# Patient Record
Sex: Female | Born: 1955
Health system: Southern US, Community
[De-identification: ages and names within clinical notes are randomized; demographics above are authoritative.]

## PROBLEM LIST (undated history)

## (undated) DIAGNOSIS — E063 Autoimmune thyroiditis: Secondary | ICD-10-CM

## (undated) DIAGNOSIS — M069 Rheumatoid arthritis, unspecified: Secondary | ICD-10-CM

## (undated) DIAGNOSIS — E039 Hypothyroidism, unspecified: Secondary | ICD-10-CM

## (undated) DIAGNOSIS — I8390 Asymptomatic varicose veins of unspecified lower extremity: Secondary | ICD-10-CM

## (undated) DIAGNOSIS — E7800 Pure hypercholesterolemia, unspecified: Secondary | ICD-10-CM

## (undated) DIAGNOSIS — R011 Cardiac murmur, unspecified: Secondary | ICD-10-CM

## (undated) DIAGNOSIS — K219 Gastro-esophageal reflux disease without esophagitis: Secondary | ICD-10-CM

## (undated) DIAGNOSIS — D67 Hereditary factor IX deficiency: Secondary | ICD-10-CM

## (undated) DIAGNOSIS — I10 Essential (primary) hypertension: Secondary | ICD-10-CM

## (undated) DIAGNOSIS — M1990 Unspecified osteoarthritis, unspecified site: Secondary | ICD-10-CM

## (undated) DIAGNOSIS — J439 Emphysema, unspecified: Secondary | ICD-10-CM

## (undated) DIAGNOSIS — R0600 Dyspnea, unspecified: Secondary | ICD-10-CM

## (undated) HISTORY — PX: ENDOSCOPIC VEIN LASER TREATMENT: SHX1508

## (undated) HISTORY — PX: ELBOW SURGERY: SHX618

## (undated) HISTORY — PX: APPENDECTOMY: SHX54

## (undated) HISTORY — PX: HAND SURGERY: SHX662

## (undated) HISTORY — PX: TUBAL LIGATION: SHX77

## (undated) HISTORY — PX: CHOLECYSTECTOMY: SHX55

## (undated) HISTORY — PX: HEEL SPUR SURGERY: SHX665

## (undated) HISTORY — PX: EXPLORATORY LAPAROTOMY: SUR591

## (undated) HISTORY — PX: TONSILLECTOMY: SUR1361

---

## 1998-08-07 ENCOUNTER — Other Ambulatory Visit: Admission: RE | Admit: 1998-08-07 | Discharge: 1998-08-07 | Payer: Self-pay | Admitting: Obstetrics & Gynecology

## 2006-02-12 ENCOUNTER — Encounter (INDEPENDENT_AMBULATORY_CARE_PROVIDER_SITE_OTHER): Payer: Self-pay | Admitting: Interventional Cardiology

## 2006-02-12 ENCOUNTER — Inpatient Hospital Stay (HOSPITAL_COMMUNITY): Admission: EM | Admit: 2006-02-12 | Discharge: 2006-02-14 | Payer: Self-pay | Admitting: *Deleted

## 2006-02-18 ENCOUNTER — Ambulatory Visit: Payer: Self-pay | Admitting: Family Medicine

## 2006-04-02 ENCOUNTER — Ambulatory Visit: Payer: Self-pay | Admitting: Family Medicine

## 2006-06-13 ENCOUNTER — Ambulatory Visit: Payer: Self-pay | Admitting: Family Medicine

## 2006-10-10 ENCOUNTER — Ambulatory Visit: Payer: Self-pay | Admitting: Internal Medicine

## 2007-03-25 ENCOUNTER — Ambulatory Visit: Payer: Self-pay | Admitting: Family Medicine

## 2007-04-06 ENCOUNTER — Ambulatory Visit: Payer: Self-pay | Admitting: Family Medicine

## 2007-08-11 ENCOUNTER — Telehealth (INDEPENDENT_AMBULATORY_CARE_PROVIDER_SITE_OTHER): Payer: Self-pay | Admitting: *Deleted

## 2007-08-18 DIAGNOSIS — E039 Hypothyroidism, unspecified: Secondary | ICD-10-CM | POA: Insufficient documentation

## 2007-08-20 ENCOUNTER — Ambulatory Visit: Payer: Self-pay | Admitting: Family Medicine

## 2007-08-27 ENCOUNTER — Telehealth (INDEPENDENT_AMBULATORY_CARE_PROVIDER_SITE_OTHER): Payer: Self-pay | Admitting: Nurse Practitioner

## 2007-10-01 ENCOUNTER — Encounter (INDEPENDENT_AMBULATORY_CARE_PROVIDER_SITE_OTHER): Payer: Self-pay | Admitting: Nurse Practitioner

## 2007-10-01 ENCOUNTER — Ambulatory Visit: Payer: Self-pay | Admitting: Family Medicine

## 2007-10-01 LAB — CONVERTED CEMR LAB
Alkaline Phosphatase: 95 units/L (ref 39–117)
BUN: 13 mg/dL (ref 6–23)
CO2: 24 meq/L (ref 19–32)
Calcium: 8.9 mg/dL (ref 8.4–10.5)
Creatinine, Ser: 0.66 mg/dL (ref 0.40–1.20)
HCT: 42.4 % (ref 36.0–46.0)
LDL Cholesterol: 100 mg/dL — ABNORMAL HIGH (ref 0–99)
Lymphocytes Relative: 17 % (ref 12–46)
Lymphs Abs: 1.2 10*3/uL (ref 0.7–3.3)
Neutro Abs: 5.2 10*3/uL (ref 1.7–7.7)
Neutrophils Relative %: 72 % (ref 43–77)
Platelets: 227 10*3/uL (ref 150–400)
Potassium: 4.4 meq/L (ref 3.5–5.3)
Total Bilirubin: 0.4 mg/dL (ref 0.3–1.2)
Total CHOL/HDL Ratio: 4.2
Total Protein: 7.5 g/dL (ref 6.0–8.3)
VLDL: 45 mg/dL — ABNORMAL HIGH (ref 0–40)
WBC: 7.2 10*3/uL (ref 4.0–10.5)

## 2007-10-02 ENCOUNTER — Encounter (INDEPENDENT_AMBULATORY_CARE_PROVIDER_SITE_OTHER): Payer: Self-pay | Admitting: Nurse Practitioner

## 2007-10-29 ENCOUNTER — Ambulatory Visit: Payer: Self-pay | Admitting: Vascular Surgery

## 2007-10-29 ENCOUNTER — Emergency Department (HOSPITAL_COMMUNITY): Admission: EM | Admit: 2007-10-29 | Discharge: 2007-10-29 | Payer: Self-pay | Admitting: Emergency Medicine

## 2007-10-29 ENCOUNTER — Encounter (INDEPENDENT_AMBULATORY_CARE_PROVIDER_SITE_OTHER): Payer: Self-pay | Admitting: Emergency Medicine

## 2007-10-29 LAB — CONVERTED CEMR LAB
BUN: 14 mg/dL
CO2: 27 meq/L
Chloride: 102 meq/L
Glucose, Bld: 86 mg/dL
Platelets: 247 10*3/uL
Sodium: 133 meq/L

## 2007-11-02 ENCOUNTER — Ambulatory Visit: Payer: Self-pay | Admitting: Nurse Practitioner

## 2007-11-02 DIAGNOSIS — I8310 Varicose veins of unspecified lower extremity with inflammation: Secondary | ICD-10-CM | POA: Insufficient documentation

## 2007-11-03 LAB — CONVERTED CEMR LAB: TSH: 73.884 microintl units/mL — ABNORMAL HIGH (ref 0.350–5.50)

## 2007-11-04 ENCOUNTER — Telehealth (INDEPENDENT_AMBULATORY_CARE_PROVIDER_SITE_OTHER): Payer: Self-pay | Admitting: Nurse Practitioner

## 2007-12-07 ENCOUNTER — Encounter (INDEPENDENT_AMBULATORY_CARE_PROVIDER_SITE_OTHER): Payer: Self-pay | Admitting: *Deleted

## 2008-05-04 ENCOUNTER — Ambulatory Visit: Payer: Self-pay | Admitting: Internal Medicine

## 2008-05-04 ENCOUNTER — Ambulatory Visit (HOSPITAL_COMMUNITY): Admission: RE | Admit: 2008-05-04 | Discharge: 2008-05-04 | Payer: Self-pay | Admitting: Internal Medicine

## 2008-05-04 DIAGNOSIS — E669 Obesity, unspecified: Secondary | ICD-10-CM | POA: Insufficient documentation

## 2008-05-04 DIAGNOSIS — M25529 Pain in unspecified elbow: Secondary | ICD-10-CM | POA: Insufficient documentation

## 2008-05-04 DIAGNOSIS — J309 Allergic rhinitis, unspecified: Secondary | ICD-10-CM | POA: Insufficient documentation

## 2008-05-04 DIAGNOSIS — I10 Essential (primary) hypertension: Secondary | ICD-10-CM | POA: Insufficient documentation

## 2008-05-04 DIAGNOSIS — R32 Unspecified urinary incontinence: Secondary | ICD-10-CM | POA: Insufficient documentation

## 2008-05-04 DIAGNOSIS — K219 Gastro-esophageal reflux disease without esophagitis: Secondary | ICD-10-CM | POA: Insufficient documentation

## 2008-05-04 LAB — CONVERTED CEMR LAB: Monocyte/Macrophage: 8 % — ABNORMAL LOW (ref 50–90)

## 2008-05-05 ENCOUNTER — Ambulatory Visit: Payer: Self-pay | Admitting: Internal Medicine

## 2008-05-05 ENCOUNTER — Telehealth (INDEPENDENT_AMBULATORY_CARE_PROVIDER_SITE_OTHER): Payer: Self-pay | Admitting: *Deleted

## 2008-05-06 ENCOUNTER — Telehealth (INDEPENDENT_AMBULATORY_CARE_PROVIDER_SITE_OTHER): Payer: Self-pay | Admitting: *Deleted

## 2008-05-06 ENCOUNTER — Ambulatory Visit: Payer: Self-pay | Admitting: Internal Medicine

## 2008-05-11 ENCOUNTER — Encounter (INDEPENDENT_AMBULATORY_CARE_PROVIDER_SITE_OTHER): Payer: Self-pay | Admitting: Internal Medicine

## 2008-05-18 ENCOUNTER — Ambulatory Visit: Payer: Self-pay | Admitting: Internal Medicine

## 2008-05-19 ENCOUNTER — Ambulatory Visit: Payer: Self-pay | Admitting: Internal Medicine

## 2008-05-23 LAB — CONVERTED CEMR LAB
Basophils Relative: 0 % (ref 0–1)
CRP: 0.4 mg/dL (ref <0.6)
Eosinophils Absolute: 0.2 10*3/uL (ref 0.0–0.7)
Lymphocytes Relative: 16 % (ref 12–46)
Lymphs Abs: 1.2 10*3/uL (ref 0.7–4.0)
MCV: 105.1 fL — ABNORMAL HIGH (ref 78.0–100.0)
Neutrophils Relative %: 73 % (ref 43–77)
Platelets: 219 10*3/uL (ref 150–400)
RBC: 3.7 M/uL — ABNORMAL LOW (ref 3.87–5.11)
RDW: 15.1 % (ref 11.5–15.5)
Rhuematoid fact SerPl-aCnc: <20 intl units/mL (ref 0–20)
Uric Acid, Serum: 3.9 mg/dL (ref 2.4–7.0)

## 2008-06-20 ENCOUNTER — Encounter (INDEPENDENT_AMBULATORY_CARE_PROVIDER_SITE_OTHER): Payer: Self-pay | Admitting: Internal Medicine

## 2008-06-23 ENCOUNTER — Encounter: Admission: RE | Admit: 2008-06-23 | Discharge: 2008-06-23 | Payer: Self-pay | Admitting: Rheumatology

## 2008-07-05 ENCOUNTER — Encounter (INDEPENDENT_AMBULATORY_CARE_PROVIDER_SITE_OTHER): Payer: Self-pay | Admitting: Internal Medicine

## 2008-07-06 ENCOUNTER — Encounter (INDEPENDENT_AMBULATORY_CARE_PROVIDER_SITE_OTHER): Payer: Self-pay | Admitting: Internal Medicine

## 2008-07-08 ENCOUNTER — Ambulatory Visit: Payer: Self-pay | Admitting: Internal Medicine

## 2008-07-08 DIAGNOSIS — E538 Deficiency of other specified B group vitamins: Secondary | ICD-10-CM | POA: Insufficient documentation

## 2008-07-08 DIAGNOSIS — M069 Rheumatoid arthritis, unspecified: Secondary | ICD-10-CM | POA: Insufficient documentation

## 2008-07-21 ENCOUNTER — Ambulatory Visit: Payer: Self-pay | Admitting: Internal Medicine

## 2008-07-21 DIAGNOSIS — R609 Edema, unspecified: Secondary | ICD-10-CM | POA: Insufficient documentation

## 2008-07-21 LAB — CONVERTED CEMR LAB
Glucose, Urine, Semiquant: NEGATIVE
Nitrite: NEGATIVE
Specific Gravity, Urine: 1.02
pH: 7

## 2008-07-22 LAB — CONVERTED CEMR LAB
ALT: 12 units/L (ref 0–35)
AST: 13 units/L (ref 0–37)
Alkaline Phosphatase: 72 units/L (ref 39–117)
CO2: 23 meq/L (ref 19–32)
Calcium: 8.5 mg/dL (ref 8.4–10.5)
Creatinine, Ser: 0.74 mg/dL (ref 0.40–1.20)
Eosinophils Relative: 2 % (ref 0–5)
HCT: 40.5 % (ref 36.0–46.0)
Lymphocytes Relative: 20 % (ref 12–46)
Lymphs Abs: 1.7 10*3/uL (ref 0.7–4.0)
Monocytes Absolute: 0.7 10*3/uL (ref 0.1–1.0)
Monocytes Relative: 8 % (ref 3–12)
Neutro Abs: 5.7 10*3/uL (ref 1.7–7.7)
Neutrophils Relative %: 70 % (ref 43–77)
Platelets: 207 10*3/uL (ref 150–400)
RDW: 15.8 % — ABNORMAL HIGH (ref 11.5–15.5)
TSH: 37.647 microintl units/mL — ABNORMAL HIGH (ref 0.350–4.50)
Total Protein: 6.8 g/dL (ref 6.0–8.3)
WBC: 8.3 10*3/uL (ref 4.0–10.5)

## 2008-07-25 ENCOUNTER — Encounter (INDEPENDENT_AMBULATORY_CARE_PROVIDER_SITE_OTHER): Payer: Self-pay | Admitting: Internal Medicine

## 2008-08-03 ENCOUNTER — Encounter (INDEPENDENT_AMBULATORY_CARE_PROVIDER_SITE_OTHER): Payer: Self-pay | Admitting: Internal Medicine

## 2008-08-09 ENCOUNTER — Ambulatory Visit: Payer: Self-pay | Admitting: Internal Medicine

## 2008-08-23 ENCOUNTER — Encounter (INDEPENDENT_AMBULATORY_CARE_PROVIDER_SITE_OTHER): Payer: Self-pay | Admitting: Internal Medicine

## 2008-08-30 ENCOUNTER — Encounter (INDEPENDENT_AMBULATORY_CARE_PROVIDER_SITE_OTHER): Payer: Self-pay | Admitting: Internal Medicine

## 2008-09-06 ENCOUNTER — Ambulatory Visit: Payer: Self-pay | Admitting: Internal Medicine

## 2008-09-06 DIAGNOSIS — R5381 Other malaise: Secondary | ICD-10-CM | POA: Insufficient documentation

## 2008-09-06 DIAGNOSIS — R5383 Other fatigue: Secondary | ICD-10-CM

## 2008-09-28 ENCOUNTER — Encounter (INDEPENDENT_AMBULATORY_CARE_PROVIDER_SITE_OTHER): Payer: Self-pay | Admitting: Internal Medicine

## 2008-10-11 ENCOUNTER — Ambulatory Visit: Payer: Self-pay | Admitting: Internal Medicine

## 2008-10-12 ENCOUNTER — Encounter (INDEPENDENT_AMBULATORY_CARE_PROVIDER_SITE_OTHER): Payer: Self-pay | Admitting: Internal Medicine

## 2008-10-13 ENCOUNTER — Encounter (INDEPENDENT_AMBULATORY_CARE_PROVIDER_SITE_OTHER): Payer: Self-pay | Admitting: Internal Medicine

## 2008-10-17 ENCOUNTER — Telehealth (INDEPENDENT_AMBULATORY_CARE_PROVIDER_SITE_OTHER): Payer: Self-pay | Admitting: *Deleted

## 2008-10-17 LAB — CONVERTED CEMR LAB
BUN: 12 mg/dL (ref 6–23)
Calcium: 8.9 mg/dL (ref 8.4–10.5)
Creatinine, Ser: 0.6 mg/dL (ref 0.40–1.20)
Free T4: 1.39 ng/dL (ref 0.89–1.80)
Glucose, Bld: 98 mg/dL (ref 70–99)
Potassium: 3.9 meq/L (ref 3.5–5.3)

## 2008-11-08 ENCOUNTER — Encounter (INDEPENDENT_AMBULATORY_CARE_PROVIDER_SITE_OTHER): Payer: Self-pay | Admitting: Internal Medicine

## 2008-11-09 ENCOUNTER — Encounter: Admission: RE | Admit: 2008-11-09 | Discharge: 2008-11-09 | Payer: Self-pay | Admitting: Rheumatology

## 2008-11-09 ENCOUNTER — Encounter (INDEPENDENT_AMBULATORY_CARE_PROVIDER_SITE_OTHER): Payer: Self-pay | Admitting: Internal Medicine

## 2008-12-21 ENCOUNTER — Ambulatory Visit: Payer: Self-pay | Admitting: Internal Medicine

## 2008-12-21 DIAGNOSIS — R6882 Decreased libido: Secondary | ICD-10-CM | POA: Insufficient documentation

## 2009-01-05 ENCOUNTER — Encounter (INDEPENDENT_AMBULATORY_CARE_PROVIDER_SITE_OTHER): Payer: Self-pay | Admitting: Internal Medicine

## 2009-01-06 ENCOUNTER — Encounter: Admission: RE | Admit: 2009-01-06 | Discharge: 2009-01-06 | Payer: Self-pay | Admitting: Rheumatology

## 2009-01-17 ENCOUNTER — Ambulatory Visit: Payer: Self-pay | Admitting: Internal Medicine

## 2009-01-19 ENCOUNTER — Encounter (INDEPENDENT_AMBULATORY_CARE_PROVIDER_SITE_OTHER): Payer: Self-pay | Admitting: Internal Medicine

## 2009-01-19 LAB — CONVERTED CEMR LAB
TSH: 0.022 microintl units/mL — ABNORMAL LOW (ref 0.350–4.50)
Vitamin B-12: 216 pg/mL (ref 211–911)

## 2009-02-06 ENCOUNTER — Encounter (INDEPENDENT_AMBULATORY_CARE_PROVIDER_SITE_OTHER): Payer: Self-pay | Admitting: Internal Medicine

## 2009-02-16 ENCOUNTER — Ambulatory Visit: Payer: Self-pay | Admitting: Internal Medicine

## 2009-03-07 ENCOUNTER — Encounter (INDEPENDENT_AMBULATORY_CARE_PROVIDER_SITE_OTHER): Payer: Self-pay | Admitting: Internal Medicine

## 2009-03-20 ENCOUNTER — Telehealth (INDEPENDENT_AMBULATORY_CARE_PROVIDER_SITE_OTHER): Payer: Self-pay | Admitting: Internal Medicine

## 2009-03-20 ENCOUNTER — Ambulatory Visit: Payer: Self-pay | Admitting: Internal Medicine

## 2009-03-21 ENCOUNTER — Encounter (INDEPENDENT_AMBULATORY_CARE_PROVIDER_SITE_OTHER): Payer: Self-pay | Admitting: Internal Medicine

## 2009-04-17 ENCOUNTER — Encounter (INDEPENDENT_AMBULATORY_CARE_PROVIDER_SITE_OTHER): Payer: Self-pay | Admitting: Internal Medicine

## 2009-04-21 ENCOUNTER — Ambulatory Visit: Payer: Self-pay | Admitting: Internal Medicine

## 2009-06-20 ENCOUNTER — Ambulatory Visit: Payer: Self-pay | Admitting: Internal Medicine

## 2009-06-21 ENCOUNTER — Encounter (INDEPENDENT_AMBULATORY_CARE_PROVIDER_SITE_OTHER): Payer: Self-pay | Admitting: Internal Medicine

## 2009-06-21 LAB — CONVERTED CEMR LAB
ALT: 12 units/L (ref 0–35)
Albumin: 4.1 g/dL (ref 3.5–5.2)
Alkaline Phosphatase: 114 units/L (ref 39–117)
Basophils Relative: 0 % (ref 0–1)
CO2: 22 meq/L (ref 19–32)
Calcium: 9.6 mg/dL (ref 8.4–10.5)
Creatinine, Ser: 0.53 mg/dL (ref 0.40–1.20)
Eosinophils Absolute: 0.1 10*3/uL (ref 0.0–0.7)
Free T4: 2.27 ng/dL — ABNORMAL HIGH (ref 0.80–1.80)
HCT: 45.9 % (ref 36.0–46.0)
Lymphocytes Relative: 21 % (ref 12–46)
MCV: 97.9 fL (ref 78.0–100.0)
Neutrophils Relative %: 64 % (ref 43–77)
Potassium: 4.3 meq/L (ref 3.5–5.3)
RDW: 14.6 % (ref 11.5–15.5)
Sed Rate: 11 mm/hr (ref 0–22)
TSH: 0.022 microintl units/mL — ABNORMAL LOW (ref 0.350–4.500)
Total Bilirubin: 0.6 mg/dL (ref 0.3–1.2)
WBC: 6 10*3/uL (ref 4.0–10.5)

## 2009-06-22 ENCOUNTER — Encounter: Admission: RE | Admit: 2009-06-22 | Discharge: 2009-06-22 | Payer: Self-pay | Admitting: Rheumatology

## 2009-08-22 ENCOUNTER — Encounter (INDEPENDENT_AMBULATORY_CARE_PROVIDER_SITE_OTHER): Payer: Self-pay | Admitting: Internal Medicine

## 2009-09-19 ENCOUNTER — Emergency Department (HOSPITAL_COMMUNITY): Admission: EM | Admit: 2009-09-19 | Discharge: 2009-09-19 | Payer: Self-pay | Admitting: Emergency Medicine

## 2009-10-30 ENCOUNTER — Emergency Department (HOSPITAL_COMMUNITY): Admission: EM | Admit: 2009-10-30 | Discharge: 2009-10-31 | Payer: Self-pay | Admitting: Emergency Medicine

## 2011-01-15 NOTE — Progress Notes (Signed)
Summary: [name]   Imported By: Lutricia Horsfall LPN 16/09/9603 54:09:81  _____________________________________________________________________  External Attachment:    Type:   Image     Comment:   External Document

## 2011-03-21 LAB — DIFFERENTIAL
Eosinophils Absolute: 0 10*3/uL (ref 0.0–0.7)
Eosinophils Relative: 0 % (ref 0–5)
Lymphocytes Relative: 9 % — ABNORMAL LOW (ref 12–46)
Lymphs Abs: 0.8 10*3/uL (ref 0.7–4.0)
Monocytes Absolute: 1.1 10*3/uL — ABNORMAL HIGH (ref 0.1–1.0)

## 2011-03-21 LAB — LIPASE, BLOOD: Lipase: 27 U/L (ref 11–59)

## 2011-03-21 LAB — HEPATIC FUNCTION PANEL
ALT: 15 U/L (ref 0–35)
Alkaline Phosphatase: 71 U/L (ref 39–117)
Bilirubin, Direct: 0.2 mg/dL (ref 0.0–0.3)
Indirect Bilirubin: 0.7 mg/dL (ref 0.3–0.9)
Total Bilirubin: 0.9 mg/dL (ref 0.3–1.2)

## 2011-03-21 LAB — BASIC METABOLIC PANEL
BUN: 14 mg/dL (ref 6–23)
Chloride: 96 mEq/L (ref 96–112)
GFR calc non Af Amer: >60 mL/min (ref >60)
Glucose, Bld: 121 mg/dL — ABNORMAL HIGH (ref 70–99)
Potassium: 2.3 mEq/L — CL (ref 3.5–5.1)
Sodium: 129 mEq/L — ABNORMAL LOW (ref 135–145)

## 2011-03-21 LAB — URINALYSIS, ROUTINE W REFLEX MICROSCOPIC: Glucose, UA: NEGATIVE mg/dL

## 2011-03-21 LAB — CBC
HCT: 45.4 % (ref 36.0–46.0)
Hemoglobin: 15.8 g/dL — ABNORMAL HIGH (ref 12.0–15.0)
MCV: 94.3 fL (ref 78.0–100.0)
Platelets: 146 10*3/uL — ABNORMAL LOW (ref 150–400)
WBC: 8.8 10*3/uL (ref 4.0–10.5)

## 2011-03-21 LAB — URINE MICROSCOPIC-ADD ON

## 2011-03-21 LAB — URINE CULTURE: Colony Count: >=100000

## 2011-05-03 NOTE — Discharge Summary (Signed)
[name], [name]              ACCOUNT NO.:  [id number]   MEDICAL RECORD NO.:  [id number]          PATIENT TYPE:  INP   LOCATION:  2620                         FACILITY:  [facility]   PHYSICIAN:  [name] Ridgel, MD  DATE OF BIRTH:  [birth year]   DATE OF ADMISSION:  02/11/2006  DATE OF DISCHARGE:  02/14/2006                                 DISCHARGE SUMMARY   DISCHARGE DIAGNOSES:  1.  Presumed pericarditis.  Patient was admitted and ruled out for      myocardial infarction on cardiac enzymes.  She was, however, noted to      have a very slight PR depression on her electrocardiogram with an      elevated erythrocyte sedimentation rate (ESR), and no other source for      her chest pain other than a possible pericardial inflammation.  The pain      responded favorably to antiinflammatory medications and, therefore,      without other obvious causes, this is presumed mild pericarditis.  The      patient was sent home to be treated with a total of seven days of Motrin      therapy 400 mg every six hours.  She was prophylaxed with Omeprazole for      gastrointestinal upset and instructed to take her medications with food.  2.  Enlarged thyroid gland with thyroid function studies consistent with      hypothyroidism.  The patient was started on Synthroid.  An ultrasound of      her thyroid was completed that showed no obvious nodularity and,      therefore, it is presumed that this is a primary hypothyroidism.  I have      recommended that she establish a primary care physician and follow up as      an outpatient for further workup of her hypothyroidism and titration of      her medication.  3.  Central lobular emphysema.  The patient was treated with nebulizers and      a flutter valve.  She had good relief of her congestion and a sensation      of shortness of breath.  It was recommended that she stop smoking.  4.  Lack of primary care physician.  The patient was seen by Case Management    and given a referral for [facility].  She needs to establish with a      primary care practitioner to follow up on her symptomatology and to      assure that someone follows her hypothyroidism.   CHIEF COMPLAINT AT TIME OF ADMISSION:  Severe chest pain.   MEDICATIONS ON DISCHARGE:  1.  Motrin 400 mg every 6 hours with food for 5 more days then stop.  2.  Omeprazole 20 mg once daily.  3.  Synthroid 25 mcg once daily.   She is instructed to establish a primary care physician of her choice.   HISTORY OF PRESENT ILLNESS:  The patient is a very pleasant, 55 year old  white female, who is not following with a physician.  She does have  a  diagnosis of hypertension but has been off medications for at least one  year.  She is obese and does smoke cigarettes.  The patient presented to the  emergency room with severe chest pain that was relieved with leaning  forward.  It was located in the right lower rib area.  It had some vague  radiation to her neck but was prominent when she would lie down and, in  fact, she refused to lie down the first 24 hours.  With treatment with  Toradol, within 24 hours she was able to lean slightly backwards.  With  further treatment with NSAIDs, by the time of discharge she was able to lie  almost flat.  As stated, workup with cardiac markers showed no obvious  myocardial infarction.  Her D-dimer was elevated at 0.55 and therefore a  chest CT was completed, which was negative for pulmonary embolus.  A two-D  echo was completed, which showed a trivial posterior pericardial effusion  that was mentioned in the formal report but not initially noted.  She had an  ejection fraction of 75 to 80%.  There were no wall motion abnormalities.  Her ESR was noted to be elevated and with the constellation of symptoms of  elevated ESR, her chest pain relieved by sitting forward relieved with  NSAIDs, the diagnosis of mild pericarditis was made.  I did review this case  with  Cardiology, who concurred and did not feel that a consult was necessary  at this time.  Should her symptoms continue or become worse then a  Cardiology consult obviously would have been obtained.  The patient  responded quite favorably to further NSAID treatment and on the day of  discharge, as stated, was able to lie flat.   The patient's hospital course was further complicated by the notation of an  enlarged thyroid gland on chest CT.  Ultrasound of the thyroid showed  homogenous enlargement without obvious nodularity.  Her thyroid function  studies returned showing frank hypothyroidism with a high TSH and a low T4.  She therefore was started on Synthroid.  A GI source for her discomfort was  evaluated by obtaining an ultrasound of the abdomen, which showed only mild  hepatosplenomegaly without any obvious gallbladder disease and no pancreas  disease was noted.   On the day of discharge since the patient had significant resolution of her  symptoms with NSAID treatment, the diagnosis of pericarditis was made.   PHYSICAL EXAMINATION ON DISCHARGE:  VITAL SIGNS:  She was afebrile.  Her  temperature was 98.1, blood pressure 107/65, pulse was 64, respirations were  14, saturations were 94%.  GENERAL:  This is an obese white female in no acute distress.  HEENT:  She is normocephalic and atraumatic.  Pupils are equal, round, and  reactive to light.  Extraocular muscles were intact.  Mucous membranes were  moist.  NECK:  Supple.  There was no JVD, no lymph glands, no carotid bruits.  She  did have thyromegaly that was smooth.  CHEST:  Decreased but clear without rhonchi, rales, or wheezes.  CARDIOVASCULAR:  Regular rate and rhythm, positive S1 and S2, no S3 or S4,  no murmurs, rubs, or gallops.  ABDOMEN:  Obese, nontender, nondistended.  She had only trace tenderness to  palpation in the epigastric region. EXTREMITIES:  No clubbing, cyanosis, or edema.  NEUROLOGIC:  Cranial nerves II-XII  were intact.  Power was 5/5.  Deep tendon  reflexes were 2+.  PERTINENT LABORATORY VALUES:  Her ESR was slightly elevated at 62, her TSH  was high at 68.968, her free T4 was low at 0.55, her total T3 was 71.9.  Her  cardiac markers were within normal limits.  Her D-dimer was slightly  elevated at 0.55.  Her discharging CBC was 6.1 with a hemoglobin of 11.3,  hematocrit 32.8, platelets of 221.  Her BUN and creatinine at discharge were  13 and 0.8 respectively.   At this time, the patient is deemed stable for discharge to follow up as an  outpatient.  She was encouraged to please make an appointment in the  following week with [facility] to reevaluate her symptoms and to assure  that her Synthroid was being taken and up titrated appropriately.   At the time of discharge, the patient is stable.   DISPOSITION:  To home.      [name] Ridgel, MD  Electronically Signed    MLT/MEDQ  D:  02/15/2006  T:  02/16/2006  Job:  [id number]   cc:   [facility]

## 2011-05-03 NOTE — H&P (Signed)
NAMESELIN, [name] NO.:  [id number]   MEDICAL RECORD NO.:  [id number]          PATIENT TYPE:  INP   LOCATION:  1823                         FACILITY:  [facility]   PHYSICIAN:  Deirdre Peer. Polite, M.D. DATE OF BIRTH:  02-20-1956   DATE OF ADMISSION:  02/11/2006  DATE OF DISCHARGE:                                HISTORY & PHYSICAL   CHIEF COMPLAINT:  Chest pain,.   HISTORY OF PRESENT ILLNESS:  This 55 year old female with a history of  hypertension has been off medicines for one year, obesity, tobacco abuse.  She comes to the ED with complaints of chest pain.  According to the  patient, the pain has been going on since about lunch with her daughter-in-  law Monday afternoon.  The pain has been going on consistently and described  as an 8/10 on the left side of the chest and some vague radiation towards  her neck.  The patient states symptoms got worse by taking a deep breath.  The patient states her symptoms also worsened by trying to lay back.  The  pain is essentially at the point now where she essentially stays in a seated  position.  The patient does admit to tobacco abuse.  She denies any prior  history of cardiac disease.  She states that the symptoms she has now feels  similar to the way she felt when she had pneumonia back in 2005.  Because of  the above symptoms, the patient presented to the emergency department for  evaluation.  The patient is hemodynamically stable and afebrile.  CBC is  within normal limits.  Mildly elevated D-dimer at 0.55.  CT of the chest is  negative for PE.  It did show lower lobe atelectasis  and mild peribronchial  thickening.  [facility] Hospital[facility] called for further evaluation.  At the time  of my evaluation, the patient is still in discomfort in her chest and it is  easily reproducible.  The patient denies any trauma to her chest wall, any  lifting or no obvious rash.  Again denies any history of any coronary artery  disease.  Denies  any fever, chills or productive cough.  Because of the  persistent nature of the patient's chest pain admission is deemed necessary  for further evaluation and treatment.   PAST MEDICAL HISTORY:  As stated above.   MEDICATIONS:  None on admission.   SOCIAL HISTORY:  Positive for tobacco, one pack per day.  No alcohol and no  drugs.   PAST SURGICAL HISTORY:  Appendectomy, tubal ligation and tonsils removed.   ALLERGIES:  She reports an allergy to penicillin which causes a rash as well  as Tylox.   FAMILY HISTORY:  Mother deceased secondary to kidney failure.  Father had  lung cancer.  No brothers or sisters.   REVIEW OF SYSTEMS:  As stated in the HPI.   PHYSICAL EXAMINATION:  GENERAL:  Alert and oriented x3 in moderate distress  secondary to chest pain.  HEENT:  Within normal limits.  CHEST:  Moderate air movement without rales or rhonchi.  CARDIOVASCULAR:  Regular.  No S3.  The patient has reproducible chest pain  over the sternal area.  ABDOMEN:  Abdominal exam is limited as the patient stated that she was  unable to lay in a recumbent position secondary to increase in her chest  pain and a sensation of not being able to breathe.  EXTREMITIES:  No edema.  NEUROLOGICAL:  Exam is nonfocal.   LABORATORY DATA:  CBC:  White count 10.3, hemoglobin 12, MCV 37.3, platelets  260,000.  Neutrophil count 79%.  D-dimer is 0.55.  Myoglobin 53, CK-MB 1.1.  Troponin less than 0.05.  B-MET:  Sodium 128, potassium 3.8, chloride 97,  carbon dioxide 26, BUN 8, creatinine 0.8.  AST and ALT 15 and 13,  respectively.  Chest x-ray:  Bilateral lower lung atelectasis, right greater  than left, mild peribronchial thickening.  CT of the chest:  Ectatic  ascending aorta.  No PE, no dissection.  Enlarged thyroid.  Mild  hepatosplenomegaly and 8 mm right lower lobe lung nodule.   ASSESSMENT:  1.  Chest pain, reproducible with palpation.  Please note limited exam      secondary to discomfort in lying  position.  2.  History of hypertension.  Please note off medicines x1 year.  3.  History of tobacco abuse.  4.  Obesity.   PLAN:  1.  Recommend patient be admitted to telemetry floor bed.  2.  The patient will have serial cardiac enzymes.  3.  Will provide analgesia.  4.  Consider further evaluation, i.e., musculoskeletal versus cardiac.      Currently seems somewhat atypical for cardiac at this time.  5.  Will determine next step in evaluation pending on the results of the      above studies.      Deirdre Peer. Polite, M.D.  Electronically Signed     RDP/MEDQ  D:  02/12/2006  T:  02/12/2006  Job:  [id number]

## 2011-09-24 LAB — CBC
MCHC: 33.8
Platelets: 247
RDW: 13.9

## 2011-09-24 LAB — POCT CARDIAC MARKERS
CKMB, poc: 5.4
Myoglobin, poc: 89
Operator id: 198171

## 2011-09-24 LAB — DIFFERENTIAL
Basophils Absolute: 0
Basophils Relative: 1
Lymphocytes Relative: 19
Monocytes Absolute: 0.5
Neutro Abs: 5.4
Neutrophils Relative %: 71

## 2011-09-24 LAB — I-STAT 8, (EC8 V) (CONVERTED LAB)
BUN: 14
Chloride: 102
Glucose, Bld: 86
Hemoglobin: 14.6
Operator id: 198171
pCO2, Ven: 43.4 — ABNORMAL LOW

## 2011-09-24 LAB — URINE MICROSCOPIC-ADD ON

## 2011-09-24 LAB — URINALYSIS, ROUTINE W REFLEX MICROSCOPIC
Nitrite: NEGATIVE
Specific Gravity, Urine: 1.013
Urobilinogen, UA: 0.2
pH: 7

## 2012-03-26 ENCOUNTER — Encounter (HOSPITAL_COMMUNITY): Payer: Self-pay | Admitting: Emergency Medicine

## 2012-03-26 ENCOUNTER — Emergency Department (HOSPITAL_COMMUNITY)
Admission: EM | Admit: 2012-03-26 | Discharge: 2012-03-27 | Disposition: A | Discharge status: Home / Self Care | Payer: Self-pay | Attending: Emergency Medicine | Admitting: Emergency Medicine

## 2012-03-26 ENCOUNTER — Emergency Department (HOSPITAL_COMMUNITY): Payer: Self-pay

## 2012-03-26 DIAGNOSIS — F17200 Nicotine dependence, unspecified, uncomplicated: Secondary | ICD-10-CM | POA: Insufficient documentation

## 2012-03-26 DIAGNOSIS — M069 Rheumatoid arthritis, unspecified: Secondary | ICD-10-CM | POA: Insufficient documentation

## 2012-03-26 DIAGNOSIS — M79643 Pain in unspecified hand: Secondary | ICD-10-CM

## 2012-03-26 DIAGNOSIS — M79609 Pain in unspecified limb: Secondary | ICD-10-CM | POA: Insufficient documentation

## 2012-03-26 DIAGNOSIS — W1809XA Striking against other object with subsequent fall, initial encounter: Secondary | ICD-10-CM | POA: Insufficient documentation

## 2012-03-26 HISTORY — DX: Rheumatoid arthritis, unspecified: M06.9

## 2012-03-26 MED ORDER — HYDROCODONE-ACETAMINOPHEN 5-325 MG PO TABS
1.0000 | ORAL_TABLET | Freq: Once | ORAL | Status: AC
  Administered 2012-03-26: 1 via ORAL
  Filled 2012-03-26: qty 1

## 2012-03-26 NOTE — ED Notes (Signed)
Patient states she fell Tuesday morning and her right hand has hurt ever since.

## 2012-03-26 NOTE — ED Provider Notes (Signed)
History   This chart was scribed for [employer]. Colon Branch, MD by Brooks Sailors. The patient was seen in room APA06/APA06. Patient's care was started at 2224.   CSN: [id number]  Arrival date & time 03/26/12  2224   First MD Initiated Contact with Patient 03/26/12 2333      Chief Complaint  Patient presents with  . Hand Injury    (Consider location/radiation/quality/duration/timing/severity/associated sxs/prior treatment) HPI  [name] is a 56 y.o. female who presents to the Emergency Department complaining of constant moderate right hand pain onset two days ago. Patient was getting out of bed when she fell bracing herself against the wall and hitting right hand. Patient has been taking Tylenol and Ibuprofen at home. Patient with history of arthritis and permanent deformity of right index finger. Patient with history of rheumatoid arthritis and thyroid disease.   No PCP   Past Medical History  Diagnosis Date  . Thyroid disease   . Rheumatoid arthritis     History reviewed. No pertinent past surgical history.  No family history on file.  History  Substance Use Topics  . Smoking status: Current Everyday Smoker -- 0.5 packs/day    Types: Cigarettes  . Smokeless tobacco: Not on file  . Alcohol Use: No    OB History    Grav Para Term Preterm Abortions TAB SAB Ect Mult Living                  Review of Systems  All other systems reviewed and are negative.  ROS: Statement: All systems negative except as marked or noted in the HPI; Constitutional: Negative for fever and chills. ; ; Eyes: Negative for eye pain, redness and discharge. ; ; ENMT: Negative for ear pain, hoarseness, nasal congestion, sinus pressure and sore throat. ; ; Cardiovascular: Negative for chest pain, palpitations, diaphoresis, dyspnea and peripheral edema. ; ; Respiratory: Negative for cough, wheezing and stridor. ; ; Gastrointestinal: Negative for nausea, vomiting, diarrhea, abdominal pain, blood in  stool, hematemesis, jaundice and rectal bleeding. . ; ; Genitourinary: Negative for dysuria, flank pain and hematuria. ; ; Musculoskeletal: Negative for back pain and neck pain. Negative for swelling and trauma.; ; Skin: Negative for pruritus, rash, abrasions, blisters, bruising and skin lesion.; ; Neuro: Negative for headache, lightheadedness and neck stiffness. Negative for weakness, altered level of consciousness , altered mental status, extremity weakness, paresthesias, involuntary movement, seizure and syncope.      Allergies  Oxycodone-acetaminophen; Penicillins; and Tylox  Home Medications  No current outpatient prescriptions on file.  BP 108/81  Pulse 72  Temp(Src) 98 F (36.7 C) (Oral)  Resp 20  Ht 5\' 4"  (1.626 m)  Wt 250 lb (113.399 kg)  BMI 42.91 kg/m2  SpO2 99%  Physical Exam  Nursing note and vitals reviewed. Constitutional: She is oriented to person, place, and time. She appears well-developed and well-nourished.  HENT:  Head: Normocephalic and atraumatic.        slightly thick tongue.  Eyes: Conjunctivae and EOM are normal. Pupils are equal, round, and reactive to light.       Bilateral periorbital edema, mild proptosis.   Neck: Normal range of motion. Neck supple.  Cardiovascular: Normal rate, regular rhythm and normal heart sounds.  Exam reveals no gallop and no friction rub.   No murmur heard. Pulmonary/Chest: Effort normal and breath sounds normal. No respiratory distress. She has no wheezes. She has no rales.  Abdominal: Soft. Bowel sounds are normal.  Musculoskeletal:  Swans neck deformity of theright index  digit. Swelling over metacarpals and dorsum of the right hand. Bilateral pulses present, Cap refill is brisk.   Neurological: She is alert and oriented to person, place, and time.  Skin: Skin is warm and dry.  Psychiatric: She has a normal mood and affect.    ED Course  Procedures (including critical care time) DIAGNOSTIC STUDIES: Oxygen  Saturation is 99% on room air, normal by my interpretation.    COORDINATION OF CARE: 11:35PM Patient informed of current plan for treatment and evaluation and agrees with plan at this time. Patient had hand x-rays taken upon arrival.   Dg Hand Complete Right  03/26/2012  *RADIOLOGY REPORT*  Clinical Data: Status post fall, with injury to the right hand and wrist.  History of severe arthritis and bony fusion at the second digit.  RIGHT HAND - COMPLETE 3+ VIEW  Comparison: None.  Findings: There is no evidence of acute fracture or dislocation. Marked chronic erosive change is noted at the second and third metacarpophalangeal joints.  There is also marked chronic deformity involving the second digit.  Findings are compatible with relatively focal erosive arthritis.  Remaining visualized joint spaces are preserved.  The carpal rows demonstrate multiple erosions, but are otherwise unremarkable in appearance.  Soft tissue swelling is noted about the metacarpophalangeal joints.  IMPRESSION:  1.  No evidence of acute fracture or dislocation. 2.  Marked chronic erosive change at the second and third metacarpophalangeal joints, and marked chronic deformity involving the second digit, with scattered erosions at the carpal rows. Findings compatible with relatively focal erosive arthritis; per clinical correlation, this reflects rheumatoid arthritis. 3.  Soft tissue swelling about the metacarpophalangeal joints.  Original Report Authenticated By: Tonia Ghent, M.D.       MDM  Patient with persistent right hand pain after a fall at home on Tuesday. X-rays without evidence of acute fracture. Patient was given analgesics and placed in a cock-up splint.Pt stable in ED with no significant deterioration in condition.The patient appears reasonably screened and/or stabilized for discharge and I doubt any other medical condition or other [facility] requiring further screening, evaluation, or treatment in the ED at this time prior  to discharge.  I personally performed the services described in this documentation, which was scribed in my presence. The recorded information has been reviewed and considered.   MDM Reviewed: nursing note and vitals Interpretation: x-ray           Nicoletta Dress. Colon Branch, MD 03/27/12 0100

## 2012-03-27 MED ORDER — HYDROCODONE-ACETAMINOPHEN 5-325 MG PO TABS: 1.0000 | ORAL_TABLET | ORAL | Status: AC | PRN

## 2012-03-27 NOTE — Discharge Instructions (Signed)
Your x-rays do not show any broken bones. Use ibuprofen and a stronger pain medicine for her discomfort. Use the splint for comfort. You may use either heat or ice, whichever feels better.

## 2012-09-01 ENCOUNTER — Other Ambulatory Visit (HOSPITAL_COMMUNITY): Payer: Self-pay | Admitting: Physician Assistant

## 2012-09-01 DIAGNOSIS — Z139 Encounter for screening, unspecified: Secondary | ICD-10-CM

## 2012-09-08 ENCOUNTER — Ambulatory Visit (HOSPITAL_COMMUNITY): Payer: Self-pay

## 2012-10-05 ENCOUNTER — Inpatient Hospital Stay (HOSPITAL_COMMUNITY): Admission: RE | Admit: 2012-10-05 | Payer: Self-pay | Source: Ambulatory Visit

## 2013-04-15 ENCOUNTER — Encounter (HOSPITAL_COMMUNITY): Payer: Self-pay | Admitting: Emergency Medicine

## 2013-04-15 ENCOUNTER — Emergency Department (HOSPITAL_COMMUNITY): Payer: Self-pay

## 2013-04-15 ENCOUNTER — Emergency Department (HOSPITAL_COMMUNITY)
Admission: EM | Admit: 2013-04-15 | Discharge: 2013-04-15 | Disposition: A | Discharge status: Home / Self Care | Payer: Self-pay | Attending: Emergency Medicine | Admitting: Emergency Medicine

## 2013-04-15 DIAGNOSIS — G8929 Other chronic pain: Secondary | ICD-10-CM | POA: Insufficient documentation

## 2013-04-15 DIAGNOSIS — Z91040 Latex allergy status: Secondary | ICD-10-CM | POA: Insufficient documentation

## 2013-04-15 DIAGNOSIS — M1711 Unilateral primary osteoarthritis, right knee: Secondary | ICD-10-CM

## 2013-04-15 DIAGNOSIS — Z8639 Personal history of other endocrine, nutritional and metabolic disease: Secondary | ICD-10-CM | POA: Insufficient documentation

## 2013-04-15 DIAGNOSIS — Z862 Personal history of diseases of the blood and blood-forming organs and certain disorders involving the immune mechanism: Secondary | ICD-10-CM | POA: Insufficient documentation

## 2013-04-15 DIAGNOSIS — Z880 Allergy status to penicillin: Secondary | ICD-10-CM | POA: Insufficient documentation

## 2013-04-15 DIAGNOSIS — IMO0002 Reserved for concepts with insufficient information to code with codable children: Secondary | ICD-10-CM | POA: Insufficient documentation

## 2013-04-15 DIAGNOSIS — M1710 Unilateral primary osteoarthritis, unspecified knee: Secondary | ICD-10-CM | POA: Insufficient documentation

## 2013-04-15 DIAGNOSIS — M549 Dorsalgia, unspecified: Secondary | ICD-10-CM | POA: Insufficient documentation

## 2013-04-15 DIAGNOSIS — M069 Rheumatoid arthritis, unspecified: Secondary | ICD-10-CM | POA: Insufficient documentation

## 2013-04-15 DIAGNOSIS — F17200 Nicotine dependence, unspecified, uncomplicated: Secondary | ICD-10-CM | POA: Insufficient documentation

## 2013-04-15 HISTORY — DX: Pure hypercholesterolemia, unspecified: E78.00

## 2013-04-15 MED ORDER — FENTANYL CITRATE 0.05 MG/ML IJ SOLN
50.0000 ug | Freq: Once | INTRAMUSCULAR | Status: AC
  Administered 2013-04-15: 50 ug via INTRAMUSCULAR
  Filled 2013-04-15: qty 2

## 2013-04-15 MED ORDER — ONDANSETRON HCL 4 MG PO TABS
4.0000 mg | ORAL_TABLET | Freq: Once | ORAL | Status: AC
  Administered 2013-04-15: 4 mg via ORAL
  Filled 2013-04-15: qty 1

## 2013-04-15 MED ORDER — HYDROCODONE-ACETAMINOPHEN 5-325 MG PO TABS
2.0000 | ORAL_TABLET | Freq: Once | ORAL | Status: AC
  Administered 2013-04-15: 2 via ORAL
  Filled 2013-04-15: qty 2

## 2013-04-15 MED ORDER — DEXAMETHASONE SODIUM PHOSPHATE 4 MG/ML IJ SOLN
8.0000 mg | Freq: Once | INTRAMUSCULAR | Status: AC
  Administered 2013-04-15: 8 mg via INTRAMUSCULAR
  Filled 2013-04-15: qty 1

## 2013-04-15 MED ORDER — DEXAMETHASONE 4 MG PO TABS: ORAL_TABLET | ORAL | Status: DC

## 2013-04-15 MED ORDER — HYDROCODONE-ACETAMINOPHEN 7.5-325 MG PO TABS: 1.0000 | ORAL_TABLET | ORAL | Status: DC | PRN

## 2013-04-15 MED ORDER — IBUPROFEN 800 MG PO TABS
800.0000 mg | ORAL_TABLET | Freq: Once | ORAL | Status: AC
  Administered 2013-04-15: 800 mg via ORAL
  Filled 2013-04-15: qty 1

## 2013-04-15 NOTE — ED Notes (Signed)
edp aware pain meds did not work.

## 2013-04-15 NOTE — ED Notes (Signed)
Pt c/o chronic r knee pain. Started throbbing yesterday. Got up this am and could not walk due to pain. Nausea due to pain. Pain from knee down to ankle per pt.

## 2013-04-15 NOTE — ED Provider Notes (Signed)
History     CSN: [id number]  Arrival date & time 04/15/13  0909   First MD Initiated Contact with Patient 04/15/13 [id number]      Chief Complaint  Patient presents with  . Knee Pain    (Consider location/radiation/quality/duration/timing/severity/associated sxs/prior treatment) Patient is a 57 y.o. female presenting with knee pain. The history is provided by the patient.  Knee Pain Location:  Knee Time since incident:  1 day Injury: no   Knee location:  R knee Pain details:    Quality:  Aching and throbbing   Radiates to: right hip.   Severity:  Severe   Onset quality:  Sudden   Timing:  Constant   Progression:  Worsening Chronicity:  Chronic Dislocation: no   Foreign body present:  No foreign bodies Prior injury to area:  No Relieved by:  Nothing Worsened by:  Bearing weight Ineffective treatments:  Acetaminophen Associated symptoms: back pain, decreased ROM, stiffness and swelling   Associated symptoms: no neck pain   Risk factors: obesity     Past Medical History  Diagnosis Date  . Thyroid disease   . Rheumatoid arthritis   . Hypercholesteremia     Past Surgical History  Procedure Laterality Date  . Hand surgery    . Tubal ligation    . Tonsillectomy    . Heel spur surgery    . Cholecystectomy    . Elbow surgery    . Exploratory laparotomy      History reviewed. No pertinent family history.  History  Substance Use Topics  . Smoking status: Current Every Day Smoker -- 0.50 packs/day    Types: Cigarettes  . Smokeless tobacco: Not on file  . Alcohol Use: No    OB History   Grav Para Term Preterm Abortions TAB SAB Ect Mult Living                  Review of Systems  Constitutional: Negative for activity change.       All ROS Neg except as noted in HPI  HENT: Negative for nosebleeds and neck pain.   Eyes: Negative for photophobia and discharge.  Respiratory: Negative for cough, shortness of breath and wheezing.   Cardiovascular: Negative for  chest pain and palpitations.  Gastrointestinal: Negative for abdominal pain and blood in stool.  Genitourinary: Negative for dysuria, frequency and hematuria.  Musculoskeletal: Positive for back pain, arthralgias and stiffness.  Skin: Negative.   Neurological: Negative for dizziness, seizures and speech difficulty.  Psychiatric/Behavioral: Negative for hallucinations and confusion.    Allergies  Latex; Oxycodone-acetaminophen; and Penicillins  Home Medications   Current Outpatient Rx  Name  Route  Sig  Dispense  Refill  . acetaminophen (TYLENOL) 325 MG tablet   Oral   Take 650 mg by mouth daily as needed for pain.         . Aspirin-Acetaminophen-Caffeine (EXCEDRIN PO)   Oral   Take 4 tablets by mouth daily.           BP 165/88  Pulse 77  Temp(Src) 98.3 F (36.8 C) (Oral)  Resp 22  Ht 5\' 3"  (1.6 m)  Wt 233 lb (105.688 kg)  BMI 41.28 kg/m2  SpO2 96%  Physical Exam  Nursing note and vitals reviewed. Constitutional: She is oriented to person, place, and time. She appears well-developed and well-nourished.  Non-toxic appearance.  HENT:  Head: Normocephalic.  Right Ear: Tympanic membrane and external ear normal.  Left Ear: Tympanic membrane and external ear normal.  Eyes: EOM and lids are normal. Pupils are equal, round, and reactive to light.  Neck: Normal range of motion. Neck supple. Carotid bruit is not present.  Cardiovascular: Normal rate, regular rhythm, normal heart sounds, intact distal pulses and normal pulses.   Pulmonary/Chest: Breath sounds normal. No respiratory distress.  Abdominal: Soft. Bowel sounds are normal. There is no tenderness. There is no guarding.  Musculoskeletal: Normal range of motion.  There is good range of motion of both shoulders. Good range of motion of both elbows. There is soreness with range of motion of the wrist. There are degenerative joint disease changes of the wrist and fingers of both hands. In particular there is a swan-neck  deformity of the fingers.  There is decreased range of motion of both hips. The patient would not cooperate for examination of the knees do to pain. There is some swelling and questionable effusion present particularly of the right knee. The right knee is warm to touch. There is no pitting edema present but there is some puffiness of the lower extremities. The dorsalis pedis pulses 2+ bilaterally. The Achilles tendon is intact bilaterally.  Lymphadenopathy:       Head (right side): No submandibular adenopathy present.       Head (left side): No submandibular adenopathy present.    She has no cervical adenopathy.  Neurological: She is alert and oriented to person, place, and time. She has normal strength. No cranial nerve deficit or sensory deficit.  Skin: Skin is warm and dry.  Psychiatric: She has a normal mood and affect. Her speech is normal.    ED Course  Procedures (including critical care time)  Labs Reviewed - No data to display No results found.   No diagnosis found.    MDM  I have reviewed nursing notes, vital signs, and all appropriate lab and imaging results for this patient. Pt has right leg pain and swelling. Xray of the right femur reveals extensive DJD present. No occult fx. Plan - appply heat. Use decadron and norco for pain. Pt to see orthopedics MD for follow up and recheck.       Kathie Dike, PA-C 04/19/13 2211

## 2013-04-21 NOTE — ED Provider Notes (Signed)
Medical screening examination/treatment/procedure(s) were performed by non-physician practitioner and as supervising physician I was immediately available for consultation/collaboration.  Tyheim Vanalstyne R. Kindal Ponti, MD 04/21/13 0804 

## 2013-08-17 ENCOUNTER — Other Ambulatory Visit (HOSPITAL_COMMUNITY): Payer: Self-pay | Admitting: Physician Assistant

## 2013-08-17 DIAGNOSIS — Z139 Encounter for screening, unspecified: Secondary | ICD-10-CM

## 2013-09-13 ENCOUNTER — Ambulatory Visit (HOSPITAL_COMMUNITY): Payer: Self-pay

## 2013-10-07 ENCOUNTER — Emergency Department (HOSPITAL_COMMUNITY)
Admission: EM | Admit: 2013-10-07 | Discharge: 2013-10-07 | Disposition: A | Discharge status: Home / Self Care | Payer: Self-pay | Attending: Emergency Medicine | Admitting: Emergency Medicine

## 2013-10-07 ENCOUNTER — Encounter (HOSPITAL_COMMUNITY): Payer: Self-pay | Admitting: Emergency Medicine

## 2013-10-07 DIAGNOSIS — IMO0002 Reserved for concepts with insufficient information to code with codable children: Secondary | ICD-10-CM | POA: Insufficient documentation

## 2013-10-07 DIAGNOSIS — F17200 Nicotine dependence, unspecified, uncomplicated: Secondary | ICD-10-CM | POA: Insufficient documentation

## 2013-10-07 DIAGNOSIS — M069 Rheumatoid arthritis, unspecified: Secondary | ICD-10-CM

## 2013-10-07 DIAGNOSIS — Z880 Allergy status to penicillin: Secondary | ICD-10-CM | POA: Insufficient documentation

## 2013-10-07 DIAGNOSIS — Z79899 Other long term (current) drug therapy: Secondary | ICD-10-CM | POA: Insufficient documentation

## 2013-10-07 DIAGNOSIS — Z8639 Personal history of other endocrine, nutritional and metabolic disease: Secondary | ICD-10-CM | POA: Insufficient documentation

## 2013-10-07 DIAGNOSIS — Z91040 Latex allergy status: Secondary | ICD-10-CM | POA: Insufficient documentation

## 2013-10-07 DIAGNOSIS — Z862 Personal history of diseases of the blood and blood-forming organs and certain disorders involving the immune mechanism: Secondary | ICD-10-CM | POA: Insufficient documentation

## 2013-10-07 DIAGNOSIS — Z9889 Other specified postprocedural states: Secondary | ICD-10-CM | POA: Insufficient documentation

## 2013-10-07 HISTORY — DX: Hypothyroidism, unspecified: E03.9

## 2013-10-07 MED ORDER — PREDNISONE 50 MG PO TABS
60.0000 mg | ORAL_TABLET | Freq: Once | ORAL | Status: AC
  Administered 2013-10-07: 60 mg via ORAL
  Filled 2013-10-07 (×2): qty 1

## 2013-10-07 MED ORDER — HYDROMORPHONE HCL PF 2 MG/ML IJ SOLN
2.0000 mg | Freq: Once | INTRAMUSCULAR | Status: AC
  Administered 2013-10-07: 2 mg via INTRAMUSCULAR
  Filled 2013-10-07: qty 1

## 2013-10-07 MED ORDER — ONDANSETRON 4 MG PO TBDP
4.0000 mg | ORAL_TABLET | Freq: Once | ORAL | Status: AC
Start: 2013-10-07 — End: 2013-10-07
  Administered 2013-10-07: 4 mg via ORAL
  Filled 2013-10-07: qty 1

## 2013-10-07 MED ORDER — HYDROCODONE-ACETAMINOPHEN 5-325 MG PO TABS: 1.0000 | ORAL_TABLET | Freq: Four times a day (QID) | ORAL | Status: DC | PRN

## 2013-10-07 MED ORDER — PREDNISONE 10 MG PO TABS: 40.0000 mg | ORAL_TABLET | Freq: Every day | ORAL | Status: DC

## 2013-10-07 NOTE — ED Notes (Signed)
Pt has not had tylenol or ibuprofen within the last 24 hours. Per pt "they just don't work." Pt resting on stretcher. Continues to rate pain 9/10. Son at bedside. Awaiting provider evaluation.

## 2013-10-07 NOTE — ED Provider Notes (Signed)
CSN: [id number]     Arrival date & time 10/07/13  1717 History   First MD Initiated Contact with Patient 10/07/13 1925     Chief Complaint  Patient presents with  . Joint Pain   (Consider location/radiation/quality/duration/timing/severity/associated sxs/prior Treatment) The history is provided by the patient, the EMS personnel and a relative.   57 year old female brought in by EMS for acute joint pain. Patient's had no fall or injuries. Started 2 days ago right breast right knee worse with movement consistent with her rheumatoid arthritis. Last significant flare was in the spring. That was successfully treated with prednisone and narcotic pain medication. Patient [facility]. Patient has been seen by rheumatology in the past has had workup and was told that the she has rheumatoid arthritis no history of gout. Current pain is 10 out of 10 localized to the right wrist and the right knee. No other systemic symptoms.  Past Medical History  Diagnosis Date  . Rheumatoid arthritis(714.0)   . Hypercholesteremia   . Hypothyroid    Past Surgical History  Procedure Laterality Date  . Hand surgery    . Tubal ligation    . Tonsillectomy    . Heel spur surgery    . Cholecystectomy    . Elbow surgery    . Exploratory laparotomy     No family history on file. History  Substance Use Topics  . Smoking status: Current Every Day Smoker -- 1.00 packs/day for 40 years    Types: Cigarettes  . Smokeless tobacco: Never Used  . Alcohol Use: No   OB History   Grav Para Term Preterm Abortions TAB SAB Ect Mult Living                 Review of Systems  Constitutional: Negative for fever.  HENT: Negative for congestion.   Eyes: Negative for redness.  Respiratory: Negative for shortness of breath.   Cardiovascular: Negative for chest pain.  Gastrointestinal: Negative for abdominal pain.  Genitourinary: Negative for dysuria.  Musculoskeletal: Negative for arthralgias.   Skin: Negative for rash and wound.  Neurological: Negative for headaches.  Hematological: Does not bruise/bleed easily.  Psychiatric/Behavioral: Negative for confusion.    Allergies  Latex; Oxycodone-acetaminophen; and Penicillins  Home Medications   Current Outpatient Rx  Name  Route  Sig  Dispense  Refill  . acetaminophen (TYLENOL) 325 MG tablet   Oral   Take 650 mg by mouth daily as needed for pain.         . Aspirin-Acetaminophen-Caffeine (EXCEDRIN PO)   Oral   Take 4 tablets by mouth daily.         Marland Kitchen dexamethasone (DECADRON) 4 MG tablet      1 po daily with food   7 tablet   0   . HYDROcodone-acetaminophen (NORCO) 7.5-325 MG per tablet   Oral   Take 1 tablet by mouth every 4 (four) hours as needed for pain.   20 tablet   0     Please take this medication with food   . HYDROcodone-acetaminophen (NORCO/VICODIN) 5-325 MG per tablet   Oral   Take 1-2 tablets by mouth every 6 (six) hours as needed for pain.   20 tablet   0   . predniSONE (DELTASONE) 10 MG tablet   Oral   Take 4 tablets (40 mg total) by mouth daily.   20 tablet   0    BP 147/78  Pulse 74  Temp(Src) 98.4 F (  36.9 C) (Oral)  Resp 22  Ht 5\' 4"  (1.626 m)  Wt 215 lb (97.523 kg)  BMI 36.89 kg/m2  SpO2 97% Physical Exam  Nursing note and vitals reviewed. Constitutional: She appears well-developed and well-nourished. No distress.  HENT:  Head: Normocephalic and atraumatic.  Mouth/Throat: Oropharynx is clear and moist.  Eyes: Conjunctivae and EOM are normal. Pupils are equal, round, and reactive to light. No scleral icterus.  Neck: Normal range of motion.  Cardiovascular: Normal rate, regular rhythm, normal heart sounds and intact distal pulses.   Pulmonary/Chest: Effort normal and breath sounds normal. No respiratory distress.  Abdominal: Soft. Bowel sounds are normal. There is no tenderness.  Musculoskeletal: She exhibits edema and tenderness.  Pain with range of motion of the right  wrist and right knee. Right wrist with the some swelling tenderness to palpation and erythema. Radial pulses 2+. Right knee with increased warmth no redness no evidence of effusion patella in the proper location. Cap refill to the toes is 1 second. Some pain with range of motion of the right knee and right wrist.    ED Course  Procedures (including critical care time) Labs Review Labs Reviewed - No data to display Imaging Review No results found.  EKG Interpretation   None       MDM   1. RHEUMATOID ARTHRITIS    Patient had been followed by rheumatology in the past as been told that she has rheumatoid arthritis. Patient with swelling and pain to the right breast with some redness seems that could be consistent with gout however the right knee is warm but no redness no significant swelling. Will go based on the patient's history that this is been rheumatoid arthritis in the past. No injuries not toxic no concern for septic joint. Patient given a IM hydromorphone in the ED along with antinausea medicine. Patient has been able to handle hydrocodone in the past and also treated here with steroids and we discharged home with a course of steroids and hydrocodone. Resource guide provided to help patient find a new doctor.    Shelda Jakes, MD 10/07/13 2010

## 2013-10-07 NOTE — ED Notes (Signed)
Pt has hx of RA. Worsening pain in right arm and leg x 2 days. Worsens with movement.

## 2013-10-07 NOTE — ED Notes (Signed)
Pt resting on stretcher. Continues to rate pain 9/10. Awaiting provider evaluation. Pt readjusted in bed and provided warm blankets for comfort

## 2013-10-29 ENCOUNTER — Encounter (HOSPITAL_COMMUNITY): Payer: Self-pay | Admitting: Emergency Medicine

## 2013-10-29 ENCOUNTER — Emergency Department (HOSPITAL_COMMUNITY)
Admission: EM | Admit: 2013-10-29 | Discharge: 2013-10-29 | Disposition: A | Discharge status: Home / Self Care | Payer: Self-pay | Attending: Emergency Medicine | Admitting: Emergency Medicine

## 2013-10-29 DIAGNOSIS — E039 Hypothyroidism, unspecified: Secondary | ICD-10-CM | POA: Insufficient documentation

## 2013-10-29 DIAGNOSIS — Z91040 Latex allergy status: Secondary | ICD-10-CM | POA: Insufficient documentation

## 2013-10-29 DIAGNOSIS — M069 Rheumatoid arthritis, unspecified: Secondary | ICD-10-CM | POA: Insufficient documentation

## 2013-10-29 DIAGNOSIS — F17200 Nicotine dependence, unspecified, uncomplicated: Secondary | ICD-10-CM | POA: Insufficient documentation

## 2013-10-29 DIAGNOSIS — E7800 Pure hypercholesterolemia, unspecified: Secondary | ICD-10-CM | POA: Insufficient documentation

## 2013-10-29 DIAGNOSIS — Z79899 Other long term (current) drug therapy: Secondary | ICD-10-CM | POA: Insufficient documentation

## 2013-10-29 DIAGNOSIS — Z880 Allergy status to penicillin: Secondary | ICD-10-CM | POA: Insufficient documentation

## 2013-10-29 DIAGNOSIS — M170 Bilateral primary osteoarthritis of knee: Secondary | ICD-10-CM

## 2013-10-29 DIAGNOSIS — E669 Obesity, unspecified: Secondary | ICD-10-CM | POA: Insufficient documentation

## 2013-10-29 MED ORDER — INDOMETHACIN 25 MG PO CAPS: 25.0000 mg | ORAL_CAPSULE | Freq: Three times a day (TID) | ORAL | Status: DC | PRN

## 2013-10-29 MED ORDER — HYDROCODONE-ACETAMINOPHEN 5-325 MG PO TABS
2.0000 | ORAL_TABLET | Freq: Once | ORAL | Status: AC
  Administered 2013-10-29: 2 via ORAL
  Filled 2013-10-29: qty 2

## 2013-10-29 MED ORDER — HYDROCODONE-ACETAMINOPHEN 5-325 MG PO TABS: 1.0000 | ORAL_TABLET | ORAL | Status: DC | PRN

## 2013-10-29 MED ORDER — HYDROMORPHONE HCL PF 1 MG/ML IJ SOLN
INTRAMUSCULAR | Status: AC
  Administered 2013-10-29: 1 mg via INTRAMUSCULAR
  Filled 2013-10-29: qty 1

## 2013-10-29 MED ORDER — HYDROMORPHONE HCL PF 1 MG/ML IJ SOLN
1.0000 mg | Freq: Once | INTRAMUSCULAR | Status: AC
  Administered 2013-10-29: 1 mg via INTRAMUSCULAR

## 2013-10-29 MED ORDER — PROMETHAZINE HCL 12.5 MG PO TABS
25.0000 mg | ORAL_TABLET | Freq: Once | ORAL | Status: AC
  Administered 2013-10-29: 25 mg via ORAL

## 2013-10-29 MED ORDER — PROMETHAZINE HCL 12.5 MG PO TABS
ORAL_TABLET | ORAL | Status: AC
  Administered 2013-10-29: 25 mg via ORAL
  Filled 2013-10-29: qty 2

## 2013-10-29 NOTE — ED Notes (Signed)
Patient w/hx of rheumatoid arthritis experiencing flair of symptoms since yesterday.  Has only been taking Excedrin and using heat which has not helped.  Prior to losing her insurance, she was on Embrel injections and Methotrexate which controlled symptoms.  Seen here 10/07/13 for same problem, states Prednisone and Vicodin course helped. Has been receiving care through [facility] who is trying to get her in w/rheumatologist.

## 2013-10-29 NOTE — ED Notes (Signed)
Pt reports has arthritis in both knees and pain flared up yesterday.

## 2013-10-29 NOTE — ED Notes (Signed)
Patient with no complaints at this time. Respirations even and unlabored. Skin warm/dry. Discharge instructions reviewed with patient at this time. Patient given opportunity to voice concerns/ask questions. Patient discharged at this time and left Emergency Department with steady gait.   

## 2013-10-29 NOTE — ED Provider Notes (Signed)
CSN: [id number]     Arrival date & time 10/29/13  1019 History   First MD Initiated Contact with Patient 10/29/13 1026     Chief Complaint  Patient presents with  . Knee Pain   (Consider location/radiation/quality/duration/timing/severity/associated sxs/prior Treatment) Patient is a 57 y.o. female presenting with knee pain. The history is provided by the patient.  Knee Pain Location:  Knee Time since incident:  1 day Injury: no   Knee location:  L knee and R knee Pain details:    Quality:  Throbbing and burning   Radiates to:  Does not radiate   Severity:  Severe   Onset quality:  Gradual   Duration:  1 day   Timing:  Constant   Progression:  Worsening Chronicity:  Chronic Dislocation: no   Foreign body present:  No foreign bodies Prior injury to area:  No Relieved by:  Nothing  [name] is a 57 y.o. female who presents to the ED with bilateral knee pain. History of Rheumatoid arthritis. Has been worked up by a rheumatologist and given her diagnosis. She has not followed up in a long time because she has no insurance. States she comes here and gets dilaudid and hydrocodone.   Past Medical History  Diagnosis Date  . Rheumatoid arthritis(714.0)   . Hypercholesteremia   . Hypothyroid    Past Surgical History  Procedure Laterality Date  . Hand surgery    . Tubal ligation    . Tonsillectomy    . Heel spur surgery    . Cholecystectomy    . Elbow surgery    . Exploratory laparotomy     No family history on file. History  Substance Use Topics  . Smoking status: Current Every Day Smoker -- 1.00 packs/day for 40 years    Types: Cigarettes  . Smokeless tobacco: Never Used  . Alcohol Use: No   OB History   Grav Para Term Preterm Abortions TAB SAB Ect Mult Living                 Review of Systems Negative except as started in HPI  Allergies  Latex; Oxycodone-acetaminophen; and Penicillins  Home Medications   Current Outpatient Rx  Name  Route  Sig   Dispense  Refill  . Aspirin-Acetaminophen-Caffeine (EXCEDRIN PO)   Oral   Take 3-4 tablets by mouth daily.          . hydrochlorothiazide (MICROZIDE) 12.5 MG capsule   Oral   Take 12.5 mg by mouth daily.         Marland Kitchen levothyroxine (SYNTHROID, LEVOTHROID) 150 MCG tablet   Oral   Take 150 mcg by mouth daily before breakfast.         . Liniments (SALONPAS EX)   Apply externally   Apply 1 patch topically daily as needed (knee pain).         . pantoprazole (PROTONIX) 40 MG tablet   Oral   Take 40 mg by mouth daily.         . simvastatin (ZOCOR) 20 MG tablet   Oral   Take 20 mg by mouth daily.          BP 113/69  Pulse 85  Temp(Src) 97.5 F (36.4 C) (Oral)  Resp 18  Ht 5\' 3"  (1.6 m)  Wt 215 lb (97.523 kg)  BMI 38.09 kg/m2  SpO2 99% Physical Exam  Nursing note and vitals reviewed. Constitutional: She is oriented to person, place, and time. No distress.  Obese female. Appears uncomfortable   HENT:  Head: Normocephalic and atraumatic.  Eyes: Conjunctivae and EOM are normal.  Neck: Neck supple.  Cardiovascular: Normal rate and regular rhythm.   Pulmonary/Chest: Effort normal.  Abdominal: Soft. There is no tenderness.  Musculoskeletal:       Legs: Bilateral knees with swelling and tenderness on exam. Pain with any range of motion. Pedal pulses strong and equal bilateral, adequate circulation, good touch sensation.   Neurological: She is alert and oriented to person, place, and time. No cranial nerve deficit.  Skin: Skin is warm and dry.  Psychiatric: She has a normal mood and affect. Her behavior is normal.    ED Course Patient given hydrocodone and continued to have severe pain. Dilaudid 1 mg IM given.   Procedures   MDM: patient pain has improved with Dilaudid. Stable for discharge home without any immediate complications.   57 y.o. female with bilateral knee pain and swelling. History of Rheumatoid arthritis. Will treat for pain and refer for follow up for  pain management.  Discussed with the patient and all questioned fully answered. She will return if any problems arise.     Medication List    TAKE these medications       HYDROcodone-acetaminophen 5-325 MG per tablet  Commonly known as:  NORCO/VICODIN  Take 1 tablet by mouth every 4 (four) hours as needed.     indomethacin 25 MG capsule  Commonly known as:  INDOCIN  Take 1 capsule (25 mg total) by mouth 3 (three) times daily as needed.      ASK your doctor about these medications       EXCEDRIN PO  Take 3-4 tablets by mouth daily.     hydrochlorothiazide 12.5 MG capsule  Commonly known as:  MICROZIDE  Take 12.5 mg by mouth daily.     levothyroxine 150 MCG tablet  Commonly known as:  SYNTHROID, LEVOTHROID  Take 150 mcg by mouth daily before breakfast.     pantoprazole 40 MG tablet  Commonly known as:  PROTONIX  Take 40 mg by mouth daily.     SALONPAS EX  Apply 1 patch topically daily as needed (knee pain).     simvastatin 20 MG tablet  Commonly known as:  ZOCOR  Take 20 mg by mouth daily.           [facility] Orlene Och, [place] 10/30/13 [phone]

## 2013-11-02 NOTE — ED Provider Notes (Signed)
Medical screening examination/treatment/procedure(s) were performed by non-physician practitioner and as supervising physician I was immediately available for consultation/collaboration.  EKG Interpretation   None         Emsley Custer J Merrillyn Ackerley, MD 11/02/13 1953 

## 2013-11-18 ENCOUNTER — Encounter (INDEPENDENT_AMBULATORY_CARE_PROVIDER_SITE_OTHER): Payer: Self-pay

## 2013-11-18 ENCOUNTER — Ambulatory Visit (INDEPENDENT_AMBULATORY_CARE_PROVIDER_SITE_OTHER): Payer: Self-pay | Admitting: Otolaryngology

## 2013-11-18 DIAGNOSIS — R490 Dysphonia: Secondary | ICD-10-CM

## 2013-11-18 DIAGNOSIS — K219 Gastro-esophageal reflux disease without esophagitis: Secondary | ICD-10-CM

## 2013-12-16 HISTORY — PX: OTHER SURGICAL HISTORY: SHX169

## 2013-12-23 ENCOUNTER — Ambulatory Visit (INDEPENDENT_AMBULATORY_CARE_PROVIDER_SITE_OTHER): Payer: Self-pay | Admitting: Otolaryngology

## 2013-12-23 DIAGNOSIS — J383 Other diseases of vocal cords: Secondary | ICD-10-CM

## 2013-12-23 DIAGNOSIS — K219 Gastro-esophageal reflux disease without esophagitis: Secondary | ICD-10-CM

## 2013-12-23 DIAGNOSIS — R490 Dysphonia: Secondary | ICD-10-CM

## 2015-02-24 ENCOUNTER — Encounter (HOSPITAL_COMMUNITY): Payer: Self-pay | Admitting: *Deleted

## 2015-02-24 ENCOUNTER — Emergency Department (HOSPITAL_COMMUNITY)
Admission: EM | Admit: 2015-02-24 | Discharge: 2015-02-24 | Disposition: A | Discharge status: Home / Self Care | Payer: 59 | Attending: Emergency Medicine | Admitting: Emergency Medicine

## 2015-02-24 DIAGNOSIS — Z7952 Long term (current) use of systemic steroids: Secondary | ICD-10-CM | POA: Insufficient documentation

## 2015-02-24 DIAGNOSIS — E039 Hypothyroidism, unspecified: Secondary | ICD-10-CM | POA: Diagnosis not present

## 2015-02-24 DIAGNOSIS — Z87891 Personal history of nicotine dependence: Secondary | ICD-10-CM | POA: Diagnosis not present

## 2015-02-24 DIAGNOSIS — E780 Pure hypercholesterolemia: Secondary | ICD-10-CM | POA: Diagnosis not present

## 2015-02-24 DIAGNOSIS — M069 Rheumatoid arthritis, unspecified: Secondary | ICD-10-CM | POA: Diagnosis not present

## 2015-02-24 DIAGNOSIS — M25562 Pain in left knee: Secondary | ICD-10-CM | POA: Insufficient documentation

## 2015-02-24 DIAGNOSIS — Z91040 Latex allergy status: Secondary | ICD-10-CM | POA: Insufficient documentation

## 2015-02-24 DIAGNOSIS — Z79899 Other long term (current) drug therapy: Secondary | ICD-10-CM | POA: Insufficient documentation

## 2015-02-24 MED ORDER — HYDROCODONE-ACETAMINOPHEN 5-325 MG PO TABS
1.0000 | ORAL_TABLET | Freq: Once | ORAL | Status: AC
  Administered 2015-02-24: 1 via ORAL
  Filled 2015-02-24: qty 1

## 2015-02-24 MED ORDER — HYDROCODONE-ACETAMINOPHEN 5-325 MG PO TABS: 1.0000 | ORAL_TABLET | ORAL | Status: DC | PRN

## 2015-02-24 MED ORDER — PREDNISONE 10 MG PO TABS: 20.0000 mg | ORAL_TABLET | Freq: Every day | ORAL | Status: DC

## 2015-02-24 NOTE — ED Notes (Signed)
Lt knee pain, pt says ,no injury, but has arthritis, in both knees.

## 2015-02-24 NOTE — Discharge Instructions (Signed)
Discuss referral to rheumatology or advanced treatment for rheumatoid arthritis with your primary doctor.  For severe pain take norco or vicodin however realize they have the potential for addiction and it can make you sleepy and has tylenol in it.  No operating machinery while taking. If you were given medicines take as directed.  If you are on coumadin or contraceptives realize their levels and effectiveness is altered by many different medicines.  If you have any reaction (rash, tongues swelling, other) to the medicines stop taking and see a physician.   Please follow up as directed and return to the ER or see a physician for new or worsening symptoms.  Thank you. Filed Vitals:   02/24/15 1347  BP: 130/62  Temp: 98.2 F (36.8 C)  TempSrc: Oral  Resp: 18  Height: 5\' 2"  (1.575 m)  Weight: 244 lb (110.678 kg)  SpO2: 96%

## 2015-02-24 NOTE — ED Notes (Signed)
Pt states left knee pain began last night. States hx of RA. No known injury.

## 2015-02-24 NOTE — ED Provider Notes (Signed)
CSN: [id number]     Arrival date & time 02/24/15  1339 History   This chart was scribed for Marilyn Ohara, MD by Abel Presto, ED Scribe. This patient was seen in room APFT23/APFT23 and the patient's care was started at 2:12 PM.    Chief Complaint  Patient presents with  . Knee Pain     Patient is a 59 y.o. female presenting with knee pain. The history is provided by the patient. No language interpreter was used.  Knee Pain Associated symptoms: no fever    HPI Comments: [name] is a 59 y.o. female with PMHx of HLD, and hypothyroid, who presents to the Emergency Department complaining of burning left knee pain with onset yesterday. Pt with h/o rheumatoid arthritis, notes mostly in right leg, but states left leg pain is similar to arthritic pain of right.  Pt notes associated swelling. Pt takes Aleve with no relief. Pt denies h/o septic joint. Pt has not been taking her arthritis medication for approximately a year. Pt is allergic to oxycodone and penicillins.  Pt denies recent injury, fever, chills, and vomiting.  Past Medical History  Diagnosis Date  . Rheumatoid arthritis(714.0)   . Hypercholesteremia   . Hypothyroid    Past Surgical History  Procedure Laterality Date  . Hand surgery    . Tubal ligation    . Tonsillectomy    . Heel spur surgery    . Cholecystectomy    . Elbow surgery    . Exploratory laparotomy     No family history on file. History  Substance Use Topics  . Smoking status: Former Smoker -- 1.00 packs/day for 40 years    Types: Cigarettes    Quit date: 02/23/2014  . Smokeless tobacco: Never Used  . Alcohol Use: No   OB History    No data available     Review of Systems  Constitutional: Negative for fever and chills.  Gastrointestinal: Negative for vomiting.  Musculoskeletal: Positive for joint swelling and arthralgias.  All other systems reviewed and are negative.     Allergies  Latex; Oxycodone-acetaminophen; and Penicillins  Home  Medications   Prior to Admission medications   Medication Sig Start Date End Date Taking? Authorizing Provider  Aspirin-Acetaminophen-Caffeine (EXCEDRIN PO) Take 3-4 tablets by mouth daily.     Historical Provider, MD  hydrochlorothiazide (MICROZIDE) 12.5 MG capsule Take 12.5 mg by mouth daily.    Historical Provider, MD  HYDROcodone-acetaminophen (NORCO) 5-325 MG per tablet Take 1 tablet by mouth every 4 (four) hours as needed for severe pain. 02/24/15   Marilyn Ohara, MD  indomethacin (INDOCIN) 25 MG capsule Take 1 capsule (25 mg total) by mouth 3 (three) times daily as needed. 10/29/13   Hope Orlene Och, NP  levothyroxine (SYNTHROID, LEVOTHROID) 150 MCG tablet Take 150 mcg by mouth daily before breakfast.    Historical Provider, MD  Liniments (SALONPAS EX) Apply 1 patch topically daily as needed (knee pain).    Historical Provider, MD  pantoprazole (PROTONIX) 40 MG tablet Take 40 mg by mouth daily.    Historical Provider, MD  predniSONE (DELTASONE) 10 MG tablet Take 2 tablets (20 mg total) by mouth daily. 02/24/15   Marilyn Ohara, MD  simvastatin (ZOCOR) 20 MG tablet Take 20 mg by mouth daily.    Historical Provider, MD   BP 130/62 mmHg  Temp(Src) 98.2 F (36.8 C) (Oral)  Resp 18  Ht 5\' 2"  (1.575 m)  Wt 244 lb (110.678 kg)  BMI 44.62 kg/m2  SpO2 96% Physical Exam  Constitutional: She is oriented to person, place, and time. She appears well-developed and well-nourished.  HENT:  Head: Normocephalic.  Eyes: Conjunctivae are normal.  Neck: Normal range of motion. Neck supple.  Pulmonary/Chest: Effort normal.  Musculoskeletal: Normal range of motion.       Left knee: She exhibits effusion.  Left knee: no obvious ligament instability, however difficult exam due to pain. No significant warmth.   Neurological: She is alert and oriented to person, place, and time.  Skin: Skin is warm and dry.  Psychiatric: She has a normal mood and affect. Her behavior is normal.  Nursing note and vitals  reviewed.   ED Course  Procedures (including critical care time) DIAGNOSTIC STUDIES: Oxygen Saturation is 96% on room air, normal by my interpretation.    COORDINATION OF CARE: 2:16 PM Discussed treatment plan with patient at beside, the patient agrees with the plan and has no further questions at this time.   Labs Review Labs Reviewed - No data to display  Imaging Review No results found.   EKG Interpretation None      MDM   Final diagnoses:  Acute knee pain, left  Rheumatoid arthritis   I personally performed the services described in this documentation, which was scribed in my presence. The recorded information has been reviewed and is accurate.  Patient with history of rheumatoid arthritis recently new primary Dr. and insurance. Patient looking to getting back on more advanced medicines such as mother tracks a. Patient has no signs of septic knee, no injury. Discussed short course of steroids and pain meds with close follow-up with primary doctor.  Results and differential diagnosis were discussed with the patient/parent/guardian. Close follow up outpatient was discussed, comfortable with the plan.   Medications  HYDROcodone-acetaminophen (NORCO/VICODIN) 5-325 MG per tablet 1 tablet (1 tablet Oral Given 02/24/15 1424)    Filed Vitals:   02/24/15 1347  BP: 130/62  Temp: 98.2 F (36.8 C)  TempSrc: Oral  Resp: 18  Height: 5\' 2"  (1.575 m)  Weight: 244 lb (110.678 kg)  SpO2: 96%    Final diagnoses:  Acute knee pain, left  Rheumatoid arthritis      , MD 02/26/15 2235

## 2015-04-23 ENCOUNTER — Inpatient Hospital Stay (HOSPITAL_COMMUNITY)
Admission: EM | Admit: 2015-04-23 | Discharge: 2015-04-25 | DRG: 683 | Disposition: A | Discharge status: Home / Self Care | Payer: 59 | Attending: Internal Medicine | Admitting: Internal Medicine

## 2015-04-23 ENCOUNTER — Encounter (HOSPITAL_COMMUNITY): Payer: Self-pay | Admitting: *Deleted

## 2015-04-23 DIAGNOSIS — E876 Hypokalemia: Secondary | ICD-10-CM | POA: Diagnosis present

## 2015-04-23 DIAGNOSIS — R112 Nausea with vomiting, unspecified: Secondary | ICD-10-CM

## 2015-04-23 DIAGNOSIS — E785 Hyperlipidemia, unspecified: Secondary | ICD-10-CM | POA: Diagnosis present

## 2015-04-23 DIAGNOSIS — Z87891 Personal history of nicotine dependence: Secondary | ICD-10-CM

## 2015-04-23 DIAGNOSIS — N179 Acute kidney failure, unspecified: Principal | ICD-10-CM

## 2015-04-23 DIAGNOSIS — A084 Viral intestinal infection, unspecified: Secondary | ICD-10-CM | POA: Diagnosis present

## 2015-04-23 DIAGNOSIS — E039 Hypothyroidism, unspecified: Secondary | ICD-10-CM | POA: Diagnosis not present

## 2015-04-23 DIAGNOSIS — R197 Diarrhea, unspecified: Secondary | ICD-10-CM | POA: Diagnosis present

## 2015-04-23 DIAGNOSIS — I10 Essential (primary) hypertension: Secondary | ICD-10-CM | POA: Diagnosis present

## 2015-04-23 DIAGNOSIS — Z801 Family history of malignant neoplasm of trachea, bronchus and lung: Secondary | ICD-10-CM

## 2015-04-23 DIAGNOSIS — E780 Pure hypercholesterolemia: Secondary | ICD-10-CM | POA: Diagnosis present

## 2015-04-23 DIAGNOSIS — R5383 Other fatigue: Secondary | ICD-10-CM

## 2015-04-23 DIAGNOSIS — M069 Rheumatoid arthritis, unspecified: Secondary | ICD-10-CM | POA: Diagnosis present

## 2015-04-23 DIAGNOSIS — E872 Acidosis, unspecified: Secondary | ICD-10-CM

## 2015-04-23 DIAGNOSIS — E860 Dehydration: Secondary | ICD-10-CM | POA: Diagnosis present

## 2015-04-23 LAB — COMPREHENSIVE METABOLIC PANEL
ALK PHOS: 84 U/L (ref 38–126)
ALT: 13 U/L — ABNORMAL LOW (ref 14–54)
ANION GAP: 10 (ref 5–15)
AST: 10 U/L — ABNORMAL LOW (ref 15–41)
Albumin: 2.6 g/dL — ABNORMAL LOW (ref 3.5–5.0)
BUN: 65 mg/dL — AB (ref 6–20)
CO2: 25 mmol/L (ref 22–32)
CREATININE: 1.24 mg/dL — AB (ref 0.44–1.00)
Calcium: 8.1 mg/dL — ABNORMAL LOW (ref 8.9–10.3)
Chloride: 98 mmol/L — ABNORMAL LOW (ref 101–111)
GFR calc non Af Amer: 47 mL/min — ABNORMAL LOW (ref >60)
GFR, EST AFRICAN AMERICAN: 55 mL/min — AB (ref >60)
GLUCOSE: 103 mg/dL — AB (ref 70–99)
Potassium: 2.8 mmol/L — ABNORMAL LOW (ref 3.5–5.1)
Sodium: 133 mmol/L — ABNORMAL LOW (ref 135–145)
TOTAL PROTEIN: 6.2 g/dL — AB (ref 6.5–8.1)
Total Bilirubin: 0.5 mg/dL (ref 0.3–1.2)

## 2015-04-23 LAB — CBC
HCT: 46.6 % — ABNORMAL HIGH (ref 36.0–46.0)
HEMOGLOBIN: 15.8 g/dL — AB (ref 12.0–15.0)
MCH: 30.4 pg (ref 26.0–34.0)
MCHC: 33.9 g/dL (ref 30.0–36.0)
MCV: 89.8 fL (ref 78.0–100.0)
PLATELETS: 189 10*3/uL (ref 150–400)
RBC: 5.19 MIL/uL — AB (ref 3.87–5.11)
RDW: 13.8 % (ref 11.5–15.5)
WBC: 7.2 10*3/uL (ref 4.0–10.5)

## 2015-04-23 LAB — LIPASE, BLOOD: LIPASE: 16 U/L — AB (ref 22–51)

## 2015-04-23 LAB — URINE MICROSCOPIC-ADD ON

## 2015-04-23 LAB — URINALYSIS, ROUTINE W REFLEX MICROSCOPIC
Bilirubin Urine: NEGATIVE
Glucose, UA: NEGATIVE mg/dL
Ketones, ur: NEGATIVE mg/dL
Nitrite: NEGATIVE
PH: 7 (ref 5.0–8.0)
Protein, ur: 30 mg/dL — AB
Specific Gravity, Urine: 1.01 (ref 1.005–1.030)
Urobilinogen, UA: 0.2 mg/dL (ref 0.0–1.0)

## 2015-04-23 LAB — MAGNESIUM: Magnesium: 1.9 mg/dL (ref 1.7–2.4)

## 2015-04-23 MED ORDER — PANTOPRAZOLE SODIUM 40 MG PO TBEC
40.0000 mg | DELAYED_RELEASE_TABLET | Freq: Every day | ORAL | Status: DC
  Administered 2015-04-24 – 2015-04-25 (×2): 40 mg via ORAL
  Filled 2015-04-23 (×2): qty 1

## 2015-04-23 MED ORDER — POTASSIUM CHLORIDE 10 MEQ/100ML IV SOLN
10.0000 meq | Freq: Once | INTRAVENOUS | Status: AC
  Administered 2015-04-23: 10 meq via INTRAVENOUS
  Filled 2015-04-23: qty 100

## 2015-04-23 MED ORDER — POTASSIUM CHLORIDE 10 MEQ/100ML IV SOLN
10.0000 meq | Freq: Once | INTRAVENOUS | Status: AC
  Administered 2015-04-23: 10 meq via INTRAVENOUS

## 2015-04-23 MED ORDER — SODIUM CHLORIDE 0.9 % IJ SOLN: 3.0000 mL | Freq: Two times a day (BID) | INTRAMUSCULAR | Status: DC

## 2015-04-23 MED ORDER — ONDANSETRON HCL 4 MG/2ML IJ SOLN
4.0000 mg | Freq: Four times a day (QID) | INTRAMUSCULAR | Status: DC | PRN
Start: 2015-04-23 — End: 2015-04-25

## 2015-04-23 MED ORDER — ONDANSETRON HCL 4 MG/2ML IJ SOLN
4.0000 mg | Freq: Once | INTRAMUSCULAR | Status: AC
  Administered 2015-04-23: 4 mg via INTRAVENOUS
  Filled 2015-04-23: qty 2

## 2015-04-23 MED ORDER — SODIUM CHLORIDE 0.9 % IV SOLN
1000.0000 mL | Freq: Once | INTRAVENOUS | Status: AC
  Administered 2015-04-23: 1000 mL via INTRAVENOUS

## 2015-04-23 MED ORDER — ENOXAPARIN SODIUM 40 MG/0.4ML ~~LOC~~ SOLN
40.0000 mg | SUBCUTANEOUS | Status: DC
  Administered 2015-04-23 – 2015-04-24 (×2): 40 mg via SUBCUTANEOUS
  Filled 2015-04-23 (×2): qty 0.4

## 2015-04-23 MED ORDER — SODIUM CHLORIDE 0.9 % IV SOLN
INTRAVENOUS | Status: DC
  Administered 2015-04-24 (×2): via INTRAVENOUS

## 2015-04-23 MED ORDER — ONDANSETRON HCL 4 MG PO TABS: 4.0000 mg | ORAL_TABLET | Freq: Four times a day (QID) | ORAL | Status: DC | PRN

## 2015-04-23 MED ORDER — FENTANYL CITRATE (PF) 100 MCG/2ML IJ SOLN
50.0000 ug | INTRAMUSCULAR | Status: DC | PRN
  Administered 2015-04-23: 50 ug via INTRAVENOUS
  Filled 2015-04-23: qty 2

## 2015-04-23 MED ORDER — FENTANYL CITRATE (PF) 100 MCG/2ML IJ SOLN
25.0000 ug | INTRAMUSCULAR | Status: DC | PRN
  Administered 2015-04-23: 25 ug via INTRAVENOUS
  Filled 2015-04-23: qty 2

## 2015-04-23 MED ORDER — CALCIUM GLUCONATE 10 % IV SOLN
1.0000 g | Freq: Once | INTRAVENOUS | Status: AC
  Administered 2015-04-24: 1 g via INTRAVENOUS
  Filled 2015-04-23: qty 10

## 2015-04-23 MED ORDER — POTASSIUM CHLORIDE 10 MEQ/100ML IV SOLN
10.0000 meq | INTRAVENOUS | Status: AC
  Administered 2015-04-23 – 2015-04-24 (×3): 10 meq via INTRAVENOUS
  Filled 2015-04-23 (×3): qty 100

## 2015-04-23 MED ORDER — SODIUM CHLORIDE 0.9 % IV BOLUS (SEPSIS)
1000.0000 mL | Freq: Once | INTRAVENOUS | Status: AC
  Administered 2015-04-23: 1000 mL via INTRAVENOUS

## 2015-04-23 MED ORDER — SIMVASTATIN 20 MG PO TABS
20.0000 mg | ORAL_TABLET | Freq: Every day | ORAL | Status: DC
  Administered 2015-04-23 – 2015-04-24 (×2): 20 mg via ORAL
  Filled 2015-04-23 (×2): qty 1

## 2015-04-23 MED ORDER — SODIUM CHLORIDE 0.9 % IV SOLN
INTRAVENOUS | Status: DC
  Administered 2015-04-23: 21:00:00 via INTRAVENOUS

## 2015-04-23 MED ORDER — LEVOTHYROXINE SODIUM 75 MCG PO TABS
150.0000 ug | ORAL_TABLET | Freq: Every day | ORAL | Status: DC
  Administered 2015-04-24 – 2015-04-25 (×2): 150 ug via ORAL
  Filled 2015-04-23 (×2): qty 2

## 2015-04-23 MED ORDER — POTASSIUM CHLORIDE 10 MEQ/100ML IV SOLN
INTRAVENOUS | Status: AC
  Filled 2015-04-23: qty 100

## 2015-04-23 NOTE — H&P (Signed)
PCP:   No PCP Per Patient   Chief Complaint:  Vomiting and diarrhea  HPI: 59 year old female who   has a past medical history of Rheumatoid arthritis(714.0); Hypercholesteremia; and Hypothyroid. Today came to the hospital with nausea vomiting and diarrhea for past 5 days. Patient says that she had greater than 5 bowel movements everyday, with recurrent nausea and vomiting. She denies any blood in the stool or vomitus. Patient feels very fatigued. She denies chest pain but admits to having abdominal pain also complains of dysuria. She admits to feeling hot with chills but did not record temperature at home. In the ED patient found to have dehydration with acute kidney injury,  Allergies:   Allergies  Allergen Reactions  . Latex Hives and Itching    blisters  . Oxycodone-Acetaminophen Nausea Only  . Adhesive [Tape] Rash    Patient prefers paper tape.   Marland Kitchen Penicillins Swelling and Rash  . Strawberry Itching and Rash      Past Medical History  Diagnosis Date  . Rheumatoid arthritis(714.0)   . Hypercholesteremia   . Hypothyroid     Past Surgical History  Procedure Laterality Date  . Hand surgery    . Tubal ligation    . Tonsillectomy    . Heel spur surgery    . Cholecystectomy    . Elbow surgery    . Exploratory laparotomy    . Partial thyroidectomy  2015    Prior to Admission medications   Medication Sig Start Date End Date Taking? Authorizing Provider  hydrochlorothiazide (MICROZIDE) 12.5 MG capsule Take 12.5 mg by mouth daily.    Historical Provider, MD  HYDROcodone-acetaminophen (NORCO) 5-325 MG per tablet Take 1 tablet by mouth every 4 (four) hours as needed for severe pain. Patient not taking: Reported on 04/23/2015 02/24/15   [name], MD  indomethacin (INDOCIN) 25 MG capsule Take 1 capsule (25 mg total) by mouth 3 (three) times daily as needed. Patient not taking: Reported on 04/23/2015 10/29/13   Janne Napoleon, NP  levothyroxine (SYNTHROID, LEVOTHROID) 150 MCG  tablet Take 150 mcg by mouth daily before breakfast.    Historical Provider, MD  pantoprazole (PROTONIX) 40 MG tablet Take 40 mg by mouth daily.    Historical Provider, MD  predniSONE (DELTASONE) 10 MG tablet Take 2 tablets (20 mg total) by mouth daily. Patient not taking: Reported on 04/23/2015 02/24/15   [name], MD  simvastatin (ZOCOR) 20 MG tablet Take 20 mg by mouth at bedtime.     Historical Provider, MD    Social History:  reports that she quit smoking about 13 months ago. Her smoking use included Cigarettes. She has a 40 pack-year smoking history. She has never used smokeless tobacco. She reports that she does not drink alcohol or use illicit drugs.  Patient's father died of lung cancer  All the positives are listed in BOLD  Review of Systems:  HEENT: Headache, blurred vision, runny nose, sore throat Neck: Hypothyroidism, hyperthyroidism,,lymphadenopathy Chest : Shortness of breath, history of COPD, Asthma Heart : Chest pain, history of coronary arterey disease GI:  Nausea, vomiting, diarrhea, constipation, GERD GU: Dysuria, urgency, frequency of urination, hematuria Neuro: Stroke, seizures, syncope Psych: Depression, anxiety, hallucinations   Physical Exam: Blood pressure 110/65, pulse 87, temperature 98.1 F (36.7 C), temperature source Oral, resp. rate 20, height 5\' 2"  (1.575 m), weight 99.791 kg (220 lb), SpO2 95 %. Constitutional:   Patient is a well-developed and well-nourished female* in no acute distress and cooperative  with exam. Head: Normocephalic and atraumatic Mouth: Mucus membranes moist Eyes: PERRL, EOMI, conjunctivae normal Neck: Supple, No Thyromegaly Cardiovascular: RRR, S1 normal, S2 normal Pulmonary/Chest: CTAB, no wheezes, rales, or rhonchi Abdominal: Soft. Mild generalized tenderness to palpation, non-distended, bowel sounds are normal, no masses, organomegaly, or guarding present.  Neurological: A&O x3, Strength is normal and symmetric  bilaterally, cranial nerve II-XII are grossly intact, no focal motor deficit, sensory intact to light touch bilaterally.  Extremities : No Cyanosis, Clubbing or Edema  Labs on Admission:  Basic Metabolic Panel:  Recent Labs Lab 04/23/15 1835  NA 133*  K 2.8*  CL 98*  CO2 25  GLUCOSE 103*  BUN 65*  CREATININE 1.24*  CALCIUM 8.1*  MG 1.9   Liver Function Tests:  Recent Labs Lab 04/23/15 1835  AST 10*  ALT 13*  ALKPHOS 84  BILITOT 0.5  PROT 6.2*  ALBUMIN 2.6*    Recent Labs Lab 04/23/15 1835  LIPASE 16*   No results for input(s): AMMONIA in the last 168 hours. CBC:  Recent Labs Lab 04/23/15 1835  WBC 7.2  HGB 15.8*  HCT 46.6*  MCV 89.8  PLT 189     Assessment/Plan Active Problems:   Hypothyroidism   Hypokalemia   Diarrhea   Acute kidney injury   Gastroenteritis  Gastroenteritis Patient presented with nausea vomiting and diarrhea for past 5 days, likely viral. White count is normal. She is afebrile in the ED. Will admit the patient for IV fluids, will also check stool for C. difficile PCR, enteric precautions. Follow BMP in a.m. Will give fentanyl when necessary for abdominal pain, Zofran when necessary for nausea vomiting.  Acute kidney injury Due to poor by mouth intake as well as fluid losses from vomiting and diarrhea patient has developed AK I, BUN/creatinine is 65/1.24, her baseline BUN/creatinine is 14/0.92. Will hold HCTZ at this time, start IV fluids check BMP in a.m.  Hypokalemia Patient presented hypokalemia due to nausea vomiting diarrhea as well as HCTZ. Hold HCTZ replace potassium and check BMP in a.m.  Hypothyroidism Continue Synthroid  History of rheumatoid arthritis Will hold indomethacin at this time due to the kidney injury  DVT prophylaxis Lovenox  Code status: Full code  Family discussion: Admission, patients condition and plan of care including tests being ordered have been discussed with the patient and *her husband at  bedside who indicate understanding and agree with the plan and Code Status.   Time Spent on Admission: *55 min  LAMA,GAGAN S [facility] Pager: [phone] 04/23/2015, 9:26 PM  If 7PM-7AM, please contact night-coverage  [email or web address]  Password [email or web address]

## 2015-04-23 NOTE — ED Notes (Signed)
N/V/D x 3.5 days.  Symptoms have only worsened over time and she has become more weak.  VSS per EMS.  Zofran given in route to ER.  N/V has begun to resolve, but diarrhea continues at >5 x day.  Reports mild burning/stinging w/urination.

## 2015-04-23 NOTE — ED Provider Notes (Signed)
CSN: [id number]     Arrival date & time 04/23/15  1759 History   First MD Initiated Contact with Patient 04/23/15 1809     Chief Complaint  Patient presents with  . Emesis     (Consider location/radiation/quality/duration/timing/severity/associated sxs/prior Treatment) HPI Comments: 59 year old female with history of hypothyroidism, reflux, rheumatoid arthritis presents to emergency department for recurrent nausea vomiting and diarrhea greater than 5 times per day nonbloody for the past 4 days. Patient feels generally weaker and fatigued secondarily. No recent antibiotics, no recent travel. Patient feels this started after eating questionable chicken from the store. Patient given Zofran on route. Nothing is improved her symptoms. Diffuse abdominal cramping worse lower. No history of colitis. No fevers.  Patient is a 59 y.o. female presenting with vomiting. The history is provided by the patient.  Emesis Associated symptoms: abdominal pain and diarrhea   Associated symptoms: no chills and no headaches     Past Medical History  Diagnosis Date  . Rheumatoid arthritis(714.0)   . Hypercholesteremia   . Hypothyroid    Past Surgical History  Procedure Laterality Date  . Hand surgery    . Tubal ligation    . Tonsillectomy    . Heel spur surgery    . Cholecystectomy    . Elbow surgery    . Exploratory laparotomy    . Partial thyroidectomy  2015   History reviewed. No pertinent family history. History  Substance Use Topics  . Smoking status: Former Smoker -- 1.00 packs/day for 40 years    Types: Cigarettes    Quit date: 02/23/2014  . Smokeless tobacco: Never Used  . Alcohol Use: No   OB History    No data available     Review of Systems  Constitutional: Positive for fatigue. Negative for fever and chills.  HENT: Negative for congestion.   Eyes: Negative for visual disturbance.  Respiratory: Negative for shortness of breath.   Cardiovascular: Negative for chest pain.   Gastrointestinal: Positive for nausea, vomiting, abdominal pain and diarrhea.  Genitourinary: Positive for dysuria. Negative for flank pain.  Musculoskeletal: Negative for back pain, neck pain and neck stiffness.  Skin: Negative for rash.  Neurological: Positive for light-headedness. Negative for headaches.      Allergies  Latex; Oxycodone-acetaminophen; Adhesive; Penicillins; and Strawberry  Home Medications   Prior to Admission medications   Medication Sig Start Date End Date Taking? Authorizing Provider  hydrochlorothiazide (MICROZIDE) 12.5 MG capsule Take 12.5 mg by mouth daily.    Historical Provider, MD  HYDROcodone-acetaminophen (NORCO) 5-325 MG per tablet Take 1 tablet by mouth every 4 (four) hours as needed for severe pain. Patient not taking: Reported on 04/23/2015 02/24/15   [name], MD  indomethacin (INDOCIN) 25 MG capsule Take 1 capsule (25 mg total) by mouth 3 (three) times daily as needed. Patient not taking: Reported on 04/23/2015 10/29/13   Janne Napoleon, NP  levothyroxine (SYNTHROID, LEVOTHROID) 150 MCG tablet Take 150 mcg by mouth daily before breakfast.    Historical Provider, MD  pantoprazole (PROTONIX) 40 MG tablet Take 40 mg by mouth daily.    Historical Provider, MD  predniSONE (DELTASONE) 10 MG tablet Take 2 tablets (20 mg total) by mouth daily. Patient not taking: Reported on 04/23/2015 02/24/15   [name], MD  simvastatin (ZOCOR) 20 MG tablet Take 20 mg by mouth at bedtime.     Historical Provider, MD   BP 110/65 mmHg  Pulse 87  Temp(Src) 98.1 F (36.7 C) (Oral)  Resp  20  Ht 5\' 2"  (1.575 m)  Wt 220 lb (99.791 kg)  BMI 40.23 kg/m2  SpO2 95% Physical Exam  Constitutional: She is oriented to person, place, and time. She appears well-developed and well-nourished.  HENT:  Head: Normocephalic and atraumatic.  Dry mucous membranes  Eyes: Conjunctivae are normal. Right eye exhibits no discharge. Left eye exhibits no discharge.  Neck: Normal range of  motion. Neck supple. No tracheal deviation present.  Cardiovascular: Normal rate and regular rhythm.   Pulmonary/Chest: Effort normal and breath sounds normal.  Abdominal: Soft. She exhibits no distension. There is tenderness (mild tenderness no peritonitis worse lower abdomen). There is no guarding.  Musculoskeletal: She exhibits no edema.  Neurological: She is alert and oriented to person, place, and time.  Gen. fatigue appearance, moves all extremities equal bilateral  Skin: Skin is warm. No rash noted.  Psychiatric: She has a normal mood and affect.  Nursing note and vitals reviewed.   ED Course  Procedures (including critical care time) Labs Review Labs Reviewed  CBC - Abnormal; Notable for the following:    RBC 5.19 (*)    Hemoglobin 15.8 (*)    HCT 46.6 (*)    All other components within normal limits  COMPREHENSIVE METABOLIC PANEL - Abnormal; Notable for the following:    Sodium 133 (*)    Potassium 2.8 (*)    Chloride 98 (*)    Glucose, Bld 103 (*)    BUN 65 (*)    Creatinine, Ser 1.24 (*)    Calcium 8.1 (*)    Total Protein 6.2 (*)    Albumin 2.6 (*)    AST 10 (*)    ALT 13 (*)    GFR calc non Af Amer 47 (*)    GFR calc Af Amer 55 (*)    All other components within normal limits  LIPASE, BLOOD - Abnormal; Notable for the following:    Lipase 16 (*)    All other components within normal limits  URINALYSIS, ROUTINE W REFLEX MICROSCOPIC - Abnormal; Notable for the following:    APPearance HAZY (*)    Hgb urine dipstick MODERATE (*)    Protein, ur 30 (*)    Leukocytes, UA MODERATE (*)    All other components within normal limits  URINE MICROSCOPIC-ADD ON - Abnormal; Notable for the following:    Squamous Epithelial / LPF MANY (*)    Bacteria, UA FEW (*)    All other components within normal limits  URINE CULTURE  MAGNESIUM    Imaging Review No results found.   EKG Interpretation None      MDM   Final diagnoses:  Nausea vomiting and diarrhea   Metabolic acidosis  Hypocalcemia  Hypokalemia  Other fatigue   Patient presents clinically with dehydration and general weakness concern for gastroenteritis likely secondarily to toxin from the food versus viral. Plan for IV fluids, blood work nausea meds and reassessment.  Lab results reviewed consistent with dehydration. Multiple IV fluid boluses given in ER. Discussed with hospitalist for admission for further treatment.  The patients results and plan were reviewed and discussed.   Any x-rays performed were personally reviewed by myself.   Differential diagnosis were considered with the presenting HPI.  Medications  fentaNYL (SUBLIMAZE) injection 50 mcg (50 mcg Intravenous Given 04/23/15 2016)  potassium chloride 10 mEq in 100 mL IVPB (10 mEq Intravenous New Bag/Given 04/23/15 2122)  calcium gluconate 1 g in sodium chloride 0.9 % 100 mL IVPB (not administered)  0.9 %  sodium chloride infusion ( Intravenous New Bag/Given 04/23/15 2123)  0.9 %  sodium chloride infusion (0 mLs Intravenous Stopped 04/23/15 1920)  sodium chloride 0.9 % bolus 1,000 mL (0 mLs Intravenous Stopped 04/23/15 2123)  ondansetron (ZOFRAN) injection 4 mg (4 mg Intravenous Given 04/23/15 2016)  potassium chloride 10 mEq in 100 mL IVPB (0 mEq Intravenous Stopped 04/23/15 2122)    Filed Vitals:   04/23/15 1806 04/23/15 1900 04/23/15 1940 04/23/15 2123  BP: 104/77 110/77 114/74 110/65  Pulse: 95 89 89 87  Temp: 98.2 F (36.8 C)  98.2 F (36.8 C) 98.1 F (36.7 C)  TempSrc: Oral  Oral Oral  Resp: 20  20 20   Height: 5\' 2"  (1.575 m)     Weight: 220 lb (99.791 kg)     SpO2: 96% 94% 100% 95%    Final diagnoses:  Nausea vomiting and diarrhea  Metabolic acidosis  Hypocalcemia  Hypokalemia  Other fatigue    Admission/ observation were discussed with the admitting physician, patient and/or family and they are comfortable with the plan.      [name], MD 04/23/15 2125

## 2015-04-24 DIAGNOSIS — R112 Nausea with vomiting, unspecified: Secondary | ICD-10-CM

## 2015-04-24 DIAGNOSIS — E876 Hypokalemia: Secondary | ICD-10-CM | POA: Diagnosis not present

## 2015-04-24 DIAGNOSIS — E039 Hypothyroidism, unspecified: Secondary | ICD-10-CM | POA: Diagnosis not present

## 2015-04-24 DIAGNOSIS — N179 Acute kidney failure, unspecified: Secondary | ICD-10-CM | POA: Diagnosis not present

## 2015-04-24 DIAGNOSIS — R197 Diarrhea, unspecified: Secondary | ICD-10-CM | POA: Diagnosis not present

## 2015-04-24 LAB — CBC
HCT: 42.2 % (ref 36.0–46.0)
Hemoglobin: 14.2 g/dL (ref 12.0–15.0)
MCH: 30.7 pg (ref 26.0–34.0)
MCHC: 33.6 g/dL (ref 30.0–36.0)
MCV: 91.1 fL (ref 78.0–100.0)
Platelets: 168 10*3/uL (ref 150–400)
RBC: 4.63 MIL/uL (ref 3.87–5.11)
RDW: 14 % (ref 11.5–15.5)
WBC: 6.1 10*3/uL (ref 4.0–10.5)

## 2015-04-24 LAB — COMPREHENSIVE METABOLIC PANEL
ALBUMIN: 2.3 g/dL — AB (ref 3.5–5.0)
ALT: 11 U/L — ABNORMAL LOW (ref 14–54)
ANION GAP: 7 (ref 5–15)
AST: 9 U/L — ABNORMAL LOW (ref 15–41)
Alkaline Phosphatase: 71 U/L (ref 38–126)
BUN: 43 mg/dL — AB (ref 6–20)
CO2: 23 mmol/L (ref 22–32)
CREATININE: 0.87 mg/dL (ref 0.44–1.00)
Calcium: 8.6 mg/dL — ABNORMAL LOW (ref 8.9–10.3)
Chloride: 107 mmol/L (ref 101–111)
GFR calc Af Amer: >60 mL/min (ref >60)
GFR calc non Af Amer: >60 mL/min (ref >60)
Glucose, Bld: 87 mg/dL (ref 70–99)
Potassium: 3 mmol/L — ABNORMAL LOW (ref 3.5–5.1)
Sodium: 137 mmol/L (ref 135–145)
TOTAL PROTEIN: 5.5 g/dL — AB (ref 6.5–8.1)
Total Bilirubin: 0.6 mg/dL (ref 0.3–1.2)

## 2015-04-24 MED ORDER — POTASSIUM CHLORIDE 10 MEQ/100ML IV SOLN
10.0000 meq | INTRAVENOUS | Status: AC
  Administered 2015-04-24 (×3): 10 meq via INTRAVENOUS
  Filled 2015-04-24 (×2): qty 100

## 2015-04-24 NOTE — Progress Notes (Signed)
Patient ID: SELENIA MIHOK, female   DOB: 1956-01-27, 59 y.o.   MRN: [id number] [facility] PROGRESS NOTE  AGNES BRIGHTBILL YQM:[id number] DOB: [birth year] DOA: 04/23/2015 PCP: No PCP Per Patient; we'll ask case management to establish primary care physician for the patient in the community.  Brief narrative:    59 year old female with past medical history of hypertension, hypothyroidism, history of rheumatoid arthritis, dyslipidemia who presented to [facility] ED with concern of ongoing nausea, vomiting and diarrhea for past couple of days prior to this admission. Patient reports having more than 5 bowel movements 1 day prior to this admission but it seems like it is slowing down. No reports of fevers or blood in the stool. On admission, patient was hemodynamically stable. Her blood work revealed potassium of 2.8 which was supplemented. She was also found to have acute renal failure, creatinine of 1.24. She was started on IV fluids and admitted for further evaluation and management.  Barriers to discharge: We will advance diet to clear liquid today. Hopefully diarrhea slows down and if patient tolerates diet, I anticipated discharge 04/25/2015.   Assessment/Plan:    Principal problem: Nausea, vomiting, diarrhea - Likely viral gastroenteritis. - C. difficile is pending. - No reports of fevers. White blood cell count is within normal limits. - Continue supportive care with IV fluids, antiemetics as needed. - Diet advanced to clear liquid today.  Active Problems: Hypokalemia - Secondary to GI losses. Potassium supplemented IV route. - Follow-up BMP tomorrow morning.  Hypothyroidism - Continue Synthroid  Acute renal failure - Secondary to prerenal causes, dehydration from GI losses - Continue IV fluids. - Creatinine improved with IV fluids.  Dyslipidemia - Continue Zocor 20 mg at bedtime   DVT Prophylaxis  - Lovenox subcutaneous ordered while patient in hospital.   Code Status:  Full.  Family Communication:  plan of care discussed with the patient Disposition Plan: Home likely 04/25/2015 if diarrhea improves and patient feels better.  IV access:  Peripheral IV  Procedures and diagnostic studies:    No results found.  Medical Consultants:  None  Other Consultants:  None  IAnti-Infectives:   None   Jonnathan Birman, MD  [facility] Pager [phone]  Time spent in minutes: 15 minutes  If 7PM-7AM, please contact night-coverage [email or web address] Password [email or web address] 04/24/2015, 11:15 AM   LOS: 1 day    HPI/Subjective: No acute overnight events. Patient reports ongoing diarrhea although improved since the admission.  Objective: Filed Vitals:   04/23/15 1940 04/23/15 2123 04/23/15 2207 04/24/15 0611  BP: 114/74 110/65 110/68 107/50  Pulse: 89 87 94 77  Temp: 98.2 F (36.8 C) 98.1 F (36.7 C) 98 F (36.7 C) 98.4 F (36.9 C)  TempSrc: Oral Oral Oral Oral  Resp: 20 20 20 20   Height:      Weight:   99.5 kg (219 lb 5.7 oz)   SpO2: 100% 95% 99% 95%    Intake/Output Summary (Last 24 hours) at 04/24/15 1115 Last data filed at 04/24/15 0356  Gross per 24 hour  Intake      0 ml  Output    400 ml  Net   -400 ml    Exam:   General:  Pt is alert, follows commands appropriately, not in acute distress  Cardiovascular: Regular rate and rhythm, S1/S2, no murmurs  Respiratory: Clear to auscultation bilaterally, no wheezing, no crackles, no rhonchi  Abdomen: Soft, non tender, non distended, bowel sounds present  Extremities: No edema, pulses DP and PT palpable bilaterally  Neuro: Grossly nonfocal  Data Reviewed: Basic Metabolic Panel:  Recent Labs Lab 04/23/15 1835 04/24/15 0603  NA 133* 137  K 2.8* 3.0*  CL 98* 107  CO2 25 23  GLUCOSE 103* 87  BUN 65* 43*  CREATININE 1.24* 0.87  CALCIUM 8.1* 8.6*  MG 1.9  --    Liver Function Tests:  Recent Labs Lab 04/23/15 1835 04/24/15 0603  AST 10* 9*  ALT 13* 11*  ALKPHOS 84 71  BILITOT  0.5 0.6  PROT 6.2* 5.5*  ALBUMIN 2.6* 2.3*    Recent Labs Lab 04/23/15 1835  LIPASE 16*   No results for input(s): AMMONIA in the last 168 hours. CBC:  Recent Labs Lab 04/23/15 1835 04/24/15 0603  WBC 7.2 6.1  HGB 15.8* 14.2  HCT 46.6* 42.2  MCV 89.8 91.1  PLT 189 168   Cardiac Enzymes: No results for input(s): CKTOTAL, CKMB, CKMBINDEX, TROPONINI in the last 168 hours. BNP: Invalid input(s): POCBNP CBG: No results for input(s): GLUCAP in the last 168 hours.  No results found for this or any previous visit (from the past 240 hour(s)).   Scheduled Meds: . enoxaparin (LOVENOX) injection  40 mg Subcutaneous Q24H  . levothyroxine  150 mcg Oral QAC breakfast  . pantoprazole  40 mg Oral Daily  . potassium chloride  10 mEq Intravenous Q1 Hr x 3  . simvastatin  20 mg Oral QHS   Continuous Infusions: . sodium chloride 100 mL/hr at 04/24/15 0405

## 2015-04-24 NOTE — Care Management Note (Signed)
Case Management Note  Patient Details  Name: [name] MRN: [id number] Date of Birth: [birth year]   Expected Discharge Date:                  Expected Discharge Plan:  Home/Self Care  In-House Referral:  NA  Discharge planning Services  CM Consult  Post Acute Care Choice:  NA Choice offered to:  NA  DME Arranged:    DME Agency:     HH Arranged:    HH Agency:     Status of Service:  Completed, signed off  Medicare Important Message Given:    Date Medicare IM Given:    Medicare IM give by:    Date Additional Medicare IM Given:    Additional Medicare Important Message give by:     If discussed at Long Length of Stay Meetings, dates discussed:    Additional Comments: Pt is from home and independent at baseline. Pt has no HH services or DME's prior to admission. CM consult placed due to PCP needs. Pt states she does have a PCP and goes to [facility] and see's [name], [place]. No further CM needs.  Malcolm Metro, RN 04/24/2015, 1:03 PM

## 2015-04-25 DIAGNOSIS — E876 Hypokalemia: Secondary | ICD-10-CM | POA: Diagnosis not present

## 2015-04-25 DIAGNOSIS — R112 Nausea with vomiting, unspecified: Secondary | ICD-10-CM | POA: Diagnosis not present

## 2015-04-25 DIAGNOSIS — N179 Acute kidney failure, unspecified: Secondary | ICD-10-CM | POA: Diagnosis not present

## 2015-04-25 DIAGNOSIS — R197 Diarrhea, unspecified: Secondary | ICD-10-CM | POA: Diagnosis not present

## 2015-04-25 LAB — URINE CULTURE: Colony Count: >=100000

## 2015-04-25 MED ORDER — POTASSIUM CHLORIDE CRYS ER 20 MEQ PO TBCR
40.0000 meq | EXTENDED_RELEASE_TABLET | Freq: Once | ORAL | Status: AC
  Administered 2015-04-25: 40 meq via ORAL
  Filled 2015-04-25: qty 2

## 2015-04-25 NOTE — Care Management Note (Signed)
Case Management Note  Patient Details  Name: [name] MRN: [id number] Date of Birth: 02-15-1956  Expected Discharge Date:                  Expected Discharge Plan:  Home/Self Care  In-House Referral:  NA  Discharge planning Services  CM Consult  Post Acute Care Choice:  NA Choice offered to:  NA  DME Arranged:    DME Agency:     HH Arranged:    HH Agency:     Status of Service:  Completed, signed off  Medicare Important Message Given:    Date Medicare IM Given:    Medicare IM give by:    Date Additional Medicare IM Given:    Additional Medicare Important Message give by:     If discussed at Long Length of Stay Meetings, dates discussed:    Additional Comments: Pt discharging home with self care today, no CM needs.   Malcolm Metro, RN 04/25/2015, 9:40 AM

## 2015-04-25 NOTE — Progress Notes (Signed)
Patient with order to be discharge home. Discharge instructions given, patient verbalized understanding. Patient stable. Patient left in private vehicle with husband.

## 2015-04-25 NOTE — Discharge Instructions (Signed)

## 2015-04-25 NOTE — Discharge Summary (Signed)
Physician Discharge Summary  [name] LPF:[id number] DOB: 1956/10/14 DOA: 04/23/2015  PCP: No PCP Per Patient, Marilyn Herald, PA  Admit date: 04/23/2015 Discharge date: 04/25/2015  Recommendations for Outpatient Follow-up:  1. Patient will follow-up with primary care physician in one week per scheduled appointment. 2. No new changes in medications on discharge.  Discharge Diagnoses:  Active Problems:   Hypothyroidism   Hypokalemia   Diarrhea   Acute kidney injury    Discharge Condition: stable   Diet recommendation: as tolerated   History of present illness: 59 year old female with past medical history of hypertension, hypothyroidism, history of rheumatoid arthritis, dyslipidemia who presented to [facility] with concern of ongoing nausea, vomiting and diarrhea for past couple of days prior to this admission. Patient reports having more than 5 bowel movements 1 day prior to this admission but it seems like it is slowing down. No reports of fevers or blood in the stool.  On admission, patient was hemodynamically stable. Her blood work revealed potassium of 2.8 which was supplemented. She was also found to have acute renal failure, creatinine of 1.24. She was started on IV fluids and admitted for further evaluation and management.   Assessment/Plan:    Principal problem: Nausea, vomiting, diarrhea - Likely viral gastroenteritis. - C. Difficile PCR not collected (?not adequate specimen); diarrhea significantly improved - No reports of fevers. White blood cell count is within normal limits. - Diet - regular today   Active Problems: Hypokalemia - Secondary to GI losses. Potassium supplemented IV route and by mouth potassium given prior to discharge.  Hypothyroidism - Continue Synthroid on discharge.  Acute renal failure - Secondary to prerenal causes, dehydration from GI losses - Creatinine resolved to normal with IV fluids.  Dyslipidemia - Continue Zocor 20 mg at  bedtime.   DVT Prophylaxis  - Lovenox subcutaneous ordered while patient in hospital.   Code Status: Full.  Family Communication: plan of care discussed with the patient  IV access:  Peripheral IV  Procedures and diagnostic studies:   No results found.  Medical Consultants:  None  Other Consultants:  None  IAnti-Infectives:   None    Signed:  Manson Passey, MD  [facility] 04/25/2015, 9:28 AM  Pager #: [phone]  Time spent in minutes: less than 30 minutes  Discharge Exam: Filed Vitals:   04/25/15 0443  BP: 122/70  Pulse: 79  Temp: 98.2 F (36.8 C)  Resp: 20   Filed Vitals:   04/24/15 0611 04/24/15 1500 04/24/15 2236 04/25/15 0443  BP: 107/50 111/70 125/69 122/70  Pulse: 77 78 79 79  Temp: 98.4 F (36.9 C) 98.3 F (36.8 C) 99.2 F (37.3 C) 98.2 F (36.8 C)  TempSrc: Oral Oral Oral Oral  Resp: 20 20 20 20   Height:      Weight:      SpO2: 95% 96% 95% 99%    General: Pt is alert, follows commands appropriately, not in acute distress Cardiovascular: Regular rate and rhythm, S1/S2 +, no murmurs Respiratory: Clear to auscultation bilaterally, no wheezing, no crackles, no rhonchi Abdominal: Soft, non tender, non distended, bowel sounds +, no guarding Extremities: no edema, no cyanosis, pulses palpable bilaterally DP and PT Neuro: Grossly nonfocal  Discharge Instructions  Discharge Instructions    Call MD for:  difficulty breathing, headache or visual disturbances    Complete by:  As directed      Call MD for:  persistant nausea and vomiting    Complete by:  As directed  Call MD for:  severe uncontrolled pain    Complete by:  As directed      Diet - low sodium heart healthy    Complete by:  As directed      Increase activity slowly    Complete by:  As directed             Medication List    STOP taking these medications        HYDROcodone-acetaminophen 5-325 MG per tablet  Commonly known as:  NORCO      indomethacin 25 MG capsule  Commonly known as:  INDOCIN     pantoprazole 40 MG tablet  Commonly known as:  PROTONIX     predniSONE 10 MG tablet  Commonly known as:  DELTASONE      TAKE these medications        hydrochlorothiazide 12.5 MG capsule  Commonly known as:  MICROZIDE  Take 12.5 mg by mouth daily.     levothyroxine 150 MCG tablet  Commonly known as:  SYNTHROID, LEVOTHROID  Take 150 mcg by mouth daily before breakfast.     simvastatin 20 MG tablet  Commonly known as:  ZOCOR  Take 20 mg by mouth at bedtime.           Follow-up Information    Follow up with [facility], BENJAMIN, PA-C.   Specialties:  Physician Assistant, Internal Medicine   Contact information:   [address] [place] [address] [phone]        The results of significant diagnostics from this hospitalization (including imaging, microbiology, ancillary and laboratory) are listed below for reference.    Significant Diagnostic Studies: No results found.  Microbiology: No results found for this or any previous visit (from the past 240 hour(s)).   Labs: Basic Metabolic Panel:  Recent Labs Lab 04/23/15 1835 04/24/15 0603  NA 133* 137  K 2.8* 3.0*  CL 98* 107  CO2 25 23  GLUCOSE 103* 87  BUN 65* 43*  CREATININE 1.24* 0.87  CALCIUM 8.1* 8.6*  MG 1.9  --    Liver Function Tests:  Recent Labs Lab 04/23/15 1835 04/24/15 0603  AST 10* 9*  ALT 13* 11*  ALKPHOS 84 71  BILITOT 0.5 0.6  PROT 6.2* 5.5*  ALBUMIN 2.6* 2.3*    Recent Labs Lab 04/23/15 1835  LIPASE 16*   No results for input(s): AMMONIA in the last 168 hours. CBC:  Recent Labs Lab 04/23/15 1835 04/24/15 0603  WBC 7.2 6.1  HGB 15.8* 14.2  HCT 46.6* 42.2  MCV 89.8 91.1  PLT 189 168   Cardiac Enzymes: No results for input(s): CKTOTAL, CKMB, CKMBINDEX, TROPONINI in the last 168 hours. BNP: BNP (last 3 results) No results for input(s): BNP in the last 8760 hours.  ProBNP (last 3 results) No  results for input(s): PROBNP in the last 8760 hours.  CBG: No results for input(s): GLUCAP in the last 168 hours.

## 2015-07-17 ENCOUNTER — Encounter: Payer: 59 | Admitting: Cardiology

## 2015-07-17 NOTE — Progress Notes (Signed)
Patient ID: [name], female   DOB: 03/24/1956, 59 y.o.   MRN: [id number]     Clinical Summary [name] is a 59 y.o.female seen today as a new patient for the following medical problems.  1. Murmur - LE edema   Past Medical History  Diagnosis Date  . Rheumatoid arthritis(714.0)   . Hypercholesteremia   . Hypothyroid      Allergies  Allergen Reactions  . Latex Hives and Itching    blisters  . Oxycodone-Acetaminophen Nausea Only  . Adhesive [Tape] Rash    Patient prefers paper tape.   Marland Kitchen Penicillins Swelling and Rash  . Strawberry Itching and Rash     Current Outpatient Prescriptions  Medication Sig Dispense Refill  . hydrochlorothiazide (MICROZIDE) 12.5 MG capsule Take 12.5 mg by mouth daily.    Marland Kitchen levothyroxine (SYNTHROID, LEVOTHROID) 150 MCG tablet Take 150 mcg by mouth daily before breakfast.    . simvastatin (ZOCOR) 20 MG tablet Take 20 mg by mouth at bedtime.      No current facility-administered medications for this visit.     Past Surgical History  Procedure Laterality Date  . Hand surgery    . Tubal ligation    . Tonsillectomy    . Heel spur surgery    . Cholecystectomy    . Elbow surgery    . Exploratory laparotomy    . Partial thyroidectomy  2015     Allergies  Allergen Reactions  . Latex Hives and Itching    blisters  . Oxycodone-Acetaminophen Nausea Only  . Adhesive [Tape] Rash    Patient prefers paper tape.   Marland Kitchen Penicillins Swelling and Rash  . Strawberry Itching and Rash      No family history on file.   Social History [name] reports that she quit smoking about 16 months ago. Her smoking use included Cigarettes. She has a 40 pack-year smoking history. She has never used smokeless tobacco. [name] reports that she does not drink alcohol.   Review of Systems CONSTITUTIONAL: No weight loss, fever, chills, weakness or fatigue.  HEENT: Eyes: No visual loss, blurred vision, double vision or yellow sclerae.No hearing  loss, sneezing, congestion, runny nose or sore throat.  SKIN: No rash or itching.  CARDIOVASCULAR:  RESPIRATORY: No shortness of breath, cough or sputum.  GASTROINTESTINAL: No anorexia, nausea, vomiting or diarrhea. No abdominal pain or blood.  GENITOURINARY: No burning on urination, no polyuria NEUROLOGICAL: No headache, dizziness, syncope, paralysis, ataxia, numbness or tingling in the extremities. No change in bowel or bladder control.  MUSCULOSKELETAL: No muscle, back pain, joint pain or stiffness.  LYMPHATICS: No enlarged nodes. No history of splenectomy.  PSYCHIATRIC: No history of depression or anxiety.  ENDOCRINOLOGIC: No reports of sweating, cold or heat intolerance. No polyuria or polydipsia.  Marland Kitchen   Physical Examination There were no vitals filed for this visit. There were no vitals filed for this visit.  Gen: resting comfortably, no acute distress HEENT: no scleral icterus, pupils equal round and reactive, no palptable cervical adenopathy,  CV Resp: Clear to auscultation bilaterally GI: abdomen is soft, non-tender, non-distended, normal bowel sounds, no hepatosplenomegaly MSK: extremities are warm, no edema.  Skin: warm, no rash Neuro:  no focal deficits Psych: appropriate affect   Diagnostic Studies     Assessment and Plan        Antoine Poche, M.D., F.A.C.C.

## 2015-11-11 ENCOUNTER — Emergency Department (HOSPITAL_COMMUNITY): Payer: 59

## 2015-11-11 ENCOUNTER — Emergency Department (HOSPITAL_COMMUNITY)
Admission: EM | Admit: 2015-11-11 | Discharge: 2015-11-11 | Disposition: A | Discharge status: Home / Self Care | Payer: 59 | Attending: Emergency Medicine | Admitting: Emergency Medicine

## 2015-11-11 DIAGNOSIS — M069 Rheumatoid arthritis, unspecified: Secondary | ICD-10-CM | POA: Insufficient documentation

## 2015-11-11 DIAGNOSIS — Z87891 Personal history of nicotine dependence: Secondary | ICD-10-CM | POA: Insufficient documentation

## 2015-11-11 DIAGNOSIS — M25562 Pain in left knee: Secondary | ICD-10-CM | POA: Diagnosis present

## 2015-11-11 DIAGNOSIS — E039 Hypothyroidism, unspecified: Secondary | ICD-10-CM | POA: Diagnosis not present

## 2015-11-11 DIAGNOSIS — E7800 Pure hypercholesterolemia, unspecified: Secondary | ICD-10-CM | POA: Insufficient documentation

## 2015-11-11 DIAGNOSIS — Z79899 Other long term (current) drug therapy: Secondary | ICD-10-CM | POA: Diagnosis not present

## 2015-11-11 DIAGNOSIS — M1712 Unilateral primary osteoarthritis, left knee: Secondary | ICD-10-CM

## 2015-11-11 DIAGNOSIS — Z880 Allergy status to penicillin: Secondary | ICD-10-CM | POA: Insufficient documentation

## 2015-11-11 MED ORDER — HYDROCODONE-ACETAMINOPHEN 5-325 MG PO TABS
2.0000 | ORAL_TABLET | Freq: Once | ORAL | Status: AC
  Administered 2015-11-11: 2 via ORAL
  Filled 2015-11-11: qty 2

## 2015-11-11 MED ORDER — HYDROCODONE-ACETAMINOPHEN 5-325 MG PO TABS: 1.0000 | ORAL_TABLET | ORAL | Status: DC | PRN

## 2015-11-11 MED ORDER — PREDNISONE 10 MG PO TABS: ORAL_TABLET | ORAL | Status: DC

## 2015-11-11 MED ORDER — PREDNISONE 50 MG PO TABS
60.0000 mg | ORAL_TABLET | Freq: Once | ORAL | Status: AC
  Administered 2015-11-11: 60 mg via ORAL
  Filled 2015-11-11: qty 1

## 2015-11-11 NOTE — ED Notes (Signed)
Pt c/o of left knee pain and swelling which began last night. History of arthritis.

## 2015-11-11 NOTE — ED Notes (Signed)
Pt assisted too the bathroom - Stood with stand by assistance .

## 2015-11-11 NOTE — ED Notes (Signed)
Pt taken to Xray at this time, family member escorted to pt's room

## 2015-11-11 NOTE — Discharge Instructions (Signed)
Arthritis Arthritis is a term that is commonly used to refer to joint pain or joint disease. There are more than 100 types of arthritis. CAUSES The most common cause of this condition is wear and tear of a joint. Other causes include:  Gout.  Inflammation of a joint.  An infection of a joint.  Sprains and other injuries near the joint.  A drug reaction or allergic reaction. In some cases, the cause may not be known. SYMPTOMS The main symptom of this condition is pain in the joint with movement. Other symptoms include:  Redness, swelling, or stiffness at a joint.  Warmth coming from the joint.  Fever.  Overall feeling of illness. DIAGNOSIS This condition may be diagnosed with a physical exam and tests, including:  Blood tests.  Urine tests.  Imaging tests, such as MRI, X-rays, or a CT scan. Sometimes, fluid is removed from a joint for testing. TREATMENT Treatment for this condition may involve:  Treatment of the cause, if it is known.  Rest.  Raising (elevating) the joint.  Applying cold or hot packs to the joint.  Medicines to improve symptoms and reduce inflammation.  Injections of a steroid such as cortisone into the joint to help reduce pain and inflammation. Depending on the cause of your arthritis, you may need to make lifestyle changes to reduce stress on your joint. These changes may include exercising more and losing weight. HOME CARE INSTRUCTIONS Medicines  Take over-the-counter and prescription medicines only as told by your health care provider.  Do not take aspirin to relieve pain if gout is suspected. Activities  Rest your joint if told by your health care provider. Rest is important when your disease is active and your joint feels painful, swollen, or stiff.  Avoid activities that make the pain worse. It is important to balance activity with rest.  Exercise your joint regularly with range-of-motion exercises as told by your health care  provider. Try doing low-impact exercise, such as:  Swimming.  Water aerobics.  Biking.  Walking. Joint Care  If your joint is swollen, keep it elevated if told by your health care provider.  If your joint feels stiff in the morning, try taking a warm shower.  If directed, apply heat to the joint. If you have diabetes, do not apply heat without permission from your health care provider.  Put a towel between the joint and the hot pack or heating pad.  Leave the heat on the area for 20-30 minutes.  If directed, apply ice to the joint:  Put ice in a plastic bag.  Place a towel between your skin and the bag.  Leave the ice on for 20 minutes, 2-3 times per day.  Keep all follow-up visits as told by your health care provider. This is important. SEEK MEDICAL CARE IF:  The pain gets worse.  You have a fever. SEEK IMMEDIATE MEDICAL CARE IF:  You develop severe joint pain, swelling, or redness.  Many joints become painful and swollen.  You develop severe back pain.  You develop severe weakness in your leg.  You cannot control your bladder or bowels.   This information is not intended to replace advice given to you by your health care provider. Make sure you discuss any questions you have with your health care provider.   Document Released: 01/09/2005 Document Revised: 08/23/2015 Document Reviewed: 02/27/2015 Elsevier Interactive Patient Education 2016 [employer].   Take your next dose of prednisone tomorrow evening.  You may take the  hydrocodone prescribed for pain relief.  This will make you drowsy - do not drive within 4 hours of taking this medication.

## 2015-11-11 NOTE — ED Notes (Signed)
Discharge instructions reviewed with pt and her son - Discussed side effects of frequent pain med usage and pt stated that she will take some stool softeners. Work note provided. And wheeled off unit by son .

## 2015-11-13 NOTE — ED Provider Notes (Signed)
CSN: [id number]     Arrival date & time 11/11/15  1421 History   First MD Initiated Contact with Patient 11/11/15 1607     Chief Complaint  Patient presents with  . Knee Pain     (Consider location/radiation/quality/duration/timing/severity/associated sxs/prior Treatment) Patient is a 59 y.o. female presenting with knee pain. The history is provided by the patient.  Knee Pain Location:  Knee Time since incident:  1 day Injury: no   Knee location:  L knee Pain details:    Quality:  Aching   Radiates to:  Does not radiate   Severity:  Moderate   Onset quality:  Gradual   Duration:  1 day   Timing:  Constant   Progression:  Worsening Chronicity:  Recurrent Dislocation: no   Foreign body present:  No foreign bodies Prior injury to area:  No (Pt has history of rheumatoid arthritis with current flare similar to prior rheumatoid pain.Was under Dr. Fayrene Fearing care until he retired.  Has not establish with a new rheumatologist.) Relieved by:  Rest and heat Worsened by:  Bearing weight and activity Ineffective treatments: she has taken tylenol and motrin without relief. Associated symptoms: decreased ROM and swelling   Associated symptoms: no fever, no numbness and no stiffness     Past Medical History  Diagnosis Date  . Rheumatoid arthritis(714.0)   . Hypercholesteremia   . Hypothyroid    Past Surgical History  Procedure Laterality Date  . Hand surgery    . Tubal ligation    . Tonsillectomy    . Heel spur surgery    . Cholecystectomy    . Elbow surgery    . Exploratory laparotomy    . Partial thyroidectomy  2015   No family history on file. Social History  Substance Use Topics  . Smoking status: Former Smoker -- 1.00 packs/day for 40 years    Types: Cigarettes    Quit date: 02/23/2014  . Smokeless tobacco: Never Used  . Alcohol Use: No   OB History    No data available     Review of Systems  Constitutional: Negative for fever.  Musculoskeletal: Positive for  joint swelling and arthralgias. Negative for myalgias and stiffness.  Neurological: Negative for weakness and numbness.      Allergies  Latex; Oxycodone-acetaminophen; Adhesive; Penicillins; and Strawberry extract  Home Medications   Prior to Admission medications   Medication Sig Start Date End Date Taking? Authorizing Provider  hydrochlorothiazide (MICROZIDE) 12.5 MG capsule Take 12.5 mg by mouth daily.    Historical Provider, MD  HYDROcodone-acetaminophen (NORCO/VICODIN) 5-325 MG tablet Take 1 tablet by mouth every 4 (four) hours as needed. 11/11/15   Burgess Amor, PA-C  levothyroxine (SYNTHROID, LEVOTHROID) 150 MCG tablet Take 150 mcg by mouth daily before breakfast.    Historical Provider, MD  predniSONE (DELTASONE) 10 MG tablet 6, 5, 4, 3, 2 then 1 tablet by mouth daily for 6 days total. 11/11/15   Burgess Amor, PA-C  simvastatin (ZOCOR) 20 MG tablet Take 20 mg by mouth at bedtime.     Historical Provider, MD   BP 142/69 mmHg  Pulse 71  Temp(Src) 97.7 F (36.5 C) (Oral)  Resp 18  Ht 5\' 3"  (1.6 m)  Wt 102.059 kg  BMI 39.87 kg/m2  SpO2 99% Physical Exam  Constitutional: She appears well-developed and well-nourished.  HENT:  Head: Atraumatic.  Neck: Normal range of motion.  Cardiovascular:  Pulses equal bilaterally  Musculoskeletal: She exhibits edema and tenderness.  Left knee: She exhibits swelling. She exhibits no effusion, no erythema, normal alignment, no LCL laxity, normal meniscus and no MCL laxity. Tenderness found. Medial joint line and lateral joint line tenderness noted.  ttp with mild generalized edema of the left knee in comparison to the right. No erythema, no red streaking.  Pt displays ROM, flexion beyond 45 degrees inccreases pain.  Dorsalis pedal pulse intact.  Neurological: She is alert. She has normal strength. She displays normal reflexes. No sensory deficit.  Skin: Skin is warm and dry.  Psychiatric: She has a normal mood and affect.    ED Course   Procedures (including critical care time) Labs Review Labs Reviewed - No data to display  Imaging Review No results found. I have personally reviewed and evaluated these images and lab results as part of my medical decision-making.   EKG Interpretation None      MDM   Final diagnoses:  Osteoarthritis of left knee, unspecified osteoarthritis type    Pt with h/o rheumatoid arthritis, with osteoarthritis based on imaging today with decreased joint space height, medial compartment djd.  No finding to suggest septic joint or gout, pt denies trauma.  Discussed with patient, advised f/u with ortho for further eval/tx. She was placed on a prednisone taper, hydrocodone.  Reports prednisone has greatly helped flares in the past. Heat tx, elevation.   The patient appears reasonably screened and/or stabilized for discharge and I doubt any other medical condition or other [facility] requiring further screening, evaluation, or treatment in the ED at this time prior to discharge.     Burgess Amor, PA-C 11/14/15 0010  Samuel Jester, DO 11/14/15 2004

## 2015-12-27 ENCOUNTER — Encounter: Payer: Self-pay | Admitting: *Deleted

## 2016-07-20 ENCOUNTER — Emergency Department (HOSPITAL_COMMUNITY)
Admission: EM | Admit: 2016-07-20 | Discharge: 2016-07-21 | Disposition: A | Discharge status: Home / Self Care | Payer: Self-pay | Attending: Emergency Medicine | Admitting: Emergency Medicine

## 2016-07-20 ENCOUNTER — Emergency Department (HOSPITAL_COMMUNITY): Payer: Self-pay

## 2016-07-20 ENCOUNTER — Encounter (HOSPITAL_COMMUNITY): Payer: Self-pay

## 2016-07-20 DIAGNOSIS — Z79899 Other long term (current) drug therapy: Secondary | ICD-10-CM | POA: Insufficient documentation

## 2016-07-20 DIAGNOSIS — Z7982 Long term (current) use of aspirin: Secondary | ICD-10-CM | POA: Insufficient documentation

## 2016-07-20 DIAGNOSIS — M1711 Unilateral primary osteoarthritis, right knee: Secondary | ICD-10-CM | POA: Insufficient documentation

## 2016-07-20 DIAGNOSIS — E039 Hypothyroidism, unspecified: Secondary | ICD-10-CM | POA: Insufficient documentation

## 2016-07-20 DIAGNOSIS — I10 Essential (primary) hypertension: Secondary | ICD-10-CM | POA: Insufficient documentation

## 2016-07-20 DIAGNOSIS — Z87891 Personal history of nicotine dependence: Secondary | ICD-10-CM | POA: Insufficient documentation

## 2016-07-20 LAB — BASIC METABOLIC PANEL
ANION GAP: 6 (ref 5–15)
BUN: 31 mg/dL — ABNORMAL HIGH (ref 6–20)
CALCIUM: 8.9 mg/dL (ref 8.9–10.3)
CHLORIDE: 100 mmol/L — AB (ref 101–111)
CO2: 26 mmol/L (ref 22–32)
Creatinine, Ser: 1.1 mg/dL — ABNORMAL HIGH (ref 0.44–1.00)
GFR calc non Af Amer: 54 mL/min — ABNORMAL LOW (ref >60)
Glucose, Bld: 99 mg/dL (ref 65–99)
Potassium: 3.4 mmol/L — ABNORMAL LOW (ref 3.5–5.1)
Sodium: 132 mmol/L — ABNORMAL LOW (ref 135–145)

## 2016-07-20 LAB — CBC WITH DIFFERENTIAL/PLATELET
BASOS ABS: 0 10*3/uL (ref 0.0–0.1)
BASOS PCT: 0 %
EOS PCT: 2 %
Eosinophils Absolute: 0.2 10*3/uL (ref 0.0–0.7)
HEMATOCRIT: 39.4 % (ref 36.0–46.0)
Hemoglobin: 12.8 g/dL (ref 12.0–15.0)
LYMPHS PCT: 11 %
Lymphs Abs: 1.2 10*3/uL (ref 0.7–4.0)
MCH: 29.8 pg (ref 26.0–34.0)
MCHC: 32.5 g/dL (ref 30.0–36.0)
MCV: 91.8 fL (ref 78.0–100.0)
MONO ABS: 1 10*3/uL (ref 0.1–1.0)
Monocytes Relative: 9 %
Neutro Abs: 9.1 10*3/uL — ABNORMAL HIGH (ref 1.7–7.7)
Neutrophils Relative %: 78 %
Platelets: 191 10*3/uL (ref 150–400)
RBC: 4.29 MIL/uL (ref 3.87–5.11)
RDW: 14.1 % (ref 11.5–15.5)
WBC: 11.5 10*3/uL — AB (ref 4.0–10.5)

## 2016-07-20 MED ORDER — HYDROCODONE-ACETAMINOPHEN 5-325 MG PO TABS
2.0000 | ORAL_TABLET | Freq: Once | ORAL | Status: AC
  Administered 2016-07-20: 2 via ORAL
  Filled 2016-07-20: qty 2

## 2016-07-20 NOTE — ED Notes (Signed)
Pt returned from xray, states that the pain medication has not helped,

## 2016-07-20 NOTE — ED Triage Notes (Signed)
Complains of right knee pain that started today. Has RA. States she has taken OTC medications with no relief. Unable to walk per patient.

## 2016-07-20 NOTE — ED Notes (Signed)
Patient transported to X-ray 

## 2016-07-20 NOTE — ED Provider Notes (Signed)
[facility] Provider Note   CSN: [id number] Arrival date & time: 07/20/16  2115  First Provider Contact:   First MD Initiated Contact with Patient 07/20/16 2223     By signing my name below, I, Majel Homer, attest that this documentation has been prepared under the direction and in the presence of Lavera Guise, MD . Electronically Signed: Majel Homer, Scribe. 07/20/2016. 10:31 PM.  History   Chief Complaint Chief Complaint  Patient presents with  . Knee Pain   The history is provided by the patient. No language interpreter was used.   HPI Comments: [name] is a 60 y.o. female with PMHx of HLD and rheumatoid arthritis, who presents to the Emergency Department complaining of gradually worsening, 10/10, right knee pain and swelling  that began 2-3 years ago and worsened this evening. She states she was working and on her feet at [employer] at the onset of her worsening pain. Pt reports she cannot walk, bear weight or straighten her right leg. She notes associated intermittent swelling in her bilateral feet. Pt states she took several aleve earlier today with no relief. She notes she is usually given prednisone for her pain in the ED; she states she used to take multiple different medications to manage her pain but not anymore since she lost her insurance. She denies fever, chills, nausea, vomiting and redness to her right knee.   Past Medical History:  Diagnosis Date  . Hypercholesteremia   . Hypothyroid   . Rheumatoid arthritis(714.0)    Patient Active Problem List   Diagnosis Date Noted  . Hypokalemia 04/23/2015  . Diarrhea 04/23/2015  . Acute kidney injury (HCC) 04/23/2015  . LIBIDO, DECREASED 12/21/2008  . FATIGUE 09/06/2008  . LEG EDEMA 07/21/2008  . VITAMIN B12 DEFICIENCY 07/08/2008  . Rheumatoid arthritis (HCC) 07/08/2008  . OBESITY 05/04/2008  . HYPERTENSION 05/04/2008  . ALLERGIC RHINITIS 05/04/2008  . GERD 05/04/2008  . ELBOW PAIN, LEFT 05/04/2008  . URINARY  INCONTINENCE 05/04/2008  . VARICOSE VEINS LOWER EXTREMITIES W/INFLAMMATION 11/02/2007  . Hypothyroidism 08/18/2007    Past Surgical History:  Procedure Laterality Date  . CHOLECYSTECTOMY    . ELBOW SURGERY    . EXPLORATORY LAPAROTOMY    . HAND SURGERY    . HEEL SPUR SURGERY    . partial thyroidectomy  2015  . TONSILLECTOMY    . TUBAL LIGATION      OB History    No data available     Home Medications    Prior to Admission medications   Medication Sig Start Date End Date Taking? Authorizing Provider  aspirin EC 81 MG tablet Take 81 mg by mouth daily.   Yes Historical Provider, MD  hydrochlorothiazide (HYDRODIURIL) 25 MG tablet Take 25 mg by mouth daily.  05/21/16  Yes Historical Provider, MD  levothyroxine (SYNTHROID, LEVOTHROID) 150 MCG tablet Take 150 mcg by mouth daily before breakfast.   Yes Historical Provider, MD  naproxen sodium (ALEVE) 220 MG tablet Take 880 mg by mouth daily as needed (for pain).   Yes Historical Provider, MD  pantoprazole (PROTONIX) 40 MG tablet Take 40 mg by mouth 2 (two) times daily.  04/17/16  Yes Historical Provider, MD  simvastatin (ZOCOR) 20 MG tablet Take 20 mg by mouth at bedtime.    Yes Historical Provider, MD  HYDROcodone-acetaminophen (NORCO/VICODIN) 5-325 MG tablet Take 1-2 tablets by mouth every 6 (six) hours as needed. 07/21/16   Lavera Guise, MD  predniSONE (STERAPRED UNI-PAK 21 TAB)  10 MG (21) TBPK tablet Take 1 tablet (10 mg total) by mouth daily. Take 6 tabs by mouth daily  for 2 days, then 5 tabs for 2 days, then 4 tabs for 2 days, then 3 tabs for 2 days, 2 tabs for 2 days, then 1 tab by mouth daily for 2 days 07/21/16   Lavera Guise, MD   Family History No family history on file.  Social History Social History  Substance Use Topics  . Smoking status: Former Smoker    Packs/day: 1.00    Years: 40.00    Types: Cigarettes    Quit date: 02/23/2014  . Smokeless tobacco: Never Used  . Alcohol use No   Allergies   Latex;  Oxycodone-acetaminophen; Adhesive [tape]; Penicillins; and Strawberry extract   Review of Systems Review of Systems 10/14 systems reviewed and all are negative for acute change except as noted in the HPI.  Physical Exam Updated Vital Signs BP 136/77 (BP Location: Left Arm)   Pulse 82   Temp 98.1 F (36.7 C) (Oral)   Resp 20   Ht 5\' 2"  (1.575 m)   Wt 260 lb (117.9 kg)   SpO2 100%   BMI 47.55 kg/m   Physical Exam Physical Exam  Nursing note and vitals reviewed. Constitutional: Well developed, well nourished, non-toxic, and in no acute distress Head: Normocephalic and atraumatic.  Mouth/Throat: Oropharynx is clear and moist.  Neck: Normal range of motion. Neck supple.  Cardiovascular: Normal rate and regular rhythm.  +2 DP pulses bilaterally.  Pulmonary/Chest: Effort normal and breath sounds normal.  Abdominal: Soft. There is no tenderness. There is no rebound and no guarding.  Musculoskeletal: Limited ROM of the right knee due to pain, mild swelling to the right knee, no overlying warmth or erythema  Neurological: Alert, no facial droop, fluent speech, moves all extremities symmetrically. Sensation to light touch intact in BLE Skin: Skin is warm and dry.  Psychiatric: Cooperative  ED Treatments / Results  Labs (all labs ordered are listed, but only abnormal results are displayed) Labs Reviewed  CBC WITH DIFFERENTIAL/PLATELET - Abnormal; Notable for the following:       Result Value   WBC 11.5 (*)    Neutro Abs 9.1 (*)    All other components within normal limits  BASIC METABOLIC PANEL - Abnormal; Notable for the following:    Sodium 132 (*)    Potassium 3.4 (*)    Chloride 100 (*)    BUN 31 (*)    Creatinine, Ser 1.10 (*)    GFR calc non Af Amer 54 (*)    All other components within normal limits    EKG  EKG Interpretation None       Radiology Dg Knee Complete 4 Views Right  Result Date: 07/20/2016 CLINICAL DATA:  60 year old female with sudden onset of  right-sided knee pain. EXAM: RIGHT KNEE - COMPLETE 4+ VIEW COMPARISON:  No priors. FINDINGS: Four views of the right knee demonstrate no acute displaced fracture with dislocation. There is severe joint space narrowing, subchondral sclerosis, subchondral cyst formation and osteophyte formation, most pronounced in the medial and patellofemoral compartments, compatible with osteoarthritis. IMPRESSION: 1. No acute radiographic abnormality of the right knee. 2. Severe tricompartmental osteoarthritis, most severe in the medial and patellofemoral compartments. Electronically Signed   By: 42 M.D.   On: 07/20/2016 23:52    Procedures Procedures  DIAGNOSTIC STUDIES:  Oxygen Saturation is 100% on RA, normal by my interpretation.    COORDINATION  OF CARE:  10:28 PM Discussed treatment plan, which includes X-ray of right knee with pt at bedside and pt agreed to plan.   Medications Ordered in ED Medications  HYDROcodone-acetaminophen (NORCO/VICODIN) 5-325 MG per tablet 2 tablet (2 tablets Oral Given 07/20/16 2235)  HYDROmorphone (DILAUDID) injection 1 mg (1 mg Intramuscular Given 07/21/16 0039)    Initial Impression / Assessment and Plan / ED Course  I have reviewed the triage vital signs and the nursing notes.  Pertinent labs & imaging results that were available during my care of the patient were reviewed by me and considered in my medical decision making (see chart for details).  Clinical Course   60 year old female w/ RA and OA who presents with right knee pain, exacerbating by prolonged standing on her job. Well appearing, appears uncomfortable secondary to pain. Afebrile, no warmth erythema or significant effusion of the knee or other infectious symptoms to suggest septic arthritis. Xr knee w/ severe arthritis. Will give steroid taper and 2 day course of vicodin. I have reviewed her information on the [place] CSRS and she has PCP who is prescribing routine tramadol for her. NO frequent  narcotic prescriptions and no multiple providers. She is given follow-up with orthopedic surgery to discuss management regarding arthritis.   I personally performed the services described in this documentation, which was scribed in my presence. The recorded information has been reviewed and is accurate.   Final Clinical Impressions(s) / ED Diagnoses   Final diagnoses:  Primary osteoarthritis of right knee    New Prescriptions New Prescriptions   HYDROCODONE-ACETAMINOPHEN (NORCO/VICODIN) 5-325 MG TABLET    Take 1-2 tablets by mouth every 6 (six) hours as needed.   PREDNISONE (STERAPRED UNI-PAK 21 TAB) 10 MG (21) TBPK TABLET    Take 1 tablet (10 mg total) by mouth daily. Take 6 tabs by mouth daily  for 2 days, then 5 tabs for 2 days, then 4 tabs for 2 days, then 3 tabs for 2 days, 2 tabs for 2 days, then 1 tab by mouth daily for 2 days     Lavera Guise, MD 07/21/16 [phone]

## 2016-07-21 MED ORDER — HYDROCODONE-ACETAMINOPHEN 5-325 MG PO TABS: 1.0000 | ORAL_TABLET | Freq: Four times a day (QID) | ORAL | 0 refills | Status: DC | PRN

## 2016-07-21 MED ORDER — PREDNISONE 10 MG (21) PO TBPK: 10.0000 mg | ORAL_TABLET | Freq: Every day | ORAL | 0 refills | Status: DC

## 2016-07-21 MED ORDER — HYDROMORPHONE HCL 1 MG/ML IJ SOLN
1.0000 mg | Freq: Once | INTRAMUSCULAR | Status: AC
  Administered 2016-07-21: 1 mg via INTRAMUSCULAR
  Filled 2016-07-21: qty 1

## 2016-07-21 NOTE — ED Notes (Signed)
MD at bedside. 

## 2016-07-21 NOTE — Discharge Instructions (Signed)
Your x-ray show severe arthritis of the knee. You are given follow-up with orthopedic surgery above to discuss further management, including knee injections and potential knee replacement down the road if needed.  Please follow-up with your primary care doctor on Monday for further pain control.   Return for worsening symptoms, including fever, worsening redness/warmth/swelling of the knee, or any other symptoms concerning to you, escalating pain or any other symptoms concerning to you.

## 2016-07-21 NOTE — ED Notes (Signed)
Dr Joni Fears notified of pt's pain level, no additional orders given,

## 2016-09-12 NOTE — Progress Notes (Signed)
This encounter was created in error - please disregard.

## 2016-11-10 ENCOUNTER — Encounter (HOSPITAL_COMMUNITY): Payer: Self-pay

## 2016-11-10 ENCOUNTER — Ambulatory Visit (HOSPITAL_COMMUNITY)
Admission: EM | Admit: 2016-11-10 | Discharge: 2016-11-10 | Disposition: A | Discharge status: Home / Self Care | Payer: Self-pay | Attending: Internal Medicine | Admitting: Internal Medicine

## 2016-11-10 DIAGNOSIS — L03115 Cellulitis of right lower limb: Secondary | ICD-10-CM

## 2016-11-10 DIAGNOSIS — L97919 Non-pressure chronic ulcer of unspecified part of right lower leg with unspecified severity: Principal | ICD-10-CM

## 2016-11-10 DIAGNOSIS — I83019 Varicose veins of right lower extremity with ulcer of unspecified site: Secondary | ICD-10-CM

## 2016-11-10 MED ORDER — HYDROCODONE-ACETAMINOPHEN 5-325 MG PO TABS: 1.0000 | ORAL_TABLET | Freq: Four times a day (QID) | ORAL | 0 refills | Status: DC | PRN

## 2016-11-10 MED ORDER — DOXYCYCLINE HYCLATE 100 MG PO CAPS
100.0000 mg | ORAL_CAPSULE | Freq: Two times a day (BID) | ORAL | 0 refills | Status: DC
Start: 2016-11-10 — End: 2016-11-15

## 2016-11-10 MED ORDER — CLINDAMYCIN HCL 300 MG PO CAPS: 300.0000 mg | ORAL_CAPSULE | Freq: Two times a day (BID) | ORAL | 0 refills | Status: DC

## 2016-11-10 NOTE — ED Provider Notes (Signed)
[facility]    CSN: [id number] Arrival date & time: 11/10/16  1736     History   Chief Complaint Chief Complaint  Patient presents with  . Leg Swelling    HPI [name] is a 60 y.o. female. She presents today with a chronic ulcer on her right shin since August. This was small, appeared to her to be healing up, until she bumped it about a week ago, tripped over a pile of firewood at [employer]. Since then the wound has become markedly larger and painful, with a zone of redness around it. There is copious seepage from the wound, sticking to her pants. No malaise. She appears to have chronic leg edema and a history of rheumatoid arthritis. She is not currently taking any medications, does not have a PCP. Moved in with her son a month ago. Her son reports that this is the first time she has been willing to seek care for the leg ulcer.    HPI  Past Medical History:  Diagnosis Date  . Hypercholesteremia   . Hypothyroid   . Rheumatoid arthritis(714.0)     Patient Active Problem List   Diagnosis Date Noted  . Hypokalemia 04/23/2015  . Diarrhea 04/23/2015  . Acute kidney injury (HCC) 04/23/2015  . LIBIDO, DECREASED 12/21/2008  . FATIGUE 09/06/2008  . LEG EDEMA 07/21/2008  . VITAMIN B12 DEFICIENCY 07/08/2008  . Rheumatoid arthritis (HCC) 07/08/2008  . OBESITY 05/04/2008  . HYPERTENSION 05/04/2008  . ALLERGIC RHINITIS 05/04/2008  . GERD 05/04/2008  . ELBOW PAIN, LEFT 05/04/2008  . URINARY INCONTINENCE 05/04/2008  . VARICOSE VEINS LOWER EXTREMITIES W/INFLAMMATION 11/02/2007  . Hypothyroidism 08/18/2007    Past Surgical History:  Procedure Laterality Date  . CHOLECYSTECTOMY    . ELBOW SURGERY    . EXPLORATORY LAPAROTOMY    . HAND SURGERY    . HEEL SPUR SURGERY    . partial thyroidectomy  2015  . TONSILLECTOMY    . TUBAL LIGATION       Home Medications    Prior to Admission medications   Medication Sig Start Date End Date Taking? Authorizing  Provider  aspirin EC 81 MG tablet Take 81 mg by mouth daily.    Historical Provider, MD  clindamycin (CLEOCIN) 300 MG capsule Take 1 capsule (300 mg total) by mouth 2 (two) times daily. 11/10/16 11/20/16  Eustace Moore, MD  doxycycline (VIBRAMYCIN) 100 MG capsule Take 1 capsule (100 mg total) by mouth 2 (two) times daily. 11/10/16   Eustace Moore, MD  hydrochlorothiazide (HYDRODIURIL) 25 MG tablet Take 25 mg by mouth daily.  05/21/16   Historical Provider, MD  HYDROcodone-acetaminophen (NORCO/VICODIN) 5-325 MG tablet Take 1 tablet by mouth 4 (four) times daily as needed. 11/10/16   Eustace Moore, MD  levothyroxine (SYNTHROID, LEVOTHROID) 150 MCG tablet Take 150 mcg by mouth daily before breakfast.    Historical Provider, MD  naproxen sodium (ALEVE) 220 MG tablet Take 880 mg by mouth daily as needed (for pain).    Historical Provider, MD  pantoprazole (PROTONIX) 40 MG tablet Take 40 mg by mouth 2 (two) times daily.  04/17/16   Historical Provider, MD  predniSONE (STERAPRED UNI-PAK 21 TAB) 10 MG (21) TBPK tablet Take 1 tablet (10 mg total) by mouth daily. Take 6 tabs by mouth daily  for 2 days, then 5 tabs for 2 days, then 4 tabs for 2 days, then 3 tabs for 2 days, 2 tabs for 2 days, then 1  tab by mouth daily for 2 days 07/21/16   Lavera Guise, MD  simvastatin (ZOCOR) 20 MG tablet Take 20 mg by mouth at bedtime.     Historical Provider, MD    Family History No family history on file.  Social History Social History  Substance Use Topics  . Smoking status: Former Smoker    Packs/day: 1.00    Years: 40.00    Types: Cigarettes    Quit date: 02/23/2014  . Smokeless tobacco: Never Used  . Alcohol use No     Allergies   Latex; Oxycodone-acetaminophen; Adhesive [tape]; Penicillins; and Strawberry extract   Review of Systems Review of Systems  All other systems reviewed and are negative.    Physical Exam Triage Vital Signs ED Triage Vitals  Enc Vitals Group     BP 11/10/16 1824 178/92      Pulse Rate 11/10/16 1824 69     Resp --      Temp 11/10/16 1824 98.3 F (36.8 C)     Temp Source 11/10/16 1824 Oral     SpO2 11/10/16 1824 96 %     Weight --      Height --      Pain Score 11/10/16 1840 9   Updated Vital Signs BP 178/92 (BP Location: Right Arm)   Pulse 69   Temp 98.3 F (36.8 C) (Oral)   SpO2 96%  Physical Exam  Constitutional: She is oriented to person, place, and time. No distress.  Alert, nicely groomed  HENT:  Head: Atraumatic.  Eyes:  Conjugate gaze, no eye redness/drainage  Neck: Neck supple.  Cardiovascular: Normal rate.   Pulmonary/Chest: No respiratory distress.  Abdominal: She exhibits no distension.  Musculoskeletal: Normal range of motion.  No leg swelling  Neurological: She is alert and oriented to person, place, and time.  Skin: Skin is warm and dry.  No cyanosis Bilateral venous stasis changes, with hyperpigmentation and puffy edema to both lower extremities. The right distal lower extremity is markedly erythematous, very tender to palpation diffusely. Lower third of the shin there is a 4 inch ulcer, beefy base and necrotic edges. There is no odor.  Nursing note and vitals reviewed.    UC Treatments / Results   Procedures Procedures (including critical care time) None today   Final Clinical Impressions(s) / UC Diagnoses   Final diagnoses:  Venous ulcer of right leg (HCC)  Cellulitis of right lower leg   Leg ulcer will take some time to heal.  Referral was made to the wound center for compression dressings.  Prescriptions for clindamycin and doxycycline (antibiotics to cover MRSA and strep) were sent to the [facility] on [address].  Prescription for a small number of vicodin was printed.  Anticipate gradual improvement in pain over the next several days with healing of the leg ulcer over the next weeks to months.  Recheck for increasing redness/pain/swelling/drainage or new fever >100.5.  New Prescriptions Discharge Medication List as of  11/10/2016  7:12 PM    START taking these medications   Details  clindamycin (CLEOCIN) 300 MG capsule Take 1 capsule (300 mg total) by mouth 2 (two) times daily., Starting Sun 11/10/2016, Until Wed 11/20/2016, Normal    doxycycline (VIBRAMYCIN) 100 MG capsule Take 1 capsule (100 mg total) by mouth 2 (two) times daily., Starting Sun 11/10/2016, Normal         Eustace Moore, MD 11/17/16 2101

## 2016-11-10 NOTE — ED Triage Notes (Signed)
Pt said she had some leg swelling back in July 2017 and haven't taken care of it since. Has bumped it 1 week ago and it was bleeding and now it is an open wound the size of my fist. Green drainage and pt said it smells. Has a bandaid covering it which is making it worse. Used gold bond liquid on it and taking aleve.

## 2016-11-10 NOTE — Discharge Instructions (Addendum)
Leg ulcer will take some time to heal.  Referral was made to the wound center for compression dressings.  Prescriptions for clindamycin and doxycycline (antibiotics to cover MRSA and strep) were sent to the [facility] on [address].  Prescription for a small number of vicodin was printed.  Anticipate gradual improvement in pain over the next several days with healing of the leg ulcer over the next weeks to months.  Recheck for increasing redness/pain/swelling/drainage or new fever >100.5.

## 2016-11-14 ENCOUNTER — Observation Stay (HOSPITAL_COMMUNITY)
Admission: EM | Admit: 2016-11-14 | Discharge: 2016-11-15 | Disposition: A | Discharge status: Home / Self Care | Payer: Self-pay | Attending: Internal Medicine | Admitting: Internal Medicine

## 2016-11-14 ENCOUNTER — Ambulatory Visit (HOSPITAL_COMMUNITY)
Admission: EM | Admit: 2016-11-14 | Discharge: 2016-11-14 | Disposition: A | Discharge status: Home / Self Care | Payer: Self-pay | Attending: Family Medicine | Admitting: Family Medicine

## 2016-11-14 ENCOUNTER — Emergency Department (HOSPITAL_COMMUNITY): Payer: Self-pay

## 2016-11-14 ENCOUNTER — Encounter (HOSPITAL_COMMUNITY): Payer: Self-pay | Admitting: Emergency Medicine

## 2016-11-14 DIAGNOSIS — Z91048 Other nonmedicinal substance allergy status: Secondary | ICD-10-CM

## 2016-11-14 DIAGNOSIS — M79661 Pain in right lower leg: Secondary | ICD-10-CM

## 2016-11-14 DIAGNOSIS — L97919 Non-pressure chronic ulcer of unspecified part of right lower leg with unspecified severity: Principal | ICD-10-CM | POA: Insufficient documentation

## 2016-11-14 DIAGNOSIS — E039 Hypothyroidism, unspecified: Secondary | ICD-10-CM | POA: Diagnosis present

## 2016-11-14 DIAGNOSIS — M069 Rheumatoid arthritis, unspecified: Secondary | ICD-10-CM | POA: Diagnosis present

## 2016-11-14 DIAGNOSIS — L03115 Cellulitis of right lower limb: Secondary | ICD-10-CM

## 2016-11-14 DIAGNOSIS — L97811 Non-pressure chronic ulcer of other part of right lower leg limited to breakdown of skin: Secondary | ICD-10-CM

## 2016-11-14 DIAGNOSIS — F17210 Nicotine dependence, cigarettes, uncomplicated: Secondary | ICD-10-CM | POA: Insufficient documentation

## 2016-11-14 DIAGNOSIS — Z9119 Patient's noncompliance with other medical treatment and regimen: Secondary | ICD-10-CM | POA: Insufficient documentation

## 2016-11-14 DIAGNOSIS — Z885 Allergy status to narcotic agent status: Secondary | ICD-10-CM

## 2016-11-14 DIAGNOSIS — Z6841 Body Mass Index (BMI) 40.0 and over, adult: Secondary | ICD-10-CM | POA: Insufficient documentation

## 2016-11-14 DIAGNOSIS — L97909 Non-pressure chronic ulcer of unspecified part of unspecified lower leg with unspecified severity: Secondary | ICD-10-CM | POA: Diagnosis present

## 2016-11-14 DIAGNOSIS — IMO0002 Reserved for concepts with insufficient information to code with codable children: Secondary | ICD-10-CM

## 2016-11-14 DIAGNOSIS — E538 Deficiency of other specified B group vitamins: Secondary | ICD-10-CM | POA: Diagnosis present

## 2016-11-14 DIAGNOSIS — Z831 Family history of other infectious and parasitic diseases: Secondary | ICD-10-CM

## 2016-11-14 DIAGNOSIS — G8929 Other chronic pain: Secondary | ICD-10-CM | POA: Insufficient documentation

## 2016-11-14 DIAGNOSIS — Z9114 Patient's other noncompliance with medication regimen: Secondary | ICD-10-CM

## 2016-11-14 DIAGNOSIS — Z880 Allergy status to penicillin: Secondary | ICD-10-CM | POA: Insufficient documentation

## 2016-11-14 DIAGNOSIS — E669 Obesity, unspecified: Secondary | ICD-10-CM | POA: Insufficient documentation

## 2016-11-14 DIAGNOSIS — Z833 Family history of diabetes mellitus: Secondary | ICD-10-CM | POA: Insufficient documentation

## 2016-11-14 DIAGNOSIS — E7800 Pure hypercholesterolemia, unspecified: Secondary | ICD-10-CM | POA: Insufficient documentation

## 2016-11-14 DIAGNOSIS — L98499 Non-pressure chronic ulcer of skin of other sites with unspecified severity: Secondary | ICD-10-CM

## 2016-11-14 DIAGNOSIS — E038 Other specified hypothyroidism: Secondary | ICD-10-CM

## 2016-11-14 DIAGNOSIS — Z91040 Latex allergy status: Secondary | ICD-10-CM

## 2016-11-14 DIAGNOSIS — Z801 Family history of malignant neoplasm of trachea, bronchus and lung: Secondary | ICD-10-CM

## 2016-11-14 DIAGNOSIS — E063 Autoimmune thyroiditis: Secondary | ICD-10-CM | POA: Insufficient documentation

## 2016-11-14 DIAGNOSIS — B359 Dermatophytosis, unspecified: Secondary | ICD-10-CM | POA: Insufficient documentation

## 2016-11-14 DIAGNOSIS — E785 Hyperlipidemia, unspecified: Secondary | ICD-10-CM

## 2016-11-14 DIAGNOSIS — B351 Tinea unguium: Secondary | ICD-10-CM

## 2016-11-14 DIAGNOSIS — I10 Essential (primary) hypertension: Secondary | ICD-10-CM | POA: Diagnosis present

## 2016-11-14 DIAGNOSIS — Z91018 Allergy to other foods: Secondary | ICD-10-CM

## 2016-11-14 HISTORY — DX: Essential (primary) hypertension: I10

## 2016-11-14 HISTORY — DX: Autoimmune thyroiditis: E06.3

## 2016-11-14 LAB — COMPREHENSIVE METABOLIC PANEL
ALBUMIN: 3.7 g/dL (ref 3.5–5.0)
ALT: 9 U/L — AB (ref 14–54)
AST: 16 U/L (ref 15–41)
Alkaline Phosphatase: 83 U/L (ref 38–126)
Anion gap: 12 (ref 5–15)
BUN: 13 mg/dL (ref 6–20)
CHLORIDE: 96 mmol/L — AB (ref 101–111)
CO2: 26 mmol/L (ref 22–32)
Calcium: 9.9 mg/dL (ref 8.9–10.3)
Creatinine, Ser: 1.08 mg/dL — ABNORMAL HIGH (ref 0.44–1.00)
GFR calc Af Amer: >60 mL/min (ref >60)
GFR calc non Af Amer: 55 mL/min — ABNORMAL LOW (ref >60)
Glucose, Bld: 112 mg/dL — ABNORMAL HIGH (ref 65–99)
Potassium: 3.9 mmol/L (ref 3.5–5.1)
SODIUM: 134 mmol/L — AB (ref 135–145)
Total Bilirubin: 0.8 mg/dL (ref 0.3–1.2)
Total Protein: 7.3 g/dL (ref 6.5–8.1)

## 2016-11-14 LAB — CBC WITH DIFFERENTIAL/PLATELET
BASOS ABS: 0 10*3/uL (ref 0.0–0.1)
Basophils Relative: 0 %
EOS ABS: 0.1 10*3/uL (ref 0.0–0.7)
EOS PCT: 1 %
HCT: 48.1 % — ABNORMAL HIGH (ref 36.0–46.0)
Hemoglobin: 16.2 g/dL — ABNORMAL HIGH (ref 12.0–15.0)
LYMPHS PCT: 13 %
Lymphs Abs: 1.1 10*3/uL (ref 0.7–4.0)
MCH: 31.5 pg (ref 26.0–34.0)
MCHC: 33.7 g/dL (ref 30.0–36.0)
MCV: 93.6 fL (ref 78.0–100.0)
Monocytes Absolute: 0.7 10*3/uL (ref 0.1–1.0)
Monocytes Relative: 9 %
Neutro Abs: 6.4 10*3/uL (ref 1.7–7.7)
Neutrophils Relative %: 77 %
PLATELETS: 222 10*3/uL (ref 150–400)
RBC: 5.14 MIL/uL — AB (ref 3.87–5.11)
RDW: 15.2 % (ref 11.5–15.5)
WBC: 8.3 10*3/uL (ref 4.0–10.5)

## 2016-11-14 LAB — I-STAT CG4 LACTIC ACID, ED: Lactic Acid, Venous: 1.81 mmol/L (ref 0.5–1.9)

## 2016-11-14 MED ORDER — HYDROMORPHONE HCL 2 MG/ML IJ SOLN
0.5000 mg | Freq: Once | INTRAMUSCULAR | Status: AC
  Administered 2016-11-14: 0.5 mg via INTRAVENOUS
  Filled 2016-11-14: qty 1

## 2016-11-14 MED ORDER — ONDANSETRON HCL 4 MG/2ML IJ SOLN
4.0000 mg | Freq: Once | INTRAMUSCULAR | Status: AC
  Administered 2016-11-14: 4 mg via INTRAVENOUS
  Filled 2016-11-14: qty 2

## 2016-11-14 MED ORDER — TRAMADOL HCL 50 MG PO TABS
50.0000 mg | ORAL_TABLET | Freq: Four times a day (QID) | ORAL | Status: DC | PRN
  Administered 2016-11-15 (×2): 50 mg via ORAL
  Filled 2016-11-14 (×2): qty 1

## 2016-11-14 MED ORDER — SODIUM CHLORIDE 0.9 % IV SOLN
Freq: Once | INTRAVENOUS | Status: AC
  Administered 2016-11-14: 21:00:00 via INTRAVENOUS

## 2016-11-14 MED ORDER — TRAMADOL HCL 50 MG PO TABS
50.0000 mg | ORAL_TABLET | Freq: Once | ORAL | Status: AC
  Administered 2016-11-14: 50 mg via ORAL
  Filled 2016-11-14: qty 1

## 2016-11-14 MED ORDER — SODIUM CHLORIDE 0.9 % IV SOLN
INTRAVENOUS | Status: AC
  Administered 2016-11-15: via INTRAVENOUS

## 2016-11-14 MED ORDER — VANCOMYCIN HCL IN DEXTROSE 1-5 GM/200ML-% IV SOLN
1000.0000 mg | Freq: Once | INTRAVENOUS | Status: AC
  Administered 2016-11-14: 1000 mg via INTRAVENOUS
  Filled 2016-11-14: qty 200

## 2016-11-14 NOTE — ED Provider Notes (Signed)
CSN: [id number]     Arrival date & time 11/14/16  1851 History   None    Chief Complaint  Patient presents with  . Follow-up   (Consider location/radiation/quality/duration/timing/severity/associated sxs/prior Treatment) HPI   Ms. parent is a 60 year old female with past medical history of hypercholesterolemia, rheumatoid arthritis, and hypothyroidism presenting to the ED for follow-up on her leg ulceration. He is accompanied by her son who is been assisting with her wound dressings.  She was seen in urgent care on 11/10/16 due to this ulceration of the R LE. At that time she was prescribed clindamycin and doxycycline. She was also referred to [facility] and hyperbaric medicine. She has an appt in [place] on Monday with Wound Care but was concerned as her symptoms continued to worsen. She has been taking the doxycycline and clindamycin as prescribed.   Her son notes that the beefy redness has worsened. He notes that the area of yellow tissue and drainage has also increased.  Her pain is worsening despite taking Norco.  She started having nausea and emesis a few hours ago. She also notes chills but no fevers. She denies a h/o DM or vascular disease but she has not been evaluated for these things in a long time.   Past Medical History:  Diagnosis Date  . Hypercholesteremia   . Hypothyroid   . Rheumatoid arthritis(714.0)    Past Surgical History:  Procedure Laterality Date  . CHOLECYSTECTOMY    . ELBOW SURGERY    . EXPLORATORY LAPAROTOMY    . HAND SURGERY    . HEEL SPUR SURGERY    . partial thyroidectomy  2015  . TONSILLECTOMY    . TUBAL LIGATION     History reviewed. No pertinent family history. Social History  Substance Use Topics  . Smoking status: Former Smoker    Packs/day: 1.00    Years: 40.00    Types: Cigarettes    Quit date: 02/23/2014  . Smokeless tobacco: Never Used  . Alcohol use No   OB History    No data available     Review of Systems    Constitutional: Positive for chills and diaphoresis. Negative for activity change, fatigue and fever.  HENT: Negative.   Eyes: Negative.   Respiratory: Negative.   Cardiovascular: Negative.   Gastrointestinal: Positive for nausea. Negative for abdominal pain and vomiting.  Endocrine: Negative.   Genitourinary: Negative.   Musculoskeletal: Positive for myalgias.  Skin: Positive for color change and wound.  Allergic/Immunologic: Negative.   Neurological: Negative.   Hematological: Negative.   Psychiatric/Behavioral: Negative.     Allergies  Latex; Oxycodone-acetaminophen; Adhesive [tape]; Penicillins; and Strawberry extract  Home Medications   Prior to Admission medications   Medication Sig Start Date End Date Taking? Authorizing Provider  aspirin EC 81 MG tablet Take 81 mg by mouth daily.   Yes Historical Provider, MD  doxycycline (VIBRAMYCIN) 100 MG capsule Take 1 capsule (100 mg total) by mouth 2 (two) times daily. 11/10/16  Yes Eustace Moore, MD  naproxen sodium (ALEVE) 220 MG tablet Take 880 mg by mouth daily as needed (for pain).   Yes Historical Provider, MD  clindamycin (CLEOCIN) 300 MG capsule Take 1 capsule (300 mg total) by mouth 2 (two) times daily. 11/10/16 11/20/16  Eustace Moore, MD  hydrochlorothiazide (HYDRODIURIL) 25 MG tablet Take 25 mg by mouth daily.  05/21/16   Historical Provider, MD  HYDROcodone-acetaminophen (NORCO/VICODIN) 5-325 MG tablet Take 1 tablet by mouth 4 (four)  times daily as needed. 11/10/16   Eustace Moore, MD  levothyroxine (SYNTHROID, LEVOTHROID) 150 MCG tablet Take 150 mcg by mouth daily before breakfast.    Historical Provider, MD  pantoprazole (PROTONIX) 40 MG tablet Take 40 mg by mouth 2 (two) times daily.  04/17/16   Historical Provider, MD  predniSONE (STERAPRED UNI-PAK 21 TAB) 10 MG (21) TBPK tablet Take 1 tablet (10 mg total) by mouth daily. Take 6 tabs by mouth daily  for 2 days, then 5 tabs for 2 days, then 4 tabs for 2 days, then 3 tabs  for 2 days, 2 tabs for 2 days, then 1 tab by mouth daily for 2 days 07/21/16   Lavera Guise, MD  simvastatin (ZOCOR) 20 MG tablet Take 20 mg by mouth at bedtime.     Historical Provider, MD   Meds Ordered and Administered this Visit  Medications - No data to display  BP 144/94 (BP Location: Right Wrist)   Pulse 71   Temp 98.1 F (36.7 C) (Oral)   Resp 16   SpO2 96%  No data found.   Physical Exam  Constitutional: She is oriented to person, place, and time. She appears well-developed.  Pale. Acutely ill but non-toxic.   Eyes: Conjunctivae are normal.  Neck:  Surgical scar over the thyroid  Cardiovascular: Normal rate, regular rhythm and normal heart sounds.  Exam reveals no gallop and no friction rub.   No murmur heard. Pulmonary/Chest: Effort normal and breath sounds normal. No respiratory distress. She has no wheezes. She has no rales.  Musculoskeletal:  Trace pitting edema bilaterally  Lymphadenopathy:    She has no cervical adenopathy.  Neurological: She is alert and oriented to person, place, and time. She exhibits normal muscle tone.  Skin: Skin is warm. Capillary refill takes less than 2 seconds. Rash noted.  Right lower extremity: circumferential erythema/warmth over the lower distal half of the lower extremity. Full thickness ulceration measuring 4.5 cm x 6.5cm with a beefy red base, necrotic edges and drainage. exquisitely tender to palpation.     Urgent Care Course   Clinical Course     Procedures (including critical care time)  Labs Review Labs Reviewed - No data to display  Imaging Review No results found.    MDM   1. Ulceration (HCC)   2. Cellulitis of right lower extremity    This is a 60 y/o presenting with a RLE ulceration with surrounding cellulitis that has been worsening despite both doxycyline and clindamycin. She developed nausea and chills today. Her ulceration was wrapped with Telfa.  Patient stable and was transported down to the ED via  shuttle for IV antibiotics given failure of outpatient management.    Joanna Puff, MD 11/14/16 2010

## 2016-11-14 NOTE — ED Triage Notes (Signed)
Patient reports chronic non healing wound at right lower leg for several months worsened this past several weeks with drainage , currently taking oral Doxycycline and Clindamycin with no improvement , seen at urgent care last Sunday referred to wound clinic . Denies fever or chills .

## 2016-11-14 NOTE — H&P (Signed)
Date: 11/14/2016               Patient Name:  [name] MRN: [id number]  DOB: [birth year] Age / Sex: 60 y.o., female   PCP: No Pcp Per Patient         Medical Service: Internal Medicine Teaching Service         Attending Physician: Dr. Aldine Contes, MD    First Contact: Dr. Philipp Ovens Pager: [phone]  Second Contact: Dr. Charlynn Grimes Pager: [phone]       After Hours (After 5p/  First Contact Pager: [phone]  weekends / holidays): Second Contact Pager: [phone]   Chief Complaint: rapidly worsening right lower extremity ulcer.  History of Present Illness:  This is a 60 y/o F with untreated RhA, untreated Hashimoto's Disease and obesity who presents for evaluation of progressively worsening right lower extremity ulceration. Patient noticed a small bump in August which progressively developed into an expanding ulcer, with acute exacerbation over the past day. She was seen in [facility]/24 and was started on Doxycycline and Clindamycin for suspected cellulitis. Her wound did not appear to improve since that time and actually had a significant increase in the size of the ulcer, pain and also foul-smelling drainage. The patient reports she hasn't followed up with a PCP in "a while" and has been non-compliant with all of her medications including Synthroid or any RhA treatment. Reports taking upwards of 8-9 Aleive a day for her arthritis pain. She endorses feeling febrile and very cold however denies any other symptoms including night sweats, chest pain, SOB, abdominal pain, diarrhea or HA.  In [facility] VSS (Temp 97.8*, pulse 84, BP 125/88, respirations 18 and was saturating 100% on RA). RLE X-ray did not demonstrate any osseous erosions however did show mild degenerative changes of knee and diffuse soft tissue swelling. CMET and CBC wnl and was without leukocytosis. Blood cultures were obtained x2 and she was given IV Vancomycin and subsequently admitted for failure of outpatient  treatment.   Meds:  Current Meds  Medication Sig  . clindamycin (CLEOCIN) 300 MG capsule Take 1 capsule (300 mg total) by mouth 2 (two) times daily.  Marland Kitchen doxycycline (VIBRAMYCIN) 100 MG capsule Take 1 capsule (100 mg total) by mouth 2 (two) times daily.  Marland Kitchen HYDROcodone-acetaminophen (NORCO/VICODIN) 5-325 MG tablet Take 1 tablet by mouth 4 (four) times daily as needed.   Allergies: Allergies as of 11/14/2016 - Review Complete 11/14/2016  Allergen Reaction Noted  . Latex Hives and Itching 04/15/2013  . Oxycodone-acetaminophen Nausea And Vomiting 08/18/2007  . Adhesive [tape] Rash 04/23/2015  . Penicillins Swelling and Rash 08/18/2007  . Strawberry extract Itching and Rash 04/23/2015   Past Medical History:  Diagnosis Date  . Hypercholesteremia   . Hypothyroid   . Rheumatoid arthritis(714.0)    Family History: Mother had history of DM, ultimately deceased due to complications from Botulism per patient. Father deceased due to lung cancer in late 65's.  Social History: Current smoker with an 80 pack-year history. Denied any alcohol or recreational drug use. Recently moved in with her son Aaron Edelman who appears supportive. Works at [employer].  Review of Systems: A complete ROS was negative except as per HPI.  Physical Exam: Blood pressure 138/90, pulse 71, temperature 97.8 F (36.6 C), temperature source Oral, resp. rate 23, height 5' 3"  (1.6 m), weight 260 lb (117.9 kg), SpO2 98 %. General: Obese caucasian female, resting comfortably in bed. In no acute distress unless RLE wound  is manipulated, then in considerable distress. Pleasant and joking often. Son present at bedside.  HENT: EOMI. No conjunctival injection, icterus or ptosis. Oropharynx clear, mucous membranes moist. Mild angular cheilits present BL. Cardiovascular: Regular rate and rhythm. No murmur or rub appreciated. Pulmonary: CTA BL, no wheezing, crackles or rhonchi appreciated. Unlabored breathing.  Abdomen: Soft, non-tender and  non-distended. No guarding or rigidity. +bowel sounds. Diastasis recti present.  Extremities: Trace edema in BL LE. +Radial pulses however no palpable dorsalis pedis or posterior tibialis pulses. No gross deformities. Onychomycosis of toenails. Boutonierre deformities present BL hands. Varicose veins present BL LE.  Skin: Cold, dry. No cyanosis. Extremely painful 6x5cm ulceration present on right shin with surrounding violaceous non-distinct boarder. Not warm to touch. Sangenous and purulent drainage present however no foul odor. Also present is a scaling cracked lesion on her left thumb crease.  Neuro: Strength and sensation grossly intact.  Psych: Mood normal and affect was mood congruent. Responds to questions appropriately.     R LE X-ray: Negative for acute osseous abnormality however did demonstrate diffuse soft tissue swelling and mild degenerative changes of the knee.   Assessment & Plan by Problem: This is a 60 y/o F with untreated RhA and Hashimoto's disease who presents with a progressive right LE wound. Began as a small bump present since August that's progressed moderately until last week during which it progressed rapidly. Started on Doxy + Clinda 11/24 without improvement and has had significant worsening in size, pain and purulent drainage.  Principal Problem:   Lower extremity ulceration (HCC) Active Problems:   Hypothyroidism   Vitamin B12 deficiency   Essential hypertension   Rheumatoid arthritis ([place])   Hyperlipidemia   Venous stasis ulcer ([place])  R LE Ulceration Unclear etiology - likely multifactorial. ?Venous insufficiency ulcer vs ?Pyoderma Gangrenosum. Pt has multiple RF's for all (VI: tobacco, obesity, HLD, HTN) (PG: uncontrolled RhA, Hashimoto's). No leukocytosis and no foul odor on examination. Surrounding skin erythematous however not hot to touch. Appears to be some changes c/w venous insufficiency however does not appear to explain entire picture.  -Received IV  Vancomycin in ED.  -ABI's -HIV -HepC -HbA1c -Wound care consult -CBC w/ diff -BMET -ESR, CRP -Dilaudid and tramadol for pain -Topical steroid cream (betamethasone) out of concern for PG, consider systemic if infection felt to be less likely.   Hashimotos Dx S/p partial thyroid resection. Has been noncompliant with Synthroid 150 mcg x several months. Endorses feeling fatigued and cold all the time.  -TSH = 99 -Started 150 mcg Synthroid daily  Rheumatoid Arthritis Long hx of RhA with classic physical features on PE. Was on Enbrel however lost insurance and has been unable to afford them in recent years. Reports recent exacerbation of RhA and taking upwards of 8-9 Alieve daily.  -Consider systemic therapy  Tinea, onychomycosis 6 month history of itchy, scaly and cracking rash in the 1st/2nd interdigital space.  -Topical terbinafine to affected hand BID.  -Consider oral therapy in outpt setting.  HTN Chart review shows she was on HCTZ 25 mg in the past however has not taken in "awhile". Normotensive currently. Will continue to monitor however could consider restarting if indicated.   HLD Pt reports she was on Simvastatin 20 mg however has been non-compliant.  -Lipid panel  Vitamin B12 Deficiency  Angular cheilitis noted on PE. Could be fungal vs vitamin def. Has hx of b12 deficiency -Vitamin B12 level, supplement as needed. -Consider topical antifungals or Zinc Oxide if B12 normal  No Primary Care Physician Patient will need to establish with PCP for management of her chronic medical issues. Agreeable to being seen in IM clinic.  -Schedule f/u to establish at [facility]  Abx: 1 dose Vancomycin in ED Diet: [place] IVF: ns @ 58m/hr DVT Prophylaxis: Lovenox Code status: FULL CODE  Dispo: Admit patient to Observation with expected length of stay less than 2 midnights.  Signed:Einar Gip DO 11/14/2016, 10:47 PM  Pager: [phone]

## 2016-11-14 NOTE — Discharge Instructions (Signed)
Go to the emergency room. You will need IV antibiotics given that the oral antibiotics have not helped.

## 2016-11-14 NOTE — ED Notes (Signed)
[name]: Son's phone number- [phone]

## 2016-11-14 NOTE — ED Notes (Signed)
Pt declined shuttle service... Son would take her down by [facility].... Adv pt to go directly and NPO... Pt verb understanding.

## 2016-11-14 NOTE — ED Notes (Signed)
Patient transported to X-ray 

## 2016-11-14 NOTE — ED Triage Notes (Signed)
Pt is here for a f/u for cellulitis of RLE  Taking antibiotics but this morning has been feeling nauseas and vomiting   Reports sx are getting worse.... Edema, redness and purulent dishcarge has increased  Son in exam room w/pt... Slow gait... A&O x4... NAD

## 2016-11-14 NOTE — ED Provider Notes (Signed)
[facility] Provider Note   CSN: [id number] Arrival date & time: 11/14/16  1958     History   Chief Complaint Chief Complaint  Patient presents with  . Wound Infection    HPI [name] is a 60 y.o. female.  This is a 60 year old female with recent history of cellulitis and wound to the anterior portion of right lower leg she's been on a course of antibiotics with no relief or healing was seen again today at urgent care who felt that the wound is worse she has been referred to wound care clinic but has been unable to get an appointment until next week today she reports that she's had chills nausea and vomiting.      Past Medical History:  Diagnosis Date  . Hashimoto's thyroiditis   . HTN (hypertension)   . Hypercholesteremia   . Hypothyroid   . Rheumatoid arthritis(714.0)     Patient Active Problem List   Diagnosis Date Noted  . Venous stasis ulcer (HCC) 11/15/2016  . Lower extremity ulceration (HCC) 11/14/2016  . Hyperlipidemia 11/14/2016  . Hypokalemia 04/23/2015  . Diarrhea 04/23/2015  . Acute kidney injury (HCC) 04/23/2015  . LIBIDO, DECREASED 12/21/2008  . FATIGUE 09/06/2008  . LEG EDEMA 07/21/2008  . Vitamin B12 deficiency 07/08/2008  . Rheumatoid arthritis (HCC) 07/08/2008  . OBESITY 05/04/2008  . Essential hypertension 05/04/2008  . ALLERGIC RHINITIS 05/04/2008  . GERD 05/04/2008  . ELBOW PAIN, LEFT 05/04/2008  . URINARY INCONTINENCE 05/04/2008  . VARICOSE VEINS LOWER EXTREMITIES W/INFLAMMATION 11/02/2007  . Hypothyroidism 08/18/2007    Past Surgical History:  Procedure Laterality Date  . CHOLECYSTECTOMY    . ELBOW SURGERY    . EXPLORATORY LAPAROTOMY    . HAND SURGERY    . HEEL SPUR SURGERY    . partial thyroidectomy  2015  . TONSILLECTOMY    . TUBAL LIGATION      OB History    No data available       Home Medications    Prior to Admission medications   Medication Sig Start Date End Date Taking? Authorizing Provider    clindamycin (CLEOCIN) 300 MG capsule Take 1 capsule (300 mg total) by mouth 2 (two) times daily. 11/10/16 11/20/16 Yes Eustace Moore, MD  doxycycline (VIBRAMYCIN) 100 MG capsule Take 1 capsule (100 mg total) by mouth 2 (two) times daily. 11/10/16  Yes Eustace Moore, MD  HYDROcodone-acetaminophen (NORCO/VICODIN) 5-325 MG tablet Take 1 tablet by mouth 4 (four) times daily as needed. 11/10/16  Yes Eustace Moore, MD  predniSONE (STERAPRED UNI-PAK 21 TAB) 10 MG (21) TBPK tablet Take 1 tablet (10 mg total) by mouth daily. Take 6 tabs by mouth daily  for 2 days, then 5 tabs for 2 days, then 4 tabs for 2 days, then 3 tabs for 2 days, 2 tabs for 2 days, then 1 tab by mouth daily for 2 days Patient not taking: Reported on 11/14/2016 07/21/16   Lavera Guise, MD    Family History Family History  Problem Relation Age of Onset  . Diabetes Mother   . Lung cancer Father     Social History Social History  Substance Use Topics  . Smoking status: Current Every Day Smoker    Packs/day: 2.00    Years: 40.00    Types: Cigarettes    Last attempt to quit: 02/23/2014  . Smokeless tobacco: Never Used  . Alcohol use No     Allergies   Latex; Oxycodone-acetaminophen; Adhesive [  tape]; Penicillins; and Strawberry extract   Review of Systems Review of Systems  Constitutional: Positive for chills.  Respiratory: Negative for shortness of breath.   Cardiovascular: Positive for leg swelling.  Gastrointestinal: Positive for nausea and vomiting.  Skin: Positive for wound.  Neurological: Positive for weakness. Negative for headaches.  All other systems reviewed and are negative.    Physical Exam Updated Vital Signs BP 134/77 (BP Location: Right Arm)   Pulse (!) 56   Temp 98.8 F (37.1 C) (Oral)   Resp 18   Ht 5\' 3"  (1.6 m)   Wt 117.9 kg   SpO2 100%   BMI 46.06 kg/m   Physical Exam  Constitutional: She is oriented to person, place, and time.  Cardiovascular: Normal rate and regular rhythm.    Pulmonary/Chest: Effort normal and breath sounds normal.  Abdominal: Soft.  Neurological: She is oriented to person, place, and time.  Skin: Skin is warm. There is erythema.     Vitals reviewed.    ED Treatments / Results  Labs (all labs ordered are listed, but only abnormal results are displayed) Labs Reviewed  CBC WITH DIFFERENTIAL/PLATELET - Abnormal; Notable for the following:       Result Value   RBC 5.14 (*)    Hemoglobin 16.2 (*)    HCT 48.1 (*)    All other components within normal limits  COMPREHENSIVE METABOLIC PANEL - Abnormal; Notable for the following:    Sodium 134 (*)    Chloride 96 (*)    Glucose, Bld 112 (*)    Creatinine, Ser 1.08 (*)    ALT 9 (*)    GFR calc non Af Amer 55 (*)    All other components within normal limits  TSH - Abnormal; Notable for the following:    TSH 99.122 (*)    All other components within normal limits  SEDIMENTATION RATE - Abnormal; Notable for the following:    Sed Rate 29 (*)    All other components within normal limits  CULTURE, BLOOD (ROUTINE X 2)  CULTURE, BLOOD (ROUTINE X 2)  VITAMIN B12  C-REACTIVE PROTEIN  LIPID PANEL  HIV ANTIBODY (ROUTINE TESTING)  HEPATITIS C ANTIBODY  CBC WITH DIFFERENTIAL/PLATELET  BASIC METABOLIC PANEL  HEMOGLOBIN A1C  I-STAT CG4 LACTIC ACID, ED    EKG  EKG Interpretation None       Radiology Dg Tibia/fibula Right  Result Date: 11/14/2016 CLINICAL DATA:  Chronic pain, erythema and swelling at the right lower leg. Infected wound. Initial encounter. EXAM: RIGHT TIBIA AND FIBULA - 2 VIEW COMPARISON:  Right knee radiographs performed 07/20/2016 FINDINGS: There is no evidence of fracture or dislocation. The tibia and fibula appear grossly intact. There is narrowing of the medial compartment, with cortical irregularity and sclerotic change. Marginal osteophytes are seen arising at the medial and lateral compartments. No knee joint effusion is seen. Diffuse soft tissue swelling is noted  about the right lower leg. No definite osseous erosions are identified. The ankle mortise is incompletely assessed, but appears grossly unremarkable. IMPRESSION: 1. No evidence of fracture or dislocation. No osseous erosions seen. 2. Mild degenerative change at the right knee, with narrowing of the medial compartment and marginal osteophyte formation. 3. Diffuse soft tissue swelling about the right lower leg. Electronically Signed   By: 09/19/2016 M.D.   On: 11/14/2016 21:18    Procedures Procedures (including critical care time)  Medications Ordered in ED Medications  enoxaparin (LOVENOX) injection 40 mg (not administered)  acetaminophen (TYLENOL)  tablet 650 mg (650 mg Oral Given 11/15/16 0113)    Or  acetaminophen (TYLENOL) suppository 650 mg ( Rectal See Alternative 11/15/16 0113)  polyethylene glycol (MIRALAX / GLYCOLAX) packet 17 g (not administered)  ondansetron (ZOFRAN) tablet 4 mg (not administered)    Or  ondansetron (ZOFRAN) injection 4 mg (not administered)  traMADol (ULTRAM) tablet 50 mg (50 mg Oral Given 11/15/16 0234)  0.9 %  sodium chloride infusion ( Intravenous Transfusing/Transfer 11/15/16 0202)  terbinafine (LAMISIL) 1 % cream ( Topical Given 11/15/16 0114)  HYDROmorphone (DILAUDID) injection 1 mg (not administered)  triamcinolone cream (KENALOG) 0.1 % ( Topical Not Given 11/15/16 0246)  levothyroxine (SYNTHROID, LEVOTHROID) tablet 150 mcg (not administered)  0.9 %  sodium chloride infusion ( Intravenous Transfusing/Transfer 11/15/16 0202)  vancomycin (VANCOCIN) IVPB 1000 mg/200 mL premix (0 mg Intravenous Stopped 11/14/16 2250)  ondansetron (ZOFRAN) injection 4 mg (4 mg Intravenous Given 11/14/16 2128)  traMADol (ULTRAM) tablet 50 mg (50 mg Oral Given 11/14/16 2128)  HYDROmorphone (DILAUDID) injection 0.5 mg (0.5 mg Intravenous Given 11/14/16 2251)     Initial Impression / Assessment and Plan / ED Course  I have reviewed the triage vital signs and the nursing  notes.  Pertinent labs & imaging results that were available during my care of the patient were reviewed by me and considered in my medical decision making (see chart for details).  Clinical Course      Patient to be admitted for continued antibiotics  Final Clinical Impressions(s) / ED Diagnoses   Final diagnoses:  Cellulitis of right lower extremity    New Prescriptions Current Discharge Medication List       Earley Favor, NP 11/15/16 0303    Donnetta Hutching, MD 11/25/16 [phone]

## 2016-11-15 ENCOUNTER — Observation Stay (HOSPITAL_BASED_OUTPATIENT_CLINIC_OR_DEPARTMENT_OTHER): Payer: Self-pay

## 2016-11-15 ENCOUNTER — Encounter (HOSPITAL_COMMUNITY): Payer: Self-pay | Admitting: *Deleted

## 2016-11-15 DIAGNOSIS — M069 Rheumatoid arthritis, unspecified: Secondary | ICD-10-CM

## 2016-11-15 DIAGNOSIS — E039 Hypothyroidism, unspecified: Secondary | ICD-10-CM

## 2016-11-15 DIAGNOSIS — L97819 Non-pressure chronic ulcer of other part of right lower leg with unspecified severity: Secondary | ICD-10-CM

## 2016-11-15 DIAGNOSIS — L97909 Non-pressure chronic ulcer of unspecified part of unspecified lower leg with unspecified severity: Secondary | ICD-10-CM

## 2016-11-15 LAB — HEMOGLOBIN A1C
Hgb A1c MFr Bld: 5.2 % (ref 4.8–5.6)
Mean Plasma Glucose: 103 mg/dL

## 2016-11-15 LAB — CBC WITH DIFFERENTIAL/PLATELET
BASOS ABS: 0 10*3/uL (ref 0.0–0.1)
Basophils Relative: 0 %
EOS ABS: 0.1 10*3/uL (ref 0.0–0.7)
EOS PCT: 2 %
HCT: 43.6 % (ref 36.0–46.0)
HEMOGLOBIN: 14.5 g/dL (ref 12.0–15.0)
LYMPHS PCT: 19 %
Lymphs Abs: 1.6 10*3/uL (ref 0.7–4.0)
MCH: 31.3 pg (ref 26.0–34.0)
MCHC: 33.3 g/dL (ref 30.0–36.0)
MCV: 94.2 fL (ref 78.0–100.0)
Monocytes Absolute: 0.8 10*3/uL (ref 0.1–1.0)
Monocytes Relative: 10 %
NEUTROS PCT: 69 %
Neutro Abs: 5.6 10*3/uL (ref 1.7–7.7)
PLATELETS: 211 10*3/uL (ref 150–400)
RBC: 4.63 MIL/uL (ref 3.87–5.11)
RDW: 15.2 % (ref 11.5–15.5)
WBC: 8.1 10*3/uL (ref 4.0–10.5)

## 2016-11-15 LAB — BASIC METABOLIC PANEL
ANION GAP: 9 (ref 5–15)
BUN: 16 mg/dL (ref 6–20)
CHLORIDE: 100 mmol/L — AB (ref 101–111)
CO2: 25 mmol/L (ref 22–32)
Calcium: 9.4 mg/dL (ref 8.9–10.3)
Creatinine, Ser: 1.11 mg/dL — ABNORMAL HIGH (ref 0.44–1.00)
GFR, EST NON AFRICAN AMERICAN: 53 mL/min — AB (ref >60)
Glucose, Bld: 96 mg/dL (ref 65–99)
POTASSIUM: 3.8 mmol/L (ref 3.5–5.1)
SODIUM: 134 mmol/L — AB (ref 135–145)

## 2016-11-15 LAB — LIPID PANEL
CHOLESTEROL: 224 mg/dL — AB (ref 0–200)
HDL: 51 mg/dL (ref >40)
LDL CALC: 142 mg/dL — AB (ref 0–99)
TRIGLYCERIDES: 157 mg/dL — AB (ref <150)
Total CHOL/HDL Ratio: 4.4 RATIO
VLDL: 31 mg/dL (ref 0–40)

## 2016-11-15 LAB — TSH: TSH: 99.122 u[IU]/mL — ABNORMAL HIGH (ref 0.350–4.500)

## 2016-11-15 LAB — HIV ANTIBODY (ROUTINE TESTING W REFLEX): HIV Screen 4th Generation wRfx: NONREACTIVE

## 2016-11-15 LAB — SEDIMENTATION RATE: Sed Rate: 29 mm/hr — ABNORMAL HIGH (ref 0–22)

## 2016-11-15 LAB — C-REACTIVE PROTEIN

## 2016-11-15 LAB — VITAMIN B12: Vitamin B-12: 281 pg/mL (ref 180–914)

## 2016-11-15 MED ORDER — LEVOFLOXACIN 500 MG PO TABS: 500.0000 mg | ORAL_TABLET | Freq: Every day | ORAL | 0 refills | Status: DC

## 2016-11-15 MED ORDER — POLYETHYLENE GLYCOL 3350 17 G PO PACK: 17.0000 g | PACK | Freq: Every day | ORAL | Status: DC | PRN

## 2016-11-15 MED ORDER — OXYCODONE-ACETAMINOPHEN 5-325 MG PO TABS
1.0000 | ORAL_TABLET | ORAL | Status: DC | PRN
  Administered 2016-11-15: 1 via ORAL
  Filled 2016-11-15: qty 1

## 2016-11-15 MED ORDER — OXYCODONE-ACETAMINOPHEN 5-325 MG PO TABS: 1.0000 | ORAL_TABLET | Freq: Four times a day (QID) | ORAL | 0 refills | Status: DC | PRN

## 2016-11-15 MED ORDER — LEVOTHYROXINE SODIUM 150 MCG PO TABS: 150.0000 ug | ORAL_TABLET | Freq: Every day | ORAL | 2 refills | Status: DC

## 2016-11-15 MED ORDER — SIMVASTATIN 20 MG PO TABS: 20.0000 mg | ORAL_TABLET | Freq: Every day | ORAL | Status: DC

## 2016-11-15 MED ORDER — SIMVASTATIN 20 MG PO TABS: 20.0000 mg | ORAL_TABLET | Freq: Every day | ORAL | 2 refills | Status: DC

## 2016-11-15 MED ORDER — ONDANSETRON HCL 4 MG/2ML IJ SOLN: 4.0000 mg | Freq: Four times a day (QID) | INTRAMUSCULAR | Status: DC | PRN

## 2016-11-15 MED ORDER — LEVOFLOXACIN 500 MG PO TABS
500.0000 mg | ORAL_TABLET | Freq: Every day | ORAL | Status: DC
  Administered 2016-11-15: 500 mg via ORAL
  Filled 2016-11-15: qty 1

## 2016-11-15 MED ORDER — ACETAMINOPHEN 650 MG RE SUPP: 650.0000 mg | Freq: Four times a day (QID) | RECTAL | Status: DC | PRN

## 2016-11-15 MED ORDER — LEVOTHYROXINE SODIUM 75 MCG PO TABS
150.0000 ug | ORAL_TABLET | Freq: Every day | ORAL | Status: DC
Start: 2016-11-15 — End: 2016-11-15
  Administered 2016-11-15: 150 ug via ORAL
  Filled 2016-11-15: qty 2

## 2016-11-15 MED ORDER — BETAMETHASONE VALERATE 0.1 % EX LOTN: TOPICAL_LOTION | Freq: Two times a day (BID) | CUTANEOUS | Status: DC

## 2016-11-15 MED ORDER — ACETAMINOPHEN 325 MG PO TABS
650.0000 mg | ORAL_TABLET | Freq: Four times a day (QID) | ORAL | Status: DC | PRN
  Administered 2016-11-15: 650 mg via ORAL
  Filled 2016-11-15: qty 2

## 2016-11-15 MED ORDER — HYDROMORPHONE HCL 2 MG/ML IJ SOLN
1.0000 mg | INTRAMUSCULAR | Status: DC | PRN
  Administered 2016-11-15 (×2): 1 mg via INTRAVENOUS
  Filled 2016-11-15 (×2): qty 1

## 2016-11-15 MED ORDER — TERBINAFINE HCL 1 % EX CREA
TOPICAL_CREAM | Freq: Two times a day (BID) | CUTANEOUS | Status: DC
  Administered 2016-11-15 (×2): via TOPICAL
  Filled 2016-11-15: qty 12

## 2016-11-15 MED ORDER — ONDANSETRON HCL 4 MG PO TABS
4.0000 mg | ORAL_TABLET | Freq: Four times a day (QID) | ORAL | Status: DC | PRN
Start: 2016-11-15 — End: 2016-11-15

## 2016-11-15 MED ORDER — ENOXAPARIN SODIUM 40 MG/0.4ML ~~LOC~~ SOLN
40.0000 mg | SUBCUTANEOUS | Status: DC
  Administered 2016-11-15: 40 mg via SUBCUTANEOUS
  Filled 2016-11-15: qty 0.4

## 2016-11-15 MED ORDER — TRIAMCINOLONE ACETONIDE 0.1 % EX CREA
TOPICAL_CREAM | Freq: Two times a day (BID) | CUTANEOUS | Status: DC
  Administered 2016-11-15: 10:00:00 via TOPICAL
  Filled 2016-11-15: qty 30

## 2016-11-15 NOTE — ED Notes (Signed)
Pt placed in hospital bed

## 2016-11-15 NOTE — Consult Note (Addendum)
WOC Nurse wound consult note Reason for Consult: Right lower extremity wound Wound type:partial thickness ulceration to right lower extremity Pressure Ulcer POA:No Measurement:6cmx5cmx0.1cim Wound bed: 70%yellow, 30% pink tissue Drainage (amount, consistency, odor): scant amount of sanguineous drainage present on removed dressing Periwound:erythematous and scaly Dressing procedure/placement/frequency: Clean with NS, cut sliver hydrofiber to fit, moistenslightly with saline and apply to wound bed, Then cover with bordered foam dressing. Change every other day.  ABI ordered but has not been completed yet. Consider unna boot to be applied by ortho tech if ABI is greater than 0.8.   Durel Salts FNP-C, [facility]  Discussed POC with patient and bedside nurse.  Re consult if needed, will not follow at this time. Thanks  Donyelle Enyeart [employer], RN,CWOCN, CNS [phone])

## 2016-11-15 NOTE — Progress Notes (Signed)
   Subjective: Complaining of severe pain from her RLE ulcer. She has remained afebrile since admission.   Objective:  Vital signs in last 24 hours: Vitals:   11/15/16 0030 11/15/16 0150 11/15/16 0213 11/15/16 0703  BP: 127/76 142/86 134/77 (!) 103/53  Pulse: 67 (!) 56 (!) 56 (!) 59  Resp: 18  18 17   Temp:   98.8 F (37.1 C) 97.9 F (36.6 C)  TempSrc:   Oral Oral  SpO2: 96% 98% 100% 96%  Weight:      Height:       Physical Exam Constitutional: NAD, appears comfortable HEENT: Atraumatic, normocephalic. PERRL, anicteric sclera.  Neck: Supple, trachea midline.  Cardiovascular: RRR, no murmurs, rubs, or gallops.  Pulmonary/Chest: CTAB, no wheezes, rales, or rhonchi.  Abdominal: Soft, non tender, non distended. +BS.  Extremities: R lower extremity 6 x 5 cm open ulcer without drainage. Minimal surrounding erythema and very tender to palpation. Distal pulses intact, warm and well perfused extremities. Minimal edema.  Neurological: A&Ox3, CN II - XII grossly intact.      Assessment/Plan:  RLE Ulcer: Etiology unclear. Possibly secondary to venous insufficiency vs. infection. Distal pulses are 2+ bilaterally and extremities are warm and well perfused, arterial insufficiency is unlikely. Patient also has a history of untreated RA, pyoderma gangrenosum is a possibility. She has remained afebrile since admission and wbc is normal. Plain films of her right tibia/fibula showed no sings of osteomyelitis. Sed rate mildly elevated at 29 and CRP was normal. Patient received IV vanc x 1 in the ED.  -- Wound care consult, appreciate recommendations  -- Start Levaquin 500 mg daily x 7 days for possible infection -- Tylenol prn, tramadol 50 mg q6 prn, and percocet 5-325 q4 prn for pain   Hypothyroidism: S/p partial thyroid resection. Patient has been noncompliant with her synthroid for several months. TSH on admission was 99.  -- Restarted home synthroid dose 150 mcg daily   Rheumatoid Arthritis:  Previously on Enbrel but lost insurance and has been unable to afford the medication. Has not been treated for many years. -- Outpatient follow up for other treatment considerations   HTN: non complaine with home HCTZ 25 mg, normotensive this admission -- Continue to monitor   HLD: Previously on simvastatin 20 mg but non complaint. 10 year risk of 9.4%, meets criteria for moderate to high intensity statin.  -- Restart simvastatin 20 mg daily -- Recheck lipids 6-8 weeks outpatient  FEN: no fluids, replete lytes prn, regular diet VTE ppx: Lovenox  Code Status: FULL   Dispo: Anticipated discharge today.  , MD 11/15/2016, 1:04 PM Pager: [phone]

## 2016-11-15 NOTE — Progress Notes (Signed)
Orthopedic Tech Progress Note Patient Details:  [name] 1956-06-20 [id number]  Ortho Devices Type of Ortho Device: Roland Rack boot Ortho Device/Splint Location: rle Ortho Device/Splint Interventions: Application   Lyllian Gause 11/15/2016, 2:50 PM

## 2016-11-15 NOTE — Discharge Summary (Signed)
Name: [name] MRN: [id number] DOB: 1956/06/15 60 y.o. PCP: No Pcp Per Patient  Date of Admission: 11/14/2016  8:03 PM Date of Discharge: 11/15/2016 Attending Physician: No att. providers found  Discharge Diagnosis: 1. Right Lower Extremity Ulcer 2. Hypothyroidism 3. Rheumatoid Arthritis   Discharge Medications:   Medication List    STOP taking these medications   clindamycin 300 MG capsule Commonly known as:  CLEOCIN   doxycycline 100 MG capsule Commonly known as:  VIBRAMYCIN   HYDROcodone-acetaminophen 5-325 MG tablet Commonly known as:  NORCO/VICODIN   predniSONE 10 MG (21) Tbpk tablet Commonly known as:  STERAPRED UNI-PAK 21 TAB     TAKE these medications   levofloxacin 500 MG tablet Commonly known as:  LEVAQUIN Take 1 tablet (500 mg total) by mouth daily.   levothyroxine 150 MCG tablet Commonly known as:  SYNTHROID, LEVOTHROID Take 1 tablet (150 mcg total) by mouth daily before breakfast.   oxyCODONE-acetaminophen 5-325 MG tablet Commonly known as:  PERCOCET/ROXICET Take 1 tablet by mouth every 6 (six) hours as needed for moderate pain or severe pain.   simvastatin 20 MG tablet Commonly known as:  ZOCOR Take 1 tablet (20 mg total) by mouth daily at 6 PM.       Disposition and follow-up:   [name] was discharged from [facility] in Stable condition.  At the hospital follow up visit please address:  1.  RLE Ulcer: Etiology is likely venous statis vs. vascular insufficiency vs. Infection. Patient received 1 dose of IV vanc in ED and was discharged on a 7 day course of Levaquin. Previously "failed" doxycyclin and clindamycin prescribed by an urgent care center, however it is unclear whether the ulcer is truly infected. Patient was discharged in an unna boot and has an appointment in the wound care clinic on Monday 11/18/16. Please see picture below and continue to monitor for progression of the ulcer. Please advise on  continued use of antibiotics if appropriate.  2. Rheumatoid Arthritis: Previously well controlled on Enbrel but lost insurance and has been off treatment for many years. Please refer to rheumatology for systemic treatment.  3. Other Chronic Conditions: Patient has been non compliant with all of her medications due to cost and loss of insurance coverage. She has a history of hypothyroidism and TSH was 99 on admission. She was restarted on her home synthroid dose of 150 mcg daily. She also has a history HLD and was restarted on her home simvastatin 20 mg daily. Her home HCTZ was not restarted because patient was normotensive this admission. Please continue to monitor. Recheck TSH and lipid panel in 6-8 weeks.  2.  Labs / imaging needed at time of follow-up: TSH and Lipid panel (6-8 weeks)  3.  Pending labs/ test needing follow-up: None  Follow-up Appointments: Follow-up Information    [facility]. Go on 11/22/2016.   Why:  At 1015 AM. Please arrive 15 mins early to check in. Contact information: [address] [place] [address] [phone]          Hospital Course by problem list:  1. RLE Ulcer: 6 cm x 5 cm and non purulent. Etiology is likely venous stasis vs. arterial insufficiency vs. infection. Patient presented with worsening pain and right lower extremity ulceration over the past week. She first noticed a small bump on her right ankle in August. Since that time the bump gradually developed into an ulcer. Last week she noticed  some redness around the ulcer and began experiencing worsening pain. She went to an urgent care for evaluation and was given a prescription for doxycycline and clindamycin. She took a few days of the antibiotic without improvement. The ulcer continued to enlarge and she presented to the ED for further evaluation. She endorsed some fevers at home up to 101, however she was afebrile in the ED and throughout her stay. WBC was  normal and wound was non purulent with minimal surrounding erythema. Distal pulses were 2+ bilaterally and lower extremities were warm and well perfused. Plain films of her tibia/fibula showed no signs of osteomyelitis. Patient is not a diabetic. Glucose was normal on her CMP and A1C this admission was 5.2. Sed rate was mildly elevated at 29 and CRP was normal. She received one dose of IV vanc in the ED. Wound care was consulted. She underwent ABIs with 0.88 on the Right and 1.21 on the left (both within normal limits at rest) however the tech was unable to obtain right dorsalis pedis and posterior tibial pressures due to her open wound. She was placed in an unna boot and discharged with a 7 day course of Levaquin as well as percocet (5 days) prn for pain. She was discharged with plans for outpatient follow up in the wound care clinic Monday 11/18/16 and out patient [facility] Friday 11/22/16.   2. Hypothyroidism: S/p partial thyroid resection. Patient has been noncompliant with her synthroid for several months due to insurance issues. TSH on admission was 99. She was restarted on her home synthroid dose 150 mcg this admission.  3. Rheumatoid Arthritis: Previously on Enbrel but lost insurance and has been unable to afford the medication. Has not been treated for many years. Will need outpatient referral to rheumatology for other treatment considerations.  4. HTN: Non complaine with home HCTZ 25 mg, normotensive this admission. Antihypertensives were not restarted.  5. HLD: Previously on simvastatin 20 mg but non complaint. Lipid panel this admission with elevated cholesterol 224 and LDL 142. 10 year risk of 9.4%, meets criteria for moderate to high intensity statin. Restarted her simvastatin 20 mg daily. She will need lipids rechecked in 6-8 weeks.   Discharge Vitals:   BP (!) 103/53 (BP Location: Right Arm)   Pulse (!) 59   Temp 97.9 F (36.6 C) (Oral)   Resp 17   Ht 5\' 3"  (1.6 m)   Wt 260 lb (117.9 kg)    SpO2 96%   BMI 46.06 kg/m    Pertinent Labs, Studies, and Procedures:   11/14/2016 DG Tibia/Fibula Right: IMPRESSION: 1. No evidence of fracture or dislocation. No osseous erosions seen. 2. Mild degenerative change at the right knee, with narrowing of the medial compartment and marginal osteophyte formation. 3. Diffuse soft tissue swelling about the right lower leg.      Ref. Range 11/14/2016 20:08  Sed Rate Latest Ref Range: 0 - 22 mm/hr 29 (H)    Ref. Range 11/14/2016 20:08  CRP Latest Ref Range: <1.0 mg/dL <6.5    Ref. Range 11/14/2016 23:00  TSH Latest Ref Range: 0.350 - 4.500 uIU/mL 99.122 (H)    Ref. Range 11/14/2016 23:01  Hemoglobin A1C Latest Ref Range: 4.8 - 5.6 % 5.2   LIPID PANEL  Ref. Range 11/15/2016 05:43  Cholesterol Latest Ref Range: 0 - 200 mg/dL 035 (H)  Triglycerides Latest Ref Range: <150 mg/dL 465 (H)  HDL Cholesterol Latest Ref Range: >40 mg/dL 51  LDL (calc) Latest Ref Range: 0 -  99 mg/dL 176 (H)  VLDL Latest Ref Range: 0 - 40 mg/dL 31  Total CHOL/HDL Ratio Latest Units: RATIO 4.4    Discharge Instructions: Discharge Instructions    Call MD for:  redness, tenderness, or signs of infection (pain, swelling, redness, odor or green/yellow discharge around incision site)    Complete by:  As directed    Call MD for:  severe uncontrolled pain    Complete by:  As directed    Call MD for:  temperature >100.4    Complete by:  As directed    Diet - low sodium heart healthy    Complete by:  As directed    Discharge instructions    Complete by:  As directed    Please continue to take your antibiotic (Levaquin) once a day for the next 6 days. Stop taking the antibiotics that were previously prescribed. We have restarted you on your thyroid medication and your lipid medication. Please take both of these medicines (synthroid and simvastatin) daily. You are scheduled for follow up in our clinic next Friday the 8th. Please keep this appointment so that we can  follow up on your leg wound. We will also refer you to rheumatology at this appointment for your rheumatoid arthritis. For your leg, please keep your wrap on tonight and all day tomorrow. You may remove the bandages on Sunday and clean the area with water. Pat the area completely dry after washing. You may apply some Vaseline to the area before re wrapping. Keep the area covered with a bandage. Please keep your appointment this Monday in the wound care clinic for further instructions on how to care for your leg.  If you have any questions or concerns, call our clinic at [phone] or after hours call [phone] and ask for the internal medicine resident on call. Thank you!   Increase activity slowly    Complete by:  As directed       Signed: Reymundo Poll, MD 11/17/2016, 9:09 PM   Pager: [phone]

## 2016-11-15 NOTE — Care Management Note (Signed)
Case Management Note  Patient Details  Name: [name] MRN: [id number] Date of Birth: 10-30-1956  Subjective/Objective:    60 yr old female admitted with RLE ulcer.                Action/Plan:  Case manager provided patient with MATCH Letter for medication assistance. No further CM needs identified.    Expected Discharge Date:   11/15/16               Expected Discharge Plan:  Home/Self Care  In-House Referral:  NA  Discharge planning Services  CM Consult, MATCH Program, Medication Assistance  Post Acute Care Choice:  NA Choice offered to:     DME Arranged:  N/A DME Agency:  NA  HH Arranged:  NA HH Agency:  NA  Status of Service:  Completed, signed off  If discussed at Long Length of Stay Meetings, dates discussed:    Additional Comments:  Durenda Guthrie, RN 11/15/2016, 2:54 PM

## 2016-11-15 NOTE — Progress Notes (Signed)
VASCULAR LAB PRELIMINARY  ARTERIAL  ABI completed: Unable to obtain right dorsalis pedis, and posterior tibial pressures due to an open wound. Posterior tibial and dorsalis pedis waveforms appear strong and triphasic. Right TBI of 0.88 is suggestive of arterial flow within normal limits at rest.  Left ABI of 1.21 is suggestive of arterial flow within normal limits at rest.   RIGHT    LEFT    PRESSURE WAVEFORM  PRESSURE WAVEFORM  BRACHIAL 146 Triphasic BRACHIAL 129 Triphasic  DP  Triphasic DP 166 Triphasic  PT  Triphasic PT 176 Triphasic  GREAT TOE 129 NA GREAT TOE 132 NA    RIGHT LEFT  ABI TBI  0.88 1.21     Elsie Stain, RVT 11/15/2016, 3:08 PM

## 2016-11-16 LAB — HEPATITIS C ANTIBODY

## 2016-11-18 ENCOUNTER — Encounter: Payer: Self-pay | Attending: Surgery | Admitting: Surgery

## 2016-11-18 DIAGNOSIS — Z880 Allergy status to penicillin: Secondary | ICD-10-CM | POA: Insufficient documentation

## 2016-11-18 DIAGNOSIS — M0560 Rheumatoid arthritis of unspecified site with involvement of other organs and systems: Secondary | ICD-10-CM | POA: Insufficient documentation

## 2016-11-18 DIAGNOSIS — Z79899 Other long term (current) drug therapy: Secondary | ICD-10-CM | POA: Insufficient documentation

## 2016-11-18 DIAGNOSIS — I83012 Varicose veins of right lower extremity with ulcer of calf: Secondary | ICD-10-CM | POA: Insufficient documentation

## 2016-11-18 DIAGNOSIS — J449 Chronic obstructive pulmonary disease, unspecified: Secondary | ICD-10-CM | POA: Insufficient documentation

## 2016-11-18 DIAGNOSIS — L97212 Non-pressure chronic ulcer of right calf with fat layer exposed: Secondary | ICD-10-CM | POA: Insufficient documentation

## 2016-11-18 DIAGNOSIS — F17218 Nicotine dependence, cigarettes, with other nicotine-induced disorders: Secondary | ICD-10-CM | POA: Insufficient documentation

## 2016-11-18 DIAGNOSIS — I10 Essential (primary) hypertension: Secondary | ICD-10-CM | POA: Insufficient documentation

## 2016-11-19 ENCOUNTER — Ambulatory Visit: Payer: Self-pay

## 2016-11-19 LAB — CULTURE, BLOOD (ROUTINE X 2)
CULTURE: NO GROWTH
CULTURE: NO GROWTH

## 2016-11-19 NOTE — Progress Notes (Signed)
Marilyn, [name] ([id number]) Visit Report for 11/18/2016 Chief Complaint Document Details Patient Name: [name], [name] 11/18/2016 9:45 Date of Service: AM Medical Record [id number] Number: Patient Account Number: [id number] 28-Jun-1956 (60 y.o. Treating RN: Phillis Haggis Date of Birth/Sex: Female) Other Clinician: Primary Care Physician: PATIENT, NO Treating Michelina Mexicano Referring Physician: Earl Lagos Physician/Extender: Weeks in Treatment: 0 Information Obtained from: Patient Chief Complaint Patient presents for treatment of an open ulcer due to venous insufficiency to her right lower extremity which she's had for over 6 months Electronic Signature(s) Signed: 11/18/2016 11:14:55 AM By: Evlyn Kanner MD, FACS Entered By: Evlyn Kanner on 11/18/2016 11:14:55 Dillingham, [name] ([id number]) -------------------------------------------------------------------------------- HPI Details Patient Name: [name], [name] 11/18/2016 9:45 Date of Service: AM Medical Record [id number] Number: Patient Account Number: [id number] [birth year] (60 y.o. Treating RN: Phillis Haggis Date of Birth/Sex: Female) Other Clinician: Primary Care Physician: PATIENT, NO Treating Peytan Andringa Referring Physician: Earl Lagos Physician/Extender: Weeks in Treatment: 0 History of Present Illness Location: ulcerated area on the right lower extremity Quality: Patient reports experiencing a sharp pain to affected area(s). Severity: Patient states wound are getting worse. Duration: Patient has had the wound for > 6 months prior to seeking treatment at the wound center Timing: Pain in wound is constant (hurts all the time) Context: The wound appeared gradually over time Modifying Factors: Other treatment(s) tried include:recent admission to hospital for a cellulitis and was treated with antibiotics and an Unna boot Associated Signs and Symptoms: Patient reports having increase swelling. HPI  Description: 60 year old patient who was recently admitted to the hospital between 11/14/2016 and 11/15/2016 for right lower extremity ulcer with cellulitis. she was asked to stop clindamycin and doxycycline and her prednisone and take Levaquin 500 mg daily by mouth. she was treated for a right lower extremity possibly due to venous stasis versus vascular insufficiency versus infection. She received IV vancomycin during her hospital stay. He is known to have rheumatoid arthritis and stopped taking her treatment because of lack of insurance. during her hospital stay vascular studies were done -- ABIs were 0.88 on the right and 1.1 on the left, she had triphasic flow on both lower extremities and a plain film of the right tibia and fibula showed no signs of osteomyelitis. She is not a diabetic. She is a smoker however and continued to smoke. lab work noted that her sedimentation rate was 29, CRP was less than 0.8, hemoglobin A1c was 5.2 Electronic Signature(s) Signed: 11/18/2016 11:15:52 AM By: Evlyn Kanner MD, FACS Previous Signature: 11/18/2016 10:55:05 AM Version By: Evlyn Kanner MD, FACS Previous Signature: 11/18/2016 10:45:43 AM Version By: Evlyn Kanner MD, FACS Previous Signature: 11/18/2016 10:41:07 AM Version By: Evlyn Kanner MD, FACS Entered By: Evlyn Kanner on 11/18/2016 11:15:51 Mattie, [name] ([id number]) -------------------------------------------------------------------------------- Physical Exam Details Patient Name: Marilyn, [name] 11/18/2016 9:45 Date of Service: AM Medical Record [id number] Number: Patient Account Number: [id number] June 14, 1956 (60 y.o. Treating RN: Phillis Haggis Date of Birth/Sex: Female) Other Clinician: Primary Care Physician: PATIENT, NO Treating Taneeka Curtner Referring Physician: Earl Lagos Physician/Extender: Weeks in Treatment: 0 Constitutional . Pulse regular. Respirations normal and unlabored. Afebrile. . Eyes Nonicteric.  Reactive to light. Ears, Nose, Mouth, and Throat Lips, teeth, and gums WNL.Marland Kitchen Moist mucosa without lesions. Neck supple and nontender. No palpable supraclavicular or cervical adenopathy. Normal sized without goiter. Respiratory WNL. No retractions.. Cardiovascular Pedal Pulses WNL. recent ABIs were done in the hospital and this has been noted by me. No clubbing, cyanosis or edema. Chest Breasts  symmetical and no nipple discharge.. Breast tissue WNL, no masses, lumps, or tenderness.. Gastrointestinal (GI) Abdomen without masses or tenderness.. No liver or spleen enlargement or tenderness.. Lymphatic No adneopathy. No adenopathy. No adenopathy. Musculoskeletal Adexa without tenderness or enlargement.. Digits and nails w/o clubbing, cyanosis, infection, petechiae, ischemia, or inflammatory conditions.. Integumentary (Hair, Skin) No suspicious lesions. No crepitus or fluctuance. No peri-wound warmth or erythema. No masses.Marland Kitchen Psychiatric Judgement and insight Intact.. No evidence of depression, anxiety, or agitation.. Notes she shouldn't has stigmata of varicose veins and has clinically not varicose veins visible on the right lower extremity with skin changes significant for the disease. The ulcerated area has a lot of slough but is extremely tender and I'm unable to sharply debride this in spite of a lot of local lidocaine cream Electronic Signature(s) Signed: 11/18/2016 11:16:49 AM By: Evlyn Kanner MD, FACS NITI, KOHTZ ([id number]) Entered By: Evlyn Kanner on 11/18/2016 11:16:49 Berkel, [name] ([id number]) -------------------------------------------------------------------------------- Physician Orders Details Patient Name: Marilyn, [name] 11/18/2016 9:45 Date of Service: AM Medical Record [id number] Number: Patient Account Number: [id number] 22-Nov-1956 (60 y.o. Treating RN: Phillis Haggis Date of Birth/Sex: Female) Other Clinician: Primary Care Physician: PATIENT, NO  Treating Adrienna Karis Referring Physician: Earl Lagos Physician/Extender: Tania Ade in Treatment: 0 Verbal / Phone Orders: Yes Clinician: Pinkerton, Debi Read Back and Verified: Yes Diagnosis Coding Wound Cleansing Wound #1 Right,Anterior Lower Leg o Clean wound with Normal Saline. o Cleanse wound with mild soap and water Anesthetic Wound #1 Right,Anterior Lower Leg o Topical Lidocaine 4% cream applied to wound bed prior to debridement Primary Wound Dressing Wound #1 Right,Anterior Lower Leg o Exufiber Ag+ Secondary Dressing Wound #1 Right,Anterior Lower Leg o ABD pad o Dry Gauze o XtraSorb o Other - Charcoal Dressing Change Frequency Wound #1 Right,Anterior Lower Leg o Change dressing every week Follow-up Appointments Wound #1 Right,Anterior Lower Leg o Return Appointment in 1 week. Edema Control Wound #1 Right,Anterior Lower Leg o 3 Layer Compression System - Right Lower Extremity - anchor with unna Additional Orders / Instructions ZARYN, WHEATCRAFT. ([id number]) Wound #1 Right,Anterior Lower Leg o Stop Smoking o Increase protein intake. Medications-please add to medication list. Wound #1 Right,Anterior Lower Leg o P.O. Antibiotics - continue your Levaquin o Other: - Vitamin C, Zinc, Vitamin A, MVI Services and Therapies o Venous Duplex Doppler - [facility] [facility] Electronic Signature(s) Signed: 11/18/2016 4:26:33 PM By: Evlyn Kanner MD, FACS Signed: 11/18/2016 5:33:36 PM By: Alejandro Mulling Entered By: Alejandro Mulling on 11/18/2016 10:59:07 Calaway, [name] ([id number]) -------------------------------------------------------------------------------- Problem List Details Patient Name: CHAMAINE, ANDEL 11/18/2016 9:45 Date of Service: AM Medical Record [id number] Number: Patient Account Number: [id number] 04-05-1956 (60 y.o. 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 11/18/2016 Yes exposed I83.012 Varicose veins of right lower extremity with ulcer of calf 11/18/2016 Yes F17.218 Nicotine dependence, cigarettes, with other nicotine- 11/18/2016 Yes induced disorders M05.60 Rheumatoid arthritis of unspecified site with involvement 11/18/2016 Yes of other organs and systems Inactive Problems Resolved Problems Electronic Signature(s) Signed: 11/18/2016 11:14:35 AM By: Evlyn Kanner MD, FACS Entered By: Evlyn Kanner on 11/18/2016 11:14:34 Awtrey, [name] ([id number]) -------------------------------------------------------------------------------- Progress Note Details Patient Name: KYLER, MOLSKI 11/18/2016 9:45 Date of Service: AM Medical Record [id number] Number: Patient Account Number: [id number] 1956-01-11 (60 y.o. Treating RN:  Ashok Cordia, Debi Date of Birth/Sex: Female) Other Clinician: Primary Care Physician: PATIENT, NO Treating Otha Monical Referring Physician: Earl Lagos Physician/Extender: Weeks in Treatment: 0 Subjective Chief Complaint Information obtained from Patient Patient presents for treatment of an open ulcer due to venous insufficiency to her right lower extremity which she's had for over 6 months History of Present Illness (HPI) The following HPI elements were documented for the patient's wound: Location: ulcerated area on the right lower extremity Quality: Patient reports experiencing a sharp pain to affected area(s). Severity: Patient states wound are getting worse. Duration: Patient has had the wound for > 6 months prior to seeking treatment at the wound center Timing: Pain in wound is constant (hurts all the time) Context: The wound appeared gradually over  time Modifying Factors: Other treatment(s) tried include:recent admission to hospital for a cellulitis and was treated with antibiotics and an Unna boot Associated Signs and Symptoms: Patient reports having increase swelling. 60 year old patient who was recently admitted to the hospital between 11/14/2016 and 11/15/2016 for right lower extremity ulcer with cellulitis. she was asked to stop clindamycin and doxycycline and her prednisone and take Levaquin 500 mg daily by mouth. she was treated for a right lower extremity possibly due to venous stasis versus vascular insufficiency versus infection. She received IV vancomycin during her hospital stay. He is known to have rheumatoid arthritis and stopped taking her treatment because of lack of insurance. during her hospital stay vascular studies were done -- ABIs were 0.88 on the right and 1.1 on the left, she had triphasic flow on both lower extremities and a plain film of the right tibia and fibula showed no signs of osteomyelitis. She is not a diabetic. She is a smoker however and continued to smoke. lab work noted that her sedimentation rate was 29, CRP was less than 0.8, hemoglobin A1c was 5.2 Wound History Patient presents with 1 open wound that has been present for approximately 5-6 months. Patient has been treating wound in the following manner: vaseline and gauze. Laboratory tests have not been performed in the last month. Patient reportedly has not tested positive for an antibiotic resistant organism. Patient reportedly has not tested positive for osteomyelitis. Patient reportedly has not had testing performed to evaluate circulation in the legs. Patient experiences the following problems associated with their wounds: infection, swelling. BRITTANYA, WINBURN ([id number]) Patient History Information obtained from Patient. Allergies penicillin, latex, strawberry, adhesive tape Family History Cancer - Mother, Father, Diabetes - Maternal  Grandparents, Paternal Grandparents, Mother, Heart Disease - Maternal Grandparents, Paternal Grandparents, Hypertension - Maternal Grandparents, Paternal Grandparents, Kidney Disease - Mother, Lung Disease - Father, Thyroid Problems - Mother, No family history of Hereditary Spherocytosis, Seizures, Stroke, Tuberculosis. Social History Current every day smoker - not even a 1/2 pack, Marital Status - Separated, Alcohol Use - Never, Drug Use - No History, Caffeine Use - Daily. Medical History Respiratory Patient has history of Chronic Obstructive Pulmonary Disease (COPD) Cardiovascular Patient has history of Hypertension Musculoskeletal Patient has history of Rheumatoid Arthritis Review of Systems (ROS) Constitutional Symptoms (General Health) Complains or has symptoms of Chills. Eyes The patient has no complaints or symptoms. Ear/Nose/Mouth/Throat The patient has no complaints or symptoms. Hematologic/Lymphatic The patient has no complaints or symptoms. Gastrointestinal Complains or has symptoms of Nausea, GERD, constipation Endocrine Complains or has symptoms of Thyroid disease. Genitourinary The patient has no complaints or symptoms. Immunological The patient has no complaints or symptoms. Integumentary (Skin) Complains or has symptoms of Wounds. Neurologic The patient  has no complaints or symptoms. Oncologic The patient has no complaints or symptoms. Psychiatric Complains or has symptoms of Anxiety, depression Dieudonne, Rickey K. ([id number]) Medications simvastatin 20 mg tablet oral tablet oral oxycodone-acetaminophen 5 mg-325 mg tablet oral tablet oral levofloxacin 500 mg tablet oral tablet oral levothyroxine 150 mcg tablet oral tablet oral Objective Constitutional Pulse regular. Respirations normal and unlabored. Afebrile. Vitals Time Taken: 10:07 AM, Height: 63 in, Source: Stated, Weight: 214.7 lbs, Source: Measured, BMI: 38, Temperature: 98.1 F, Pulse: 80 bpm,  Respiratory Rate: 18 breaths/min, Blood Pressure: 145/93 mmHg. Eyes Nonicteric. Reactive to light. Ears, Nose, Mouth, and Throat Lips, teeth, and gums WNL.Marland Kitchen Moist mucosa without lesions. Neck supple and nontender. No palpable supraclavicular or cervical adenopathy. Normal sized without goiter. Respiratory WNL. No retractions.. Cardiovascular Pedal Pulses WNL. recent ABIs were done in the hospital and this has been noted by me. No clubbing, cyanosis or edema. Chest Breasts symmetical and no nipple discharge.. Breast tissue WNL, no masses, lumps, or tenderness.. Gastrointestinal (GI) Abdomen without masses or tenderness.. No liver or spleen enlargement or tenderness.. Lymphatic No adneopathy. No adenopathy. No adenopathy. Musculoskeletal AKI, BURDIN ([id number]) Adexa without tenderness or enlargement.. Digits and nails w/o clubbing, cyanosis, infection, petechiae, ischemia, or inflammatory conditions.Marland Kitchen Psychiatric Judgement and insight Intact.. No evidence of depression, anxiety, or agitation.. General Notes: she shouldn't has stigmata of varicose veins and has clinically not varicose veins visible on the right lower extremity with skin changes significant for the disease. The ulcerated area has a lot of slough but is extremely tender and I'm unable to sharply debride this in spite of a lot of local lidocaine cream Integumentary (Hair, Skin) No suspicious lesions. No crepitus or fluctuance. No peri-wound warmth or erythema. No masses.. Wound #1 status is Open. Original cause of wound was Gradually Appeared. The wound is located on the Right,Anterior Lower Leg. The wound measures 5.3cm length x 4.3cm width x 0.1cm depth; 17.899cm^2 area and 1.79cm^3 volume. The wound is limited to skin breakdown. There is no tunneling or undermining noted. There is a large amount of purulent drainage noted. The wound margin is distinct with the outline attached to the wound base. There is no  granulation within the wound bed. There is a large (67-100%) amount of necrotic tissue within the wound bed including Adherent Slough. The periwound skin appearance exhibited: Localized Edema, Moist, Erythema. The surrounding wound skin color is noted with erythema which is circumferential. Periwound temperature was noted as No Abnormality. The periwound has tenderness on palpation. Assessment Active Problems ICD-10 L97.212 - Non-pressure chronic ulcer of right calf with fat layer exposed I83.012 - Varicose veins of right lower extremity with ulcer of calf F17.218 - Nicotine dependence, cigarettes, with other nicotine-induced disorders M05.60 - Rheumatoid arthritis of unspecified site with involvement of other organs and systems the patient shows signs and symptoms of varicose veins with evidence of venous hypertension and ulceration of the right lower extremity and has never had a workup for this. After review I have recommended: 1. Silver alginate and a 3 layer Profore compression to be changed every week 2. Elevation and exercise 3. venous duplex studies of both lower extremity to be done soon as her insurance coverage is achieved DAMYIAH, MOXLEY K. ([id number]) 4. I have spent 3 minutes discussing with her the need to completely give up smoking and I discussed the risk benefits and alternatives and the possible methods of doing so 5. regular visits to the wound center Plan Wound Cleansing: Wound #1 Right,Anterior  Lower Leg: Clean wound with Normal Saline. Cleanse wound with mild soap and water Anesthetic: Wound #1 Right,Anterior Lower Leg: Topical Lidocaine 4% cream applied to wound bed prior to debridement Primary Wound Dressing: Wound #1 Right,Anterior Lower Leg: Exufiber Ag+ Secondary Dressing: Wound #1 Right,Anterior Lower Leg: ABD pad Dry Gauze XtraSorb Other - Charcoal Dressing Change Frequency: Wound #1 Right,Anterior Lower Leg: Change dressing every week Follow-up  Appointments: Wound #1 Right,Anterior Lower Leg: Return Appointment in 1 week. Edema Control: Wound #1 Right,Anterior Lower Leg: 3 Layer Compression System - Right Lower Extremity - anchor with unna Additional Orders / Instructions: Wound #1 Right,Anterior Lower Leg: Stop Smoking Increase protein intake. Medications-please add to medication list.: Wound #1 Right,Anterior Lower Leg: P.O. Antibiotics - continue your Levaquin Other: - Vitamin C, Zinc, Vitamin A, MVI Services and Therapies ordered were: Venous Duplex Doppler - [place] [facility] RHANDA, DALL ([id number]) the patient shows signs and symptoms of varicose veins with evidence of venous hypertension and ulceration of the right lower extremity and has never had a workup for this. After review I have recommended: 1. Silver alginate and a 3 layer Profore compression to be changed every week 2. Elevation and exercise 3. venous duplex studies of both lower extremity to be done soon as her insurance coverage is achieved 4. I have spent 3 minutes discussing with her the need to completely give up smoking and I discussed the risk benefits and alternatives and the possible methods of doing so 5. regular visits to the wound center Electronic Signature(s) Signed: 11/18/2016 11:19:09 AM By: Evlyn Kanner MD, FACS Entered By: Evlyn Kanner on 11/18/2016 11:19:09 Rotundo, [name] ([id number]) -------------------------------------------------------------------------------- ROS/PFSH Details Patient Name: ILISHA, VIELMA 11/18/2016 9:45 Date of Service: AM Medical Record [id number] Number: Patient Account Number: [id number] [birth year] (60 y.o. Treating RN: Phillis Haggis Date of Birth/Sex: Female) Other Clinician: Primary Care Physician: PATIENT, NO Treating Chevella Pearce Referring Physician: Earl Lagos Physician/Extender: Weeks in Treatment: 0 Information Obtained From Patient Wound History Do you currently have one or  more open woundso Yes How many open wounds do you currently haveo 1 Approximately how long have you had your woundso 5-6 months How have you been treating your wound(s) until nowo vaseline and gauze Has your wound(s) ever healed and then re-openedo No Have you had any lab work done in the past montho No Have you tested positive for an antibiotic resistant organism (MRSA, VRE)o No Have you tested positive for osteomyelitis (bone infection)o No Have you had any tests for circulation on your legso No Have you had other problems associated with your woundso Infection, Swelling Constitutional Symptoms (General Health) Complaints and Symptoms: Positive for: Chills Gastrointestinal Complaints and Symptoms: Positive for: Nausea Review of System Notes: GERD, constipation Endocrine Complaints and Symptoms: Positive for: Thyroid disease Integumentary (Skin) Complaints and Symptoms: Positive for: Wounds Psychiatric Complaints and Symptoms: Positive for: Anxiety CHARLESA, CZARNECKI. ([id number]) Review of System Notes: depression Eyes Complaints and Symptoms: No Complaints or Symptoms Ear/Nose/Mouth/Throat Complaints and Symptoms: No Complaints or Symptoms Hematologic/Lymphatic Complaints and Symptoms: No Complaints or Symptoms Respiratory Medical History: Positive for: Chronic Obstructive Pulmonary Disease (COPD) Cardiovascular Medical History: Positive for: Hypertension Genitourinary Complaints and Symptoms: No Complaints or Symptoms Immunological Complaints and Symptoms: No Complaints or Symptoms Musculoskeletal Medical History: Positive for: Rheumatoid Arthritis Neurologic Complaints and Symptoms: No Complaints or Symptoms Oncologic Complaints and Symptoms: No Complaints or Symptoms Hagemann, Lyndia K. ([id number]) Immunizations Pneumococcal Vaccine: Received Pneumococcal Vaccination: No Family and Social History Cancer: Yes - Mother,  Father; Diabetes: Yes - Maternal  Grandparents, Paternal Grandparents, Mother; Heart Disease: Yes - Maternal Grandparents, Paternal Grandparents; Hereditary Spherocytosis: No; Hypertension: Yes - Maternal Grandparents, Paternal Grandparents; Kidney Disease: Yes - Mother; Lung Disease: Yes - Father; Seizures: No; Stroke: No; Thyroid Problems: Yes - Mother; Tuberculosis: No; Current every day smoker - not even a 1/2 pack; Marital Status - Separated; Alcohol Use: Never; Drug Use: No History; Caffeine Use: Daily; Financial Concerns: No; Food, Clothing or Shelter Needs: No; Support System Lacking: No; Transportation Concerns: No; Advanced Directives: No; Patient does not want information on Advanced Directives; Do not resuscitate: No; Living Will: No; Medical Power of Attorney: No Physician Affirmation I have reviewed and agree with the above information. Electronic Signature(s) Signed: 11/18/2016 4:26:33 PM By: Evlyn KannerBritto, Dolores Ewing MD, FACS Signed: 11/18/2016 5:33:36 PM By: Alejandro MullingPinkerton, Debra Entered By: Evlyn KannerBritto, Nikiyah Fackler on 11/18/2016 10:32:20 Shed, Evalee JeffersonJACKIE K. ([id number]) -------------------------------------------------------------------------------- SuperBill Details Patient Name: Sheilah MinsPRUITT, Valinda K. Date of Service: 11/18/2016 Medical Record Number: [id number] Patient Account Number: [id number] Date of Birth/Sex: [birth year](60 y.o. Female) Treating RN: Ashok CordiaPinkerton, Debi Primary Care Physician: PATIENT, NO Other Clinician: Referring Physician: Earl LagosNARENDRA, NISCHAL Treating Physician/Extender: Rudene ReBritto, Kailynne Ferrington Weeks in Treatment: 0 Diagnosis Coding ICD-10 Codes Code Description 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 layer exposed I83.012 Varicose veins of right lower extremity with ulcer of calf F17.218 Nicotine dependence, cigarettes, with other nicotine-induced disorders M05.60 Rheumatoid arthritis of unspecified site with involvement of other organs and systems Facility Procedures CPT4 Code Description: [id number]  - WOUND CARE VISIT-LEV 4 EST PT Modifier: Quantity: 1 CPT4 Code Description: [id number] 99406-SMOKING CESSATION 3-10MINS ICD-10 Description Diagnosis 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 l F17.218 Nicotine dependence, cigarettes, with other nicotin Modifier: ayer exposed e-induced dis Quantity: 1 orders Physician Procedures CPT4: Description Modifier Quantity Code [id number] 99204 - WC PHYS LEVEL 4 - NEW PT 25 1 ICD-10 Description Diagnosis 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 layer exposed I83.012 Varicose veins of right lower extremity with ulcer of calf  M05.60 Rheumatoid arthritis of unspecified site with involvement of other organs and systems CPT4: 99406 99406- SMOKING CESSATION 3-10 MINS 1 ICD-10 Description Diagnosis 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 layer exposed F17.218 Nicotine dependence, cigarettes, with other nicotine-induced disorders Sheilah MinsRUITT, Julliette K.  ([id number]) Electronic Signature(s) Signed: 11/18/2016 4:26:33 PM By: Evlyn KannerBritto, Nyala Kirchner MD, FACS Signed: 11/18/2016 5:33:36 PM By: Alejandro MullingPinkerton, Debra Previous Signature: 11/18/2016 11:19:32 AM Version By: Evlyn KannerBritto, Ivan Lacher MD, FACS Entered By: Alejandro MullingPinkerton, Debra on 11/18/2016 11:48:21

## 2016-11-19 NOTE — Progress Notes (Signed)
TAHLIA, DEAMER ([id number]) Visit Report for 11/18/2016 Allergy List Details Patient Name: [name], [name]. Date of Service: 11/18/2016 9:45 AM Medical Record Number: [id number] Patient Account Number: [id number] Date of Birth/Sex: 06-23-1956 (60 y.o. Female) Treating RN: Carolyne Fiscal, Debi Primary Care Physician: PATIENT, NO Other Clinician: Referring Physician: Aldine Contes Treating Physician/Extender: Frann Rider in Treatment: 0 Allergies Active Allergies penicillin latex strawberry adhesive tape Allergy Notes Electronic Signature(s) Signed: 11/18/2016 5:33:36 PM By: Alric Quan Entered By: Alric Quan on 11/18/2016 10:10:58 Jeanlouis, [name] ([id number]) -------------------------------------------------------------------------------- Arrival Information Details Patient Name: [name]. Date of Service: 11/18/2016 9:45 AM Medical Record Number: [id number] Patient Account Number: [id number] Date of Birth/Sex: [birth year] (59 y.o. Female) Treating RN: Carolyne Fiscal, Debi Primary Care Physician: PATIENT, NO Other Clinician: Referring Physician: Aldine Contes Treating Physician/Extender: Frann Rider in Treatment: 0 Visit Information Patient Arrived: Ambulatory Arrival Time: 10:06 Accompanied By: self Transfer Assistance: None Patient Identification Verified: Yes Secondary Verification Process Yes Completed: Patient Requires Transmission- No Based Precautions: Patient Has Alerts: Yes Patient Alerts: L Leg ABI 1.2 R Leg ABI 0.88 ABIs done in [place] Signature(s) Signed: 11/18/2016 5:33:36 PM By: Alric Quan Entered By: Alric Quan on 11/18/2016 10:52:12 [name], [name] ([id number]) -------------------------------------------------------------------------------- Clinic Level of Care Assessment Details Patient Name: [name]. Date of Service: 11/18/2016 9:45 AM Medical Record Number: [id number] Patient  Account Number: [id number] Date of Birth/Sex: October 09, 1956 (60 y.o. Female) Treating RN: Carolyne Fiscal, Debi Primary Care Physician: PATIENT, NO Other Clinician: Referring Physician: Aldine Contes Treating Physician/Extender: Frann Rider in Treatment: 0 Clinic Level of Care Assessment Items TOOL 2 Quantity Score X - Use when only an EandM is performed on the INITIAL visit 1 0 ASSESSMENTS - Nursing Assessment / Reassessment X - General Physical Exam (combine w/ comprehensive assessment (listed just 1 20 below) when performed on new pt. evals) X - Comprehensive Assessment (HX, ROS, Risk Assessments, Wounds Hx, etc.) 1 25 ASSESSMENTS - Wound and Skin Assessment / Reassessment X - Simple Wound Assessment / Reassessment - one wound 1 5 []  - Complex Wound Assessment / Reassessment - multiple wounds 0 []  - Dermatologic / Skin Assessment (not related to wound area) 0 ASSESSMENTS - Ostomy and/or Continence Assessment and Care []  - Incontinence Assessment and Management 0 []  - Ostomy Care Assessment and Management (repouching, etc.) 0 PROCESS - Coordination of Care []  - Simple Patient / Family Education for ongoing care 0 X - Complex (extensive) Patient / Family Education for ongoing care 1 20 X - Staff obtains Programmer, systems, Records, Test Results / Process Orders 1 10 []  - Staff telephones HHA, Nursing Homes / Clarify orders / etc 0 []  - Routine Transfer to another Facility (non-emergent condition) 0 []  - Routine Hospital Admission (non-emergent condition) 0 X - New Admissions / Biomedical engineer / Ordering NPWT, Apligraf, etc. 1 15 []  - Emergency Hospital Admission (emergent condition) 0 X - Simple Discharge Coordination 1 10 [name], [name] ([id number]) []  - Complex (extensive) Discharge Coordination 0 PROCESS - Special Needs []  - Pediatric / Minor Patient Management 0 []  - Isolation Patient Management 0 []  - Hearing / Language / Visual special needs 0 []  - Assessment of  Community assistance (transportation, D/C planning, etc.) 0 []  - Additional assistance / Altered mentation 0 []  - Support Surface(s) Assessment (bed, cushion, seat, etc.) 0 INTERVENTIONS - Wound Cleansing / Measurement X - Wound Imaging (photographs - any number of wounds) 1 5 []  - Wound Tracing (instead of photographs) 0 X - Simple Wound  Measurement - one wound 1 5 []  - Complex Wound Measurement - multiple wounds 0 X - Simple Wound Cleansing - one wound 1 5 []  - Complex Wound Cleansing - multiple wounds 0 INTERVENTIONS - Wound Dressings []  - Small Wound Dressing one or multiple wounds 0 []  - Medium Wound Dressing one or multiple wounds 0 X - Large Wound Dressing one or multiple wounds 1 20 []  - Application of Medications - injection 0 INTERVENTIONS - Miscellaneous []  - External ear exam 0 []  - Specimen Collection (cultures, biopsies, blood, body fluids, etc.) 0 []  - Specimen(s) / Culture(s) sent or taken to Lab for analysis 0 []  - Patient Transfer (multiple staff / Civil Service fast streamer / Similar devices) 0 []  - Simple Staple / Suture removal (25 or less) 0 []  - Complex Staple / Suture removal (26 or more) 0 [name], [name] ([id number]) []  - Hypo / Hyperglycemic Management (close monitor of Blood Glucose) 0 X - Ankle / Brachial Index (ABI) - do not check if billed separately 1 15 Has the patient been seen at the hospital within the last three years: Yes Total Score: 155 Level Of Care: New/Established - Level 4 Electronic Signature(s) Signed: 11/18/2016 5:33:36 PM By: Alric Quan Entered By: Alric Quan on 11/18/2016 11:47:53 [name], [name] ([id number]) -------------------------------------------------------------------------------- Encounter Discharge Information Details Patient Name: [name]. Date of Service: 11/18/2016 9:45 AM Medical Record Number: [id number] Patient Account Number: [id number] Date of Birth/Sex: [birth year] (60 y.o. Female) Treating RN: Carolyne Fiscal,  Debi Primary Care Physician: PATIENT, NO Other Clinician: Referring Physician: Aldine Contes Treating Physician/Extender: Frann Rider in Treatment: 0 Encounter Discharge Information Items Discharge Pain Level: 7 Discharge Condition: Stable Ambulatory Status: Ambulatory Discharge Destination: Home Transportation: Private Auto Accompanied By: self Schedule Follow-up Appointment: Yes Medication Reconciliation completed and provided to Patient/Care No Naleyah Ohlinger: Provided on Clinical Summary of Care: 11/18/2016 Form Type Recipient Paper Patient JP Electronic Signature(s) Signed: 11/18/2016 11:13:40 AM By: Ruthine Dose Entered By: Ruthine Dose on 11/18/2016 11:13:39 Hoeffner, [name] ([id number]) -------------------------------------------------------------------------------- Lower Extremity Assessment Details Patient Name: [name]. Date of Service: 11/18/2016 9:45 AM Medical Record Number: [id number] Patient Account Number: [id number] Date of Birth/Sex: May 09, 1956 (60 y.o. Female) Treating RN: Carolyne Fiscal, Debi Primary Care Physician: PATIENT, NO Other Clinician: Referring Physician: Aldine Contes Treating Physician/Extender: Frann Rider in Treatment: 0 Edema Assessment Assessed: [Left: No] [Right: No] Edema: [Left: No] [Right: Yes] Calf Left: Right: Point of Measurement: 32 cm From Medial Instep 44.6 cm 46.2 cm Ankle Left: Right: Point of Measurement: 9 cm From Medial Instep 25.6 cm 27.3 cm Vascular Assessment Pulses: Posterior Tibial Palpable: [Left:Yes] [Right:Yes] Doppler: [Left:Monophasic] [Right:Multiphasic] Dorsalis Pedis Palpable: [Left:Yes] [Right:Yes] Doppler: [Left:Monophasic] [Right:Multiphasic] Extremity colors, hair growth, and conditions: Extremity Color: [Left:Mottled] [Right:Mottled] Temperature of Extremity: [Left:Cool] [Right:Warm] Capillary Refill: [Left:< 3 seconds] [Right:< 3 seconds] Toe Nail Assessment Left:  Right: Thick: Yes Yes Discolored: Yes Yes Deformed: Yes Yes Improper Length and Hygiene: Yes Yes Notes L leg non-compressible Right leg unable to do due to pain ABIs done in hospital and they were good R- 0.88, L- 1.2 [name], [name] ([id number]) Electronic Signature(s) Signed: 11/18/2016 5:33:36 PM By: Alric Quan Entered By: Alric Quan on 11/18/2016 10:51:27 [name], [name] ([id number]) -------------------------------------------------------------------------------- Multi Wound Chart Details Patient Name: [name]. Date of Service: 11/18/2016 9:45 AM Medical Record Number: [id number] Patient Account Number: [id number] Date of Birth/Sex: Apr 24, 1956 (60 y.o. Female) Treating RN: Carolyne Fiscal, Debi Primary Care Physician: PATIENT, NO Other Clinician: Referring Physician: Aldine Contes Treating Physician/Extender:  Britto, Errol Weeks in Treatment: 0 Vital Signs Height(in): 63 Pulse(bpm): 80 Weight(lbs): 214.7 Blood Pressure 145/93 (mmHg): Body Mass Index(BMI): 38 Temperature(F): 98.1 Respiratory Rate 18 (breaths/min): Photos: [1:No Photos] [N/A:N/A] Wound Location: [1:Right Lower Leg - Anterior N/A] Wounding Event: [1:Gradually Appeared] [N/A:N/A] Primary Etiology: [1:Venous Leg Ulcer] [N/A:N/A] Comorbid History: [1:Chronic Obstructive Pulmonary Disease (COPD), Hypertension, Rheumatoid Arthritis] [N/A:N/A] Date Acquired: [1:05/19/2016] [N/A:N/A] Weeks of Treatment: [1:0] [N/A:N/A] Wound Status: [1:Open] [N/A:N/A] Measurements L x W x D 5.3x4.3x0.1 [N/A:N/A] (cm) Area (cm) : [1:17.899] [N/A:N/A] Volume (cm) : [1:1.79] [N/A:N/A] Classification: [1:Partial Thickness] [N/A:N/A] Exudate Amount: [1:Large] [N/A:N/A] Exudate Type: [1:Purulent] [N/A:N/A] Exudate Color: [1:yellow, brown, green] [N/A:N/A] Wound Margin: [1:Distinct, outline attached N/A] Granulation Amount: [1:None Present (0%)] [N/A:N/A] Necrotic Amount: [1:Large (67-100%)]  [N/A:N/A] Exposed Structures: [1:Fascia: No Fat: No Tendon: No Muscle: No Joint: No Bone: No Limited to Skin Breakdown] [N/A:N/A] Epithelialization: None N/A N/A Periwound Skin Texture: Edema: Yes N/A N/A Periwound Skin Moist: Yes N/A N/A Moisture: Periwound Skin Color: Erythema: Yes N/A N/A Erythema Location: Circumferential N/A N/A Temperature: No Abnormality N/A N/A Tenderness on Yes N/A N/A Palpation: Wound Preparation: Ulcer Cleansing: N/A N/A Rinsed/Irrigated with Saline, Other: soap and water Topical Anesthetic Applied: Other: lidocaine 4% Treatment Notes Electronic Signature(s) Signed: 11/18/2016 5:33:36 PM By: Alric Quan Entered By: Alric Quan on 11/18/2016 10:53:57 [name], [name] ([id number]) -------------------------------------------------------------------------------- [place] Details Patient Name: [name], [name]. Date of Service: 11/18/2016 9:45 AM Medical Record Number: [id number] Patient Account Number: [id number] Date of Birth/Sex: 04-02-1956 (60 y.o. Female) Treating RN: Carolyne Fiscal, Debi Primary Care Physician: PATIENT, NO Other Clinician: Referring Physician: Aldine Contes Treating Physician/Extender: Frann Rider in Treatment: 0 Active Inactive Abuse / Safety / Falls / Self Care Management Nursing Diagnoses: Potential for falls Goals: Patient will remain injury free Date Initiated: 11/18/2016 Goal Status: Active Interventions: Assess fall risk on admission and as needed Assess self care needs on admission and as needed Notes: Nutrition Nursing Diagnoses: Imbalanced nutrition Goals: Patient/caregiver agrees to and verbalizes understanding of need to use nutritional supplements and/or vitamins as prescribed Date Initiated: 11/18/2016 Goal Status: Active Interventions: Assess patient nutrition upon admission and as needed per policy Notes: Orientation to the Wound Care Program Nursing  Diagnoses: Knowledge deficit related to the wound healing center program Goals: [name], TROLINGER ([id number]) Patient/caregiver will verbalize understanding of the [facility] Program Date Initiated: 11/18/2016 Goal Status: Active Interventions: Provide education on orientation to the wound center Notes: Pain, Acute or Chronic Nursing Diagnoses: Pain, acute or chronic: actual or potential Potential alteration in comfort, pain Goals: Patient will verbalize adequate pain control and receive pain control interventions during procedures as needed Date Initiated: 11/18/2016 Goal Status: Active Patient/caregiver will verbalize adequate pain control between visits Date Initiated: 11/18/2016 Goal Status: Active Patient/caregiver will verbalize comfort level met Date Initiated: 11/18/2016 Goal Status: Active Interventions: Assess comfort goal upon admission Complete pain assessment as per visit requirements Notes: Soft Tissue Infection Nursing Diagnoses: Impaired tissue integrity Knowledge deficit related to disease process and management Knowledge deficit related to home infection control: handwashing, handling of soiled dressings, supply storage Goals: Patient/caregiver will verbalize understanding of or measures to prevent infection and contamination in the home setting Date Initiated: 11/18/2016 Goal Status: Active Patient's soft tissue infection will resolve [name], [name] ([id number]) Date Initiated: 11/18/2016 Goal Status: Active Interventions: Assess signs and symptoms of infection every visit Provide education on infection Notes: Wound/Skin Impairment Nursing Diagnoses: Impaired tissue integrity Goals: Ulcer/skin breakdown will have a volume reduction of  30% by week 4 Date Initiated: 11/18/2016 Goal Status: Active Ulcer/skin breakdown will have a volume reduction of 50% by week 8 Date Initiated: 11/18/2016 Goal Status: Active Ulcer/skin breakdown will have  a volume reduction of 80% by week 12 Date Initiated: 11/18/2016 Goal Status: Active Interventions: Assess patient/caregiver ability to perform ulcer/skin care regimen upon admission and as needed Assess ulceration(s) every visit Provide education on smoking Notes: Electronic Signature(s) Signed: 11/18/2016 5:33:36 PM By: Alric Quan Entered By: Alric Quan on 11/18/2016 10:53:41 [place], [name] ([id number]) -------------------------------------------------------------------------------- Pain Assessment Details Patient Name: [name]. Date of Service: 11/18/2016 9:45 AM Medical Record Number: [id number] Patient Account Number: [id number] Date of Birth/Sex: June 10, 1956 (60 y.o. Female) Treating RN: Carolyne Fiscal, Debi Primary Care Physician: PATIENT, NO Other Clinician: Referring Physician: Aldine Contes Treating Physician/Extender: Frann Rider in Treatment: 0 Active Problems Location of Pain Severity and Description of Pain Patient Has Paino Yes Site Locations Pain Location: Pain in Ulcers With Dressing Change: Yes Duration of the Pain. Constant / Intermittento Constant Rate the pain. Current Pain Level: 8 Worst Pain Level: 10 Least Pain Level: 6 Character of Pain Describe the Pain: Burning Pain Management and Medication Current Pain Management: Electronic Signature(s) Signed: 11/18/2016 5:33:36 PM By: Alric Quan Entered By: Alric Quan on 11/18/2016 10:07:45 [place]al, [name] ([id number]) -------------------------------------------------------------------------------- Patient/Caregiver Education Details Patient Name: [name]. Date of Service: 11/18/2016 9:45 AM Medical Record Number: [id number] Patient Account Number: [id number] Date of Birth/Gender: 12-Jul-1956 (60 y.o. Female) Treating RN: Carolyne Fiscal, Debi Primary Care Physician: PATIENT, NO Other Clinician: Referring Physician: Aldine Contes Treating Physician/Extender:  Frann Rider in Treatment: 0 Education Assessment Education Provided To: Patient Education Topics Provided Infection: Handouts: Infection Prevention and Management Methods: Explain/Verbal Responses: State content correctly Smoking and Wound Healing: Handouts: Smoking and Wound Healing Methods: Explain/Verbal Responses: State content correctly Welcome To [facility]: Handouts: Welcome To [facility] Methods: Explain/Verbal Responses: State content correctly Wound/Skin Impairment: Handouts: Other: keep wrap clean and dry Methods: Demonstration, Explain/Verbal Responses: State content correctly Electronic Signature(s) Signed: 11/18/2016 5:33:36 PM By: Alric Quan Entered By: Alric Quan on 11/18/2016 11:12:36 [place], [name] ([id number]) -------------------------------------------------------------------------------- Wound Assessment Details Patient Name: [name]. Date of Service: 11/18/2016 9:45 AM Medical Record Number: [id number] Patient Account Number: [id number] Date of Birth/Sex: 01-Jul-1956 (59 y.o. Female) Treating RN: Carolyne Fiscal, Debi Primary Care Physician: PATIENT, NO Other Clinician: Referring Physician: Aldine Contes Treating Physician/Extender: Frann Rider in Treatment: 0 Wound Status Wound Number: 1 Primary Venous Leg Ulcer Etiology: Wound Location: Right Lower Leg - Anterior Wound Open Wounding Event: Gradually Appeared Status: Date Acquired: 05/19/2016 Comorbid Chronic Obstructive Pulmonary Weeks Of Treatment: 0 History: Disease (COPD), Hypertension, Clustered Wound: No Rheumatoid Arthritis Photos Photo Uploaded By: Alric Quan on 11/18/2016 11:42:22 Wound Measurements Length: (cm) 5.3 Width: (cm) 4.3 Depth: (cm) 0.1 Area: (cm) 17.899 Volume: (cm) 1.79 % Reduction in Area: % Reduction in Volume: Epithelialization: None Tunneling: No Undermining: No Wound  Description Classification: Partial Thickness Wound Margin: Distinct, outline attached Exudate Amount: Large Exudate Type: Purulent Exudate Color: yellow, brown, green Foul Odor After Cleansing: No Wound Bed Granulation Amount: None Present (0%) Exposed Structure Necrotic Amount: Large (67-100%) Fascia Exposed: No Necrotic Quality: Adherent Slough Fat Layer Exposed: No Tendon Exposed: No Amorin, Mykelti K. ([id number]) Muscle Exposed: No Joint Exposed: No Bone Exposed: No Limited to Skin Breakdown Periwound Skin Texture Texture Color No Abnormalities Noted: No No Abnormalities Noted: No Localized Edema: Yes Erythema: Yes Erythema Location: Circumferential Moisture  No Abnormalities Noted: No Temperature / Pain Moist: Yes Temperature: No Abnormality Tenderness on Palpation: Yes Wound Preparation Ulcer Cleansing: Rinsed/Irrigated with Saline, Other: soap and water, Topical Anesthetic Applied: Other: lidocaine 4%, Treatment Notes Wound #1 (Right, Anterior Lower Leg) 1. Cleansed with: Clean wound with Normal Saline Cleanse wound with antibacterial soap and water 2. Anesthetic Topical Lidocaine 4% cream to wound bed prior to debridement 5. Secondary Dressing Applied ABD Pad Dry Gauze 7. Secured with Tape 3 Layer Compression System - Right Lower Extremity Notes Exufiber Ag, unna to anchor, xtrasorb, Teacher, early years/pre) Signed: 11/18/2016 5:33:36 PM By: Alric Quan Entered By: Alric Quan on 11/18/2016 10:36:38 Sauber, [name] ([id number]) -------------------------------------------------------------------------------- Vitals Details Patient Name: [name]. Date of Service: 11/18/2016 9:45 AM Medical Record Number: [id number] Patient Account Number: [id number] Date of Birth/Sex: June 04, 1956 (60 y.o. Female) Treating RN: Carolyne Fiscal, Debi Primary Care Physician: PATIENT, NO Other Clinician: Referring Physician: Aldine Contes Treating  Physician/Extender: Frann Rider in Treatment: 0 Vital Signs Time Taken: 10:07 Temperature (F): 98.1 Height (in): 63 Pulse (bpm): 80 Source: Stated Respiratory Rate (breaths/min): 18 Weight (lbs): 214.7 Blood Pressure (mmHg): 145/93 Source: Measured Reference Range: 80 - 120 mg / dl Body Mass Index (BMI): 38 Electronic Signature(s) Signed: 11/18/2016 5:33:36 PM By: Alric Quan Entered By: Alric Quan on 11/18/2016 10:09:31

## 2016-11-19 NOTE — Progress Notes (Signed)
[name], [name] ([id number]) Visit Report for 11/18/2016 Abuse/Suicide Risk Screen Details Patient Name: [name], [name] 11/18/2016 9:45 Date of Service: AM Medical Record [id number] Number: Patient Account Number: [id number] 1956/04/05 (60 y.o. Treating RN: Phillis Haggis Date of Birth/Sex: Female) Other Clinician: Primary Care Physician: PATIENT, NO Treating Britto, Errol Referring Physician: Earl Lagos Physician/Extender: Weeks in Treatment: 0 Abuse/Suicide Risk Screen Items Answer ABUSE/SUICIDE RISK SCREEN: Has anyone close to you tried to hurt or harm you recentlyo No Do you feel uncomfortable with anyone in your familyo No Has anyone forced you do things that you didnot want to doo No Do you have any thoughts of harming yourselfo No Patient displays signs or symptoms of abuse and/or neglect. No Electronic Signature(s) Signed: 11/18/2016 5:33:36 PM By: Alejandro Mulling Entered By: Alejandro Mulling on 11/18/2016 10:18:02 Rohwer, [name] ([id number]) -------------------------------------------------------------------------------- Activities of Daily Living Details Patient Name: [name], [name] 11/18/2016 9:45 Date of Service: AM Medical Record [id number] Number: Patient Account Number: [id number] [birth year] (60 y.o. Treating RN: Phillis Haggis Date of Birth/Sex: Female) Other Clinician: Primary Care Physician: PATIENT, NO Treating Britto, Errol Referring Physician: Earl Lagos Physician/Extender: Weeks in Treatment: 0 Activities of Daily Living Items Answer Activities of Daily Living (Please select one for each item) Drive Automobile Completely Able Take Medications Completely Able Use Telephone Completely Able Care for Appearance Completely Able Use Toilet Completely Able Bath / Shower Completely Able Dress Self Completely Able Feed Self Completely Able Walk Completely Able Get In / Out Bed Completely Able Housework Completely Able Prepare  Meals Completely Able Handle Money Completely Able Shop for Self Completely Able Electronic Signature(s) Signed: 11/18/2016 5:33:36 PM By: Alejandro Mulling Entered By: Alejandro Mulling on 11/18/2016 10:18:22 Szafran, [name] ([id number]) -------------------------------------------------------------------------------- Education Assessment Details Patient Name: [name], [name] 11/18/2016 9:45 Date of Service: AM Medical Record [id number] Number: Patient Account Number: [id number] 01-01-1956 (60 y.o. Treating RN: Phillis Haggis Date of Birth/Sex: Female) Other Clinician: Primary Care Physician: PATIENT, NO Treating Britto, Errol Referring Physician: Earl Lagos Physician/Extender: Tania Ade in Treatment: 0 Primary Learner Assessed: Patient Learning Preferences/Education Level/Primary Language Learning Preference: Explanation, Printed Material Highest Education Level: College or Above Preferred Language: Economist Assessment/Beliefs Language Barrier: No Translator Needed: No Memory Deficit: No Emotional Barrier: No Cultural/Religious Beliefs Affecting Medical No Care: Physical Barrier Assessment Impaired Vision: No Impaired Hearing: No Decreased Hand dexterity: No Knowledge/Comprehension Assessment Knowledge Level: High Comprehension Level: High Ability to understand written High instructions: Ability to understand verbal High instructions: Motivation Assessment Anxiety Level: Calm Cooperation: Cooperative Education Importance: Acknowledges Need Interest in Health Problems: Asks Questions Perception: Coherent Willingness to Engage in Self- High Management Activities: Readiness to Engage in Self- High Management Activities: JAIDY, COTTAM ([id number]) Electronic Signature(s) Signed: 11/18/2016 5:33:36 PM By: Alejandro Mulling Entered By: Alejandro Mulling on 11/18/2016 10:18:41 Teasdale, [name]  ([id number]) -------------------------------------------------------------------------------- Fall Risk Assessment Details Patient Name: [name], [name] 11/18/2016 9:45 Date of Service: AM Medical Record [id number] Number: Patient Account Number: [id number] 01-06-1956 (60 y.o. Treating RN: Phillis Haggis Date of Birth/Sex: Female) Other Clinician: Primary Care Physician: PATIENT, NO Treating Britto, Errol Referring Physician: Earl Lagos Physician/Extender: Weeks in Treatment: 0 Fall Risk Assessment Items Have you had 2 or more falls in the last 12 monthso 0 Yes Have you had any fall that resulted in injury in the last 12 monthso 0 Yes FALL RISK ASSESSMENT: History of falling - immediate or within 3 months 25 Yes Secondary diagnosis 15 Yes Ambulatory aid None/bed rest/wheelchair/nurse 0 No Crutches/cane/walker 15 Yes  Furniture 0 No IV Access/Saline Lock 0 No Gait/Training Normal/bed rest/immobile 0 No Weak 0 No Impaired 20 Yes Mental Status Oriented to own ability 0 Yes Electronic Signature(s) Signed: 11/18/2016 5:33:36 PM By: Alejandro Mulling Entered By: Alejandro Mulling on 11/18/2016 10:19:12 Yetman, [name] ([id number]) -------------------------------------------------------------------------------- Foot Assessment Details Patient Name: [name], [name] 11/18/2016 9:45 Date of Service: AM Medical Record [id number] Number: Patient Account Number: [id number] 12/05/1956 (60 y.o. Treating RN: Phillis Haggis Date of Birth/Sex: Female) Other Clinician: Primary Care Physician: PATIENT, NO Treating Britto, Errol Referring Physician: Earl Lagos Physician/Extender: Weeks in Treatment: 0 Foot Assessment Items Site Locations + = Sensation present, - = Sensation absent, C = Callus, U = Ulcer R = Redness, W = Warmth, M = Maceration, PU = Pre-ulcerative lesion F = Fissure, S = Swelling, D = Dryness Assessment Right: Left: Other Deformity: No No Prior  Foot Ulcer: No No Prior Amputation: No No Charcot Joint: No No Ambulatory Status: Ambulatory Without Help Gait: Steady Electronic Signature(s) Signed: 11/18/2016 5:33:36 PM By: Alejandro Mulling Entered By: Alejandro Mulling on 11/18/2016 10:23:11 Spink, [name] ([id number]) -------------------------------------------------------------------------------- Nutrition Risk Assessment Details Patient Name: [name], [name] 11/18/2016 9:45 Date of Service: AM Medical Record [id number] Number: Patient Account Number: [id number] 23-Jul-1956 (60 y.o. Treating RN: Phillis Haggis Date of Birth/Sex: Female) Other Clinician: Primary Care Physician: PATIENT, NO Treating Britto, Errol Referring Physician: Earl Lagos Physician/Extender: Weeks in Treatment: 0 Height (in): 63 Weight (lbs): 214.7 Body Mass Index (BMI): 38 Nutrition Risk Assessment Items NUTRITION RISK SCREEN: I have an illness or condition that made me change the kind and/or 2 Yes amount of food I eat I eat fewer than two meals per day 3 Yes I eat few fruits and vegetables, or milk products 0 No I have three or more drinks of beer, liquor or wine almost every day 0 No I have tooth or mouth problems that make it hard for me to eat 0 No I don't always have enough money to buy the food I need 0 No I eat alone most of the time 1 Yes I take three or more different prescribed or over-the-counter drugs a 0 No day Without wanting to, I have lost or gained 10 pounds in the last six 2 Yes months I am not always physically able to shop, cook and/or feed myself 0 No Nutrition Protocols Good Risk Protocol Moderate Risk Protocol Provide education on High Risk Proctocol 0 Electronic Signature(s) Signed: 11/18/2016 5:33:36 PM By: Alejandro Mulling Entered By: Alejandro Mulling on 11/18/2016 10:19:47

## 2016-11-21 ENCOUNTER — Telehealth: Payer: Self-pay | Admitting: General Practice

## 2016-11-21 NOTE — Telephone Encounter (Signed)
APT. REMINDER CALL, LMTCB °

## 2016-11-22 ENCOUNTER — Ambulatory Visit (INDEPENDENT_AMBULATORY_CARE_PROVIDER_SITE_OTHER): Payer: Self-pay | Admitting: Internal Medicine

## 2016-11-22 ENCOUNTER — Encounter: Payer: Self-pay | Admitting: Internal Medicine

## 2016-11-22 ENCOUNTER — Ambulatory Visit: Payer: Self-pay | Admitting: Internal Medicine

## 2016-11-22 ENCOUNTER — Telehealth: Payer: Self-pay | Admitting: Internal Medicine

## 2016-11-22 VITALS — BP 117/73 | HR 78 | Temp 97.9°F | Wt 224.1 lb

## 2016-11-22 DIAGNOSIS — I83008 Varicose veins of unspecified lower extremity with ulcer other part of lower leg: Secondary | ICD-10-CM

## 2016-11-22 DIAGNOSIS — L97809 Non-pressure chronic ulcer of other part of unspecified lower leg with unspecified severity: Principal | ICD-10-CM

## 2016-11-22 DIAGNOSIS — M25532 Pain in left wrist: Secondary | ICD-10-CM

## 2016-11-22 DIAGNOSIS — M25531 Pain in right wrist: Secondary | ICD-10-CM

## 2016-11-22 DIAGNOSIS — E039 Hypothyroidism, unspecified: Secondary | ICD-10-CM

## 2016-11-22 DIAGNOSIS — M069 Rheumatoid arthritis, unspecified: Secondary | ICD-10-CM

## 2016-11-22 DIAGNOSIS — L97818 Non-pressure chronic ulcer of other part of right lower leg with other specified severity: Secondary | ICD-10-CM

## 2016-11-22 DIAGNOSIS — L97919 Non-pressure chronic ulcer of unspecified part of right lower leg with unspecified severity: Secondary | ICD-10-CM

## 2016-11-22 DIAGNOSIS — Z9114 Patient's other noncompliance with medication regimen: Secondary | ICD-10-CM

## 2016-11-22 DIAGNOSIS — I10 Essential (primary) hypertension: Secondary | ICD-10-CM

## 2016-11-22 DIAGNOSIS — I8312 Varicose veins of left lower extremity with inflammation: Secondary | ICD-10-CM

## 2016-11-22 DIAGNOSIS — Z79899 Other long term (current) drug therapy: Secondary | ICD-10-CM

## 2016-11-22 DIAGNOSIS — E038 Other specified hypothyroidism: Secondary | ICD-10-CM

## 2016-11-22 DIAGNOSIS — E063 Autoimmune thyroiditis: Secondary | ICD-10-CM

## 2016-11-22 DIAGNOSIS — L97829 Non-pressure chronic ulcer of other part of left lower leg with unspecified severity: Secondary | ICD-10-CM

## 2016-11-22 DIAGNOSIS — I8311 Varicose veins of right lower extremity with inflammation: Secondary | ICD-10-CM

## 2016-11-22 DIAGNOSIS — I83218 Varicose veins of right lower extremity with both ulcer of other part of lower extremity and inflammation: Secondary | ICD-10-CM

## 2016-11-22 DIAGNOSIS — F17210 Nicotine dependence, cigarettes, uncomplicated: Secondary | ICD-10-CM

## 2016-11-22 MED ORDER — METHOTREXATE (ANTI-RHEUMATIC) 2.5 MG PO TABS: 7.5000 mg | ORAL_TABLET | ORAL | 1 refills | Status: DC

## 2016-11-22 MED ORDER — HYDROCODONE-ACETAMINOPHEN 5-300 MG PO TABS: 1.0000 | ORAL_TABLET | Freq: Three times a day (TID) | ORAL | 0 refills | Status: DC | PRN

## 2016-11-22 MED ORDER — PREDNISONE 20 MG PO TABS: 40.0000 mg | ORAL_TABLET | Freq: Every day | ORAL | 0 refills | Status: DC

## 2016-11-22 MED ORDER — PREDNISONE 20 MG PO TABS: 60.0000 mg | ORAL_TABLET | Freq: Every day | ORAL | 0 refills | Status: AC

## 2016-11-22 MED ORDER — ONDANSETRON 4 MG PO TBDP: 4.0000 mg | ORAL_TABLET | Freq: Once | ORAL | Status: DC

## 2016-11-22 NOTE — Assessment & Plan Note (Signed)
During visit at 117/73 with a pulse of 78. Patient appears to be well-controlled on diet alone. -WIll monitor- -encouraged decreased sodium intake

## 2016-11-22 NOTE — Patient Instructions (Signed)
It was a pleasure seeing you again today!  1. Today we talked about your lower extremity wound. I have a suspicion this is Pyoderma Gangrenosum which is related to your autoimmune diseases. For this I am prescribing you a steroid (Prednisone - you will take 60 mg daily for 1 week, then 40 mg the following week, then we will taper you off). I am also prescribing you a medication to help take the edge of your pain so you can sleep at night.  2. For your Rheumatoid arthritis, we are starting you on Methotrexate. You will take 7.5mg  once a week for this.  3. We also talked about your nausea, I am writing you a prescription for Zofran. This medication can be quite expensive although works well for nausea.  4. Please remember to take a picture of your leg while you are at wound care so that we may follow up its progress! 5. Come back and see [place] in 2 weeks so we can see how are you are doing and repeat your blood work.

## 2016-11-22 NOTE — Telephone Encounter (Signed)
[name] needed clarification of Prednisone directions from today's visit. Informed "Prednisone -  take 60 mg daily for 1 week, then 40 mg the following week, then we will taper you off)." per Dr Lana Fish note.

## 2016-11-22 NOTE — Telephone Encounter (Signed)
That is correct. 1 week of Prednisone 60 mg followed by 1 week of Prednisone 40 mg. Will taper off at her next visit in 2 weeks.

## 2016-11-22 NOTE — Assessment & Plan Note (Signed)
Patient reports she has not seen a rheumatologist in over 5 years and was previously on methotrexate and Enbrel. She reports her rheumatologist for retired and has not established care since that time. Since then her rheumatoid symptoms have progressed and she endorses significant pain in bilateral wrists and development of changes consistent with rheumatoid arthritis. -We'll start patient on methotrexate 7.5 mg once weekly -Have ordered appropriate screening labs including a CBC with differential and CMET -Patient aware that close follow-up is required when initiating methotrexate and agrees to return in 2 weeks for repeat lab work and follow-up

## 2016-11-22 NOTE — Progress Notes (Signed)
   CC: Follow-up evaluation of right leg ulceration.  HPI:  [name] is a very pleasant 60 y.o. female with medical history significant for untreated Hashimoto's hypothyroidism, untreated rheumatoid arthritis, and hypertension who presents for hospital follow-up evaluation of a progressive large right shin ulceration. Patient is well-known to me but her story in brief is approximately 3 months ago she noticed a small "pimple" on her right shin which has quickly progressed and ulcerated. She reports that occasionally drains clear yellow fluid with some blood however overall denies excessive purulent drainage. She was seen in the ED 11/26 and was diagnosed with a venous ulcer and started on doxycycline and clindamycin. She again presented to the ED 11/30 with worsening of her ulceration and surrounding erythema. She was subsequently admitted for evaluation and started on IV vancomycin. She had negative ABIs during admission, no leukocytosis, was afebrile and the wound did not have foul smelling drainage. She was subsequently discharged home with 7 days of Levaquin and close follow-up with wound care. She was seen at wound care this Monday at which time dressing was applied. They have ordered venous studies of her bilateral lower extremities is a feel this is likely a venous ulcer. Her wound has not responded to antibiotic therapy.   Past Medical History:  Diagnosis Date  . Hashimoto's thyroiditis   . HTN (hypertension)   . Hypercholesteremia   . Hypothyroid   . Rheumatoid arthritis(714.0)     Review of Systems:  Review of Systems  Constitutional: Negative for chills and fever.  Respiratory: Negative for shortness of breath and wheezing.   Cardiovascular: Positive for leg swelling. Negative for chest pain, claudication and PND.  Gastrointestinal: Positive for nausea and vomiting. Negative for abdominal pain.  Neurological: Negative for dizziness and headaches.    Physical Exam:  Physical Exam  Constitutional: She is well-developed, well-nourished, and in no distress.  HENT:  Head: Normocephalic and atraumatic.  Mouth/Throat: No oropharyngeal exudate.  Cardiovascular: Normal rate, regular rhythm and normal heart sounds.   Pulmonary/Chest: Breath sounds normal. No respiratory distress. She has no wheezes.  Abdominal: Soft. Bowel sounds are normal. She exhibits no distension.  Skin: Skin is warm and dry. She is not diaphoretic.  RLE with significant dressing in place. Clean and dry. Changes consistent with mild venous insufficiency of LLE. Well perfused.       Vitals:   11/22/16 1101  BP: 117/73  Pulse: 78  Temp: 97.9 F (36.6 C)  TempSrc: Oral  SpO2: 100%  Weight: 224 lb 1.6 oz (101.7 kg)    Assessment & Plan:   See Encounters Tab for problem based charting.  Patient seen with Dr. Rogelia Boga

## 2016-11-22 NOTE — Telephone Encounter (Signed)
Please contact Marylu Lund @ the [employer] [employer].

## 2016-11-22 NOTE — Assessment & Plan Note (Signed)
Patient has diagnosis of Hashimoto's disease and has been noncompliant with her Synthroid for at least 5 years. Her TSH while hospitalized was 99. She was restarted on Synthroid 150 g daily. -WIll follow up TSH in approximately 6-8 weeks

## 2016-11-22 NOTE — Assessment & Plan Note (Addendum)
Patient here with several month history of progressively worsening right lower extremity ulceration. Initially began as a small "bump" however has now progressed to a ulceration measuring approximate 6 x 5 cm. There is associated yellow discoloration of the wound with surrounding necrosis and her wound has not responded to IV vancomycin, or oral Levaquin therapy. The patient was afebrile and without leukocytosis and lower extremity imaging was without evidence for osteomyelitis. This coupled with her untreated autoimmune disorders make me suspicious for pyoderma gangrenosum. -We'll follow up results of venous studies although patient has several large lower extremity varicose veins and some changes consistent with chronic venous insufficiency, a positive result and this test may not exclude other causes. -Systemic steroid therapy with oral prednisone 60 mg 7 days followed by oral prednisone 40 mg 7 days followed by reevaluation with probable subsequent taper. -Patient has completed all courses of prescribed antibiotics at this time. Do not believe patient needs further antibiotic therapy. -Encourage follow-up with wound care

## 2016-11-23 LAB — CBC WITH DIFFERENTIAL/PLATELET
BASOS: 0 %
Basophils Absolute: 0 10*3/uL (ref 0.0–0.2)
EOS (ABSOLUTE): 0.3 10*3/uL (ref 0.0–0.4)
EOS: 3 %
HEMATOCRIT: 39.6 % (ref 34.0–46.6)
HEMOGLOBIN: 13.1 g/dL (ref 11.1–15.9)
IMMATURE GRANS (ABS): 0.1 10*3/uL (ref 0.0–0.1)
IMMATURE GRANULOCYTES: 1 %
LYMPHS: 16 %
Lymphocytes Absolute: 1.4 10*3/uL (ref 0.7–3.1)
MCH: 30.5 pg (ref 26.6–33.0)
MCHC: 33.1 g/dL (ref 31.5–35.7)
MCV: 92 fL (ref 79–97)
Monocytes Absolute: 0.8 10*3/uL (ref 0.1–0.9)
Monocytes: 9 %
NEUTROS PCT: 71 %
Neutrophils Absolute: 6.4 10*3/uL (ref 1.4–7.0)
Platelets: 167 10*3/uL (ref 150–379)
RBC: 4.29 x10E6/uL (ref 3.77–5.28)
RDW: 15.2 % (ref 12.3–15.4)
WBC: 9 10*3/uL (ref 3.4–10.8)

## 2016-11-23 LAB — COMPREHENSIVE METABOLIC PANEL
A/G RATIO: 1.6 (ref 1.2–2.2)
ALBUMIN: 4.2 g/dL (ref 3.5–5.5)
ALT: 7 IU/L (ref 0–32)
AST: 11 IU/L (ref 0–40)
Alkaline Phosphatase: 100 IU/L (ref 39–117)
BUN / CREAT RATIO: 19 (ref 9–23)
BUN: 16 mg/dL (ref 6–24)
Bilirubin Total: 0.4 mg/dL (ref 0.0–1.2)
CALCIUM: 9.2 mg/dL (ref 8.7–10.2)
CO2: 25 mmol/L (ref 18–29)
Chloride: 96 mmol/L (ref 96–106)
Creatinine, Ser: 0.83 mg/dL (ref 0.57–1.00)
GFR, EST AFRICAN AMERICAN: 89 mL/min/{1.73_m2} (ref >59)
GFR, EST NON AFRICAN AMERICAN: 77 mL/min/{1.73_m2} (ref >59)
GLOBULIN, TOTAL: 2.7 g/dL (ref 1.5–4.5)
Glucose: 82 mg/dL (ref 65–99)
POTASSIUM: 4.3 mmol/L (ref 3.5–5.2)
Sodium: 138 mmol/L (ref 134–144)
TOTAL PROTEIN: 6.9 g/dL (ref 6.0–8.5)

## 2016-11-25 ENCOUNTER — Encounter: Payer: Self-pay | Admitting: Surgery

## 2016-11-27 NOTE — Progress Notes (Signed)
Marilyn, [name] ([id number]) Visit Report for 11/25/2016 Arrival Information Details Patient Name: [name], [name] 11/25/2016 12:45 Date of Service: PM Medical Record [id number] Number: Patient Account Number: [id number] 10-10-1956 (60 y.o. Treating RN: Ahmed Prima Date of Birth/Sex: Female) Other Clinician: Primary Care Physician: PATIENT, NO Treating Britto, Errol Referring Physician: Aldine Contes Physician/Extender: Weeks in Treatment: 1 Visit Information History Since Last Visit All ordered tests and consults were completed: No Patient Arrived: Ambulatory Added or deleted any medications: No Arrival Time: 12:49 Any new allergies or adverse reactions: No Accompanied By: self Had a fall or experienced change in No Transfer Assistance: None activities of daily living that may affect Patient Identification Verified: Yes risk of falls: Secondary Verification Process Yes Signs or symptoms of abuse/neglect since last No Completed: visito Patient Requires Transmission- No Hospitalized since last visit: No Based Precautions: Has Dressing in Place as Prescribed: Yes Patient Has Alerts: Yes Has Compression in Place as Prescribed: Yes Patient Alerts: L Leg ABI 1.2 Pain Present Now: Yes R Leg ABI 0.88 ABIs done in [place] Signature(s) Signed: 11/26/2016 5:23:32 PM By: Alric Quan Entered By: Alric Quan on 11/25/2016 12:50:55 Picotte, [name] ([id number]) -------------------------------------------------------------------------------- Encounter Discharge Information Details Patient Name: Marilyn, [name] 11/25/2016 12:45 Date of Service: PM Medical Record [id number] Number: Patient Account Number: [id number] 13-Jul-1956 (59 y.o. Treating RN: Ahmed Prima Date of Birth/Sex: Female) Other Clinician: Primary Care Physician: PATIENT, NO Treating Britto, Errol Referring Physician: Aldine Contes Physician/Extender: Weeks in  Treatment: 1 Encounter Discharge Information Items Discharge Pain Level: 6 Discharge Condition: Stable Ambulatory Status: Ambulatory Discharge Destination: Home Transportation: Private Auto Accompanied By: self Schedule Follow-up Appointment: Yes Medication Reconciliation completed and provided to Patient/Care Yes Naamah Boggess: Provided on Clinical Summary of Care: 11/25/2016 Form Type Recipient Paper Patient JP Electronic Signature(s) Signed: 11/25/2016 1:30:43 PM By: Ruthine Dose Entered By: Ruthine Dose on 11/25/2016 13:30:43 Aitken, [name] ([id number]) -------------------------------------------------------------------------------- Lower Extremity Assessment Details Patient Name: [name], [name] 11/25/2016 12:45 Date of Service: PM Medical Record [id number] Number: Patient Account Number: [id number] Mar 23, 1956 (60 y.o. Treating RN: Ahmed Prima Date of Birth/Sex: Female) Other Clinician: Primary Care Physician: PATIENT, NO Treating Britto, Errol Referring Physician: Aldine Contes Physician/Extender: Weeks in Treatment: 1 Edema Assessment Assessed: [Left: No] [Right: No] E[Left: dema] [Right: :] Calf Left: Right: Point of Measurement: 32 cm From Medial Instep cm 46.8 cm Ankle Left: Right: Point of Measurement: 9 cm From Medial Instep cm 27.2 cm Vascular Assessment Pulses: Posterior Tibial Dorsalis Pedis Palpable: [Right:Yes] Extremity colors, hair growth, and conditions: Extremity Color: [Right:Mottled] Temperature of Extremity: [Right:Warm] Capillary Refill: [Right:< 3 seconds] Toe Nail Assessment Left: Right: Thick: Yes Discolored: Yes Deformed: Yes Improper Length and Hygiene: Yes Electronic Signature(s) Signed: 11/26/2016 5:23:32 PM By: Alric Quan Entered By: Alric Quan on 11/25/2016 12:58:59 Caporale, [name] ([id number]) Zenker, [name]  ([id number]) -------------------------------------------------------------------------------- Multi Wound Chart Details Patient Name: Marilyn, [name] 11/25/2016 12:45 Date of Service: PM Medical Record [id number] Number: Patient Account Number: [id number] [birth year] (60 y.o. Treating RN: Ahmed Prima Date of Birth/Sex: Female) Other Clinician: Primary Care Physician: PATIENT, NO Treating Britto, Errol Referring Physician: Aldine Contes Physician/Extender: Weeks in Treatment: 1 Vital Signs Height(in): 63 Pulse(bpm): 82 Weight(lbs): 214.7 Blood Pressure 153/78 (mmHg): Body Mass Index(BMI): 38 Temperature(F): 98.1 Respiratory Rate 20 (breaths/min): Photos: [1:No Photos] [N/A:N/A] Wound Location: [1:Right Lower Leg - Anterior N/A] Wounding Event: [1:Gradually Appeared] [N/A:N/A] Primary Etiology: [1:Venous Leg Ulcer] [N/A:N/A] Comorbid History: [1:Chronic Obstructive Pulmonary Disease (COPD), Hypertension, Rheumatoid Arthritis] [N/A:N/A] Date Acquired: [1:05/19/2016] [  N/A:N/A] Weeks of Treatment: [1:1] [N/A:N/A] Wound Status: [1:Open] [N/A:N/A] Measurements L x W x D 5x3.9x0.1 [N/A:N/A] (cm) Area (cm) : [1:15.315] [N/A:N/A] Volume (cm) : [1:1.532] [N/A:N/A] % Reduction in Area: [1:14.40%] [N/A:N/A] % Reduction in Volume: 14.40% [N/A:N/A] Classification: [1:Partial Thickness] [N/A:N/A] Exudate Amount: [1:Large] [N/A:N/A] Exudate Type: [1:Purulent] [N/A:N/A] Exudate Color: [1:yellow, brown, green] [N/A:N/A] Wound Margin: [1:Distinct, outline attached N/A] Granulation Amount: [1:None Present (0%)] [N/A:N/A] Necrotic Amount: [1:Large (67-100%)] [N/A:N/A] Exposed Structures: [1:Fascia: No Fat: No Tendon: No Muscle: No] [N/A:N/A] Joint: No Bone: No Limited to Skin Breakdown Epithelialization: None N/A N/A Periwound Skin Texture: Edema: Yes N/A N/A Periwound Skin Moist: Yes N/A N/A Moisture: Periwound Skin Color: Erythema: Yes N/A N/A Erythema Location:  Circumferential N/A N/A Temperature: No Abnormality N/A N/A Tenderness on Yes N/A N/A Palpation: Wound Preparation: Ulcer Cleansing: N/A N/A Rinsed/Irrigated with Saline, Other: soap and water Topical Anesthetic Applied: Other: lidocaine 4% Treatment Notes Electronic Signature(s) Signed: 11/26/2016 5:23:32 PM By: Alric Quan Entered By: Alric Quan on 11/25/2016 13:06:48 Durfee, [name] ([id number]) -------------------------------------------------------------------------------- [place] Details Patient Name: SIRA, ADSIT 11/25/2016 12:45 Date of Service: PM Medical Record [id number] Number: Patient Account Number: [id number] 1956/12/01 (60 y.o. Treating RN: Ahmed Prima Date of Birth/Sex: Female) Other Clinician: Primary Care Physician: PATIENT, NO Treating Britto, Errol Referring Physician: Aldine Contes Physician/Extender: Weeks in Treatment: 1 Active Inactive Abuse / Safety / Falls / Self Care Management Nursing Diagnoses: Potential for falls Goals: Patient will remain injury free Date Initiated: 11/18/2016 Goal Status: Active Interventions: Assess fall risk on admission and as needed Assess self care needs on admission and as needed Notes: Nutrition Nursing Diagnoses: Imbalanced nutrition Goals: Patient/caregiver agrees to and verbalizes understanding of need to use nutritional supplements and/or vitamins as prescribed Date Initiated: 11/18/2016 Goal Status: Active Interventions: Assess patient nutrition upon admission and as needed per policy Notes: Orientation to the Wound Care Program Nursing Diagnoses: Knowledge deficit related to the wound healing center program MARJI, KUEHNEL ([id number]) Goals: Patient/caregiver will verbalize understanding of the [place] Date Initiated: 11/18/2016 Goal Status: Active Interventions: Provide education on orientation to the wound  center Notes: Pain, Acute or Chronic Nursing Diagnoses: Pain, acute or chronic: actual or potential Potential alteration in comfort, pain Goals: Patient will verbalize adequate pain control and receive pain control interventions during procedures as needed Date Initiated: 11/18/2016 Goal Status: Active Patient/caregiver will verbalize adequate pain control between visits Date Initiated: 11/18/2016 Goal Status: Active Patient/caregiver will verbalize comfort level met Date Initiated: 11/18/2016 Goal Status: Active Interventions: Assess comfort goal upon admission Complete pain assessment as per visit requirements Notes: Soft Tissue Infection Nursing Diagnoses: Impaired tissue integrity Knowledge deficit related to disease process and management Knowledge deficit related to home infection control: handwashing, handling of soiled dressings, supply storage Goals: Patient/caregiver will verbalize understanding of or measures to prevent infection and contamination in the home setting Date Initiated: 11/18/2016 IBTISAM, BENGE ([id number]) Goal Status: Active Patient's soft tissue infection will resolve Date Initiated: 11/18/2016 Goal Status: Active Interventions: Assess signs and symptoms of infection every visit Provide education on infection Treatment Activities: Education provided on Infection : 11/18/2016 Notes: Wound/Skin Impairment Nursing Diagnoses: Impaired tissue integrity Goals: Ulcer/skin breakdown will have a volume reduction of 30% by week 4 Date Initiated: 11/18/2016 Goal Status: Active Ulcer/skin breakdown will have a volume reduction of 50% by week 8 Date Initiated: 11/18/2016 Goal Status: Active Ulcer/skin breakdown will have a volume reduction of 80% by week 12 Date Initiated: 11/18/2016 Goal Status:  Active Interventions: Assess patient/caregiver ability to perform ulcer/skin care regimen upon admission and as needed Assess ulceration(s) every  visit Provide education on smoking Notes: Electronic Signature(s) Signed: 11/26/2016 5:23:32 PM By: Alric Quan Entered By: Alric Quan on 11/25/2016 13:06:43 Hizer, [name] ([id number]) -------------------------------------------------------------------------------- Pain Assessment Details Patient Name: NAZIAH, PORTEE 11/25/2016 12:45 Date of Service: PM Medical Record [id number] Number: Patient Account Number: [id number] [birth year] (60 y.o. Treating RN: Ahmed Prima Date of Birth/Sex: Female) Other Clinician: Primary Care Physician: PATIENT, NO Treating Britto, Errol Referring Physician: Aldine Contes Physician/Extender: Weeks in Treatment: 1 Active Problems Location of Pain Severity and Description of Pain Patient Has Paino Yes Site Locations Pain Location: Pain in Ulcers With Dressing Change: Yes Duration of the Pain. Constant / Intermittento Constant Rate the pain. Current Pain Level: 7 Worst Pain Level: 10 Least Pain Level: 5 Character of Pain Describe the Pain: Aching Pain Management and Medication Current Pain Management: Electronic Signature(s) Signed: 11/26/2016 5:23:32 PM By: Alric Quan Entered By: Alric Quan on 11/25/2016 12:51:12 [place], [name] ([id number]) -------------------------------------------------------------------------------- Patient/Caregiver Education Details Patient Name: ALVERIA, MCGLAUGHLIN 11/25/2016 12:45 Date of Service: PM Medical Record [id number] Number: Patient Account Number: [id number] November 03, 1956 (60 y.o. Treating RN: Ahmed Prima Date of Birth/Gender: Female) Other Clinician: Primary Care Physician: PATIENT, NO Treating Britto, Errol Referring Physician: Aldine Contes Physician/Extender: [place] in Treatment: 1 Education Assessment Education Provided To: Patient Education Topics Provided Wound/Skin Impairment: Handouts: Other: change dressing as ordered and do not get dressing  wet Methods: Demonstration, Explain/Verbal Responses: State content correctly Electronic Signature(s) Signed: 11/26/2016 5:23:32 PM By: Alric Quan Entered By: Alric Quan on 11/25/2016 13:11:00 Aaronson, [name] ([id number]) -------------------------------------------------------------------------------- Wound Assessment Details Patient Name: AMMARA, RAJ 11/25/2016 12:45 Date of Service: PM Medical Record [id number] Number: Patient Account Number: [id number] [birth year] (60 y.o. Treating RN: Ahmed Prima Date of Birth/Sex: Female) Other Clinician: Primary Care Physician: PATIENT, NO Treating Britto, Errol Referring Physician: Aldine Contes Physician/Extender: Weeks in Treatment: 1 Wound Status Wound Number: 1 Primary Venous Leg Ulcer Etiology: Wound Location: Right Lower Leg - Anterior Wound Open Wounding Event: Gradually Appeared Status: Date Acquired: 05/19/2016 Comorbid Chronic Obstructive Pulmonary Weeks Of Treatment: 1 History: Disease (COPD), Hypertension, Clustered Wound: No Rheumatoid Arthritis Photos Photo Uploaded By: Alric Quan on 11/25/2016 13:09:09 Wound Measurements Length: (cm) 5 Width: (cm) 3.9 Depth: (cm) 0.1 Area: (cm) 15.315 Volume: (cm) 1.532 % Reduction in Area: 14.4% % Reduction in Volume: 14.4% Epithelialization: None Tunneling: No Undermining: No Wound Description Classification: Partial Thickness Foul Odor Afte Wound Margin: Distinct, outline attached Exudate Amount: Large Exudate Type: Purulent Exudate Color: yellow, brown, green r Cleansing: No Wound Bed Granulation Amount: None Present (0%) Exposed Structure Necrotic Amount: Large (67-100%) Fascia Exposed: No Canan, Amore K. ([id number]) Necrotic Quality: Adherent Slough Fat Layer Exposed: No Tendon Exposed: No Muscle Exposed: No Joint Exposed: No Bone Exposed: No Limited to Skin Breakdown Periwound Skin Texture Texture Color No  Abnormalities Noted: No No Abnormalities Noted: No Localized Edema: Yes Erythema: Yes Erythema Location: Circumferential Moisture No Abnormalities Noted: No Temperature / Pain Moist: Yes Temperature: No Abnormality Tenderness on Palpation: Yes Wound Preparation Ulcer Cleansing: Rinsed/Irrigated with Saline, Other: soap and water, Topical Anesthetic Applied: Other: lidocaine 4%, Treatment Notes Wound #1 (Right, Anterior Lower Leg) 1. Cleansed with: Clean wound with Normal Saline Cleanse wound with antibacterial soap and water 2. Anesthetic Topical Lidocaine 4% cream to wound bed prior to debridement 4. Dressing Applied: Aquacel Ag 5. Secondary Dressing Applied ABD Pad Dry Gauze 7. Secured  with 3 Layer Compression System - Right Lower Extremity Notes unna to anchor, xtrasorb, charcoal Electronic Signature(s) Signed: 11/26/2016 5:23:32 PM By: Alric Quan Entered By: Alric Quan on 11/25/2016 13:05:48 Ruff, [name] ([id number]) -------------------------------------------------------------------------------- Vitals Details Patient Name: OMAIRA, MELLEN 11/25/2016 12:45 Date of Service: PM Medical Record [id number] Number: Patient Account Number: [id number] January 19, 1956 (60 y.o. Treating RN: Ahmed Prima Date of Birth/Sex: Female) Other Clinician: Primary Care Physician: PATIENT, NO Treating Britto, Errol Referring Physician: Aldine Contes Physician/Extender: Weeks in Treatment: 1 Vital Signs Time Taken: 12:51 Temperature (F): 98.1 Height (in): 63 Pulse (bpm): 82 Weight (lbs): 214.7 Respiratory Rate (breaths/min): 20 Body Mass Index (BMI): 38 Blood Pressure (mmHg): 153/78 Reference Range: 80 - 120 mg / dl Electronic Signature(s) Signed: 11/26/2016 5:23:32 PM By: Alric Quan Entered By: Alric Quan on 11/25/2016 12:52:03

## 2016-11-27 NOTE — Progress Notes (Signed)
[name], [name] ([id number]) Visit Report for 11/25/2016 Chief Complaint Document Details Patient Name: [name], [name] 11/25/2016 12:45 Date of Service: PM Medical Record [id number] Number: Patient Account Number: [id number] [birth year] (60 y.o. Treating RN: Phillis [name] Date of Birth/Sex: Female) Other Clinician: Primary Care Treating PATIENT, NO [name] Physician: Physician/Extender: Referring Physician: Annie [name] in Treatment: 1 Information Obtained from: Patient Chief Complaint Patient presents for treatment of an open ulcer due to venous insufficiency to her right lower extremity which [name]'s had for over 6 months Electronic Signature(s) Signed: 11/25/2016 1:34:14 PM By: [name] Entered By: [name] on 11/25/2016 13:34:14 [name] ([id number]) -------------------------------------------------------------------------------- Debridement Details Patient Name: [name], [name] 11/25/2016 12:45 Date of Service: PM Medical Record [id number] Number: Patient Account Number: [id number] November 24, 1956 (60 y.o. Treating RN: Phillis [name] Date of Birth/Sex: Female) Other Clinician: Primary Care Treating PATIENT, NO [name] Physician: Physician/Extender: Referring Physician: Annie [name] in Treatment: 1 Debridement Performed for Wound #1 Right,Anterior Lower Leg Assessment: Performed By: Physician Marilyn Kanner, MD Debridement: Debridement Pre-procedure Yes - 13:13 Verification/Time Out Taken: Start Time: 13:14 Pain Control: Lidocaine 4% Topical Solution Level: Skin/Subcutaneous Tissue Total Area Debrided (L x 3 (cm) x 3 (cm) = 9 (cm) W): Tissue and other Viable, Non-Viable, Exudate, Fibrin/Slough, Subcutaneous material debrided: Instrument: Curette Bleeding: Minimum Hemostasis Achieved: Pressure End Time: 13:15 Procedural Pain: 8 Post Procedural Pain: 10 Response to Treatment: Procedure was  tolerated well Post Debridement Measurements of Total Wound Length: (cm) 5 Width: (cm) 3.9 Depth: (cm) 0.1 Volume: (cm) 1.532 Character of Wound/Ulcer Post Requires Further Debridement Debridement: Severity of Tissue Post Debridement: Fat layer exposed Post Procedure Diagnosis Same as Pre-procedure Electronic Signature(s) Signed: 11/25/2016 1:34:08 PM By: [name] [name] ([id number]) Signed: 11/26/2016 5:23:32 PM By: [name] Entered By: [name] on 11/25/2016 13:34:07 [name] ([id number]) -------------------------------------------------------------------------------- HPI Details Patient Name: [name], [name] 11/25/2016 12:45 Date of Service: PM Medical Record [id number] Number: Patient Account Number: [id number] [birth year] (60 y.o. Treating RN: Phillis [name] Date of Birth/Sex: Female) Other Clinician: Primary Care Treating PATIENT, NO [name] Physician: Physician/Extender: Referring Physician: Annie [name] in Treatment: 1 History of Present Illness Location: ulcerated area on the right lower extremity Quality: Patient reports experiencing a sharp pain to affected area(s). Severity: Patient states wound are getting worse. Duration: Patient has had the wound for > 6 months prior to seeking treatment at the wound center Timing: Pain in wound is constant (hurts all the time) Context: The wound appeared gradually over time Modifying Factors: Other treatment(s) tried include:recent admission to hospital for a cellulitis and was treated with antibiotics and an Unna boot Associated Signs and Symptoms: Patient reports having increase swelling. HPI Description: 60 year old patient who was recently admitted to the hospital between 11/14/2016 and 11/15/2016 for right lower extremity ulcer with cellulitis. [name] was asked to stop clindamycin and doxycycline and her prednisone and take Levaquin 500 mg daily by mouth.  [name] was treated for a right lower extremity possibly due to venous stasis versus vascular insufficiency versus infection. [name] received IV vancomycin during her hospital stay. He is known to have rheumatoid arthritis and stopped taking her treatment because of lack of insurance. during her hospital stay vascular studies were done -- ABIs were 0.88 on the right and 1.1 on the left, [name] had triphasic flow on both lower extremities and a plain film of the right tibia and fibula showed no signs of osteomyelitis. [name] is not  a diabetic. [name] is a smoker however and continued to smoke. lab work noted that her sedimentation rate was 29, CRP was less than 0.8, hemoglobin A1c was 5.2 Electronic Signature(s) Signed: 11/25/2016 1:34:22 PM By: [name] Entered By: [name] on 11/25/2016 13:34:22 [name] ([id number]) -------------------------------------------------------------------------------- Physical Exam Details Patient Name: [name], [name] 11/25/2016 12:45 Date of Service: PM Medical Record [id number] Number: Patient Account Number: [id number] 23-Dec-1955 (60 y.o. Treating RN: Phillis [name] Date of Birth/Sex: Female) Other Clinician: Primary Care Treating PATIENT, NO [name] Physician: Physician/Extender: Referring Physician: Earl Lagos [name] in Treatment: 1 Constitutional . Pulse regular. Respirations normal and unlabored. Afebrile. . Eyes Nonicteric. Reactive to light. Ears, Nose, Mouth, and Throat Lips, teeth, and gums WNL.Marland Kitchen Moist mucosa without lesions. Neck supple and nontender. No palpable supraclavicular or cervical adenopathy. Normal sized without goiter. Respiratory WNL. No retractions.. Cardiovascular Pedal Pulses WNL. No clubbing, cyanosis or edema. Lymphatic No adneopathy. No adenopathy. No adenopathy. Musculoskeletal Adexa without tenderness or enlargement.. Digits and nails w/o clubbing, cyanosis, infection,  petechiae, ischemia, or inflammatory conditions.. Integumentary (Hair, Skin) No suspicious lesions. No crepitus or fluctuance. No peri-wound warmth or erythema. No masses.Marland Kitchen Psychiatric Judgement and insight Intact.. No evidence of depression, anxiety, or agitation.. Notes a lymphoid otherwise a bit better what the ulceration persists with a lot of slough which is extremely tender and I tried to sharply debride some of it but could not complete it due to her low threshold for pain Electronic Signature(s) Signed: 11/25/2016 1:35:05 PM By: [name] Entered By: [name] on 11/25/2016 13:35:04 Delauder, Evalee [name] ([id number]) -------------------------------------------------------------------------------- Physician Orders Details Patient Name: [name], [name] 11/25/2016 12:45 Date of Service: PM Medical Record [id number] Number: Patient Account Number: [id number] September 01, 1956 (60 y.o. Treating RN: Phillis [name] Date of Birth/Sex: Female) Other Clinician: Primary Care Treating PATIENT, NO Alexes Lamarque Physician: Physician/Extender: Referring Physician: Annie [name] in Treatment: 1 Verbal / Phone Orders: Yes Clinician: Pinkerton, Debi Read Back and Verified: Yes Diagnosis Coding Wound Cleansing Wound #1 Right,Anterior Lower Leg o Clean wound with Normal Saline. o Cleanse wound with mild soap and water Anesthetic Wound #1 Right,Anterior Lower Leg o Topical Lidocaine 4% cream applied to wound bed prior to debridement Primary Wound Dressing Wound #1 Right,Anterior Lower Leg o Aquacel Ag Secondary Dressing Wound #1 Right,Anterior Lower Leg o ABD pad o Dry Gauze o XtraSorb o Other - Charcoal Dressing Change Frequency Wound #1 Right,Anterior Lower Leg o Change dressing every week Follow-up Appointments Wound #1 Right,Anterior Lower Leg o Return Appointment in 1 week. Edema Control Wound #1 Right,Anterior Lower Leg o 3  Layer Compression System - Right Lower Extremity - anchor with DAYLIN, EADS. ([id number]) Additional Orders / Instructions Wound #1 Right,Anterior Lower Leg o Stop Smoking o Increase protein intake. Medications-please add to medication list. Wound #1 Right,Anterior Lower Leg o P.O. Antibiotics - continue your Levaquin o Other: - Vitamin C, Zinc, Vitamin A, MVI Electronic Signature(s) Signed: 11/25/2016 3:08:15 PM By: [name] Signed: 11/26/2016 5:23:32 PM By: [name] Entered By: [name] on 11/25/2016 13:17:10 Cogan, Evalee [name] ([id number]) -------------------------------------------------------------------------------- Problem List Details Patient Name: [name], [name] 11/25/2016 12:45 Date of Service: PM Medical Record [id number] Number: Patient Account Number: [id number] Aug 13, 1956 (60 y.o. Treating RN: Phillis [name] Date of Birth/Sex: Female) Other Clinician: Primary Care Treating PATIENT, NO Arsalan Brisbin Physician: Physician/Extender: Referring Physician: Annie [name] in Treatment: 1 Active Problems ICD-10 Encounter Code Description Active Date Diagnosis L97.212  Non-pressure chronic ulcer of right calf with fat layer 11/18/2016 Yes exposed I83.012 Varicose veins of right lower extremity with ulcer of calf 11/18/2016 Yes F17.218 Nicotine dependence, cigarettes, with other nicotine- 11/18/2016 Yes induced disorders M05.60 Rheumatoid arthritis of unspecified site with involvement 11/18/2016 Yes of other organs and systems Inactive Problems Resolved Problems Electronic Signature(s) Signed: 11/25/2016 1:33:51 PM By: [name] Entered By: [name] on 11/25/2016 13:33:51 Setzler, Evalee [name] ([id number]) -------------------------------------------------------------------------------- Progress Note Details Patient Name: [name], [name] 11/25/2016 12:45 Date of Service: PM Medical  Record [id number] Number: Patient Account Number: [id number] [birth year] (60 y.o. Treating RN: Phillis [name] Date of Birth/Sex: Female) Other Clinician: Primary Care Treating PATIENT, NO Zabella Wease Physician: Physician/Extender: Referring Physician: Annie [name] in Treatment: 1 Subjective Chief Complaint Information obtained from Patient Patient presents for treatment of an open ulcer due to venous insufficiency to her right lower extremity which [name]'s had for over 6 months History of Present Illness (HPI) The following HPI elements were documented for the patient's wound: Location: ulcerated area on the right lower extremity Quality: Patient reports experiencing a sharp pain to affected area(s). Severity: Patient states wound are getting worse. Duration: Patient has had the wound for > 6 months prior to seeking treatment at the wound center Timing: Pain in wound is constant (hurts all the time) Context: The wound appeared gradually over time Modifying Factors: Other treatment(s) tried include:recent admission to hospital for a cellulitis and was treated with antibiotics and an Unna boot Associated Signs and Symptoms: Patient reports having increase swelling. 60 year old patient who was recently admitted to the hospital between 11/14/2016 and 11/15/2016 for right lower extremity ulcer with cellulitis. [name] was asked to stop clindamycin and doxycycline and her prednisone and take Levaquin 500 mg daily by mouth. [name] was treated for a right lower extremity possibly due to venous stasis versus vascular insufficiency versus infection. [name] received IV vancomycin during her hospital stay. He is known to have rheumatoid arthritis and stopped taking her treatment because of lack of insurance. during her hospital stay vascular studies were done -- ABIs were 0.88 on the right and 1.1 on the left, [name] had triphasic flow on both lower extremities and a plain film of the right tibia  and fibula showed no signs of osteomyelitis. [name] is not a diabetic. [name] is a smoker however and continued to smoke. lab work noted that her sedimentation rate was 29, CRP was less than 0.8, hemoglobin A1c was 5.2 Objective [name], [name] ([id number]) Constitutional Pulse regular. Respirations normal and unlabored. Afebrile. Vitals Time Taken: 12:51 PM, Height: 63 in, Weight: 214.7 lbs, BMI: 38, Temperature: 98.1 F, Pulse: 82 bpm, Respiratory Rate: 20 breaths/min, Blood Pressure: 153/78 mmHg. Eyes Nonicteric. Reactive to light. Ears, Nose, Mouth, and Throat Lips, teeth, and gums WNL.Marland Kitchen Moist mucosa without lesions. Neck supple and nontender. No palpable supraclavicular or cervical adenopathy. Normal sized without goiter. Respiratory WNL. No retractions.. Cardiovascular Pedal Pulses WNL. No clubbing, cyanosis or edema. Lymphatic No adneopathy. No adenopathy. No adenopathy. Musculoskeletal Adexa without tenderness or enlargement.. Digits and nails w/o clubbing, cyanosis, infection, petechiae, ischemia, or inflammatory conditions.Marland Kitchen Psychiatric Judgement and insight Intact.. No evidence of depression, anxiety, or agitation.. General Notes: a lymphoid otherwise a bit better what the ulceration persists with a lot of slough which is extremely tender and I tried to sharply debride some of it but could not complete it due to her low threshold for pain Integumentary (Hair, Skin) No suspicious lesions. No crepitus or fluctuance.  No peri-wound warmth or erythema. No masses.. Wound #1 status is Open. Original cause of wound was Gradually Appeared. The wound is located on the Right,Anterior Lower Leg. The wound measures 5cm length x 3.9cm width x 0.1cm depth; 15.315cm^2 area and 1.532cm^3 volume. The wound is limited to skin breakdown. There is no tunneling or undermining noted. There is a large amount of purulent drainage noted. The wound margin is distinct with the outline attached to the  wound base. There is no granulation within the wound bed. There is a large (67-100%) amount of necrotic tissue within the wound bed including Adherent Slough. The periwound skin appearance exhibited: Localized Edema, Moist, Erythema. The surrounding wound skin color is noted with erythema which is circumferential. Periwound temperature was noted as No Abnormality. The periwound has tenderness on palpation. [name], [name] ([id number]) Assessment Active Problems ICD-10 8630992877 - Non-pressure chronic ulcer of right calf with fat layer exposed I83.012 - Varicose veins of right lower extremity with ulcer of calf F17.218 - Nicotine dependence, cigarettes, with other nicotine-induced disorders M05.60 - Rheumatoid arthritis of unspecified site with involvement of other organs and systems Procedures Wound #1 Wound #1 is a Venous Leg Ulcer located on the Right,Anterior Lower Leg . There was a Skin/Subcutaneous Tissue Debridement ([id number]) debridement with total area of 9 sq cm performed by Marilyn Kanner, MD. with the following instrument(s): Curette to remove Viable and Non-Viable tissue/material including Exudate, Fibrin/Slough, and Subcutaneous after achieving pain control using Lidocaine 4% Topical Solution. A time out was conducted at 13:13, prior to the start of the procedure. A Minimum amount of bleeding was controlled with Pressure. The procedure was tolerated well with a pain level of 8 throughout and a pain level of 10 following the procedure. Post Debridement Measurements: 5cm length x 3.9cm width x 0.1cm depth; 1.532cm^3 volume. Character of Wound/Ulcer Post Debridement requires further debridement. Severity of Tissue Post Debridement is: Fat layer exposed. Post procedure Diagnosis Wound #1: Same as Pre-Procedure Plan Wound Cleansing: Wound #1 Right,Anterior Lower Leg: Clean wound with Normal Saline. Cleanse wound with mild soap and water Anesthetic: Wound #1 Right,Anterior  Lower Leg: Topical Lidocaine 4% cream applied to wound bed prior to debridement Primary Wound Dressing: Wound #1 Right,Anterior Lower Leg: Aquacel Ag [name], [name] ([id number]) Secondary Dressing: Wound #1 Right,Anterior Lower Leg: ABD pad Dry Gauze XtraSorb Other - Charcoal Dressing Change Frequency: Wound #1 Right,Anterior Lower Leg: Change dressing every week Follow-up Appointments: Wound #1 Right,Anterior Lower Leg: Return Appointment in 1 week. Edema Control: Wound #1 Right,Anterior Lower Leg: 3 Layer Compression System - Right Lower Extremity - anchor with unna Additional Orders / Instructions: Wound #1 Right,Anterior Lower Leg: Stop Smoking Increase protein intake. Medications-please add to medication list.: Wound #1 Right,Anterior Lower Leg: P.O. Antibiotics - continue your Levaquin Other: - Vitamin C, Zinc, Vitamin A, MVI I have recommended: 1. Silver alginate and a 3 layer Profore compression to be changed every week 2. Elevation and exercise 3. venous duplex studies of both lower extremity to be done soon as her insurance coverage is achieved -- ointment pending 4. I have urged her to completely give up smoking. 5. regular visits to the wound center Electronic Signature(s) Signed: 11/25/2016 1:36:16 PM By: [name] Entered By: [name] on 11/25/2016 13:36:16 Mcgaughy, Evalee [name] ([id number]) -------------------------------------------------------------------------------- SuperBill Details Patient Name: [name]. Date of Service: 11/25/2016 Medical Record Number: [id number] Patient Account Number: [id number] Date of Birth/Sex: 12/23/1955 (59 y.o. Female) Treating RN: Phillis [name] Primary Care  Physician: PATIENT, NO Other Clinician: Referring Physician: Earl LagosNARENDRA, NISCHAL Treating Physician/Extender: Rudene ReBritto, Jesiah Grismer [name] in Treatment: 1 Diagnosis Coding ICD-10 Codes Code Description [phone] Non-pressure chronic ulcer of right  calf with fat layer exposed I83.012 Varicose veins of right lower extremity with ulcer of calf F17.218 Nicotine dependence, cigarettes, with other nicotine-induced disorders M05.60 Rheumatoid arthritis of unspecified site with involvement of other organs and systems Facility Procedures CPT4: Description Modifier Quantity Code [id number] 11042 - DEB SUBQ TISSUE 20 SQ CM/< 1 ICD-10 Description Diagnosis 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 layer exposed I83.012 Varicose veins of right lower extremity with ulcer of calf  F17.218 Nicotine dependence, cigarettes, with other nicotine-induced disorders M05.60 Rheumatoid arthritis of unspecified site with involvement of other organs and systems Physician Procedures CPT4: Description Modifier Quantity Code [id number] 11042 - WC PHYS SUBQ TISS 20 SQ CM 1 ICD-10 Description Diagnosis 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 ([id number]) Entered By: Marilyn KannerBritto, Joanthony Hamza on 11/25/2016 13:36:27

## 2016-11-28 ENCOUNTER — Telehealth: Payer: Self-pay | Admitting: *Deleted

## 2016-11-28 MED ORDER — ONDANSETRON 8 MG PO TBDP: 8.0000 mg | ORAL_TABLET | Freq: Three times a day (TID) | ORAL | 0 refills | Status: DC | PRN

## 2016-11-28 NOTE — Telephone Encounter (Signed)
Please call patient and let her know I sent Zofran to her [facility] pharmacy! Thanks! Would still like to see her for f/u.

## 2016-11-28 NOTE — Telephone Encounter (Signed)
Pt need appt to see Dr. Vincente Liberty (for labs also) around 12/22 per Dr. Vincente Liberty. Thanks!

## 2016-11-28 NOTE — Telephone Encounter (Signed)
Called patient & informed that Zofran has been filled & ready. Appt made for 12/22 to see Dr. Vincente Liberty.

## 2016-11-28 NOTE — Telephone Encounter (Signed)
Patient called asking for Zofran that she was supposed to get on her last appt on 12/8 but was not prescribed. She stated she has intermittent nausea more @ nights.

## 2016-11-29 ENCOUNTER — Encounter (HOSPITAL_BASED_OUTPATIENT_CLINIC_OR_DEPARTMENT_OTHER): Payer: MEDICAID

## 2016-11-29 MED ORDER — ONDANSETRON 8 MG PO TBDP: 8.0000 mg | ORAL_TABLET | Freq: Three times a day (TID) | ORAL | 0 refills | Status: DC | PRN

## 2016-11-29 NOTE — Addendum Note (Signed)
Addended by: Mable Fill on: 11/29/2016 08:37 AM   Modules accepted: Orders

## 2016-12-02 ENCOUNTER — Encounter: Payer: Self-pay | Admitting: Surgery

## 2016-12-02 ENCOUNTER — Encounter (HOSPITAL_COMMUNITY): Payer: Self-pay

## 2016-12-02 NOTE — Progress Notes (Signed)
[name] ([id number]) Visit Report for 12/02/2016 Chief Complaint Document Details Patient Name: [name], [name] 12/02/2016 2:45 Date of Service: PM Medical Record [id number] Number: Patient Account Number: [id number] Aug 30, 1956 (60 y.o. Treating RN: Phillis Haggis Date of Birth/Sex: Female) Other Clinician: Primary Care Physician: PATIENT, NO Treating Ivonna Kinnick Referring Physician: Earl Lagos Physician/Extender: Weeks in Treatment: 2 Information Obtained from: Patient Chief Complaint Patient presents for treatment of an open ulcer due to venous insufficiency to her right lower extremity which she's had for over 6 months Electronic Signature(s) Signed: 12/02/2016 3:44:59 PM By: Evlyn Kanner MD, FACS Entered By: Evlyn Kanner on 12/02/2016 15:44:59 Butrum, Evalee [name] ([id number]) -------------------------------------------------------------------------------- HPI Details Patient Name: [name] 12/02/2016 2:45 Date of Service: PM Medical Record [id number] Number: Patient Account Number: [id number] [birth year] (60 y.o. Treating RN: Phillis Haggis Date of Birth/Sex: Female) Other Clinician: Primary Care Physician: PATIENT, NO Treating Shakemia Madera Referring Physician: Earl Lagos Physician/Extender: Weeks in Treatment: 2 History of Present Illness Location: ulcerated area on the right lower extremity Quality: Patient reports experiencing a sharp pain to affected area(s). Severity: Patient states wound are getting worse. Duration: Patient has had the wound for > 6 months prior to seeking treatment at the wound center Timing: Pain in wound is constant (hurts all the time) Context: The wound appeared gradually over time Modifying Factors: Other treatment(s) tried include:recent admission to hospital for a cellulitis and was treated with antibiotics and an Unna boot Associated Signs and Symptoms: Patient reports having increase  swelling. HPI Description: 60 year old patient who was recently admitted to the hospital between 11/14/2016 and 11/15/2016 for right lower extremity ulcer with cellulitis. she was asked to stop clindamycin and doxycycline and her prednisone and take Levaquin 500 mg daily by mouth. she was treated for a right lower extremity possibly due to venous stasis versus vascular insufficiency versus infection. She received IV vancomycin during her hospital stay. He is known to have rheumatoid arthritis and stopped taking her treatment because of lack of insurance. during her hospital stay vascular studies were done -- ABIs were 0.88 on the right and 1.1 on the left, she had triphasic flow on both lower extremities and a plain film of the right tibia and fibula showed no signs of osteomyelitis. She is not a diabetic. She is a smoker however and continued to smoke. lab work noted that her sedimentation rate was 29, CRP was less than 0.8, hemoglobin A1c was 5.2 12/02/2016 -- due to a family emergency and social economic reasons she has been unable to keep her appointment with the vascular department for her workup and is expecting insurance coverage after January 1 Electronic Signature(s) Signed: 12/02/2016 3:45:28 PM By: Evlyn Kanner MD, FACS Entered By: Evlyn Kanner on 12/02/2016 15:45:28 [name] ([id number]) -------------------------------------------------------------------------------- Physical Exam Details Patient Name: [name] 12/02/2016 2:45 Date of Service: PM Medical Record [id number] Number: Patient Account Number: [id number] 12/19/1955 (60 y.o. Treating RN: Phillis Haggis Date of Birth/Sex: Female) Other Clinician: Primary Care Physician: PATIENT, NO Treating Norene Oliveri Referring Physician: Earl Lagos Physician/Extender: Weeks in Treatment: 2 Constitutional . Pulse regular. Respirations normal and unlabored. Afebrile. . Eyes Nonicteric. Reactive to  light. Ears, Nose, Mouth, and Throat Lips, teeth, and gums WNL.Marland Kitchen Moist mucosa without lesions. Neck supple and nontender. No palpable supraclavicular or cervical adenopathy. Normal sized without goiter. Respiratory WNL. No retractions.. Breath sounds WNL, No rubs, rales, rhonchi, or wheeze.. Cardiovascular Heart rhythm and rate regular, no murmur or gallop.. Pedal Pulses WNL. No clubbing,  cyanosis or edema. Lymphatic No adneopathy. No adenopathy. No adenopathy. Musculoskeletal Adexa without tenderness or enlargement.. Digits and nails w/o clubbing, cyanosis, infection, petechiae, ischemia, or inflammatory conditions.. Integumentary (Hair, Skin) No suspicious lesions. No crepitus or fluctuance. No peri-wound warmth or erythema. No masses.Marland Kitchen Psychiatric Judgement and insight Intact.. No evidence of depression, anxiety, or agitation.. Notes the lymphedema persists and the ulceration has a lot of slough but she is too anxious and tended to allow me to do any debridement and has not given consent for debridement today Electronic Signature(s) Signed: 12/02/2016 3:46:25 PM By: Evlyn Kanner MD, FACS Entered By: Evlyn Kanner on 12/02/2016 15:46:25 [name] ([id number]) -------------------------------------------------------------------------------- Physician Orders Details Patient Name: [name] 12/02/2016 2:45 Date of Service: PM Medical Record [id number] Number: Patient Account Number: [id number] November 03, 1956 (60 y.o. Treating RN: Phillis Haggis Date of Birth/Sex: Female) Other Clinician: Primary Care Physician: PATIENT, NO Treating Jisele Price Referring Physician: Earl Lagos Physician/Extender: Tania Ade in Treatment: 2 Verbal / Phone Orders: Yes ClinicianAshok Cordia, Debi Read Back and Verified: Yes Diagnosis Coding Wound Cleansing Wound #1 Right,Anterior Lower Leg o Clean wound with Normal Saline. o Cleanse wound with mild soap and  water Anesthetic Wound #1 Right,Anterior Lower Leg o Topical Lidocaine 4% cream applied to wound bed prior to debridement Primary Wound Dressing Wound #1 Right,Anterior Lower Leg o Santyl Ointment Secondary Dressing Wound #1 Right,Anterior Lower Leg o ABD pad o Dry Gauze o XtraSorb Dressing Change Frequency Wound #1 Right,Anterior Lower Leg o Change dressing every week Follow-up Appointments Wound #1 Right,Anterior Lower Leg o Return Appointment in 1 week. Edema Control Wound #1 Right,Anterior Lower Leg o 3 Layer Compression System - Right Lower Extremity - anchor with unna Additional Orders / Instructions Wound #1 Right,Anterior Lower Leg Coderre, Manasi K. ([id number]) o Stop Smoking o Increase protein intake. Medications-please add to medication list. Wound #1 Right,Anterior Lower Leg o P.O. Antibiotics - continue your Levaquin o Other: - Vitamin C, Zinc, Vitamin A, MVI Electronic Signature(s) Signed: 12/02/2016 4:07:29 PM By: Alejandro Mulling Signed: 12/02/2016 4:08:26 PM By: Evlyn Kanner MD, FACS Entered By: Alejandro Mulling on 12/02/2016 15:33:58 Haupert, Evalee [name] ([id number]) -------------------------------------------------------------------------------- Problem List Details Patient Name: DERRIONNA, FANFAN 12/02/2016 2:45 Date of Service: PM Medical Record [id number] Number: Patient Account Number: [id number] [birth year] (60 y.o. 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/18/2016 Yes exposed I83.012 Varicose veins of right lower extremity with ulcer of calf 11/18/2016 Yes F17.218 Nicotine dependence, cigarettes, with other  nicotine- 11/18/2016 Yes induced disorders M05.60 Rheumatoid arthritis of unspecified site with involvement 11/18/2016 Yes of other organs and systems Inactive Problems Resolved Problems Electronic Signature(s) Signed: 12/02/2016 3:44:32 PM By: Evlyn Kanner MD, FACS Entered By: Evlyn Kanner on 12/02/2016 15:44:32 Croft, Evalee [name] ([id number]) -------------------------------------------------------------------------------- Progress Note Details Patient Name: [name] 12/02/2016 2:45 Date of Service: PM Medical Record [id number] Number: Patient Account Number: [id number] [birth year] (60 y.o. Treating RN: Phillis Haggis Date of Birth/Sex: Female) Other Clinician: Primary Care Physician: PATIENT, NO Treating Nahdia Doucet Referring Physician: Earl Lagos Physician/Extender: Weeks in Treatment: 2 Subjective Chief Complaint Information obtained from Patient Patient presents for treatment of an open ulcer due to venous insufficiency to her right lower extremity which she's had for over 6 months History of Present Illness (HPI) The following  HPI elements were documented for the patient's wound: Location: ulcerated area on the right lower extremity Quality: Patient reports experiencing a sharp pain to affected area(s). Severity: Patient states wound are getting worse. Duration: Patient has had the wound for > 6 months prior to seeking treatment at the wound center Timing: Pain in wound is constant (hurts all the time) Context: The wound appeared gradually over time Modifying Factors: Other treatment(s) tried include:recent admission to hospital for a cellulitis and was treated with antibiotics and an Unna boot Associated Signs and Symptoms: Patient reports having increase swelling. 60 year old patient who was recently admitted to the hospital between 11/14/2016 and 11/15/2016 for right lower extremity ulcer with cellulitis. she was asked to stop clindamycin and  doxycycline and her prednisone and take Levaquin 500 mg daily by mouth. she was treated for a right lower extremity possibly due to venous stasis versus vascular insufficiency versus infection. She received IV vancomycin during her hospital stay. He is known to have rheumatoid arthritis and stopped taking her treatment because of lack of insurance. during her hospital stay vascular studies were done -- ABIs were 0.88 on the right and 1.1 on the left, she had triphasic flow on both lower extremities and a plain film of the right tibia and fibula showed no signs of osteomyelitis. She is not a diabetic. She is a smoker however and continued to smoke. lab work noted that her sedimentation rate was 29, CRP was less than 0.8, hemoglobin A1c was 5.2 12/02/2016 -- due to a family emergency and social economic reasons she has been unable to keep her appointment with the vascular department for her workup and is expecting insurance coverage after January 1 [name], [name]. ([id number]) Objective Constitutional Pulse regular. Respirations normal and unlabored. Afebrile. Vitals Time Taken: 3:12 PM, Height: 63 in, Weight: 214.7 lbs, BMI: 38, Temperature: 98.3 F, Pulse: 100 bpm, Respiratory Rate: 20 breaths/min, Blood Pressure: 151/74 mmHg. Eyes Nonicteric. Reactive to light. Ears, Nose, Mouth, and Throat Lips, teeth, and gums WNL.Marland Kitchen Moist mucosa without lesions. Neck supple and nontender. No palpable supraclavicular or cervical adenopathy. Normal sized without goiter. Respiratory WNL. No retractions.. Breath sounds WNL, No rubs, rales, rhonchi, or wheeze.. Cardiovascular Heart rhythm and rate regular, no murmur or gallop.. Pedal Pulses WNL. No clubbing, cyanosis or edema. Lymphatic No adneopathy. No adenopathy. No adenopathy. Musculoskeletal Adexa without tenderness or enlargement.. Digits and nails w/o clubbing, cyanosis, infection, petechiae, ischemia, or inflammatory  conditions.Marland Kitchen Psychiatric Judgement and insight Intact.. No evidence of depression, anxiety, or agitation.. General Notes: the lymphedema persists and the ulceration has a lot of slough but she is too anxious and tended to allow me to do any debridement and has not given consent for debridement today Integumentary (Hair, Skin) No suspicious lesions. No crepitus or fluctuance. No peri-wound warmth or erythema. No masses.. Wound #1 status is Open. Original cause of wound was Gradually Appeared. The wound is located on the Right,Anterior Lower Leg. The wound measures 4.4cm length x 3.7cm width x 0.1cm depth; 12.786cm^2 area and 1.279cm^3 volume. The wound is limited to skin breakdown. There is no tunneling or undermining noted. There is a large amount of purulent drainage noted. The wound margin is distinct with the outline attached to the wound base. There is small (1-33%) pink granulation within the wound bed. There is a large (67-100%) amount of necrotic tissue within the wound bed including Adherent Slough. The periwound skin appearance exhibited: Localized Edema, Moist, Erythema. The surrounding wound skin color is noted with  erythema which is circumferential. Periwound temperature was noted as No Abnormality. The periwound Gassert, Gwendolyne K. ([id number]) has tenderness on palpation. Assessment Active Problems ICD-10 L97.212 - Non-pressure chronic ulcer of right calf with fat layer exposed I83.012 - Varicose veins of right lower extremity with ulcer of calf F17.218 - Nicotine dependence, cigarettes, with other nicotine-induced disorders M05.60 - Rheumatoid arthritis of unspecified site with involvement of other organs and systems Plan Wound Cleansing: Wound #1 Right,Anterior Lower Leg: Clean wound with Normal Saline. Cleanse wound with mild soap and water Anesthetic: Wound #1 Right,Anterior Lower Leg: Topical Lidocaine 4% cream applied to wound bed prior to debridement Primary Wound  Dressing: Wound #1 Right,Anterior Lower Leg: Santyl Ointment Secondary Dressing: Wound #1 Right,Anterior Lower Leg: ABD pad Dry Gauze XtraSorb Dressing Change Frequency: Wound #1 Right,Anterior Lower Leg: Change dressing every week Follow-up Appointments: Wound #1 Right,Anterior Lower Leg: Return Appointment in 1 week. Edema Control: Wound #1 Right,Anterior Lower Leg: 3 Layer Compression System - Right Lower Extremity - anchor with unna Additional Orders / Instructions: Wound #1 Right,Anterior Lower Leg: Stop Smoking Increase protein intake. [name], [name] ([id number]) Medications-please add to medication list.: Wound #1 Right,Anterior Lower Leg: P.O. Antibiotics - continue your Levaquin Other: - Vitamin C, Zinc, Vitamin A, MVI I have recommended: 1. Santyl ointment and a 3 layer Profore compression to be changed every week 2. Elevation and exercise 3. venous duplex studies of both lower extremity to be done soon as her insurance coverage is achieved -- appointment still pending 4. I have urged her to completely give up smoking. 5. regular visits to the wound center Electronic Signature(s) Signed: 12/02/2016 3:47:21 PM By: Evlyn Kanner MD, FACS Entered By: Evlyn Kanner on 12/02/2016 15:47:21 Critcher, Evalee [name] ([id number]) -------------------------------------------------------------------------------- SuperBill Details Patient Name: [name]. Date of Service: 12/02/2016 Medical Record Number: [id number] Patient Account Number: [id number] Date of Birth/Sex: 1956/08/04 (60 y.o. Female) Treating RN: Ashok Cordia, Debi Primary Care Physician: PATIENT, NO Other Clinician: Referring Physician: Earl Lagos Treating Physician/Extender: Rudene Re in Treatment: 2 Diagnosis Coding ICD-10 Codes Code Description [phone] Non-pressure chronic ulcer of right calf with fat layer exposed I83.012 Varicose veins of right lower extremity with ulcer of  calf F17.218 Nicotine dependence, cigarettes, with other nicotine-induced disorders M05.60 Rheumatoid arthritis of unspecified site with involvement of other organs and systems Facility Procedures CPT4: Description Modifier Quantity Code [id number] (Facility Use Only) [phone] - APPLY MULTLAY COMPRS LWR RT 1 LEG Physician Procedures CPT4: Description Modifier Quantity Code [id number] - WC PHYS LEVEL 3 - EST PT 1 ICD-10 Description Diagnosis 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 layer exposed I83.012 Varicose veins of right lower extremity with ulcer of calf  F17.218 Nicotine dependence, cigarettes, with other nicotine-induced disorders M05.60 Rheumatoid arthritis of unspecified site with involvement of other organs and systems Electronic Signature(s) Signed: 12/02/2016 4:07:29 PM By: Alejandro Mulling Signed: 12/02/2016 4:08:26 PM By: Evlyn Kanner MD, FACS Previous Signature: 12/02/2016 3:47:34 PM Version By: Evlyn Kanner MD, FACS Entered By: Alejandro Mulling on 12/02/2016 16:03:11

## 2016-12-02 NOTE — Progress Notes (Signed)
Internal Medicine Clinic Attending  I saw and evaluated the patient.  I personally confirmed the key portions of the history and exam documented by Dr. Molt and I reviewed pertinent patient test results.  The assessment, diagnosis, and plan were formulated together and I agree with the documentation in the resident's note. 

## 2016-12-02 NOTE — Progress Notes (Signed)
MADY, OUBRE ([id number]) Visit Report for 12/02/2016 Arrival Information Details Patient Name: [name], [name]. Date of Service: 12/02/2016 2:45 PM Medical Record Number: [id number] Patient Account Number: [id number] Date of Birth/Sex: Jan 18, 1956 (60 y.o. Female) Treating RN: Carolyne Fiscal, Debi Primary Care Physician: PATIENT, NO Other Clinician: Referring Physician: Aldine Contes Treating Physician/Extender: Frann Rider in Treatment: 2 Visit Information History Since Last Visit All ordered tests and consults were completed: No Patient Arrived: Ambulatory Added or deleted any medications: No Arrival Time: 15:09 Any new allergies or adverse reactions: No Accompanied By: self Had a fall or experienced change in No Transfer Assistance: None activities of daily living that may affect Patient Identification Verified: Yes risk of falls: Secondary Verification Process Yes Signs or symptoms of abuse/neglect since last No Completed: visito Patient Requires Transmission- No Hospitalized since last visit: No Based Precautions: Has Dressing in Place as Prescribed: Yes Patient Has Alerts: Yes Has Compression in Place as Prescribed: Yes Patient Alerts: L Leg ABI 1.2 Pain Present Now: Yes R Leg ABI 0.88 ABIs done in [place] Signature(s) Signed: 12/02/2016 4:07:29 PM By: Alric Quan Entered By: Alric Quan on 12/02/2016 15:11:35 Flannigan, [name] ([id number]) -------------------------------------------------------------------------------- Encounter Discharge Information Details Patient Name: [name]. Date of Service: 12/02/2016 2:45 PM Medical Record Number: [id number] Patient Account Number: [id number] Date of Birth/Sex: 1956-11-28 (60 y.o. Female) Treating RN: Carolyne Fiscal, Debi Primary Care Physician: PATIENT, NO Other Clinician: Referring Physician: Aldine Contes Treating Physician/Extender: Frann Rider in Treatment:  2 Encounter Discharge Information Items Discharge Pain Level: 0 Discharge Condition: Stable Ambulatory Status: Ambulatory Discharge Destination: Home Transportation: Private Auto Accompanied By: self Schedule Follow-up Appointment: Yes Medication Reconciliation completed and provided to Patient/Care Yes Keean Wilmeth: Provided on Clinical Summary of Care: 12/02/2016 Form Type Recipient Paper Patient JP Electronic Signature(s) Signed: 12/02/2016 3:53:26 PM By: Ruthine Dose Entered By: Ruthine Dose on 12/02/2016 15:53:26 [name], [name] ([id number]) -------------------------------------------------------------------------------- Lower Extremity Assessment Details Patient Name: [name]. Date of Service: 12/02/2016 2:45 PM Medical Record Number: [id number] Patient Account Number: [id number] Date of Birth/Sex: [birth year] (60 y.o. Female) Treating RN: Carolyne Fiscal, Debi Primary Care Physician: PATIENT, NO Other Clinician: Referring Physician: Aldine Contes Treating Physician/Extender: Frann Rider in Treatment: 2 Edema Assessment Assessed: [Left: No] [Right: No] E[Left: dema] [Right: :] Calf Left: Right: Point of Measurement: 32 cm From Medial Instep cm 48.6 cm Ankle Left: Right: Point of Measurement: 9 cm From Medial Instep cm 29.4 cm Vascular Assessment Pulses: Posterior Tibial Popliteal Palpable: [Right:Yes] Extremity colors, hair growth, and conditions: Extremity Color: [Right:Hyperpigmented] Temperature of Extremity: [Right:Warm] Capillary Refill: [Right:< 3 seconds] Toe Nail Assessment Left: Right: Thick: Yes Discolored: Yes Deformed: Yes Improper Length and Hygiene: Yes Electronic Signature(s) Signed: 12/02/2016 4:07:29 PM By: Alric Quan Entered By: Alric Quan on 12/02/2016 15:26:17 [name], [name] ([id number]) -------------------------------------------------------------------------------- Multi Wound Chart Details Patient  Name: [name]. Date of Service: 12/02/2016 2:45 PM Medical Record Number: [id number] Patient Account Number: [id number] Date of Birth/Sex: 1956/07/02 (60 y.o. Female) Treating RN: Carolyne Fiscal, Debi Primary Care Physician: PATIENT, NO Other Clinician: Referring Physician: Aldine Contes Treating Physician/Extender: Frann Rider in Treatment: 2 Vital Signs Height(in): 63 Pulse(bpm): 100 Weight(lbs): 214.7 Blood Pressure 151/74 (mmHg): Body Mass Index(BMI): 38 Temperature(F): 98.3 Respiratory Rate 20 (breaths/min): Photos: [1:No Photos] [N/A:N/A] Wound Location: [1:Right Lower Leg - Anterior N/A] Wounding Event: [1:Gradually Appeared] [N/A:N/A] Primary Etiology: [1:Venous Leg Ulcer] [N/A:N/A] Comorbid History: [1:Chronic Obstructive Pulmonary Disease (COPD), Hypertension, Rheumatoid Arthritis] [N/A:N/A] Date Acquired: [1:05/19/2016] [N/A:N/A] Weeks of Treatment: [1:2] [  N/A:N/A] Wound Status: [1:Open] [N/A:N/A] Measurements L x W x D 4.4x3.7x0.1 [N/A:N/A] (cm) Area (cm) : [1:12.786] [N/A:N/A] Volume (cm) : [1:1.279] [N/A:N/A] % Reduction in Area: [1:28.60%] [N/A:N/A] % Reduction in Volume: 28.50% [N/A:N/A] Classification: [1:Partial Thickness] [N/A:N/A] Exudate Amount: [1:Large] [N/A:N/A] Exudate Type: [1:Purulent] [N/A:N/A] Exudate Color: [1:yellow, brown, green] [N/A:N/A] Wound Margin: [1:Distinct, outline attached N/A] Granulation Amount: [1:Small (1-33%)] [N/A:N/A] Granulation Quality: [1:Pink] [N/A:N/A] Necrotic Amount: [1:Large (67-100%)] [N/A:N/A] Exposed Structures: [1:Fascia: No Fat: No Tendon: No Muscle: No Joint: No] [N/A:N/A] Bone: No Limited to Skin Breakdown Epithelialization: None N/A N/A Periwound Skin Texture: Edema: Yes N/A N/A Periwound Skin Moist: Yes N/A N/A Moisture: Periwound Skin Color: Erythema: Yes N/A N/A Erythema Location: Circumferential N/A N/A Temperature: No Abnormality N/A N/A Tenderness on Yes N/A  N/A Palpation: Wound Preparation: Ulcer Cleansing: N/A N/A Rinsed/Irrigated with Saline, Other: soap and water Topical Anesthetic Applied: Other: lidocaine 4% Treatment Notes Electronic Signature(s) Signed: 12/02/2016 4:07:29 PM By: Alric Quan Entered By: Alric Quan on 12/02/2016 15:29:43 [name], [name] ([id number]) -------------------------------------------------------------------------------- [place] Details Patient Name: [name], [name]. Date of Service: 12/02/2016 2:45 PM Medical Record Number: [id number] Patient Account Number: [id number] Date of Birth/Sex: June 17, 1956 (60 y.o. Female) Treating RN: Carolyne Fiscal, Debi Primary Care Physician: PATIENT, NO Other Clinician: Referring Physician: Aldine Contes Treating Physician/Extender: Frann Rider in Treatment: 2 Active Inactive Abuse / Safety / Falls / Self Care Management Nursing Diagnoses: Potential for falls Goals: Patient will remain injury free Date Initiated: 11/18/2016 Goal Status: Active Interventions: Assess fall risk on admission and as needed Assess self care needs on admission and as needed Notes: Nutrition Nursing Diagnoses: Imbalanced nutrition Goals: Patient/caregiver agrees to and verbalizes understanding of need to use nutritional supplements and/or vitamins as prescribed Date Initiated: 11/18/2016 Goal Status: Active Interventions: Assess patient nutrition upon admission and as needed per policy Notes: Orientation to the Wound Care Program Nursing Diagnoses: Knowledge deficit related to the wound healing center program Goals: [name], [name] ([id number]) Patient/caregiver will verbalize understanding of the [place] Date Initiated: 11/18/2016 Goal Status: Active Interventions: Provide education on orientation to the wound center Notes: Pain, Acute or Chronic Nursing Diagnoses: Pain, acute or chronic: actual or  potential Potential alteration in comfort, pain Goals: Patient will verbalize adequate pain control and receive pain control interventions during procedures as needed Date Initiated: 11/18/2016 Goal Status: Active Patient/caregiver will verbalize adequate pain control between visits Date Initiated: 11/18/2016 Goal Status: Active Patient/caregiver will verbalize comfort level met Date Initiated: 11/18/2016 Goal Status: Active Interventions: Assess comfort goal upon admission Complete pain assessment as per visit requirements Notes: Soft Tissue Infection Nursing Diagnoses: Impaired tissue integrity Knowledge deficit related to disease process and management Knowledge deficit related to home infection control: handwashing, handling of soiled dressings, supply storage Goals: Patient/caregiver will verbalize understanding of or measures to prevent infection and contamination in the home setting Date Initiated: 11/18/2016 Goal Status: Active Patient's soft tissue infection will resolve [name], [name] ([id number]) Date Initiated: 11/18/2016 Goal Status: Active Interventions: Assess signs and symptoms of infection every visit Provide education on infection Treatment Activities: Education provided on Infection : 11/18/2016 Notes: Wound/Skin Impairment Nursing Diagnoses: Impaired tissue integrity Goals: Ulcer/skin breakdown will have a volume reduction of 30% by week 4 Date Initiated: 11/18/2016 Goal Status: Active Ulcer/skin breakdown will have a volume reduction of 50% by week 8 Date Initiated: 11/18/2016 Goal Status: Active Ulcer/skin breakdown will have a volume reduction of 80% by week 12 Date Initiated: 11/18/2016 Goal Status: Active Interventions:  Assess patient/caregiver ability to perform ulcer/skin care regimen upon admission and as needed Assess ulceration(s) every visit Provide education on smoking Notes: Electronic Signature(s) Signed: 12/02/2016 4:07:29 PM By:  Alric Quan Entered By: Alric Quan on 12/02/2016 15:29:37 [name], [name] ([id number]) -------------------------------------------------------------------------------- Pain Assessment Details Patient Name: [name]. Date of Service: 12/02/2016 2:45 PM Medical Record Number: [id number] Patient Account Number: [id number] Date of Birth/Sex: 02/05/1956 (60 y.o. Female) Treating RN: Carolyne Fiscal, Debi Primary Care Physician: PATIENT, NO Other Clinician: Referring Physician: Aldine Contes Treating Physician/Extender: Frann Rider in Treatment: 2 Active Problems Location of Pain Severity and Description of Pain Patient Has Paino Yes Site Locations Pain Location: Pain in Ulcers With Dressing Change: Yes Duration of the Pain. Constant / Intermittento Constant Rate the pain. Current Pain Level: 8 Worst Pain Level: 10 Least Pain Level: 2 Character of Pain Describe the Pain: Aching, Burning Pain Management and Medication Current Pain Management: Electronic Signature(s) Signed: 12/02/2016 4:07:29 PM By: Alric Quan Entered By: Alric Quan on 12/02/2016 15:11:52 [name], [name] ([id number]) -------------------------------------------------------------------------------- Patient/Caregiver Education Details Patient Name: [name]. Date of Service: 12/02/2016 2:45 PM Medical Record Number: [id number] Patient Account Number: [id number] Date of Birth/Gender: [birth year] (60 y.o. Female) Treating RN: Carolyne Fiscal, Debi Primary Care Physician: PATIENT, NO Other Clinician: Referring Physician: Aldine Contes Treating Physician/Extender: Frann Rider in Treatment: 2 Education Assessment Education Provided To: Patient Education Topics Provided Wound/Skin Impairment: Handouts: Other: change dressing as ordered Methods: Demonstration, Explain/Verbal Responses: State content correctly Electronic Signature(s) Signed: 12/02/2016 4:07:29  PM By: Alric Quan Entered By: Alric Quan on 12/02/2016 15:35:10 [name], [name] ([id number]) -------------------------------------------------------------------------------- Wound Assessment Details Patient Name: [name]. Date of Service: 12/02/2016 2:45 PM Medical Record Number: [id number] Patient Account Number: [id number] Date of Birth/Sex: [birth year] (60 y.o. Female) Treating RN: Carolyne Fiscal, Debi Primary Care Physician: PATIENT, NO Other Clinician: Referring Physician: Aldine Contes Treating Physician/Extender: Frann Rider in Treatment: 2 Wound Status Wound Number: 1 Primary Venous Leg Ulcer Etiology: Wound Location: Right Lower Leg - Anterior Wound Open Wounding Event: Gradually Appeared Status: Date Acquired: 05/19/2016 Comorbid Chronic Obstructive Pulmonary Weeks Of Treatment: 2 History: Disease (COPD), Hypertension, Clustered Wound: No Rheumatoid Arthritis Photos Photo Uploaded By: Alric Quan on 12/02/2016 15:30:49 Wound Measurements Length: (cm) 4.4 Width: (cm) 3.7 Depth: (cm) 0.1 Area: (cm) 12.786 Volume: (cm) 1.279 % Reduction in Area: 28.6% % Reduction in Volume: 28.5% Epithelialization: None Tunneling: No Undermining: No Wound Description Classification: Partial Thickness Wound Margin: Distinct, outline attached Exudate Amount: Large Exudate Type: Purulent Exudate Color: yellow, brown, green Foul Odor After Cleansing: No Wound Bed Granulation Amount: Small (1-33%) Exposed Structure Granulation Quality: Pink Fascia Exposed: No Necrotic Amount: Large (67-100%) Fat Layer Exposed: No Necrotic Quality: Adherent Slough Tendon Exposed: No Wiler, [name] ([id number]) Muscle Exposed: No Joint Exposed: No Bone Exposed: No Limited to Skin Breakdown Periwound Skin Texture Texture Color No Abnormalities Noted: No No Abnormalities Noted: No Localized Edema: Yes Erythema: Yes Erythema Location:  Circumferential Moisture No Abnormalities Noted: No Temperature / Pain Moist: Yes Temperature: No Abnormality Tenderness on Palpation: Yes Wound Preparation Ulcer Cleansing: Rinsed/Irrigated with Saline, Other: soap and water, Topical Anesthetic Applied: Other: lidocaine 4%, Treatment Notes Wound #1 (Right, Anterior Lower Leg) 1. Cleansed with: Clean wound with Normal Saline Cleanse wound with antibacterial soap and water 2. Anesthetic Topical Lidocaine 4% cream to wound bed prior to debridement 4. Dressing Applied: Santyl Ointment 5. Secondary Dressing Applied ABD Pad Dry Gauze 7. Secured with Tape 3 Layer Compression System -  Right Lower Extremity Notes unna to anchor, xtrasorb Electronic Signature(s) Signed: 12/02/2016 4:07:29 PM By: Alric Quan Entered By: Alric Quan on 12/02/2016 15:29:30 [name], [name] ([id number]) -------------------------------------------------------------------------------- Vitals Details Patient Name: [name] Date of Service: 12/02/2016 2:45 PM Medical Record Number: [id number] Patient Account Number: [id number] Date of Birth/Sex: 04-May-1956 (60 y.o. Female) Treating RN: Carolyne Fiscal, Debi Primary Care Physician: PATIENT, NO Other Clinician: Referring Physician: Aldine Contes Treating Physician/Extender: Frann Rider in Treatment: 2 Vital Signs Time Taken: 15:12 Temperature (F): 98.3 Height (in): 63 Pulse (bpm): 100 Weight (lbs): 214.7 Respiratory Rate (breaths/min): 20 Body Mass Index (BMI): 38 Blood Pressure (mmHg): 151/74 Reference Range: 80 - 120 mg / dl Electronic Signature(s) Signed: 12/02/2016 4:07:29 PM By: Alric Quan Entered By: Alric Quan on 12/02/2016 15:12:33

## 2016-12-04 ENCOUNTER — Telehealth: Payer: Self-pay | Admitting: Internal Medicine

## 2016-12-04 NOTE — Telephone Encounter (Signed)
Patient states she can not afford  The ondansetron (ZOFRAN ODT) 8 MG disintegrating tablet medication and would like to know what else can she take.

## 2016-12-05 ENCOUNTER — Telehealth: Payer: Self-pay | Admitting: General Practice

## 2016-12-05 NOTE — Telephone Encounter (Signed)
I am unsure why patient is taking this. It does not appear on her problem list or on her discharge summary from [facility] hospital. It was prescribed by Dr. Vincente Liberty on 12/15. I will route this message to her as well.

## 2016-12-05 NOTE — Telephone Encounter (Signed)
APT. REMINDER CALL, LMTCB °

## 2016-12-06 ENCOUNTER — Ambulatory Visit: Payer: Self-pay

## 2016-12-06 ENCOUNTER — Encounter: Payer: Self-pay | Admitting: Pulmonary Disease

## 2016-12-06 ENCOUNTER — Ambulatory Visit (INDEPENDENT_AMBULATORY_CARE_PROVIDER_SITE_OTHER): Payer: Self-pay | Admitting: Pulmonary Disease

## 2016-12-06 ENCOUNTER — Other Ambulatory Visit: Payer: Self-pay | Admitting: Internal Medicine

## 2016-12-06 VITALS — BP 121/61 | HR 89 | Temp 98.8°F | Ht 62.0 in | Wt 239.6 lb

## 2016-12-06 DIAGNOSIS — F17210 Nicotine dependence, cigarettes, uncomplicated: Secondary | ICD-10-CM

## 2016-12-06 DIAGNOSIS — K432 Incisional hernia without obstruction or gangrene: Secondary | ICD-10-CM

## 2016-12-06 DIAGNOSIS — K430 Incisional hernia with obstruction, without gangrene: Secondary | ICD-10-CM | POA: Insufficient documentation

## 2016-12-06 DIAGNOSIS — Z9049 Acquired absence of other specified parts of digestive tract: Secondary | ICD-10-CM

## 2016-12-06 DIAGNOSIS — R110 Nausea: Secondary | ICD-10-CM

## 2016-12-06 DIAGNOSIS — I878 Other specified disorders of veins: Secondary | ICD-10-CM | POA: Insufficient documentation

## 2016-12-06 DIAGNOSIS — K219 Gastro-esophageal reflux disease without esophagitis: Secondary | ICD-10-CM

## 2016-12-06 DIAGNOSIS — L97919 Non-pressure chronic ulcer of unspecified part of right lower leg with unspecified severity: Secondary | ICD-10-CM

## 2016-12-06 DIAGNOSIS — I872 Venous insufficiency (chronic) (peripheral): Secondary | ICD-10-CM

## 2016-12-06 DIAGNOSIS — R1907 Generalized intra-abdominal and pelvic swelling, mass and lump: Secondary | ICD-10-CM

## 2016-12-06 DIAGNOSIS — M069 Rheumatoid arthritis, unspecified: Secondary | ICD-10-CM

## 2016-12-06 DIAGNOSIS — L97909 Non-pressure chronic ulcer of unspecified part of unspecified lower leg with unspecified severity: Secondary | ICD-10-CM

## 2016-12-06 MED ORDER — PANTOPRAZOLE SODIUM 40 MG PO TBEC: 40.0000 mg | DELAYED_RELEASE_TABLET | Freq: Every day | ORAL | 1 refills | Status: DC

## 2016-12-06 MED ORDER — HYDROCODONE-ACETAMINOPHEN 5-325 MG PO TABS: 1.0000 | ORAL_TABLET | Freq: Two times a day (BID) | ORAL | 0 refills | Status: DC | PRN

## 2016-12-06 NOTE — Assessment & Plan Note (Signed)
Assessment: She has nausea that is moreso at night. She takes over the counter Prilosec with some relief. She denies dysphagia or odynophagia but did have some weight loss prior to starting steroids. Denies blood in her stools. She is a smoker.  Plan: Protonix 40mg  daily Will need eventual referral to GI. Will hold off for now because we are already putting in referrals to two other specialties

## 2016-12-06 NOTE — Assessment & Plan Note (Signed)
She is seeing the wound care center. She has pictures of the wound from her visits. The wound appears to be improving. Given her improvement, will stop her prednisone. Do not need to taper given she has only been taking it for 2 weeks.

## 2016-12-06 NOTE — Progress Notes (Signed)
   CC: nausea  HPI:  [name] is a 60 y.o. woman with history of hypothyroidism, HLD, HTN, RA presenting for follow up of RA and nausea.  She was seen at wound care 12/18. Wound has improved. She finished her dose of prednisone today. No fevers or chills.  She has intermittent nausea more at nights. Her PCP attempted to prescribe her Zofran. She takes Prilosec at night.   She had 10-15 lbs of weight loss over a month and a half but has gained some weight since she started the steroid. No hematochezia or melena. She was constipated. She is taking Miralax. No family history of colon cancer. No dysphagia or odynophagia.  She had an exploratory lap 38 years ago. She has also had cholecystectomy and appendectomy. She is a smoker.    Past Medical History:  Diagnosis Date  . Hashimoto's thyroiditis   . HTN (hypertension)   . Hypercholesteremia   . Hypothyroid   . Rheumatoid arthritis(714.0)     Review of Systems:   No dypsnea No chest pain  Physical Exam:  Vitals:   12/06/16 1337  BP: 121/61  Pulse: 89  Temp: 98.8 F (37.1 C)  TempSrc: Oral  SpO2: 99%  Weight: 239 lb 9.6 oz (108.7 kg)  Height: 5\' 2"  (1.575 m)   General Apperance: NAD HEENT: Normocephalic, atraumatic, anicteric sclera Neck: Supple, trachea midline Lungs: Clear to auscultation bilaterally. No wheezes, rhonchi or rales. Breathing comfortably Heart: Regular rate and rhythm, no murmur/rub/gallop Abdomen: Soft, nontender, nondistended, no rebound/guarding Extremities: Warm and well perfused, 3+ BLE edema Skin: No rashes or lesions Neurologic: Alert and interactive. No gross deficits.   Assessment & Plan:   See Encounters Tab for problem based charting.  Patient discussed with Dr. 

## 2016-12-06 NOTE — Assessment & Plan Note (Signed)
She had an exploratory laparotomy over 30 years ago. She describes a bulge in her right mid abdomen that comes and goes. Occasionally becomes tender to the point that she does not want to try to push on it. On exam she does have a firm mass there. She is unable to get on the exam table at this time due to her leg pain so I was unable to exam her she is supine. It is nontender and she does not have any symptoms of bowel obstruction presently.  Will refer her to general surgery for possible incisional hernia. Do not think that it is presently strangulated.

## 2016-12-06 NOTE — Assessment & Plan Note (Signed)
She was restarted on methotrexate. Recheck CBC and CMP today. Referral placed for rheumatology.

## 2016-12-06 NOTE — Patient Instructions (Addendum)
Take Protonix daily 30 minutes before first meal of day Follow up in 3 weeks

## 2016-12-06 NOTE — Assessment & Plan Note (Signed)
She has venous insufficiency in BLE. She has not been elevating her legs like she had previously. Discussed with her leg elevation and compression stockings. Would hold off on Lasix for now.

## 2016-12-07 LAB — CBC WITH DIFFERENTIAL/PLATELET
BASOS: 0 %
Basophils Absolute: 0 10*3/uL (ref 0.0–0.2)
EOS (ABSOLUTE): 0 10*3/uL (ref 0.0–0.4)
EOS: 0 %
HEMATOCRIT: 37.8 % (ref 34.0–46.6)
HEMOGLOBIN: 12.4 g/dL (ref 11.1–15.9)
Immature Grans (Abs): 0.2 10*3/uL — ABNORMAL HIGH (ref 0.0–0.1)
Immature Granulocytes: 2 %
LYMPHS ABS: 0.9 10*3/uL (ref 0.7–3.1)
Lymphs: 9 %
MCH: 31.2 pg (ref 26.6–33.0)
MCHC: 32.8 g/dL (ref 31.5–35.7)
MCV: 95 fL (ref 79–97)
MONOCYTES: 3 %
MONOS ABS: 0.4 10*3/uL (ref 0.1–0.9)
NEUTROS ABS: 8.8 10*3/uL — AB (ref 1.4–7.0)
Neutrophils: 86 %
Platelets: 200 10*3/uL (ref 150–379)
RBC: 3.97 x10E6/uL (ref 3.77–5.28)
RDW: 16.4 % — ABNORMAL HIGH (ref 12.3–15.4)
WBC: 10.3 10*3/uL (ref 3.4–10.8)

## 2016-12-07 LAB — CMP14 + ANION GAP
ALBUMIN: 3.9 g/dL (ref 3.5–5.5)
ALT: 20 IU/L (ref 0–32)
ANION GAP: 18 mmol/L (ref 10.0–18.0)
AST: 14 IU/L (ref 0–40)
Albumin/Globulin Ratio: 1.7 (ref 1.2–2.2)
Alkaline Phosphatase: 89 IU/L (ref 39–117)
BILIRUBIN TOTAL: 0.5 mg/dL (ref 0.0–1.2)
BUN / CREAT RATIO: 27 — AB (ref 9–23)
BUN: 23 mg/dL (ref 6–24)
CO2: 25 mmol/L (ref 18–29)
CREATININE: 0.86 mg/dL (ref 0.57–1.00)
Calcium: 9 mg/dL (ref 8.7–10.2)
Chloride: 100 mmol/L (ref 96–106)
GFR calc non Af Amer: 74 mL/min/{1.73_m2} (ref >59)
GFR, EST AFRICAN AMERICAN: 86 mL/min/{1.73_m2} (ref >59)
GLOBULIN, TOTAL: 2.3 g/dL (ref 1.5–4.5)
Glucose: 86 mg/dL (ref 65–99)
Potassium: 4.8 mmol/L (ref 3.5–5.2)
SODIUM: 143 mmol/L (ref 134–144)
TOTAL PROTEIN: 6.2 g/dL (ref 6.0–8.5)

## 2016-12-11 NOTE — Progress Notes (Signed)
Internal Medicine Clinic Attending  Case discussed with Dr. Krall soon after the resident saw the patient.  We reviewed the resident's history and exam and pertinent patient test results.  I agree with the assessment, diagnosis, and plan of care documented in the resident's note. 

## 2016-12-12 ENCOUNTER — Encounter: Payer: Self-pay | Admitting: Surgery

## 2016-12-13 NOTE — Progress Notes (Signed)
ROCKELL, FAULKS ([id number]) Visit Report for 12/12/2016 Arrival Information Details Patient Name: [name], [name]. Date of Service: 12/12/2016 2:15 PM Medical Record Number: [id number] Patient Account Number: [id number] Date of Birth/Sex: 1956-09-20 (60 y.o. Female) Treating RN: Cornell Barman Primary Care Physician: PATIENT, NO Other Clinician: Referring Physician: Aldine Contes Treating Physician/Extender: Frann Rider in Treatment: 3 Visit Information History Since Last Visit Added or deleted any medications: No Patient Arrived: Ambulatory Any new allergies or adverse reactions: No Arrival Time: 14:32 Had a fall or experienced change in No Accompanied By: self activities of daily living that may affect Transfer Assistance: None risk of falls: Patient Identification Verified: Yes Signs or symptoms of abuse/neglect since last No Secondary Verification Process Yes visito Completed: Has Dressing in Place as Prescribed: Yes Patient Requires Transmission- No Has Compression in Place as Prescribed: Yes Based Precautions: Pain Present Now: Yes Patient Has Alerts: Yes Patient Alerts: L Leg ABI 1.2 R Leg ABI 0.88 ABIs done in Hospital Notes Patient has cut wrap. Electronic Signature(s) Signed: 12/12/2016 8:14:07 PM By: Gretta Cool, RN, BSN, Kim RN, BSN Entered By: Gretta Cool, RN, BSN, Kim on 12/12/2016 14:32:44 [place], [name] ([id number]) -------------------------------------------------------------------------------- Encounter Discharge Information Details Patient Name: [name]. Date of Service: 12/12/2016 2:15 PM Medical Record Number: [id number] Patient Account Number: [id number] Date of Birth/Sex: 1956-06-24 (60 y.o. Female) Treating RN: Cornell Barman Primary Care Physician: PATIENT, NO Other Clinician: Referring Physician: Aldine Contes Treating Physician/Extender: Frann Rider in Treatment: 3 Encounter Discharge Information Items Discharge Pain  Level: 0 Discharge Condition: Stable Ambulatory Status: Ambulatory Discharge Destination: Home Transportation: Private Auto Accompanied By: self Schedule Follow-up Appointment: Yes Medication Reconciliation completed and provided to Patient/Care Yes Ayahna Solazzo: Provided on Clinical Summary of Care: 12/12/2016 Form Type Recipient Paper Patient JP Electronic Signature(s) Signed: 12/12/2016 3:05:12 PM By: Ruthine Dose Entered By: Ruthine Dose on 12/12/2016 15:05:11 Ducey, [name] ([id number]) -------------------------------------------------------------------------------- Lower Extremity Assessment Details Patient Name: [name]. Date of Service: 12/12/2016 2:15 PM Medical Record Number: [id number] Patient Account Number: [id number] Date of Birth/Sex: 25-Feb-1956 (60 y.o. Female) Treating RN: Cornell Barman Primary Care Physician: PATIENT, NO Other Clinician: Referring Physician: Aldine Contes Treating Physician/Extender: Frann Rider in Treatment: 3 Edema Assessment Assessed: [Left: No] [Right: No] E[Left: dema] [Right: :] Calf Left: Right: Point of Measurement: 32 cm From Medial Instep cm 49 cm Ankle Left: Right: Point of Measurement: 9 cm From Medial Instep cm 31.2 cm Vascular Assessment Claudication: Claudication Assessment [Right:Rest Pain] Pulses: Dorsalis Pedis Palpable: [Right:Yes] Posterior Tibial Extremity colors, hair growth, and conditions: Extremity Color: [Right:Hyperpigmented] Hair Growth on Extremity: [Right:Yes] Temperature of Extremity: [Right:Warm] Capillary Refill: [Right:< 3 seconds] Dependent Rubor: [Right:No] Blanched when Elevated: [Right:No] Lipodermatosclerosis: [Right:No] Toe Nail Assessment Left: Right: Thick: Yes Discolored: Yes Deformed: Yes Improper Length and Hygiene: Yes PHOUA, HOADLEY ([id number]) Electronic Signature(s) Signed: 12/12/2016 8:14:07 PM By: Gretta Cool, RN, BSN, Kim RN, BSN Entered By: Gretta Cool, RN,  BSN, Kim on 12/12/2016 14:41:12 Evola, [name] ([id number]) -------------------------------------------------------------------------------- Multi Wound Chart Details Patient Name: [name]. Date of Service: 12/12/2016 2:15 PM Medical Record Number: [id number] Patient Account Number: [id number] Date of Birth/Sex: August 10, 1956 (60 y.o. Female) Treating RN: Cornell Barman Primary Care Physician: PATIENT, NO Other Clinician: Referring Physician: Aldine Contes Treating Physician/Extender: Frann Rider in Treatment: 3 Vital Signs Height(in): 63 Pulse(bpm): 86 Weight(lbs): 214.7 Blood Pressure 116/69 (mmHg): Body Mass Index(BMI): 38 Temperature(F): 97.9 Respiratory Rate 18 (breaths/min): Photos: [N/A:N/A] Wound Location: Right Lower Leg - Anterior N/A N/A Wounding Event: Gradually  Appeared N/A N/A Primary Etiology: Venous Leg Ulcer N/A N/A Comorbid History: Chronic Obstructive N/A N/A Pulmonary Disease (COPD), Hypertension, Rheumatoid Arthritis Date Acquired: 05/19/2016 N/A N/A Weeks of Treatment: 3 N/A N/A Wound Status: Open N/A N/A Measurements L x W x D 4.5x4x0.1 N/A N/A (cm) Area (cm) : 14.137 N/A N/A Volume (cm) : 1.414 N/A N/A % Reduction in Area: 21.00% N/A N/A % Reduction in Volume: 21.00% N/A N/A Classification: Partial Thickness N/A N/A Exudate Amount: Large N/A N/A Exudate Type: Serous N/A N/A Exudate Color: amber N/A N/A Wound Margin: Distinct, outline attached N/A N/A Granulation Amount: Small (1-33%) N/A N/A Granulation Quality: Pink, Hyper-granulation N/A N/A Necrotic Amount: Large (67-100%) N/A N/A [name]. ([id number]) Exposed Structures: Fascia: No N/A N/A Fat: No Tendon: No Muscle: No Joint: No Bone: No Limited to Skin Breakdown Epithelialization: None N/A N/A Periwound Skin Texture: Edema: Yes N/A N/A Periwound Skin Moist: Yes N/A N/A Moisture: Periwound Skin Color: Erythema: Yes N/A N/A Erythema Location:  Circumferential N/A N/A Temperature: No Abnormality N/A N/A Tenderness on Yes N/A N/A Palpation: Wound Preparation: Ulcer Cleansing: N/A N/A Rinsed/Irrigated with Saline, Other: soap and water Topical Anesthetic Applied: Other: lidocaine 4% Treatment Notes Electronic Signature(s) Signed: 12/12/2016 8:14:07 PM By: Gretta Cool, RN, BSN, Kim RN, BSN Entered By: Gretta Cool, RN, BSN, Kim on 12/12/2016 14:43:53 [place], [name] ([id number]) -------------------------------------------------------------------------------- [place] Details Patient Name: CHAMILLE, WERNTZ. Date of Service: 12/12/2016 2:15 PM Medical Record Number: [id number] Patient Account Number: [id number] Date of Birth/Sex: 1956/01/03 (60 y.o. Female) Treating RN: Cornell Barman Primary Care Physician: PATIENT, NO Other Clinician: Referring Physician: Aldine Contes Treating Physician/Extender: Frann Rider in Treatment: 3 Active Inactive Abuse / Safety / Falls / Self Care Management Nursing Diagnoses: Potential for falls Goals: Patient will remain injury free Date Initiated: 11/18/2016 Goal Status: Active Interventions: Assess fall risk on admission and as needed Assess self care needs on admission and as needed Notes: Nutrition Nursing Diagnoses: Imbalanced nutrition Goals: Patient/caregiver agrees to and verbalizes understanding of need to use nutritional supplements and/or vitamins as prescribed Date Initiated: 11/18/2016 Goal Status: Active Interventions: Assess patient nutrition upon admission and as needed per policy Notes: Orientation to the Wound Care Program Nursing Diagnoses: Knowledge deficit related to the wound healing center program Goals: WILLELLA, HARDING ([id number]) Patient/caregiver will verbalize understanding of the [place] [facility] Date Initiated: 11/18/2016 Goal Status: Active Interventions: Provide education on orientation to the wound  center Notes: Pain, Acute or Chronic Nursing Diagnoses: Pain, acute or chronic: actual or potential Potential alteration in comfort, pain Goals: Patient will verbalize adequate pain control and receive pain control interventions during procedures as needed Date Initiated: 11/18/2016 Goal Status: Active Patient/caregiver will verbalize adequate pain control between visits Date Initiated: 11/18/2016 Goal Status: Active Patient/caregiver will verbalize comfort level met Date Initiated: 11/18/2016 Goal Status: Active Interventions: Assess comfort goal upon admission Complete pain assessment as per visit requirements Notes: Soft Tissue Infection Nursing Diagnoses: Impaired tissue integrity Knowledge deficit related to disease process and management Knowledge deficit related to home infection control: handwashing, handling of soiled dressings, supply storage Goals: Patient/caregiver will verbalize understanding of or measures to prevent infection and contamination in the home setting Date Initiated: 11/18/2016 Goal Status: Active Patient's soft tissue infection will resolve DANIYAH, FOHL ([id number]) Date Initiated: 11/18/2016 Goal Status: Active Interventions: Assess signs and symptoms of infection every visit Provide education on infection Treatment Activities: Education provided on Infection : 11/18/2016 Notes: Wound/Skin Impairment Nursing Diagnoses: Impaired tissue integrity  Goals: Ulcer/skin breakdown will have a volume reduction of 30% by week 4 Date Initiated: 11/18/2016 Goal Status: Active Ulcer/skin breakdown will have a volume reduction of 50% by week 8 Date Initiated: 11/18/2016 Goal Status: Active Ulcer/skin breakdown will have a volume reduction of 80% by week 12 Date Initiated: 11/18/2016 Goal Status: Active Interventions: Assess patient/caregiver ability to perform ulcer/skin care regimen upon admission and as needed Assess ulceration(s) every  visit Provide education on smoking Notes: Electronic Signature(s) Signed: 12/12/2016 8:14:07 PM By: Gretta Cool, RN, BSN, Kim RN, BSN Entered By: Gretta Cool, RN, BSN, Kim on 12/12/2016 14:43:45 Guerin, [name] ([id number]) -------------------------------------------------------------------------------- Pain Assessment Details Patient Name: [name]. Date of Service: 12/12/2016 2:15 PM Medical Record Number: [id number] Patient Account Number: [id number] Date of Birth/Sex: 1956-05-03 (60 y.o. Female) Treating RN: Cornell Barman Primary Care Physician: PATIENT, NO Other Clinician: Referring Physician: Aldine Contes Treating Physician/Extender: Frann Rider in Treatment: 3 Active Problems Location of Pain Severity and Description of Pain Patient Has Paino Yes Site Locations Pain Location: Generalized Pain Rate the pain. Current Pain Level: 7 Character of Pain Describe the Pain: Sharp, Throbbing Pain Management and Medication Current Pain Management: Electronic Signature(s) Signed: 12/12/2016 8:14:07 PM By: Gretta Cool, RN, BSN, Kim RN, BSN Entered By: Gretta Cool, RN, BSN, Kim on 12/12/2016 14:33:07 Charlson, [name] ([id number]) -------------------------------------------------------------------------------- Patient/Caregiver Education Details Patient Name: [name]. Date of Service: 12/12/2016 2:15 PM Medical Record Number: [id number] Patient Account Number: [id number] Date of Birth/Gender: [birth year] (60 y.o. Female) Treating RN: Cornell Barman Primary Care Physician: PATIENT, NO Other Clinician: Referring Physician: Aldine Contes Treating Physician/Extender: Frann Rider in Treatment: 3 Education Assessment Education Provided To: Patient Education Topics Provided Venous: Handouts: Controlling Swelling with Multilayered Compression Wraps, Other: elevate legs above heart Methods: Explain/Verbal Responses: State content correctly Wound/Skin  Impairment: Handouts: Caring for Your Ulcer, Other: wound care as prescribed Electronic Signature(s) Signed: 12/12/2016 8:14:07 PM By: Gretta Cool, RN, BSN, Kim RN, BSN Entered By: Gretta Cool, RN, BSN, Kim on 12/12/2016 15:05:25 [place], [name] ([id number]) -------------------------------------------------------------------------------- Wound Assessment Details Patient Name: [name]. Date of Service: 12/12/2016 2:15 PM Medical Record Number: [id number] Patient Account Number: [id number] Date of Birth/Sex: [birth year] (60 y.o. Female) Treating RN: Cornell Barman Primary Care Physician: PATIENT, NO Other Clinician: Referring Physician: Aldine Contes Treating Physician/Extender: Frann Rider in Treatment: 3 Wound Status Wound Number: 1 Primary Venous Leg Ulcer Etiology: Wound Location: Right Lower Leg - Anterior Wound Open Wounding Event: Gradually Appeared Status: Date Acquired: 05/19/2016 Comorbid Chronic Obstructive Pulmonary Weeks Of Treatment: 3 History: Disease (COPD), Hypertension, Clustered Wound: No Rheumatoid Arthritis Photos Wound Measurements Length: (cm) 4.5 Width: (cm) 4 Depth: (cm) 0.1 Area: (cm) 14.137 Volume: (cm) 1.414 % Reduction in Area: 21% % Reduction in Volume: 21% Epithelialization: None Tunneling: No Undermining: No Wound Description Classification: Partial Thickness Wound Margin: Distinct, outline attached Exudate Amount: Large Exudate Type: Serous Exudate Color: amber Foul Odor After Cleansing: No Wound Bed Granulation Amount: Small (1-33%) Exposed Structure Granulation Quality: Pink, Hyper-granulation Fascia Exposed: No Necrotic Amount: Large (67-100%) Fat Layer Exposed: No Necrotic Quality: Adherent Slough Tendon Exposed: No Muscle Exposed: No Joint Exposed: No Bone Exposed: No Nilson, Joylene K. ([id number]) Limited to Skin Breakdown Periwound Skin Texture Texture Color No Abnormalities Noted: No No Abnormalities Noted:  No Localized Edema: Yes Erythema: Yes Erythema Location: Circumferential Moisture No Abnormalities Noted: No Temperature / Pain Moist: Yes Temperature: No Abnormality Tenderness on Palpation: Yes Wound Preparation Ulcer Cleansing: Rinsed/Irrigated with Saline, Other: soap and water, Topical Anesthetic  Applied: Other: lidocaine 4%, Treatment Notes Wound #1 (Right, Anterior Lower Leg) 1. Cleansed with: Clean wound with Normal Saline 2. Anesthetic Topical Lidocaine 4% cream to wound bed prior to debridement 3. Peri-wound Care: Barrier cream 4. Dressing Applied: Santyl Ointment 7. Secured with 3 Layer Compression System - Right Lower Extremity Notes xtrasorb, ABD Electronic Signature(s) Signed: 12/12/2016 8:14:07 PM By: Gretta Cool, RN, BSN, Kim RN, BSN Entered By: Gretta Cool, RN, BSN, Kim on 12/12/2016 14:43:25 Uyeno, [name] ([id number]) -------------------------------------------------------------------------------- [place] Details Patient Name: [name]. Date of Service: 12/12/2016 2:15 PM Medical Record Number: [id number] Patient Account Number: [id number] Date of Birth/Sex: [birth year] (60 y.o. Female) Treating RN: Cornell Barman Primary Care Physician: PATIENT, NO Other Clinician: Referring Physician: Aldine Contes Treating Physician/Extender: Frann Rider in Treatment: 3 Vital Signs Time Taken: 14:33 Temperature (F): 97.9 Height (in): 63 Pulse (bpm): 86 Weight (lbs): 214.7 Respiratory Rate (breaths/min): 18 Body Mass Index (BMI): 38 Blood Pressure (mmHg): 116/69 Reference Range: 80 - 120 mg / dl Electronic Signature(s) Signed: 12/12/2016 8:14:07 PM By: Gretta Cool, RN, BSN, Kim RN, BSN Entered By: Gretta Cool, RN, BSN, Kim on 12/12/2016 14:33:31

## 2016-12-14 NOTE — Progress Notes (Addendum)
[name] ([id number]) Visit Report for 12/12/2016 Chief Complaint Document Details Patient Name: [name], [name] 12/12/2016 2:15 Date of Service: PM Medical Record [id number] Number: Patient Account Number: [id number] Apr 10, 1956 (60 y.o. Treating RN: Phillis Haggis Date of Birth/Sex: Female) Other Clinician: Primary Care Physician: PATIENT, NO Treating Gurnoor Sloop Referring Physician: Earl Lagos Physician/Extender: Weeks in Treatment: 3 Information Obtained from: Patient Chief Complaint Patient presents for treatment of an open ulcer due to venous insufficiency to her right lower extremity which she's had for over 6 months Electronic Signature(s) Signed: 12/12/2016 2:59:13 PM By: Evlyn Kanner MD, FACS Entered By: Evlyn Kanner on 12/12/2016 14:59:12 [name] ([id number]) -------------------------------------------------------------------------------- Debridement Details Patient Name: [name], [name] 12/12/2016 2:15 Date of Service: PM Medical Record [id number] Number: Patient Account Number: [id number] 04/05/1956 (60 y.o. Treating RN: Huel Coventry Date of Birth/Sex: Female) Other Clinician: Primary Care Physician: PATIENT, NO Treating Denelle Capurro Referring Physician: Earl Lagos Physician/Extender: Tania Ade in Treatment: 3 Debridement Performed for Wound #1 Right,Anterior Lower Leg Assessment: Performed By: Physician Evlyn Kanner, MD Debridement: Debridement Pre-procedure Yes - 14:49 Verification/Time Out Taken: Start Time: 14:50 Pain Control: Other : lidocaine 4% Level: Skin/Subcutaneous Tissue Total Area Debrided (L x 4.5 (cm) x 4 (cm) = 18 (cm) W): Tissue and other Viable, Non-Viable, Fibrin/Slough, Subcutaneous material debrided: Instrument: Curette Bleeding: Moderate Hemostasis Achieved: Pressure End Time: 14:51 Procedural Pain: 3 Post Procedural Pain: 0 Response to Treatment: Procedure was tolerated well Post  Debridement Measurements of Total Wound Length: (cm) 4.5 Width: (cm) 4 Depth: (cm) 0.2 Volume: (cm) 2.827 Character of Wound/Ulcer Post Requires Further Debridement Debridement: Severity of Tissue Post Debridement: Fat layer exposed Post Procedure Diagnosis Same as Pre-procedure Electronic Signature(s) Signed: 12/12/2016 8:21:57 PM By: Elliot Gurney, RN, BSN, Kim RN, BSN Signed: 12/13/2016 4:27:48 PM By: Evlyn Kanner MD, FACS [name], [name] ([id number]) Previous Signature: 12/12/2016 2:59:08 PM Version By: Evlyn Kanner MD, FACS Entered By: Elliot Gurney RN, BSN, Kim on 12/12/2016 20:21:57 [name], [name] ([id number]) -------------------------------------------------------------------------------- HPI Details Patient Name: [name] 12/12/2016 2:15 Date of Service: PM Medical Record [id number] Number: Patient Account Number: [id number] 25-Jul-1956 (60 y.o. Treating RN: Phillis Haggis Date of Birth/Sex: Female) Other Clinician: Primary Care Physician: PATIENT, NO Treating Kenaz Olafson Referring Physician: Earl Lagos Physician/Extender: Weeks in Treatment: 3 History of Present Illness Location: ulcerated area on the right lower extremity Quality: Patient reports experiencing a sharp pain to affected area(s). Severity: Patient states wound are getting worse. Duration: Patient has had the wound for > 6 months prior to seeking treatment at the wound center Timing: Pain in wound is constant (hurts all the time) Context: The wound appeared gradually over time Modifying Factors: Other treatment(s) tried include:recent admission to hospital for a cellulitis and was treated with antibiotics and an Unna boot Associated Signs and Symptoms: Patient reports having increase swelling. HPI Description: 60 year old patient who was recently admitted to the hospital between 11/14/2016 and 11/15/2016 for right lower extremity ulcer with cellulitis. she was asked to stop clindamycin and  doxycycline and her prednisone and take Levaquin 500 mg daily by mouth. she was treated for a right lower extremity possibly due to venous stasis versus vascular insufficiency versus infection. She received IV vancomycin during her hospital stay. He is known to have rheumatoid arthritis and stopped taking her treatment because of lack of insurance. during her hospital stay vascular studies were done -- ABIs were 0.88 on the right and 1.1 on the left, she had triphasic flow on both lower extremities and a  plain film of the right tibia and fibula showed no signs of osteomyelitis. She is not a diabetic. She is a smoker however and continued to smoke. lab work noted that her sedimentation rate was 29, CRP was less than 0.8, hemoglobin A1c was 5.2 12/02/2016 -- due to a family emergency and social economic reasons she has been unable to keep her appointment with the vascular department for her workup and is expecting insurance coverage after January 1 Electronic Signature(s) Signed: 12/12/2016 2:59:23 PM By: Evlyn Kanner MD, FACS Entered By: Evlyn Kanner on 12/12/2016 14:59:23 [name] ([id number]) -------------------------------------------------------------------------------- Physical Exam Details Patient Name: [name], [name] 12/12/2016 2:15 Date of Service: PM Medical Record [id number] Number: Patient Account Number: [id number] Dec 23, 1955 (60 y.o. Treating RN: Phillis Haggis Date of Birth/Sex: Female) Other Clinician: Primary Care Physician: PATIENT, NO Treating Ellionna Buckbee Referring Physician: Earl Lagos Physician/Extender: Weeks in Treatment: 3 Constitutional . Pulse regular. Respirations normal and unlabored. Afebrile. . Eyes Nonicteric. Reactive to light. Ears, Nose, Mouth, and Throat Lips, teeth, and gums WNL.Marland Kitchen Moist mucosa without lesions. Neck supple and nontender. No palpable supraclavicular or cervical adenopathy. Normal sized without  goiter. Respiratory WNL. No retractions.. Cardiovascular Pedal Pulses WNL. No clubbing, cyanosis or edema. Lymphatic No adneopathy. No adenopathy. No adenopathy. Musculoskeletal Adexa without tenderness or enlargement.. Digits and nails w/o clubbing, cyanosis, infection, petechiae, ischemia, or inflammatory conditions.. Integumentary (Hair, Skin) No suspicious lesions. No crepitus or fluctuance. No peri-wound warmth or erythema. No masses.Marland Kitchen Psychiatric Judgement and insight Intact.. No evidence of depression, anxiety, or agitation.. Notes the patient has significant lymphedema and has not been wearing her compression properly. Ulceration has subcutaneous debris which have sharply removed with a #3 curet and bleeding controlled with pressure Electronic Signature(s) Signed: 12/12/2016 3:00:09 PM By: Evlyn Kanner MD, FACS Entered By: Evlyn Kanner on 12/12/2016 15:00:08 [place], [name] ([id number]) -------------------------------------------------------------------------------- Physician Orders Details Patient Name: [name], [name] 12/12/2016 2:15 Date of Service: PM Medical Record [id number] Number: Patient Account Number: [id number] 10/06/1956 (60 y.o. Treating RN: Huel Coventry Date of Birth/Sex: Female) Other Clinician: Primary Care Physician: PATIENT, NO Treating Naiyah Klostermann Referring Physician: Earl Lagos Physician/Extender: Tania Ade in Treatment: 3 Verbal / Phone Orders: Yes Clinician: Huel Coventry Read Back and Verified: Yes Diagnosis Coding ICD-10 Coding Code Description 8108477570 Non-pressure chronic ulcer of right calf with fat layer exposed I83.012 Varicose veins of right lower extremity with ulcer of calf F17.218 Nicotine dependence, cigarettes, with other nicotine-induced disorders M05.60 Rheumatoid arthritis of unspecified site with involvement of other organs and systems Wound Cleansing Wound #1 Right,Anterior Lower Leg o Clean wound with Normal  Saline. o Cleanse wound with mild soap and water Anesthetic Wound #1 Right,Anterior Lower Leg o Topical Lidocaine 4% cream applied to wound bed prior to debridement Primary Wound Dressing Wound #1 Right,Anterior Lower Leg o Santyl Ointment Secondary Dressing Wound #1 Right,Anterior Lower Leg o ABD pad o XtraSorb Dressing Change Frequency Wound #1 Right,Anterior Lower Leg o Change dressing every week Follow-up Appointments Wound #1 Right,Anterior Lower Leg o Return Appointment in 1 week. [name], [name] ([id number]) Edema Control Wound #1 Right,Anterior Lower Leg o 3 Layer Compression System - Right Lower Extremity Additional Orders / Instructions Wound #1 Right,Anterior Lower Leg o Stop Smoking o Increase protein intake. Medications-please add to medication list. Wound #1 Right,Anterior Lower Leg o Other: - Vitamin C, Zinc, Vitamin A, MVI Electronic Signature(s) Signed: 12/12/2016 4:37:15 PM By: Evlyn Kanner MD, FACS Signed: 12/12/2016 8:14:07 PM By: Elliot Gurney RN, BSN, Kim RN, BSN Entered  By: Elliot Gurney, RN, BSN, Kim on 12/12/2016 15:03:34 [place], [name] ([id number]) -------------------------------------------------------------------------------- Problem List Details Patient Name: [name], [name] 12/12/2016 2:15 Date of Service: PM Medical Record [id number] Number: Patient Account Number: [id number] 1956-02-17 (60 y.o. Treating RN: Phillis Haggis Date of Birth/Sex: Female) Other Clinician: Primary Care Physician: PATIENT, NO Treating Generoso Cropper Referring Physician: Earl Lagos Physician/Extender: Weeks in Treatment: 3 Active Problems ICD-10 Encounter Code Description Active Date Diagnosis 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 layer 11/18/2016 Yes exposed I83.012 Varicose veins of right lower extremity with ulcer of calf 11/18/2016 Yes F17.218 Nicotine dependence, cigarettes, with other nicotine- 11/18/2016  Yes induced disorders M05.60 Rheumatoid arthritis of unspecified site with involvement 11/18/2016 Yes of other organs and systems Inactive Problems Resolved Problems Electronic Signature(s) Signed: 12/12/2016 2:58:52 PM By: Evlyn Kanner MD, FACS Entered By: Evlyn Kanner on 12/12/2016 14:58:51 [name], [name] ([id number]) -------------------------------------------------------------------------------- Progress Note Details Patient Name: [name] 12/12/2016 2:15 Date of Service: PM Medical Record [id number] Number: Patient Account Number: [id number] 07-May-1956 (60 y.o. Treating RN: Phillis Haggis Date of Birth/Sex: Female) Other Clinician: Primary Care Physician: PATIENT, NO Treating Dorothe Elmore Referring Physician: Earl Lagos Physician/Extender: Weeks in Treatment: 3 Subjective Chief Complaint Information obtained from Patient Patient presents for treatment of an open ulcer due to venous insufficiency to her right lower extremity which she's had for over 6 months History of Present Illness (HPI) The following HPI elements were documented for the patient's wound: Location: ulcerated area on the right lower extremity Quality: Patient reports experiencing a sharp pain to affected area(s). Severity: Patient states wound are getting worse. Duration: Patient has had the wound for > 6 months prior to seeking treatment at the wound center Timing: Pain in wound is constant (hurts all the time) Context: The wound appeared gradually over time Modifying Factors: Other treatment(s) tried include:recent admission to hospital for a cellulitis and was treated with antibiotics and an Unna boot Associated Signs and Symptoms: Patient reports having increase swelling. 60 year old patient who was recently admitted to the hospital between 11/14/2016 and 11/15/2016 for right lower extremity ulcer with cellulitis. she was asked to stop clindamycin and doxycycline and her  prednisone and take Levaquin 500 mg daily by mouth. she was treated for a right lower extremity possibly due to venous stasis versus vascular insufficiency versus infection. She received IV vancomycin during her hospital stay. He is known to have rheumatoid arthritis and stopped taking her treatment because of lack of insurance. during her hospital stay vascular studies were done -- ABIs were 0.88 on the right and 1.1 on the left, she had triphasic flow on both lower extremities and a plain film of the right tibia and fibula showed no signs of osteomyelitis. She is not a diabetic. She is a smoker however and continued to smoke. lab work noted that her sedimentation rate was 29, CRP was less than 0.8, hemoglobin A1c was 5.2 12/02/2016 -- due to a family emergency and social economic reasons she has been unable to keep her appointment with the vascular department for her workup and is expecting insurance coverage after January 1 [name], [name]. ([id number]) Objective Constitutional Pulse regular. Respirations normal and unlabored. Afebrile. Vitals Time Taken: 2:33 PM, Height: 63 in, Weight: 214.7 lbs, BMI: 38, Temperature: 97.9 F, Pulse: 86 bpm, Respiratory Rate: 18 breaths/min, Blood Pressure: 116/69 mmHg. Eyes Nonicteric. Reactive to light. Ears, Nose, Mouth, and Throat Lips, teeth, and gums WNL.Marland Kitchen Moist mucosa without lesions. Neck supple and nontender. No palpable supraclavicular  or cervical adenopathy. Normal sized without goiter. Respiratory WNL. No retractions.. Cardiovascular Pedal Pulses WNL. No clubbing, cyanosis or edema. Lymphatic No adneopathy. No adenopathy. No adenopathy. Musculoskeletal Adexa without tenderness or enlargement.. Digits and nails w/o clubbing, cyanosis, infection, petechiae, ischemia, or inflammatory conditions.Marland Kitchen. Psychiatric Judgement and insight Intact.. No evidence of depression, anxiety, or agitation.. General Notes: the patient has significant  lymphedema and has not been wearing her compression properly. Ulceration has subcutaneous debris which have sharply removed with a #3 curet and bleeding controlled with pressure Integumentary (Hair, Skin) No suspicious lesions. No crepitus or fluctuance. No peri-wound warmth or erythema. No masses.. Wound #1 status is Open. Original cause of wound was Gradually Appeared. The wound is located on the Right,Anterior Lower Leg. The wound measures 4.5cm length x 4cm width x 0.1cm depth; 14.137cm^2 area and 1.414cm^3 volume. The wound is limited to skin breakdown. There is no tunneling or undermining noted. There is a large amount of serous drainage noted. The wound margin is distinct with the outline attached to the wound base. There is small (1-33%) pink granulation within the wound bed. There is a large (67-100%) amount of necrotic tissue within the wound bed including Adherent Slough. The periwound skin appearance exhibited: Localized Edema, Moist, Erythema. The surrounding wound skin color is noted with Govan, Neil K. ([id number]) erythema which is circumferential. Periwound temperature was noted as No Abnormality. The periwound has tenderness on palpation. Assessment Active Problems ICD-10 L97.212 - Non-pressure chronic ulcer of right calf with fat layer exposed I83.012 - Varicose veins of right lower extremity with ulcer of calf F17.218 - Nicotine dependence, cigarettes, with other nicotine-induced disorders M05.60 - Rheumatoid arthritis of unspecified site with involvement of other organs and systems Procedures Wound #1 Wound #1 is a Venous Leg Ulcer located on the Right,Anterior Lower Leg . There was a Skin/Subcutaneous Tissue Debridement [id number]) debridement with total area of 18 sq cm performed by Evlyn KannerBritto, Lindsea Olivar, MD. with the following instrument(s): Curette to remove Viable and Non-Viable tissue/material including Fibrin/Slough and Subcutaneous after achieving pain control  using Other (lidocaine 4%). A time out was conducted at 14:49, prior to the start of the procedure. A Moderate amount of bleeding was controlled with Pressure. The procedure was tolerated well with a pain level of 3 throughout and a pain level of 0 following the procedure. Post Debridement Measurements: 4.5cm length x 4cm width x 0.2cm depth; 2.827cm^3 volume. Character of Wound/Ulcer Post Debridement requires further debridement. Severity of Tissue Post Debridement is: Fat layer exposed. Post procedure Diagnosis Wound #1: Same as Pre-Procedure Plan Wound Cleansing: Wound #1 Right,Anterior Lower Leg: Clean wound with Normal Saline. Cleanse wound with mild soap and water Anesthetic: Wound #1 Right,Anterior Lower Leg: Marilyn MinsRUITT, [name] ([id number]) Topical Lidocaine 4% cream applied to wound bed prior to debridement Primary Wound Dressing: Wound #1 Right,Anterior Lower Leg: Santyl Ointment Secondary Dressing: Wound #1 Right,Anterior Lower Leg: ABD pad XtraSorb Dressing Change Frequency: Wound #1 Right,Anterior Lower Leg: Change dressing every week Follow-up Appointments: Wound #1 Right,Anterior Lower Leg: Return Appointment in 1 week. Edema Control: Wound #1 Right,Anterior Lower Leg: 3 Layer Compression System - Right Lower Extremity Additional Orders / Instructions: Wound #1 Right,Anterior Lower Leg: Stop Smoking Increase protein intake. Medications-please add to medication list.: Wound #1 Right,Anterior Lower Leg: Other: - Vitamin C, Zinc, Vitamin A, MVI I have recommended: 1. Santyl ointment and a 3 layer Profore compression to be changed every week 2. Elevation and exercise 3. venous duplex studies of both lower extremity to  be done soon as her insurance coverage is achieved -- appointment still pending 4. I have urged her to completely give up smoking. 5. regular visits to the wound center Electronic Signature(s) Signed: 12/13/2016 4:38:27 PM By: Evlyn Kanner MD,  FACS Previous Signature: 12/12/2016 3:00:30 PM Version By: Evlyn Kanner MD, FACS Entered By: Evlyn Kanner on 12/13/2016 16:38:27 [place], [name] ([id number]) -------------------------------------------------------------------------------- SuperBill Details Patient Name: [name]. Date of Service: 12/12/2016 Medical Record Number: [id number] Patient Account Number: [id number] Date of Birth/Sex: [birth year] (60 y.o. Female) Treating RN: Ashok Cordia, Debi Primary Care Physician: PATIENT, NO Other Clinician: Referring Physician: Earl Lagos Treating Physician/Extender: Rudene Re in Treatment: 3 Diagnosis Coding ICD-10 Codes Code Description [id number] Non-pressure chronic ulcer of right calf with fat layer exposed I83.012 Varicose veins of right lower extremity with ulcer of calf F17.218 Nicotine dependence, cigarettes, with other nicotine-induced disorders M05.60 Rheumatoid arthritis of unspecified site with involvement of other organs and systems Facility Procedures CPT4: Description Modifier Quantity Code [id number] 11042 - DEB SUBQ TISSUE 20 SQ CM/< 1 ICD-10 Description Diagnosis 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 layer exposed I83.012 Varicose veins of right lower extremity with ulcer of calf  F17.218 Nicotine dependence, cigarettes, with other nicotine-induced disorders M05.60 Rheumatoid arthritis of unspecified site with involvement of other organs and systems Physician Procedures CPT4: Description Modifier Quantity Code 1324401 11042 - WC PHYS SUBQ TISS 20 SQ CM 1 ICD-10 Description Diagnosis 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 layer exposed I83.012 Varicose veins of right lower extremity with ulcer of calf  F17.218 Nicotine dependence, cigarettes, with other nicotine-induced disorders M05.60 Rheumatoid arthritis of unspecified site with involvement of other organs and systems Electronic Signature(s) Signed: 12/13/2016  4:38:35 PM By: Evlyn Kanner MD, FACS Previous Signature: 12/12/2016 3:01:01 PM Version By: Evlyn Kanner MD, FACS CHINARA, CUMBA ([id number]) Entered By: Evlyn Kanner on 12/13/2016 16:38:35

## 2016-12-19 ENCOUNTER — Encounter: Payer: BLUE CROSS/BLUE SHIELD | Attending: Surgery | Admitting: Surgery

## 2016-12-19 DIAGNOSIS — F17218 Nicotine dependence, cigarettes, with other nicotine-induced disorders: Secondary | ICD-10-CM | POA: Diagnosis not present

## 2016-12-19 DIAGNOSIS — J449 Chronic obstructive pulmonary disease, unspecified: Secondary | ICD-10-CM | POA: Insufficient documentation

## 2016-12-19 DIAGNOSIS — I10 Essential (primary) hypertension: Secondary | ICD-10-CM | POA: Insufficient documentation

## 2016-12-19 DIAGNOSIS — I83012 Varicose veins of right lower extremity with ulcer of calf: Secondary | ICD-10-CM | POA: Diagnosis not present

## 2016-12-19 DIAGNOSIS — M0560 Rheumatoid arthritis of unspecified site with involvement of other organs and systems: Secondary | ICD-10-CM | POA: Insufficient documentation

## 2016-12-19 DIAGNOSIS — L97212 Non-pressure chronic ulcer of right calf with fat layer exposed: Secondary | ICD-10-CM | POA: Insufficient documentation

## 2016-12-19 DIAGNOSIS — Z79899 Other long term (current) drug therapy: Secondary | ICD-10-CM | POA: Diagnosis not present

## 2016-12-19 DIAGNOSIS — Z880 Allergy status to penicillin: Secondary | ICD-10-CM | POA: Diagnosis not present

## 2016-12-19 NOTE — Progress Notes (Addendum)
FLOYCE, BUJAK ([id number]) Visit Report for 12/19/2016 Chief Complaint Document Details Patient Name: KELYN, PONCIANO. Date of Service: 12/19/2016 8:15 AM Medical Record Number: [id number] Patient Account Number: [id number] Date of Birth/Sex: 11-03-1956 (61 y.o. Female) Treating RN: Huel Coventry Primary Care Physician: PATIENT, NO Other Clinician: Referring Physician: Earl Lagos Treating Physician/Extender: Rudene Re in Treatment: 4 Information Obtained from: Patient Chief Complaint Patient presents for treatment of an open ulcer due to venous insufficiency to her right lower extremity which she's had for over 6 months Electronic Signature(s) Signed: 12/19/2016 8:55:07 AM By: Evlyn Kanner MD, FACS Entered By: Evlyn Kanner on 12/19/2016 08:55:07 Drewes, [name] ([id number]) -------------------------------------------------------------------------------- HPI Details Patient Name: [name]. Date of Service: 12/19/2016 8:15 AM Medical Record Number: [id number] Patient Account Number: [id number] Date of Birth/Sex: 07-04-1956 (61 y.o. Female) Treating RN: Huel Coventry Primary Care Physician: PATIENT, NO Other Clinician: Referring Physician: Earl Lagos Treating Physician/Extender: Rudene Re in Treatment: 4 History of Present Illness Location: ulcerated area on the right lower extremity Quality: Patient reports experiencing a sharp pain to affected area(s). Severity: Patient states wound are getting worse. Duration: Patient has had the wound for > 6 months prior to seeking treatment at the wound center Timing: Pain in wound is constant (hurts all the time) Context: The wound appeared gradually over time Modifying Factors: Other treatment(s) tried include:recent admission to hospital for a cellulitis and was treated with antibiotics and an Unna boot Associated Signs and Symptoms: Patient reports having increase swelling. HPI Description:  61 year old patient who was recently admitted to the hospital between 11/14/2016 and 11/15/2016 for right lower extremity ulcer with cellulitis. she was asked to stop clindamycin and doxycycline and her prednisone and take Levaquin 500 mg daily by mouth. she was treated for a right lower extremity possibly due to venous stasis versus vascular insufficiency versus infection. She received IV vancomycin during her hospital stay. He is known to have rheumatoid arthritis and stopped taking her treatment because of lack of insurance. during her hospital stay vascular studies were done -- ABIs were 0.88 on the right and 1.1 on the left, she had triphasic flow on both lower extremities and a plain film of the right tibia and fibula showed no signs of osteomyelitis. She is not a diabetic. She is a smoker however and continued to smoke. lab work noted that her sedimentation rate was 29, CRP was less than 0.8, hemoglobin A1c was 5.2 12/02/2016 -- due to a family emergency and social economic reasons she has been unable to keep her appointment with the vascular department for her workup and is expecting insurance coverage after January 1 12/19/2016 -- she continues to smoke. As have new insurance and hopefully will get her vascular test done as soon as possible. Electronic Signature(s) Signed: 12/19/2016 8:55:29 AM By: Evlyn Kanner MD, FACS Entered By: Evlyn Kanner on 12/19/2016 08:55:29 [place], [name] ([id number]) -------------------------------------------------------------------------------- Physical Exam Details Patient Name: JERSI, MCMASTER. Date of Service: 12/19/2016 8:15 AM Medical Record Number: [id number] Patient Account Number: [id number] Date of Birth/Sex: Mar 06, 1956 (61 y.o. Female) Treating RN: Huel Coventry Primary Care Physician: PATIENT, NO Other Clinician: Referring Physician: Earl Lagos Treating Physician/Extender: Rudene Re in Treatment: 4 Constitutional . Pulse  regular. Respirations normal and unlabored. Afebrile. . Eyes Nonicteric. Reactive to light. Ears, Nose, Mouth, and Throat Lips, teeth, and gums WNL.Marland Kitchen Moist mucosa without lesions. Neck supple and nontender. No palpable supraclavicular or cervical adenopathy. Normal sized without goiter. Respiratory WNL. No retractions.. Breath sounds  WNL, No rubs, rales, rhonchi, or wheeze.. Cardiovascular Heart rhythm and rate regular, no murmur or gallop.. Pedal Pulses WNL. No clubbing, cyanosis or edema. Chest Breasts symmetical and no nipple discharge.. Breast tissue WNL, no masses, lumps, or tenderness.. Lymphatic No adneopathy. No adenopathy. No adenopathy. Musculoskeletal Adexa without tenderness or enlargement.. Digits and nails w/o clubbing, cyanosis, infection, petechiae, ischemia, or inflammatory conditions.. Integumentary (Hair, Skin) No suspicious lesions. No crepitus or fluctuance. No peri-wound warmth or erythema. No masses.Marland Kitchen Psychiatric Judgement and insight Intact.. No evidence of depression, anxiety, or agitation.. Notes the wrap had slipped and she has got extensive edema below her knee up to where the wrap was in the mid lower extremity. The wound itself looks better no sharp debridement was done today has she has significant tenderness. Electronic Signature(s) Signed: 12/19/2016 8:55:56 AM By: Evlyn Kanner MD, FACS Entered By: Evlyn Kanner on 12/19/2016 08:55:56 Swearengin, [name] ([id number]) -------------------------------------------------------------------------------- Physician Orders Details Patient Name: EMMANI, LESUEUR. Date of Service: 12/19/2016 8:15 AM Medical Record Number: [id number] Patient Account Number: [id number] Date of Birth/Sex: [birth year] (61 y.o. Female) Treating RN: Huel Coventry Primary Care Physician: PATIENT, NO Other Clinician: Referring Physician: Earl Lagos Treating Physician/Extender: Rudene Re in Treatment: 4 Verbal / Phone  Orders: No Diagnosis Coding ICD-10 Coding Code Description [phone] Non-pressure chronic ulcer of right calf with fat layer exposed I83.012 Varicose veins of right lower extremity with ulcer of calf F17.218 Nicotine dependence, cigarettes, with other nicotine-induced disorders M05.60 Rheumatoid arthritis of unspecified site with involvement of other organs and systems Wound Cleansing Wound #1 Right,Anterior Lower Leg o Clean wound with Normal Saline. o Cleanse wound with mild soap and water Anesthetic Wound #1 Right,Anterior Lower Leg o Topical Lidocaine 4% cream applied to wound bed prior to debridement Primary Wound Dressing Wound #1 Right,Anterior Lower Leg o Santyl Ointment Secondary Dressing Wound #1 Right,Anterior Lower Leg o ABD pad o XtraSorb Dressing Change Frequency Wound #1 Right,Anterior Lower Leg o Change dressing every week - Nurse visit as needed. Follow-up Appointments Wound #1 Right,Anterior Lower Leg o Return Appointment in 1 week. o Nurse Visit as needed CHRISTIANNA, BELMONTE ([id number]) Edema Control Wound #1 Right,Anterior Lower Leg o 3 Layer Compression System - Right Lower Extremity Additional Orders / Instructions Wound #1 Right,Anterior Lower Leg o Stop Smoking o Increase protein intake. Medications-please add to medication list. Wound #1 Right,Anterior Lower Leg o Other: - Vitamin C, Zinc, Vitamin A, MVI Services and Therapies o Venous Studies -Unilateral - Right Electronic Signature(s) Signed: 12/19/2016 3:27:10 PM By: Evlyn Kanner MD, FACS Signed: 12/19/2016 4:15:25 PM By: Elliot Gurney RN, BSN, Kim RN, BSN Entered By: Elliot Gurney, RN, BSN, Kim on 12/19/2016 09:08:38 Kellett, [name] ([id number]) -------------------------------------------------------------------------------- Problem List Details Patient Name: BERENIS, CORTER. Date of Service: 12/19/2016 8:15 AM Medical Record Number: [id number] Patient Account Number:  [id number] Date of Birth/Sex: 1956-03-05 (61 y.o. Female) Treating RN: Huel Coventry Primary Care Physician: PATIENT, NO Other Clinician: Referring Physician: Earl Lagos Treating Physician/Extender: Rudene Re in Treatment: 4 Active Problems ICD-10 Encounter Code Description Active Date Diagnosis 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 layer 11/18/2016 Yes exposed I83.012 Varicose veins of right lower extremity with ulcer of calf 11/18/2016 Yes F17.218 Nicotine dependence, cigarettes, with other nicotine- 11/18/2016 Yes induced disorders M05.60 Rheumatoid arthritis of unspecified site with involvement 11/18/2016 Yes of other organs and systems Inactive Problems Resolved Problems Electronic Signature(s) Signed: 12/19/2016 8:54:51 AM By: Evlyn Kanner MD, FACS Entered By: Evlyn Kanner on 12/19/2016 08:54:51 Prosser,  [name] ([id number]) -------------------------------------------------------------------------------- Progress Note Details Patient Name: GENELDA, GIAMBRA. Date of Service: 12/19/2016 8:15 AM Medical Record Number: [id number] Patient Account Number: [id number] Date of Birth/Sex: 11/29/1956 (61 y.o. Female) Treating RN: Huel Coventry Primary Care Physician: PATIENT, NO Other Clinician: Referring Physician: Earl Lagos Treating Physician/Extender: Rudene Re in Treatment: 4 Subjective Chief Complaint Information obtained from Patient Patient presents for treatment of an open ulcer due to venous insufficiency to her right lower extremity which she's had for over 6 months History of Present Illness (HPI) The following HPI elements were documented for the patient's wound: Location: ulcerated area on the right lower extremity Quality: Patient reports experiencing a sharp pain to affected area(s). Severity: Patient states wound are getting worse. Duration: Patient has had the wound for > 6 months prior to seeking treatment at the  wound center Timing: Pain in wound is constant (hurts all the time) Context: The wound appeared gradually over time Modifying Factors: Other treatment(s) tried include:recent admission to hospital for a cellulitis and was treated with antibiotics and an Unna boot Associated Signs and Symptoms: Patient reports having increase swelling. 61 year old patient who was recently admitted to the hospital between 11/14/2016 and 11/15/2016 for right lower extremity ulcer with cellulitis. she was asked to stop clindamycin and doxycycline and her prednisone and take Levaquin 500 mg daily by mouth. she was treated for a right lower extremity possibly due to venous stasis versus vascular insufficiency versus infection. She received IV vancomycin during her hospital stay. He is known to have rheumatoid arthritis and stopped taking her treatment because of lack of insurance. during her hospital stay vascular studies were done -- ABIs were 0.88 on the right and 1.1 on the left, she had triphasic flow on both lower extremities and a plain film of the right tibia and fibula showed no signs of osteomyelitis. She is not a diabetic. She is a smoker however and continued to smoke. lab work noted that her sedimentation rate was 29, CRP was less than 0.8, hemoglobin A1c was 5.2 12/02/2016 -- due to a family emergency and social economic reasons she has been unable to keep her appointment with the vascular department for her workup and is expecting insurance coverage after January 1 12/19/2016 -- she continues to smoke. As have new insurance and hopefully will get her vascular test done as soon as possible. ARLINGTON, RILE ([id number]) Objective Constitutional Pulse regular. Respirations normal and unlabored. Afebrile. Vitals Time Taken: 8:35 AM, Height: 63 in, Weight: 214.7 lbs, BMI: 38, Temperature: 97.8 F, Pulse: 75 bpm, Respiratory Rate: 16 breaths/min, Blood Pressure: 157/84 mmHg. Eyes Nonicteric. Reactive  to light. Ears, Nose, Mouth, and Throat Lips, teeth, and gums WNL.Marland Kitchen Moist mucosa without lesions. Neck supple and nontender. No palpable supraclavicular or cervical adenopathy. Normal sized without goiter. Respiratory WNL. No retractions.. Breath sounds WNL, No rubs, rales, rhonchi, or wheeze.. Cardiovascular Heart rhythm and rate regular, no murmur or gallop.. Pedal Pulses WNL. No clubbing, cyanosis or edema. Chest Breasts symmetical and no nipple discharge.. Breast tissue WNL, no masses, lumps, or tenderness.. Lymphatic No adneopathy. No adenopathy. No adenopathy. Musculoskeletal Adexa without tenderness or enlargement.. Digits and nails w/o clubbing, cyanosis, infection, petechiae, ischemia, or inflammatory conditions.Marland Kitchen Psychiatric Judgement and insight Intact.. No evidence of depression, anxiety, or agitation.. General Notes: the wrap had slipped and she has got extensive edema below her knee up to where the wrap was in the mid lower extremity. The wound itself looks better no sharp debridement was done today  has she has significant tenderness. Integumentary (Hair, Skin) No suspicious lesions. No crepitus or fluctuance. No peri-wound warmth or erythema. No masses.. Wound #1 status is Open. Original cause of wound was Gradually Appeared. The wound is located on the Right,Anterior Lower Leg. The wound measures 3cm length x 3.2cm width x 0.1cm depth; 7.54cm^2 area Christmas, Yoshiye K. ([id number]) and 0.754cm^3 volume. There is fat exposed. There is no tunneling or undermining noted. There is a large amount of serous drainage noted. The wound margin is distinct with the outline attached to the wound base. There is medium (34-66%) pink granulation within the wound bed. There is a medium (34-66%) amount of necrotic tissue within the wound bed including Adherent Slough. The periwound skin appearance exhibited: Localized Edema, Moist, Ecchymosis, Erythema. The surrounding wound skin color is  noted with erythema which is circumferential. Periwound temperature was noted as No Abnormality. The periwound has tenderness on palpation. Assessment Active Problems ICD-10 L97.212 - Non-pressure chronic ulcer of right calf with fat layer exposed I83.012 - Varicose veins of right lower extremity with ulcer of calf F17.218 - Nicotine dependence, cigarettes, with other nicotine-induced disorders M05.60 - Rheumatoid arthritis of unspecified site with involvement of other organs and systems Plan Wound Cleansing: Wound #1 Right,Anterior Lower Leg: Clean wound with Normal Saline. Cleanse wound with mild soap and water Anesthetic: Wound #1 Right,Anterior Lower Leg: Topical Lidocaine 4% cream applied to wound bed prior to debridement Primary Wound Dressing: Wound #1 Right,Anterior Lower Leg: Santyl Ointment Secondary Dressing: Wound #1 Right,Anterior Lower Leg: ABD pad XtraSorb Dressing Change Frequency: Wound #1 Right,Anterior Lower Leg: Change dressing every week - Nurse visit as needed. Follow-up Appointments: Wound #1 Right,Anterior Lower Leg: Return Appointment in 1 week. Nurse Visit as needed Edema Control: ARITZEL, KRUSEMARK ([id number]) Wound #1 Right,Anterior Lower Leg: 3 Layer Compression System - Right Lower Extremity Additional Orders / Instructions: Wound #1 Right,Anterior Lower Leg: Stop Smoking Increase protein intake. Medications-please add to medication list.: Wound #1 Right,Anterior Lower Leg: Other: - Vitamin C, Zinc, Vitamin A, MVI Services and Therapies ordered were: Venous Studies -Unilateral - Right I have recommended: 1. Santyl ointment and a 3 layer Profore compression to be changed every week 2. Elevation and exercise 3. venous duplex studies of both lower extremity to be done soon as her insurance coverage is achieved -- appointment still pending 4. I have urged her to completely give up smoking. 5. regular visits to the wound center Electronic  Signature(s) Signed: 12/19/2016 3:29:40 PM By: Evlyn Kanner MD, FACS Previous Signature: 12/19/2016 8:56:11 AM Version By: Evlyn Kanner MD, FACS Entered By: Evlyn Kanner on 12/19/2016 15:29:39 [place], [name] ([id number]) -------------------------------------------------------------------------------- SuperBill Details Patient Name: [name]. Date of Service: 12/19/2016 Medical Record Number: [id number] Patient Account Number: [id number] Date of Birth/Sex: Sep 13, 1956 (61 y.o. Female) Treating RN: Huel Coventry Primary Care Physician: PATIENT, NO Other Clinician: Referring Physician: Earl Lagos Treating Physician/Extender: Rudene Re in Treatment: 4 Diagnosis Coding ICD-10 Codes Code Description [phone] Non-pressure chronic ulcer of right calf with fat layer exposed I83.012 Varicose veins of right lower extremity with ulcer of calf F17.218 Nicotine dependence, cigarettes, with other nicotine-induced disorders M05.60 Rheumatoid arthritis of unspecified site with involvement of other organs and systems Facility Procedures CPT4: Description Modifier Quantity Code [id number] (Facility Use Only) [id number] - APPLY MULTLAY COMPRS LWR RT 1 LEG Physician Procedures CPT4: Description Modifier Quantity Code [id number] - WC PHYS LEVEL 3 - EST PT 1 ICD-10 Description Diagnosis L97.212 Non-pressure chronic ulcer of right  calf with fat layer exposed I83.012 Varicose veins of right lower extremity with ulcer of calf  F17.218 Nicotine dependence, cigarettes, with other nicotine-induced disorders M05.60 Rheumatoid arthritis of unspecified site with involvement of other organs and systems Electronic Signature(s) Signed: 12/19/2016 4:15:25 PM By: Elliot Gurney, RN, BSN, Kim RN, BSN Previous Signature: 12/19/2016 8:56:24 AM Version By: Evlyn Kanner MD, FACS Entered By: Elliot Gurney, RN, BSN, Kim on 12/19/2016 16:15:06

## 2016-12-20 NOTE — Progress Notes (Signed)
CAMBREE, HENDRIX ([id number]) Visit Report for 12/19/2016 Arrival Information Details Patient Name: [name], [name]. Date of Service: 12/19/2016 8:15 AM Medical Record Number: [id number] Patient Account Number: [id number] Date of Birth/Sex: [birth year] (61 y.o. Female) Treating RN: Cornell Barman Primary Care Physician: PATIENT, NO Other Clinician: Referring Physician: Aldine Contes Treating Physician/Extender: Frann Rider in Treatment: 4 Visit Information History Since Last Visit Added or deleted any medications: No Patient Arrived: Ambulatory Any new allergies or adverse reactions: No Arrival Time: 08:32 Had a fall or experienced change in No Accompanied By: self activities of daily living that may affect Transfer Assistance: Manual risk of falls: Patient Identification Verified: Yes Signs or symptoms of abuse/neglect since last No Secondary Verification Process Yes visito Completed: Has Dressing in Place as Prescribed: Yes Patient Requires Transmission- No Has Compression in Place as Prescribed: No Based Precautions: Pain Present Now: Yes Patient Has Alerts: Yes Patient Alerts: L Leg ABI 1.2 R Leg ABI 0.88 ABIs done in [place] Signature(s) Signed: 12/19/2016 4:15:25 PM By: Gretta Cool, RN, BSN, Kim RN, BSN Entered By: Gretta Cool, RN, BSN, Kim on 12/19/2016 08:33:41 [name], [name] ([id number]) -------------------------------------------------------------------------------- Encounter Discharge Information Details Patient Name: [name]. Date of Service: 12/19/2016 8:15 AM Medical Record Number: [id number] Patient Account Number: [id number] Date of Birth/Sex: 01/25/1956 (61 y.o. Female) Treating RN: Cornell Barman Primary Care Physician: PATIENT, NO Other Clinician: Referring Physician: Aldine Contes Treating Physician/Extender: Frann Rider in Treatment: 4 Encounter Discharge Information Items Discharge Pain Level: 2 Discharge Condition:  Stable Ambulatory Status: Ambulatory Discharge Destination: Home Transportation: Private Auto Accompanied By: self Schedule Follow-up Appointment: Yes Medication Reconciliation completed and provided to Patient/Care Yes Rusell Meneely: Provided on Clinical Summary of Care: 12/19/2016 Form Type Recipient Paper Patient JP Electronic Signature(s) Signed: 12/19/2016 9:13:48 AM By: Ruthine Dose Entered By: Ruthine Dose on 12/19/2016 09:13:47 [name], [name] ([id number]) -------------------------------------------------------------------------------- Lower Extremity Assessment Details Patient Name: [name]. Date of Service: 12/19/2016 8:15 AM Medical Record Number: [id number] Patient Account Number: [id number] Date of Birth/Sex: 01-28-1956 (61 y.o. Female) Treating RN: Cornell Barman Primary Care Physician: PATIENT, NO Other Clinician: Referring Physician: Aldine Contes Treating Physician/Extender: Frann Rider in Treatment: 4 Edema Assessment Assessed: [Left: No] [Right: No] E[Left: dema] [Right: :] Calf Left: Right: Point of Measurement: 32 cm From Medial Instep cm 50 cm Ankle Left: Right: Point of Measurement: 9 cm From Medial Instep cm 27 cm Vascular Assessment Pulses: Dorsalis Pedis Palpable: [Right:Yes] Posterior Tibial Extremity colors, hair growth, and conditions: Extremity Color: [Right:Hyperpigmented] Hair Growth on Extremity: [Right:Yes] Temperature of Extremity: [Right:Cool] Capillary Refill: [Right:< 3 seconds] Dependent Rubor: [Right:No] Blanched when Elevated: [Right:No] Lipodermatosclerosis: [Right:No] Toe Nail Assessment Left: Right: Thick: Yes Discolored: Yes Deformed: Yes Improper Length and Hygiene: Yes Electronic Signature(s) Signed: 12/19/2016 4:15:25 PM By: Gretta Cool, RN, BSN, Kim RN, BSN [place], [place] ([id number]) Entered By: Gretta Cool, RN, BSN, Kim on 12/19/2016 08:46:41 [name], [name]  ([id number]) -------------------------------------------------------------------------------- Multi Wound Chart Details Patient Name: [name]. Date of Service: 12/19/2016 8:15 AM Medical Record Number: [id number] Patient Account Number: [id number] Date of Birth/Sex: [birth year] (61 y.o. Female) Treating RN: Cornell Barman Primary Care Physician: PATIENT, NO Other Clinician: Referring Physician: Aldine Contes Treating Physician/Extender: Frann Rider in Treatment: 4 Vital Signs Height(in): 63 Pulse(bpm): 75 Weight(lbs): 214.7 Blood Pressure 157/84 (mmHg): Body Mass Index(BMI): 38 Temperature(F): 97.8 Respiratory Rate 16 (breaths/min): Photos: [N/A:N/A] Wound Location: Right Lower Leg - Anterior N/A N/A Wounding Event: Gradually Appeared N/A N/A Primary Etiology: Venous Leg Ulcer N/A N/A  Comorbid History: Chronic Obstructive N/A N/A Pulmonary Disease (COPD), Hypertension, Rheumatoid Arthritis Date Acquired: 05/19/2016 N/A N/A Weeks of Treatment: 4 N/A N/A Wound Status: Open N/A N/A Measurements L x W x D 3x3.2x0.1 N/A N/A (cm) Area (cm) : 7.54 N/A N/A Volume (cm) : 0.754 N/A N/A % Reduction in Area: 57.90% N/A N/A % Reduction in Volume: 57.90% N/A N/A Classification: Full Thickness Without N/A N/A Exposed Support Structures Exudate Amount: Large N/A N/A Exudate Type: Serous N/A N/A Exudate Color: amber N/A N/A Wound Margin: Distinct, outline attached N/A N/A Granulation Amount: Medium (34-66%) N/A N/A Lavell, Mikyla K. ([id number]) Granulation Quality: Pink, Hyper-granulation N/A N/A Necrotic Amount: Medium (34-66%) N/A N/A Exposed Structures: Fat: Yes N/A N/A Fascia: No Tendon: No Muscle: No Joint: No Bone: No Epithelialization: None N/A N/A Periwound Skin Texture: Edema: Yes N/A N/A Periwound Skin Moist: Yes N/A N/A Moisture: Periwound Skin Color: Ecchymosis: Yes N/A N/A Erythema: Yes Erythema Location: Circumferential N/A  N/A Temperature: No Abnormality N/A N/A Tenderness on Yes N/A N/A Palpation: Wound Preparation: Ulcer Cleansing: N/A N/A Rinsed/Irrigated with Saline, Other: soap and water Topical Anesthetic Applied: Other: lidocaine 4% Treatment Notes Electronic Signature(s) Signed: 12/19/2016 8:54:59 AM By: Christin Fudge MD, FACS Entered By: Christin Fudge on 12/19/2016 08:54:59 [name], [name] ([id number]) -------------------------------------------------------------------------------- [place] Details Patient Name: AKASIA, AHMAD. Date of Service: 12/19/2016 8:15 AM Medical Record Number: [id number] Patient Account Number: [id number] Date of Birth/Sex: September 06, 1956 (61 y.o. Female) Treating RN: Cornell Barman Primary Care Physician: PATIENT, NO Other Clinician: Referring Physician: Aldine Contes Treating Physician/Extender: Frann Rider in Treatment: 4 Active Inactive Abuse / Safety / Falls / Self Care Management Nursing Diagnoses: Potential for falls Goals: Patient will remain injury free Date Initiated: 11/18/2016 Goal Status: Active Interventions: Assess fall risk on admission and as needed Assess self care needs on admission and as needed Notes: Nutrition Nursing Diagnoses: Imbalanced nutrition Goals: Patient/caregiver agrees to and verbalizes understanding of need to use nutritional supplements and/or vitamins as prescribed Date Initiated: 11/18/2016 Goal Status: Active Interventions: Assess patient nutrition upon admission and as needed per policy Notes: Orientation to the Wound Care Program Nursing Diagnoses: Knowledge deficit related to the wound healing center program Goals: [name], [name] ([id number]) Patient/caregiver will verbalize understanding of the [facility] Program Date Initiated: 11/18/2016 Goal Status: Active Interventions: Provide education on orientation to the wound center Notes: Pain, Acute or Chronic Nursing  Diagnoses: Pain, acute or chronic: actual or potential Potential alteration in comfort, pain Goals: Patient will verbalize adequate pain control and receive pain control interventions during procedures as needed Date Initiated: 11/18/2016 Goal Status: Active Patient/caregiver will verbalize adequate pain control between visits Date Initiated: 11/18/2016 Goal Status: Active Patient/caregiver will verbalize comfort level met Date Initiated: 11/18/2016 Goal Status: Active Interventions: Assess comfort goal upon admission Complete pain assessment as per visit requirements Notes: Soft Tissue Infection Nursing Diagnoses: Impaired tissue integrity Knowledge deficit related to disease process and management Knowledge deficit related to home infection control: handwashing, handling of soiled dressings, supply storage Goals: Patient/caregiver will verbalize understanding of or measures to prevent infection and contamination in the home setting Date Initiated: 11/18/2016 Goal Status: Active Patient's soft tissue infection will resolve [name], [name] ([id number]) Date Initiated: 11/18/2016 Goal Status: Active Interventions: Assess signs and symptoms of infection every visit Provide education on infection Treatment Activities: Education provided on Infection : 11/18/2016 Notes: Wound/Skin Impairment Nursing Diagnoses: Impaired tissue integrity Goals: Ulcer/skin breakdown will have a volume reduction of 30% by week  4 Date Initiated: 11/18/2016 Goal Status: Active Ulcer/skin breakdown will have a volume reduction of 50% by week 8 Date Initiated: 11/18/2016 Goal Status: Active Ulcer/skin breakdown will have a volume reduction of 80% by week 12 Date Initiated: 11/18/2016 Goal Status: Active Interventions: Assess patient/caregiver ability to perform ulcer/skin care regimen upon admission and as needed Assess ulceration(s) every visit Provide education on smoking Notes: Electronic  Signature(s) Signed: 12/19/2016 4:15:25 PM By: Gretta Cool, RN, BSN, Kim RN, BSN Entered By: Gretta Cool, RN, BSN, Kim on 12/19/2016 08:48:35 [name], [name] ([id number]) -------------------------------------------------------------------------------- Pain Assessment Details Patient Name: [name]. Date of Service: 12/19/2016 8:15 AM Medical Record Number: [id number] Patient Account Number: [id number] Date of Birth/Sex: Jun 16, 1956 (61 y.o. Female) Treating RN: Cornell Barman Primary Care Physician: PATIENT, NO Other Clinician: Referring Physician: Aldine Contes Treating Physician/Extender: Frann Rider in Treatment: 4 Active Problems Location of Pain Severity and Description of Pain Patient Has Paino Yes Site Locations Pain Location: Pain in Ulcers With Dressing Change: Yes Rate the pain. Current Pain Level: 7 Worst Pain Level: 10 Character of Pain Describe the Pain: Burning Pain Management and Medication Current Pain Management: Goals for Pain Management Topical or injectable lidocaine is offered to patient for acute pain when surgical debridement is performed. If needed, Patient is instructed to use over the counter pain medication for the following 24-48 hours after debridement. Wound care MDs do not prescribed pain medications. Patient has chronic pain or uncontrolled pain. Patient has been instructed to make an appointment with their Primary Care Physician for pain management. Electronic Signature(s) Signed: 12/19/2016 4:15:25 PM By: Gretta Cool, RN, BSN, Kim RN, BSN Entered By: Gretta Cool, RN, BSN, Kim on 12/19/2016 08:34:04 [name], [name] ([id number]) -------------------------------------------------------------------------------- Patient/Caregiver Education Details Patient Name: [name], [name]. Date of Service: 12/19/2016 8:15 AM Medical Record Number: [id number] Patient Account Number: [id number] Date of Birth/Gender: [birth year] (60 y.o. Female) Treating RN: Cornell Barman Primary Care Physician: PATIENT, NO Other Clinician: Referring Physician: Aldine Contes Treating Physician/Extender: Frann Rider in Treatment: 4 Education Assessment Education Provided To: Patient Education Topics Provided Smoking and Wound Healing: Handouts: Smoking and Wound Healing Methods: Explain/Verbal Responses: State content correctly Venous: Controlling Swelling with Multilayered Compression Wraps, Other: called for re-wrap if wrap Handouts: slides below 1 inch. Methods: Explain/Verbal Responses: State content correctly Wound/Skin Impairment: Handouts: Caring for Your Ulcer Methods: Demonstration Responses: State content correctly Electronic Signature(s) Signed: 12/19/2016 4:15:25 PM By: Gretta Cool, RN, BSN, Kim RN, BSN Entered By: Gretta Cool, RN, BSN, Kim on 12/19/2016 09:10:50 [name], [name] ([id number]) -------------------------------------------------------------------------------- Wound Assessment Details Patient Name: MACKENSIE, PILSON. Date of Service: 12/19/2016 8:15 AM Medical Record Number: [id number] Patient Account Number: [id number] Date of Birth/Sex: [birth year] (61 y.o. Female) Treating RN: Cornell Barman Primary Care Physician: PATIENT, NO Other Clinician: Referring Physician: Aldine Contes Treating Physician/Extender: Frann Rider in Treatment: 4 Wound Status Wound Number: 1 Primary Venous Leg Ulcer Etiology: Wound Location: Right Lower Leg - Anterior Wound Open Wounding Event: Gradually Appeared Status: Date Acquired: 05/19/2016 Comorbid Chronic Obstructive Pulmonary Weeks Of Treatment: 4 History: Disease (COPD), Hypertension, Clustered Wound: No Rheumatoid Arthritis Photos Wound Measurements Length: (cm) 3 Width: (cm) 3.2 Depth: (cm) 0.1 Area: (cm) 7.54 Volume: (cm) 0.754 % Reduction in Area: 57.9% % Reduction in Volume: 57.9% Epithelialization: None Tunneling: No Undermining: No Wound Description Full Thickness  Without Exposed Classification: Support Structures Wound Margin: Distinct, outline attached Exudate Large Amount: Exudate Type: Serous Exudate Color: amber Foul Odor After Cleansing: No Wound Bed Granulation Amount: Medium (34-66%) Exposed  Structure Granulation Quality: Pink, Hyper-granulation Fascia Exposed: No Necrotic Amount: Medium (34-66%) Fat Layer Exposed: Yes Necrotic Quality: Adherent Slough Tendon Exposed: No Muscle Exposed: No Riera, Alexsis K. ([id number]) Joint Exposed: No Bone Exposed: No Periwound Skin Texture Texture Color No Abnormalities Noted: No No Abnormalities Noted: No Localized Edema: Yes Ecchymosis: Yes Erythema: Yes Moisture Erythema Location: Circumferential No Abnormalities Noted: No Moist: Yes Temperature / Pain Temperature: No Abnormality Tenderness on Palpation: Yes Wound Preparation Ulcer Cleansing: Rinsed/Irrigated with Saline, Other: soap and water, Topical Anesthetic Applied: Other: lidocaine 4%, Treatment Notes Wound #1 (Right, Anterior Lower Leg) 1. Cleansed with: Cleanse wound with antibacterial soap and water 2. Anesthetic Topical Lidocaine 4% cream to wound bed prior to debridement 4. Dressing Applied: Santyl Ointment 7. Secured with 3 Layer Compression System - Right Lower Extremity Notes pink dome paste; xtrasorb, ABD Electronic Signature(s) Signed: 12/19/2016 4:15:25 PM By: Gretta Cool, RN, BSN, Kim RN, BSN Entered By: Gretta Cool, RN, BSN, Kim on 12/19/2016 08:44:44 [place], [name] ([id number]) -------------------------------------------------------------------------------- [facility] Details Patient Name: [name]. Date of Service: 12/19/2016 8:15 AM Medical Record Number: [id number] Patient Account Number: [id number] Date of Birth/Sex: 1955-12-26 (61 y.o. Female) Treating RN: Cornell Barman Primary Care Physician: PATIENT, NO Other Clinician: Referring Physician: Aldine Contes Treating Physician/Extender: Frann Rider in Treatment: 4 Vital Signs Time Taken: 08:35 Temperature (F): 97.8 Height (in): 63 Pulse (bpm): 75 Weight (lbs): 214.7 Respiratory Rate (breaths/min): 16 Body Mass Index (BMI): 38 Blood Pressure (mmHg): 157/84 Reference Range: 80 - 120 mg / dl Electronic Signature(s) Signed: 12/19/2016 4:15:25 PM By: Gretta Cool, RN, BSN, Kim RN, BSN Entered By: Gretta Cool, RN, BSN, Kim on 12/19/2016 08:37:35

## 2016-12-23 ENCOUNTER — Ambulatory Visit: Payer: BLUE CROSS/BLUE SHIELD

## 2016-12-26 ENCOUNTER — Encounter: Payer: BLUE CROSS/BLUE SHIELD | Admitting: Surgery

## 2016-12-26 ENCOUNTER — Ambulatory Visit (INDEPENDENT_AMBULATORY_CARE_PROVIDER_SITE_OTHER): Payer: BLUE CROSS/BLUE SHIELD | Admitting: Internal Medicine

## 2016-12-26 ENCOUNTER — Encounter: Payer: Self-pay | Admitting: Internal Medicine

## 2016-12-26 DIAGNOSIS — E063 Autoimmune thyroiditis: Secondary | ICD-10-CM

## 2016-12-26 DIAGNOSIS — E039 Hypothyroidism, unspecified: Secondary | ICD-10-CM

## 2016-12-26 DIAGNOSIS — M069 Rheumatoid arthritis, unspecified: Secondary | ICD-10-CM

## 2016-12-26 DIAGNOSIS — I83012 Varicose veins of right lower extremity with ulcer of calf: Secondary | ICD-10-CM | POA: Diagnosis not present

## 2016-12-26 DIAGNOSIS — K219 Gastro-esophageal reflux disease without esophagitis: Secondary | ICD-10-CM

## 2016-12-26 DIAGNOSIS — E038 Other specified hypothyroidism: Secondary | ICD-10-CM

## 2016-12-26 MED ORDER — DICLOFENAC SODIUM 1 % TD GEL
2.0000 g | Freq: Four times a day (QID) | TRANSDERMAL | 2 refills | Status: DC
Start: 2016-12-26 — End: 2017-06-17

## 2016-12-26 NOTE — Progress Notes (Signed)
   CC: GERD  HPI:  Ms.Katherinne K Alemany is a 61 y.o. female with a past medical history listed below here today for follow up of her GERD.  Ms. Devonshire was seen in clinic 3 weeks ago and noted to have symptoms of GERD. She was started on Protonix and told to follow up in clinic. She says that she did not have insurance at the time and has not filled the prescription. Has since obtained insurance with the new year. She was unaware the prescription was ready and reports she will fill it today. She does still endorse nausea and reflux worst when she lays down at night. Denies any dysphagia or odynophagia.   Has complaints of right knee pain. Reports she has been taking hydrocodone-acetaminophen 5 mg as needed at home. It appears that she was discharged on a short course of Percocet at discharge from the hospital in December due to pain in the ulcer of her RLE. The ulcer and pain associated with it have improved since undergoing wound care. She has followed up in clinic twice since that time and has been prescribed Norco 5 mg #20 pills at both visits. I am unable to find any documentation as to the reason for the prescription at either visits. Today, she requests a refill of her pain medication. After discussion, it appears she has been taking it for right knee pain from her RA. Review of the [facility] database shows that she has filled her prescriptions appropriately and appears to have previously been on Tramadol from her previous PCP. Discussed that I saw no indication to continue the opiate medications today as she has not tried any conservative therapies.   She was noted to have a TSH of 99 during her hospitalization in December. Started on Synthroid 150 mcg at that time. Denies any side effects today.    Past Medical History:  Diagnosis Date  . Hashimoto's thyroiditis   . HTN (hypertension)   . Hypercholesteremia   . Hypothyroid   . Rheumatoid arthritis(714.0)     Review of Systems:   Negative except  as noted in HPI  Physical Exam:  Vitals:   12/26/16 1326  BP: 128/71  Pulse: 84  Temp: 97.8 F (36.6 C)  TempSrc: Oral  SpO2: 96%  Weight: 235 lb (106.6 kg)  Height: 5\' 2"  (1.575 m)   General Apperance: NAD Lungs: Clear to auscultation bilaterally. No wheezes, rhonchi or rales. Breathing comfortably Heart: Regular rate and rhythm, no murmur/rub/gallop Abdomen: Soft, nontender, nondistended, no rebound/guarding Skin: No rashes or lesions  Assessment & Plan:   See Encounters Tab for problem based charting.  Patient discussed with Dr. 

## 2016-12-26 NOTE — Patient Instructions (Addendum)
[name],  I would like you to try something different for your knee pain. The pain medication you have been on was a short course mainly for your leg ulceration pain. This knee pain is different and we need to try other measures to try and help the pain. I am going to send in a prescription for a gel called Voltaren gel you can use on the knee 4 times a day.   Please pick up the Protonix and take it daily.   I would like to see you back in clinic in 2 weeks for follow up.

## 2016-12-27 ENCOUNTER — Telehealth: Payer: Self-pay | Admitting: *Deleted

## 2016-12-27 NOTE — Progress Notes (Signed)
IVANNA, KOCAK ([id number]) Visit Report for 12/26/2016 Chief Complaint Document Details Patient Name: [name], [name] 12/26/2016 10:45 Date of Service: AM Medical Record [id number] Number: Patient Account Number: [id number] 1956-06-01 (61 y.o. Afful, RN, BSN, Date of Birth/Sex: Treating RN: Female) Psychologist, clinical Primary Care Physician: PATIENT, NO Other Clinician: Referring Physician: Earl Lagos Treating Verlin Uher Physician/Extender: Weeks in Treatment: 5 Information Obtained from: Patient Chief Complaint Patient presents for treatment of an open ulcer due to venous insufficiency to her right lower extremity which she's had for over 6 months Electronic Signature(s) Signed: 12/26/2016 11:34:53 AM By: Evlyn Kanner MD, FACS Entered By: Evlyn Kanner on 12/26/2016 11:34:53 [name], [name] ([id number]) -------------------------------------------------------------------------------- HPI Details Patient Name: [name], [name] 12/26/2016 10:45 Date of Service: AM Medical Record [id number] Number: Patient Account Number: [id number] 1956-03-03 (61 y.o. Afful, RN, BSN, Date of Birth/Sex: Treating RN: Female) Psychologist, clinical Primary Care Physician: PATIENT, NO Other Clinician: Referring Physician: Earl Lagos Treating Vence Lalor Physician/Extender: Weeks in Treatment: 5 History of Present Illness Location: ulcerated area on the right lower extremity Quality: Patient reports experiencing a sharp pain to affected area(s). Severity: Patient states wound are getting worse. Duration: Patient has had the wound for > 6 months prior to seeking treatment at the wound center Timing: Pain in wound is constant (hurts all the time) Context: The wound appeared gradually over time Modifying Factors: Other treatment(s) tried include:recent admission to hospital for a cellulitis and was treated with antibiotics and an Unna boot Associated Signs and Symptoms: Patient reports having increase  swelling. HPI Description: 61 year old patient who was recently admitted to the hospital between 11/14/2016 and 11/15/2016 for right lower extremity ulcer with cellulitis. she was asked to stop clindamycin and doxycycline and her prednisone and take Levaquin 500 mg daily by mouth. she was treated for a right lower extremity possibly due to venous stasis versus vascular insufficiency versus infection. She received IV vancomycin during her hospital stay. He is known to have rheumatoid arthritis and stopped taking her treatment because of lack of insurance. during her hospital stay vascular studies were done -- ABIs were 0.88 on the right and 1.1 on the left, she had triphasic flow on both lower extremities and a plain film of the right tibia and fibula showed no signs of osteomyelitis. She is not a diabetic. She is a smoker however and continued to smoke. lab work noted that her sedimentation rate was 29, CRP was less than 0.8, hemoglobin A1c was 5.2 12/02/2016 -- due to a family emergency and social economic reasons she has been unable to keep her appointment with the vascular department for her workup and is expecting insurance coverage after January 1 12/19/2016 -- she continues to smoke. As have new insurance and hopefully will get her vascular test done as soon as possible. 12/26/2016 -- she has quit smoking for the last 3 days and I have commended her. She did receive her appointment from [facility] and it is later this month Electronic Signature(s) Signed: 12/26/2016 11:35:22 AM By: Evlyn Kanner MD, FACS Entered By: Evlyn Kanner on 12/26/2016 11:35:22 [name], [name] ([id number]) -------------------------------------------------------------------------------- Physical Exam Details Patient Name: [name], [name] 12/26/2016 10:45 Date of Service: AM Medical Record [id number] Number: Patient Account Number: [id number] April 03, 1956 (61 y.o. Afful, RN, BSN, Date of  Birth/Sex: Treating RN: Female) Psychologist, clinical Primary Care Physician: PATIENT, NO Other Clinician: Referring Physician: Earl Lagos Treating Avyay Coger Physician/Extender: Weeks in Treatment: 5 Constitutional . Pulse regular. Respirations normal and unlabored. Afebrile. . Eyes  Nonicteric. Reactive to light. Ears, Nose, Mouth, and Throat Lips, teeth, and gums WNL.Marland Kitchen Moist mucosa without lesions. Neck supple and nontender. No palpable supraclavicular or cervical adenopathy. Normal sized without goiter. Respiratory WNL. No retractions.. Breath sounds WNL, No rubs, rales, rhonchi, or wheeze.. Cardiovascular Heart rhythm and rate regular, no murmur or gallop.. Pedal Pulses WNL. No clubbing, cyanosis or edema. Chest Breasts symmetical and no nipple discharge.. Breast tissue WNL, no masses, lumps, or tenderness.. Lymphatic No adneopathy. No adenopathy. No adenopathy. Musculoskeletal Adexa without tenderness or enlargement.. Digits and nails w/o clubbing, cyanosis, infection, petechiae, ischemia, or inflammatory conditions.. Integumentary (Hair, Skin) No suspicious lesions. No crepitus or fluctuance. No peri-wound warmth or erythema. No masses.Marland Kitchen Psychiatric Judgement and insight Intact.. No evidence of depression, anxiety, or agitation.. Notes the lymphedema persists on the right lower extremity but her wound is looking much cleaner and there is no evidence of necrotic debris which need sharp debridement. Electronic Signature(s) Signed: 12/26/2016 11:35:46 AM By: Evlyn Kanner MD, FACS Entered By: Evlyn Kanner on 12/26/2016 11:35:46 [name], [name] ([id number]) -------------------------------------------------------------------------------- Physician Orders Details Patient Name: [name], [name] 12/26/2016 10:45 Date of Service: AM Medical Record [id number] Number: Patient Account Number: [id number] September 23, 1956 (61 y.o. Afful, RN, BSN, Date of Birth/Sex: Treating RN: Female)  Psychologist, clinical Primary Care Physician: PATIENT, NO Other Clinician: Referring Physician: Earl Lagos Treating Furkan Keenum Physician/Extender: Weeks in Treatment: 5 Verbal / Phone Orders: Yes Clinician: Afful, RN, BSN, Rita Read Back and Verified: Yes Diagnosis Coding Wound Cleansing Wound #1 Right,Anterior Lower Leg o Clean wound with Normal Saline. o Cleanse wound with mild soap and water Anesthetic Wound #1 Right,Anterior Lower Leg o Topical Lidocaine 4% cream applied to wound bed prior to debridement Primary Wound Dressing Wound #1 Right,Anterior Lower Leg o Hydrafera Blue Secondary Dressing Wound #1 Right,Anterior Lower Leg o ABD pad o XtraSorb Dressing Change Frequency Wound #1 Right,Anterior Lower Leg o Change dressing every week - Nurse visit as needed. Follow-up Appointments Wound #1 Right,Anterior Lower Leg o Return Appointment in 1 week. o Nurse Visit as needed Edema Control Wound #1 Right,Anterior Lower Leg o 3 Layer Compression System - Right Lower Extremity Additional Orders / Instructions Wound #1 Right,Anterior Lower Leg Taff, Shelie K. ([id number]) o Stop Smoking o Increase protein intake. Medications-please add to medication list. Wound #1 Right,Anterior Lower Leg o Other: - Vitamin C, Zinc, Vitamin A, MVI Electronic Signature(s) Signed: 12/26/2016 3:54:10 PM By: Evlyn Kanner MD, FACS Entered By: Evlyn Kanner on 12/26/2016 11:36:06 Capitano, [name] ([id number]) -------------------------------------------------------------------------------- Problem List Details Patient Name: [name], [name] 12/26/2016 10:45 Date of Service: AM Medical Record [id number] Number: Patient Account Number: [id number] 07/04/1956 (60 y.o. Afful, RN, BSN, Date of Birth/Sex: Treating RN: Female) Psychologist, clinical Primary Care Physician: PATIENT, NO Other Clinician: Referring Physician: Earl Lagos Treating Connar Keating,  [place] Physician/Extender: Weeks in Treatment: 5 Active Problems ICD-10 Encounter Code Description Active Date Diagnosis 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 layer 11/18/2016 Yes exposed I83.012 Varicose veins of right lower extremity with ulcer of calf 11/18/2016 Yes F17.218 Nicotine dependence, cigarettes, with other nicotine- 11/18/2016 Yes induced disorders M05.60 Rheumatoid arthritis of unspecified site with involvement 11/18/2016 Yes of other organs and systems Inactive Problems Resolved Problems Electronic Signature(s) Signed: 12/26/2016 11:34:42 AM By: Evlyn Kanner MD, FACS Entered By: Evlyn Kanner on 12/26/2016 11:34:42 Lindner, [name] ([id number]) -------------------------------------------------------------------------------- Progress Note Details Patient Name: [name], [name] 12/26/2016 10:45 Date of Service: AM Medical Record [id number] Number: Patient Account Number: [id number] 1955/12/19 (60 y.o. Afful,  RN, BSN, Date of Birth/Sex: Treating RN: Female) Psychologist, clinical Primary Care Physician: PATIENT, NO Other Clinician: Referring Physician: Earl Lagos Treating Ty Buntrock Physician/Extender: Weeks in Treatment: 5 Subjective Chief Complaint Information obtained from Patient Patient presents for treatment of an open ulcer due to venous insufficiency to her right lower extremity which she's had for over 6 months History of Present Illness (HPI) The following HPI elements were documented for the patient's wound: Location: ulcerated area on the right lower extremity Quality: Patient reports experiencing a sharp pain to affected area(s). Severity: Patient states wound are getting worse. Duration: Patient has had the wound for > 6 months prior to seeking treatment at the wound center Timing: Pain in wound is constant (hurts all the time) Context: The wound appeared gradually over time Modifying Factors: Other treatment(s) tried include:recent  admission to hospital for a cellulitis and was treated with antibiotics and an Unna boot Associated Signs and Symptoms: Patient reports having increase swelling. 61 year old patient who was recently admitted to the hospital between 11/14/2016 and 11/15/2016 for right lower extremity ulcer with cellulitis. she was asked to stop clindamycin and doxycycline and her prednisone and take Levaquin 500 mg daily by mouth. she was treated for a right lower extremity possibly due to venous stasis versus vascular insufficiency versus infection. She received IV vancomycin during her hospital stay. He is known to have rheumatoid arthritis and stopped taking her treatment because of lack of insurance. during her hospital stay vascular studies were done -- ABIs were 0.88 on the right and 1.1 on the left, she had triphasic flow on both lower extremities and a plain film of the right tibia and fibula showed no signs of osteomyelitis. She is not a diabetic. She is a smoker however and continued to smoke. lab work noted that her sedimentation rate was 29, CRP was less than 0.8, hemoglobin A1c was 5.2 12/02/2016 -- due to a family emergency and social economic reasons she has been unable to keep her appointment with the vascular department for her workup and is expecting insurance coverage after January 1 12/19/2016 -- she continues to smoke. As have new insurance and hopefully will get her vascular test done as soon as possible. 12/26/2016 -- she has quit smoking for the last 3 days and I have commended her. She did receive her appointment from [facility] and it is later this month [name], [name]. ([id number]) Objective Constitutional Pulse regular. Respirations normal and unlabored. Afebrile. Vitals Time Taken: 10:55 AM, Height: 63 in, Weight: 214.7 lbs, BMI: 38, Temperature: 98 F, Pulse: 68 bpm, Respiratory Rate: 16 breaths/min, Blood Pressure: 138/69 mmHg. Eyes Nonicteric. Reactive to  light. Ears, Nose, Mouth, and Throat Lips, teeth, and gums WNL.Marland Kitchen Moist mucosa without lesions. Neck supple and nontender. No palpable supraclavicular or cervical adenopathy. Normal sized without goiter. Respiratory WNL. No retractions.. Breath sounds WNL, No rubs, rales, rhonchi, or wheeze.. Cardiovascular Heart rhythm and rate regular, no murmur or gallop.. Pedal Pulses WNL. No clubbing, cyanosis or edema. Chest Breasts symmetical and no nipple discharge.. Breast tissue WNL, no masses, lumps, or tenderness.. Lymphatic No adneopathy. No adenopathy. No adenopathy. Musculoskeletal Adexa without tenderness or enlargement.. Digits and nails w/o clubbing, cyanosis, infection, petechiae, ischemia, or inflammatory conditions.Marland Kitchen Psychiatric Judgement and insight Intact.. No evidence of depression, anxiety, or agitation.. General Notes: the lymphedema persists on the right lower extremity but her wound is looking much cleaner and there is no evidence of necrotic debris which need sharp debridement. Integumentary (Hair, Skin) No  suspicious lesions. No crepitus or fluctuance. No peri-wound warmth or erythema. No masses.Marland Kitchen [name], [name] ([id number]) Wound #1 status is Open. Original cause of wound was Gradually Appeared. The wound is located on the Right,Anterior Lower Leg. The wound measures 3.5cm length x 4cm width x 0.1cm depth; 10.996cm^2 area and 1.1cm^3 volume. There is fat exposed. There is no tunneling or undermining noted. There is a large amount of serous drainage noted. The wound margin is distinct with the outline attached to the wound base. There is medium (34-66%) pink granulation within the wound bed. There is a medium (34-66%) amount of necrotic tissue within the wound bed including Adherent Slough. The periwound skin appearance exhibited: Localized Edema, Moist, Ecchymosis, Erythema. The surrounding wound skin color is noted with erythema which is circumferential. Periwound  temperature was noted as No Abnormality. The periwound has tenderness on palpation. Assessment Active Problems ICD-10 L97.212 - Non-pressure chronic ulcer of right calf with fat layer exposed I83.012 - Varicose veins of right lower extremity with ulcer of calf F17.218 - Nicotine dependence, cigarettes, with other nicotine-induced disorders M05.60 - Rheumatoid arthritis of unspecified site with involvement of other organs and systems Plan Wound Cleansing: Wound #1 Right,Anterior Lower Leg: Clean wound with Normal Saline. Cleanse wound with mild soap and water Anesthetic: Wound #1 Right,Anterior Lower Leg: Topical Lidocaine 4% cream applied to wound bed prior to debridement Primary Wound Dressing: Wound #1 Right,Anterior Lower Leg: Hydrafera Blue Secondary Dressing: Wound #1 Right,Anterior Lower Leg: ABD pad XtraSorb Dressing Change Frequency: Wound #1 Right,Anterior Lower Leg: Change dressing every week - Nurse visit as needed. Follow-up Appointments: Wound #1 Right,Anterior Lower Leg: Return Appointment in 1 week. [name], [name] ([id number]) Nurse Visit as needed Edema Control: Wound #1 Right,Anterior Lower Leg: 3 Layer Compression System - Right Lower Extremity Additional Orders / Instructions: Wound #1 Right,Anterior Lower Leg: Stop Smoking Increase protein intake. Medications-please add to medication list.: Wound #1 Right,Anterior Lower Leg: Other: - Vitamin C, Zinc, Vitamin A, MVI I have recommended: 1. Hydroferra blue and a 3 layer Profore compression to be changed every week 2. Elevation and exercise 3. venous duplex studies of both lower extremity to be done soon as her insurance coverage is achieved -- appointment still pending at the end of the month 4. I have urged her to completely give up smoking.She is smoke free 3 days and I have commended her. 5. regular visits to the wound center Electronic Signature(s) Signed: 12/26/2016 11:37:22 AM By: Evlyn Kanner MD, FACS Entered By: Evlyn Kanner on 12/26/2016 11:37:22 Skolnik, [name] ([id number]) -------------------------------------------------------------------------------- SuperBill Details Patient Name: Marilyn Mins. Date of Service: 12/26/2016 Medical Record Patient Account Number: [id number] Number: Afful, RN, BSN, Treating RN: March 09, 1956 [phone] y.o. [place] Date of Birth/Sex: Female) Other Clinician: Primary Care Physician: PATIENT, NO Treating Makisha Marrin Referring Physician: Earl Lagos Physician/Extender: Weeks in Treatment: 5 Diagnosis Coding ICD-10 Codes Code Description [phone] Non-pressure chronic ulcer of right calf with fat layer exposed I83.012 Varicose veins of right lower extremity with ulcer of calf F17.218 Nicotine dependence, cigarettes, with other nicotine-induced disorders M05.60 Rheumatoid arthritis of unspecified site with involvement of other organs and systems Facility Procedures CPT4: Description Modifier Quantity Code [id number] (Facility Use Only) 56433IR - APPLY MULTLAY COMPRS LWR RT 1 LEG Physician Procedures CPT4: Description Modifier Quantity Code [id number] 99213 - WC PHYS LEVEL 3 - EST PT 1 ICD-10 Description Diagnosis 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 layer exposed I83.012 Varicose veins of right lower extremity  with ulcer of calf  F17.218 Nicotine dependence, cigarettes, with other nicotine-induced disorders M05.60 Rheumatoid arthritis of unspecified site with involvement of other organs and systems Electronic Signature(s) Signed: 12/26/2016 12:39:38 PM By: Elpidio Eric BSN, RN Signed: 12/26/2016 3:54:10 PM By: Evlyn Kanner MD, FACS Previous Signature: 12/26/2016 11:37:33 AM Version By: Evlyn Kanner MD, FACS Entered By: Elpidio Eric on 12/26/2016 12:39:38

## 2016-12-27 NOTE — Progress Notes (Signed)
Internal Medicine Clinic Attending  Case discussed with Dr. Boswell at the time of the visit.  We reviewed the resident's history and exam and pertinent patient test results.  I agree with the assessment, diagnosis, and plan of care documented in the resident's note.  

## 2016-12-27 NOTE — Assessment & Plan Note (Signed)
  She was noted to have a TSH of 99 during her hospitalization in December. Started on Synthroid 150 mcg at that time. Denies any side effects today.   A/P Re-check TSH at next visit in 2 weeks

## 2016-12-27 NOTE — Assessment & Plan Note (Signed)
[name] was seen in clinic 3 weeks ago and noted to have symptoms of GERD. She was started on Protonix and told to follow up in clinic. She says that she did not have insurance at the time and has not filled the prescription. Has since obtained insurance with the new year. She was unaware the prescription was ready and reports she will fill it today. She does still endorse nausea and reflux worst when she lays down at night. Denies any dysphagia or odynophagia.   A/P Asked her to fill the Rx for Protonix today.  Reassess symptoms in 2 weeks.

## 2016-12-27 NOTE — Assessment & Plan Note (Addendum)
Has complaints of right knee pain. Reports she has been taking hydrocodone-acetaminophen 5 mg as needed at home. It appears that she was discharged on a short course of Percocet at discharge from the hospital in December due to pain in the ulcer of her RLE. The ulcer and pain associated with it have improved since undergoing wound care. She has followed up in clinic twice since that time and has been prescribed Norco 5 mg #20 pills at both visits. I am unable to find any documentation as to the reason for the prescription at either visits. Today, she requests a refill of her pain medication. After discussion, it appears she has been taking it for right knee pain from her RA. Review of the [place] database shows that she has filled her prescriptions appropriately and appears to have previously been on Tramadol from her previous PCP. Discussed that I saw no indication to continue the opiate medications today as she has not tried any conservative therapies.   A/P Will try Voltaren gel prn for her knee pain Referral to Rheumatology pending CBC and CMP at last visit wnl, continue methotrexate

## 2016-12-27 NOTE — Progress Notes (Signed)
[name], Marilyn ([id number]) Visit Report for 12/26/2016 Arrival Information Details Patient Name: [name], [name]. Date of Service: 12/26/2016 10:45 AM Medical Record Number: [id number] Patient Account Number: [id number] Date of Birth/Sex: Dec 29, 1955 (61 y.o. Female) Treating RN: Afful, RN, BSN, [employer] Primary Care Physician: PATIENT, NO Other Clinician: Referring Physician: Aldine Contes Treating Physician/Extender: Frann Rider in Treatment: 5 Visit Information History Since Last Visit All ordered tests and consults were completed: No Patient Arrived: Ambulatory Added or deleted any medications: No Arrival Time: 10:53 Any new allergies or adverse reactions: No Accompanied By: self Had a fall or experienced change in No Transfer Assistance: None activities of daily living that may affect Patient Identification Verified: Yes risk of falls: Secondary Verification Process Yes Signs or symptoms of abuse/neglect since last No Completed: visito Patient Requires Transmission- No Hospitalized since last visit: No Based Precautions: Has Dressing in Place as Prescribed: Yes Patient Has Alerts: Yes Has Compression in Place as Prescribed: Yes Patient Alerts: L Leg ABI 1.2 Pain Present Now: Yes R Leg ABI 0.88 ABIs done in [place] Signature(s) Signed: 12/26/2016 5:45:48 PM By: Regan Lemming BSN, RN Entered By: Regan Lemming on 12/26/2016 10:54:18 Podolak, [name] ([id number]) -------------------------------------------------------------------------------- Encounter Discharge Information Details Patient Name: [name]. Date of Service: 12/26/2016 10:45 AM Medical Record Number: [id number] Patient Account Number: [id number] Date of Birth/Sex: 08-21-1956 (61 y.o. Female) Treating RN: Afful, RN, BSN, [employer] Harman Primary Care Physician: PATIENT, NO Other Clinician: Referring Physician: Aldine Contes Treating Physician/Extender: Frann Rider in Treatment:  5 Encounter Discharge Information Items Discharge Pain Level: 0 Discharge Condition: Stable Ambulatory Status: Ambulatory Discharge Destination: Home Transportation: Private Auto Accompanied By: self Schedule Follow-up Appointment: No Medication Reconciliation completed and provided to Patient/Care No Yoanna Jurczyk: Provided on Clinical Summary of Care: 12/26/2016 Form Type Recipient Paper Patient JP Electronic Signature(s) Signed: 12/26/2016 5:45:48 PM By: Regan Lemming BSN, RN Previous Signature: 12/26/2016 11:23:05 AM Version By: Ruthine Dose Entered By: Regan Lemming on 12/26/2016 11:23:28 Caserta, [name] ([id number]) -------------------------------------------------------------------------------- General Visit Notes Details Patient Name: [name]. Date of Service: 12/26/2016 10:45 AM Medical Record Number: [id number] Patient Account Number: [id number] Date of Birth/Sex: 1956-01-31 (61 y.o. Female) Treating RN: Baruch Gouty, RN, BSN, [employer] Harman Primary Care Physician: PATIENT, NO Other Clinician: Referring Physician: Aldine Contes Treating Physician/Extender: Frann Rider in Treatment: 5 Notes Patient said she has an app=t with [facility] end of this month. She didn't remember the exact date Electronic Signature(s) Signed: 12/26/2016 5:45:48 PM By: Regan Lemming BSN, RN Entered By: Regan Lemming on 12/26/2016 11:13:40 Berkey, [name] ([id number]) -------------------------------------------------------------------------------- Lower Extremity Assessment Details Patient Name: [name]. Date of Service: 12/26/2016 10:45 AM Medical Record Number: [id number] Patient Account Number: [id number] Date of Birth/Sex: 1956-02-14 (61 y.o. Female) Treating RN: Afful, RN, BSN, [employer] Primary Care Physician: PATIENT, NO Other Clinician: Referring Physician: Aldine Contes Treating Physician/Extender: Frann Rider in Treatment: 5 Edema Assessment Assessed: [Left: No] [Right:  No] E[Left: dema] [Right: :] Calf Left: Right: Point of Measurement: 32 cm From Medial Instep cm 49.5 cm Ankle Left: Right: Point of Measurement: 9 cm From Medial Instep cm 26.4 cm Vascular Assessment Claudication: Claudication Assessment [Right:None] Pulses: Dorsalis Pedis Palpable: [Right:Yes] Posterior Tibial Extremity colors, hair growth, and conditions: Extremity Color: [Right:Hyperpigmented] Hair Growth on Extremity: [Right:No] Temperature of Extremity: [Right:Warm] Capillary Refill: [Right:< 3 seconds] Electronic Signature(s) Signed: 12/26/2016 5:45:48 PM By: Regan Lemming BSN, RN Entered By: Regan Lemming on 12/26/2016 10:55:16 Pousson, [name] ([id number]) -------------------------------------------------------------------------------- Multi Wound Chart Details  Patient Name: Marilyn, [name]. Date of Service: 12/26/2016 10:45 AM Medical Record Number: [id number] Patient Account Number: [id number] Date of Birth/Sex: [birth year] (61 y.o. Female) Treating RN: Baruch Gouty, RN, BSN, Velva Harman Primary Care Physician: PATIENT, NO Other Clinician: Referring Physician: Aldine Contes Treating Physician/Extender: Frann Rider in Treatment: 5 Vital Signs Height(in): 63 Pulse(bpm): 68 Weight(lbs): 214.7 Blood Pressure 138/69 (mmHg): Body Mass Index(BMI): 38 Temperature(F): 98 Respiratory Rate 16 (breaths/min): Photos: [1:No Photos] [N/A:N/A] Wound Location: [1:Right Lower Leg - Anterior N/A] Wounding Event: [1:Gradually Appeared] [N/A:N/A] Primary Etiology: [1:Venous Leg Ulcer] [N/A:N/A] Comorbid History: [1:Chronic Obstructive Pulmonary Disease (COPD), Hypertension, Rheumatoid Arthritis] [N/A:N/A] Date Acquired: [1:05/19/2016] [N/A:N/A] Weeks of Treatment: [1:5] [N/A:N/A] Wound Status: [1:Open] [N/A:N/A] Measurements L x W x D 3.5x4x0.1 [N/A:N/A] (cm) Area (cm) : [1:10.996] [N/A:N/A] Volume (cm) : [1:1.1] [N/A:N/A] % Reduction in Area: [1:38.60%] [N/A:N/A] %  Reduction in Volume: 38.50% [N/A:N/A] Classification: [1:Full Thickness Without Exposed Support Structures] [N/A:N/A] Exudate Amount: [1:Large] [N/A:N/A] Exudate Type: [1:Serous] [N/A:N/A] Exudate Color: [1:amber] [N/A:N/A] Wound Margin: [1:Distinct, outline attached N/A] Granulation Amount: [1:Medium (34-66%)] [N/A:N/A] Granulation Quality: [1:Pink, Hyper-granulation N/A] Necrotic Amount: [1:Medium (34-66%)] [N/A:N/A] Exposed Structures: [1:Fat: Yes Fascia: No Tendon: No] [N/A:N/A] Muscle: No Joint: No Bone: No Epithelialization: None N/A N/A Periwound Skin Texture: Edema: Yes N/A N/A Periwound Skin Moist: Yes N/A N/A Moisture: Periwound Skin Color: Ecchymosis: Yes N/A N/A Erythema: Yes Erythema Location: Circumferential N/A N/A Temperature: No Abnormality N/A N/A Tenderness on Yes N/A N/A Palpation: Wound Preparation: Ulcer Cleansing: N/A N/A Rinsed/Irrigated with Saline, Other: soap and water Topical Anesthetic Applied: Other: lidocaine 4% Treatment Notes Wound #1 (Right, Anterior Lower Leg) 1. Cleansed with: Cleanse wound with antibacterial soap and water 3. Peri-wound Care: Barrier cream Moisturizing lotion 4. Dressing Applied: Hydrafera Blue 5. Secondary Dressing Applied Dry Gauze 7. Secured with 3 Layer Compression System - Right Lower Extremity Notes pink dome paste; xtrasorb, ABD Electronic Signature(s) Signed: 12/26/2016 11:34:47 AM By: Christin Fudge MD, FACS Entered By: Christin Fudge on 12/26/2016 11:34:47 Wirsing, [name] ([id number]) -------------------------------------------------------------------------------- [place] Details Patient Name: YANIA, BOGIE. Date of Service: 12/26/2016 10:45 AM Medical Record Number: [id number] Patient Account Number: [id number] Date of Birth/Sex: 11/01/1956 (61 y.o. Female) Treating RN: Afful, RN, BSN, [employer] Primary Care Physician: PATIENT, NO Other Clinician: Referring Physician: Aldine Contes Treating Physician/Extender: Frann Rider in Treatment: 5 Active Inactive Abuse / Safety / Falls / Self Care Management Nursing Diagnoses: Potential for falls Goals: Patient will remain injury free Date Initiated: 11/18/2016 Goal Status: Active Interventions: Assess fall risk on admission and as needed Assess self care needs on admission and as needed Notes: Nutrition Nursing Diagnoses: Imbalanced nutrition Goals: Patient/caregiver agrees to and verbalizes understanding of need to use nutritional supplements and/or vitamins as prescribed Date Initiated: 11/18/2016 Goal Status: Active Interventions: Assess patient nutrition upon admission and as needed per policy Notes: Orientation to the Wound Care Program Nursing Diagnoses: Knowledge deficit related to the wound healing center program Goals: ANTANISHA, MOHS ([id number]) Patient/caregiver will verbalize understanding of the [place] Date Initiated: 11/18/2016 Goal Status: Active Interventions: Provide education on orientation to the wound center Notes: Pain, Acute or Chronic Nursing Diagnoses: Pain, acute or chronic: actual or potential Potential alteration in comfort, pain Goals: Patient will verbalize adequate pain control and receive pain control interventions during procedures as needed Date Initiated: 11/18/2016 Goal Status: Active Patient/caregiver will verbalize adequate pain control between visits Date Initiated: 11/18/2016 Goal Status: Active Patient/caregiver will verbalize comfort level met Date Initiated: 11/18/2016  Goal Status: Active Interventions: Assess comfort goal upon admission Complete pain assessment as per visit requirements Notes: Soft Tissue Infection Nursing Diagnoses: Impaired tissue integrity Knowledge deficit related to disease process and management Knowledge deficit related to home infection control: handwashing, handling of soiled dressings,  supply storage Goals: Patient/caregiver will verbalize understanding of or measures to prevent infection and contamination in the home setting Date Initiated: 11/18/2016 Goal Status: Active Patient's soft tissue infection will resolve REGNIA, MATHWIG ([id number]) Date Initiated: 11/18/2016 Goal Status: Active Interventions: Assess signs and symptoms of infection every visit Provide education on infection Treatment Activities: Education provided on Infection : 11/18/2016 Notes: Wound/Skin Impairment Nursing Diagnoses: Impaired tissue integrity Goals: Ulcer/skin breakdown will have a volume reduction of 30% by week 4 Date Initiated: 11/18/2016 Goal Status: Active Ulcer/skin breakdown will have a volume reduction of 50% by week 8 Date Initiated: 11/18/2016 Goal Status: Active Ulcer/skin breakdown will have a volume reduction of 80% by week 12 Date Initiated: 11/18/2016 Goal Status: Active Interventions: Assess patient/caregiver ability to perform ulcer/skin care regimen upon admission and as needed Assess ulceration(s) every visit Provide education on smoking Notes: Electronic Signature(s) Signed: 12/26/2016 5:45:48 PM By: Regan Lemming BSN, RN Entered By: Regan Lemming on 12/26/2016 11:10:43 Mancera, [name] ([id number]) -------------------------------------------------------------------------------- Pain Assessment Details Patient Name: [name]. Date of Service: 12/26/2016 10:45 AM Medical Record Number: [id number] Patient Account Number: [id number] Date of Birth/Sex: 1956/04/23 (61 y.o. Female) Treating RN: Afful, RN, BSN, [employer] Primary Care Physician: PATIENT, NO Other Clinician: Referring Physician: Aldine Contes Treating Physician/Extender: Frann Rider in Treatment: 5 Active Problems Location of Pain Severity and Description of Pain Patient Has Paino Yes Site Locations Pain Location: Pain in Ulcers Character of Pain Describe the Pain: Burning Pain  Management and Medication Current Pain Management: Medication: Yes Rest: Yes How does your pain impact your activities of daily livingo Sleep: Yes Bathing: Yes Appetite: Yes Relationship With Others: Yes Bladder Continence: Yes Emotions: Yes Bowel Continence: Yes Work: Yes Toileting: Yes Drive: Yes Dressing: Yes Hobbies: Yes Electronic Signature(s) Signed: 12/26/2016 5:45:48 PM By: Regan Lemming BSN, RN Entered By: Regan Lemming on 12/26/2016 10:54:31 Ripoll, [name] ([id number]) -------------------------------------------------------------------------------- Patient/Caregiver Education Details Patient Name: [name]. Date of Service: 12/26/2016 10:45 AM Medical Record Number: [id number] Patient Account Number: [id number] Date of Birth/Gender: 25-Mar-1956 (61 y.o. Female) Treating RN: Afful, RN, BSN, [employer] Primary Care Physician: PATIENT, NO Other Clinician: Referring Physician: Aldine Contes Treating Physician/Extender: Frann Rider in Treatment: 5 Education Assessment Education Provided To: Patient Education Topics Provided Infection: Methods: Explain/Verbal Responses: State content correctly Smoking and Wound Healing: Methods: Explain/Verbal Responses: State content correctly Welcome To [facility]: Methods: Explain/Verbal Responses: State content correctly Wound/Skin Impairment: Methods: Explain/Verbal Responses: State content correctly Electronic Signature(s) Signed: 12/26/2016 5:45:48 PM By: Regan Lemming BSN, RN Entered By: Regan Lemming on 12/26/2016 11:23:48 Pask, [name] ([id number]) -------------------------------------------------------------------------------- Wound Assessment Details Patient Name: [name]. Date of Service: 12/26/2016 10:45 AM Medical Record Number: [id number] Patient Account Number: [id number] Date of Birth/Sex: 1956-04-13 (61 y.o. Female) Treating RN: Afful, RN, BSN, [employer] Primary Care Physician:  PATIENT, NO Other Clinician: Referring Physician: Aldine Contes Treating Physician/Extender: Frann Rider in Treatment: 5 Wound Status Wound Number: 1 Primary Venous Leg Ulcer Etiology: Wound Location: Right Lower Leg - Anterior Wound Open Wounding Event: Gradually Appeared Status: Date Acquired: 05/19/2016 Comorbid Chronic Obstructive Pulmonary Weeks Of Treatment: 5 History: Disease (COPD), Hypertension, Clustered Wound: No Rheumatoid Arthritis Photos Photo Uploaded By: Regan Lemming  on 12/26/2016 12:37:28 Wound Measurements Length: (cm) 3.5 Width: (cm) 4 Depth: (cm) 0.1 Area: (cm) 10.996 Volume: (cm) 1.1 % Reduction in Area: 38.6% % Reduction in Volume: 38.5% Epithelialization: None Tunneling: No Undermining: No Wound Description Full Thickness Without Exposed Classification: Support Structures Wound Margin: Distinct, outline attached Exudate Large Amount: Exudate Type: Serous Exudate Color: amber Foul Odor After Cleansing: No Wound Bed Granulation Amount: Medium (34-66%) Exposed Structure Granulation Quality: Pink, Hyper-granulation Fascia Exposed: No Greaves, Caydance K. ([id number]) Necrotic Amount: Medium (34-66%) Fat Layer Exposed: Yes Necrotic Quality: Adherent Slough Tendon Exposed: No Muscle Exposed: No Joint Exposed: No Bone Exposed: No Periwound Skin Texture Texture Color No Abnormalities Noted: No No Abnormalities Noted: No Localized Edema: Yes Ecchymosis: Yes Erythema: Yes Moisture Erythema Location: Circumferential No Abnormalities Noted: No Moist: Yes Temperature / Pain Temperature: No Abnormality Tenderness on Palpation: Yes Wound Preparation Ulcer Cleansing: Rinsed/Irrigated with Saline, Other: soap and water, Topical Anesthetic Applied: Other: lidocaine 4%, Treatment Notes Wound #1 (Right, Anterior Lower Leg) 1. Cleansed with: Cleanse wound with antibacterial soap and water 3. Peri-wound Care: Barrier  cream Moisturizing lotion 4. Dressing Applied: Hydrafera Blue 5. Secondary Dressing Applied Dry Gauze 7. Secured with 3 Layer Compression System - Right Lower Extremity Notes pink dome paste; xtrasorb, ABD Electronic Signature(s) Signed: 12/26/2016 5:45:48 PM By: Regan Lemming BSN, RN Entered By: Regan Lemming on 12/26/2016 11:04:13 Schank, [name] ([id number]) -------------------------------------------------------------------------------- Vitals Details Patient Name: [name]. Date of Service: 12/26/2016 10:45 AM Medical Record Number: [id number] Patient Account Number: [id number] Date of Birth/Sex: Mar 21, 1956 (61 y.o. Female) Treating RN: Afful, RN, BSN, [employer] Primary Care Physician: PATIENT, NO Other Clinician: Referring Physician: Aldine Contes Treating Physician/Extender: Frann Rider in Treatment: 5 Vital Signs Time Taken: 10:55 Temperature (F): 98 Height (in): 63 Pulse (bpm): 68 Weight (lbs): 214.7 Respiratory Rate (breaths/min): 16 Body Mass Index (BMI): 38 Blood Pressure (mmHg): 138/69 Reference Range: 80 - 120 mg / dl Electronic Signature(s) Signed: 12/26/2016 5:45:48 PM By: Regan Lemming BSN, RN Entered By: Regan Lemming on 12/26/2016 10:56:02

## 2016-12-27 NOTE — Telephone Encounter (Signed)
Submitted PA request online via Cover My Meds-  Request was approved  12/26/2016 through 12/15/2038

## 2017-01-02 ENCOUNTER — Ambulatory Visit: Payer: BLUE CROSS/BLUE SHIELD | Admitting: Surgery

## 2017-01-06 ENCOUNTER — Encounter: Payer: Self-pay | Admitting: *Deleted

## 2017-01-09 ENCOUNTER — Ambulatory Visit: Payer: BLUE CROSS/BLUE SHIELD

## 2017-01-09 ENCOUNTER — Ambulatory Visit: Payer: BLUE CROSS/BLUE SHIELD | Admitting: Surgery

## 2017-01-13 ENCOUNTER — Encounter: Payer: BLUE CROSS/BLUE SHIELD | Admitting: Surgery

## 2017-01-13 DIAGNOSIS — I83012 Varicose veins of right lower extremity with ulcer of calf: Secondary | ICD-10-CM | POA: Diagnosis not present

## 2017-01-14 NOTE — Progress Notes (Signed)
CRICKET, GOODLIN ([id number]) Visit Report for 01/13/2017 Arrival Information Details Patient Name: LEANNY, MOECKEL. Date of Service: 01/13/2017 3:30 PM Medical Record Number: [id number] Patient Account Number: [id number] Date of Birth/Sex: 06/26/1956 (61 y.o. Female) Treating RN: Cornell Barman Primary Care Lorrene Graef: Einar Gip Other Clinician: Referring Avyon Herendeen: Einar Gip Treating Sabir Charters/Extender: Frann Rider in Treatment: 8 Visit Information History Since Last Visit Added or deleted any medications: No Patient Arrived: Ambulatory Any new allergies or adverse reactions: No Arrival Time: 15:27 Had a fall or experienced change in No Accompanied By: self activities of daily living that may affect Transfer Assistance: None risk of falls: Patient Identification Verified: Yes Signs or symptoms of abuse/neglect since last No Secondary Verification Process Yes visito Completed: Hospitalized since last visit: No Patient Requires Transmission- No Has Dressing in Place as Prescribed: Yes Based Precautions: Has Compression in Place as Prescribed: Yes Patient Has Alerts: Yes Pain Present Now: Yes Patient Alerts: L Leg ABI 1.2 R Leg ABI 0.88 ABIs done in [place] Signature(s) Signed: 01/13/2017 5:10:05 PM By: Gretta Cool, RN, BSN, Kim RN, BSN Entered By: Gretta Cool, RN, BSN, Kim on 01/13/2017 15:27:56 Krukowski, [name] ([id number]) -------------------------------------------------------------------------------- Compression Therapy Details Patient Name: [name]. Date of Service: 01/13/2017 3:30 PM Medical Record Number: [id number] Patient Account Number: [id number] Date of Birth/Sex: 05-10-1956 (61 y.o. Female) Treating RN: Cornell Barman Primary Care Jermarion Poffenberger: Einar Gip Other Clinician: Referring Aziah Kaiser: Einar Gip Treating Maral Lampe/Extender: Frann Rider in Treatment: 8 Compression Therapy Performed for Wound Wound #1 Right,Anterior Lower  Leg Assessment: Performed By: Clinician Cornell Barman, RN Compression Type: Three Layer Pre Treatment ABI: 0.9 Post Procedure Diagnosis Same as Pre-procedure Electronic Signature(s) Signed: 01/13/2017 5:10:05 PM By: Gretta Cool, RN, BSN, Kim RN, BSN Entered By: Gretta Cool, RN, BSN, Kim on 01/13/2017 16:08:37 Rago, [name] ([id number]) -------------------------------------------------------------------------------- Encounter Discharge Information Details Patient Name: INANNA, TELFORD. Date of Service: 01/13/2017 3:30 PM Medical Record Number: [id number] Patient Account Number: [id number] Date of Birth/Sex: [birth year] (61 y.o. Female) Treating RN: Cornell Barman Primary Care Fransisca Shawn: Einar Gip Other Clinician: Referring Jeneal Vogl: Einar Gip Treating Georgann Bramble/Extender: Frann Rider in Treatment: 8 Encounter Discharge Information Items Discharge Pain Level: 6 Discharge Condition: Stable Ambulatory Status: Ambulatory Discharge Destination: Home Transportation: Private Auto Accompanied By: self Schedule Follow-up Appointment: Yes Medication Reconciliation completed and provided to Patient/Care Yes Arianna Delsanto: Provided on Clinical Summary of Care: 01/13/2017 Form Type Recipient Paper Patient JP Electronic Signature(s) Signed: 01/13/2017 5:10:05 PM By: Gretta Cool RN, BSN, Kim RN, BSN Previous Signature: 01/13/2017 3:58:22 PM Version By: Ruthine Dose Entered By: Gretta Cool RN, BSN, Kim on 01/13/2017 16:06:06 Paulo, [name] ([id number]) -------------------------------------------------------------------------------- Lower Extremity Assessment Details Patient Name: DAMYRA, LUSCHER. Date of Service: 01/13/2017 3:30 PM Medical Record Number: [id number] Patient Account Number: [id number] Date of Birth/Sex: June 28, 1956 (61 y.o. Female) Treating RN: Cornell Barman Primary Care Ryker Sudbury: Einar Gip Other Clinician: Referring Bodin Gorka: Einar Gip Treating Caeleigh Prohaska/Extender: Frann Rider in Treatment: 8 Edema Assessment Assessed: [Left: No] [Right: No] E[Left: dema] [Right: :] Calf Left: Right: Point of Measurement: 32 cm From Medial Instep cm 43.5 cm Ankle Left: Right: Point of Measurement: 9 cm From Medial Instep cm 26.5 cm Vascular Assessment Claudication: Claudication Assessment [Right:None] Pulses: Dorsalis Pedis Palpable: [Right:Yes] Posterior Tibial Palpable: [Right:Yes] Extremity colors, hair growth, and conditions: Extremity Color: [Right:Hyperpigmented] Hair Growth on Extremity: [Right:Yes] Temperature of Extremity: [Right:Warm] Capillary Refill: [Right:< 3 seconds] Dependent Rubor: [Right:No] Blanched when Elevated: [Right:No] Lipodermatosclerosis: [Right:No] Toe Nail Assessment Left: Right: Thick: Yes Discolored: Yes Deformed:  Yes Improper Length and Hygiene: Yes LEVIE, OWENSBY ([id number]) Electronic Signature(s) Signed: 01/13/2017 5:10:05 PM By: Gretta Cool, RN, BSN, Kim RN, BSN Entered By: Gretta Cool, RN, BSN, Kim on 01/13/2017 15:37:23 Schorr, [name] ([id number]) -------------------------------------------------------------------------------- Multi Wound Chart Details Patient Name: [name]. Date of Service: 01/13/2017 3:30 PM Medical Record Number: [id number] Patient Account Number: [id number] Date of Birth/Sex: [birth year] (61 y.o. Female) Treating RN: Cornell Barman Primary Care Joanthony Hamza: Einar Gip Other Clinician: Referring Raylinn Kosar: Einar Gip Treating Valta Dillon/Extender: Frann Rider in Treatment: 8 Vital Signs Height(in): 63 Pulse(bpm): 83 Weight(lbs): 214.7 Blood Pressure 152/78 (mmHg): Body Mass Index(BMI): 38 Temperature(F): 98 Respiratory Rate 18 (breaths/min): Photos: [N/A:N/A] Wound Location: Right Lower Leg - Anterior N/A N/A Wounding Event: Gradually Appeared N/A N/A Primary Etiology: Venous Leg Ulcer N/A N/A Comorbid History: Chronic Obstructive N/A N/A Pulmonary Disease (COPD),  Hypertension, Rheumatoid Arthritis Date Acquired: 05/19/2016 N/A N/A Weeks of Treatment: 8 N/A N/A Wound Status: Open N/A N/A Measurements L x W x D 3x3.5x0.1 N/A N/A (cm) Area (cm) : 8.247 N/A N/A Volume (cm) : 0.825 N/A N/A % Reduction in Area: 53.90% N/A N/A % Reduction in Volume: 53.90% N/A N/A Classification: Full Thickness Without N/A N/A Exposed Support Structures Exudate Amount: Large N/A N/A Exudate Type: Serous N/A N/A Exudate Color: amber N/A N/A Wound Margin: Distinct, outline attached N/A N/A Granulation Amount: Medium (34-66%) N/A N/A Bottcher, Adonis K. ([id number]) Granulation Quality: Red, Hyper-granulation N/A N/A Necrotic Amount: None Present (0%) N/A N/A Exposed Structures: Fat Layer (Subcutaneous N/A N/A Tissue) Exposed: Yes Fascia: No Tendon: No Muscle: No Joint: No Bone: No Epithelialization: None N/A N/A Periwound Skin Texture: Scarring: Yes N/A N/A Periwound Skin No Abnormalities Noted N/A N/A Moisture: Periwound Skin Color: Ecchymosis: Yes N/A N/A Erythema: Yes Erythema Location: Circumferential N/A N/A Temperature: No Abnormality N/A N/A Tenderness on Yes N/A N/A Palpation: Wound Preparation: Ulcer Cleansing: N/A N/A Rinsed/Irrigated with Saline, Other: soap and water Topical Anesthetic Applied: Other: lidocaine 4% Procedures Performed: Compression Therapy N/A N/A Treatment Notes Wound #1 (Right, Anterior Lower Leg) 1. Cleansed with: Clean wound with Normal Saline Cleanse wound with antibacterial soap and water 2. Anesthetic Topical Lidocaine 4% cream to wound bed prior to debridement 3. Peri-wound Care: Barrier cream 4. Dressing Applied: Hydrafera Blue 5. Secondary Dressing Applied ABD Pad 7. Secured with 3 Layer Compression System - Right Lower Extremity Notes white dome paste JOVANNA, HODGES ([id number]) Electronic Signature(s) Signed: 01/13/2017 4:09:39 PM By: Christin Fudge MD, FACS Entered By: Christin Fudge on  01/13/2017 16:09:38 Mooradian, [name] ([id number]) -------------------------------------------------------------------------------- Dorris Details Patient Name: NGAN, QUALLS. Date of Service: 01/13/2017 3:30 PM Medical Record Number: [id number] Patient Account Number: [id number] Date of Birth/Sex: 28-Dec-1955 (60 y.o. Female) Treating RN: Cornell Barman Primary Care Eily Louvier: Einar Gip Other Clinician: Referring Kenslie Abbruzzese: Einar Gip Treating Kemi Gell/Extender: Frann Rider in Treatment: 8 Active Inactive ` Abuse / Safety / Falls / Self Care Management Nursing Diagnoses: Potential for falls Goals: Patient will remain injury free Date Initiated: 11/18/2016 Target Resolution Date: 01/20/2017 Goal Status: Active Interventions: Assess fall risk on admission and as needed Assess self care needs on admission and as needed Notes: ` Nutrition Nursing Diagnoses: Imbalanced nutrition Goals: Patient/caregiver agrees to and verbalizes understanding of need to use nutritional supplements and/or vitamins as prescribed Date Initiated: 11/18/2016 Target Resolution Date: 01/20/2017 Goal Status: Active Interventions: Assess patient nutrition upon admission and as needed per policy Notes: ` Orientation to the Wound Care Program Nursing Diagnoses: SAMAIRA, HOLZWORTH K. (  [id number]) Knowledge deficit related to the wound healing center program Goals: Patient/caregiver will verbalize understanding of the [place] Date Initiated: 11/18/2016 Target Resolution Date: 01/20/2017 Goal Status: Active Interventions: Provide education on orientation to the wound center Notes: ` Pain, Acute or Chronic Nursing Diagnoses: Pain, acute or chronic: actual or potential Potential alteration in comfort, pain Goals: Patient will verbalize adequate pain control and receive pain control interventions during procedures as needed Date Initiated: 11/18/2016 Target  Resolution Date: 01/20/2017 Goal Status: Active Patient/caregiver will verbalize adequate pain control between visits Date Initiated: 11/18/2016 Target Resolution Date: 01/20/2017 Goal Status: Active Patient/caregiver will verbalize comfort level met Date Initiated: 11/18/2016 Target Resolution Date: 01/20/2017 Goal Status: Active Interventions: Assess comfort goal upon admission Complete pain assessment as per visit requirements Notes: ` Soft Tissue Infection Nursing Diagnoses: Impaired tissue integrity Knowledge deficit related to disease process and management Knowledge deficit related to home infection control: handwashing, handling of soiled dressings, supply storage Goals: LASHAYLA, ARMES ([id number]) Patient/caregiver will verbalize understanding of or measures to prevent infection and contamination in the home setting Date Initiated: 11/18/2016 Target Resolution Date: 01/20/2017 Goal Status: Active Patient's soft tissue infection will resolve Date Initiated: 11/18/2016 Target Resolution Date: 01/20/2017 Goal Status: Active Interventions: Assess signs and symptoms of infection every visit Provide education on infection Treatment Activities: Education provided on Infection : 12/26/2016 Notes: ` Wound/Skin Impairment Nursing Diagnoses: Impaired tissue integrity Goals: Ulcer/skin breakdown will have a volume reduction of 30% by week 4 Date Initiated: 11/18/2016 Target Resolution Date: 01/20/2017 Goal Status: Active Ulcer/skin breakdown will have a volume reduction of 50% by week 8 Date Initiated: 11/18/2016 Target Resolution Date: 01/20/2017 Goal Status: Active Ulcer/skin breakdown will have a volume reduction of 80% by week 12 Date Initiated: 11/18/2016 Target Resolution Date: 01/20/2017 Goal Status: Active Interventions: Assess patient/caregiver ability to perform ulcer/skin care regimen upon admission and as needed Assess ulceration(s) every visit Provide education on  smoking Notes: Electronic Signature(s) Signed: 01/13/2017 5:10:05 PM By: Gretta Cool, RN, BSN, Kim RN, BSN Entered By: Gretta Cool, RN, BSN, Kim on 01/13/2017 15:40:22 Breece, [name] ([id number]) -------------------------------------------------------------------------------- Pain Assessment Details Patient Name: [name]. Date of Service: 01/13/2017 3:30 PM Medical Record Number: [id number] Patient Account Number: [id number] Date of Birth/Sex: 1956/01/22 (61 y.o. Female) Treating RN: Cornell Barman Primary Care Caius Silbernagel: Einar Gip Other Clinician: Referring Natha Guin: Einar Gip Treating Josalynn Johndrow/Extender: Frann Rider in Treatment: 8 Active Problems Location of Pain Severity and Description of Pain Patient Has Paino Yes Site Locations Pain Location: Pain in Ulcers With Dressing Change: Yes Duration of the Pain. Constant / Intermittento Constant Rate the pain. Current Pain Level: 7 Character of Pain Describe the Pain: Burning Pain Management and Medication Current Pain Management: Goals for Pain Management Topical or injectable lidocaine is offered to patient for acute pain when surgical debridement is performed. If needed, Patient is instructed to use over the counter pain medication for the following 24-48 hours after debridement. Wound care MDs do not prescribed pain medications. Patient has chronic pain or uncontrolled pain. Patient has been instructed to make an appointment with their Primary Care Physician for pain management. Electronic Signature(s) Signed: 01/13/2017 5:10:05 PM By: Gretta Cool, RN, BSN, Kim RN, BSN Entered By: Gretta Cool, RN, BSN, Kim on 01/13/2017 15:28:59 Pringle, [name] ([id number]) -------------------------------------------------------------------------------- Patient/Caregiver Education Details Patient Name: ALONAH, LINEBACK. Date of Service: 01/13/2017 3:30 PM Medical Record Number: [id number] Patient Account Number: [id number] Date of  Birth/Gender: 03/05/1956 (61 y.o. Female) Treating RN: Cornell Barman Primary Care  Physician: Einar Gip Other Clinician: Referring Physician: Einar Gip Treating Physician/Extender: Frann Rider in Treatment: 8 Education Assessment Education Provided To: Patient Education Topics Provided Venous: Handouts: Controlling Swelling with Multilayered Compression Wraps Methods: Demonstration Responses: State content correctly Electronic Signature(s) Signed: 01/13/2017 5:10:05 PM By: Gretta Cool, RN, BSN, Kim RN, BSN Entered By: Gretta Cool, RN, BSN, Kim on 01/13/2017 16:06:27 Chicoine, [name] ([id number]) -------------------------------------------------------------------------------- Wound Assessment Details Patient Name: RAYGAN, SKARDA. Date of Service: 01/13/2017 3:30 PM Medical Record Number: [id number] Patient Account Number: [id number] Date of Birth/Sex: 07/25/1956 (61 y.o. Female) Treating RN: Cornell Barman Primary Care Jaycen Vercher: Einar Gip Other Clinician: Referring Avish Torry: Einar Gip Treating Harlee Pursifull/Extender: Frann Rider in Treatment: 8 Wound Status Wound Number: 1 Primary Venous Leg Ulcer Etiology: Wound Location: Right Lower Leg - Anterior Wound Open Wounding Event: Gradually Appeared Status: Date Acquired: 05/19/2016 Comorbid Chronic Obstructive Pulmonary Weeks Of Treatment: 8 History: Disease (COPD), Hypertension, Clustered Wound: No Rheumatoid Arthritis Photos Wound Measurements Length: (cm) 3 Width: (cm) 3.5 Depth: (cm) 0.1 Area: (cm) 8.247 Volume: (cm) 0.825 % Reduction in Area: 53.9% % Reduction in Volume: 53.9% Epithelialization: None Tunneling: No Undermining: No Wound Description Full Thickness Without Exposed Classification: Support Structures Wound Margin: Distinct, outline attached Exudate Large Amount: Exudate Type: Serous Exudate Color: amber Foul Odor After Cleansing: No Wound Bed Granulation Amount: Medium (34-66%)  Exposed Structure Granulation Quality: Red, Hyper-granulation Fascia Exposed: No Necrotic Amount: None Present (0%) Fat Layer (Subcutaneous Tissue) Exposed: Yes Tendon Exposed: No Muscle Exposed: No Aoun, Perrie K. ([id number]) Joint Exposed: No Bone Exposed: No Periwound Skin Texture Texture Color No Abnormalities Noted: No No Abnormalities Noted: No Scarring: Yes Ecchymosis: Yes Erythema: Yes Moisture Erythema Location: Circumferential No Abnormalities Noted: No Temperature / Pain Temperature: No Abnormality Tenderness on Palpation: Yes Wound Preparation Ulcer Cleansing: Rinsed/Irrigated with Saline, Other: soap and water, Topical Anesthetic Applied: Other: lidocaine 4%, Treatment Notes Wound #1 (Right, Anterior Lower Leg) 1. Cleansed with: Clean wound with Normal Saline Cleanse wound with antibacterial soap and water 2. Anesthetic Topical Lidocaine 4% cream to wound bed prior to debridement 3. Peri-wound Care: Barrier cream 4. Dressing Applied: Hydrafera Blue 5. Secondary Dressing Applied ABD Pad 7. Secured with 3 Layer Compression System - Right Lower Extremity Notes white dome paste Electronic Signature(s) Signed: 01/13/2017 5:10:05 PM By: Gretta Cool, RN, BSN, Kim RN, BSN Entered By: Gretta Cool, RN, BSN, Kim on 01/13/2017 15:35:28 Berghuis, [name] ([id number]) -------------------------------------------------------------------------------- Amaya Details Patient Name: [name]. Date of Service: 01/13/2017 3:30 PM Medical Record Number: [id number] Patient Account Number: [id number] Date of Birth/Sex: 1955/12/20 (61 y.o. Female) Treating RN: Cornell Barman Primary Care Abubakar Crispo: Einar Gip Other Clinician: Referring Halo Shevlin: Einar Gip Treating Terrence Pizana/Extender: Frann Rider in Treatment: 8 Vital Signs Time Taken: 15:29 Temperature (F): 98 Height (in): 63 Pulse (bpm): 83 Weight (lbs): 214.7 Respiratory Rate (breaths/min): 18 Body Mass  Index (BMI): 38 Blood Pressure (mmHg): 152/78 Reference Range: 80 - 120 mg / dl Electronic Signature(s) Signed: 01/13/2017 5:10:05 PM By: Gretta Cool, RN, BSN, Kim RN, BSN Entered By: Gretta Cool, RN, BSN, Kim on 01/13/2017 15:29:21

## 2017-01-14 NOTE — Progress Notes (Signed)
[name], [name] ([id number]) Visit Report for 01/13/2017 Chief Complaint Document Details Patient Name: [name], [name]. Date of Service: 01/13/2017 3:30 PM Medical Record Number: [id number] Patient Account Number: [id number] Date of Birth/Sex: 1956-03-13 (61 y.o. Female) Treating RN: Huel Coventry Primary Care Provider: Noemi Chapel Other Clinician: Referring Provider: Noemi Chapel Treating Provider/Extender: Rudene Re in Treatment: 8 Information Obtained from: Patient Chief Complaint Patient presents for treatment of an open ulcer due to venous insufficiency to her right lower extremity which she's had for over 6 months Electronic Signature(s) Signed: 01/13/2017 4:09:45 PM By: Evlyn Kanner MD, FACS Entered By: Evlyn Kanner on 01/13/2017 16:09:45 Foss, [name] ([id number]) -------------------------------------------------------------------------------- HPI Details Patient Name: [name]. Date of Service: 01/13/2017 3:30 PM Medical Record Number: [id number] Patient Account Number: [id number] Date of Birth/Sex: [birth year] (61 y.o. Female) Treating RN: Huel Coventry Primary Care Provider: Noemi Chapel Other Clinician: Referring Provider: Noemi Chapel Treating Provider/Extender: Rudene Re in Treatment: 8 History of Present Illness Location: ulcerated area on the right lower extremity Quality: Patient reports experiencing a sharp pain to affected area(s). Severity: Patient states wound are getting worse. Duration: Patient has had the wound for > 6 months prior to seeking treatment at the wound center Timing: Pain in wound is constant (hurts all the time) Context: The wound appeared gradually over time Modifying Factors: Other treatment(s) tried include:recent admission to hospital for a cellulitis and was treated with antibiotics and an Unna boot Associated Signs and Symptoms: Patient reports having increase swelling. HPI Description: 61 year old  patient who was recently admitted to the hospital between 11/14/2016 and 11/15/2016 for right lower extremity ulcer with cellulitis. she was asked to stop clindamycin and doxycycline and her prednisone and take Levaquin 500 mg daily by mouth. she was treated for a right lower extremity possibly due to venous stasis versus vascular insufficiency versus infection. She received IV vancomycin during her hospital [name]. He is known to have rheumatoid arthritis and stopped taking her treatment because of lack of insurance. during her hospital [name] vascular studies were done -- ABIs were 0.88 on the right and 1.1 on the left, she had triphasic flow on both lower extremities and a plain film of the right tibia and fibula showed no signs of osteomyelitis. She is not a diabetic. She is a smoker however and continued to smoke. lab work noted that her sedimentation rate was 29, CRP was less than 0.8, hemoglobin A1c was 5.2 12/02/2016 -- due to a family emergency and social economic reasons she has been unable to keep her appointment with the vascular department for her workup and is expecting insurance coverage after January 1 12/19/2016 -- she continues to smoke. As have new insurance and hopefully will get her vascular test done as soon as possible. 12/26/2016 -- she has quit smoking for the last 3 days and I have commended her. She did receive her appointment from [facility] [place] and it is later this month 01/13/2017 -- she has not been back to see [place] since January 11 and had her compression wrap on all these days almost 3 weeks. I have sternly cautioned her regarding this. Her appointment for a venous duplex study is next Thursday Electronic Signature(s) Signed: 01/13/2017 4:10:25 PM By: Evlyn Kanner MD, FACS Entered By: Evlyn Kanner on 01/13/2017 16:10:24 [name], [name] ([id number]) -------------------------------------------------------------------------------- Physical Exam  Details Patient Name: [name]. Date of Service: 01/13/2017 3:30 PM Medical Record Number: [id number] Patient Account Number: [id number] Date of Birth/Sex: 1956-07-30 (60  y.o. Female) Treating RN: Huel Coventry Primary Care Provider: Noemi Chapel Other Clinician: Referring Provider: Noemi Chapel Treating Provider/Extender: Rudene Re in Treatment: 8 Constitutional . Pulse regular. Respirations normal and unlabored. Afebrile. . Eyes Nonicteric. Reactive to light. Ears, Nose, Mouth, and Throat Lips, teeth, and gums WNL.Marland Kitchen Moist mucosa without lesions. Neck supple and nontender. No palpable supraclavicular or cervical adenopathy. Normal sized without goiter. Respiratory WNL. No retractions.. Breath sounds WNL, No rubs, rales, rhonchi, or wheeze.. Cardiovascular Heart rhythm and rate regular, no murmur or gallop.. Pedal Pulses WNL. No clubbing, cyanosis or edema. Chest Breasts symmetical and no nipple discharge.. Breast tissue WNL, no masses, lumps, or tenderness.. Lymphatic No adneopathy. No adenopathy. No adenopathy. Musculoskeletal Adexa without tenderness or enlargement.. Digits and nails w/o clubbing, cyanosis, infection, petechiae, ischemia, or inflammatory conditions.. Integumentary (Hair, Skin) No suspicious lesions. No crepitus or fluctuance. No peri-wound warmth or erythema. No masses.Marland Kitchen Psychiatric Judgement and insight Intact.. No evidence of depression, anxiety, or agitation.. Notes lymphedema is a bit better and the wound actually has hyper granulation tissue and no evidence of active cellulitis or necrotic debris. No Sharp debridement was required today Electronic Signature(s) Signed: 01/13/2017 4:11:14 PM By: Evlyn Kanner MD, FACS Entered By: Evlyn Kanner on 01/13/2017 16:11:13 [name], [name] ([id number]) -------------------------------------------------------------------------------- Physician Orders Details Patient Name: [name]. Date  of Service: 01/13/2017 3:30 PM Medical Record Number: [id number] Patient Account Number: [id number] Date of Birth/Sex: Jan 09, 1956 (61 y.o. Female) Treating RN: Huel Coventry Primary Care Provider: Noemi Chapel Other Clinician: Referring Provider: Noemi Chapel Treating Provider/Extender: Rudene Re in Treatment: 8 Verbal / Phone Orders: No Diagnosis Coding Wound Cleansing Wound #1 Right,Anterior Lower Leg o Clean wound with Normal Saline. o Cleanse wound with mild soap and water Anesthetic Wound #1 Right,Anterior Lower Leg o Topical Lidocaine 4% cream applied to wound bed prior to debridement Primary Wound Dressing Wound #1 Right,Anterior Lower Leg o Hydrafera Blue Secondary Dressing Wound #1 Right,Anterior Lower Leg o ABD pad o XtraSorb Dressing Change Frequency Wound #1 Right,Anterior Lower Leg o Change dressing every week - Nurse visit as needed. Follow-up Appointments Wound #1 Right,Anterior Lower Leg o Return Appointment in 1 week. o Nurse Visit as needed Edema Control Wound #1 Right,Anterior Lower Leg o 3 Layer Compression System - Right Lower Extremity Additional Orders / Instructions Wound #1 Right,Anterior Lower Leg o Stop Smoking o Increase protein intake. BLONNIE, CONSTANTINIDES ([id number]) Medications-please add to medication list. Wound #1 Right,Anterior Lower Leg o Other: - Vitamin C, Zinc, Vitamin A, MVI Electronic Signature(s) Signed: 01/13/2017 4:18:36 PM By: Evlyn Kanner MD, FACS Signed: 01/13/2017 5:10:05 PM By: Elliot Gurney RN, BSN, Kim RN, BSN Entered By: Elliot Gurney, RN, BSN, Kim on 01/13/2017 16:08:09 Fleisher, [name] ([id number]) -------------------------------------------------------------------------------- Problem List Details Patient Name: [name], [name]. Date of Service: 01/13/2017 3:30 PM Medical Record Number: [id number] Patient Account Number: [id number] Date of Birth/Sex: Sep 21, 1956 (61 y.o. 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/18/2016 Yes exposed I83.012 Varicose veins of right lower extremity with ulcer of calf 11/18/2016 Yes F17.218 Nicotine dependence, cigarettes, with other nicotine- 11/18/2016 Yes induced disorders M05.60 Rheumatoid arthritis of unspecified site with involvement 11/18/2016 Yes of other organs and systems Inactive Problems Resolved Problems Electronic Signature(s) Signed: 01/13/2017 4:09:34 PM By: Evlyn Kanner MD, FACS Entered By:  Marion Seese on 01/13/2017 16:09:34 [name], [name] ([id number]) -------------------------------------------------------------------------------- Progress Note Details Patient Name: [name], [name]. Date of Service: 01/13/2017 3:30 PM Medical Record Number: [id number] Patient Account Number: [id number] Date of Birth/Sex: Jun 03, 1956 (61 y.o. Female) Treating RN: Huel Coventry Primary Care Provider: Noemi Chapel Other Clinician: Referring Provider: Noemi Chapel Treating Provider/Extender: Rudene Re in Treatment: 8 Subjective Chief Complaint Information obtained from Patient Patient presents for treatment of an open ulcer due to venous insufficiency to her right lower extremity which she's had for over 6 months History of Present Illness (HPI) The following HPI elements were documented for the patient's wound: Location: ulcerated area on the right lower extremity Quality: Patient reports experiencing a sharp pain to affected area(s). Severity: Patient states wound are getting worse. Duration: Patient has had the wound for > 6 months prior to seeking treatment at the wound center Timing: Pain in wound is constant (hurts all the time) Context: The wound  appeared gradually over time Modifying Factors: Other treatment(s) tried include:recent admission to hospital for a cellulitis and was treated with antibiotics and an Unna boot Associated Signs and Symptoms: Patient reports having increase swelling. 61 year old patient who was recently admitted to the hospital between 11/14/2016 and 11/15/2016 for right lower extremity ulcer with cellulitis. she was asked to stop clindamycin and doxycycline and her prednisone and take Levaquin 500 mg daily by mouth. she was treated for a right lower extremity possibly due to venous stasis versus vascular insufficiency versus infection. She received IV vancomycin during her hospital [name]. He is known to have rheumatoid arthritis and stopped taking her treatment because of lack of insurance. during her hospital [name] vascular studies were done -- ABIs were 0.88 on the right and 1.1 on the left, she had triphasic flow on both lower extremities and a plain film of the right tibia and fibula showed no signs of osteomyelitis. She is not a diabetic. She is a smoker however and continued to smoke. lab work noted that her sedimentation rate was 29, CRP was less than 0.8, hemoglobin A1c was 5.2 12/02/2016 -- due to a family emergency and social economic reasons she has been unable to keep her appointment with the vascular department for her workup and is expecting insurance coverage after January 1 12/19/2016 -- she continues to smoke. As have new insurance and hopefully will get her vascular test done as soon as possible. 12/26/2016 -- she has quit smoking for the last 3 days and I have commended her. She did receive her appointment from [facility] and vascular [place] and it is later this month 01/13/2017 -- she has not been back to see [place] since January 11 and had her compression wrap on all these days almost 3 weeks. I have sternly cautioned her regarding this. Her appointment for a venous [name], [name].  ([id number]) duplex study is next Thursday Objective Constitutional Pulse regular. Respirations normal and unlabored. Afebrile. Vitals Time Taken: 3:29 PM, Height: 63 in, Weight: 214.7 lbs, BMI: 38, Temperature: 98 F, Pulse: 83 bpm, Respiratory Rate: 18 breaths/min, Blood Pressure: 152/78 mmHg. Eyes Nonicteric. Reactive to light. Ears, Nose, Mouth, and Throat Lips, teeth, and gums WNL.Marland Kitchen Moist mucosa without lesions. Neck supple and nontender. No palpable supraclavicular or cervical adenopathy. Normal sized without goiter. Respiratory WNL. No retractions.. Breath sounds WNL, No rubs, rales, rhonchi, or wheeze.. Cardiovascular Heart rhythm and rate regular, no murmur or gallop.. Pedal Pulses WNL. No clubbing, cyanosis or edema. Chest Breasts symmetical and no nipple discharge.. Breast tissue  WNL, no masses, lumps, or tenderness.. Lymphatic No adneopathy. No adenopathy. No adenopathy. Musculoskeletal Adexa without tenderness or enlargement.. Digits and nails w/o clubbing, cyanosis, infection, petechiae, ischemia, or inflammatory conditions.Marland Kitchen Psychiatric Judgement and insight Intact.. No evidence of depression, anxiety, or agitation.. General Notes: lymphedema is a bit better and the wound actually has hyper granulation tissue and no evidence of active cellulitis or necrotic debris. No Sharp debridement was required today Integumentary (Hair, Skin) No suspicious lesions. No crepitus or fluctuance. No peri-wound warmth or erythema. No masses.Marland Kitchen [name], [name] ([id number]) Wound #1 status is Open. Original cause of wound was Gradually Appeared. The wound is located on the Right,Anterior Lower Leg. The wound measures 3cm length x 3.5cm width x 0.1cm depth; 8.247cm^2 area and 0.825cm^3 volume. There is Fat Layer (Subcutaneous Tissue) Exposed exposed. There is no tunneling or undermining noted. There is a large amount of serous drainage noted. The wound margin is distinct with the outline  attached to the wound base. There is medium (34-66%) red granulation within the wound bed. There is no necrotic tissue within the wound bed. The periwound skin appearance exhibited: Scarring, Ecchymosis, Erythema. The surrounding wound skin color is noted with erythema which is circumferential. Periwound temperature was noted as No Abnormality. The periwound has tenderness on palpation. Assessment Active Problems ICD-10 L97.212 - Non-pressure chronic ulcer of right calf with fat layer exposed I83.012 - Varicose veins of right lower extremity with ulcer of calf F17.218 - Nicotine dependence, cigarettes, with other nicotine-induced disorders M05.60 - Rheumatoid arthritis of unspecified site with involvement of other organs and systems Procedures Wound #1 Wound #1 is a Venous Leg Ulcer located on the Right,Anterior Lower Leg . There was a Three Layer Compression Therapy Procedure with a pre-treatment ABI of 0.9 by Huel Coventry, RN. Post procedure Diagnosis Wound #1: Same as Pre-Procedure Plan Wound Cleansing: Wound #1 Right,Anterior Lower Leg: Clean wound with Normal Saline. Cleanse wound with mild soap and water Anesthetic: Wound #1 Right,Anterior Lower Leg: Topical Lidocaine 4% cream applied to wound bed prior to debridement [name], [name]. ([id number]) Primary Wound Dressing: Wound #1 Right,Anterior Lower Leg: Hydrafera Blue Secondary Dressing: Wound #1 Right,Anterior Lower Leg: ABD pad XtraSorb Dressing Change Frequency: Wound #1 Right,Anterior Lower Leg: Change dressing every week - Nurse visit as needed. Follow-up Appointments: Wound #1 Right,Anterior Lower Leg: Return Appointment in 1 week. Nurse Visit as needed Edema Control: Wound #1 Right,Anterior Lower Leg: 3 Layer Compression System - Right Lower Extremity Additional Orders / Instructions: Wound #1 Right,Anterior Lower Leg: Stop Smoking Increase protein intake. Medications-please add to medication list.: Wound  #1 Right,Anterior Lower Leg: Other: - Vitamin C, Zinc, Vitamin A, MVI I have recommended: 1. Hydroferra blue and a 3 layer Profore compression to be changed every week. Cautioned her not to leave the dressing on lumbar then a week 2. Elevation and exercise 3. venous duplex studies of both lower extremity to be done soon as her insurance coverage is achieved -- appointment still pending for next week 4. I have urged her to completely give up smoking. 5. regular visits to the wound center Electronic Signature(s) Signed: 01/13/2017 4:12:18 PM By: Evlyn Kanner MD, FACS Entered By: Evlyn Kanner on 01/13/2017 16:12:18 [place], [name] ([id number]) -------------------------------------------------------------------------------- SuperBill Details Patient Name: [name]. Date of Service: 01/13/2017 Medical Record Number: [id number] Patient Account Number: [id number] Date of Birth/Sex: [birth year] (61 y.o. 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 I83.012 Varicose veins of right lower extremity with ulcer of calf F17.218 Nicotine dependence, cigarettes, with other nicotine-induced disorders M05.60 Rheumatoid arthritis of unspecified site with involvement of other organs and systems Facility Procedures CPT4: Description Modifier Quantity Code [id number] (Facility Use Only) [id number] - APPLY MULTLAY COMPRS LWR RT 1 LEG Physician Procedures CPT4: Description Modifier Quantity Code [id number] 99213 - WC PHYS LEVEL 3 - EST PT 1 ICD-10 Description Diagnosis 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 layer exposed I83.012 Varicose veins of right lower extremity with ulcer of calf  M05.60 Rheumatoid arthritis of unspecified  site with involvement of other organs and systems Electronic Signature(s) Signed: 01/13/2017 4:12:28 PM By: Evlyn Kanner MD, FACS Entered By: Evlyn Kanner on 01/13/2017 16:12:28

## 2017-01-16 ENCOUNTER — Ambulatory Visit: Payer: BLUE CROSS/BLUE SHIELD

## 2017-01-16 ENCOUNTER — Ambulatory Visit (HOSPITAL_COMMUNITY)
Admission: RE | Admit: 2017-01-16 | Discharge: 2017-01-16 | Disposition: A | Discharge status: Home / Self Care | Payer: BLUE CROSS/BLUE SHIELD | Source: Ambulatory Visit | Attending: Vascular Surgery | Admitting: Vascular Surgery

## 2017-01-16 ENCOUNTER — Other Ambulatory Visit: Payer: Self-pay | Admitting: Surgery

## 2017-01-16 DIAGNOSIS — I83012 Varicose veins of right lower extremity with ulcer of calf: Secondary | ICD-10-CM | POA: Insufficient documentation

## 2017-01-16 DIAGNOSIS — L97219 Non-pressure chronic ulcer of right calf with unspecified severity: Secondary | ICD-10-CM

## 2017-01-16 DIAGNOSIS — L97212 Non-pressure chronic ulcer of right calf with fat layer exposed: Secondary | ICD-10-CM | POA: Diagnosis present

## 2017-01-21 ENCOUNTER — Other Ambulatory Visit: Payer: Self-pay | Admitting: Internal Medicine

## 2017-01-22 ENCOUNTER — Other Ambulatory Visit: Payer: Self-pay | Admitting: Internal Medicine

## 2017-01-23 ENCOUNTER — Encounter: Payer: BLUE CROSS/BLUE SHIELD | Attending: Surgery | Admitting: Surgery

## 2017-01-23 DIAGNOSIS — I10 Essential (primary) hypertension: Secondary | ICD-10-CM | POA: Diagnosis not present

## 2017-01-23 DIAGNOSIS — Z880 Allergy status to penicillin: Secondary | ICD-10-CM | POA: Insufficient documentation

## 2017-01-23 DIAGNOSIS — I83012 Varicose veins of right lower extremity with ulcer of calf: Secondary | ICD-10-CM | POA: Diagnosis not present

## 2017-01-23 DIAGNOSIS — M0560 Rheumatoid arthritis of unspecified site with involvement of other organs and systems: Secondary | ICD-10-CM | POA: Insufficient documentation

## 2017-01-23 DIAGNOSIS — J449 Chronic obstructive pulmonary disease, unspecified: Secondary | ICD-10-CM | POA: Insufficient documentation

## 2017-01-23 DIAGNOSIS — F17218 Nicotine dependence, cigarettes, with other nicotine-induced disorders: Secondary | ICD-10-CM | POA: Diagnosis not present

## 2017-01-23 DIAGNOSIS — L97212 Non-pressure chronic ulcer of right calf with fat layer exposed: Secondary | ICD-10-CM | POA: Insufficient documentation

## 2017-01-23 DIAGNOSIS — Z79899 Other long term (current) drug therapy: Secondary | ICD-10-CM | POA: Insufficient documentation

## 2017-01-25 NOTE — Progress Notes (Signed)
RANE, DUMM ([id number]) Visit Report for 01/23/2017 Chief Complaint Document Details Patient Name: [name], [name]. Date of Service: 01/23/2017 1:30 PM Medical Record Number: [id number] Patient Account Number: [id number] Date of Birth/Sex: May 13, 1956 (61 y.o. Female) Treating RN: Huel Coventry Primary Care Provider: Noemi Chapel Other Clinician: Referring Provider: Noemi Chapel Treating Provider/Extender: Rudene Re in Treatment: 9 Information Obtained from: Patient Chief Complaint Patient presents for treatment of an open ulcer due to venous insufficiency to her right lower extremity which she's had for over 6 months Electronic Signature(s) Signed: 01/23/2017 2:13:04 PM By: Evlyn Kanner MD, FACS Entered By: Evlyn Kanner on 01/23/2017 14:13:04 Stenglein, [name] ([id number]) -------------------------------------------------------------------------------- HPI Details Patient Name: [name]. Date of Service: 01/23/2017 1:30 PM Medical Record Number: [id number] Patient Account Number: [id number] Date of Birth/Sex: 1956-03-01 (61 y.o. Female) Treating RN: Huel Coventry Primary Care Provider: Noemi Chapel Other Clinician: Referring Provider: Noemi Chapel Treating Provider/Extender: Rudene Re in Treatment: 9 History of Present Illness Location: ulcerated area on the right lower extremity Quality: Patient reports experiencing a sharp pain to affected area(s). Severity: Patient states wound are getting worse. Duration: Patient has had the wound for > 6 months prior to seeking treatment at the wound center Timing: Pain in wound is constant (hurts all the time) Context: The wound appeared gradually over time Modifying Factors: Other treatment(s) tried include:recent admission to hospital for a cellulitis and was treated with antibiotics and an Unna boot Associated Signs and Symptoms: Patient reports having increase swelling. HPI Description: 61 year old patient  who was recently admitted to the hospital between 11/14/2016 and 11/15/2016 for right lower extremity ulcer with cellulitis. she was asked to stop clindamycin and doxycycline and her prednisone and take Levaquin 500 mg daily by mouth. she was treated for a right lower extremity possibly due to venous stasis versus vascular insufficiency versus infection. She received IV vancomycin during her hospital stay. He is known to have rheumatoid arthritis and stopped taking her treatment because of lack of insurance. during her hospital stay vascular studies were done -- ABIs were 0.88 on the right and 1.1 on the left, she had triphasic flow on both lower extremities and a plain film of the right tibia and fibula showed no signs of osteomyelitis. She is not a diabetic. She is a smoker however and continued to smoke. lab work noted that her sedimentation rate was 29, CRP was less than 0.8, hemoglobin A1c was 5.2 12/02/2016 -- due to a family emergency and social economic reasons she has been unable to keep her appointment with the vascular department for her workup and is expecting insurance coverage after January 1 12/19/2016 -- she continues to smoke. As have new insurance and hopefully will get her vascular test done as soon as possible. 12/26/2016 -- she has quit smoking for the last 3 days and I have commended her. She did receive her appointment from [facility] and vascular [place] and it is later this month 01/13/2017 -- she has not been back to see [place] since January 11 and had her compression wrap on all these days almost 3 weeks. I have sternly cautioned her regarding this. Her appointment for a venous duplex study is next Thursday. 01/23/2017 ---- lower extremity venous duplex reflux evaluation shows small saphenous vein is competent but there is evidence of great saphenous vein reflux in the right lower extremity with no evidence of DVT or SVT. There is also deep vein reflux in the right lower  extremity. A vascular consult was  recommended. Note the left side was not studied. Lower extremity arterial evaluation was done -- unable to obtain right dorsalis pedis or posterior tibial pressures due to an open wound. Posterior tibial and dorsalis pedis waveform appears strong and triphasic. Right TBI was 0.88. Left ABI was 1.21 [name], [name] ([id number]) Electronic Signature(s) Signed: 01/23/2017 2:13:47 PM By: Evlyn Kanner MD, FACS Entered By: Evlyn Kanner on 01/23/2017 14:13:46 Gilkey, [name] ([id number]) -------------------------------------------------------------------------------- Physical Exam Details Patient Name: [name], [name]. Date of Service: 01/23/2017 1:30 PM Medical Record Number: [id number] Patient Account Number: [id number] Date of Birth/Sex: 28-Aug-1956 (61 y.o. Female) Treating RN: Huel Coventry Primary Care Provider: Noemi Chapel Other Clinician: Referring Provider: Noemi Chapel Treating Provider/Extender: Rudene Re in Treatment: 9 Constitutional . Pulse regular. Respirations normal and unlabored. Afebrile. . Eyes Nonicteric. Reactive to light. Ears, Nose, Mouth, and Throat Lips, teeth, and gums WNL.Marland Kitchen Moist mucosa without lesions. Neck supple and nontender. No palpable supraclavicular or cervical adenopathy. Normal sized without goiter. Respiratory WNL. No retractions.. Breath sounds WNL, No rubs, rales, rhonchi, or wheeze.. Cardiovascular Heart rhythm and rate regular, no murmur or gallop.. Pedal Pulses WNL. No clubbing, cyanosis or edema. Chest Breasts symmetical and no nipple discharge.. Breast tissue WNL, no masses, lumps, or tenderness.. Lymphatic No adneopathy. No adenopathy. No adenopathy. Musculoskeletal Adexa without tenderness or enlargement.. Digits and nails w/o clubbing, cyanosis, infection, petechiae, ischemia, or inflammatory conditions.. Integumentary (Hair, Skin) No suspicious lesions. No crepitus or fluctuance. No  peri-wound warmth or erythema. No masses.Marland Kitchen Psychiatric Judgement and insight Intact.. No evidence of depression, anxiety, or agitation.. Notes the lymphedema has gone down a bit and the wound is looking clean with healthy granulation tissue and no evidence of surrounding cellulitis. No sharp debridement was required. Electronic Signature(s) Signed: 01/23/2017 2:14:11 PM By: Evlyn Kanner MD, FACS Entered By: Evlyn Kanner on 01/23/2017 14:14:10 Edgar, [name] ([id number]) -------------------------------------------------------------------------------- Physician Orders Details Patient Name: [name], [name]. Date of Service: 01/23/2017 1:30 PM Medical Record Number: [id number] Patient Account Number: [id number] Date of Birth/Sex: 1956/12/01 (61 y.o. Female) Treating RN: Huel Coventry Primary Care Provider: Noemi Chapel Other Clinician: Referring Provider: Noemi Chapel Treating Provider/Extender: Rudene Re in Treatment: 9 Verbal / Phone Orders: No Diagnosis Coding Wound Cleansing Wound #1 Right,Anterior Lower Leg o Clean wound with Normal Saline. o Cleanse wound with mild soap and water o May Shower, gently pat wound dry prior to applying new dressing. - Remove dressing 1 hour before next visit and shower. Anesthetic Wound #1 Right,Anterior Lower Leg o Topical Lidocaine 4% cream applied to wound bed prior to debridement Primary Wound Dressing Wound #1 Right,Anterior Lower Leg o Hydrafera Blue Secondary Dressing Wound #1 Right,Anterior Lower Leg o ABD pad Dressing Change Frequency Wound #1 Right,Anterior Lower Leg o Change dressing every week - Nurse visit as needed. Follow-up Appointments Wound #1 Right,Anterior Lower Leg o Return Appointment in 1 week. o Nurse Visit as needed Edema Control Wound #1 Right,Anterior Lower Leg o 4-Layer Compression System - Left Lower Extremity Additional Orders / Instructions Wound #1 Right,Anterior Lower  Leg o Stop Smoking [name], [name] ([id number]) o Increase protein intake. Medications-please add to medication list. Wound #1 Right,Anterior Lower Leg o Other: - Vitamin C, Zinc, Vitamin A, MVI Consults o Vascular - Studies completed MD consult for procedure Notes Aligraf Authorization Electronic Signature(s) Signed: 01/23/2017 4:21:54 PM By: Evlyn Kanner MD, FACS Signed: 01/23/2017 6:04:53 PM By: Elliot Gurney RN, BSN, Kim RN, BSN Entered By: Elliot Gurney, RN, BSN, Kim on 01/23/2017 14:14:36 Coury,  [name] ([id number]) -------------------------------------------------------------------------------- Problem List Details Patient Name: [name], [name]. Date of Service: 01/23/2017 1:30 PM Medical Record Number: [id number] Patient Account Number: [id number] Date of Birth/Sex: March 23, 1956 (61 y.o. 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 11/18/2016 Yes exposed I83.012 Varicose veins of right lower extremity with ulcer of calf 11/18/2016 Yes F17.218 Nicotine dependence, cigarettes, with other nicotine- 11/18/2016 Yes induced disorders M05.60 Rheumatoid arthritis of unspecified site with involvement 11/18/2016 Yes of other organs and systems Inactive Problems Resolved Problems Electronic Signature(s) Signed: 01/23/2017 2:12:53 PM By: Evlyn Kanner MD, FACS Entered By: Evlyn Kanner on 01/23/2017 14:12:52 Pearse, [name] ([id number]) -------------------------------------------------------------------------------- Progress Note Details Patient Name: [name]. Date of Service: 01/23/2017 1:30 PM Medical Record Number: [id number] Patient Account Number: [id number] Date of Birth/Sex: Feb 05, 1956 (61 y.o.  Female) Treating RN: Huel Coventry Primary Care Provider: Noemi Chapel Other Clinician: Referring Provider: Noemi Chapel Treating Provider/Extender: Rudene Re in Treatment: 9 Subjective Chief Complaint Information obtained from Patient Patient presents for treatment of an open ulcer due to venous insufficiency to her right lower extremity which she's had for over 6 months History of Present Illness (HPI) The following HPI elements were documented for the patient's wound: Location: ulcerated area on the right lower extremity Quality: Patient reports experiencing a sharp pain to affected area(s). Severity: Patient states wound are getting worse. Duration: Patient has had the wound for > 6 months prior to seeking treatment at the wound center Timing: Pain in wound is constant (hurts all the time) Context: The wound appeared gradually over time Modifying Factors: Other treatment(s) tried include:recent admission to hospital for a cellulitis and was treated with antibiotics and an Unna boot Associated Signs and Symptoms: Patient reports having increase swelling. 61 year old patient who was recently admitted to the hospital between 11/14/2016 and 11/15/2016 for right lower extremity ulcer with cellulitis. she was asked to stop clindamycin and doxycycline and her prednisone and take Levaquin 500 mg daily by mouth. she was treated for a right lower extremity possibly due to venous stasis versus vascular insufficiency versus infection. She received IV vancomycin during her hospital stay. He is known to have rheumatoid arthritis and stopped taking her treatment because of lack of insurance. during her hospital stay vascular studies were done -- ABIs were 0.88 on the right and 1.1 on the left, she had triphasic flow on both lower extremities and a plain film of the right tibia and fibula showed no signs of osteomyelitis. She is not a diabetic. She is a smoker however and continued to  smoke. lab work noted that her sedimentation rate was 29, CRP was less than 0.8, hemoglobin A1c was 5.2 12/02/2016 -- due to a family emergency and social economic reasons she has been unable to keep her appointment with the vascular department for her workup and is expecting insurance coverage after January 1 12/19/2016 -- she continues to smoke. As have new insurance and hopefully will get her vascular test done as soon as possible. 12/26/2016 -- she has quit smoking for the last 3 days and I have commended her. She did receive her appointment from vein and vascular [place] and it is later this month 01/13/2017 -- she has not been back to see [place] since January 11 and had her compression wrap on all these  days almost 3 weeks. I have sternly cautioned her regarding this. Her appointment for a venous [name], [name]. ([id number]) duplex study is next Thursday. 01/23/2017 ---- lower extremity venous duplex reflux evaluation shows small saphenous vein is competent but there is evidence of great saphenous vein reflux in the right lower extremity with no evidence of DVT or SVT. There is also deep vein reflux in the right lower extremity. A vascular consult was recommended. Note the left side was not studied. Lower extremity arterial evaluation was done -- unable to obtain right dorsalis pedis or posterior tibial pressures due to an open wound. Posterior tibial and dorsalis pedis waveform appears strong and triphasic. Right TBI was 0.88. Left ABI was 1.21 Objective Constitutional Pulse regular. Respirations normal and unlabored. Afebrile. Vitals Time Taken: 1:28 PM, Height: 63 in, Weight: 214.7 lbs, BMI: 38, Temperature: 97.8 F, Pulse: 68 bpm, Respiratory Rate: 16 breaths/min, Blood Pressure: 147/80 mmHg. Eyes Nonicteric. Reactive to light. Ears, Nose, Mouth, and Throat Lips, teeth, and gums WNL.Marland Kitchen Moist mucosa without lesions. Neck supple and nontender. No palpable supraclavicular or  cervical adenopathy. Normal sized without goiter. Respiratory WNL. No retractions.. Breath sounds WNL, No rubs, rales, rhonchi, or wheeze.. Cardiovascular Heart rhythm and rate regular, no murmur or gallop.. Pedal Pulses WNL. No clubbing, cyanosis or edema. Chest Breasts symmetical and no nipple discharge.. Breast tissue WNL, no masses, lumps, or tenderness.. Lymphatic No adneopathy. No adenopathy. No adenopathy. Musculoskeletal Adexa without tenderness or enlargement.. Digits and nails w/o clubbing, cyanosis, infection, petechiae, ischemia, or inflammatory conditions.Marland Kitchen Psychiatric [name], [name] ([id number]) Judgement and insight Intact.. No evidence of depression, anxiety, or agitation.. General Notes: the lymphedema has gone down a bit and the wound is looking clean with healthy granulation tissue and no evidence of surrounding cellulitis. No sharp debridement was required. Integumentary (Hair, Skin) No suspicious lesions. No crepitus or fluctuance. No peri-wound warmth or erythema. No masses.. Wound #1 status is Open. Original cause of wound was Gradually Appeared. The wound is located on the Right,Anterior Lower Leg. The wound measures 3cm length x 3.2cm width x 0.1cm depth; 7.54cm^2 area and 0.754cm^3 volume. There is Fat Layer (Subcutaneous Tissue) Exposed exposed. There is no tunneling or undermining noted. There is a large amount of serous drainage noted. The wound margin is distinct with the outline attached to the wound base. There is medium (34-66%) red granulation within the wound bed. There is a small (1-33%) amount of necrotic tissue within the wound bed including Adherent Slough. The periwound skin appearance exhibited: Excoriation, Scarring, Ecchymosis, Erythema. The surrounding wound skin color is noted with erythema which is circumferential. Periwound temperature was noted as No Abnormality. The periwound has tenderness on palpation. Assessment Active  Problems ICD-10 L97.212 - Non-pressure chronic ulcer of right calf with fat layer exposed I83.012 - Varicose veins of right lower extremity with ulcer of calf F17.218 - Nicotine dependence, cigarettes, with other nicotine-induced disorders M05.60 - Rheumatoid arthritis of unspecified site with involvement of other organs and systems at this stage the patient has finally had her workup completed both arterial duplex and venous reflux studies and she has varicose veins of the right lower extremity which need a vascular consult. She will benefit from endovenous ablation. I have had a detailed discussion with her and recommended: 1. Hydrofera Blue and a 4-layer compression with Profore. 2. I have discussed the details of her arterial and venous duplex study with her 3. Referred her to the vascular group in [facility] for endovenous ablation 4. Commended  her on continuing to do well with giving up smoking 5. Asked her to be regular with her was it's to the wound care center Plan Wound Cleansing: [name], [name]. ([id number]) Wound #1 Right,Anterior Lower Leg: Clean wound with Normal Saline. Cleanse wound with mild soap and water Anesthetic: Wound #1 Right,Anterior Lower Leg: Topical Lidocaine 4% cream applied to wound bed prior to debridement Primary Wound Dressing: Wound #1 Right,Anterior Lower Leg: Hydrafera Blue Secondary Dressing: Wound #1 Right,Anterior Lower Leg: ABD pad Dressing Change Frequency: Wound #1 Right,Anterior Lower Leg: Change dressing every week - Nurse visit as needed. Follow-up Appointments: Wound #1 Right,Anterior Lower Leg: Return Appointment in 1 week. Nurse Visit as needed Edema Control: Wound #1 Right,Anterior Lower Leg: 4-Layer Compression System - Left Lower Extremity Additional Orders / Instructions: Wound #1 Right,Anterior Lower Leg: Stop Smoking Increase protein intake. Medications-please add to medication list.: Wound #1 Right,Anterior Lower  Leg: Other: - Vitamin C, Zinc, Vitamin A, MVI General Notes: Aligraf Authorization at this stage the patient has finally had her workup completed both arterial duplex and venous reflux studies and she has varicose veins of the right lower extremity which need a vascular consult. She will benefit from endovenous ablation. I have had a detailed discussion with her and recommended: 1. Hydrofera Blue and a 4-layer compression with Profore. 2. I have discussed the details of her arterial and venous duplex study with her 3. Referred her to the vascular group in [facility] for endovenous ablation 4. Commended her on continuing to do well with giving up smoking 5. Asked her to be regular with her was it's to the wound care center Electronic Signature(s) Signed: 01/23/2017 2:15:56 PM By: Evlyn Kanner MD, FACS Entered By: Evlyn Kanner on 01/23/2017 14:15:56 Haring, [name] ([id number]) Cheever, [name] ([id number]) -------------------------------------------------------------------------------- SuperBill Details Patient Name: [name] Date of Service: 01/23/2017 Medical Record Number: [id number] Patient Account Number: [id number] Date of Birth/Sex: 1956-12-02 (61 y.o. Female) Treating RN: Huel Coventry Primary Care Provider: Noemi Chapel Other Clinician: Referring Provider: Noemi Chapel Treating Provider/Extender: Evlyn Kanner Service Line: Weeks in Treatment: 9 Diagnosis Coding ICD-10 Codes Code Description [phone] Non-pressure chronic ulcer of right calf with fat layer exposed I83.012 Varicose veins of right lower extremity with ulcer of calf F17.218 Nicotine dependence, cigarettes, with other nicotine-induced disorders M05.60 Rheumatoid arthritis of unspecified site with involvement of other organs and systems Facility Procedures CPT4: Description Modifier Quantity Code [phone] (Facility Use Only) [phone] - APPLY MULTLAY COMPRS LWR LT 1 LEG Physician Procedures CPT4:  Description Modifier Quantity Code [id number] - WC PHYS LEVEL 3 - EST PT 1 ICD-10 Description Diagnosis 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 layer exposed I83.012 Varicose veins of right lower extremity with ulcer of calf  M05.60 Rheumatoid arthritis of unspecified site with involvement of other organs and systems Electronic Signature(s) Signed: 01/23/2017 2:16:16 PM By: Evlyn Kanner MD, FACS Entered By: Evlyn Kanner on 01/23/2017 14:16:15

## 2017-01-25 NOTE — Progress Notes (Signed)
[name], [name] ([id number]) Visit Report for 01/23/2017 Arrival Information Details Patient Name: [name], [name]. Date of Service: 01/23/2017 1:30 PM Medical Record Number: [id number] Patient Account Number: [id number] Date of Birth/Sex: 1956/06/10 (61 y.o. Female) Treating RN: [name] Primary Care [name]: [name] Other Clinician: Referring [name]: [name] Treating [name]/Extender: [name] in Treatment: 9 Visit Information History Since Last Visit Added or deleted any medications: No Patient Arrived: Ambulatory Any new allergies or adverse reactions: No Arrival Time: 13:27 Had a fall or experienced change in No Accompanied By: self activities of daily living that may affect Transfer Assistance: None risk of falls: Patient Identification Verified: Yes Signs or symptoms of abuse/neglect since last No Secondary Verification Process Yes visito Completed: Hospitalized since last visit: No Patient Requires Transmission- No Has Dressing in Place as Prescribed: Yes Based Precautions: Has Compression in Place as Prescribed: Yes Patient Has Alerts: Yes Pain Present Now: No Patient Alerts: L Leg ABI 1.2 R Leg ABI 0.88 ABIs done in [place] Signature(s) Signed: 01/23/2017 6:04:53 PM By: Marilyn Cool, RN, [name], [name] Entered By: Marilyn Cool, RN, [name], [name] 13:28:36 [name], [name] ([id number]) -------------------------------------------------------------------------------- Encounter Discharge Information Details Patient Name: [name]. Date of Service: 01/23/2017 1:30 PM Medical Record Number: [id number] Patient Account Number: [id number] Date of Birth/Sex: August 20, 1956 (61 y.o. Female) Treating RN: [name] Primary Care Maryruth Apple: [name] Other Clinician: Referring Harjit Douds: [name] Treating [name]/Extender: [name] in Treatment: 9 Encounter Discharge Information Items Discharge Pain Level:  0 Discharge Condition: Stable Ambulatory Status: Ambulatory Discharge Destination: Home Transportation: Private Auto Accompanied By: self Schedule Follow-up Appointment: Yes Medication Reconciliation completed and provided to Patient/Care Yes [name]: Provided on Clinical Summary of Care: 01/23/2017 Form Type Recipient Paper Patient [name] Electronic Signature(s) Signed: 01/23/2017 6:04:53 PM By: Marilyn Cool RN, [name], [name] Previous Signature: 01/23/2017 2:10:03 PM Version By: [name] Entered By: Marilyn Cool RN, [name], [name] 14:15:28 [name] ([id number]) -------------------------------------------------------------------------------- Lower Extremity Assessment Details Patient Name: [name], [name]. Date of Service: 01/23/2017 1:30 PM Medical Record Number: [id number] Patient Account Number: [id number] Date of Birth/Sex: 1956/07/11 (61 y.o. Female) Treating RN: [name] Primary Care Mollee Neer: [name] Other Clinician: Referring [name]: [name] Treating [name]/Extender: [name] in Treatment: 9 Edema Assessment Assessed: [Left: No] [Right: No] E[Left: dema] [Right: :] Calf Left: Right: Point of Measurement: 32 cm From Medial Instep cm 42.5 cm Ankle Left: Right: Point of Measurement: 9 cm From Medial Instep cm 25 cm Vascular Assessment Claudication: Claudication Assessment [Right:None] Pulses: Dorsalis Pedis Palpable: [Right:Yes] Posterior Tibial Extremity colors, hair growth, and conditions: Extremity Color: [Right:Hyperpigmented] Hair Growth on Extremity: [Right:Yes] Temperature of Extremity: [Right:Warm] Capillary Refill: [Right:> 3 seconds] Dependent Rubor: [Right:No] Blanched when Elevated: [Right:No] Lipodermatosclerosis: [Right:No] Toe Nail Assessment Left: Right: Thick: Yes Discolored: Yes Deformed: Yes Improper Length and Hygiene: Yes [name] ([id number]) Electronic Signature(s) Signed: 01/23/2017  6:04:53 PM By: Marilyn Cool, RN, [name], [name] Entered By: Marilyn Cool, RN, [name], [name] 13:38:14 [name], [name] ([id number]) -------------------------------------------------------------------------------- Multi Wound Chart Details Patient Name: [name]. Date of Service: 01/23/2017 1:30 PM Medical Record Number: [id number] Patient Account Number: [id number] Date of Birth/Sex: 05/26/1956 (61 y.o. Female) Treating RN: [name] Primary Care [name]: [name] Other Clinician: Referring [name]: [name] Treating [name]/Extender: [name] in Treatment: 9 Vital Signs Height(in): 63 Pulse(bpm): 68 Weight(lbs): 214.7 Blood Pressure 147/80 (mmHg): Body Mass Index(BMI): 38 Temperature(F): 97.8 Respiratory Rate 16 (breaths/min):  Photos: [N/A:N/A] Wound Location: Right Lower Leg - Anterior N/A N/A Wounding Event: Gradually Appeared N/A N/A Primary Etiology: Venous Leg Ulcer N/A N/A Comorbid History: Chronic Obstructive N/A N/A Pulmonary Disease (COPD), Hypertension, Rheumatoid Arthritis Date Acquired: 05/19/2016 N/A N/A Weeks of Treatment: 9 N/A N/A Wound Status: Open N/A N/A Measurements L x W x D 3x3.2x0.1 N/A N/A (cm) Area (cm) : 7.54 N/A N/A Volume (cm) : 0.754 N/A N/A % Reduction in Area: 57.90% N/A N/A % Reduction in Volume: 57.90% N/A N/A Classification: Full Thickness Without N/A N/A Exposed Support Structures Exudate Amount: Large N/A N/A Exudate Type: Serous N/A N/A Exudate Color: amber N/A N/A Wound Margin: Distinct, outline attached N/A N/A Granulation Amount: Medium (34-66%) N/A N/A Say, [name] ([id number]) Granulation Quality: Red, Hyper-granulation N/A N/A Necrotic Amount: Small (1-33%) N/A N/A Exposed Structures: Fat Layer (Subcutaneous N/A N/A Tissue) Exposed: Yes Fascia: No Tendon: No Muscle: No Joint: No Bone: No Epithelialization: Small (1-33%) N/A N/A Periwound Skin Texture: Excoriation: Yes N/A  N/A Scarring: Yes Periwound Skin No Abnormalities Noted N/A N/A Moisture: Periwound Skin Color: Ecchymosis: Yes N/A N/A Erythema: Yes Erythema Location: Circumferential N/A N/A Temperature: No Abnormality N/A N/A Tenderness on Yes N/A N/A Palpation: Wound Preparation: Ulcer Cleansing: N/A N/A Rinsed/Irrigated with Saline, Other: soap and water Topical Anesthetic Applied: Other: lidocaine 4% Treatment Notes Electronic Signature(s) Signed: 01/23/2017 2:12:57 PM By: Christin Fudge MD, FACS Entered By: Christin Fudge on [name] 14:12:57 Zazueta, [name] ([id number]) -------------------------------------------------------------------------------- [place] Details Patient Name: [name], [name]. Date of Service: 01/23/2017 1:30 PM Medical Record Number: [id number] Patient Account Number: [id number] Date of Birth/Sex: 01/10/1956 (61 y.o. Female) Treating RN: [name] Primary Care Ebbie Cherry: [name] Other Clinician: Referring Sedonia Kitner: [name] Treating Tatelyn Vanhecke/Extender: [name] in Treatment: 9 Active Inactive ` Abuse / Safety / Falls / Self Care Management Nursing Diagnoses: Potential for falls Goals: Patient will remain injury free Date Initiated: 11/18/2016 Target Resolution Date: 01/20/2017 Goal Status: Active Interventions: Assess fall risk on admission and as needed Assess self care needs on admission and as needed Notes: ` Nutrition Nursing Diagnoses: Imbalanced nutrition Goals: Patient/caregiver agrees to and verbalizes understanding of need to use nutritional supplements and/or vitamins as prescribed Date Initiated: 11/18/2016 Target Resolution Date: 01/20/2017 Goal Status: Active Interventions: Assess patient nutrition upon admission and as needed per policy Notes: ` Orientation to the Wound Care Program Nursing Diagnoses: SALEAH, RISHEL ([id number]) Knowledge deficit related to the wound healing center  program Goals: Patient/caregiver will verbalize understanding of the [place] Date Initiated: 11/18/2016 Target Resolution Date: 01/20/2017 Goal Status: Active Interventions: Provide education on orientation to the wound center Notes: ` Pain, Acute or Chronic Nursing Diagnoses: Pain, acute or chronic: actual or potential Potential alteration in comfort, pain Goals: Patient will verbalize adequate pain control and receive pain control interventions during procedures as needed Date Initiated: 11/18/2016 Target Resolution Date: 01/20/2017 Goal Status: Active Patient/caregiver will verbalize adequate pain control between visits Date Initiated: 11/18/2016 Target Resolution Date: 01/20/2017 Goal Status: Active Patient/caregiver will verbalize comfort level met Date Initiated: 11/18/2016 Target Resolution Date: 01/20/2017 Goal Status: Active Interventions: Assess comfort goal upon admission Complete pain assessment as per visit requirements Notes: ` Soft Tissue Infection Nursing Diagnoses: Impaired tissue integrity Knowledge deficit related to disease process and management Knowledge deficit related to home infection control: handwashing, handling of soiled dressings, supply storage Goals: MILDRETH, REEK ([id number]) Patient/caregiver will verbalize understanding of or measures to prevent infection and contamination in the  home setting Date Initiated: 11/18/2016 Target Resolution Date: 01/20/2017 Goal Status: Active Patient's soft tissue infection will resolve Date Initiated: 11/18/2016 Target Resolution Date: 01/20/2017 Goal Status: Active Interventions: Assess signs and symptoms of infection every visit Provide education on infection Treatment Activities: Education provided on Infection : 11/18/2016 Notes: ` Wound/Skin Impairment Nursing Diagnoses: Impaired tissue integrity Goals: Ulcer/skin breakdown will have a volume reduction of 30% by week 4 Date  Initiated: 11/18/2016 Target Resolution Date: 01/20/2017 Goal Status: Active Ulcer/skin breakdown will have a volume reduction of 50% by week 8 Date Initiated: 11/18/2016 Target Resolution Date: 01/20/2017 Goal Status: Active Ulcer/skin breakdown will have a volume reduction of 80% by week 12 Date Initiated: 11/18/2016 Target Resolution Date: 01/20/2017 Goal Status: Active Interventions: Assess patient/caregiver ability to perform ulcer/skin care regimen upon admission and as needed Assess ulceration(s) every visit Provide education on smoking Notes: Electronic Signature(s) Signed: 01/23/2017 6:04:53 PM By: Marilyn Cool, RN, [name], [name] Entered By: Marilyn Cool, RN, [name], [name] 13:38:19 [place], [name] ([id number]) -------------------------------------------------------------------------------- Pain Assessment Details Patient Name: [name]. Date of Service: 01/23/2017 1:30 PM Medical Record Number: [id number] Patient Account Number: [id number] Date of Birth/Sex: [birth year] (61 y.o. Female) Treating RN: [name] Primary Care Judea Fennimore: [name] Other Clinician: Referring Brock Mokry: [name] Treating Quantrell Splitt/Extender: [name] in Treatment: 9 Active Problems Location of Pain Severity and Description of Pain Patient Has Paino No Site Locations With Dressing Change: No Duration of the Pain. Constant / Intermittento Intermittent Rate the pain. Current Pain Level: 7 Worst Pain Level: 8 Character of Pain Describe the Pain: Sharp, Tender, Throbbing Pain Management and Medication Current Pain Management: Electronic Signature(s) Signed: 01/23/2017 6:04:53 PM By: Marilyn Cool, RN, [name], [name] Entered By: Marilyn Cool, RN, [name], [name] 13:28:54 Graddy, [name] ([id number]) -------------------------------------------------------------------------------- Patient/Caregiver Education Details Patient Name: [name]. Date of Service: 01/23/2017 1:30  PM Medical Record Number: [id number] Patient Account Number: [id number] Date of Birth/Gender: 1956-09-25 (61 y.o. Female) Treating RN: [name] Primary Care Physician: [name] Other Clinician: Referring Physician: Einar [name] Treating Physician/Extender: [name] in Treatment: 9 Education Assessment Education Provided To: Patient Education Topics Provided Venous: Handouts: Controlling Swelling with Multilayered Compression Wraps Methods: Demonstration Responses: State content correctly Welcome [facility]: Electronic Signature(s) Signed: 01/23/2017 6:04:53 PM By: Marilyn Cool, RN, [name], [name] Entered By: Marilyn Cool, RN, [name], [name] 14:15:49 Festa, [name] ([id number]) -------------------------------------------------------------------------------- Wound Assessment Details Patient Name: [name], [name]. Date of Service: 01/23/2017 1:30 PM Medical Record Number: [id number] Patient Account Number: [id number] Date of Birth/Sex: Apr 30, 1956 (61 y.o. Female) Treating RN: [name] Primary Care Kadarius Cuffe: [name] Other Clinician: Referring Johnwilliam Shepperson: [name] Treating Belynda Pagaduan/Extender: [name] in Treatment: 9 Wound Status Wound Number: 1 Primary Venous Leg Ulcer Etiology: Wound Location: Right Lower Leg - Anterior Wound Open Wounding Event: Gradually Appeared Status: Date Acquired: 05/19/2016 Comorbid Chronic Obstructive Pulmonary Weeks Of Treatment: 9 History: Disease (COPD), Hypertension, Clustered Wound: No Rheumatoid Arthritis Photos Wound Measurements Length: (cm) 3 % Reduction in A Width: (cm) 3.2 % Reduction in V Depth: (cm) 0.1 Epithelializatio Area: (cm) 7.54 Tunneling: Volume: (cm) 0.754 Undermining: rea: 57.9% olume: 57.9% n: Small (1-33%) No No Wound Description Full Thickness Without Exposed Classification: Support Structures Wound Margin: Distinct, outline  attached Exudate Large Amount: Exudate Type: Serous Exudate Color: amber Foul Odor After Cleansing: No Wound Bed Granulation Amount: Medium (34-66%) Exposed Structure Granulation Quality: Red, Hyper-granulation Fascia Exposed: No Necrotic Amount: Small (  1-33%) Fat Layer (Subcutaneous Tissue) Exposed: Yes Necrotic Quality: Adherent Slough Tendon Exposed: No Muscle Exposed: No Stansbery, Carlea K. ([id number]) Joint Exposed: No Bone Exposed: No Periwound Skin Texture Texture Color No Abnormalities Noted: No No Abnormalities Noted: No Excoriation: Yes Ecchymosis: Yes Scarring: Yes Erythema: Yes Erythema Location: Circumferential Moisture No Abnormalities Noted: No Temperature / Pain Temperature: No Abnormality Tenderness on Palpation: Yes Wound Preparation Ulcer Cleansing: Rinsed/Irrigated with Saline, Other: soap and water, Topical Anesthetic Applied: Other: lidocaine 4%, Treatment Notes Wound #1 (Right, Anterior Lower Leg) 1. Cleansed with: Cleanse wound with antibacterial soap and water 2. Anesthetic Topical Lidocaine 4% cream to wound bed prior to debridement 3. Peri-wound Care: Barrier cream 4. Dressing Applied: Hydrafera Blue 5. Secondary Dressing Applied ABD Pad 7. Secured with 4-Layer Compression System - Left Lower Extremity Notes white dome paste anchor Electronic Signature(s) Signed: 01/23/2017 6:04:53 PM By: Marilyn Cool, RN, [name], [name] Entered By: Marilyn Cool, RN, [name], [name] 13:36:52 [place], [name] ([id number]) -------------------------------------------------------------------------------- [place] Details Patient Name: [name]. Date of Service: 01/23/2017 1:30 PM Medical Record Number: [id number] Patient Account Number: [id number] Date of Birth/Sex: 05/02/1956 (61 y.o. Female) Treating RN: [name] Primary Care Lita Flynn: [name] Other Clinician: Referring Lynlee Stratton: [name] Treating Kolsen Choe/Extender: [name] in Treatment: 9 Vital Signs Time Taken: 13:28 Temperature (F): 97.8 Height (in): 63 Pulse (bpm): 68 Weight (lbs): 214.7 Respiratory Rate (breaths/min): 16 Body Mass Index (BMI): 38 Blood Pressure (mmHg): 147/80 Reference Range: 80 - 120 mg / dl Electronic Signature(s) Signed: 01/23/2017 6:04:53 PM By: Marilyn Cool, RN, [name], [name] Entered By: Marilyn Cool, RN, [name], [name] 13:29:15

## 2017-01-30 ENCOUNTER — Encounter: Payer: BLUE CROSS/BLUE SHIELD | Admitting: Surgery

## 2017-01-30 DIAGNOSIS — L97212 Non-pressure chronic ulcer of right calf with fat layer exposed: Secondary | ICD-10-CM | POA: Diagnosis not present

## 2017-02-01 NOTE — Progress Notes (Signed)
KETZALY, GRIESEL ([id number]) Visit Report for 01/30/2017 Chief Complaint Document Details Patient Name: [name], [name]. Date of Service: 01/30/2017 8:00 AM Medical Record Number: [id number] Patient Account Number: [id number] Date of Birth/Sex: Dec 19, 1955 (61 y.o. Female) Treating RN: Ashok Cordia, Debi Primary Care Provider: Noemi Chapel Other Clinician: Referring Provider: Noemi Chapel Treating Provider/Extender: Rudene Re in Treatment: 10 Information Obtained from: Patient Chief Complaint Patient presents for treatment of an open ulcer due to venous insufficiency to her right lower extremity which she's had for over 6 months Electronic Signature(s) Signed: 01/30/2017 8:22:13 AM By: Evlyn Kanner MD, FACS Entered By: Evlyn Kanner on 01/30/2017 08:22:13 Ging, [name] ([id number]) -------------------------------------------------------------------------------- HPI Details Patient Name: [name]. Date of Service: 01/30/2017 8:00 AM Medical Record Number: [id number] Patient Account Number: [id number] Date of Birth/Sex: 1956/02/06 (61 y.o. Female) Treating RN: Ashok Cordia, Debi Primary Care Provider: Noemi Chapel Other Clinician: Referring Provider: Noemi Chapel Treating Provider/Extender: Rudene Re in Treatment: 10 History of Present Illness Location: ulcerated area on the right lower extremity Quality: Patient reports experiencing a sharp pain to affected area(s). Severity: Patient states wound are getting worse. Duration: Patient has had the wound for > 6 months prior to seeking treatment at the wound center Timing: Pain in wound is constant (hurts all the time) Context: The wound appeared gradually over time Modifying Factors: Other treatment(s) tried include:recent admission to hospital for a cellulitis and was treated with antibiotics and an Unna boot Associated Signs and Symptoms: Patient reports having increase swelling. HPI Description:  61 year old patient who was recently admitted to the hospital between 11/14/2016 and 11/15/2016 for right lower extremity ulcer with cellulitis. she was asked to stop clindamycin and doxycycline and her prednisone and take Levaquin 500 mg daily by mouth. she was treated for a right lower extremity possibly due to venous stasis versus vascular insufficiency versus infection. She received IV vancomycin during her hospital stay. He is known to have rheumatoid arthritis and stopped taking her treatment because of lack of insurance. during her hospital stay vascular studies were done -- ABIs were 0.88 on the right and 1.1 on the left, she had triphasic flow on both lower extremities and a plain film of the right tibia and fibula showed no signs of osteomyelitis. She is not a diabetic. She is a smoker however and continued to smoke. lab work noted that her sedimentation rate was 29, CRP was less than 0.8, hemoglobin A1c was 5.2 12/02/2016 -- due to a family emergency and social economic reasons she has been unable to keep her appointment with the vascular department for her workup and is expecting insurance coverage after January 1 12/19/2016 -- she continues to smoke. As have new insurance and hopefully will get her vascular test done as soon as possible. 12/26/2016 -- she has quit smoking for the last 3 days and I have commended her. She did receive her appointment from [facility] [place] and it is later this month 01/13/2017 -- she has not been back to see [place] since January 11 and had her compression wrap on all these days almost 3 weeks. I have sternly cautioned her regarding this. Her appointment for a venous duplex study is next Thursday. 01/23/2017 ---- lower extremity venous duplex reflux evaluation shows small saphenous vein is competent but there is evidence of great saphenous vein reflux in the right lower extremity with no evidence of DVT or SVT. There is also deep vein reflux in  the right lower extremity. A vascular consult was  recommended. Note the left side was not studied. Lower extremity arterial evaluation was done -- unable to obtain right dorsalis pedis or posterior tibial pressures due to an open wound. Posterior tibial and dorsalis pedis waveform appears strong and triphasic. Right TBI was 0.88. Left ABI was 1.21. KONA, YUSUF ([id number]) 01/30/2017 -- he has an appointment to see the vascular surgeons at [facility] this coming Thursday. her Apligraf has been authorized by her [employer] and we will get this for her for next week. Electronic Signature(s) Signed: 01/30/2017 8:23:26 AM By: Evlyn Kanner MD, FACS Previous Signature: 01/30/2017 8:22:38 AM Version By: Evlyn Kanner MD, FACS Entered By: Evlyn Kanner on 01/30/2017 08:23:26 [name] ([id number]) -------------------------------------------------------------------------------- Physical Exam Details Patient Name: Marilyn, [name]. Date of Service: 01/30/2017 8:00 AM Medical Record Number: [id number] Patient Account Number: [id number] Date of Birth/Sex: 11-30-1956 (61 y.o. Female) Treating RN: Ashok Cordia, Debi Primary Care Provider: Noemi Chapel Other Clinician: Referring Provider: Noemi Chapel Treating Provider/Extender: Rudene Re in Treatment: 10 Constitutional . Pulse regular. Respirations normal and unlabored. Afebrile. . Eyes Nonicteric. Reactive to light. Ears, Nose, Mouth, and Throat Lips, teeth, and gums WNL.Marland Kitchen Moist mucosa without lesions. Neck supple and nontender. No palpable supraclavicular or cervical adenopathy. Normal sized without goiter. Respiratory WNL. No retractions.. Breath sounds WNL, No rubs, rales, rhonchi, or wheeze.. Cardiovascular Heart rhythm and rate regular, no murmur or gallop.. Pedal Pulses WNL. No clubbing, cyanosis or edema. Chest Breasts symmetical and no nipple discharge.. Breast tissue WNL, no masses, lumps, or  tenderness.. Lymphatic No adneopathy. No adenopathy. No adenopathy. Musculoskeletal Adexa without tenderness or enlargement.. Digits and nails w/o clubbing, cyanosis, infection, petechiae, ischemia, or inflammatory conditions.. Integumentary (Hair, Skin) No suspicious lesions. No crepitus or fluctuance. No peri-wound warmth or erythema. No masses.Marland Kitchen Psychiatric Judgement and insight Intact.. No evidence of depression, anxiety, or agitation.. Notes she has good resolution of her lymphedema and her hyper granulation tissue looks better and no sharp debridement was required today Electronic Signature(s) Signed: 01/30/2017 8:22:57 AM By: Evlyn Kanner MD, FACS Entered By: Evlyn Kanner on 01/30/2017 08:22:57 Quint, [name] ([id number]) -------------------------------------------------------------------------------- Physician Orders Details Patient Name: [name]. Date of Service: 01/30/2017 8:00 AM Medical Record Number: [id number] Patient Account Number: [id number] Date of Birth/Sex: 07/11/1956 (60 y.o. Female) Treating RN: Ashok Cordia, Debi Primary Care Provider: Noemi Chapel Other Clinician: Referring Provider: Noemi Chapel Treating Provider/Extender: Rudene Re in Treatment: 10 Verbal / Phone Orders: Yes Clinician: Pinkerton, Debi Read Back and Verified: Yes Diagnosis Coding Wound Cleansing Wound #1 Right,Anterior Lower Leg o Clean wound with Normal Saline. o Cleanse wound with mild soap and water o May Shower, gently pat wound dry prior to applying new dressing. - Remove dressing 1 hour before next visit and shower. Anesthetic Wound #1 Right,Anterior Lower Leg o Topical Lidocaine 4% cream applied to wound bed prior to debridement Primary Wound Dressing Wound #1 Right,Anterior Lower Leg o Hydrafera Blue Secondary Dressing Wound #1 Right,Anterior Lower Leg o ABD pad Dressing Change Frequency Wound #1 Right,Anterior Lower Leg o Change  dressing every week - Nurse visit as needed. Follow-up Appointments Wound #1 Right,Anterior Lower Leg o Return Appointment in 1 week. o Nurse Visit as needed Edema Control Wound #1 Right,Anterior Lower Leg o 4-Layer Compression System - Left Lower Extremity Additional Orders / Instructions Wound #1 Right,Anterior Lower Leg o Stop Smoking Manson, Jennavie K. ([id number]) o Increase protein intake. Medications-please add to medication list. Wound #1 Right,Anterior Lower Leg o Other: - Vitamin C, Zinc, Vitamin  A, MVI Notes order apligraph for pt Electronic Signature(s) Signed: 01/30/2017 4:07:08 PM By: Evlyn Kanner MD, FACS Signed: 01/31/2017 4:03:50 PM By: Alejandro Mulling Entered By: Alejandro Mulling on 01/30/2017 10:02:55 Base, [name] ([id number]) -------------------------------------------------------------------------------- Problem List Details Patient Name: BROOKIE, WAYMENT. Date of Service: 01/30/2017 8:00 AM Medical Record Number: [id number] Patient Account Number: [id number] Date of Birth/Sex: [birth year] (60 y.o. 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 11/18/2016 Yes exposed I83.012 Varicose veins of right lower extremity with ulcer of calf 11/18/2016 Yes F17.218 Nicotine dependence, cigarettes, with other nicotine- 11/18/2016 Yes induced disorders M05.60 Rheumatoid arthritis of unspecified site with involvement 11/18/2016 Yes of other organs and systems Inactive Problems Resolved Problems Electronic Signature(s) Signed: 01/30/2017 8:22:05 AM By: Evlyn Kanner MD, FACS Entered By: Evlyn Kanner on 01/30/2017 08:22:04 Garant, [name]  ([id number]) -------------------------------------------------------------------------------- Progress Note Details Patient Name: [name]. Date of Service: 01/30/2017 8:00 AM Medical Record Number: [id number] Patient Account Number: [id number] Date of Birth/Sex: [birth year] (60 y.o. Female) Treating RN: Ashok Cordia, Debi Primary Care Provider: Noemi Chapel Other Clinician: Referring Provider: Noemi Chapel Treating Provider/Extender: Rudene Re in Treatment: 10 Subjective Chief Complaint Information obtained from Patient Patient presents for treatment of an open ulcer due to venous insufficiency to her right lower extremity which she's had for over 6 months History of Present Illness (HPI) The following HPI elements were documented for the patient's wound: Location: ulcerated area on the right lower extremity Quality: Patient reports experiencing a sharp pain to affected area(s). Severity: Patient states wound are getting worse. Duration: Patient has had the wound for > 6 months prior to seeking treatment at the wound center Timing: Pain in wound is constant (hurts all the time) Context: The wound appeared gradually over time Modifying Factors: Other treatment(s) tried include:recent admission to hospital for a cellulitis and was treated with antibiotics and an Unna boot Associated Signs and Symptoms: Patient reports having increase swelling. 61 year old patient who was recently admitted to the hospital between 11/14/2016 and 11/15/2016 for right lower extremity ulcer with cellulitis. she was asked to stop clindamycin and doxycycline and her prednisone and take Levaquin 500 mg daily by mouth. she was treated for a right lower extremity possibly due to venous stasis versus vascular insufficiency versus infection. She received IV vancomycin during her hospital stay. He is known to have rheumatoid arthritis and stopped taking her treatment because of lack  of insurance. during her hospital stay vascular studies were done -- ABIs were 0.88 on the right and 1.1 on the left, she had triphasic flow on both lower extremities and a plain film of the right tibia and fibula showed no signs of osteomyelitis. She is not a diabetic. She is a smoker however and continued to smoke. lab work noted that her sedimentation rate was 29, CRP was less than 0.8, hemoglobin A1c was 5.2 12/02/2016 -- due to a family emergency and social economic reasons she has been unable to keep her appointment with the vascular department for her workup and is expecting insurance coverage after January 1 12/19/2016 -- she continues to smoke. As have new insurance and hopefully will get her vascular test done as soon as possible. 12/26/2016 -- she has quit smoking for the last 3 days and I have commended her. She did receive her  appointment from [facility] and it is later this month 01/13/2017 -- she has not been back to see [place] since January 11 and had her compression wrap on all these days almost 3 weeks. I have sternly cautioned her regarding this. Her appointment for a venous NAIDELYN, PARRELLA. ([id number]) duplex study is next Thursday. 01/23/2017 ---- lower extremity venous duplex reflux evaluation shows small saphenous vein is competent but there is evidence of great saphenous vein reflux in the right lower extremity with no evidence of DVT or SVT. There is also deep vein reflux in the right lower extremity. A vascular consult was recommended. Note the left side was not studied. Lower extremity arterial evaluation was done -- unable to obtain right dorsalis pedis or posterior tibial pressures due to an open wound. Posterior tibial and dorsalis pedis waveform appears strong and triphasic. Right TBI was 0.88. Left ABI was 1.21. 01/30/2017 -- he has an appointment to see the vascular surgeons at [facility] this coming Thursday. her Apligraf has been authorized  by her [employer] and we will get this for her for next week. Objective Constitutional Pulse regular. Respirations normal and unlabored. Afebrile. Vitals Time Taken: 8:09 AM, Height: 63 in, Weight: 214.7 lbs, BMI: 38, Temperature: 98.0 F, Pulse: 75 bpm, Respiratory Rate: 18 breaths/min, Blood Pressure: 144/76 mmHg. Eyes Nonicteric. Reactive to light. Ears, Nose, Mouth, and Throat Lips, teeth, and gums WNL.Marland Kitchen Moist mucosa without lesions. Neck supple and nontender. No palpable supraclavicular or cervical adenopathy. Normal sized without goiter. Respiratory WNL. No retractions.. Breath sounds WNL, No rubs, rales, rhonchi, or wheeze.. Cardiovascular Heart rhythm and rate regular, no murmur or gallop.. Pedal Pulses WNL. No clubbing, cyanosis or edema. Chest Breasts symmetical and no nipple discharge.. Breast tissue WNL, no masses, lumps, or tenderness.. Lymphatic No adneopathy. No adenopathy. No adenopathy. Musculoskeletal Adexa without tenderness or enlargement.. Digits and nails w/o clubbing, cyanosis, infection, petechiae, ischemia, or inflammatory conditions.Marland Kitchen CINDIE, RAJAGOPALAN ([id number]) Psychiatric Judgement and insight Intact.. No evidence of depression, anxiety, or agitation.. General Notes: she has good resolution of her lymphedema and her hyper granulation tissue looks better and no sharp debridement was required today Integumentary (Hair, Skin) No suspicious lesions. No crepitus or fluctuance. No peri-wound warmth or erythema. No masses.. Wound #1 status is Open. Original cause of wound was Gradually Appeared. The wound is located on the Right,Anterior Lower Leg. The wound measures 3cm length x 3cm width x 0.1cm depth; 7.069cm^2 area and 0.707cm^3 volume. There is Fat Layer (Subcutaneous Tissue) Exposed exposed. There is no tunneling or undermining noted. There is a large amount of serous drainage noted. The wound margin is distinct with the outline attached to the  wound base. There is medium (34-66%) red granulation within the wound bed. There is a small (1-33%) amount of necrotic tissue within the wound bed including Adherent Slough. The periwound skin appearance exhibited: Excoriation, Scarring, Ecchymosis, Erythema. The surrounding wound skin color is noted with erythema which is circumferential. Periwound temperature was noted as No Abnormality. The periwound has tenderness on palpation. Assessment Active Problems ICD-10 L97.212 - Non-pressure chronic ulcer of right calf with fat layer exposed I83.012 - Varicose veins of right lower extremity with ulcer of calf F17.218 - Nicotine dependence, cigarettes, with other nicotine-induced disorders M05.60 - Rheumatoid arthritis of unspecified site with involvement of other organs and systems Plan Wound Cleansing: Wound #1 Right,Anterior Lower Leg: Clean wound with Normal Saline. Cleanse wound with mild soap and water May Shower, gently pat wound dry  prior to applying new dressing. - Remove dressing 1 hour before next visit and shower. Anesthetic: Wound #1 Right,Anterior Lower Leg: Topical Lidocaine 4% cream applied to wound bed prior to debridement CRISSI, KONJA. ([id number]) Primary Wound Dressing: Wound #1 Right,Anterior Lower Leg: Hydrafera Blue Secondary Dressing: Wound #1 Right,Anterior Lower Leg: ABD pad Dressing Change Frequency: Wound #1 Right,Anterior Lower Leg: Change dressing every week - Nurse visit as needed. Follow-up Appointments: Wound #1 Right,Anterior Lower Leg: Return Appointment in 1 week. Nurse Visit as needed Edema Control: Wound #1 Right,Anterior Lower Leg: 4-Layer Compression System - Left Lower Extremity Additional Orders / Instructions: Wound #1 Right,Anterior Lower Leg: Stop Smoking Increase protein intake. Medications-please add to medication list.: Wound #1 Right,Anterior Lower Leg: Other: - Vitamin C, Zinc, Vitamin A, MVI General Notes: order apligraph  for pt At this stage the patient has finally had her workup completed both arterial duplex and venous reflux studies and she has varicose veins of the right lower extremity which need a vascular consult. she has an appointment for next week. I have also recommended 1. Hydrofera Blue and a 4-layer compression with Profore. 2. we will get her Apligraf, which has been authorized, next week 3. Commended her on continuing to do well with giving up smoking 4. Asked her to be regular with her was it's to the wound care center Electronic Signature(s) Signed: 01/30/2017 4:08:31 PM By: Evlyn Kanner MD, FACS Previous Signature: 01/30/2017 8:25:01 AM Version By: Evlyn Kanner MD, FACS Entered By: Evlyn Kanner on 01/30/2017 16:08:31 Liles, [name] ([id number]) -------------------------------------------------------------------------------- SuperBill Details Patient Name: [name]. Date of Service: 01/30/2017 Medical Record Number: [id number] Patient Account Number: [id number] Date of Birth/Sex: May 31, 1956 (61 y.o. 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 exposed I83.012 Varicose veins of right lower extremity with ulcer of calf F17.218 Nicotine dependence, cigarettes, with other nicotine-induced disorders M05.60 Rheumatoid arthritis of unspecified site with involvement of other organs and systems Facility Procedures CPT4: Description Modifier Quantity Code [id number] (Facility Use Only) [phone] - APPLY MULTLAY COMPRS LWR RT 1 LEG Physician Procedures CPT4: Description Modifier Quantity Code [id number] - WC PHYS LEVEL 3 - EST PT 1 ICD-10 Description Diagnosis L97.212 Non-pressure chronic ulcer of right  calf with fat layer exposed I83.012 Varicose veins of right lower extremity with ulcer of calf  F17.218 Nicotine dependence, cigarettes, with other nicotine-induced disorders M05.60 Rheumatoid arthritis of unspecified site with involvement of other organs and systems Electronic Signature(s) Signed: 01/30/2017 4:07:08 PM By: Evlyn Kanner MD, FACS Signed: 01/31/2017 4:03:50 PM By: Alejandro Mulling Previous Signature: 01/30/2017 9:17:59 AM Version By: Evlyn Kanner MD, FACS Entered By: Alejandro Mulling on 01/30/2017 10:03:48

## 2017-02-01 NOTE — Progress Notes (Signed)
REMONIA, OTTE ([id number]) Visit Report for 01/30/2017 Arrival Information Details Patient Name: [name], JR. Date of Service: 01/30/2017 8:00 AM Medical Record Number: [id number] Patient Account Number: [id number] Date of Birth/Sex: [birth year] (60 y.o. Female) Treating RN: Carolyne Fiscal, Debi Primary Care Jet Armbrust: Einar Gip Other Clinician: Referring Jazlen Ogarro: Einar Gip Treating Kamora Vossler/Extender: Frann Rider in Treatment: 10 Visit Information History Since Last Visit All ordered tests and consults were completed: No Patient Arrived: Ambulatory Added or deleted any medications: No Arrival Time: 08:05 Any new allergies or adverse reactions: No Accompanied By: self Had a fall or experienced change in No Transfer Assistance: None activities of daily living that may affect Patient Identification Verified: Yes risk of falls: Secondary Verification Process Yes Signs or symptoms of abuse/neglect since last No Completed: visito Patient Requires Transmission- No Hospitalized since last visit: No Based Precautions: Has Dressing in Place as Prescribed: Yes Patient Has Alerts: Yes Has Compression in Place as Prescribed: Yes Patient Alerts: L Leg ABI 1.2 Pain Present Now: No R Leg ABI 0.88 ABIs done in [place] Signature(s) Signed: 01/31/2017 4:03:50 PM By: Alric Quan Entered By: Alric Quan on 01/30/2017 08:08:16 [name], [name] ([id number]) -------------------------------------------------------------------------------- Encounter Discharge Information Details Patient Name: [name]. Date of Service: 01/30/2017 8:00 AM Medical Record Number: [id number] Patient Account Number: [id number] Date of Birth/Sex: 1956-06-24 (60 y.o. Female) Treating RN: Carolyne Fiscal, Debi Primary Care Lionel Woodberry: Einar Gip Other Clinician: Referring Alma Mohiuddin: Einar Gip Treating Angla Delahunt/Extender: Frann Rider in Treatment: 10 Encounter  Discharge Information Items Discharge Pain Level: 0 Discharge Condition: Stable Ambulatory Status: Ambulatory Discharge Destination: Home Transportation: Private Auto Accompanied By: self Schedule Follow-up Appointment: Yes Medication Reconciliation completed and provided to Patient/Care No Baila Rouse: Provided on Clinical Summary of Care: 01/30/2017 Form Type Recipient Paper Patient JP Electronic Signature(s) Signed: 01/30/2017 8:35:20 AM By: Ruthine Dose Entered By: Ruthine Dose on 01/30/2017 08:35:20 [name], [name] ([id number]) -------------------------------------------------------------------------------- Lower Extremity Assessment Details Patient Name: [name]. Date of Service: 01/30/2017 8:00 AM Medical Record Number: [id number] Patient Account Number: [id number] Date of Birth/Sex: 1956-06-27 (60 y.o. Female) Treating RN: Carolyne Fiscal, Debi Primary Care Avelyn Touch: Einar Gip Other Clinician: Referring Jessy Calixte: Einar Gip Treating Rolene Andrades/Extender: Frann Rider in Treatment: 10 Edema Assessment Assessed: [Left: No] [Right: No] E[Left: dema] [Right: :] Calf Left: Right: Point of Measurement: 32 cm From Medial Instep cm 42.5 cm Ankle Left: Right: Point of Measurement: 9 cm From Medial Instep cm 25 cm Vascular Assessment Pulses: Dorsalis Pedis Palpable: [Right:Yes] Posterior Tibial Extremity colors, hair growth, and conditions: Extremity Color: [Right:Hyperpigmented] Temperature of Extremity: [Right:Warm] Capillary Refill: [Right:< 3 seconds] Electronic Signature(s) Signed: 01/31/2017 4:03:50 PM By: Alric Quan Entered By: Alric Quan on 01/30/2017 08:15:34 [name], [name] ([id number]) -------------------------------------------------------------------------------- Multi Wound Chart Details Patient Name: [name]. Date of Service: 01/30/2017 8:00 AM Medical Record Number: [id number] Patient Account Number: [id number] Date  of Birth/Sex: 1955/12/22 (60 y.o. Female) Treating RN: Carolyne Fiscal, Debi Primary Care Schwanda Zima: Einar Gip Other Clinician: Referring Shanie Mauzy: Einar Gip Treating Norely Schlick/Extender: Frann Rider in Treatment: 10 Vital Signs Height(in): 63 Pulse(bpm): 75 Weight(lbs): 214.7 Blood Pressure 144/76 (mmHg): Body Mass Index(BMI): 38 Temperature(F): 98.0 Respiratory Rate 18 (breaths/min): Photos: [1:No Photos] [N/A:N/A] Wound Location: [1:Right Lower Leg - Anterior N/A] Wounding Event: [1:Gradually Appeared] [N/A:N/A] Primary Etiology: [1:Venous Leg Ulcer] [N/A:N/A] Comorbid History: [1:Chronic Obstructive Pulmonary Disease (COPD), Hypertension, Rheumatoid Arthritis] [N/A:N/A] Date Acquired: [1:05/19/2016] [N/A:N/A] Weeks of Treatment: [1:10] [N/A:N/A] Wound Status: [1:Open] [N/A:N/A] Measurements L x W x D 3x3x0.1 [N/A:N/A] (cm) Area (  cm) : [1:7.069] [N/A:N/A] Volume (cm) : [1:0.707] [N/A:N/A] % Reduction in Area: [1:60.50%] [N/A:N/A] % Reduction in Volume: 60.50% [N/A:N/A] Classification: [1:Full Thickness Without Exposed Support Structures] [N/A:N/A] Exudate Amount: [1:Large] [N/A:N/A] Exudate Type: [1:Serous] [N/A:N/A] Exudate Color: [1:amber] [N/A:N/A] Wound Margin: [1:Distinct, outline attached N/A] Granulation Amount: [1:Medium (34-66%)] [N/A:N/A] Granulation Quality: [1:Red, Hyper-granulation] [N/A:N/A] Necrotic Amount: [1:Small (1-33%)] [N/A:N/A] Exposed Structures: [1:Fat Layer (Subcutaneous N/A Tissue) Exposed: Yes Fascia: No] Tendon: No Muscle: No Joint: No Bone: No Epithelialization: Small (1-33%) N/A N/A Periwound Skin Texture: Excoriation: Yes N/A N/A Scarring: Yes Periwound Skin No Abnormalities Noted N/A N/A Moisture: Periwound Skin Color: Ecchymosis: Yes N/A N/A Erythema: Yes Erythema Location: Circumferential N/A N/A Temperature: No Abnormality N/A N/A Tenderness on Yes N/A N/A Palpation: Wound Preparation: Ulcer Cleansing: N/A  N/A Rinsed/Irrigated with Saline, Other: soap and water Topical Anesthetic Applied: Other: lidocaine 4% Treatment Notes Electronic Signature(s) Signed: 01/30/2017 8:22:08 AM By: Christin Fudge MD, FACS Entered By: Christin Fudge on 01/30/2017 08:22:08 [name] ([id number]) -------------------------------------------------------------------------------- [place] Details Patient Name: [name], [name]. Date of Service: 01/30/2017 8:00 AM Medical Record Number: [id number] Patient Account Number: [id number] Date of Birth/Sex: [birth year] (60 y.o. Female) Treating RN: Carolyne Fiscal, Debi Primary Care Beatrix Breece: Einar Gip Other Clinician: Referring Rasheedah Reis: Einar Gip Treating Arisbel Maione/Extender: Frann Rider in Treatment: 10 Active Inactive ` Abuse / Safety / Falls / Self Care Management Nursing Diagnoses: Potential for falls Goals: Patient will remain injury free Date Initiated: 11/18/2016 Target Resolution Date: 01/20/2017 Goal Status: Active Interventions: Assess fall risk on admission and as needed Assess self care needs on admission and as needed Notes: ` Nutrition Nursing Diagnoses: Imbalanced nutrition Goals: Patient/caregiver agrees to and verbalizes understanding of need to use nutritional supplements and/or vitamins as prescribed Date Initiated: 11/18/2016 Target Resolution Date: 01/20/2017 Goal Status: Active Interventions: Assess patient nutrition upon admission and as needed per policy Notes: ` Orientation to the Wound Care Program Nursing Diagnoses: DERISHA, FUNDERBURKE ([id number]) Knowledge deficit related to the wound healing center program Goals: Patient/caregiver will verbalize understanding of the [place] Date Initiated: 11/18/2016 Target Resolution Date: 01/20/2017 Goal Status: Active Interventions: Provide education on orientation to the wound center Notes: ` Pain, Acute or Chronic Nursing  Diagnoses: Pain, acute or chronic: actual or potential Potential alteration in comfort, pain Goals: Patient will verbalize adequate pain control and receive pain control interventions during procedures as needed Date Initiated: 11/18/2016 Target Resolution Date: 01/20/2017 Goal Status: Active Patient/caregiver will verbalize adequate pain control between visits Date Initiated: 11/18/2016 Target Resolution Date: 01/20/2017 Goal Status: Active Patient/caregiver will verbalize comfort level met Date Initiated: 11/18/2016 Target Resolution Date: 01/20/2017 Goal Status: Active Interventions: Assess comfort goal upon admission Complete pain assessment as per visit requirements Notes: ` Soft Tissue Infection Nursing Diagnoses: Impaired tissue integrity Knowledge deficit related to disease process and management Knowledge deficit related to home infection control: handwashing, handling of soiled dressings, supply storage Goals: SARENITY, RAMAKER ([id number]) Patient/caregiver will verbalize understanding of or measures to prevent infection and contamination in the home setting Date Initiated: 11/18/2016 Target Resolution Date: 01/20/2017 Goal Status: Active Patient's soft tissue infection will resolve Date Initiated: 11/18/2016 Target Resolution Date: 01/20/2017 Goal Status: Active Interventions: Assess signs and symptoms of infection every visit Provide education on infection Treatment Activities: Education provided on Infection : 12/26/2016 Notes: ` Wound/Skin Impairment Nursing Diagnoses: Impaired tissue integrity Goals: Ulcer/skin breakdown will have a volume reduction of 30% by week 4 Date Initiated: 11/18/2016 Target Resolution Date: 01/20/2017 Goal Status:  Active Ulcer/skin breakdown will have a volume reduction of 50% by week 8 Date Initiated: 11/18/2016 Target Resolution Date: 01/20/2017 Goal Status: Active Ulcer/skin breakdown will have a volume reduction of 80% by week 12 Date  Initiated: 11/18/2016 Target Resolution Date: 01/20/2017 Goal Status: Active Interventions: Assess patient/caregiver ability to perform ulcer/skin care regimen upon admission and as needed Assess ulceration(s) every visit Provide education on smoking Notes: Electronic Signature(s) Signed: 01/31/2017 4:03:50 PM By: Alric Quan Entered By: Alric Quan on 01/30/2017 08:15:37 [name], [name] ([id number]) -------------------------------------------------------------------------------- Pain Assessment Details Patient Name: [name]. Date of Service: 01/30/2017 8:00 AM Medical Record Number: [id number] Patient Account Number: [id number] Date of Birth/Sex: 1956/10/27 (60 y.o. Female) Treating RN: Carolyne Fiscal, Debi Primary Care Jhoselin Crume: Einar Gip Other Clinician: Referring Kallon Caylor: Einar Gip Treating Nakul Avino/Extender: Frann Rider in Treatment: 10 Active Problems Location of Pain Severity and Description of Pain Patient Has Paino No Site Locations With Dressing Change: No Pain Management and Medication Current Pain Management: Electronic Signature(s) Signed: 01/31/2017 4:03:50 PM By: Alric Quan Entered By: Alric Quan on 01/30/2017 08:08:22 [name], [name] ([id number]) -------------------------------------------------------------------------------- Patient/Caregiver Education Details Patient Name: [name]. Date of Service: 01/30/2017 8:00 AM Medical Record Number: [id number] Patient Account Number: [id number] Date of Birth/Gender: 28-Oct-1956 (60 y.o. Female) Treating RN: Carolyne Fiscal, Debi Primary Care Physician: Einar Gip Other Clinician: Referring Physician: Einar Gip Treating Physician/Extender: Frann Rider in Treatment: 10 Education Assessment Education Provided To: Patient Education Topics Provided Wound/Skin Impairment: Handouts: Other: do not get wrap wet Methods: Demonstration, Explain/Verbal Responses:  State content correctly Electronic Signature(s) Signed: 01/31/2017 4:03:50 PM By: Alric Quan Entered By: Alric Quan on 01/30/2017 08:33:58 [name], [name] ([id number]) -------------------------------------------------------------------------------- Wound Assessment Details Patient Name: [name]. Date of Service: 01/30/2017 8:00 AM Medical Record Number: [id number] Patient Account Number: [id number] Date of Birth/Sex: 09-18-1956 (60 y.o. Female) Treating RN: Carolyne Fiscal, Debi Primary Care Kaysin Brock: Einar Gip Other Clinician: Referring Isael Stille: Einar Gip Treating Shepherd Finnan/Extender: Frann Rider in Treatment: 10 Wound Status Wound Number: 1 Primary Venous Leg Ulcer Etiology: Wound Location: Right Lower Leg - Anterior Wound Open Wounding Event: Gradually Appeared Status: Date Acquired: 05/19/2016 Comorbid Chronic Obstructive Pulmonary Weeks Of Treatment: 10 History: Disease (COPD), Hypertension, Clustered Wound: No Rheumatoid Arthritis Photos Photo Uploaded By: Alric Quan on 01/30/2017 10:42:58 Wound Measurements Length: (cm) 3 Width: (cm) 3 Depth: (cm) 0.1 Area: (cm) 7.069 Volume: (cm) 0.707 % Reduction in Area: 60.5% % Reduction in Volume: 60.5% Epithelialization: Small (1-33%) Tunneling: No Undermining: No Wound Description Full Thickness Without Exposed Classification: Support Structures Wound Margin: Distinct, outline attached Exudate Large Amount: Exudate Type: Serous Exudate Color: amber Foul Odor After Cleansing: No Wound Bed Granulation Amount: Medium (34-66%) Exposed Structure Granulation Quality: Red, Hyper-granulation Fascia Exposed: No Guereca, [name] ([id number]) Necrotic Amount: Small (1-33%) Fat Layer (Subcutaneous Tissue) Exposed: Yes Necrotic Quality: Adherent Slough Tendon Exposed: No Muscle Exposed: No Joint Exposed: No Bone Exposed: No Periwound Skin Texture Texture Color No Abnormalities  Noted: No No Abnormalities Noted: No Excoriation: Yes Ecchymosis: Yes Scarring: Yes Erythema: Yes Erythema Location: Circumferential Moisture No Abnormalities Noted: No Temperature / Pain Temperature: No Abnormality Tenderness on Palpation: Yes Wound Preparation Ulcer Cleansing: Rinsed/Irrigated with Saline, Other: soap and water, Topical Anesthetic Applied: Other: lidocaine 4%, Treatment Notes Wound #1 (Right, Anterior Lower Leg) 1. Cleansed with: Clean wound with Normal Saline Cleanse wound with antibacterial soap and water 2. Anesthetic Topical Lidocaine 4% cream to wound bed prior to debridement 4. Dressing Applied: Hydrafera Blue 5. Secondary  Dressing Applied ABD Pad 7. Secured with Tape 4-Layer Compression System - Right Lower Extremity Notes white dome paste anchor Electronic Signature(s) Signed: 01/31/2017 4:03:50 PM By: Alric Quan Entered By: Alric Quan on 01/30/2017 08:15:09 [name], [name] ([id number]) -------------------------------------------------------------------------------- Vitals Details Patient Name: [name]. Date of Service: 01/30/2017 8:00 AM Medical Record Number: [id number] Patient Account Number: [id number] Date of Birth/Sex: April 02, 1956 (60 y.o. Female) Treating RN: Carolyne Fiscal, Debi Primary Care Shonya Sumida: Einar Gip Other Clinician: Referring Lynell Kussman: Einar Gip Treating Makhya Arave/Extender: Frann Rider in Treatment: 10 Vital Signs Time Taken: 08:09 Temperature (F): 98.0 Height (in): 63 Pulse (bpm): 75 Weight (lbs): 214.7 Respiratory Rate (breaths/min): 18 Body Mass Index (BMI): 38 Blood Pressure (mmHg): 144/76 Reference Range: 80 - 120 mg / dl Electronic Signature(s) Signed: 01/31/2017 4:03:50 PM By: Alric Quan Entered By: Alric Quan on 01/30/2017 08:09:56

## 2017-02-03 ENCOUNTER — Encounter: Payer: BLUE CROSS/BLUE SHIELD | Admitting: Internal Medicine

## 2017-02-06 ENCOUNTER — Ambulatory Visit (INDEPENDENT_AMBULATORY_CARE_PROVIDER_SITE_OTHER): Payer: BLUE CROSS/BLUE SHIELD | Admitting: Vascular Surgery

## 2017-02-06 ENCOUNTER — Encounter: Payer: BLUE CROSS/BLUE SHIELD | Admitting: Surgery

## 2017-02-06 VITALS — BP 129/87 | HR 80 | Temp 98.3°F | Resp 18 | Ht 62.0 in | Wt 226.0 lb

## 2017-02-06 DIAGNOSIS — L97212 Non-pressure chronic ulcer of right calf with fat layer exposed: Secondary | ICD-10-CM | POA: Diagnosis not present

## 2017-02-06 DIAGNOSIS — I83893 Varicose veins of bilateral lower extremities with other complications: Secondary | ICD-10-CM | POA: Diagnosis not present

## 2017-02-06 NOTE — Progress Notes (Signed)
[facility] [place]  Patient name: [name] MRN: [id number] DOB: 15-Nov-1956 Sex: female  REASON FOR CONSULT: Evaluation of lower extremity venous hypertension and venous ulcer right leg.  HPI: [name] is a 61 y.o. female, who is here today for evaluation of severe venous hypertension. She does have changes of venous hypertension bilaterally more so on the right than on the left. She's had a severe pretibial open venous ulcer is being treated at the wound center. This is been slow to heal is been present for greater than 1-2 months. She does have severe rheumatoid arthritis as well. She has had a long history of significant extensive venous varicosities throughout her right thigh and calf. Less so on her left leg. She does have a history of severe venous disease in her mother and maternal grandmother. She had not had venous ulcers up to this point. She is being appropriately treated with topical ointment and elevation and compression  Past Medical History:  Diagnosis Date  . Hashimoto's thyroiditis   . HTN (hypertension)   . Hypercholesteremia   . Hypothyroid   . Rheumatoid arthritis(714.0)     Family History  Problem Relation Age of Onset  . Diabetes Mother   . Lung cancer Father     SOCIAL HISTORY: Social History   Social History  . Marital status: Legally Separated    Spouse name: N/A  . Number of children: N/A  . Years of education: N/A   Occupational History  . Not on file.   Social History Main Topics  . Smoking status: Former Smoker    Packs/day: 0.50    Years: 40.00    Types: Cigarettes    Quit date: 02/23/2014  . Smokeless tobacco: Never Used     Comment: quit x 3 days.  . Alcohol use No  . Drug use: No  . Sexual activity: No   Other Topics Concern  . Not on file   Social History Narrative  . No narrative on file    Allergies  Allergen Reactions  . Latex Hives and Itching   blisters  . Oxycodone-Acetaminophen Nausea And Vomiting  . Adhesive [Tape] Rash    Patient prefers paper tape  . Penicillins Swelling and Rash    Has patient had a PCN reaction causing immediate rash, facial/tongue/throat swelling, SOB or lightheadedness with hypotension: Yes Has patient had a PCN reaction causing severe rash involving mucus membranes or skin necrosis: No Has patient had a PCN reaction that required hospitalization No Has patient had a PCN reaction occurring within the last 10 years: No If all of the above answers are "NO", then may proceed with Cephalosporin use.   . Strawberry Extract Itching and Rash    Current Outpatient Prescriptions  Medication Sig Dispense Refill  . diclofenac sodium (VOLTAREN) 1 % GEL Apply 2 g topically 4 (four) times daily. 1 Tube 2  . levothyroxine (SYNTHROID, LEVOTHROID) 150 MCG tablet Take 1 tablet (150 mcg total) by mouth daily before breakfast. 30 tablet 2  . methotrexate (RHEUMATREX) 2.5 MG tablet Take 3 tablets (7.5 mg total) by mouth every 7 (seven) days. NO CHILD-PROOF BOTTLE CAPS PLEASE 12 tablet 1  . methotrexate (RHEUMATREX) 2.5 MG tablet take 3 tablets by mouth EVERY 7 DATS 12 tablet 0  . pantoprazole (PROTONIX) 40 MG tablet take 2 tablet by mouth once daily 180 tablet 2  . simvastatin (ZOCOR) 20 MG tablet Take 1 tablet (20 mg total) by mouth daily  at 6 PM. 30 tablet 2  . HYDROcodone-acetaminophen (NORCO/VICODIN) 5-325 MG tablet take 1 tablet by mouth every 12 hours if needed for severe pain  0  . Hydrocodone-Acetaminophen 5-300 MG TABS take 1 tablet by mouth three times a day with food if needed  0   No current facility-administered medications for this visit.     REVIEW OF SYSTEMS:  [X]  denotes positive finding, [ ]  denotes negative finding Cardiac  Comments:  Chest pain or chest pressure:    Shortness of breath upon exertion:    Short of breath when lying flat:    Irregular heart rhythm:        Vascular    Pain in calf,  thigh, or hip brought on by ambulation: x   Pain in feet at night that wakes you up from your sleep:     Blood clot in your veins:    Leg swelling:  x       Pulmonary    Oxygen at home:    Productive cough:     Wheezing:         Neurologic    Sudden weakness in arms or legs:     Sudden numbness in arms or legs:     Sudden onset of difficulty speaking or slurred speech:    Temporary loss of vision in one eye:     Problems with dizziness:         Gastrointestinal    Blood in stool:     Vomited blood:         Genitourinary    Burning when urinating:     Blood in urine:        Psychiatric    Major depression:         Hematologic    Bleeding problems:    Problems with blood clotting too easily:        Skin    Rashes or ulcers: x       Constitutional    Fever or chills:      PHYSICAL EXAM: Vitals:   02/06/17 0913  BP: 129/87  Pulse: 80  Resp: 18  Temp: 98.3 F (36.8 C)  SpO2: 99%  Weight: 226 lb (102.5 kg)  Height: 5\' 2"  (1.575 m)    GENERAL: The patient is a well-nourished female, in no acute distress. The vital signs are documented above. CARDIOVASCULAR: 2+ radial and 2+ dorsalis pedis pulses bilaterally PULMONARY: There is good air exchange  ABDOMEN: Soft and non-tender  MUSCULOSKELETAL: There are no major deformities or cyanosis. NEUROLOGIC: No focal weakness or paresthesias are detected. SKIN: Changes of venous hypertension with hemosiderin deposits bilaterally. Open ulcer over the distal pretibial area on the right PSYCHIATRIC: The patient has a normal affect.  DATA:  Duplex from 01/16/2017 was reviewed with the patient. This shows extensive reflux throughout an enlarged great saphenous vein in her thigh with the flow into these very large tributary varicosities throughout her thigh and calf  MEDICAL ISSUES: Discussed options with patient. Severe CEAP class VI venous stasis disease. Have recommended laser ablation of her right great saphenous vein and  stab phlebectomy of multiple tributary varicosities to reduce her venous hypertension and improve her wound healing and reduce her risk for recurrent ulceration. She has warn compression in the past is quite difficult to her due to her severe rheumatoid arthritis inability to place the compression on. She would be treated with Ace wrap following the ablation of her great saphenous vein to her  thigh. She wished to proceed as soon as possible   Larina Earthly, MD FACS [facility] Tel [phone] Pager [phone]

## 2017-02-07 NOTE — Progress Notes (Addendum)
[name], [name] ([id number]) Visit Report for 02/06/2017 Chief Complaint Document Details Patient Name: SNOW, PEOPLES. Date of Service: 02/06/2017 1:30 PM Medical Record Number: [id number] Patient Account Number: [id number] Date of Birth/Sex: 1956-08-30 (61 y.o. Female) Treating RN: Ashok Cordia, Debi Primary Care Provider: Noemi Chapel Other Clinician: Referring Provider: Noemi Chapel Treating Provider/Extender: Rudene Re in Treatment: 11 Information Obtained from: Patient Chief Complaint Patient presents for treatment of an open ulcer due to venous insufficiency to her right lower extremity which she's had for over 6 months Electronic Signature(s) Signed: 02/06/2017 2:31:49 PM By: Evlyn Kanner MD, FACS Entered By: Evlyn Kanner on 02/06/2017 14:31:48 Haslam, [name] ([id number]) -------------------------------------------------------------------------------- Cellular or Tissue Based Product Details Patient Name: [name]. Date of Service: 02/06/2017 1:30 PM Medical Record Number: [id number] Patient Account Number: [id number] Date of Birth/Sex: [birth year] (61 y.o. Female) Treating RN: Ashok Cordia, Debi Primary Care Provider: Noemi Chapel Other Clinician: Referring Provider: Noemi Chapel Treating Provider/Extender: Rudene Re in Treatment: 11 Cellular or Tissue Based Wound #1 Right,Anterior Lower Leg Product Type Applied to: Performed By: Physician Evlyn Kanner, MD Cellular or Tissue Based Apligraf Product Type: Pre-procedure Yes - 14:02 Verification/Time Out Taken: Location: trunk / arms / legs Wound Size (sq cm): 6.5 Product Size (sq cm): 44 Waste Size (sq cm): 37.5 Waste Reason: wouns size Amount of Product Applied (sq cm): 6.5 Lot #: [id number] Expiration Date: 02/13/2017 Fenestrated: Yes Instrument: Blade Reconstituted: Yes Solution Type: NORMAL SALINE Solution Amount: Lot #: [id number] Solution Expiration  07/16/2018 Date: Secured: Yes Secured With: Steri-Strips Dressing Applied: Yes Primary Dressing: MEPITEL Procedural Pain: 0 Post Procedural Pain: 0 Response to Treatment: Procedure was tolerated well Post Procedure Diagnosis Same as Pre-procedure Electronic Signature(s) Signed: 02/06/2017 2:31:42 PM By: Evlyn Kanner MD, FACS [place], [name] ([id number]) Entered By: Evlyn Kanner on 02/06/2017 14:31:42 [name], [name] ([id number]) -------------------------------------------------------------------------------- HPI Details Patient Name: [name]. Date of Service: 02/06/2017 1:30 PM Medical Record Number: [id number] Patient Account Number: [id number] Date of Birth/Sex: 1956/11/04 (61 y.o. Female) Treating RN: Ashok Cordia, Debi Primary Care Provider: Noemi Chapel Other Clinician: Referring Provider: Noemi Chapel Treating Provider/Extender: Rudene Re in Treatment: 11 History of Present Illness Location: ulcerated area on the right lower extremity Quality: Patient reports experiencing a sharp pain to affected area(s). Severity: Patient states wound are getting worse. Duration: Patient has had the wound for > 6 months prior to seeking treatment at the wound center Timing: Pain in wound is constant (hurts all the time) Context: The wound appeared gradually over time Modifying Factors: Other treatment(s) tried include:recent admission to hospital for a cellulitis and was treated with antibiotics and an Unna boot Associated Signs and Symptoms: Patient reports having increase swelling. HPI Description: 61 year old patient who was recently admitted to the hospital between 11/14/2016 and 11/15/2016 for right lower extremity ulcer with cellulitis. she was asked to stop clindamycin and doxycycline and her prednisone and take Levaquin 500 mg daily by mouth. she was treated for a right lower extremity possibly due to venous stasis versus vascular insufficiency versus  infection. She received IV vancomycin during her hospital stay. He is known to have rheumatoid arthritis and stopped taking her treatment because of lack of insurance. during her hospital stay vascular studies were done -- ABIs were 0.88 on the right and 1.1 on the left, she had triphasic flow on both lower extremities and a plain film of the right tibia and fibula showed no signs of osteomyelitis. She is not a diabetic. She is a  smoker however and continued to smoke. lab work noted that her sedimentation rate was 29, CRP was less than 0.8, hemoglobin A1c was 5.2 12/02/2016 -- due to a family emergency and social economic reasons she has been unable to keep her appointment with the vascular department for her workup and is expecting insurance coverage after January 1 12/19/2016 -- she continues to smoke. As have new insurance and hopefully will get her vascular test done as soon as possible. 12/26/2016 -- she has quit smoking for the last 3 days and I have commended her. She did receive her appointment from vein and vascular [place] and it is later this month 01/13/2017 -- she has not been back to see [place] since January 11 and had her compression wrap on all these days almost 3 weeks. I have sternly cautioned her regarding this. Her appointment for a venous duplex study is next Thursday. 01/23/2017 ---- lower extremity venous duplex reflux evaluation shows small saphenous vein is competent but there is evidence of great saphenous vein reflux in the right lower extremity with no evidence of DVT or SVT. There is also deep vein reflux in the right lower extremity. A vascular consult was recommended. Note the left side was not studied. Lower extremity arterial evaluation was done -- unable to obtain right dorsalis pedis or posterior tibial pressures due to an open wound. Posterior tibial and dorsalis pedis waveform appears strong and triphasic. Right TBI was 0.88. Left ABI was 1.21. [name], [name] ([id number]) 01/30/2017 -- he has an appointment to see the vascular surgeons at [facility] this coming Thursday. her Apligraf has been authorized by her [employer] and we will get this for her for next week. 2/22 2018 -- she was seen by Dr. Tawanna Cooler Early this morning -- she was found to have severe CEAP class VI venous stasis disease and had recommended laser ablation of her right great saphenous vein and stab phlebectomy's of multiple tributary varicosities to recent venous hypertension and improve her wound healing and reduce the risk of recurrent ulceration. She has had her first application of Apligraf today. Electronic Signature(s) Signed: 02/06/2017 2:33:10 PM By: Evlyn Kanner MD, FACS Previous Signature: 02/06/2017 2:32:01 PM Version By: Evlyn Kanner MD, FACS Previous Signature: 02/06/2017 1:54:58 PM Version By: Evlyn Kanner MD, FACS Previous Signature: 02/06/2017 1:54:43 PM Version By: Evlyn Kanner MD, FACS Entered By: Evlyn Kanner on 02/06/2017 14:33:10 [name], [name] ([id number]) -------------------------------------------------------------------------------- Physical Exam Details Patient Name: [name], [name]. Date of Service: 02/06/2017 1:30 PM Medical Record Number: [id number] Patient Account Number: [id number] Date of Birth/Sex: [birth year] (61 y.o. Female) Treating RN: Ashok Cordia, Debi Primary Care Provider: Noemi Chapel Other Clinician: Referring Provider: Noemi Chapel Treating Provider/Extender: Rudene Re in Treatment: 11 Constitutional . Pulse regular. Respirations normal and unlabored. Afebrile. . Eyes Nonicteric. Reactive to light. Ears, Nose, Mouth, and Throat Lips, teeth, and gums WNL.Marland Kitchen Moist mucosa without lesions. Neck supple and nontender. No palpable supraclavicular or cervical adenopathy. Normal sized without goiter. Respiratory WNL. No retractions.. Breath sounds WNL, No rubs, rales, rhonchi, or wheeze.. Cardiovascular Heart  rhythm and rate regular, no murmur or gallop.. Pedal Pulses WNL. No clubbing, cyanosis or edema. Chest Breasts symmetical and no nipple discharge.. Breast tissue WNL, no masses, lumps, or tenderness.. Lymphatic No adneopathy. No adenopathy. No adenopathy. Musculoskeletal Adexa without tenderness or enlargement.. Digits and nails w/o clubbing, cyanosis, infection, petechiae, ischemia, or inflammatory conditions.. Integumentary (Hair, Skin) No suspicious lesions. No crepitus or fluctuance. No peri-wound warmth or erythema. No masses.Marland Kitchen  Psychiatric Judgement and insight Intact.. No evidence of depression, anxiety, or agitation.. Notes the wound base is clean and she has had her first application of Apligraf today which has been bolstered in place and a 4-layer compression will be used. Electronic Signature(s) Signed: 02/06/2017 2:32:32 PM By: Evlyn Kanner MD, FACS Entered By: Evlyn Kanner on 02/06/2017 14:32:32 [name], [name] ([id number]) -------------------------------------------------------------------------------- Physician Orders Details Patient Name: [name], [name]. Date of Service: 02/06/2017 1:30 PM Medical Record Number: [id number] Patient Account Number: [id number] Date of Birth/Sex: 02-13-1956 (61 y.o. Female) Treating RN: Ashok Cordia, Debi Primary Care Provider: Noemi Chapel Other Clinician: Referring Provider: Noemi Chapel Treating Provider/Extender: Rudene Re in Treatment: 11 Verbal / Phone Orders: Yes Clinician: Ashok Cordia, Debi Read Back and Verified: Yes Diagnosis Coding Wound Cleansing Wound #1 Right,Anterior Lower Leg o Clean wound with Normal Saline. o Cleanse wound with mild soap and water Anesthetic Wound #1 Right,Anterior Lower Leg o Topical Lidocaine 4% cream applied to wound bed prior to debridement Skin Barriers/Peri-Wound Care Wound #1 Right,Anterior Lower Leg o Skin Prep Primary Wound Dressing Wound #1 Right,Anterior Lower  Leg o Other: - APLIGRAPH Secondary Dressing Wound #1 Right,Anterior Lower Leg o ABD pad o Dry Gauze o Other - CHARCOAL Dressing Change Frequency Wound #1 Right,Anterior Lower Leg o Change dressing every week - Nurse visit as needed. Follow-up Appointments Wound #1 Right,Anterior Lower Leg o Return Appointment in 1 week. o Nurse Visit as needed Edema Control Wound #1 Right,Anterior Lower Leg o 4-Layer Compression System - Left Lower Extremity - UNNA TO ANCHOR [name], [name] ([id number]) Additional Orders / Instructions Wound #1 Right,Anterior Lower Leg o Stop Smoking o Increase protein intake. Medications-please add to medication list. Wound #1 Right,Anterior Lower Leg o Other: - Vitamin C, Zinc, Vitamin A, MVI Electronic Signature(s) Signed: 02/06/2017 4:29:50 PM By: Evlyn Kanner MD, FACS Signed: 02/07/2017 4:53:33 PM By: Alejandro Mulling Entered By: Alejandro Mulling on 02/06/2017 14:10:29 [place], [name] ([id number]) -------------------------------------------------------------------------------- Problem List Details Patient Name: GELENA, BRYAND. Date of Service: 02/06/2017 1:30 PM Medical Record Number: [id number] Patient Account Number: [id number] Date of Birth/Sex: 1956/08/03 (61 y.o. 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/18/2016 Yes exposed I83.012 Varicose veins of right lower extremity with ulcer of calf 11/18/2016 Yes F17.218 Nicotine dependence, cigarettes, with other nicotine- 11/18/2016 Yes induced disorders M05.60 Rheumatoid arthritis of unspecified site with involvement 11/18/2016 Yes of other organs and systems Inactive Problems Resolved  Problems Electronic Signature(s) Signed: 02/06/2017 2:31:06 PM By: Evlyn Kanner MD, FACS Entered By: Evlyn Kanner on 02/06/2017 14:31:05 [name], [name] ([id number]) -------------------------------------------------------------------------------- Progress Note Details Patient Name: [name]. Date of Service: 02/06/2017 1:30 PM Medical Record Number: [id number] Patient Account Number: [id number] Date of Birth/Sex: 1956-03-20 (61 y.o. Female) Treating RN: Ashok Cordia, Debi Primary Care Provider: Noemi Chapel Other Clinician: Referring Provider: Noemi Chapel Treating Provider/Extender: Rudene Re in Treatment: 11 Subjective Chief Complaint Information obtained from Patient Patient presents for treatment of an open ulcer due to venous insufficiency to her right lower extremity which she's had for over 6 months History of Present Illness (HPI) The following HPI elements were documented for the patient's wound: Location: ulcerated area on the right lower extremity Quality: Patient reports experiencing a sharp pain to affected area(s). Severity: Patient states wound are getting worse. Duration:  Patient has had the wound for > 6 months prior to seeking treatment at the wound center Timing: Pain in wound is constant (hurts all the time) Context: The wound appeared gradually over time Modifying Factors: Other treatment(s) tried include:recent admission to hospital for a cellulitis and was treated with antibiotics and an Unna boot Associated Signs and Symptoms: Patient reports having increase swelling. 61 year old patient who was recently admitted to the hospital between 11/14/2016 and 11/15/2016 for right lower extremity ulcer with cellulitis. she was asked to stop clindamycin and doxycycline and her prednisone and take Levaquin 500 mg daily by mouth. she was treated for a right lower extremity possibly due to venous stasis versus vascular insufficiency versus infection.  She received IV vancomycin during her hospital stay. He is known to have rheumatoid arthritis and stopped taking her treatment because of lack of insurance. during her hospital stay vascular studies were done -- ABIs were 0.88 on the right and 1.1 on the left, she had triphasic flow on both lower extremities and a plain film of the right tibia and fibula showed no signs of osteomyelitis. She is not a diabetic. She is a smoker however and continued to smoke. lab work noted that her sedimentation rate was 29, CRP was less than 0.8, hemoglobin A1c was 5.2 12/02/2016 -- due to a family emergency and social economic reasons she has been unable to keep her appointment with the vascular department for her workup and is expecting insurance coverage after January 1 12/19/2016 -- she continues to smoke. As have new insurance and hopefully will get her vascular test done as soon as possible. 12/26/2016 -- she has quit smoking for the last 3 days and I have commended her. She did receive her appointment from vein and vascular [place] and it is later this month 01/13/2017 -- she has not been back to see [place] since January 11 and had her compression wrap on all these days almost 3 weeks. I have sternly cautioned her regarding this. Her appointment for a venous [name], [name]. ([id number]) duplex study is next Thursday. 01/23/2017 ---- lower extremity venous duplex reflux evaluation shows small saphenous vein is competent but there is evidence of great saphenous vein reflux in the right lower extremity with no evidence of DVT or SVT. There is also deep vein reflux in the right lower extremity. A vascular consult was recommended. Note the left side was not studied. Lower extremity arterial evaluation was done -- unable to obtain right dorsalis pedis or posterior tibial pressures due to an open wound. Posterior tibial and dorsalis pedis waveform appears strong and triphasic. Right TBI was 0.88. Left ABI was  1.21. 01/30/2017 -- he has an appointment to see the vascular surgeons at [facility] this coming Thursday. her Apligraf has been authorized by her [employer] and we will get this for her for next week. 2/22 2018 -- she was seen by Dr. Tawanna Cooler Early this morning -- she was found to have severe CEAP class VI venous stasis disease and had recommended laser ablation of her right great saphenous vein and stab phlebectomy's of multiple tributary varicosities to recent venous hypertension and improve her wound healing and reduce the risk of recurrent ulceration. She has had her first application of Apligraf today. Objective Constitutional Pulse regular. Respirations normal and unlabored. Afebrile. Vitals Time Taken: 1:44 PM, Height: 63 in, Weight: 214.7 lbs, BMI: 38, Temperature: 97.7 F, Pulse: 80 bpm, Respiratory Rate: 18 breaths/min, Blood Pressure: 127/79 mmHg. Eyes Nonicteric. Reactive to light. Ears,  Nose, Mouth, and Throat Lips, teeth, and gums WNL.Marland Kitchen Moist mucosa without lesions. Neck supple and nontender. No palpable supraclavicular or cervical adenopathy. Normal sized without goiter. Respiratory WNL. No retractions.. Breath sounds WNL, No rubs, rales, rhonchi, or wheeze.. Cardiovascular Heart rhythm and rate regular, no murmur or gallop.. Pedal Pulses WNL. No clubbing, cyanosis or edema. Chest Breasts symmetical and no nipple discharge.. Breast tissue WNL, no masses, lumps, or tenderness.Marland Kitchen [name], [name] ([id number]) Lymphatic No adneopathy. No adenopathy. No adenopathy. Musculoskeletal Adexa without tenderness or enlargement.. Digits and nails w/o clubbing, cyanosis, infection, petechiae, ischemia, or inflammatory conditions.Marland Kitchen Psychiatric Judgement and insight Intact.. No evidence of depression, anxiety, or agitation.. General Notes: the wound base is clean and she has had her first application of Apligraf today which has been bolstered in place and a 4-layer compression  will be used. Integumentary (Hair, Skin) No suspicious lesions. No crepitus or fluctuance. No peri-wound warmth or erythema. No masses.. Wound #1 status is Open. Original cause of wound was Gradually Appeared. The wound is located on the Right,Anterior Lower Leg. The wound measures 2.5cm length x 2.6cm width x 0.1cm depth; 5.105cm^2 area and 0.511cm^3 volume. There is Fat Layer (Subcutaneous Tissue) Exposed exposed. There is no tunneling or undermining noted. There is a large amount of serosanguineous drainage noted. The wound margin is distinct with the outline attached to the wound base. There is medium (34-66%) red granulation within the wound bed. There is a small (1-33%) amount of necrotic tissue within the wound bed including Adherent Slough. The periwound skin appearance exhibited: Excoriation, Scarring, Ecchymosis, Erythema. The surrounding wound skin color is noted with erythema which is circumferential. Periwound temperature was noted as No Abnormality. The periwound has tenderness on palpation. Assessment Active Problems ICD-10 L97.212 - Non-pressure chronic ulcer of right calf with fat layer exposed I83.012 - Varicose veins of right lower extremity with ulcer of calf F17.218 - Nicotine dependence, cigarettes, with other nicotine-induced disorders M05.60 - Rheumatoid arthritis of unspecified site with involvement of other organs and systems Procedures Wound #1 Wound #1 is a Venous Leg Ulcer located on the Right,Anterior Lower Leg. A skin graft procedure using a bioengineered skin substitute/cellular or tissue based product was performed by Evlyn Kanner, MD. [name], [name] ([id number]) was applied and secured with Steri-Strips. 6.5 sq cm of product was utilized and 37.5 sq cm was wasted due to wouns size. Post Application, MEPITEL was applied. A Time Out was conducted at 14:02, prior to the start of the procedure. The procedure was tolerated well with a pain level of 0  throughout and a pain level of 0 following the procedure. Post procedure Diagnosis Wound #1: Same as Pre-Procedure . Plan Wound Cleansing: Wound #1 Right,Anterior Lower Leg: Clean wound with Normal Saline. Cleanse wound with mild soap and water Anesthetic: Wound #1 Right,Anterior Lower Leg: Topical Lidocaine 4% cream applied to wound bed prior to debridement Skin Barriers/Peri-Wound Care: Wound #1 Right,Anterior Lower Leg: Skin Prep Primary Wound Dressing: Wound #1 Right,Anterior Lower Leg: Other: - APLIGRAPH Secondary Dressing: Wound #1 Right,Anterior Lower Leg: ABD pad Dry Gauze Other - CHARCOAL Dressing Change Frequency: Wound #1 Right,Anterior Lower Leg: Change dressing every week - Nurse visit as needed. Follow-up Appointments: Wound #1 Right,Anterior Lower Leg: Return Appointment in 1 week. Nurse Visit as needed Edema Control: Wound #1 Right,Anterior Lower Leg: 4-Layer Compression System - Left Lower Extremity - UNNA TO ANCHOR Additional Orders / Instructions: Wound #1 Right,Anterior Lower Leg: Stop Smoking Increase protein intake. Medications-please add to  medication list.: Wound #1 Right,Anterior Lower Leg: Other: - Vitamin C, Zinc, Vitamin A, MVI Coon, [place] K. ([id number]) The wound base is clean and she has had her first application of Apligraf today which has been bolstered in place and a 4-layer compression will be used. she is yet to be scheduled for surgery and I have asked her to let is know as soon as the date has been fixed so that we can suspend her application of Apligraf. She is going to continue to be off smoking Electronic Signature(s) Signed: 02/06/2017 2:34:15 PM By: Evlyn Kanner MD, FACS Entered By: Evlyn Kanner on 02/06/2017 14:34:14 [place], [name] ([id number]) -------------------------------------------------------------------------------- SuperBill Details Patient Name: [name]. Date of Service: 02/06/2017 Medical Record  Number: [id number] Patient Account Number: [id number] Date of Birth/Sex: [birth year] (61 y.o. Female) Treating RN: Ashok Cordia, Debi Primary Care Provider: Noemi Chapel Other Clinician: Referring Provider: Noemi Chapel Treating Provider/Extender: Evlyn Kanner Service Line: Outpatient Weeks in Treatment: 11 Diagnosis Coding ICD-10 Codes Code Description [phone] Non-pressure chronic ulcer of right calf with fat layer exposed I83.012 Varicose veins of right lower extremity with ulcer of calf F17.218 Nicotine dependence, cigarettes, with other nicotine-induced disorders M05.60 Rheumatoid arthritis of unspecified site with involvement of other organs and systems Facility Procedures CPT4: Description Modifier Quantity Code [phone] (Facility Use Only) Apligraf 44 SQ CM 44 CPT4: [id number] - SKIN SUB GRAFT TRNK/ARM/LEG 1 ICD-10 Description Diagnosis 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 layer exposed I83.012 Varicose veins of right lower extremity with ulcer of calf F17.218 Nicotine dependence,  cigarettes, with other nicotine-induced disorders M05.60 Rheumatoid arthritis of unspecified site with involvement of other organs and systems Physician Procedures CPT4: Description Modifier Quantity Code [id number] - WC PHYS SKIN SUB GRAFT TRNK/ARM/LEG 1 ICD-10 Description Diagnosis 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 layer exposed I83.012 Varicose veins of right lower extremity with ulcer  of calf F17.218 Nicotine dependence, cigarettes, with other nicotine-induced disorders M05.60 Rheumatoid arthritis of unspecified site with involvement of other organs and systems BANEEN, WIESELER ([id number]) Electronic Signature(s) Signed: 02/06/2017 2:34:28 PM By: Evlyn Kanner MD, FACS Entered By: Evlyn Kanner on 02/06/2017 14:34:28

## 2017-02-08 NOTE — Progress Notes (Signed)
[name], [name] ([id number]) Visit Report for 02/06/2017 Arrival Information Details Patient Name: [name], [name]. Date of Service: 02/06/2017 1:30 PM Medical Record Number: [id number] Patient Account Number: [id number] Date of Birth/Sex: Feb 07, 1956 (61 y.o. Female) Treating RN: Carolyne Fiscal, Debi Primary Care Johnny Latu: Einar Gip Other Clinician: Referring Rakeisha Nyce: Einar Gip Treating Coy Rochford/Extender: Frann Rider in Treatment: 11 Visit Information History Since Last Visit All ordered tests and consults were completed: No Patient Arrived: Ambulatory Added or deleted any medications: No Arrival Time: 13:42 Any new allergies or adverse reactions: No Accompanied By: self Had a fall or experienced change in No Transfer Assistance: None activities of daily living that may affect Patient Identification Verified: Yes risk of falls: Secondary Verification Process Yes Signs or symptoms of abuse/neglect since last No Completed: visito Patient Requires Transmission- No Hospitalized since last visit: No Based Precautions: Has Dressing in Place as Prescribed: Yes Patient Has Alerts: Yes Has Compression in Place as Prescribed: Yes Patient Alerts: L Leg ABI 1.2 Pain Present Now: No R Leg ABI 0.88 ABIs done in [place] Signature(s) Signed: 02/07/2017 4:53:33 PM By: Alric Quan Entered By: Alric Quan on 02/06/2017 13:43:38 [name], [name] ([id number]) -------------------------------------------------------------------------------- Encounter Discharge Information Details Patient Name: [name]. Date of Service: 02/06/2017 1:30 PM Medical Record Number: [id number] Patient Account Number: [id number] Date of Birth/Sex: 11-27-1956 (61 y.o. Female) Treating RN: Carolyne Fiscal, Debi Primary Care Quindell Shere: Einar Gip Other Clinician: Referring Callen Vancuren: Einar Gip Treating Lilyahna Sirmon/Extender: Frann Rider in Treatment: 11 Encounter  Discharge Information Items Discharge Pain Level: 0 Discharge Condition: Stable Ambulatory Status: Ambulatory Discharge Destination: Home Transportation: Private Auto Accompanied By: self Schedule Follow-up Appointment: Yes Medication Reconciliation completed and provided to Patient/Care No Norva Bowe: Provided on Clinical Summary of Care: 02/06/2017 Form Type Recipient Paper Patient JP Electronic Signature(s) Signed: 02/06/2017 2:22:42 PM By: Ruthine Dose Entered By: Ruthine Dose on 02/06/2017 14:22:42 [name], [name] ([id number]) -------------------------------------------------------------------------------- Lower Extremity Assessment Details Patient Name: [name]. Date of Service: 02/06/2017 1:30 PM Medical Record Number: [id number] Patient Account Number: [id number] Date of Birth/Sex: [birth year] (61 y.o. Female) Treating RN: Carolyne Fiscal, Debi Primary Care Zae Kirtz: Einar Gip Other Clinician: Referring Kriss Ishler: Einar Gip Treating Chaslyn Eisen/Extender: Frann Rider in Treatment: 11 Edema Assessment Assessed: [Left: No] [Right: No] E[Left: dema] [Right: :] Calf Left: Right: Point of Measurement: 32 cm From Medial Instep cm 42.5 cm Ankle Left: Right: Point of Measurement: 9 cm From Medial Instep cm 25 cm Vascular Assessment Pulses: Dorsalis Pedis Palpable: [Right:Yes] Posterior Tibial Extremity colors, hair growth, and conditions: Extremity Color: [Right:Hyperpigmented] Temperature of Extremity: [Right:Warm] Capillary Refill: [Right:< 3 seconds] Electronic Signature(s) Signed: 02/07/2017 4:53:33 PM By: Alric Quan Entered By: Alric Quan on 02/06/2017 13:53:22 [name], [name] ([id number]) -------------------------------------------------------------------------------- Multi Wound Chart Details Patient Name: [name]. Date of Service: 02/06/2017 1:30 PM Medical Record Number: [id number] Patient Account Number: [id number] Date  of Birth/Sex: 1956/06/21 (61 y.o. Female) Treating RN: Carolyne Fiscal, Debi Primary Care Lilo Wallington: Einar Gip Other Clinician: Referring Morningstar Toft: Einar Gip Treating Chaunta Bejarano/Extender: Frann Rider in Treatment: 11 Vital Signs Height(in): 63 Pulse(bpm): 80 Weight(lbs): 214.7 Blood Pressure 127/79 (mmHg): Body Mass Index(BMI): 38 Temperature(F): 97.7 Respiratory Rate 18 (breaths/min): Photos: [1:No Photos] [N/A:N/A] Wound Location: [1:Right Lower Leg - Anterior N/A] Wounding Event: [1:Gradually Appeared] [N/A:N/A] Primary Etiology: [1:Venous Leg Ulcer] [N/A:N/A] Comorbid History: [1:Chronic Obstructive Pulmonary Disease (COPD), Hypertension, Rheumatoid Arthritis] [N/A:N/A] Date Acquired: [1:05/19/2016] [N/A:N/A] Weeks of Treatment: [1:11] [N/A:N/A] Wound Status: [1:Open] [N/A:N/A] Measurements L x W x D 2.5x2.6x0.1 [N/A:N/A] (cm) Area (  cm) : [1:5.105] [N/A:N/A] Volume (cm) : [1:0.511] [N/A:N/A] % Reduction in Area: [1:71.50%] [N/A:N/A] % Reduction in Volume: 71.50% [N/A:N/A] Classification: [1:Full Thickness Without Exposed Support Structures] [N/A:N/A] Exudate Amount: [1:Large] [N/A:N/A] Exudate Type: [1:Serosanguineous] [N/A:N/A] Exudate Color: [1:red, brown] [N/A:N/A] Wound Margin: [1:Distinct, outline attached N/A] Granulation Amount: [1:Medium (34-66%)] [N/A:N/A] Granulation Quality: [1:Red, Hyper-granulation] [N/A:N/A] Necrotic Amount: [1:Small (1-33%)] [N/A:N/A] Exposed Structures: [1:Fat Layer (Subcutaneous N/A Tissue) Exposed: Yes Fascia: No] Tendon: No Muscle: No Joint: No Bone: No Epithelialization: Small (1-33%) N/A N/A Periwound Skin Texture: Excoriation: Yes N/A N/A Scarring: Yes Periwound Skin No Abnormalities Noted N/A N/A Moisture: Periwound Skin Color: Ecchymosis: Yes N/A N/A Erythema: Yes Erythema Location: Circumferential N/A N/A Temperature: No Abnormality N/A N/A Tenderness on Yes N/A N/A Palpation: Wound Preparation: Ulcer  Cleansing: N/A N/A Rinsed/Irrigated with Saline, Other: soap and water Topical Anesthetic Applied: Other: lidocaine 4% Procedures Performed: Cellular or Tissue Based N/A N/A Product Treatment Notes Wound #1 (Right, Anterior Lower Leg) 1. Cleansed with: Clean wound with Normal Saline Cleanse wound with antibacterial soap and water 2. Anesthetic Topical Lidocaine 4% cream to wound bed prior to debridement 3. Peri-wound Care: Skin Prep 4. Dressing Applied: Other dressing (specify in notes) 5. Secondary Dressing Applied ABD Pad Dry Gauze 7. Secured with 4-Layer Compression System - Right Lower Extremity Notes white dome paste anchor CHARCOAL, APLIGRAPH Electronic Signature(s) AMBERLEY, HAMLER ([id number]) Signed: 02/06/2017 2:31:10 PM By: Christin Fudge MD, FACS Entered By: Christin Fudge on 02/06/2017 14:31:10 [name], [name] ([id number]) -------------------------------------------------------------------------------- [place] Details Patient Name: KIANNAH, GRUNOW. Date of Service: 02/06/2017 1:30 PM Medical Record Number: [id number] Patient Account Number: [id number] Date of Birth/Sex: [birth year] (61 y.o. Female) Treating RN: Carolyne Fiscal, Debi Primary Care Dayana Dalporto: Einar Gip Other Clinician: Referring Alicia Ackert: Einar Gip Treating Christyana Corwin/Extender: Frann Rider in Treatment: 11 Active Inactive ` Abuse / Safety / Falls / Self Care Management Nursing Diagnoses: Potential for falls Goals: Patient will remain injury free Date Initiated: 11/18/2016 Target Resolution Date: 01/20/2017 Goal Status: Active Interventions: Assess fall risk on admission and as needed Assess self care needs on admission and as needed Notes: ` Nutrition Nursing Diagnoses: Imbalanced nutrition Goals: Patient/caregiver agrees to and verbalizes understanding of need to use nutritional supplements and/or vitamins as prescribed Date Initiated: 11/18/2016 Target  Resolution Date: 01/20/2017 Goal Status: Active Interventions: Assess patient nutrition upon admission and as needed per policy Notes: ` Orientation to the Wound Care Program Nursing Diagnoses: [name], [name] ([id number]) Knowledge deficit related to the wound healing center program Goals: Patient/caregiver will verbalize understanding of the [facility] Date Initiated: 11/18/2016 Target Resolution Date: 01/20/2017 Goal Status: Active Interventions: Provide education on orientation to the wound center Notes: ` Pain, Acute or Chronic Nursing Diagnoses: Pain, acute or chronic: actual or potential Potential alteration in comfort, pain Goals: Patient will verbalize adequate pain control and receive pain control interventions during procedures as needed Date Initiated: 11/18/2016 Target Resolution Date: 01/20/2017 Goal Status: Active Patient/caregiver will verbalize adequate pain control between visits Date Initiated: 11/18/2016 Target Resolution Date: 01/20/2017 Goal Status: Active Patient/caregiver will verbalize comfort level met Date Initiated: 11/18/2016 Target Resolution Date: 01/20/2017 Goal Status: Active Interventions: Assess comfort goal upon admission Complete pain assessment as per visit requirements Notes: ` Soft Tissue Infection Nursing Diagnoses: Impaired tissue integrity Knowledge deficit related to disease process and management Knowledge deficit related to home infection control: handwashing, handling of soiled dressings, supply storage Goals: [name], [name] ([id number]) Patient/caregiver will verbalize understanding of or measures to prevent  infection and contamination in the home setting Date Initiated: 11/18/2016 Target Resolution Date: 01/20/2017 Goal Status: Active Patient's soft tissue infection will resolve Date Initiated: 11/18/2016 Target Resolution Date: 01/20/2017 Goal Status: Active Interventions: Assess signs and symptoms of  infection every visit Provide education on infection Treatment Activities: Education provided on Infection : 12/26/2016 Notes: ` Wound/Skin Impairment Nursing Diagnoses: Impaired tissue integrity Goals: Ulcer/skin breakdown will have a volume reduction of 30% by week 4 Date Initiated: 11/18/2016 Target Resolution Date: 01/20/2017 Goal Status: Active Ulcer/skin breakdown will have a volume reduction of 50% by week 8 Date Initiated: 11/18/2016 Target Resolution Date: 01/20/2017 Goal Status: Active Ulcer/skin breakdown will have a volume reduction of 80% by week 12 Date Initiated: 11/18/2016 Target Resolution Date: 01/20/2017 Goal Status: Active Interventions: Assess patient/caregiver ability to perform ulcer/skin care regimen upon admission and as needed Assess ulceration(s) every visit Provide education on smoking Notes: Electronic Signature(s) Signed: 02/07/2017 4:53:33 PM By: Alric Quan Entered By: Alric Quan on 02/06/2017 14:08:57 [name], [name] ([id number]) -------------------------------------------------------------------------------- Pain Assessment Details Patient Name: [name]. Date of Service: 02/06/2017 1:30 PM Medical Record Number: [id number] Patient Account Number: [id number] Date of Birth/Sex: 1956-03-11 (61 y.o. Female) Treating RN: Carolyne Fiscal, Debi Primary Care Wilmer Santillo: Einar Gip Other Clinician: Referring Salomon Ganser: Einar Gip Treating Gaylin Osoria/Extender: Frann Rider in Treatment: 11 Active Problems Location of Pain Severity and Description of Pain Patient Has Paino No Site Locations With Dressing Change: No Pain Management and Medication Current Pain Management: Electronic Signature(s) Signed: 02/07/2017 4:53:33 PM By: Alric Quan Entered By: Alric Quan on 02/06/2017 13:44:40 [name], [name] ([id number]) -------------------------------------------------------------------------------- Patient/Caregiver Education  Details Patient Name: [name]. Date of Service: 02/06/2017 1:30 PM Medical Record Number: [id number] Patient Account Number: [id number] Date of Birth/Gender: Oct 14, 1956 (61 y.o. Female) Treating RN: Carolyne Fiscal, Debi Primary Care Physician: Einar Gip Other Clinician: Referring Physician: Einar Gip Treating Physician/Extender: Frann Rider in Treatment: 11 Education Assessment Education Provided To: Patient Education Topics Provided Wound/Skin Impairment: Handouts: Other: change dressing as ordered Methods: Demonstration, Explain/Verbal Responses: State content correctly Electronic Signature(s) Signed: 02/07/2017 4:53:33 PM By: Alric Quan Entered By: Alric Quan on 02/06/2017 13:57:29 [name], [name] ([id number]) -------------------------------------------------------------------------------- Wound Assessment Details Patient Name: [name]. Date of Service: 02/06/2017 1:30 PM Medical Record Number: [id number] Patient Account Number: [id number] Date of Birth/Sex: May 09, 1956 (61 y.o. Female) Treating RN: Carolyne Fiscal, Debi Primary Care Saifan Rayford: Einar Gip Other Clinician: Referring Barry Faircloth: Einar Gip Treating Nykira Reddix/Extender: Frann Rider in Treatment: 11 Wound Status Wound Number: 1 Primary Venous Leg Ulcer Etiology: Wound Location: Right Lower Leg - Anterior Wound Open Wounding Event: Gradually Appeared Status: Date Acquired: 05/19/2016 Comorbid Chronic Obstructive Pulmonary Weeks Of Treatment: 11 History: Disease (COPD), Hypertension, Clustered Wound: No Rheumatoid Arthritis Photos Photo Uploaded By: Alric Quan on 02/06/2017 16:11:34 Wound Measurements Length: (cm) 2.5 Width: (cm) 2.6 Depth: (cm) 0.1 Area: (cm) 5.105 Volume: (cm) 0.511 % Reduction in Area: 71.5% % Reduction in Volume: 71.5% Epithelialization: Small (1-33%) Tunneling: No Undermining: No Wound Description Full Thickness Without  Exposed Classification: Support Structures Wound Margin: Distinct, outline attached Exudate Large Amount: Exudate Type: Serosanguineous Exudate Color: red, brown Foul Odor After Cleansing: No Slough/Fibrino Yes Wound Bed Granulation Amount: Medium (34-66%) Exposed Structure Granulation Quality: Red, Hyper-granulation Fascia Exposed: No Collyer, Sani K. ([id number]) Necrotic Amount: Small (1-33%) Fat Layer (Subcutaneous Tissue) Exposed: Yes Necrotic Quality: Adherent Slough Tendon Exposed: No Muscle Exposed: No Joint Exposed: No Bone Exposed: No Periwound Skin Texture Texture Color No Abnormalities Noted: No No Abnormalities  Noted: No Excoriation: Yes Ecchymosis: Yes Scarring: Yes Erythema: Yes Erythema Location: Circumferential Moisture No Abnormalities Noted: No Temperature / Pain Temperature: No Abnormality Tenderness on Palpation: Yes Wound Preparation Ulcer Cleansing: Rinsed/Irrigated with Saline, Other: soap and water, Topical Anesthetic Applied: Other: lidocaine 4%, Treatment Notes Wound #1 (Right, Anterior Lower Leg) 1. Cleansed with: Clean wound with Normal Saline Cleanse wound with antibacterial soap and water 2. Anesthetic Topical Lidocaine 4% cream to wound bed prior to debridement 3. Peri-wound Care: Skin Prep 4. Dressing Applied: Other dressing (specify in notes) 5. Secondary Dressing Applied ABD Pad Dry Gauze 7. Secured with 4-Layer Compression System - Right Lower Extremity Notes white dome paste anchor CHARCOAL, APLIGRAPH Electronic Signature(s) Signed: 02/07/2017 4:53:33 PM By: Alric Quan Entered By: Alric Quan on 02/06/2017 13:52:58 [place], [name] ([id number]) -------------------------------------------------------------------------------- Vitals Details Patient Name: [name]. Date of Service: 02/06/2017 1:30 PM Medical Record Number: [id number] Patient Account Number: [id number] Date of Birth/Sex: [birth year]  (61 y.o. Female) Treating RN: Carolyne Fiscal, Debi Primary Care Shloime Keilman: Einar Gip Other Clinician: Referring Kahleb Mcclane: Einar Gip Treating Omega Durante/Extender: Frann Rider in Treatment: 11 Vital Signs Time Taken: 13:44 Temperature (F): 97.7 Height (in): 63 Pulse (bpm): 80 Weight (lbs): 214.7 Respiratory Rate (breaths/min): 18 Body Mass Index (BMI): 38 Blood Pressure (mmHg): 127/79 Reference Range: 80 - 120 mg / dl Electronic Signature(s) Signed: 02/07/2017 4:53:33 PM By: Alric Quan Entered By: Alric Quan on 02/06/2017 13:45:17

## 2017-02-11 ENCOUNTER — Other Ambulatory Visit: Payer: Self-pay | Admitting: *Deleted

## 2017-02-11 DIAGNOSIS — I83012 Varicose veins of right lower extremity with ulcer of calf: Secondary | ICD-10-CM

## 2017-02-11 DIAGNOSIS — L97219 Non-pressure chronic ulcer of right calf with unspecified severity: Principal | ICD-10-CM

## 2017-02-13 ENCOUNTER — Encounter: Payer: BLUE CROSS/BLUE SHIELD | Attending: Surgery | Admitting: Surgery

## 2017-02-13 ENCOUNTER — Encounter: Payer: Self-pay | Admitting: Vascular Surgery

## 2017-02-13 DIAGNOSIS — I10 Essential (primary) hypertension: Secondary | ICD-10-CM | POA: Diagnosis not present

## 2017-02-13 DIAGNOSIS — I83012 Varicose veins of right lower extremity with ulcer of calf: Secondary | ICD-10-CM | POA: Diagnosis present

## 2017-02-13 DIAGNOSIS — Z79899 Other long term (current) drug therapy: Secondary | ICD-10-CM | POA: Diagnosis not present

## 2017-02-13 DIAGNOSIS — Z880 Allergy status to penicillin: Secondary | ICD-10-CM | POA: Insufficient documentation

## 2017-02-13 DIAGNOSIS — L97212 Non-pressure chronic ulcer of right calf with fat layer exposed: Secondary | ICD-10-CM | POA: Insufficient documentation

## 2017-02-13 DIAGNOSIS — F17218 Nicotine dependence, cigarettes, with other nicotine-induced disorders: Secondary | ICD-10-CM | POA: Diagnosis not present

## 2017-02-13 DIAGNOSIS — J449 Chronic obstructive pulmonary disease, unspecified: Secondary | ICD-10-CM | POA: Diagnosis not present

## 2017-02-13 DIAGNOSIS — M0560 Rheumatoid arthritis of unspecified site with involvement of other organs and systems: Secondary | ICD-10-CM | POA: Insufficient documentation

## 2017-02-14 ENCOUNTER — Encounter: Payer: Self-pay | Admitting: Vascular Surgery

## 2017-02-14 NOTE — Progress Notes (Signed)
See other note, same date

## 2017-02-14 NOTE — Progress Notes (Addendum)
BARB, [name] ([id number]) Visit Report for 02/13/2017 Chief Complaint Document Details Patient Name: [name], [name]. Date of Service: 02/13/2017 12:30 PM Medical Record Number: [id number] Patient Account Number: [id number] Date of Birth/Sex: Jun 22, 1956 (61 y.o. Female) Treating RN: Ashok Cordia, Debi Primary Care Provider: Noemi Chapel Other Clinician: Referring Provider: Noemi Chapel Treating Provider/Extender: Rudene Re in Treatment: 12 Information Obtained from: Patient Chief Complaint Patient presents for treatment of an open ulcer due to venous insufficiency to her right lower extremity which she's had for over 6 months Electronic Signature(s) Signed: 02/13/2017 1:06:47 PM By: Evlyn Kanner MD, FACS Entered By: Evlyn Kanner on 02/13/2017 13:06:47 [name], [name] ([id number]) -------------------------------------------------------------------------------- HPI Details Patient Name: [name]. Date of Service: 02/13/2017 12:30 PM Medical Record Number: [id number] Patient Account Number: [id number] Date of Birth/Sex: [birth year] (61 y.o. Female) Treating RN: Ashok Cordia, Debi Primary Care Provider: Noemi Chapel Other Clinician: Referring Provider: Noemi Chapel Treating Provider/Extender: Rudene Re in Treatment: 12 History of Present Illness Location: ulcerated area on the right lower extremity Quality: Patient reports experiencing a sharp pain to affected area(s). Severity: Patient states wound are getting worse. Duration: Patient has had the wound for > 6 months prior to seeking treatment at the wound center Timing: Pain in wound is constant (hurts all the time) Context: The wound appeared gradually over time Modifying Factors: Other treatment(s) tried include:recent admission to hospital for a cellulitis and was treated with antibiotics and an Unna boot Associated Signs and Symptoms: Patient reports having increase swelling. HPI Description:  61 year old patient who was recently admitted to the hospital between 11/14/2016 and 11/15/2016 for right lower extremity ulcer with cellulitis. she was asked to stop clindamycin and doxycycline and her prednisone and take Levaquin 500 mg daily by mouth. she was treated for a right lower extremity possibly due to venous stasis versus vascular insufficiency versus infection. She received IV vancomycin during her hospital stay. He is known to have rheumatoid arthritis and stopped taking her treatment because of lack of insurance. during her hospital stay vascular studies were done -- ABIs were 0.88 on the right and 1.1 on the left, she had triphasic flow on both lower extremities and a plain film of the right tibia and fibula showed no signs of osteomyelitis. She is not a diabetic. She is a smoker however and continued to smoke. lab work noted that her sedimentation rate was 29, CRP was less than 0.8, hemoglobin A1c was 5.2 12/02/2016 -- due to a family emergency and social economic reasons she has been unable to keep her appointment with the vascular department for her workup and is expecting insurance coverage after January 1 12/19/2016 -- she continues to smoke. As have new insurance and hopefully will get her vascular test done as soon as possible. 12/26/2016 -- she has quit smoking for the last 3 days and I have commended her. She did receive her appointment from [facility] [place] and it is later this month 01/13/2017 -- she has not been back to see [place] since January 11 and had her compression wrap on all these days almost 3 weeks. I have sternly cautioned her regarding this. Her appointment for a venous duplex study is next Thursday. 01/23/2017 ---- lower extremity venous duplex reflux evaluation shows small saphenous vein is competent but there is evidence of great saphenous vein reflux in the right lower extremity with no evidence of DVT or SVT. There is also deep vein reflux in  the right lower extremity. A vascular consult was  recommended. Note the left side was not studied. Lower extremity arterial evaluation was done -- unable to obtain right dorsalis pedis or posterior tibial pressures due to an open wound. Posterior tibial and dorsalis pedis waveform appears strong and triphasic. Right TBI was 0.88. Left ABI was 1.21. [name], [name] ([id number]) 01/30/2017 -- he has an appointment to see the vascular surgeons at [facility] this coming Thursday. her Apligraf has been authorized by her [employer] and we will get this for her for next week. 2/22 2018 -- she was seen by Dr. Tawanna Cooler Early this morning -- she was found to have severe CEAP class VI venous stasis disease and had recommended laser ablation of her right great saphenous vein and stab phlebectomy's of multiple tributary varicosities to recent venous hypertension and improve her wound healing and reduce the risk of recurrent ulceration. She has had her first application of Apligraf today. 02/13/2017 -- her surgery has been scheduled for next Thursday and hence we will not see her to the Monday after. He will be prepared to apply an Apligraf on that day Electronic Signature(s) Signed: 02/13/2017 1:07:24 PM By: Evlyn Kanner MD, FACS Entered By: Evlyn Kanner on 02/13/2017 13:07:24 [name] ([id number]) -------------------------------------------------------------------------------- Physical Exam Details Patient Name: [name]. Date of Service: 02/13/2017 12:30 PM Medical Record Number: [id number] Patient Account Number: [id number] Date of Birth/Sex: 1956-07-14 (61 y.o. Female) Treating RN: Ashok Cordia, Debi Primary Care Provider: Noemi Chapel Other Clinician: Referring Provider: Noemi Chapel Treating Provider/Extender: Rudene Re in Treatment: 12 Constitutional . Pulse regular. Respirations normal and unlabored. Afebrile. . Eyes Nonicteric. Reactive to light. Ears, Nose,  Mouth, and Throat Lips, teeth, and gums WNL.Marland Kitchen Moist mucosa without lesions. Neck supple and nontender. No palpable supraclavicular or cervical adenopathy. Normal sized without goiter. Respiratory WNL. No retractions.. Breath sounds WNL, No rubs, rales, rhonchi, or wheeze.. Cardiovascular Heart rhythm and rate regular, no murmur or gallop.. Pedal Pulses WNL. No clubbing, cyanosis or edema. Chest Breasts symmetical and no nipple discharge.. Breast tissue WNL, no masses, lumps, or tenderness.. Lymphatic No adneopathy. No adenopathy. No adenopathy. Musculoskeletal Adexa without tenderness or enlargement.. Digits and nails w/o clubbing, cyanosis, infection, petechiae, ischemia, or inflammatory conditions.. Integumentary (Hair, Skin) No suspicious lesions. No crepitus or fluctuance. No peri-wound warmth or erythema. No masses.Marland Kitchen Psychiatric Judgement and insight Intact.. No evidence of depression, anxiety, or agitation.. Notes the Apligraf is in place and the surrounding skin and lower extremity is within normal limits as expected. No evidence of cellulitis but lymphedema persists. Electronic Signature(s) Signed: 02/13/2017 1:07:49 PM By: Evlyn Kanner MD, FACS Entered By: Evlyn Kanner on 02/13/2017 13:07:49 Sliger, [name] ([id number]) -------------------------------------------------------------------------------- Physician Orders Details Patient Name: [name], [name]. Date of Service: 02/13/2017 12:30 PM Medical Record Number: [id number] Patient Account Number: [id number] Date of Birth/Sex: [birth year] (61 y.o. Female) Treating RN: Ashok Cordia, Debi Primary Care Provider: Noemi Chapel Other Clinician: Referring Provider: Noemi Chapel Treating Provider/Extender: Rudene Re in Treatment: 12 Verbal / Phone Orders: Yes Clinician: Ashok Cordia, Debi Read Back and Verified: Yes Diagnosis Coding ICD-10 Coding Code Description [phone] Non-pressure chronic ulcer of right calf with  fat layer exposed I83.012 Varicose veins of right lower extremity with ulcer of calf F17.218 Nicotine dependence, cigarettes, with other nicotine-induced disorders M05.60 Rheumatoid arthritis of unspecified site with involvement of other organs and systems Wound Cleansing Wound #1 Right,Anterior Lower Leg o Clean wound with Normal Saline. o Cleanse wound with mild soap and water Anesthetic Wound #1 Right,Anterior Lower Leg o Topical Lidocaine  4% cream applied to wound bed prior to debridement Skin Barriers/Peri-Wound Care Wound #1 Right,Anterior Lower Leg o Skin Prep Primary Wound Dressing Wound #1 Right,Anterior Lower Leg o Other: - apligraph Secondary Dressing Wound #1 Right,Anterior Lower Leg o ABD pad o Dry Gauze o Drawtex o Other - charcoal Dressing Change Frequency Wound #1 Right,Anterior Lower Leg o Change dressing every week ZEPHANIAH, HOWREN ([id number]) Follow-up Appointments Wound #1 Right,Anterior Lower Leg o Return Appointment in 1 week. Edema Control Wound #1 Right,Anterior Lower Leg o 4-Layer Compression System - Right Lower Extremity - unna to anchor o Elevate legs to the level of the heart and pump ankles as often as possible Electronic Signature(s) Signed: 02/13/2017 4:14:08 PM By: Evlyn Kanner MD, FACS Signed: 02/13/2017 4:36:14 PM By: Alejandro Mulling Previous Signature: 02/13/2017 1:07:56 PM Version By: Evlyn Kanner MD, FACS Entered By: Alejandro Mulling on 02/13/2017 13:13:47 [name], [name] ([id number]) -------------------------------------------------------------------------------- Problem List Details Patient Name: [name], [name]. Date of Service: 02/13/2017 12:30 PM Medical Record Number: [id number] Patient Account Number: [id number] Date of Birth/Sex: [birth year] (61 y.o. Female) Treating RN: Ashok Cordia, Debi Primary Care Provider: Noemi Chapel Other Clinician: Referring Provider: Noemi Chapel Treating Provider/Extender:  Rudene Re in Treatment: 12 Active Problems ICD-10 Encounter Code Description Active Date Diagnosis 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 layer 11/18/2016 Yes exposed I83.012 Varicose veins of right lower extremity with ulcer of calf 11/18/2016 Yes F17.218 Nicotine dependence, cigarettes, with other nicotine- 11/18/2016 Yes induced disorders M05.60 Rheumatoid arthritis of unspecified site with involvement 11/18/2016 Yes of other organs and systems Inactive Problems Resolved Problems Electronic Signature(s) Signed: 02/13/2017 1:06:27 PM By: Evlyn Kanner MD, FACS Entered By: Evlyn Kanner on 02/13/2017 13:06:27 Heesch, [name] ([id number]) -------------------------------------------------------------------------------- Progress Note Details Patient Name: [name]. Date of Service: 02/13/2017 12:30 PM Medical Record Number: [id number] Patient Account Number: [id number] Date of Birth/Sex: 10/03/1956 (61 y.o. Female) Treating RN: Ashok Cordia, Debi Primary Care Provider: Noemi Chapel Other Clinician: Referring Provider: Noemi Chapel Treating Provider/Extender: Rudene Re in Treatment: 12 Subjective Chief Complaint Information obtained from Patient Patient presents for treatment of an open ulcer due to venous insufficiency to her right lower extremity which she's had for over 6 months History of Present Illness (HPI) The following HPI elements were documented for the patient's wound: Location: ulcerated area on the right lower extremity Quality: Patient reports experiencing a sharp pain to affected area(s). Severity: Patient states wound are getting worse. Duration: Patient has had the wound for > 6 months prior to seeking treatment at the wound center Timing: Pain in wound is constant (hurts all the time) Context: The wound appeared gradually over time Modifying Factors: Other treatment(s) tried include:recent admission to hospital for a  cellulitis and was treated with antibiotics and an Unna boot Associated Signs and Symptoms: Patient reports having increase swelling. 61 year old patient who was recently admitted to the hospital between 11/14/2016 and 11/15/2016 for right lower extremity ulcer with cellulitis. she was asked to stop clindamycin and doxycycline and her prednisone and take Levaquin 500 mg daily by mouth. she was treated for a right lower extremity possibly due to venous stasis versus vascular insufficiency versus infection. She received IV vancomycin during her hospital stay. He is known to have rheumatoid arthritis and stopped taking her treatment because of lack of insurance. during her hospital stay vascular studies were done -- ABIs were 0.88 on the right and 1.1 on the left, she had triphasic flow on both lower extremities and a plain  film of the right tibia and fibula showed no signs of osteomyelitis. She is not a diabetic. She is a smoker however and continued to smoke. lab work noted that her sedimentation rate was 29, CRP was less than 0.8, hemoglobin A1c was 5.2 12/02/2016 -- due to a family emergency and social economic reasons she has been unable to keep her appointment with the vascular department for her workup and is expecting insurance coverage after January 1 12/19/2016 -- she continues to smoke. As have new insurance and hopefully will get her vascular test done as soon as possible. 12/26/2016 -- she has quit smoking for the last 3 days and I have commended her. She did receive her appointment from [facility] vascular [place] and it is later this month 01/13/2017 -- she has not been back to see [place] since January 11 and had her compression wrap on all these days almost 3 weeks. I have sternly cautioned her regarding this. Her appointment for a venous [name], [name]. ([id number]) duplex study is next Thursday. 01/23/2017 ---- lower extremity venous duplex reflux evaluation shows small saphenous  vein is competent but there is evidence of great saphenous vein reflux in the right lower extremity with no evidence of DVT or SVT. There is also deep vein reflux in the right lower extremity. A vascular consult was recommended. Note the left side was not studied. Lower extremity arterial evaluation was done -- unable to obtain right dorsalis pedis or posterior tibial pressures due to an open wound. Posterior tibial and dorsalis pedis waveform appears strong and triphasic. Right TBI was 0.88. Left ABI was 1.21. 01/30/2017 -- he has an appointment to see the vascular surgeons at [facility] this coming Thursday. her Apligraf has been authorized by her [employer] and we will get this for her for next week. 2/22 2018 -- she was seen by Dr. Tawanna Cooler Early this morning -- she was found to have severe CEAP class VI venous stasis disease and had recommended laser ablation of her right great saphenous vein and stab phlebectomy's of multiple tributary varicosities to recent venous hypertension and improve her wound healing and reduce the risk of recurrent ulceration. She has had her first application of Apligraf today. 02/13/2017 -- her surgery has been scheduled for next Thursday and hence we will not see her to the Monday after. He will be prepared to apply an Apligraf on that day Objective Constitutional Pulse regular. Respirations normal and unlabored. Afebrile. Vitals Time Taken: 12:41 PM, Height: 63 in, Weight: 214.7 lbs, BMI: 38, Temperature: 97.9 F, Pulse: 86 bpm, Respiratory Rate: 18 breaths/min, Blood Pressure: 166/78 mmHg. Eyes Nonicteric. Reactive to light. Ears, Nose, Mouth, and Throat Lips, teeth, and gums WNL.Marland Kitchen Moist mucosa without lesions. Neck supple and nontender. No palpable supraclavicular or cervical adenopathy. Normal sized without goiter. Respiratory WNL. No retractions.. Breath sounds WNL, No rubs, rales, rhonchi, or wheeze.. Cardiovascular Heart rhythm and rate  regular, no murmur or gallop.. Pedal Pulses WNL. No clubbing, cyanosis or edema. Chest [name], [name] ([id number]) Breasts symmetical and no nipple discharge.. Breast tissue WNL, no masses, lumps, or tenderness.. Lymphatic No adneopathy. No adenopathy. No adenopathy. Musculoskeletal Adexa without tenderness or enlargement.. Digits and nails w/o clubbing, cyanosis, infection, petechiae, ischemia, or inflammatory conditions.Marland Kitchen Psychiatric Judgement and insight Intact.. No evidence of depression, anxiety, or agitation.. General Notes: the Apligraf is in place and the surrounding skin and lower extremity is within normal limits as expected. No evidence of cellulitis but lymphedema persists. Integumentary (Hair, Skin) No  suspicious lesions. No crepitus or fluctuance. No peri-wound warmth or erythema. No masses.. Wound #1 status is Open. Original cause of wound was Gradually Appeared. The wound is located on the Right,Anterior Lower Leg. The wound measures 2.5cm length x 2.6cm width x 0.1cm depth; 5.105cm^2 area and 0.511cm^3 volume. There is Fat Layer (Subcutaneous Tissue) Exposed exposed. There is no tunneling noted. There is a large amount of serosanguineous drainage noted. The wound margin is distinct with the outline attached to the wound base. There is medium (34-66%) red granulation within the wound bed. There is a small (1-33%) amount of necrotic tissue within the wound bed including Adherent Slough. The periwound skin appearance exhibited: Excoriation, Scarring, Ecchymosis, Erythema. The surrounding wound skin color is noted with erythema which is circumferential. Periwound temperature was noted as No Abnormality. The periwound has tenderness on palpation. General Notes: all measurements and everything the same this is for a skin graft check Assessment Active Problems ICD-10 L97.212 - Non-pressure chronic ulcer of right calf with fat layer exposed I83.012 - Varicose veins of right  lower extremity with ulcer of calf F17.218 - Nicotine dependence, cigarettes, with other nicotine-induced disorders M05.60 - Rheumatoid arthritis of unspecified site with involvement of other organs and systems Plan [name], [name]. ([id number]) Wound Cleansing: Wound #1 Right,Anterior Lower Leg: Clean wound with Normal Saline. Cleanse wound with mild soap and water Anesthetic: Wound #1 Right,Anterior Lower Leg: Topical Lidocaine 4% cream applied to wound bed prior to debridement Skin Barriers/Peri-Wound Care: Wound #1 Right,Anterior Lower Leg: Skin Prep Primary Wound Dressing: Wound #1 Right,Anterior Lower Leg: Other: - apligraph Secondary Dressing: Wound #1 Right,Anterior Lower Leg: ABD pad Dry Gauze Drawtex Other - charcoal Dressing Change Frequency: Wound #1 Right,Anterior Lower Leg: Change dressing every week Follow-up Appointments: Wound #1 Right,Anterior Lower Leg: Return Appointment in 1 week. Edema Control: Wound #1 Right,Anterior Lower Leg: 4-Layer Compression System - Right Lower Extremity - unna to anchor Elevate legs to the level of the heart and pump ankles as often as possible The wound base is clean and she has had the Apligraf in place today which has been bolstered in place and a 4-layer compression will be used. she is scheduled for surgery next Thursday and hence we will be ready for the next application of Apligraf on Monday, March 12. She is going to continue to be off smoking Electronic Signature(s) Signed: 02/13/2017 4:22:27 PM By: Evlyn Kanner MD, FACS Previous Signature: 02/13/2017 1:09:53 PM Version By: Evlyn Kanner MD, FACS Entered By: Evlyn Kanner on 02/13/2017 16:22:26 [name], [name] ([id number]) [name], [name] ([id number]) -------------------------------------------------------------------------------- SuperBill Details Patient Name: [name]. Date of Service: 02/13/2017 Medical Record Number: [id number] Patient Account Number:  [id number] Date of Birth/Sex: 03/27/1956 (61 y.o. Female) Treating RN: Ashok Cordia, Debi Primary Care Provider: Noemi Chapel Other Clinician: Referring Provider: Noemi Chapel Treating Provider/Extender: Evlyn Kanner Service Line: Outpatient Weeks in Treatment: 12 Diagnosis Coding ICD-10 Codes Code Description [phone] Non-pressure chronic ulcer of right calf with fat layer exposed I83.012 Varicose veins of right lower extremity with ulcer of calf F17.218 Nicotine dependence, cigarettes, with other nicotine-induced disorders M05.60 Rheumatoid arthritis of unspecified site with involvement of other organs and systems Facility Procedures CPT4: Description Modifier Quantity Code [id number] (Facility Use Only) 42683MH - APPLY MULTLAY COMPRS LWR RT 1 LEG Physician Procedures CPT4: Description Modifier Quantity Code [id number] 99213 - WC PHYS LEVEL 3 - EST PT 1 ICD-10 Description Diagnosis 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 layer exposed I83.012 Varicose veins  of right lower extremity with ulcer of calf  F17.218 Nicotine dependence, cigarettes, with other nicotine-induced disorders M05.60 Rheumatoid arthritis of unspecified site with involvement of other organs and systems Electronic Signature(s) Signed: 02/13/2017 4:25:46 PM By: Evlyn Kanner MD, FACS Previous Signature: 02/13/2017 4:14:08 PM Version By: Evlyn Kanner MD, FACS Previous Signature: 02/13/2017 1:10:11 PM Version By: Evlyn Kanner MD, FACS Entered By: Evlyn Kanner on 02/13/2017 16:25:45

## 2017-02-14 NOTE — Progress Notes (Signed)
[name], [name] ([id number]) Visit Report for 02/13/2017 Arrival Information Details Patient Name: [name], [name]. Date of Service: 02/13/2017 12:30 PM Medical Record Number: [id number] Patient Account Number: [id number] Date of Birth/Sex: [birth year] (61 y.o. Female) Treating RN: Carolyne Fiscal, Debi Primary Care Arvell Pulsifer: Einar Gip Other Clinician: Referring Dagoberto Nealy: Einar Gip Treating [name]/Extender: Frann Rider in Treatment: 12 Visit Information History Since Last Visit All ordered tests and consults were completed: No Patient Arrived: Ambulatory Added or deleted any medications: No Arrival Time: 12:40 Any new allergies or adverse reactions: No Accompanied By: self Had a fall or experienced change in No Transfer Assistance: None activities of daily living that may affect Patient Identification Verified: Yes risk of falls: Secondary Verification Process Yes Signs or symptoms of abuse/neglect since last No Completed: visito Patient Requires Transmission- No Hospitalized since last visit: No Based Precautions: Has Dressing in Place as Prescribed: Yes Patient Has Alerts: Yes Has Compression in Place as Prescribed: Yes Patient Alerts: L Leg ABI 1.2 Pain Present Now: No R Leg ABI 0.88 ABIs done in [place] Signature(s) Signed: 02/13/2017 4:36:14 PM By: Alric Quan Entered By: Alric Quan on 02/13/2017 12:41:20 Behlke, [name] ([id number]) -------------------------------------------------------------------------------- Encounter Discharge Information Details Patient Name: [name]. Date of Service: 02/13/2017 12:30 PM Medical Record Number: [id number] Patient Account Number: [id number] Date of Birth/Sex: 1956/05/07 (61 y.o. Female) Treating RN: Carolyne Fiscal, Debi Primary Care Hadar Elgersma: Einar Gip Other Clinician: Referring Zoe Creasman: Einar Gip Treating Refugio Mcconico/Extender: Frann Rider in Treatment: 12 Encounter  Discharge Information Items Discharge Pain Level: 0 Discharge Condition: Stable Ambulatory Status: Ambulatory Discharge Destination: Home Transportation: Private Auto Accompanied By: self Schedule Follow-up Appointment: Yes Medication Reconciliation completed and provided to Patient/Care No Delorese Sellin: Provided on Clinical Summary of Care: 02/13/2017 Form Type Recipient Paper Patient JP Electronic Signature(s) Signed: 02/13/2017 4:36:14 PM By: Alric Quan Previous Signature: 02/13/2017 1:14:11 PM Version By: Ruthine Dose Entered By: Alric Quan on 02/13/2017 14:18:35 Grego, [name] ([id number]) -------------------------------------------------------------------------------- Lower Extremity Assessment Details Patient Name: [name]. Date of Service: 02/13/2017 12:30 PM Medical Record Number: [id number] Patient Account Number: [id number] Date of Birth/Sex: Oct 05, 1956 (61 y.o. Female) Treating RN: Carolyne Fiscal, Debi Primary Care Nija Koopman: Einar Gip Other Clinician: Referring Tacoya Altizer: Einar Gip Treating Gennavieve Huq/Extender: Frann Rider in Treatment: 12 Edema Assessment Assessed: [Left: No] [Right: No] E[Left: dema] [Right: :] Calf Left: Right: Point of Measurement: 32 cm From Medial Instep cm 42.4 cm Ankle Left: Right: Point of Measurement: 9 cm From Medial Instep cm 25 cm Vascular Assessment Pulses: Dorsalis Pedis Palpable: [Right:Yes] Posterior Tibial Extremity colors, hair growth, and conditions: Extremity Color: [Right:Hyperpigmented] Temperature of Extremity: [Right:Warm] Capillary Refill: [Right:< 3 seconds] Electronic Signature(s) Signed: 02/13/2017 4:36:14 PM By: Alric Quan Entered By: Alric Quan on 02/13/2017 12:52:47 Hult, [name] ([id number]) -------------------------------------------------------------------------------- Multi Wound Chart Details Patient Name: [name]. Date of Service: 02/13/2017 12:30  PM Medical Record Number: [id number] Patient Account Number: [id number] Date of Birth/Sex: 07-30-1956 (61 y.o. Female) Treating RN: Carolyne Fiscal, Debi Primary Care Duke Weisensel: Einar Gip Other Clinician: Referring Jolean Madariaga: Einar Gip Treating Gursimran Litaker/Extender: Frann Rider in Treatment: 12 Vital Signs Height(in): 63 Pulse(bpm): 86 Weight(lbs): 214.7 Blood Pressure 166/78 (mmHg): Body Mass Index(BMI): 38 Temperature(F): 97.9 Respiratory Rate 18 (breaths/min): Photos: [1:No Photos] [N/A:N/A] Wound Location: [1:Right Lower Leg - Anterior N/A] Wounding Event: [1:Gradually Appeared] [N/A:N/A] Primary Etiology: [1:Venous Leg Ulcer] [N/A:N/A] Comorbid History: [1:Chronic Obstructive Pulmonary Disease (COPD), Hypertension, Rheumatoid Arthritis] [N/A:N/A] Date Acquired: [1:05/19/2016] [N/A:N/A] Weeks of Treatment: [1:12] [N/A:N/A] Wound Status: [1:Open] [N/A:N/A] Measurements  L x W x D 2.5x2.6x0.1 [N/A:N/A] (cm) Area (cm) : [1:5.105] [N/A:N/A] Volume (cm) : [1:0.511] [N/A:N/A] % Reduction in Area: [1:71.50%] [N/A:N/A] % Reduction in Volume: 71.50% [N/A:N/A] Classification: [1:Full Thickness Without Exposed Support Structures] [N/A:N/A] Exudate Amount: [1:Large] [N/A:N/A] Exudate Type: [1:Serosanguineous] [N/A:N/A] Exudate Color: [1:red, brown] [N/A:N/A] Wound Margin: [1:Distinct, outline attached N/A] Granulation Amount: [1:Medium (34-66%)] [N/A:N/A] Granulation Quality: [1:Red, Hyper-granulation] [N/A:N/A] Necrotic Amount: [1:Small (1-33%)] [N/A:N/A] Exposed Structures: [1:Fat Layer (Subcutaneous N/A Tissue) Exposed: Yes Fascia: No] Tendon: No Muscle: No Joint: No Bone: No Epithelialization: Small (1-33%) N/A N/A Periwound Skin Texture: Excoriation: Yes N/A N/A Scarring: Yes Periwound Skin No Abnormalities Noted N/A N/A Moisture: Periwound Skin Color: Ecchymosis: Yes N/A N/A Erythema: Yes Erythema Location: Circumferential N/A N/A Temperature: No  Abnormality N/A N/A Tenderness on Yes N/A N/A Palpation: Wound Preparation: Ulcer Cleansing: N/A N/A Rinsed/Irrigated with Saline, Other: soap and water Topical Anesthetic Applied: Other: lidocaine 4% Assessment Notes: all measurements and N/A N/A everything the same this is for a skin graft check Treatment Notes Electronic Signature(s) Signed: 02/13/2017 1:06:32 PM By: Christin Fudge MD, FACS Entered By: Christin Fudge on 02/13/2017 13:06:32 Gasper, [name] ([id number]) -------------------------------------------------------------------------------- [place] Details Patient Name: [name]. Date of Service: 02/13/2017 12:30 PM Medical Record Number: [id number] Patient Account Number: [id number] Date of Birth/Sex: 1956-04-10 (61 y.o. Female) Treating RN: Carolyne Fiscal, Debi Primary Care Leshawn Straka: Einar Gip Other Clinician: Referring Jereline Ticer: Einar Gip Treating Saje Gallop/Extender: Frann Rider in Treatment: 12 Active Inactive ` Abuse / Safety / Falls / Self Care Management Nursing Diagnoses: Potential for falls Goals: Patient will remain injury free Date Initiated: 11/18/2016 Target Resolution Date: 01/20/2017 Goal Status: Active Interventions: Assess fall risk on admission and as needed Assess self care needs on admission and as needed Notes: ` Nutrition Nursing Diagnoses: Imbalanced nutrition Goals: Patient/caregiver agrees to and verbalizes understanding of need to use nutritional supplements and/or vitamins as prescribed Date Initiated: 11/18/2016 Target Resolution Date: 01/20/2017 Goal Status: Active Interventions: Assess patient nutrition upon admission and as needed per policy Notes: ` Orientation to the Wound Care Program Nursing Diagnoses: ANEIRA, CAVITT ([id number]) Knowledge deficit related to the wound healing center program Goals: Patient/caregiver will verbalize understanding of the [facility] Date Initiated: 11/18/2016 Target Resolution Date: 01/20/2017 Goal Status: Active Interventions: Provide education on orientation to the wound center Notes: ` Pain, Acute or Chronic Nursing Diagnoses: Pain, acute or chronic: actual or potential Potential alteration in comfort, pain Goals: Patient will verbalize adequate pain control and receive pain control interventions during procedures as needed Date Initiated: 11/18/2016 Target Resolution Date: 01/20/2017 Goal Status: Active Patient/caregiver will verbalize adequate pain control between visits Date Initiated: 11/18/2016 Target Resolution Date: 01/20/2017 Goal Status: Active Patient/caregiver will verbalize comfort level met Date Initiated: 11/18/2016 Target Resolution Date: 01/20/2017 Goal Status: Active Interventions: Assess comfort goal upon admission Complete pain assessment as per visit requirements Notes: ` Soft Tissue Infection Nursing Diagnoses: Impaired tissue integrity Knowledge deficit related to disease process and management Knowledge deficit related to home infection control: handwashing, handling of soiled dressings, supply storage Goals: LEANDRIA, THIER ([id number]) Patient/caregiver will verbalize understanding of or measures to prevent infection and contamination in the home setting Date Initiated: 11/18/2016 Target Resolution Date: 01/20/2017 Goal Status: Active Patient's soft tissue infection will resolve Date Initiated: 11/18/2016 Target Resolution Date: 01/20/2017 Goal Status: Active Interventions: Assess signs and symptoms of infection every visit Provide education on infection Treatment Activities: Education provided on Infection : 11/18/2016 Notes: ` Wound/Skin Impairment  Nursing Diagnoses: Impaired tissue integrity Goals: Ulcer/skin breakdown will have a volume reduction of 30% by week 4 Date Initiated: 11/18/2016 Target Resolution Date: 01/20/2017 Goal Status: Active Ulcer/skin breakdown  will have a volume reduction of 50% by week 8 Date Initiated: 11/18/2016 Target Resolution Date: 01/20/2017 Goal Status: Active Ulcer/skin breakdown will have a volume reduction of 80% by week 12 Date Initiated: 11/18/2016 Target Resolution Date: 01/20/2017 Goal Status: Active Interventions: Assess patient/caregiver ability to perform ulcer/skin care regimen upon admission and as needed Assess ulceration(s) every visit Provide education on smoking Notes: Electronic Signature(s) Signed: 02/13/2017 4:36:14 PM By: Alejandro Mulling Entered By: Alejandro Mulling on 02/13/2017 12:52:52 Remund, [name] ([id number]) -------------------------------------------------------------------------------- Pain Assessment Details Patient Name: [name]. Date of Service: 02/13/2017 12:30 PM Medical Record Number: [id number] Patient Account Number: [id number] Date of Birth/Sex: 20-Jan-1956 (61 y.o. Female) Treating RN: Ashok Cordia, Debi Primary Care Steadman Prosperi: Noemi Chapel Other Clinician: Referring Colbi Staubs: Noemi Chapel Treating Stephens Shreve/Extender: Rudene Re in Treatment: 12 Active Problems Location of Pain Severity and Description of Pain Patient Has Paino No Site Locations With Dressing Change: No Pain Management and Medication Current Pain Management: Electronic Signature(s) Signed: 02/13/2017 4:36:14 PM By: Alejandro Mulling Entered By: Alejandro Mulling on 02/13/2017 12:41:27 Medeiros, [name] ([id number]) -------------------------------------------------------------------------------- Patient/Caregiver Education Details Patient Name: [name]. Date of Service: 02/13/2017 12:30 PM Medical Record Number: [id number] Patient Account Number: [id number] Date of Birth/Gender: [birth year] (61 y.o. Female) Treating RN: Ashok Cordia, Debi Primary Care Physician: Noemi Chapel Other Clinician: Referring Physician: Noemi Chapel Treating Physician/Extender: Rudene Re in Treatment:  12 Education Assessment Education Provided To: Patient Education Topics Provided Wound/Skin Impairment: Handouts: Other: change dressing as ordered Methods: Demonstration, Explain/Verbal Responses: State content correctly Electronic Signature(s) Signed: 02/13/2017 4:36:14 PM By: Alejandro Mulling Entered By: Alejandro Mulling on 02/13/2017 14:18:46 Earhart, [name] ([id number]) -------------------------------------------------------------------------------- Wound Assessment Details Patient Name: [name]. Date of Service: 02/13/2017 12:30 PM Medical Record Number: [id number] Patient Account Number: [id number] Date of Birth/Sex: Aug 19, 1956 (61 y.o. Female) Treating RN: Ashok Cordia, Debi Primary Care Krystina Strieter: Noemi Chapel Other Clinician: Referring Shanvi Moyd: Noemi Chapel Treating Arda Keadle/Extender: Rudene Re in Treatment: 12 Wound Status Wound Number: 1 Primary Venous Leg Ulcer Etiology: Wound Location: Right Lower Leg - Anterior Wound Open Wounding Event: Gradually Appeared Status: Date Acquired: 05/19/2016 Comorbid Chronic Obstructive Pulmonary Weeks Of Treatment: 12 History: Disease (COPD), Hypertension, Clustered Wound: No Rheumatoid Arthritis Photos Photo Uploaded By: Alejandro Mulling on 02/13/2017 15:41:56 Wound Measurements Length: (cm) 2.5 Width: (cm) 2.6 Depth: (cm) 0.1 Area: (cm) 5.105 Volume: (cm) 0.511 % Reduction in Area: 71.5% % Reduction in Volume: 71.5% Epithelialization: Small (1-33%) Tunneling: No Wound Description Full Thickness Without Exposed Classification: Support Structures Wound Margin: Distinct, outline attached Exudate Large Amount: Exudate Type: Serosanguineous Exudate Color: red, brown Foul Odor After Cleansing: No Slough/Fibrino Yes Wound Bed Granulation Amount: Medium (34-66%) Exposed Structure Granulation Quality: Red, Hyper-granulation Fascia Exposed: No Adriano, Amee K. ([id number]) Necrotic Amount:  Small (1-33%) Fat Layer (Subcutaneous Tissue) Exposed: Yes Necrotic Quality: Adherent Slough Tendon Exposed: No Muscle Exposed: No Joint Exposed: No Bone Exposed: No Periwound Skin Texture Texture Color No Abnormalities Noted: No No Abnormalities Noted: No Excoriation: Yes Ecchymosis: Yes Scarring: Yes Erythema: Yes Erythema Location: Circumferential Moisture No Abnormalities Noted: No Temperature / Pain Temperature: No Abnormality Tenderness on Palpation: Yes Wound Preparation Ulcer Cleansing: Rinsed/Irrigated with Saline, Other: soap and water, Topical Anesthetic Applied: Other: lidocaine 4%, Assessment Notes all measurements and everything the same this is for a skin graft check  Treatment Notes Wound #1 (Right, Anterior Lower Leg) 1. Cleansed with: Clean wound with Normal Saline Cleanse wound with antibacterial soap and water 2. Anesthetic Topical Lidocaine 4% cream to wound bed prior to debridement 5. Secondary Dressing Applied ABD Pad Dry Gauze 7. Secured with 4-Layer Compression System - Right Lower Extremity Notes white dome paste anchor CHARCOAL, APLIGRAPH Electronic Signature(s) Signed: 02/13/2017 4:36:14 PM By: Alric Quan Entered By: Alric Quan on 02/13/2017 12:52:16 Ochsner, [name] ([id number]) -------------------------------------------------------------------------------- Vitals Details Patient Name: [name]. Date of Service: 02/13/2017 12:30 PM Medical Record Number: [id number] Patient Account Number: [id number] Date of Birth/Sex: 1956/01/21 (61 y.o. Female) Treating RN: Carolyne Fiscal, Debi Primary Care Brittiney Dicostanzo: Einar Gip Other Clinician: Referring Elta Angell: Einar Gip Treating Issachar Broady/Extender: Frann Rider in Treatment: 12 Vital Signs Time Taken: 12:41 Temperature (F): 97.9 Height (in): 63 Pulse (bpm): 86 Weight (lbs): 214.7 Respiratory Rate (breaths/min): 18 Body Mass Index (BMI): 38 Blood Pressure  (mmHg): 166/78 Reference Range: 80 - 120 mg / dl Electronic Signature(s) Signed: 02/13/2017 4:36:14 PM By: Alric Quan Entered By: Alric Quan on 02/13/2017 12:43:45

## 2017-02-18 ENCOUNTER — Ambulatory Visit (INDEPENDENT_AMBULATORY_CARE_PROVIDER_SITE_OTHER): Payer: BLUE CROSS/BLUE SHIELD | Admitting: Internal Medicine

## 2017-02-18 ENCOUNTER — Encounter: Payer: Self-pay | Admitting: Internal Medicine

## 2017-02-18 VITALS — BP 156/82 | HR 76 | Temp 97.9°F | Ht 62.0 in | Wt 237.2 lb

## 2017-02-18 DIAGNOSIS — M069 Rheumatoid arthritis, unspecified: Secondary | ICD-10-CM | POA: Diagnosis not present

## 2017-02-18 DIAGNOSIS — E038 Other specified hypothyroidism: Secondary | ICD-10-CM

## 2017-02-18 DIAGNOSIS — M170 Bilateral primary osteoarthritis of knee: Secondary | ICD-10-CM | POA: Diagnosis not present

## 2017-02-18 DIAGNOSIS — Z79899 Other long term (current) drug therapy: Secondary | ICD-10-CM

## 2017-02-18 DIAGNOSIS — I10 Essential (primary) hypertension: Secondary | ICD-10-CM

## 2017-02-18 DIAGNOSIS — F17210 Nicotine dependence, cigarettes, uncomplicated: Secondary | ICD-10-CM

## 2017-02-18 DIAGNOSIS — L97919 Non-pressure chronic ulcer of unspecified part of right lower leg with unspecified severity: Secondary | ICD-10-CM

## 2017-02-18 DIAGNOSIS — E063 Autoimmune thyroiditis: Secondary | ICD-10-CM

## 2017-02-18 DIAGNOSIS — I878 Other specified disorders of veins: Secondary | ICD-10-CM

## 2017-02-18 DIAGNOSIS — E785 Hyperlipidemia, unspecified: Secondary | ICD-10-CM | POA: Diagnosis not present

## 2017-02-18 DIAGNOSIS — E039 Hypothyroidism, unspecified: Secondary | ICD-10-CM | POA: Diagnosis not present

## 2017-02-18 DIAGNOSIS — I87391 Chronic venous hypertension (idiopathic) with other complications of right lower extremity: Secondary | ICD-10-CM | POA: Diagnosis not present

## 2017-02-18 MED ORDER — METHOTREXATE 2.5 MG PO TABS: ORAL_TABLET | ORAL | 0 refills | Status: DC

## 2017-02-18 MED ORDER — MELOXICAM 7.5 MG PO TABS
15.0000 mg | ORAL_TABLET | Freq: Every day | ORAL | 0 refills | Status: DC
Start: 2017-02-18 — End: 2017-03-14

## 2017-02-18 NOTE — Patient Instructions (Signed)
It was a pleasure seeing you again today! I'm sorry you are having knee pain and are nervous about your upcoming surgery.  1. Today we talked about your chronic arthritis pain in your knees. I reviewed your x-rays and you do have severe disease. There are many medications and injections we can try to help increase your functional status. Today I am going to start you on a medication called Meloxicam. You will take 15mg  of this medicine daily. It works best when taken consistently. This medication can cause acid reflux, so if this develops you may take a medication like Prilosec.  2. Today we also talked about your hypothyroidism. I am going to check your TSH today to make sure you are on the proper dose.  3. Today I have also refilled your Methotrexate. I will be getting some labs today as this medication requires monitoring.  4. Today we also talked about your Hypertension. I believe your elevated blood pressure the past 2 weeks is due to your stress about your upcoming surgery. We will re-evaluate this at your next appointment! 5. Good luck with your procedure!! Call the clinic if you need anything. Otherwise I will see you in 1-2 months.

## 2017-02-18 NOTE — Progress Notes (Signed)
   CC: follow up of HLD, RhA, hypothyroidism and OA  HPI:  [name] is a 61 y.o. F with medical history as outlined below who [facility] for follow-up of her chronic medical issues.   Severe Tricompartmental Osteoarthritis of BL Knees: Reports she is currently taking 10-12 Aleve a day in order to take the edge off her discomfort. Endorses occasional "catching" and "locking" and also states sometimes he knee will "give out." She has tried ibuprofen and tylenol without significant relief. Reports having several steroid injections a few years ago but didn't notice a significant difference. She has tried Ultram and Norco before which she states makes her pain tolerable and assists her to complete ADLs. Interested in being referred to pain clinic if necessary.  HTN: Has been adequately diet controlled for several months. BP today elevated however pt expresses anxiety about upcoming vascular procedure.   HLD: Taking Zocor 20 mg daily and is compliant with this. Lipid panel obtained at our last visit 11/2016 shows:   Ref. Range 11/15/2016 05:43  Total CHOL/HDL Ratio Latest Units: RATIO 4.4  Cholesterol Latest Ref Range: 0 - 200 mg/dL 606 (H)  HDL Cholesterol Latest Ref Range: >40 mg/dL 51  LDL (calc) Latest Ref Range: 0 - 99 mg/dL 301 (H)  Triglycerides Latest Ref Range: <150 mg/dL 601 (H)  VLDL Latest Ref Range: 0 - 40 mg/dL 31   Hypothyroidism: On synthroid 150 mcg daily. Most recent TSH 10/2016 was 99. Continues to endorse fatigue.   Rheumatoid Arthritis: On methotrexate 7.5 mg once weekly. Has been referred to rheumatology however initial appointment not scheduled until May of this year.   RLE ulcer 2/2 PVD: Sees wound care. Appears to be improving. Scheduled to have vascular surgery next week: laser ablation of her right great saphenous vein and stab phlebectomy of multiple tributary varicosities to reduce her venous hypertension   Past Medical History:  Diagnosis  Date  . Hashimoto's thyroiditis   . HTN (hypertension)   . Hypercholesteremia   . Hypothyroid   . Rheumatoid arthritis(714.0)    Review of Systems:  Review of Systems  Constitutional: Positive for chills and malaise/fatigue. Negative for fever and weight loss.  Respiratory: Negative for cough and wheezing.   Cardiovascular: Negative for chest pain, palpitations and leg swelling.  Gastrointestinal: Negative for abdominal pain, diarrhea, nausea and vomiting.  Musculoskeletal: Positive for back pain, falls and joint pain.  Neurological: Negative for tingling, sensory change and headaches.  Psychiatric/Behavioral: Negative for depression. The patient is nervous/anxious.    Physical Exam:  Vitals:   02/18/17 1442  BP: (!) 156/82  Pulse: 76  Temp: 97.9 F (36.6 C)  TempSrc: Oral  SpO2: 96%  Weight: 237 lb 3.2 oz (107.6 kg)  Height: 5\' 2"  (1.575 m)   Assessment & Plan:   See Encounters Tab for problem based charting.  Patient discussed with Dr. 

## 2017-02-19 DIAGNOSIS — M170 Bilateral primary osteoarthritis of knee: Secondary | ICD-10-CM | POA: Insufficient documentation

## 2017-02-19 LAB — CBC WITH DIFFERENTIAL/PLATELET
BASOS ABS: 0 10*3/uL (ref 0.0–0.2)
BASOS: 0 %
EOS (ABSOLUTE): 0.2 10*3/uL (ref 0.0–0.4)
Eos: 2 %
HEMOGLOBIN: 12.2 g/dL (ref 11.1–15.9)
Hematocrit: 39 % (ref 34.0–46.6)
IMMATURE GRANS (ABS): 0 10*3/uL (ref 0.0–0.1)
IMMATURE GRANULOCYTES: 1 %
LYMPHS: 17 %
Lymphocytes Absolute: 1.4 10*3/uL (ref 0.7–3.1)
MCH: 30.8 pg (ref 26.6–33.0)
MCHC: 31.3 g/dL — ABNORMAL LOW (ref 31.5–35.7)
MCV: 99 fL — ABNORMAL HIGH (ref 79–97)
MONOCYTES: 10 %
Monocytes Absolute: 0.8 10*3/uL (ref 0.1–0.9)
NEUTROS ABS: 5.8 10*3/uL (ref 1.4–7.0)
Neutrophils: 70 %
Platelets: 184 10*3/uL (ref 150–379)
RBC: 3.96 x10E6/uL (ref 3.77–5.28)
RDW: 14.4 % (ref 12.3–15.4)
WBC: 8.3 10*3/uL (ref 3.4–10.8)

## 2017-02-19 LAB — CMP14 + ANION GAP
ALT: 8 IU/L (ref 0–32)
AST: 12 IU/L (ref 0–40)
Albumin/Globulin Ratio: 1.5 (ref 1.2–2.2)
Albumin: 3.7 g/dL (ref 3.5–5.5)
Alkaline Phosphatase: 84 IU/L (ref 39–117)
Anion Gap: 19 mmol/L — ABNORMAL HIGH (ref 10.0–18.0)
BUN/Creatinine Ratio: 26 — ABNORMAL HIGH (ref 9–23)
BUN: 18 mg/dL (ref 6–24)
Bilirubin Total: <0.2 mg/dL (ref 0.0–1.2)
CALCIUM: 9 mg/dL (ref 8.7–10.2)
CHLORIDE: 100 mmol/L (ref 96–106)
CO2: 25 mmol/L (ref 18–29)
Creatinine, Ser: 0.69 mg/dL (ref 0.57–1.00)
GFR, EST AFRICAN AMERICAN: 110 mL/min/{1.73_m2} (ref >59)
GFR, EST NON AFRICAN AMERICAN: 96 mL/min/{1.73_m2} (ref >59)
GLUCOSE: 78 mg/dL (ref 65–99)
Globulin, Total: 2.4 g/dL (ref 1.5–4.5)
Potassium: 4.7 mmol/L (ref 3.5–5.2)
Sodium: 144 mmol/L (ref 134–144)
TOTAL PROTEIN: 6.1 g/dL (ref 6.0–8.5)

## 2017-02-19 LAB — TSH: TSH: 6.12 u[IU]/mL — ABNORMAL HIGH (ref 0.450–4.500)

## 2017-02-19 MED ORDER — LEVOTHYROXINE SODIUM 200 MCG PO TABS: 200.0000 ug | ORAL_TABLET | Freq: Every day | ORAL | 3 refills | Status: DC

## 2017-02-19 NOTE — Assessment & Plan Note (Signed)
She continues to endorse significant BL Knee pain. Worse in the mornings and after standing on her feet at work. Endorses stiffness in the mornings that wears off about 30 mins-1 hr after waking. Has received 1 steroid injection before without significant relief per patient. She has been on ultram and Norco before with increased functionality. Is open to pain clinic. Chart review shows that she has yet to be started on standard trials of NSAIDs for OA. Will start patient on Meloxicam today.  -Meloxicam 15 mg daily. Instructed to take daily for several days before interpreting effectiveness. Counseled on risk of GERD/PUD. Not to take with other NSAIDs -Consider Synvisc vs steroid injections in future - may be able to palliate  -Agreeable to pain clinic referral - consider if unable to re-mediate pain.

## 2017-02-19 NOTE — Assessment & Plan Note (Signed)
On Synthroid 150 mcg and reports daily compliance with this medication. Most recent TSH 10/2016 99! She had been of her Synthroid at that point for many years. Still continues to complain of fatigue. Wonder if she is on adequate replacement. Will obtain TSH today. -TSH = 6.1 today. Will call patient and request she increase her Synthroid to daily. -F/u TSH in 8 weeks

## 2017-02-19 NOTE — Progress Notes (Signed)
Case discussed with Dr. Vincente Liberty at the time of the visit.  We reviewed the resident's history and exam and pertinent patient test results.  I agree with the assessment, diagnosis and plan of care documented in the resident's note.  After review of the chart I believe the lower extremity ulcer is more likely related to chronic venous stasis rather than peripheral vascular disease.  This will be addressed with the upcoming venous surgery.  All that being said, she would benefit from a switch to a high intensity statin such as atorvastatin 40 mg daily at the follow-up visit given her cholesterol level on the modeate intensity statin and the fact that she has RA which puts her at risk for vascular disease.

## 2017-02-19 NOTE — Assessment & Plan Note (Signed)
Will undergo laser ablation of her right great saphenous vein and stab phlebectomy of multiple tributary varicosities to reduce her venous hypertension next week with [facility].

## 2017-02-19 NOTE — Assessment & Plan Note (Signed)
Joints most affected continue to be her BL elbows, wrists and knees however she complains of joint pain throughout. Was started on Methotrexate 7.5 mg Qweekly several months ago. Reports improvement of her pain since that time. Has been referred to rheumatology however appointment is not scheduled until May. Will check routine labs for Methotrexate therapy.  -CBC, CMET -Refill given of Methotrexate 7.5 mg Crissie Figures

## 2017-02-19 NOTE — Assessment & Plan Note (Signed)
Appears improved. She follows with wound care clinic for management of this.

## 2017-02-20 ENCOUNTER — Ambulatory Visit: Payer: BLUE CROSS/BLUE SHIELD | Admitting: Surgery

## 2017-02-20 ENCOUNTER — Other Ambulatory Visit: Payer: BLUE CROSS/BLUE SHIELD | Admitting: Vascular Surgery

## 2017-02-21 ENCOUNTER — Encounter: Payer: Self-pay | Admitting: Internal Medicine

## 2017-02-21 ENCOUNTER — Encounter: Payer: Self-pay | Admitting: Vascular Surgery

## 2017-02-24 ENCOUNTER — Encounter: Payer: BLUE CROSS/BLUE SHIELD | Admitting: Surgery

## 2017-02-24 DIAGNOSIS — I83012 Varicose veins of right lower extremity with ulcer of calf: Secondary | ICD-10-CM | POA: Diagnosis not present

## 2017-02-24 NOTE — Progress Notes (Signed)
MAXX, CALAWAY ([id number]) Visit Report for 02/24/2017 Chief Complaint Document Details Patient Name: [name], [name] 02/24/2017 12:30 Date of Service: PM Medical Record [id number] Number: Patient Account Number: [id number] 12-18-1955 (61 y.o. Treating RN: Phillis Haggis Date of Birth/Sex: Female) Other Clinician: Primary Care Provider: Noemi Chapel Treating Arietta Eisenstein Referring Provider: Noemi Chapel Provider/Extender: Weeks in Treatment: 14 Information Obtained from: Patient Chief Complaint Patient presents for treatment of an open ulcer due to venous insufficiency to her right lower extremity which she's had for over 6 months Electronic Signature(s) Signed: 02/24/2017 1:15:08 PM By: Evlyn Kanner MD, FACS Entered By: Evlyn Kanner on 02/24/2017 13:15:07 Weldin, [name] ([id number]) -------------------------------------------------------------------------------- HPI Details Patient Name: [name], [name] 02/24/2017 12:30 Date of Service: PM Medical Record [id number] Number: Patient Account Number: [id number] December 24, 1955 (60 y.o. Treating RN: Phillis Haggis Date of Birth/Sex: Female) Other Clinician: Primary Care Provider: Noemi Chapel Treating Hanin Decook Referring Provider: Noemi Chapel Provider/Extender: Weeks in Treatment: 14 History of Present Illness Location: ulcerated area on the right lower extremity Quality: Patient reports experiencing a sharp pain to affected area(s). Severity: Patient states wound are getting worse. Duration: Patient has had the wound for > 6 months prior to seeking treatment at the wound center Timing: Pain in wound is constant (hurts all the time) Context: The wound appeared gradually over time Modifying Factors: Other treatment(s) tried include:recent admission to hospital for a cellulitis and was treated with antibiotics and an Unna boot Associated Signs and Symptoms: Patient reports having increase swelling. HPI  Description: 61 year old patient who was recently admitted to the hospital between 11/14/2016 and 11/15/2016 for right lower extremity ulcer with cellulitis. she was asked to stop clindamycin and doxycycline and her prednisone and take Levaquin 500 mg daily by mouth. she was treated for a right lower extremity possibly due to venous stasis versus vascular insufficiency versus infection. She received IV vancomycin during her hospital stay. He is known to have rheumatoid arthritis and stopped taking her treatment because of lack of insurance. during her hospital stay vascular studies were done -- ABIs were 0.88 on the right and 1.1 on the left, she had triphasic flow on both lower extremities and a plain film of the right tibia and fibula showed no signs of osteomyelitis. She is not a diabetic. She is a smoker however and continued to smoke. lab work noted that her sedimentation rate was 29, CRP was less than 0.8, hemoglobin A1c was 5.2 12/02/2016 -- due to a family emergency and social economic reasons she has been unable to keep her appointment with the vascular department for her workup and is expecting insurance coverage after January 1 12/19/2016 -- she continues to smoke. As have new insurance and hopefully will get her vascular test done as soon as possible. 12/26/2016 -- she has quit smoking for the last 3 days and I have commended her. She did receive her appointment from [facility] [place] and it is later this month 01/13/2017 -- she has not been back to see [place] since January 11 and had her compression wrap on all these days almost 3 weeks. I have sternly cautioned her regarding this. Her appointment for a venous duplex study is next Thursday. 01/23/2017 ---- lower extremity venous duplex reflux evaluation shows small saphenous vein is competent but there is evidence of great saphenous vein reflux in the right lower extremity with no evidence of DVT or SVT. There is also deep  vein reflux in the right lower extremity. A vascular consult was  recommended. Note the left side was not studied. Lower extremity arterial evaluation was done -- unable to obtain right dorsalis pedis or posterior tibial Oatis, Veta K. ([id number]) pressures due to an open wound. Posterior tibial and dorsalis pedis waveform appears strong and triphasic. Right TBI was 0.88. Left ABI was 1.21. 01/30/2017 -- he has an appointment to see the vascular surgeons at [facility] this coming Thursday. her Apligraf has been authorized by her [employer] and we will get this for her for next week. 2/22 2018 -- she was seen by Dr. Tawanna Cooler Early this morning -- she was found to have severe CEAP class VI venous stasis disease and had recommended laser ablation of her right great saphenous vein and stab phlebectomy's of multiple tributary varicosities to recent venous hypertension and improve her wound healing and reduce the risk of recurrent ulceration. She has had her first application of Apligraf today. 02/13/2017 -- her surgery has been scheduled for next Thursday and hence we will not see her to the Monday after. He will be prepared to apply an Apligraf on that day 02/24/2017 -- her surgery last Thursday was canceled and it has been scheduled for next Thursday. Electronic Signature(s) Signed: 02/24/2017 1:15:53 PM By: Evlyn Kanner MD, FACS Entered By: Evlyn Kanner on 02/24/2017 13:15:53 Korpela, [name] ([id number]) -------------------------------------------------------------------------------- Physical Exam Details Patient Name: [name], [name] 02/24/2017 12:30 Date of Service: PM Medical Record [id number] Number: Patient Account Number: [id number] 03/21/1956 (60 y.o. Treating RN: Phillis Haggis Date of Birth/Sex: Female) Other Clinician: Primary Care Provider: Noemi Chapel Treating Evadene Wardrip Referring Provider: Noemi Chapel Provider/Extender: Weeks in Treatment:  14 Constitutional . Pulse regular. Respirations normal and unlabored. Afebrile. . Eyes Nonicteric. Reactive to light. Ears, Nose, Mouth, and Throat Lips, teeth, and gums WNL.Marland Kitchen Moist mucosa without lesions. Neck supple and nontender. No palpable supraclavicular or cervical adenopathy. Normal sized without goiter. Respiratory WNL. No retractions.. Breath sounds WNL, No rubs, rales, rhonchi, or wheeze.. Cardiovascular Heart rhythm and rate regular, no murmur or gallop.. Pedal Pulses WNL. No clubbing, cyanosis or edema. Chest Breasts symmetical and no nipple discharge.. Breast tissue WNL, no masses, lumps, or tenderness.. Lymphatic No adneopathy. No adenopathy. No adenopathy. Musculoskeletal Adexa without tenderness or enlargement.. Digits and nails w/o clubbing, cyanosis, infection, petechiae, ischemia, or inflammatory conditions.. Integumentary (Hair, Skin) No suspicious lesions. No crepitus or fluctuance. No peri-wound warmth or erythema. No masses.Marland Kitchen Psychiatric Judgement and insight Intact.. No evidence of depression, anxiety, or agitation.. Notes the wound has some hyper granulation tissue and some surrounding excoriation as her dressing was not changed for the last 12 days. No sharp debridement was required today. Electronic Signature(s) Signed: 02/24/2017 1:16:24 PM By: Evlyn Kanner MD, FACS Entered By: Evlyn Kanner on 02/24/2017 13:16:24 Balz, [name] ([id number]) -------------------------------------------------------------------------------- Physician Orders Details Patient Name: [name], [name] 02/24/2017 12:30 Date of Service: PM Medical Record [id number] Number: Patient Account Number: [id number] [birth year] (60 y.o. Treating RN: Curtis Sites Date of Birth/Sex: Female) Other Clinician: Primary Care Provider: Noemi Chapel Treating Jeyson Deshotel Referring Provider: Noemi Chapel Provider/Extender: Weeks in Treatment: 14 Verbal / Phone Orders: No Diagnosis  Coding Wound Cleansing Wound #1 Right,Anterior Lower Leg o Clean wound with Normal Saline. o Cleanse wound with mild soap and water Anesthetic Wound #1 Right,Anterior Lower Leg o Topical Lidocaine 4% cream applied to wound bed prior to debridement Skin Barriers/Peri-Wound Care Wound #1 Right,Anterior Lower Leg o Skin Prep Primary Wound Dressing Wound #1 Right,Anterior Lower Leg o Hydrafera Blue Secondary Dressing Wound #1 Right,Anterior  Lower Leg o ABD pad o Dry Gauze o Drawtex Dressing Change Frequency Wound #1 Right,Anterior Lower Leg o Change dressing every week Follow-up Appointments Wound #1 Right,Anterior Lower Leg o Return Appointment in 1 week. Edema Control Wound #1 Right,Anterior Lower Leg [name], [name] ([id number]) o 4-Layer Compression System - Right Lower Extremity - unna to anchor o Elevate legs to the level of the heart and pump ankles as often as possible Electronic Signature(s) Signed: 02/24/2017 2:21:07 PM By: Evlyn Kanner MD, FACS Signed: 02/24/2017 2:38:29 PM By: Curtis Sites Entered By: Curtis Sites on 02/24/2017 13:13:27 Lewan, [name] ([id number]) -------------------------------------------------------------------------------- Problem List Details Patient Name: [name], [name] 02/24/2017 12:30 Date of Service: PM Medical Record [id number] Number: Patient Account Number: [id number] 20-Jan-1956 (60 y.o. Treating RN: Phillis Haggis Date of Birth/Sex: Female) Other Clinician: Primary Care Provider: Noemi Chapel Treating Dona Walby Referring Provider: Noemi Chapel Provider/Extender: Weeks in Treatment: 14 Active Problems ICD-10 Encounter Code Description Active Date Diagnosis 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 layer 11/18/2016 Yes exposed I83.012 Varicose veins of right lower extremity with ulcer of calf 11/18/2016 Yes F17.218 Nicotine dependence, cigarettes, with other nicotine-  11/18/2016 Yes induced disorders M05.60 Rheumatoid arthritis of unspecified site with involvement 11/18/2016 Yes of other organs and systems Inactive Problems Resolved Problems Electronic Signature(s) Signed: 02/24/2017 1:14:54 PM By: Evlyn Kanner MD, FACS Entered By: Evlyn Kanner on 02/24/2017 13:14:54 Kingdon, [name] ([id number]) -------------------------------------------------------------------------------- Progress Note Details Patient Name: [name], [name] 02/24/2017 12:30 Date of Service: PM Medical Record [id number] Number: Patient Account Number: [id number] 11-May-1956 (60 y.o. Treating RN: Phillis Haggis Date of Birth/Sex: Female) Other Clinician: Primary Care Provider: Noemi Chapel Treating Hala Narula Referring Provider: Noemi Chapel Provider/Extender: Weeks in Treatment: 14 Subjective Chief Complaint Information obtained from Patient Patient presents for treatment of an open ulcer due to venous insufficiency to her right lower extremity which she's had for over 6 months History of Present Illness (HPI) The following HPI elements were documented for the patient's wound: Location: ulcerated area on the right lower extremity Quality: Patient reports experiencing a sharp pain to affected area(s). Severity: Patient states wound are getting worse. Duration: Patient has had the wound for > 6 months prior to seeking treatment at the wound center Timing: Pain in wound is constant (hurts all the time) Context: The wound appeared gradually over time Modifying Factors: Other treatment(s) tried include:recent admission to hospital for a cellulitis and was treated with antibiotics and an Unna boot Associated Signs and Symptoms: Patient reports having increase swelling. 61 year old patient who was recently admitted to the hospital between 11/14/2016 and 11/15/2016 for right lower extremity ulcer with cellulitis. she was asked to stop clindamycin and doxycycline and her  prednisone and take Levaquin 500 mg daily by mouth. she was treated for a right lower extremity possibly due to venous stasis versus vascular insufficiency versus infection. She received IV vancomycin during her hospital stay. He is known to have rheumatoid arthritis and stopped taking her treatment because of lack of insurance. during her hospital stay vascular studies were done -- ABIs were 0.88 on the right and 1.1 on the left, she had triphasic flow on both lower extremities and a plain film of the right tibia and fibula showed no signs of osteomyelitis. She is not a diabetic. She is a smoker however and continued to smoke. lab work noted that her sedimentation rate was 29, CRP was less than 0.8, hemoglobin A1c was 5.2 12/02/2016 -- due to a family emergency and  social economic reasons she has been unable to keep her appointment with the vascular department for her workup and is expecting insurance coverage after January 1 12/19/2016 -- she continues to smoke. As have new insurance and hopefully will get her vascular test done as soon as possible. 12/26/2016 -- she has quit smoking for the last 3 days and I have commended her. She did receive her appointment from [facility] [place] and it is later this month [name], [name] ([id number]) 01/13/2017 -- she has not been back to see [place] since January 11 and had her compression wrap on all these days almost 3 weeks. I have sternly cautioned her regarding this. Her appointment for a venous duplex study is next Thursday. 01/23/2017 ---- lower extremity venous duplex reflux evaluation shows small saphenous vein is competent but there is evidence of great saphenous vein reflux in the right lower extremity with no evidence of DVT or SVT. There is also deep vein reflux in the right lower extremity. A vascular consult was recommended. Note the left side was not studied. Lower extremity arterial evaluation was done -- unable to obtain right  dorsalis pedis or posterior tibial pressures due to an open wound. Posterior tibial and dorsalis pedis waveform appears strong and triphasic. Right TBI was 0.88. Left ABI was 1.21. 01/30/2017 -- he has an appointment to see the vascular surgeons at [facility] this coming Thursday. her Apligraf has been authorized by her [employer] and we will get this for her for next week. 2/22 2018 -- she was seen by Dr. Tawanna Cooler Early this morning -- she was found to have severe CEAP class VI venous stasis disease and had recommended laser ablation of her right great saphenous vein and stab phlebectomy's of multiple tributary varicosities to recent venous hypertension and improve her wound healing and reduce the risk of recurrent ulceration. She has had her first application of Apligraf today. 02/13/2017 -- her surgery has been scheduled for next Thursday and hence we will not see her to the Monday after. He will be prepared to apply an Apligraf on that day 02/24/2017 -- her surgery last Thursday was canceled and it has been scheduled for next Thursday. Objective Constitutional Pulse regular. Respirations normal and unlabored. Afebrile. Vitals Time Taken: 12:49 PM, Height: 63 in, Weight: 214.7 lbs, BMI: 38, Temperature: 98.1 F, Pulse: 72 bpm, Respiratory Rate: 16 breaths/min, Blood Pressure: 159/89 mmHg. Eyes Nonicteric. Reactive to light. Ears, Nose, Mouth, and Throat Lips, teeth, and gums WNL.Marland Kitchen Moist mucosa without lesions. Neck supple and nontender. No palpable supraclavicular or cervical adenopathy. Normal sized without goiter. Respiratory WNL. No retractions.. Breath sounds WNL, No rubs, rales, rhonchi, or wheeze.. Cardiovascular Dingus, Lamiah K. ([id number]) Heart rhythm and rate regular, no murmur or gallop.. Pedal Pulses WNL. No clubbing, cyanosis or edema. Chest Breasts symmetical and no nipple discharge.. Breast tissue WNL, no masses, lumps, or tenderness.. Lymphatic No adneopathy.  No adenopathy. No adenopathy. Musculoskeletal Adexa without tenderness or enlargement.. Digits and nails w/o clubbing, cyanosis, infection, petechiae, ischemia, or inflammatory conditions.Marland Kitchen Psychiatric Judgement and insight Intact.. No evidence of depression, anxiety, or agitation.. General Notes: the wound has some hyper granulation tissue and some surrounding excoriation as her dressing was not changed for the last 12 days. No sharp debridement was required today. Integumentary (Hair, Skin) No suspicious lesions. No crepitus or fluctuance. No peri-wound warmth or erythema. No masses.. Wound #1 status is Open. Original cause of wound was Gradually Appeared. The wound is located on the Right,Anterior Lower  Leg. The wound measures 4.5cm length x 4.3cm width x 0.1cm depth; 15.197cm^2 area and 1.52cm^3 volume. There is Fat Layer (Subcutaneous Tissue) Exposed exposed. There is no tunneling or undermining noted. There is a large amount of serosanguineous drainage noted. The wound margin is distinct with the outline attached to the wound base. There is medium (34-66%) red granulation within the wound bed. There is a small (1-33%) amount of necrotic tissue within the wound bed including Adherent Slough. The periwound skin appearance exhibited: Excoriation, Scarring, Ecchymosis, Erythema. The surrounding wound skin color is noted with erythema which is circumferential. Periwound temperature was noted as No Abnormality. The periwound has tenderness on palpation. Assessment Active Problems ICD-10 L97.212 - Non-pressure chronic ulcer of right calf with fat layer exposed I83.012 - Varicose veins of right lower extremity with ulcer of calf F17.218 - Nicotine dependence, cigarettes, with other nicotine-induced disorders M05.60 - Rheumatoid arthritis of unspecified site with involvement of other organs and systems [name], [name] ([id number]) Plan Wound Cleansing: Wound #1 Right,Anterior Lower  Leg: Clean wound with Normal Saline. Cleanse wound with mild soap and water Anesthetic: Wound #1 Right,Anterior Lower Leg: Topical Lidocaine 4% cream applied to wound bed prior to debridement Skin Barriers/Peri-Wound Care: Wound #1 Right,Anterior Lower Leg: Skin Prep Primary Wound Dressing: Wound #1 Right,Anterior Lower Leg: Hydrafera Blue Secondary Dressing: Wound #1 Right,Anterior Lower Leg: ABD pad Dry Gauze Drawtex Dressing Change Frequency: Wound #1 Right,Anterior Lower Leg: Change dressing every week Follow-up Appointments: Wound #1 Right,Anterior Lower Leg: Return Appointment in 1 week. Edema Control: Wound #1 Right,Anterior Lower Leg: 4-Layer Compression System - Right Lower Extremity - unna to anchor Elevate legs to the level of the heart and pump ankles as often as possible We will use Hydrofera Blue today and a 4-layer compression will be used. She is scheduled for surgery next Thursday and hence we will be ready for the next application of Apligraf on Monday, March 19 She is going to continue to be off smoking Electronic Signature(s) Signed: 02/24/2017 1:17:58 PM By: Evlyn Kanner MD, FACS Entered By: Evlyn Kanner on 02/24/2017 13:17:58 Monjaras, [name] ([id number]) Sibert, [name] ([id number]) -------------------------------------------------------------------------------- SuperBill Details Patient Name: [name]. Date of Service: 02/24/2017 Medical Record Number: [id number] Patient Account Number: [id number] Date of Birth/Sex: 11-Jan-1956 (61 y.o. Female) Treating RN: Ashok Cordia, Debi Primary Care Provider: Noemi Chapel Other Clinician: Referring Provider: Noemi Chapel Treating Provider/Extender: Evlyn Kanner Service Line: Outpatient Weeks in Treatment: 14 Diagnosis Coding ICD-10 Codes Code Description [phone] Non-pressure chronic ulcer of right calf with fat layer exposed I83.012 Varicose veins of right lower extremity with ulcer of  calf F17.218 Nicotine dependence, cigarettes, with other nicotine-induced disorders M05.60 Rheumatoid arthritis of unspecified site with involvement of other organs and systems Physician Procedures CPT4: Description Modifier Quantity Code [id number] - WC PHYS LEVEL 3 - EST PT 1 ICD-10 Description Diagnosis 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 layer exposed I83.012 Varicose veins of right lower extremity with ulcer of calf  M05.60 Rheumatoid arthritis of unspecified site with involvement of other organs and systems Electronic Signature(s) Signed: 02/24/2017 1:18:24 PM By: Evlyn Kanner MD, FACS Entered By: Evlyn Kanner on 02/24/2017 13:18:24

## 2017-02-24 NOTE — Progress Notes (Signed)
[name] ([id number]) Visit Report for 02/24/2017 Arrival Information Details Patient Name: [name], [name]. Date of Service: 02/24/2017 12:30 PM Medical Record Number: [id number] Patient Account Number: [id number] Date of Birth/Sex: 1956-10-17 (61 y.o. Female) Treating RN: Montey Hora Primary Care Evetta Renner: Einar Gip Other Clinician: Referring Ewart Carrera: Einar Gip Treating Jarrick Fjeld/Extender: Frann Rider in Treatment: 14 Visit Information History Since Last Visit Added or deleted any medications: No Patient Arrived: Ambulatory Any new allergies or adverse reactions: No Arrival Time: 12:47 Had a fall or experienced change in No Accompanied By: self activities of daily living that may affect Transfer Assistance: None risk of falls: Patient Identification Verified: Yes Signs or symptoms of abuse/neglect since last No Secondary Verification Process Yes visito Completed: Hospitalized since last visit: No Patient Requires Transmission- No Has Dressing in Place as Prescribed: Yes Based Precautions: Has Compression in Place as Prescribed: Yes Patient Has Alerts: Yes Pain Present Now: Yes Patient Alerts: L Leg ABI 1.2 R Leg ABI 0.88 ABIs done in [place] Signature(s) Signed: 02/24/2017 2:38:29 PM By: Montey Hora Entered By: Montey Hora on 02/24/2017 12:48:24 [name] ([id number]) -------------------------------------------------------------------------------- Encounter Discharge Information Details Patient Name: [name]. Date of Service: 02/24/2017 12:30 PM Medical Record Number: [id number] Patient Account Number: [id number] Date of Birth/Sex: 1956/07/29 (61 y.o. Female) Treating RN: Montey Hora Primary Care Carena Stream: Einar Gip Other Clinician: Referring Demetruis Depaul: Einar Gip Treating Kalena Mander/Extender: Frann Rider in Treatment: 14 Encounter Discharge Information Items Discharge Pain Level:  0 Discharge Condition: Stable Ambulatory Status: Ambulatory Discharge Destination: Home Transportation: Private Auto Accompanied By: self Schedule Follow-up Appointment: Yes Medication Reconciliation completed and provided to Patient/Care No [name]: Provided on Clinical Summary of Care: 02/24/2017 Form Type Recipient Paper Patient JP Electronic Signature(s) Signed: 02/24/2017 1:23:39 PM By: Ruthine Dose Entered By: Ruthine Dose on 02/24/2017 13:23:38 [name] ([id number]) -------------------------------------------------------------------------------- Lower Extremity Assessment Details Patient Name: [name]. Date of Service: 02/24/2017 12:30 PM Medical Record Number: [id number] Patient Account Number: [id number] Date of Birth/Sex: June 29, 1956 (61 y.o. Female) Treating RN: Montey Hora Primary Care Cassadee Vanzandt: Einar Gip Other Clinician: Referring Shawntay Prest: Einar Gip Treating Laszlo Ellerby/Extender: Frann Rider in Treatment: 14 Edema Assessment Assessed: [Left: No] [Right: No] E[Left: dema] [Right: :] Calf Left: Right: Point of Measurement: 32 cm From Medial Instep cm 44.2 cm Ankle Left: Right: Point of Measurement: 9 cm From Medial Instep cm 25.7 cm Vascular Assessment Pulses: Dorsalis Pedis Palpable: [Right:Yes] Posterior Tibial Extremity colors, hair growth, and conditions: Extremity Color: [Right:Hyperpigmented] Hair Growth on Extremity: [Right:No] Temperature of Extremity: [Right:Warm] Capillary Refill: [Right:< 3 seconds] Electronic Signature(s) Signed: 02/24/2017 2:38:29 PM By: Montey Hora Entered By: Montey Hora on 02/24/2017 13:04:22 [name] ([id number]) -------------------------------------------------------------------------------- Multi Wound Chart Details Patient Name: [name]. Date of Service: 02/24/2017 12:30 PM Medical Record Number: [id number] Patient Account Number: [id number] Date of Birth/Sex:  Sep 28, 1956 (61 y.o. Female) Treating RN: Montey Hora Primary Care Cyla Haluska: Einar Gip Other Clinician: Referring [name]: Einar Gip Treating Kin Galbraith/Extender: Frann Rider in Treatment: 14 Vital Signs Height(in): 63 Pulse(bpm): 72 Weight(lbs): 214.7 Blood Pressure 159/89 (mmHg): Body Mass Index(BMI): 38 Temperature(F): 98.1 Respiratory Rate 16 (breaths/min): Photos: [N/A:N/A] Wound Location: Right Lower Leg - Anterior N/A N/A Wounding Event: Gradually Appeared N/A N/A Primary Etiology: Venous Leg Ulcer N/A N/A Comorbid History: Chronic Obstructive N/A N/A Pulmonary Disease (COPD), Hypertension, Rheumatoid Arthritis Date Acquired: 05/19/2016 N/A N/A Weeks of Treatment: 14 N/A N/A Wound Status: Open N/A N/A Measurements L x W x D 4.5x4.3x0.1 N/A  N/A (cm) Area (cm) : 15.197 N/A N/A Volume (cm) : 1.52 N/A N/A % Reduction in Area: 15.10% N/A N/A % Reduction in Volume: 15.10% N/A N/A Classification: Full Thickness Without N/A N/A Exposed Support Structures Exudate Amount: Large N/A N/A Exudate Type: Serosanguineous N/A N/A Exudate Color: red, brown N/A N/A Wound Margin: Distinct, outline attached N/A N/A [name], [name] ([id number]) Granulation Amount: Medium (34-66%) N/A N/A Granulation Quality: Red, Hyper-granulation N/A N/A Necrotic Amount: Small (1-33%) N/A N/A Exposed Structures: Fat Layer (Subcutaneous N/A N/A Tissue) Exposed: Yes Fascia: No Tendon: No Muscle: No Joint: No Bone: No Epithelialization: Small (1-33%) N/A N/A Periwound Skin Texture: Excoriation: Yes N/A N/A Scarring: Yes Periwound Skin No Abnormalities Noted N/A N/A Moisture: Periwound Skin Color: Ecchymosis: Yes N/A N/A Erythema: Yes Erythema Location: Circumferential N/A N/A Temperature: No Abnormality N/A N/A Tenderness on Yes N/A N/A Palpation: Wound Preparation: Ulcer Cleansing: N/A N/A Rinsed/Irrigated with Saline, Other: soap and water Topical  Anesthetic Applied: Other: lidocaine 4% Treatment Notes Electronic Signature(s) Signed: 02/24/2017 1:15:00 PM By: Christin Fudge MD, FACS Entered By: Christin Fudge on 02/24/2017 13:14:59 [name], [name] ([id number]) -------------------------------------------------------------------------------- [place] Details Patient Name: [name], [name]. Date of Service: 02/24/2017 12:30 PM Medical Record Number: [id number] Patient Account Number: [id number] Date of Birth/Sex: 1956-10-24 (61 y.o. Female) Treating RN: Montey Hora Primary Care Necola Bluestein: Einar Gip Other Clinician: Referring Courtnee Myer: Einar Gip Treating Syenna Nazir/Extender: Frann Rider in Treatment: 14 Active Inactive ` Abuse / Safety / Falls / Self Care Management Nursing Diagnoses: Potential for falls Goals: Patient will remain injury free Date Initiated: 11/18/2016 Target Resolution Date: 01/20/2017 Goal Status: Active Interventions: Assess fall risk on admission and as needed Assess self care needs on admission and as needed Notes: ` Nutrition Nursing Diagnoses: Imbalanced nutrition Goals: Patient/caregiver agrees to and verbalizes understanding of need to use nutritional supplements and/or vitamins as prescribed Date Initiated: 11/18/2016 Target Resolution Date: 01/20/2017 Goal Status: Active Interventions: Assess patient nutrition upon admission and as needed per policy Notes: ` Orientation to the Wound Care Program Nursing Diagnoses: ARGUSTA, MCGANN ([id number]) Knowledge deficit related to the wound healing center program Goals: Patient/caregiver will verbalize understanding of the [place] Date Initiated: 11/18/2016 Target Resolution Date: 01/20/2017 Goal Status: Active Interventions: Provide education on orientation to the wound center Notes: ` Pain, Acute or Chronic Nursing Diagnoses: Pain, acute or chronic: actual or potential Potential  alteration in comfort, pain Goals: Patient will verbalize adequate pain control and receive pain control interventions during procedures as needed Date Initiated: 11/18/2016 Target Resolution Date: 01/20/2017 Goal Status: Active Patient/caregiver will verbalize adequate pain control between visits Date Initiated: 11/18/2016 Target Resolution Date: 01/20/2017 Goal Status: Active Patient/caregiver will verbalize comfort level met Date Initiated: 11/18/2016 Target Resolution Date: 01/20/2017 Goal Status: Active Interventions: Assess comfort goal upon admission Complete pain assessment as per visit requirements Notes: ` Soft Tissue Infection Nursing Diagnoses: Impaired tissue integrity Knowledge deficit related to disease process and management Knowledge deficit related to home infection control: handwashing, handling of soiled dressings, supply storage Goals: COLLYNS, MCQUIGG ([id number]) Patient/caregiver will verbalize understanding of or measures to prevent infection and contamination in the home setting Date Initiated: 11/18/2016 Target Resolution Date: 01/20/2017 Goal Status: Active Patient's soft tissue infection will resolve Date Initiated: 11/18/2016 Target Resolution Date: 01/20/2017 Goal Status: Active Interventions: Assess signs and symptoms of infection every visit Provide education on infection Treatment Activities: Education provided on Infection : 12/26/2016 Notes: ` Wound/Skin Impairment Nursing Diagnoses: Impaired tissue integrity Goals:  Ulcer/skin breakdown will have a volume reduction of 30% by week 4 Date Initiated: 11/18/2016 Target Resolution Date: 01/20/2017 Goal Status: Active Ulcer/skin breakdown will have a volume reduction of 50% by week 8 Date Initiated: 11/18/2016 Target Resolution Date: 01/20/2017 Goal Status: Active Ulcer/skin breakdown will have a volume reduction of 80% by week 12 Date Initiated: 11/18/2016 Target Resolution Date: 01/20/2017 Goal  Status: Active Interventions: Assess patient/caregiver ability to perform ulcer/skin care regimen upon admission and as needed Assess ulceration(s) every visit Provide education on smoking Notes: Electronic Signature(s) Signed: 02/24/2017 2:38:29 PM By: Montey Hora Entered By: Montey Hora on 02/24/2017 13:09:49 Casalino, [name] ([id number]) -------------------------------------------------------------------------------- Pain Assessment Details Patient Name: [name]. Date of Service: 02/24/2017 12:30 PM Medical Record Number: [id number] Patient Account Number: [id number] Date of Birth/Sex: January 19, 1956 (61 y.o. Female) Treating RN: Montey Hora Primary Care Maresha Anastos: Einar Gip Other Clinician: Referring Katrinka Herbison: Einar Gip Treating Diella Gillingham/Extender: Frann Rider in Treatment: 14 Active Problems Location of Pain Severity and Description of Pain Patient Has Paino Yes Site Locations Pain Location: Pain in Ulcers With Dressing Change: Yes Duration of the Pain. Constant / Intermittento Constant Pain Management and Medication Current Pain Management: Notes Topical or injectable lidocaine is offered to patient for acute pain when surgical debridement is performed. If needed, Patient is instructed to use over the counter pain medication for the following 24-48 hours after debridement. Wound care MDs do not prescribed pain medications. Patient has chronic pain or uncontrolled pain. Patient has been instructed to make an appointment with their Primary Care Physician for pain management. Electronic Signature(s) Signed: 02/24/2017 2:38:29 PM By: Montey Hora Entered By: Montey Hora on 02/24/2017 12:49:09 Skowronek, [name] ([id number]) -------------------------------------------------------------------------------- Patient/Caregiver Education Details Patient Name: [name] Date of Service: 02/24/2017 12:30 PM Medical Record Number:  [id number] Patient Account Number: [id number] Date of Birth/Gender: 1956/03/17 (61 y.o. Female) Treating RN: Montey Hora Primary Care Physician: Einar Gip Other Clinician: Referring Physician: Einar Gip Treating Physician/Extender: Frann Rider in Treatment: 14 Education Assessment Education Provided To: Patient Education Topics Provided Venous: Handouts: Other: come in for rewrap if needed Methods: Explain/Verbal Responses: State content correctly Electronic Signature(s) Signed: 02/24/2017 2:38:29 PM By: Montey Hora Entered By: Montey Hora on 02/24/2017 13:16:47 Kuenzi, [name] ([id number]) -------------------------------------------------------------------------------- Wound Assessment Details Patient Name: [name]. Date of Service: 02/24/2017 12:30 PM Medical Record Number: [id number] Patient Account Number: [id number] Date of Birth/Sex: 1956/08/01 (61 y.o. Female) Treating RN: Montey Hora Primary Care Omega Durante: Einar Gip Other Clinician: Referring Garner Dullea: Einar Gip Treating Tiawana Forgy/Extender: Frann Rider in Treatment: 14 Wound Status Wound Number: 1 Primary Venous Leg Ulcer Etiology: Wound Location: Right Lower Leg - Anterior Wound Open Wounding Event: Gradually Appeared Status: Date Acquired: 05/19/2016 Comorbid Chronic Obstructive Pulmonary Weeks Of Treatment: 14 History: Disease (COPD), Hypertension, Clustered Wound: No Rheumatoid Arthritis Photos Wound Measurements Length: (cm) 4.5 % Reduction in A Width: (cm) 4.3 % Reduction in V Depth: (cm) 0.1 Epithelializatio Area: (cm) 15.197 Tunneling: Volume: (cm) 1.52 Undermining: rea: 15.1% olume: 15.1% n: Small (1-33%) No No Wound Description Full Thickness Without Exposed Classification: Support Structures Wound Margin: Distinct, outline attached Exudate Large Amount: Exudate Type: Serosanguineous Exudate Color: red, brown Foul Odor After  Cleansing: No Slough/Fibrino Yes Wound Bed Granulation Amount: Medium (34-66%) Exposed Structure Granulation Quality: Red, Hyper-granulation Fascia Exposed: No Necrotic Amount: Small (1-33%) Fat Layer (Subcutaneous Tissue) Exposed: Yes Bonfiglio, Nariya K. ([id number]) Necrotic Quality: Adherent Slough Tendon Exposed: No Muscle Exposed: No Joint Exposed: No Bone Exposed: No  Periwound Skin Texture Texture Color No Abnormalities Noted: No No Abnormalities Noted: No Excoriation: Yes Ecchymosis: Yes Scarring: Yes Erythema: Yes Erythema Location: Circumferential Moisture No Abnormalities Noted: No Temperature / Pain Temperature: No Abnormality Tenderness on Palpation: Yes Wound Preparation Ulcer Cleansing: Rinsed/Irrigated with Saline, Other: soap and water, Topical Anesthetic Applied: Other: lidocaine 4%, Treatment Notes Wound #1 (Right, Anterior Lower Leg) 1. Cleansed with: Cleanse wound with antibacterial soap and water 2. Anesthetic Topical Lidocaine 4% cream to wound bed prior to debridement 3. Peri-wound Care: Barrier cream 4. Dressing Applied: Hydrafera Blue 5. Secondary Dressing Applied ABD Pad 7. Secured with 4-Layer Compression System - Right Lower Extremity Electronic Signature(s) Signed: 02/24/2017 2:38:29 PM By: Montey Hora Entered By: Montey Hora on 02/24/2017 13:10:32 Marquart, [name] ([id number]) -------------------------------------------------------------------------------- [place] Details Patient Name: [name]. Date of Service: 02/24/2017 12:30 PM Medical Record Number: [id number] Patient Account Number: [id number] Date of Birth/Sex: [birth year] (61 y.o. Female) Treating RN: Montey Hora Primary Care Felicie Kocher: Einar Gip Other Clinician: Referring Chastin Garlitz: Einar Gip Treating Matison Nuccio/Extender: Frann Rider in Treatment: 14 Vital Signs Time Taken: 12:49 Temperature (F): 98.1 Height (in): 63 Pulse (bpm): 72 Weight  (lbs): 214.7 Respiratory Rate (breaths/min): 16 Body Mass Index (BMI): 38 Blood Pressure (mmHg): 159/89 Reference Range: 80 - 120 mg / dl Electronic Signature(s) Signed: 02/24/2017 2:38:29 PM By: Montey Hora Entered By: Montey Hora on 02/24/2017 12:57:37

## 2017-02-27 ENCOUNTER — Encounter (HOSPITAL_COMMUNITY): Payer: BLUE CROSS/BLUE SHIELD

## 2017-02-27 ENCOUNTER — Ambulatory Visit: Payer: BLUE CROSS/BLUE SHIELD | Admitting: Vascular Surgery

## 2017-02-27 ENCOUNTER — Encounter: Payer: Self-pay | Admitting: Vascular Surgery

## 2017-02-27 ENCOUNTER — Ambulatory Visit (INDEPENDENT_AMBULATORY_CARE_PROVIDER_SITE_OTHER): Payer: Self-pay | Admitting: Vascular Surgery

## 2017-02-27 VITALS — BP 144/80 | HR 86 | Temp 97.8°F | Resp 24 | Ht 62.0 in | Wt 232.0 lb

## 2017-02-27 DIAGNOSIS — I868 Varicose veins of other specified sites: Secondary | ICD-10-CM

## 2017-02-27 DIAGNOSIS — I83893 Varicose veins of bilateral lower extremities with other complications: Secondary | ICD-10-CM

## 2017-02-27 NOTE — Progress Notes (Signed)
     Laser Ablation Procedure    Date: 02/27/2017   [name] DOB:1956/11/11  Consent signed: Yes    Surgeon:  Dr. Tawanna Cooler Early  Procedure: Laser Ablation: right Greater Saphenous Vein  BP (!) 144/80   Pulse 86   Temp 97.8 F (36.6 C)   Resp (!) 24   Ht 5\' 2"  (1.575 m)   Wt 232 lb (105.2 kg)   SpO2 96%   BMI 42.43 kg/m   Tumescent Anesthesia: 1300 cc 0.9% NaCl with 50 cc Lidocaine HCL with 1% Epi and 15 cc 8.4% NaHCO3  Local Anesthesia: 10 cc Lidocaine HCL and NaHCO3 (ratio 2:1)  15 watts continuous mode        Total energy: 1824   Total time: 2:01    Stab Phlebectomy: >20 Sites: Thigh  Patient tolerated procedure well  Notes:   Description of Procedure:  After marking the course of the secondary varicosities, the patient was placed on the operating table in the supine position, and the right leg was prepped and draped in sterile fashion.   Local anesthetic was administered and under ultrasound guidance the saphenous vein was accessed with a micro needle and guide wire; then the mirco puncture sheath was placed.  A guide wire was inserted saphenofemoral junction , followed by a 5 french sheath.  The position of the sheath and then the laser fiber below the junction was confirmed using the ultrasound.  Tumescent anesthesia was administered along the course of the saphenous vein using ultrasound guidance. The patient was placed in Trendelenburg position and protective laser glasses were placed on patient and staff, and the laser was fired at 15 watts continuous mode advancing 1-9mm/second for a total of 1824 joules.   For stab phlebectomies, local anesthetic was administered at the previously marked varicosities, and tumescent anesthesia was administered around the vessels.  Greater than 20 stab wounds were made using the tip of an 11 blade. And using the vein hook, the phlebectomies were performed using a hemostat to avulse the varicosities.  Adequate hemostasis was  achieved.     Steri strips were applied to the stab wounds and ABD pads and thigh high compression stockings were applied.  Ace wrap bandages were applied over the phlebectomy sites and at the top of the saphenofemoral junction. Blood loss was less than 15 cc.  The patient ambulated out of the operating room having tolerated the procedure well.

## 2017-02-28 ENCOUNTER — Telehealth: Payer: Self-pay | Admitting: *Deleted

## 2017-02-28 ENCOUNTER — Encounter: Payer: Self-pay | Admitting: Internal Medicine

## 2017-02-28 NOTE — Telephone Encounter (Signed)
Post laser ablation and [id number] phone call: Pt did have some bleeding from the phlebectomy procedure once she got home. Followed my instructions with elevation, pressure and application of ABD and ace wraps. Today, no more bleeding. She has some soreness at the upper thigh but not too bad. Her main complaint is the ace wrap around her knee is causing pain (she is unable to wear stockings). Her sone will help her re-wrap so hopefully this will take care of the discomfort. Pt is following all other instructions. We'll see her next Thurs at her fu appt.

## 2017-03-03 ENCOUNTER — Encounter: Payer: Self-pay | Admitting: Internal Medicine

## 2017-03-03 ENCOUNTER — Ambulatory Visit: Payer: BLUE CROSS/BLUE SHIELD | Admitting: Surgery

## 2017-03-04 ENCOUNTER — Encounter: Payer: BLUE CROSS/BLUE SHIELD | Admitting: Vascular Surgery

## 2017-03-04 ENCOUNTER — Encounter: Payer: Self-pay | Admitting: Internal Medicine

## 2017-03-06 ENCOUNTER — Ambulatory Visit (INDEPENDENT_AMBULATORY_CARE_PROVIDER_SITE_OTHER): Payer: Self-pay | Admitting: Vascular Surgery

## 2017-03-06 ENCOUNTER — Encounter: Payer: BLUE CROSS/BLUE SHIELD | Admitting: Surgery

## 2017-03-06 ENCOUNTER — Encounter: Payer: Self-pay | Admitting: Vascular Surgery

## 2017-03-06 ENCOUNTER — Encounter: Payer: Self-pay | Admitting: *Deleted

## 2017-03-06 ENCOUNTER — Ambulatory Visit (HOSPITAL_COMMUNITY)
Admission: RE | Admit: 2017-03-06 | Discharge: 2017-03-06 | Disposition: A | Discharge status: Home / Self Care | Payer: BLUE CROSS/BLUE SHIELD | Source: Ambulatory Visit | Attending: Vascular Surgery | Admitting: Vascular Surgery

## 2017-03-06 VITALS — BP 138/75 | HR 80 | Temp 97.5°F | Resp 18 | Ht 62.0 in | Wt 232.0 lb

## 2017-03-06 DIAGNOSIS — L97219 Non-pressure chronic ulcer of right calf with unspecified severity: Secondary | ICD-10-CM

## 2017-03-06 DIAGNOSIS — I83012 Varicose veins of right lower extremity with ulcer of calf: Secondary | ICD-10-CM | POA: Insufficient documentation

## 2017-03-06 DIAGNOSIS — I82811 Embolism and thrombosis of superficial veins of right lower extremities: Secondary | ICD-10-CM | POA: Diagnosis not present

## 2017-03-06 DIAGNOSIS — I83893 Varicose veins of bilateral lower extremities with other complications: Secondary | ICD-10-CM

## 2017-03-06 NOTE — Progress Notes (Signed)
   Patient name: [name] MRN: [id number] DOB: 01-Aug-1956 Sex: female  REASON FOR VISIT: One-week follow-up after laser ablation of right great saphenous vein and stab phlebectomy of multiple tributary varicosities  HPI: [name] is a 61 y.o. female here today for follow-up. She had much more than the typical amount of discomfort during the procedure. Very painful with the instillation of local anesthetic both for the ablation and for the phlebectomy. Continue to have a severe discomfort mainly in the ablation portion of her medial thigh following the procedure and still painful currently.  Current Outpatient Prescriptions  Medication Sig Dispense Refill  . diclofenac sodium (VOLTAREN) 1 % GEL Apply 2 g topically 4 (four) times daily. 1 Tube 2  . HYDROcodone-acetaminophen (NORCO/VICODIN) 5-325 MG tablet take 1 tablet by mouth every 12 hours if needed for severe pain  0  . Hydrocodone-Acetaminophen 5-300 MG TABS take 1 tablet by mouth three times a day with food if needed  0  . levothyroxine (SYNTHROID) 200 MCG tablet Take 1 tablet (200 mcg total) by mouth daily. 30 tablet 3  . levothyroxine (SYNTHROID) 200 MCG tablet Take 1 tablet (200 mcg total) by mouth daily. 30 tablet 3  . meloxicam (MOBIC) 7.5 MG tablet Take 2 tablets (15 mg total) by mouth daily. 60 tablet 0  . methotrexate (RHEUMATREX) 2.5 MG tablet Take 3 tablets (7.5 mg total) by mouth every 7 (seven) days. NO CHILD-PROOF BOTTLE CAPS PLEASE 12 tablet 1  . methotrexate (RHEUMATREX) 2.5 MG tablet take 3 tablets by mouth EVERY 7 DAYS 12 tablet 0  . pantoprazole (PROTONIX) 40 MG tablet take 2 tablet by mouth once daily 180 tablet 2  . simvastatin (ZOCOR) 20 MG tablet Take 1 tablet (20 mg total) by mouth daily at 6 PM. 30 tablet 2   No current facility-administered medications for this visit.      PHYSICAL EXAM: Vitals:   03/06/17 0924  BP: 138/75  Pulse: 80  Resp: 18  Temp: 97.5 F  (36.4 C)  SpO2: 92%  Weight: 232 lb (105.2 kg)  Height: 5\' 2"  (1.575 m)    GENERAL: The patient is a well-nourished female, in no acute distress. The vital signs are documented above. Typical bruising over the phlebectomy sites. She did have a very large varicosities over her anterior thigh and lateral calf. She's had a very nice result with the removal of these. There is some blood under the skin and I explained that this will completely resolve.  Duplex today shows thrombosis of her great saphenous vein and no evidence of DVT  MEDICAL ISSUES: She will continue her follow-up with the wound center regarding her venous ulcer in her right calf. Will continue to increase her mobility. Reassured her that the discomfort will continue to resolve. We'll see her again on an as-needed basis   , MD [facility] Tel [phone] Pager [phone]

## 2017-03-08 NOTE — Progress Notes (Signed)
BANESA, TRISTAN ([id number]) Visit Report for 03/06/2017 Arrival Information Details Patient Name: [name], [name]. Date of Service: 03/06/2017 1:30 PM Medical Record Number: [id number] Patient Account Number: [id number] Date of Birth/Sex: [birth year] (61 y.o. Female) Treating RN: Carolyne Fiscal, Debi Primary Care Gokul Waybright: Einar Gip Other Clinician: Referring Cathalina Barcia: Einar Gip Treating Kenisha Lynds/Extender: Frann Rider in Treatment: 15 Visit Information History Since Last Visit All ordered tests and consults were completed: No Patient Arrived: Ambulatory Added or deleted any medications: No Arrival Time: 13:39 Any new allergies or adverse reactions: No Accompanied By: self Had a fall or experienced change in No Transfer Assistance: None activities of daily living that may affect Patient Identification Verified: Yes risk of falls: Secondary Verification Process Yes Signs or symptoms of abuse/neglect since last No Completed: visito Patient Requires Transmission- No Hospitalized since last visit: No Based Precautions: Has Dressing in Place as Prescribed: Yes Patient Has Alerts: Yes Pain Present Now: No Patient Alerts: L Leg ABI 1.2 R Leg ABI 0.88 ABIs done in [place] Signature(s) Signed: 03/07/2017 4:34:11 PM By: Alric Quan Entered By: Alric Quan on 03/06/2017 13:40:58 Heinecke, [name] ([id number]) -------------------------------------------------------------------------------- Encounter Discharge Information Details Patient Name: [name]. Date of Service: 03/06/2017 1:30 PM Medical Record Number: [id number] Patient Account Number: [id number] Date of Birth/Sex: 04-Sep-1956 (61 y.o. Female) Treating RN: Carolyne Fiscal, Debi Primary Care Pablo Stauffer: Einar Gip Other Clinician: Referring Damyra Luscher: Einar Gip Treating Chonita Gadea/Extender: Frann Rider in Treatment: 15 Encounter Discharge Information Items Discharge Pain Level:  0 Discharge Condition: Stable Ambulatory Status: Ambulatory Discharge Destination: Home Transportation: Private Auto Accompanied By: self Schedule Follow-up Appointment: Yes Medication Reconciliation completed and provided to Patient/Care No Breckon Reeves: Provided on Clinical Summary of Care: 03/06/2017 Form Type Recipient Paper Patient JP Electronic Signature(s) Signed: 03/06/2017 2:43:07 PM By: Ruthine Dose Entered By: Ruthine Dose on 03/06/2017 14:43:07 Metzger, [name] ([id number]) -------------------------------------------------------------------------------- Lower Extremity Assessment Details Patient Name: [name]. Date of Service: 03/06/2017 1:30 PM Medical Record Number: [id number] Patient Account Number: [id number] Date of Birth/Sex: 08-18-1956 (61 y.o. Female) Treating RN: Carolyne Fiscal, Debi Primary Care Adrain Nesbit: Einar Gip Other Clinician: Referring Rees Matura: Einar Gip Treating Alyia Lacerte/Extender: Frann Rider in Treatment: 15 Edema Assessment Assessed: [Left: No] [Right: No] E[Left: dema] [Right: :] Calf Left: Right: Point of Measurement: 32 cm From Medial Instep cm 44.2 cm Ankle Left: Right: Point of Measurement: 9 cm From Medial Instep cm 25.6 cm Vascular Assessment Pulses: Dorsalis Pedis Palpable: [Right:Yes] Posterior Tibial Extremity colors, hair growth, and conditions: Extremity Color: [Right:Hyperpigmented] Temperature of Extremity: [Right:Warm] Capillary Refill: [Right:< 3 seconds] Electronic Signature(s) Signed: 03/07/2017 4:34:11 PM By: Alric Quan Entered By: Alric Quan on 03/06/2017 13:58:31 Edwards, [name] ([id number]) -------------------------------------------------------------------------------- Multi Wound Chart Details Patient Name: [name]. Date of Service: 03/06/2017 1:30 PM Medical Record Number: [id number] Patient Account Number: [id number] Date of Birth/Sex: November 19, 1956 (61 y.o.  Female) Treating RN: Carolyne Fiscal, Debi Primary Care Mathilda Maguire: Einar Gip Other Clinician: Referring Halla Chopp: Einar Gip Treating Wesam Gearhart/Extender: Frann Rider in Treatment: 15 Vital Signs Height(in): 63 Pulse(bpm): 70 Weight(lbs): 214.7 Blood Pressure 154/82 (mmHg): Body Mass Index(BMI): 38 Temperature(F): 98.0 Respiratory Rate 18 (breaths/min): Photos: [1:No Photos] [2:No Photos] [N/A:N/A] Wound Location: [1:Right Lower Leg - Anterior Right Knee - Posterior] [N/A:N/A] Wounding Event: [1:Gradually Appeared] [2:Shear/Friction] [N/A:N/A] Primary Etiology: [1:Venous Leg Ulcer] [2:Skin Tear] [N/A:N/A] Comorbid History: [1:Chronic Obstructive Pulmonary Disease (COPD), Hypertension, Rheumatoid Arthritis] [2:Chronic Obstructive Pulmonary Disease (COPD), Hypertension, Rheumatoid Arthritis] [N/A:N/A] Date Acquired: [1:05/19/2016] [2:03/06/2017] [N/A:N/A] Weeks of Treatment: [1:15] [2:0] [N/A:N/A] Wound Status: [  1:Open] [2:Open] [N/A:N/A] Measurements L x W x D 2x3x0.1 [2:1x0.7x0.1] [N/A:N/A] (cm) Area (cm) : [1:4.712] [2:0.55] [N/A:N/A] Volume (cm) : [1:0.471] [2:0.055] [N/A:N/A] % Reduction in Area: [1:73.70%] [2:N/A] [N/A:N/A] % Reduction in Volume: 73.70% [2:N/A] [N/A:N/A] Classification: [1:Full Thickness Without Exposed Support Structures] [2:Partial Thickness] [N/A:N/A] Exudate Amount: [1:Large] [2:Medium] [N/A:N/A] Exudate Type: [1:Serosanguineous] [2:Serosanguineous] [N/A:N/A] Exudate Color: [1:red, brown] [2:red, brown] [N/A:N/A] Wound Margin: [1:Distinct, outline attached Distinct, outline attached] [N/A:N/A] Granulation Amount: [1:Large (67-100%)] [2:Large (67-100%)] [N/A:N/A] Granulation Quality: [1:Red, Hyper-granulation] [2:Red] [N/A:N/A] Necrotic Amount: [1:Small (1-33%)] [2:None Present (0%)] [N/A:N/A] Exposed Structures: [1:Fat Layer (Subcutaneous Fascia: No Tissue) Exposed: Yes Fascia: No] [2:Fat Layer (Subcutaneous Tissue) Exposed: No]  [N/A:N/A] Tendon: No Tendon: No Muscle: No Muscle: No Joint: No Joint: No Bone: No Bone: No Limited to Skin Breakdown Epithelialization: Small (1-33%) None N/A Periwound Skin Texture: Excoriation: Yes No Abnormalities Noted N/A Scarring: Yes Periwound Skin No Abnormalities Noted No Abnormalities Noted N/A Moisture: Periwound Skin Color: Ecchymosis: Yes No Abnormalities Noted N/A Erythema: Yes Erythema Location: Circumferential N/A N/A Temperature: No Abnormality No Abnormality N/A Tenderness on Yes Yes N/A Palpation: Wound Preparation: Ulcer Cleansing: Ulcer Cleansing: N/A Rinsed/Irrigated with Rinsed/Irrigated with Saline, Other: soap and Saline water Topical Anesthetic Topical Anesthetic Applied: Other: lidocaine Applied: Other: lidocaine 4% 4% Procedures Performed: Cellular or Tissue Based N/A N/A Product Treatment Notes Wound #1 (Right, Anterior Lower Leg) 1. Cleansed with: Clean wound with Normal Saline Cleanse wound with antibacterial soap and water 2. Anesthetic Topical Lidocaine 4% cream to wound bed prior to debridement 3. Peri-wound Care: Skin Prep 4. Dressing Applied: Other dressing (specify in notes) 5. Secondary Dressing Applied ABD Pad Dry Gauze 7. Secured with 4-Layer Compression System - Right Lower Extremity Notes unna to anchor, charcoal, Apligraph, steri-strips, mepitel Wound #2 (Right, Posterior Knee) Quintanilla, [name] ([id number]) 1. Cleansed with: Clean wound with Normal Saline 2. Anesthetic Topical Lidocaine 4% cream to wound bed prior to debridement 3. Peri-wound Care: Skin Prep 4. Dressing Applied: Prisma Ag 5. Secondary Dressing Applied Dry Gauze Notes coverlet Electronic Signature(s) Signed: 03/06/2017 2:34:40 PM By: Christin Fudge MD, FACS Entered By: Christin Fudge on 03/06/2017 14:34:39 Cones, [name] ([id number]) -------------------------------------------------------------------------------- [place] Details Patient Name: AHNA, KONKLE. Date of Service: 03/06/2017 1:30 PM Medical Record Number: [id number] Patient Account Number: [id number] Date of Birth/Sex: 11-02-1956 (61 y.o. Female) Treating RN: Carolyne Fiscal, Debi Primary Care Ryna Beckstrom: Einar Gip Other Clinician: Referring Dealva Lafoy: Einar Gip Treating Anzley Dibbern/Extender: Frann Rider in Treatment: 15 Active Inactive ` Abuse / Safety / Falls / Self Care Management Nursing Diagnoses: Potential for falls Goals: Patient will remain injury free Date Initiated: 11/18/2016 Target Resolution Date: 01/20/2017 Goal Status: Active Interventions: Assess fall risk on admission and as needed Assess self care needs on admission and as needed Notes: ` Nutrition Nursing Diagnoses: Imbalanced nutrition Goals: Patient/caregiver agrees to and verbalizes understanding of need to use nutritional supplements and/or vitamins as prescribed Date Initiated: 11/18/2016 Target Resolution Date: 01/20/2017 Goal Status: Active Interventions: Assess patient nutrition upon admission and as needed per policy Notes: ` Orientation to the Wound Care Program Nursing Diagnoses: [name], [name] ([id number]) Knowledge deficit related to the wound healing center program Goals: Patient/caregiver will verbalize understanding of the [place] Date Initiated: 11/18/2016 Target Resolution Date: 01/20/2017 Goal Status: Active Interventions: Provide education on orientation to the wound center Notes: ` Pain, Acute or Chronic Nursing Diagnoses: Pain, acute or chronic: actual or potential Potential alteration in comfort, pain Goals: Patient will verbalize adequate  pain control and receive pain control interventions during procedures as needed Date Initiated: 11/18/2016 Target Resolution Date: 01/20/2017 Goal Status: Active Patient/caregiver will verbalize adequate pain control between visits Date Initiated:  11/18/2016 Target Resolution Date: 01/20/2017 Goal Status: Active Patient/caregiver will verbalize comfort level met Date Initiated: 11/18/2016 Target Resolution Date: 01/20/2017 Goal Status: Active Interventions: Assess comfort goal upon admission Complete pain assessment as per visit requirements Notes: ` Soft Tissue Infection Nursing Diagnoses: Impaired tissue integrity Knowledge deficit related to disease process and management Knowledge deficit related to home infection control: handwashing, handling of soiled dressings, supply storage Goals: [name], [name] ([id number]) Patient/caregiver will verbalize understanding of or measures to prevent infection and contamination in the home setting Date Initiated: 11/18/2016 Target Resolution Date: 01/20/2017 Goal Status: Active Patient's soft tissue infection will resolve Date Initiated: 11/18/2016 Target Resolution Date: 01/20/2017 Goal Status: Active Interventions: Assess signs and symptoms of infection every visit Provide education on infection Treatment Activities: Education provided on Infection : 11/18/2016 Notes: ` Wound/Skin Impairment Nursing Diagnoses: Impaired tissue integrity Goals: Ulcer/skin breakdown will have a volume reduction of 30% by week 4 Date Initiated: 11/18/2016 Target Resolution Date: 01/20/2017 Goal Status: Active Ulcer/skin breakdown will have a volume reduction of 50% by week 8 Date Initiated: 11/18/2016 Target Resolution Date: 01/20/2017 Goal Status: Active Ulcer/skin breakdown will have a volume reduction of 80% by week 12 Date Initiated: 11/18/2016 Target Resolution Date: 01/20/2017 Goal Status: Active Interventions: Assess patient/caregiver ability to perform ulcer/skin care regimen upon admission and as needed Assess ulceration(s) every visit Provide education on smoking Notes: Electronic Signature(s) Signed: 03/07/2017 4:34:11 PM By: Alric Quan Entered By: Alric Quan on 03/06/2017  14:01:56 Iqbal, [name] ([id number]) -------------------------------------------------------------------------------- Pain Assessment Details Patient Name: [name]. Date of Service: 03/06/2017 1:30 PM Medical Record Number: [id number] Patient Account Number: [id number] Date of Birth/Sex: 18-Jan-1956 (61 y.o. Female) Treating RN: Carolyne Fiscal, Debi Primary Care Earlene Bjelland: Einar Gip Other Clinician: Referring Carletta Feasel: Einar Gip Treating Kazuto Sevey/Extender: Frann Rider in Treatment: 15 Active Problems Location of Pain Severity and Description of Pain Patient Has Paino No Site Locations With Dressing Change: No Pain Management and Medication Current Pain Management: Electronic Signature(s) Signed: 03/07/2017 4:34:11 PM By: Alric Quan Entered By: Alric Quan on 03/06/2017 13:41:09 Lloyd, [name] ([id number]) -------------------------------------------------------------------------------- Patient/Caregiver Education Details Patient Name: [name]. Date of Service: 03/06/2017 1:30 PM Medical Record Number: [id number] Patient Account Number: [id number] Date of Birth/Gender: 01/10/1956 (61 y.o. Female) Treating RN: Carolyne Fiscal, Debi Primary Care Physician: Einar Gip Other Clinician: Referring Physician: Einar Gip Treating Physician/Extender: Frann Rider in Treatment: 15 Education Assessment Education Provided To: Patient Education Topics Provided Wound/Skin Impairment: Handouts: Other: change dressing as ordered Methods: Demonstration, Explain/Verbal Responses: State content correctly Electronic Signature(s) Signed: 03/07/2017 4:34:11 PM By: Alric Quan Entered By: Alric Quan on 03/06/2017 14:02:32 Etienne, [name] ([id number]) -------------------------------------------------------------------------------- Wound Assessment Details Patient Name: [name]. Date of Service: 03/06/2017 1:30 PM Medical  Record Number: [id number] Patient Account Number: [id number] Date of Birth/Sex: 1956-07-23 (61 y.o. Female) Treating RN: Carolyne Fiscal, Debi Primary Care Winfield Caba: Einar Gip Other Clinician: Referring Margaretmary [name]: Einar Gip Treating Reginia Battie/Extender: Frann Rider in Treatment: 15 Wound Status Wound Number: 1 Primary Venous Leg Ulcer Etiology: Wound Location: Right Lower Leg - Anterior Wound Open Wounding Event: Gradually Appeared Status: Date Acquired: 05/19/2016 Comorbid Chronic Obstructive Pulmonary Weeks Of Treatment: 15 History: Disease (COPD), Hypertension, Clustered Wound: No Rheumatoid Arthritis Photos Photo Uploaded By: Alric Quan on 03/06/2017 16:15:57 Wound Measurements Length: (cm) 2 Width: (cm)  3 Depth: (cm) 0.1 Area: (cm) 4.712 Volume: (cm) 0.471 % Reduction in Area: 73.7% % Reduction in Volume: 73.7% Epithelialization: Small (1-33%) Tunneling: No Undermining: No Wound Description Full Thickness Without Exposed Classification: Support Structures Wound Margin: Distinct, outline attached Exudate Large Amount: Exudate Type: Serosanguineous Exudate Color: red, brown Foul Odor After Cleansing: No Slough/Fibrino Yes Wound Bed Granulation Amount: Large (67-100%) Exposed Structure Granulation Quality: Red, Hyper-granulation Fascia Exposed: No Speece, Shalom K. ([id number]) Necrotic Amount: Small (1-33%) Fat Layer (Subcutaneous Tissue) Exposed: Yes Necrotic Quality: Adherent Slough Tendon Exposed: No Muscle Exposed: No Joint Exposed: No Bone Exposed: No Periwound Skin Texture Texture Color No Abnormalities Noted: No No Abnormalities Noted: No Excoriation: Yes Ecchymosis: Yes Scarring: Yes Erythema: Yes Erythema Location: Circumferential Moisture No Abnormalities Noted: No Temperature / Pain Temperature: No Abnormality Tenderness on Palpation: Yes Wound Preparation Ulcer Cleansing: Rinsed/Irrigated with Saline, Other: soap and  water, Topical Anesthetic Applied: Other: lidocaine 4%, Treatment Notes Wound #1 (Right, Anterior Lower Leg) 1. Cleansed with: Clean wound with Normal Saline Cleanse wound with antibacterial soap and water 2. Anesthetic Topical Lidocaine 4% cream to wound bed prior to debridement 3. Peri-wound Care: Skin Prep 4. Dressing Applied: Other dressing (specify in notes) 5. Secondary Dressing Applied ABD Pad Dry Gauze 7. Secured with 4-Layer Compression System - Right Lower Extremity Notes unna to anchor, charcoal, Apligraph, steri-strips, mepitel Electronic Signature(s) Signed: 03/07/2017 4:34:11 PM By: Alric Quan Entered By: Alric Quan on 03/06/2017 13:53:22 Hartman, [name] ([id number]) -------------------------------------------------------------------------------- Wound Assessment Details Patient Name: [name]. Date of Service: 03/06/2017 1:30 PM Medical Record Number: [id number] Patient Account Number: [id number] Date of Birth/Sex: Feb 21, 1956 (61 y.o. Female) Treating RN: Carolyne Fiscal, Debi Primary Care Arial Galligan: Einar Gip Other Clinician: Referring Ballard Budney: Einar Gip Treating Leocadio Heal/Extender: Frann Rider in Treatment: 15 Wound Status Wound Number: 2 Primary Skin Tear Etiology: Wound Location: Right Knee - Posterior Wound Open Wounding Event: Shear/Friction Status: Date Acquired: 03/06/2017 Comorbid Chronic Obstructive Pulmonary Weeks Of Treatment: 0 History: Disease (COPD), Hypertension, Clustered Wound: No Rheumatoid Arthritis Photos Photo Uploaded By: Alric Quan on 03/06/2017 16:16:18 Wound Measurements Length: (cm) 1 Width: (cm) 0.7 Depth: (cm) 0.1 Area: (cm) 0.55 Volume: (cm) 0.055 % Reduction in Area: % Reduction in Volume: Epithelialization: None Tunneling: No Undermining: No Wound Description Classification: Partial Thickness Foul Odor Af Wound Margin: Distinct, outline attached Slough/Fibri Exudate  Amount: Medium Exudate Type: Serosanguineous Exudate Color: red, brown ter Cleansing: No no No Wound Bed Granulation Amount: Large (67-100%) Exposed Structure Granulation Quality: Red Fascia Exposed: No Necrotic Amount: None Present (0%) Fat Layer (Subcutaneous Tissue) Exposed: No Tendon Exposed: No Gayler, Tamieka K. ([id number]) Muscle Exposed: No Joint Exposed: No Bone Exposed: No Limited to Skin Breakdown Periwound Skin Texture Texture Color No Abnormalities Noted: No No Abnormalities Noted: No Moisture Temperature / Pain No Abnormalities Noted: No Temperature: No Abnormality Tenderness on Palpation: Yes Wound Preparation Ulcer Cleansing: Rinsed/Irrigated with Saline Topical Anesthetic Applied: Other: lidocaine 4%, Treatment Notes Wound #2 (Right, Posterior Knee) 1. Cleansed with: Clean wound with Normal Saline 2. Anesthetic Topical Lidocaine 4% cream to wound bed prior to debridement 3. Peri-wound Care: Skin Prep 4. Dressing Applied: Prisma Ag 5. Secondary Dressing Applied Dry Gauze Notes coverlet Electronic Signature(s) Signed: 03/07/2017 4:34:11 PM By: Alric Quan Entered By: Alric Quan on 03/06/2017 13:57:38 Daino, [name] ([id number]) -------------------------------------------------------------------------------- [place] Details Patient Name: [name] Date of Service: 03/06/2017 1:30 PM Medical Record Number: [id number] Patient Account Number: [id number] Date of Birth/Sex: June 03, 1956 (61 y.o. Female) Treating RN:  Carolyne Fiscal, Debi Primary Care Haiden Rawlinson: Einar Gip Other Clinician: Referring Yanel Dombrosky: Einar Gip Treating Lannette Avellino/Extender: Frann Rider in Treatment: 15 Vital Signs Time Taken: 13:42 Temperature (F): 98.0 Height (in): 63 Pulse (bpm): 70 Weight (lbs): 214.7 Respiratory Rate (breaths/min): 18 Body Mass Index (BMI): 38 Blood Pressure (mmHg): 154/82 Reference Range: 80 - 120 mg / dl Electronic  Signature(s) Signed: 03/07/2017 4:34:11 PM By: Alric Quan Entered By: Alric Quan on 03/06/2017 14:01:47

## 2017-03-08 NOTE — Progress Notes (Signed)
JEWELDEAN, DROHAN ([id number]) Visit Report for 03/06/2017 Chief Complaint Document Details Patient Name: [name], [name]. Date of Service: 03/06/2017 1:30 PM Medical Record Number: [id number] Patient Account Number: [id number] Date of Birth/Sex: August 08, 1956 (61 y.o. Female) Treating RN: Ashok Cordia, Debi Primary Care Provider: Noemi Chapel Other Clinician: Referring Provider: Noemi Chapel Treating Provider/Extender: Rudene Re in Treatment: 15 Information Obtained from: Patient Chief Complaint Patient presents for treatment of an open ulcer due to venous insufficiency to her right lower extremity which she's had for over 6 months Electronic Signature(s) Signed: 03/06/2017 2:34:53 PM By: Evlyn Kanner MD, FACS Entered By: Evlyn Kanner on 03/06/2017 14:34:53 [name], [name] ([id number]) -------------------------------------------------------------------------------- Cellular or Tissue Based Product Details Patient Name: Marilyn Mins. Date of Service: 03/06/2017 1:30 PM Medical Record Number: [id number] Patient Account Number: [id number] Date of Birth/Sex: 09-26-1956 (61 y.o. Female) Treating RN: Ashok Cordia, Debi Primary Care Provider: Noemi Chapel Other Clinician: Referring Provider: Noemi Chapel Treating Provider/Extender: Rudene Re in Treatment: 15 Cellular or Tissue Based Wound #1 Right,Anterior Lower Leg Product Type Applied to: Performed By: Physician Evlyn Kanner, MD Cellular or Tissue Based Apligraf Product Type: Pre-procedure Yes - 14:11 Verification/Time Out Taken: Location: trunk / arms / legs Wound Size (sq cm): 6 Product Size (sq cm): 44 Waste Size (sq cm): 38 Waste Reason: wound size Amount of Product Applied (sq cm): 6 Lot #: [id number] Expiration Date: 03/13/2017 Fenestrated: Yes Instrument: Blade Reconstituted: Yes Solution Type: NORMAL SALINE Solution Amount: Lot #: [id number] Solution Expiration 11/15/2018 Date: Secured:  Yes Secured With: Steri-Strips Dressing Applied: Yes Primary Dressing: MEPITEL Procedural Pain: 0 Post Procedural Pain: 0 Response to Treatment: Procedure was tolerated well Post Procedure Diagnosis Same as Pre-procedure Electronic Signature(s) Signed: 03/06/2017 2:34:47 PM By: Evlyn Kanner MD, FACS [name], [name] ([id number]) Entered By: Evlyn Kanner on 03/06/2017 14:34:47 [name], [name] ([id number]) -------------------------------------------------------------------------------- HPI Details Patient Name: Marilyn Mins. Date of Service: 03/06/2017 1:30 PM Medical Record Number: [id number] Patient Account Number: [id number] Date of Birth/Sex: Dec 29, 1955 (61 y.o. Female) Treating RN: Ashok Cordia, Debi Primary Care Provider: Noemi Chapel Other Clinician: Referring Provider: Noemi Chapel Treating Provider/Extender: Rudene Re in Treatment: 15 History of Present Illness Location: ulcerated area on the right lower extremity Quality: Patient reports experiencing a sharp pain to affected area(s). Severity: Patient states wound are getting worse. Duration: Patient has had the wound for > 6 months prior to seeking treatment at the wound center Timing: Pain in wound is constant (hurts all the time) Context: The wound appeared gradually over time Modifying Factors: Other treatment(s) tried include:recent admission to hospital for a cellulitis and was treated with antibiotics and an Unna boot Associated Signs and Symptoms: Patient reports having increase swelling. HPI Description: 61 year old patient who was recently admitted to the hospital between 11/14/2016 and 11/15/2016 for right lower extremity ulcer with cellulitis. she was asked to stop clindamycin and doxycycline and her prednisone and take Levaquin 500 mg daily by mouth. she was treated for a right lower extremity possibly due to venous stasis versus vascular insufficiency versus infection. She received IV  vancomycin during her hospital stay. He is known to have rheumatoid arthritis and stopped taking her treatment because of lack of insurance. during her hospital stay vascular studies were done -- ABIs were 0.88 on the right and 1.1 on the left, she had triphasic flow on both lower extremities and a plain film of the right tibia and fibula showed no signs of osteomyelitis. She is not a diabetic. She is a  smoker however and continued to smoke. lab work noted that her sedimentation rate was 29, CRP was less than 0.8, hemoglobin A1c was 5.2 12/02/2016 -- due to a family emergency and social economic reasons she has been unable to keep her appointment with the vascular department for her workup and is expecting insurance coverage after January 1 12/19/2016 -- she continues to smoke. As have new insurance and hopefully will get her vascular test done as soon as possible. 12/26/2016 -- she has quit smoking for the last 3 days and I have commended her. She did receive her appointment from [facility] and it is later this month 01/13/2017 -- she has not been back to see [place] since January 11 and had her compression wrap on all these days almost 3 weeks. I have sternly cautioned her regarding this. Her appointment for a venous duplex study is next Thursday. 01/23/2017 ---- lower extremity venous duplex reflux evaluation shows small saphenous vein is competent but there is evidence of great saphenous vein reflux in the right lower extremity with no evidence of DVT or SVT. There is also deep vein reflux in the right lower extremity. A vascular consult was recommended. Note the left side was not studied. Lower extremity arterial evaluation was done -- unable to obtain right dorsalis pedis or posterior tibial pressures due to an open wound. Posterior tibial and dorsalis pedis waveform appears strong and triphasic. Right TBI was 0.88. Left ABI was 1.21. [name], [name]  ([id number]) 01/30/2017 -- he has an appointment to see the vascular surgeons at [facility] this coming Thursday. her Apligraf has been authorized by her [employer] and we will get this for her for next week. 2/22 2018 -- she was seen by Dr. Tawanna Cooler Early this morning -- she was found to have severe CEAP class VI venous stasis disease and had recommended laser ablation of her right great saphenous vein and stab phlebectomy's of multiple tributary varicosities to recent venous hypertension and improve her wound healing and reduce the risk of recurrent ulceration. She has had her first application of Apligraf today. 02/13/2017 -- her surgery has been scheduled for next Thursday and hence we will not see her to the Monday after. He will be prepared to apply an Apligraf on that day 02/24/2017 -- her surgery last Thursday was canceled and it has been scheduled for next Thursday. 03/06/2017 -- was seen today by Dr. Tawanna Cooler Early who reviewed a duplex which shows thrombosis of the great saphenous vein with no evidence of DVT -- He recommended she should continue with the wound clinic and would see her back on an as-needed basis. For various reasons the patient has not been here regularly, as been a month sincee her last application of Apligraf. Today she is had a second application of the Apligraf. Electronic Signature(s) Signed: 03/06/2017 2:37:59 PM By: Evlyn Kanner MD, FACS Entered By: Evlyn Kanner on 03/06/2017 14:37:59 [name], [name] ([id number]) -------------------------------------------------------------------------------- Physical Exam Details Patient Name: [name], EGGLETON. Date of Service: 03/06/2017 1:30 PM Medical Record Number: [id number] Patient Account Number: [id number] Date of Birth/Sex: July 09, 1956 (61 y.o. Female) Treating RN: Ashok Cordia, Debi Primary Care Provider: Noemi Chapel Other Clinician: Referring Provider: Noemi Chapel Treating Provider/Extender: Rudene Re in Treatment: 15 Constitutional . Pulse regular. Respirations normal and unlabored. Afebrile. . Eyes Nonicteric. Reactive to light. Ears, Nose, Mouth, and Throat Lips, teeth, and gums WNL.Marland Kitchen Moist mucosa without lesions. Neck supple and nontender. No palpable supraclavicular or cervical adenopathy. Normal sized  without goiter. Respiratory WNL. No retractions.. Breath sounds WNL, No rubs, rales, rhonchi, or wheeze.. Cardiovascular Heart rhythm and rate regular, no murmur or gallop.. Pedal Pulses WNL. No clubbing, cyanosis or edema. Lymphatic No adneopathy. No adenopathy. No adenopathy. Musculoskeletal Adexa without tenderness or enlargement.. Digits and nails w/o clubbing, cyanosis, infection, petechiae, ischemia, or inflammatory conditions.. Integumentary (Hair, Skin) No suspicious lesions. No crepitus or fluctuance. No peri-wound warmth or erythema. No masses.Marland Kitchen Psychiatric Judgement and insight Intact.. No evidence of depression, anxiety, or agitation.. Notes the wound has been prepared appropriately and the Apligraf was bolstered in place and a 4-layer compression wrap was applied over this. Electronic Signature(s) Signed: 03/06/2017 2:38:36 PM By: Evlyn Kanner MD, FACS Entered By: Evlyn Kanner on 03/06/2017 14:38:35 [name], [name] ([id number]) -------------------------------------------------------------------------------- Physician Orders Details Patient Name: MICHAYLA, [name]. Date of Service: 03/06/2017 1:30 PM Medical Record Number: [id number] Patient Account Number: [id number] Date of Birth/Sex: [birth year] (61 y.o. Female) Treating RN: Ashok Cordia, Debi Primary Care Provider: Noemi Chapel Other Clinician: Referring Provider: Noemi Chapel Treating Provider/Extender: Rudene Re in Treatment: 15 Verbal / Phone Orders: Yes Clinician: Ashok Cordia, Debi Read Back and Verified: Yes Diagnosis Coding Wound Cleansing Wound #1 Right,Anterior Lower  Leg o Clean wound with Normal Saline. o Cleanse wound with mild soap and water Wound #2 Right,Posterior Knee o Clean wound with Normal Saline. o Cleanse wound with mild soap and water Anesthetic Wound #1 Right,Anterior Lower Leg o Topical Lidocaine 4% cream applied to wound bed prior to debridement Wound #2 Right,Posterior Knee o Topical Lidocaine 4% cream applied to wound bed prior to debridement Skin Barriers/Peri-Wound Care Wound #1 Right,Anterior Lower Leg o Skin Prep Wound #2 Right,Posterior Knee o Skin Prep Primary Wound Dressing Wound #1 Right,Anterior Lower Leg o Other: - Apligraph Wound #2 Right,Posterior Knee o Prisma Ag Secondary Dressing Wound #1 Right,Anterior Lower Leg o ABD pad o Dry Gauze o Other - charcoal, mepitel, steri-strips [name], [name] ([id number]) Wound #2 Right,Posterior Knee o Dry Gauze o Other - coverlet Dressing Change Frequency Wound #1 Right,Anterior Lower Leg o Change dressing every week Wound #2 Right,Posterior Knee o Change dressing every other day. Follow-up Appointments Wound #1 Right,Anterior Lower Leg o Return Appointment in 1 week. o Nurse Visit as needed - next week Wound #2 Right,Posterior Knee o Return Appointment in 1 week. o Nurse Visit as needed - next week Edema Control Wound #1 Right,Anterior Lower Leg o 4-Layer Compression System - Right Lower Extremity - unna to anchor o Elevate legs to the level of the heart and pump ankles as often as possible Electronic Signature(s) Signed: 03/06/2017 4:00:38 PM By: Evlyn Kanner MD, FACS Signed: 03/07/2017 4:34:11 PM By: Alejandro Mulling Entered By: Alejandro Mulling on 03/06/2017 14:20:48 [name], [name] ([id number]) -------------------------------------------------------------------------------- Problem List Details Patient Name: MIKKI, ZIFF. Date of Service: 03/06/2017 1:30 PM Medical Record Number: [id number] Patient  Account Number: [id number] Date of Birth/Sex: 1956/12/15 (61 y.o. 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/18/2016 Yes exposed I83.012 Varicose veins of right lower extremity with ulcer of calf 11/18/2016 Yes F17.218 Nicotine dependence, cigarettes, with other nicotine- 11/18/2016 Yes induced disorders M05.60 Rheumatoid arthritis of unspecified site with involvement 11/18/2016 Yes of other organs and systems Inactive Problems Resolved Problems Electronic Signature(s) Signed: 03/06/2017 2:34:31 PM By: Evlyn Kanner MD,  FACS Entered By: Evlyn Kanner on 03/06/2017 14:34:31 [name], [name] ([id number]) -------------------------------------------------------------------------------- Progress Note Details Patient Name: [name], NETH. Date of Service: 03/06/2017 1:30 PM Medical Record Number: [id number] Patient Account Number: [id number] Date of Birth/Sex: 26-Feb-1956 (61 y.o. Female) Treating RN: Ashok Cordia, Debi Primary Care Provider: Noemi Chapel Other Clinician: Referring Provider: Noemi Chapel Treating Provider/Extender: Rudene Re in Treatment: 15 Subjective Chief Complaint Information obtained from Patient Patient presents for treatment of an open ulcer due to venous insufficiency to her right lower extremity which she's had for over 6 months History of Present Illness (HPI) The following HPI elements were documented for the patient's wound: Location: ulcerated area on the right lower extremity Quality: Patient reports experiencing a sharp pain to affected area(s). Severity: Patient states wound are getting worse. Duration: Patient has had the wound for > 6 months prior to seeking  treatment at the wound center Timing: Pain in wound is constant (hurts all the time) Context: The wound appeared gradually over time Modifying Factors: Other treatment(s) tried include:recent admission to hospital for a cellulitis and was treated with antibiotics and an Unna boot Associated Signs and Symptoms: Patient reports having increase swelling. 61 year old patient who was recently admitted to the hospital between 11/14/2016 and 11/15/2016 for right lower extremity ulcer with cellulitis. she was asked to stop clindamycin and doxycycline and her prednisone and take Levaquin 500 mg daily by mouth. she was treated for a right lower extremity possibly due to venous stasis versus vascular insufficiency versus infection. She received IV vancomycin during her hospital stay. He is known to have rheumatoid arthritis and stopped taking her treatment because of lack of insurance. during her hospital stay vascular studies were done -- ABIs were 0.88 on the right and 1.1 on the left, she had triphasic flow on both lower extremities and a plain film of the right tibia and fibula showed no signs of osteomyelitis. She is not a diabetic. She is a smoker however and continued to smoke. lab work noted that her sedimentation rate was 29, CRP was less than 0.8, hemoglobin A1c was 5.2 12/02/2016 -- due to a family emergency and social economic reasons she has been unable to keep her appointment with the vascular department for her workup and is expecting insurance coverage after January 1 12/19/2016 -- she continues to smoke. As have new insurance and hopefully will get her vascular test done as soon as possible. 12/26/2016 -- she has quit smoking for the last 3 days and I have commended her. She did receive her appointment from vein and vascular  and it is later this month 01/13/2017 -- she has not been back to see [place] since January 11 and had her compression wrap on all these days almost 3 weeks.  I have sternly cautioned her regarding this. Her appointment for a venous DESHANDA, MOLITOR. ([id number]) duplex study is next Thursday. 01/23/2017 ---- lower extremity venous duplex reflux evaluation shows small saphenous vein is competent but there is evidence of great saphenous vein reflux in the right lower extremity with no evidence of DVT or SVT. There is also deep vein reflux in the right lower extremity. A vascular consult was recommended. Note the left side was not studied. Lower extremity arterial evaluation was done -- unable to obtain right dorsalis pedis or posterior tibial pressures due to an open wound. Posterior tibial and dorsalis pedis waveform appears strong and triphasic. Right TBI was 0.88. Left ABI was 1.21. 01/30/2017 -- he has an appointment to see  the vascular surgeons at [facility] this coming Thursday. her Apligraf has been authorized by her [employer] and we will get this for her for next week. 2/22 2018 -- she was seen by Dr. Tawanna Cooler Early this morning -- she was found to have severe CEAP class VI venous stasis disease and had recommended laser ablation of her right great saphenous vein and stab phlebectomy's of multiple tributary varicosities to recent venous hypertension and improve her wound healing and reduce the risk of recurrent ulceration. She has had her first application of Apligraf today. 02/13/2017 -- her surgery has been scheduled for next Thursday and hence we will not see her to the Monday after. He will be prepared to apply an Apligraf on that day 02/24/2017 -- her surgery last Thursday was canceled and it has been scheduled for next Thursday. 03/06/2017 -- was seen today by Dr. Tawanna Cooler Early who reviewed a duplex which shows thrombosis of the great saphenous vein with no evidence of DVT -- He recommended she should continue with the wound clinic and would see her back on an as-needed basis. For various reasons the patient has not been here regularly,  as been a month sincee her last application of Apligraf. Today she is had a second application of the Apligraf. Objective Constitutional Pulse regular. Respirations normal and unlabored. Afebrile. Vitals Time Taken: 1:42 PM, Height: 63 in, Weight: 214.7 lbs, BMI: 38, Temperature: 98.0 F, Pulse: 70 bpm, Respiratory Rate: 18 breaths/min, Blood Pressure: 154/82 mmHg. Eyes Nonicteric. Reactive to light. Ears, Nose, Mouth, and Throat Lips, teeth, and gums WNL.Marland Kitchen Moist mucosa without lesions. NEALIE, MCHATTON ([id number]) Neck supple and nontender. No palpable supraclavicular or cervical adenopathy. Normal sized without goiter. Respiratory WNL. No retractions.. Breath sounds WNL, No rubs, rales, rhonchi, or wheeze.. Cardiovascular Heart rhythm and rate regular, no murmur or gallop.. Pedal Pulses WNL. No clubbing, cyanosis or edema. Lymphatic No adneopathy. No adenopathy. No adenopathy. Musculoskeletal Adexa without tenderness or enlargement.. Digits and nails w/o clubbing, cyanosis, infection, petechiae, ischemia, or inflammatory conditions.Marland Kitchen Psychiatric Judgement and insight Intact.. No evidence of depression, anxiety, or agitation.. General Notes: the wound has been prepared appropriately and the Apligraf was bolstered in place and a 4-layer compression wrap was applied over this. Integumentary (Hair, Skin) No suspicious lesions. No crepitus or fluctuance. No peri-wound warmth or erythema. No masses.. Wound #1 status is Open. Original cause of wound was Gradually Appeared. The wound is located on the Right,Anterior Lower Leg. The wound measures 2cm length x 3cm width x 0.1cm depth; 4.712cm^2 area and 0.471cm^3 volume. There is Fat Layer (Subcutaneous Tissue) Exposed exposed. There is no tunneling or undermining noted. There is a large amount of serosanguineous drainage noted. The wound margin is distinct with the outline attached to the wound base. There is large (67-100%) red  granulation within the wound bed. There is a small (1-33%) amount of necrotic tissue within the wound bed including Adherent Slough. The periwound skin appearance exhibited: Excoriation, Scarring, Ecchymosis, Erythema. The surrounding wound skin color is noted with erythema which is circumferential. Periwound temperature was noted as No Abnormality. The periwound has tenderness on palpation. Wound #2 status is Open. Original cause of wound was Shear/Friction. The wound is located on the Right,Posterior Knee. The wound measures 1cm length x 0.7cm width x 0.1cm depth; 0.55cm^2 area and 0.055cm^3 volume. The wound is limited to skin breakdown. There is no tunneling or undermining noted. There is a medium amount of serosanguineous drainage noted. The wound margin is distinct with  the outline attached to the wound base. There is large (67-100%) red granulation within the wound bed. There is no necrotic tissue within the wound bed. Periwound temperature was noted as No Abnormality. The periwound has tenderness on palpation. Assessment TYONNA, MINETTI ([id number]) Active Problems ICD-10 [phone] - Non-pressure chronic ulcer of right calf with fat layer exposed I83.012 - Varicose veins of right lower extremity with ulcer of calf F17.218 - Nicotine dependence, cigarettes, with other nicotine-induced disorders M05.60 - Rheumatoid arthritis of unspecified site with involvement of other organs and systems Procedures Wound #1 Wound #1 is a Venous Leg Ulcer located on the Right,Anterior Lower Leg. A skin graft procedure using a bioengineered skin substitute/cellular or tissue based product was performed by Evlyn Kanner, MD. Apligraf was applied and secured with Steri-Strips. 6 sq cm of product was utilized and 38 sq cm was wasted due to wound size. Post Application, MEPITEL was applied. A Time Out was conducted at 14:11, prior to the start of the procedure. The procedure was tolerated well with a pain  level of 0 throughout and a pain level of 0 following the procedure. Post procedure Diagnosis Wound #1: Same as Pre-Procedure . Plan Wound Cleansing: Wound #1 Right,Anterior Lower Leg: Clean wound with Normal Saline. Cleanse wound with mild soap and water Wound #2 Right,Posterior Knee: Clean wound with Normal Saline. Cleanse wound with mild soap and water Anesthetic: Wound #1 Right,Anterior Lower Leg: Topical Lidocaine 4% cream applied to wound bed prior to debridement Wound #2 Right,Posterior Knee: Topical Lidocaine 4% cream applied to wound bed prior to debridement Skin Barriers/Peri-Wound Care: Wound #1 Right,Anterior Lower Leg: Skin Prep Wound #2 Right,Posterior Knee: Skin Prep Primary Wound Dressing: Wound #1 Right,Anterior Lower Leg: JESSAH, LUYSTER ([id number]) Other: - Apligraph Wound #2 Right,Posterior Knee: Prisma Ag Secondary Dressing: Wound #1 Right,Anterior Lower Leg: ABD pad Dry Gauze Other - charcoal, mepitel, steri-strips Wound #2 Right,Posterior Knee: Dry Gauze Other - coverlet Dressing Change Frequency: Wound #1 Right,Anterior Lower Leg: Change dressing every week Wound #2 Right,Posterior Knee: Change dressing every other day. Follow-up Appointments: Wound #1 Right,Anterior Lower Leg: Return Appointment in 1 week. Nurse Visit as needed - next week Wound #2 Right,Posterior Knee: Return Appointment in 1 week. Nurse Visit as needed - next week Edema Control: Wound #1 Right,Anterior Lower Leg: 4-Layer Compression System - Right Lower Extremity - unna to anchor Elevate legs to the level of the heart and pump ankles as often as possible The wound base is clean and she has had her second application of Apligraf today which has been bolstered in place and a 4-layer compression will be used. her endovenous ablation has been successful and I have reviewed these reports from her surgeon. She is going to continue to be off smoking Electronic  Signature(s) Signed: 03/06/2017 2:39:41 PM By: Evlyn Kanner MD, FACS Entered By: Evlyn Kanner on 03/06/2017 14:39:41 Rosenkranz, [name] ([id number]) -------------------------------------------------------------------------------- SuperBill Details Patient Name: Marilyn Mins. Date of Service: 03/06/2017 Medical Record Number: [id number] Patient Account Number: [id number] Date of Birth/Sex: 1956-05-15 (61 y.o. Female) Treating RN: Ashok Cordia, Debi Primary Care Provider: Noemi Chapel Other Clinician: Referring Provider: Noemi Chapel Treating Provider/Extender: Evlyn Kanner Service Line: Outpatient Weeks in Treatment: 15 Diagnosis Coding ICD-10 Codes Code Description [id number] Non-pressure chronic ulcer of right calf with fat layer exposed I83.012 Varicose veins of right lower extremity with ulcer of calf F17.218 Nicotine dependence, cigarettes, with other nicotine-induced disorders M05.60 Rheumatoid arthritis of unspecified site with involvement of other organs and systems Facility  Procedures CPT4 Code: 16109604 Description: [phone] (Facility Use Only) Apligraf 44 SQ CM Modifier: Quantity: 44 CPT4 Code: 11914782 Description: 15271 - SKIN SUB GRAFT TRNK/ARM/LEG ICD-10 Description Diagnosis 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 I83.012 Varicose veins of right lower extremity with ulcer Modifier: layer exposed of calf Quantity: 1 Physician Procedures CPT4 Code: 9562130 Description: 99213 - WC PHYS LEVEL 3 - EST PT ICD-10 Description Diagnosis 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

## 2017-03-12 ENCOUNTER — Ambulatory Visit (INDEPENDENT_AMBULATORY_CARE_PROVIDER_SITE_OTHER): Payer: BLUE CROSS/BLUE SHIELD | Admitting: Internal Medicine

## 2017-03-12 ENCOUNTER — Other Ambulatory Visit: Payer: Self-pay | Admitting: Internal Medicine

## 2017-03-12 VITALS — BP 142/72 | HR 85 | Temp 98.4°F | Wt 241.7 lb

## 2017-03-12 DIAGNOSIS — M069 Rheumatoid arthritis, unspecified: Secondary | ICD-10-CM

## 2017-03-12 DIAGNOSIS — M06861 Other specified rheumatoid arthritis, right knee: Secondary | ICD-10-CM | POA: Diagnosis not present

## 2017-03-12 DIAGNOSIS — M06862 Other specified rheumatoid arthritis, left knee: Secondary | ICD-10-CM

## 2017-03-12 MED ORDER — METHOTREXATE (ANTI-RHEUMATIC) 2.5 MG PO TABS: 7.5000 mg | ORAL_TABLET | ORAL | 0 refills | Status: DC

## 2017-03-12 NOTE — Progress Notes (Signed)
   CC: Knee pain   HPI:  [name] is a 61 y.o. female with past medical history outlined below here for follow up of her chronic bilateral knee pain. For the details of today's visit, please refer to the assessment and plan.  Past Medical History:  Diagnosis Date  . Hashimoto's thyroiditis   . HTN (hypertension)   . Hypercholesteremia   . Hypothyroid   . Rheumatoid arthritis(714.0)     Review of Systems:  All pertinents listed in HPI, otherwise negative  Physical Exam:  Vitals:   03/12/17 1401  BP: (!) 142/72  Pulse: 85  Temp: 98.4 F (36.9 C)  TempSrc: Oral  SpO2: 95%  Weight: 241 lb 11.2 oz (109.6 kg)    Constitutional: NAD, appears comfortable Cardiovascular: RRR Pulmonary/Chest: CTAB Extremities: Warm and well perfused.  No edema.  Neurological: A&Ox3, CN II - XII grossly intact.   Assessment & Plan:   See Encounters Tab for problem based charting.  Patient discussed with Dr. Oswaldo Done

## 2017-03-12 NOTE — Patient Instructions (Addendum)
Ms. Finamore,  It was a pleasure to see you today. I am sorry to hear about your knee pain. I have placed a referral to a pain clinic, they will call you to schedule an appointment. In the meantime, please continue to take your methotrexate and meloxicam. Please follow up with your PCP for your scheduled appointment in May. If you have any questions or concerns, call our clinic at [phone] or after hours call [phone] and ask for the internal medicine resident on call. Thank you!  - Dr. Antony Contras

## 2017-03-12 NOTE — Assessment & Plan Note (Signed)
Patient is here for follow-up of her chronic rheumatoid arthritis pain. She is complaining of severe pain in her bilateral knees. Patient works as a Conservation officer, nature at [employer] and is on her feet for extended periods of time. She was started on meloxicam 15 mg daily at her prior visit 2 weeks ago. Patient reports compliance with this medication but has not noticed any therapeutic benefit. She has tried steroid knee injection the past without relief. Patient declined knee injections today. She is requesting referral to pain clinic. She has her initial consultation appointment with rheumatology scheduled in May. She is currently on methotrexate 7.5 mg weekly. Follow-up with PCP as scheduled 05-06-17. -- Pain clinic referral -- F/u with Rheumatology and PCP for scheduled appointments in May  -- Continue methotrexate 7.5 qweekly; refill provided

## 2017-03-13 ENCOUNTER — Encounter: Payer: BLUE CROSS/BLUE SHIELD | Admitting: Surgery

## 2017-03-13 DIAGNOSIS — I83012 Varicose veins of right lower extremity with ulcer of calf: Secondary | ICD-10-CM | POA: Diagnosis not present

## 2017-03-13 NOTE — Progress Notes (Signed)
Internal Medicine Clinic Attending  Case discussed with Dr. Guilloud at the time of the visit.  We reviewed the resident's history and exam and pertinent patient test results.  I agree with the assessment, diagnosis, and plan of care documented in the resident's note.  

## 2017-03-14 ENCOUNTER — Encounter: Payer: Self-pay | Admitting: Internal Medicine

## 2017-03-14 ENCOUNTER — Other Ambulatory Visit: Payer: Self-pay | Admitting: Internal Medicine

## 2017-03-14 NOTE — Progress Notes (Addendum)
KALSEY, SMUCKER ([id number]) Visit Report for 03/13/2017 Chief Complaint Document Details Patient Name: [name], [name] 03/13/2017 12:30 Date of Service: PM Medical Record [id number] Number: Patient Account Number: [id number] 12-18-1955 (61 y.o. Treating RN: Phillis Haggis Date of Birth/Sex: Female) Other Clinician: Primary Care Provider: Noemi Chapel Treating Tiwanna Tuch Referring Provider: Noemi Chapel Provider/Extender: Weeks in Treatment: 16 Information Obtained from: Patient Chief Complaint Patient presents for treatment of an open ulcer due to venous insufficiency to her right lower extremity which she's had for over 6 months Electronic Signature(s) Signed: 03/13/2017 1:03:11 PM By: Evlyn Kanner MD, FACS Entered By: Evlyn Kanner on 03/13/2017 13:03:11 Sautter, [name] ([id number]) -------------------------------------------------------------------------------- HPI Details Patient Name: Marilyn, [name] 03/13/2017 12:30 Date of Service: PM Medical Record [id number] Number: Patient Account Number: [id number] 1956/03/31 (60 y.o. Treating RN: Phillis Haggis Date of Birth/Sex: Female) Other Clinician: Primary Care Provider: Noemi Chapel Treating Jannis Atkins Referring Provider: Noemi Chapel Provider/Extender: Weeks in Treatment: 16 History of Present Illness Location: ulcerated area on the right lower extremity Quality: Patient reports experiencing a sharp pain to affected area(s). Severity: Patient states wound are getting worse. Duration: Patient has had the wound for > 6 months prior to seeking treatment at the wound center Timing: Pain in wound is constant (hurts all the time) Context: The wound appeared gradually over time Modifying Factors: Other treatment(s) tried include:recent admission to hospital for a cellulitis and was treated with antibiotics and an Unna boot Associated Signs and Symptoms: Patient reports having increase swelling. HPI  Description: 61 year old patient who was recently admitted to the hospital between 11/14/2016 and 11/15/2016 for right lower extremity ulcer with cellulitis. she was asked to stop clindamycin and doxycycline and her prednisone and take Levaquin 500 mg daily by mouth. she was treated for a right lower extremity possibly due to venous stasis versus vascular insufficiency versus infection. She received IV vancomycin during her hospital stay. He is known to have rheumatoid arthritis and stopped taking her treatment because of lack of insurance. during her hospital stay vascular studies were done -- ABIs were 0.88 on the right and 1.1 on the left, she had triphasic flow on both lower extremities and a plain film of the right tibia and fibula showed no signs of osteomyelitis. She is not a diabetic. She is a smoker however and continued to smoke. lab work noted that her sedimentation rate was 29, CRP was less than 0.8, hemoglobin A1c was 5.2 12/02/2016 -- due to a family emergency and social economic reasons she has been unable to keep her appointment with the vascular department for her workup and is expecting insurance coverage after January 1 12/19/2016 -- she continues to smoke. As have new insurance and hopefully will get her vascular test done as soon as possible. 12/26/2016 -- she has quit smoking for the last 3 days and I have commended her. She did receive her appointment from [facility] [place] and it is later this month 01/13/2017 -- she has not been back to see [place] since January 11 and had her compression wrap on all these days almost 3 weeks. I have sternly cautioned her regarding this. Her appointment for a venous duplex study is next Thursday. 01/23/2017 ---- lower extremity venous duplex reflux evaluation shows small saphenous vein is competent but there is evidence of great saphenous vein reflux in the right lower extremity with no evidence of DVT or SVT. There is also deep  vein reflux in the right lower extremity. A vascular consult was  recommended. Note the left side was not studied. Lower extremity arterial evaluation was done -- unable to obtain right dorsalis pedis or posterior tibial Yamaguchi, Faduma K. ([id number]) pressures due to an open wound. Posterior tibial and dorsalis pedis waveform appears strong and triphasic. Right TBI was 0.88. Left ABI was 1.21. 01/30/2017 -- he has an appointment to see the vascular surgeons at [facility] [address] this coming Thursday. her Apligraf has been authorized by her [employer] and we will get this for her for next week. 2/22 2018 -- she was seen by Dr. Tawanna Cooler Early this morning -- she was found to have severe CEAP class VI venous stasis disease and had recommended laser ablation of her right great saphenous vein and stab phlebectomy's of multiple tributary varicosities to recent venous hypertension and improve her wound healing and reduce the risk of recurrent ulceration. She has had her first application of Apligraf today. 02/13/2017 -- her surgery has been scheduled for next Thursday and hence we will not see her to the Monday after. He will be prepared to apply an Apligraf on that day 02/24/2017 -- her surgery last Thursday was canceled and it has been scheduled for next Thursday. 03/06/2017 -- was seen today by Dr. Tawanna Cooler Early who reviewed a duplex which shows thrombosis of the great saphenous vein with no evidence of DVT -- He recommended she should continue with the wound clinic and would see her back on an as-needed basis. For various reasons the patient has not been here regularly, as been a month sincee her last application of Apligraf. Today she is had a second application of the Apligraf. Electronic Signature(s) Signed: 03/13/2017 1:03:18 PM By: Evlyn Kanner MD, FACS Entered By: Evlyn Kanner on 03/13/2017 13:03:18 LAURA, CALDAS  ([id number]) -------------------------------------------------------------------------------- Physical Exam Details Patient Name: Marilyn, [name] 03/13/2017 12:30 Date of Service: PM Medical Record [id number] Number: Patient Account Number: [id number] March 12, 1956 (60 y.o. Treating RN: Phillis Haggis Date of Birth/Sex: Female) Other Clinician: Primary Care Provider: Noemi Chapel Treating Jearldine Cassady Referring Provider: Noemi Chapel Provider/Extender: Weeks in Treatment: 16 Constitutional . Pulse regular. Respirations normal and unlabored. Afebrile. . Eyes Nonicteric. Reactive to light. Ears, Nose, Mouth, and Throat Lips, teeth, and gums WNL.Marland Kitchen Moist mucosa without lesions. Neck supple and nontender. No palpable supraclavicular or cervical adenopathy. Normal sized without goiter. Respiratory WNL. No retractions.. Breath sounds WNL, No rubs, rales, rhonchi, or wheeze.. Cardiovascular Heart rhythm and rate regular, no murmur or gallop.. Pedal Pulses WNL. No clubbing, cyanosis or edema. Chest Breasts symmetical and no nipple discharge.. Breast tissue WNL, no masses, lumps, or tenderness.. Lymphatic No adneopathy. No adenopathy. No adenopathy. Musculoskeletal Adexa without tenderness or enlargement.. Digits and nails w/o clubbing, cyanosis, infection, petechiae, ischemia, or inflammatory conditions.. Integumentary (Hair, Skin) No suspicious lesions. No crepitus or fluctuance. No peri-wound warmth or erythema. No masses.Marland Kitchen Psychiatric Judgement and insight Intact.. No evidence of depression, anxiety, or agitation.. Notes the patient continued to have a lot of drainage from the wound but the Apligraf is in good position and I have recommended we continue with appropriate layered dressing and a 4-layer compression wrap. Electronic Signature(s) Signed: 03/13/2017 1:04:11 PM By: Evlyn Kanner MD, FACS Entered By: Evlyn Kanner on 03/13/2017 13:04:11 Yokum, [name]  ([id number]) -------------------------------------------------------------------------------- Physician Orders Details Patient Name: SHANEL, PRAZAK 03/13/2017 12:30 Date of Service: PM Medical Record [id number] Number: Patient Account Number: [id number] 05/20/1956 (60 y.o. Treating RN: Phillis Haggis Date of Birth/Sex: Female) Other Clinician: Primary Care Provider: Noemi Chapel Treating Granite Godman Referring  Provider: Noemi Chapel Provider/Extender: Weeks in Treatment: 16 Verbal / Phone Orders: Yes Clinician: Ashok Cordia, Debi Read Back and Verified: Yes Diagnosis Coding ICD-10 Coding Code Description [phone] Non-pressure chronic ulcer of right calf with fat layer exposed I83.012 Varicose veins of right lower extremity with ulcer of calf F17.218 Nicotine dependence, cigarettes, with other nicotine-induced disorders M05.60 Rheumatoid arthritis of unspecified site with involvement of other organs and systems Wound Cleansing Wound #1 Right,Anterior Lower Leg o Clean wound with Normal Saline. o Cleanse wound with mild soap and water Wound #2 Right,Posterior Knee o Clean wound with Normal Saline. o Cleanse wound with mild soap and water Anesthetic Wound #1 Right,Anterior Lower Leg o Topical Lidocaine 4% cream applied to wound bed prior to debridement Wound #2 Right,Posterior Knee o Topical Lidocaine 4% cream applied to wound bed prior to debridement Skin Barriers/Peri-Wound Care Wound #1 Right,Anterior Lower Leg o Skin Prep Wound #2 Right,Posterior Knee o Skin Prep Primary Wound Dressing Wound #1 Right,Anterior Lower Leg o Other: KATALYN, MATIN ([id number]) Wound #2 Right,Posterior Knee o Prisma Ag Secondary Dressing Wound #1 Right,Anterior Lower Leg o ABD pad o Dry Gauze o Other - charcoal, mepitel, steri-strips Wound #2 Right,Posterior Knee o Dry Gauze o Other - coverlet Dressing Change Frequency Wound #1  Right,Anterior Lower Leg o Change dressing every week Wound #2 Right,Posterior Knee o Change dressing every other day. Follow-up Appointments Wound #1 Right,Anterior Lower Leg o Return Appointment in 1 week. o Nurse Visit as needed - next week Wound #2 Right,Posterior Knee o Return Appointment in 1 week. o Nurse Visit as needed - next week Edema Control Wound #1 Right,Anterior Lower Leg o 4-Layer Compression System - Right Lower Extremity - unna to anchor o Elevate legs to the level of the heart and pump ankles as often as possible Electronic Signature(s) Signed: 03/13/2017 3:13:18 PM By: Evlyn Kanner MD, FACS Signed: 03/13/2017 5:02:31 PM By: Alejandro Mulling Entered By: Alejandro Mulling on 03/13/2017 13:17:05 Oler, [name] ([id number]) -------------------------------------------------------------------------------- Problem List Details Patient Name: SAKURA, DENIS 03/13/2017 12:30 Date of Service: PM Medical Record [id number] Number: Patient Account Number: [id number] 1956-11-20 (60 y.o. Treating RN: Phillis Haggis Date of Birth/Sex: Female) Other Clinician: Primary Care Provider: Noemi Chapel Treating Comer Devins Referring Provider: Noemi Chapel Provider/Extender: Weeks in Treatment: 16 Active Problems ICD-10 Encounter Code Description Active Date Diagnosis 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 layer 11/18/2016 Yes exposed I83.012 Varicose veins of right lower extremity with ulcer of calf 11/18/2016 Yes F17.218 Nicotine dependence, cigarettes, with other nicotine- 11/18/2016 Yes induced disorders M05.60 Rheumatoid arthritis of unspecified site with involvement 11/18/2016 Yes of other organs and systems Inactive Problems Resolved Problems Electronic Signature(s) Signed: 03/13/2017 1:02:47 PM By: Evlyn Kanner MD, FACS Entered By: Evlyn Kanner on 03/13/2017 13:02:47 Cutsforth, [name]  ([id number]) -------------------------------------------------------------------------------- Progress Note Details Patient Name: TASHEIKA, KITZMILLER 03/13/2017 12:30 Date of Service: PM Medical Record [id number] Number: Patient Account Number: [id number] Oct 01, 1956 (60 y.o. Treating RN: Phillis Haggis Date of Birth/Sex: Female) Other Clinician: Primary Care Provider: Noemi Chapel Treating Adah Stoneberg Referring Provider: Noemi Chapel Provider/Extender: Weeks in Treatment: 16 Subjective Chief Complaint Information obtained from Patient Patient presents for treatment of an open ulcer due to venous insufficiency to her right lower extremity which she's had for over 6 months History of Present Illness (HPI) The following HPI elements were documented for the patient's wound: Location: ulcerated area on the right lower extremity Quality: Patient reports experiencing a sharp pain to affected area(s). Severity:  Patient states wound are getting worse. Duration: Patient has had the wound for > 6 months prior to seeking treatment at the wound center Timing: Pain in wound is constant (hurts all the time) Context: The wound appeared gradually over time Modifying Factors: Other treatment(s) tried include:recent admission to hospital for a cellulitis and was treated with antibiotics and an Unna boot Associated Signs and Symptoms: Patient reports having increase swelling. 61 year old patient who was recently admitted to the hospital between 11/14/2016 and 11/15/2016 for right lower extremity ulcer with cellulitis. she was asked to stop clindamycin and doxycycline and her prednisone and take Levaquin 500 mg daily by mouth. she was treated for a right lower extremity possibly due to venous stasis versus vascular insufficiency versus infection. She received IV vancomycin during her hospital stay. He is known to have rheumatoid arthritis and stopped taking her treatment because of lack  of insurance. during her hospital stay vascular studies were done -- ABIs were 0.88 on the right and 1.1 on the left, she had triphasic flow on both lower extremities and a plain film of the right tibia and fibula showed no signs of osteomyelitis. She is not a diabetic. She is a smoker however and continued to smoke. lab work noted that her sedimentation rate was 29, CRP was less than 0.8, hemoglobin A1c was 5.2 12/02/2016 -- due to a family emergency and social economic reasons she has been unable to keep her appointment with the vascular department for her workup and is expecting insurance coverage after January 1 12/19/2016 -- she continues to smoke. As have new insurance and hopefully will get her vascular test done as soon as possible. 12/26/2016 -- she has quit smoking for the last 3 days and I have commended her. She did receive her appointment from vein and vascular [place] and it is later this month ZETA, BUCY ([id number]) 01/13/2017 -- she has not been back to see [place] since January 11 and had her compression wrap on all these days almost 3 weeks. I have sternly cautioned her regarding this. Her appointment for a venous duplex study is next Thursday. 01/23/2017 ---- lower extremity venous duplex reflux evaluation shows small saphenous vein is competent but there is evidence of great saphenous vein reflux in the right lower extremity with no evidence of DVT or SVT. There is also deep vein reflux in the right lower extremity. A vascular consult was recommended. Note the left side was not studied. Lower extremity arterial evaluation was done -- unable to obtain right dorsalis pedis or posterior tibial pressures due to an open wound. Posterior tibial and dorsalis pedis waveform appears strong and triphasic. Right TBI was 0.88. Left ABI was 1.21. 01/30/2017 -- he has an appointment to see the vascular surgeons at [facility] this coming Thursday. her Apligraf has been authorized  by her [employer] and we will get this for her for next week. 2/22 2018 -- she was seen by Dr. Tawanna Cooler Early this morning -- she was found to have severe CEAP class VI venous stasis disease and had recommended laser ablation of her right great saphenous vein and stab phlebectomy's of multiple tributary varicosities to recent venous hypertension and improve her wound healing and reduce the risk of recurrent ulceration. She has had her first application of Apligraf today. 02/13/2017 -- her surgery has been scheduled for next Thursday and hence we will not see her to the Monday after. He will be prepared to apply an Apligraf on that day 02/24/2017 -- her  surgery last Thursday was canceled and it has been scheduled for next Thursday. 03/06/2017 -- was seen today by Dr. Tawanna Cooler Early who reviewed a duplex which shows thrombosis of the great saphenous vein with no evidence of DVT -- He recommended she should continue with the wound clinic and would see her back on an as-needed basis. For various reasons the patient has not been here regularly, as been a month sincee her last application of Apligraf. Today she is had a second application of the Apligraf. Objective Constitutional Pulse regular. Respirations normal and unlabored. Afebrile. Vitals Time Taken: 12:42 PM, Height: 63 in, Weight: 214.7 lbs, BMI: 38, Temperature: 98.0 F, Pulse: 79 bpm, Respiratory Rate: 18 breaths/min, Blood Pressure: 169/97 mmHg. Eyes Nonicteric. Reactive to light. Ears, Nose, Mouth, and Throat Bart, Danylle K. ([id number]) Lips, teeth, and gums WNL.Marland Kitchen Moist mucosa without lesions. Neck supple and nontender. No palpable supraclavicular or cervical adenopathy. Normal sized without goiter. Respiratory WNL. No retractions.. Breath sounds WNL, No rubs, rales, rhonchi, or wheeze.. Cardiovascular Heart rhythm and rate regular, no murmur or gallop.. Pedal Pulses WNL. No clubbing, cyanosis or edema. Chest Breasts  symmetical and no nipple discharge.. Breast tissue WNL, no masses, lumps, or tenderness.. Lymphatic No adneopathy. No adenopathy. No adenopathy. Musculoskeletal Adexa without tenderness or enlargement.. Digits and nails w/o clubbing, cyanosis, infection, petechiae, ischemia, or inflammatory conditions.Marland Kitchen Psychiatric Judgement and insight Intact.. No evidence of depression, anxiety, or agitation.. General Notes: the patient continued to have a lot of drainage from the wound but the Apligraf is in good position and I have recommended we continue with appropriate layered dressing and a 4-layer compression wrap. Integumentary (Hair, Skin) No suspicious lesions. No crepitus or fluctuance. No peri-wound warmth or erythema. No masses.. Wound #1 status is Open. Original cause of wound was Gradually Appeared. The wound is located on the Right,Anterior Lower Leg. The wound measures 2cm length x 3cm width x 0.1cm depth; 4.712cm^2 area and 0.471cm^3 volume. There is Fat Layer (Subcutaneous Tissue) Exposed exposed. There is no tunneling or undermining noted. There is a large amount of serosanguineous drainage noted. The wound margin is distinct with the outline attached to the wound base. There is large (67-100%) red granulation within the wound bed. There is a small (1-33%) amount of necrotic tissue within the wound bed including Adherent Slough. The periwound skin appearance exhibited: Excoriation, Scarring, Ecchymosis, Erythema. The surrounding wound skin color is noted with erythema which is circumferential. Periwound temperature was noted as No Abnormality. The periwound has tenderness on palpation. General Notes: This is for a skin sub check Wound #2 status is Open. Original cause of wound was Shear/Friction. The wound is located on the Right,Posterior Knee. The wound measures 0.9cm length x 1cm width x 0.1cm depth; 0.707cm^2 area and 0.071cm^3 volume. The wound is limited to skin breakdown. There  is no tunneling or undermining noted. There is a medium amount of serosanguineous drainage noted. The wound margin is distinct with the outline attached to the wound base. There is large (67-100%) red granulation within the wound bed. There is a small (1-33%) amount of necrotic tissue within the wound bed including Adherent Slough. Periwound temperature was noted as No Abnormality. The periwound has tenderness on palpation. LODA, BIALAS ([id number]) Assessment Active Problems ICD-10 [phone] - Non-pressure chronic ulcer of right calf with fat layer exposed I83.012 - Varicose veins of right lower extremity with ulcer of calf F17.218 - Nicotine dependence, cigarettes, with other nicotine-induced disorders M05.60 - Rheumatoid arthritis of unspecified site  with involvement of other organs and systems Plan Wound Cleansing: Wound #1 Right,Anterior Lower Leg: Clean wound with Normal Saline. Cleanse wound with mild soap and water Wound #2 Right,Posterior Knee: Clean wound with Normal Saline. Cleanse wound with mild soap and water Anesthetic: Wound #1 Right,Anterior Lower Leg: Topical Lidocaine 4% cream applied to wound bed prior to debridement Wound #2 Right,Posterior Knee: Topical Lidocaine 4% cream applied to wound bed prior to debridement Skin Barriers/Peri-Wound Care: Wound #1 Right,Anterior Lower Leg: Skin Prep Wound #2 Right,Posterior Knee: Skin Prep Primary Wound Dressing: Wound #1 Right,Anterior Lower Leg: Other: - Apligraph Wound #2 Right,Posterior Knee: Prisma Ag Secondary Dressing: Wound #1 Right,Anterior Lower Leg: ABD pad Dry Gauze Other - charcoal, mepitel, steri-strips Wound #2 Right,Posterior Knee: Dry Gauze Other - coverlet Dressing Change Frequency: JAYDENCE, MONTANO. ([id number]) Wound #1 Right,Anterior Lower Leg: Change dressing every week Wound #2 Right,Posterior Knee: Change dressing every other day. Follow-up Appointments: Wound #1 Right,Anterior  Lower Leg: Return Appointment in 1 week. Nurse Visit as needed - next week Wound #2 Right,Posterior Knee: Return Appointment in 1 week. Nurse Visit as needed - next week Edema Control: Wound #1 Right,Anterior Lower Leg: 4-Layer Compression System - Right Lower Extremity - unna to anchor Elevate legs to the level of the heart and pump ankles as often as possible we will again do an appropriate layered dressing with adequate amount of Gore-Tex to and a 4-layer compression wrap. She will be ready for her next Apligraf in the coming week. The wound on her popliteal fossa is being dressed separately and this can be changed every other day Electronic Signature(s) Signed: 03/13/2017 3:14:41 PM By: Evlyn Kanner MD, FACS Previous Signature: 03/13/2017 1:05:21 PM Version By: Evlyn Kanner MD, FACS Entered By: Evlyn Kanner on 03/13/2017 15:14:41 Vazques, [name] ([id number]) -------------------------------------------------------------------------------- SuperBill Details Patient Name: [name]. Date of Service: 03/13/2017 Medical Record Number: [id number] Patient Account Number: [id number] Date of Birth/Sex: 1955-12-21 (61 y.o. Female) Treating RN: Ashok Cordia, Debi Primary Care Provider: Noemi Chapel Other Clinician: Referring Provider: Noemi Chapel Treating Provider/Extender: Rudene Re in Treatment: 16 Diagnosis Coding ICD-10 Codes Code Description [phone] Non-pressure chronic ulcer of right calf with fat layer exposed I83.012 Varicose veins of right lower extremity with ulcer of calf F17.218 Nicotine dependence, cigarettes, with other nicotine-induced disorders M05.60 Rheumatoid arthritis of unspecified site with involvement of other organs and systems Facility Procedures CPT4: Description Modifier Quantity Code [id number] (Facility Use Only) [phone] - APPLY MULTLAY COMPRS LWR RT 1 LEG Physician Procedures CPT4: Description Modifier Quantity Code [id number] - WC PHYS  LEVEL 3 - EST PT 1 ICD-10 Description Diagnosis I83.012 Varicose veins of right lower extremity with ulcer of calf 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 layer exposed  M05.60 Rheumatoid arthritis of unspecified site with involvement of other organs and systems Electronic Signature(s) Signed: 03/13/2017 3:15:07 PM By: Evlyn Kanner MD, FACS Previous Signature: 03/13/2017 3:13:18 PM Version By: Evlyn Kanner MD, FACS Previous Signature: 03/13/2017 1:05:36 PM Version By: Evlyn Kanner MD, FACS Entered By: Evlyn Kanner on 03/13/2017 15:15:06

## 2017-03-14 NOTE — Progress Notes (Signed)
Marilyn, [name] ([id number]) Visit Report for 03/13/2017 Arrival Information Details Patient Name: [name], [name]. Date of Service: 03/13/2017 12:30 PM Medical Record Number: [id number] Patient Account Number: [id number] Date of Birth/Sex: 1956/06/30 (61 y.o. Female) Treating RN: Carolyne Fiscal, Debi Primary Care Donzel Romack: Einar Gip Other Clinician: Referring Ramiz Turpin: Einar Gip Treating Dannon Perlow/Extender: Frann Rider in Treatment: 65 Visit Information History Since Last Visit All ordered tests and consults were completed: No Patient Arrived: Ambulatory Added or deleted any medications: No Arrival Time: 12:39 Any new allergies or adverse reactions: No Accompanied By: self Had a fall or experienced change in No Transfer Assistance: None activities of daily living that may affect Patient Identification Verified: Yes risk of falls: Secondary Verification Process Yes Signs or symptoms of abuse/neglect since last No Completed: visito Patient Requires Transmission- No Hospitalized since last visit: No Based Precautions: Has Dressing in Place as Prescribed: Yes Patient Has Alerts: Yes Has Compression in Place as Prescribed: Yes Patient Alerts: L Leg ABI 1.2 Pain Present Now: No R Leg ABI 0.88 ABIs done in [place] Signature(s) Signed: 03/13/2017 5:02:31 PM By: Alric Quan Entered By: Alric Quan on 03/13/2017 12:42:05 [name], [name] ([id number]) -------------------------------------------------------------------------------- Encounter Discharge Information Details Patient Name: [name]. Date of Service: 03/13/2017 12:30 PM Medical Record Number: [id number] Patient Account Number: [id number] Date of Birth/Sex: 07-May-1956 (61 y.o. Female) Treating RN: Carolyne Fiscal, Debi Primary Care Bevelyn Arriola: Einar Gip Other Clinician: Referring Nahum Sherrer: Einar Gip Treating Nadine Ryle/Extender: Frann Rider in Treatment: 16 Encounter  Discharge Information Items Discharge Pain Level: 0 Discharge Condition: Stable Ambulatory Status: Ambulatory Discharge Destination: Home Transportation: Private Auto Accompanied By: self Schedule Follow-up Appointment: Yes Medication Reconciliation completed and provided to Patient/Care No Lyan Moyano: Provided on Clinical Summary of Care: 03/13/2017 Form Type Recipient Paper Patient JP Electronic Signature(s) Signed: 03/13/2017 1:21:55 PM By: Ruthine Dose Entered By: Ruthine Dose on 03/13/2017 13:21:55 Baby, [name] ([id number]) -------------------------------------------------------------------------------- Lower Extremity Assessment Details Patient Name: [name]. Date of Service: 03/13/2017 12:30 PM Medical Record Number: [id number] Patient Account Number: [id number] Date of Birth/Sex: 11-20-1956 (61 y.o. Female) Treating RN: Carolyne Fiscal, Debi Primary Care Marianna Cid: Einar Gip Other Clinician: Referring Mayda Shippee: Einar Gip Treating Roxie Gueye/Extender: Frann Rider in Treatment: 16 Edema Assessment Assessed: [Left: No] [Right: No] E[Left: dema] [Right: :] Calf Left: Right: Point of Measurement: 32 cm From Medial Instep cm 44.2 cm Ankle Left: Right: Point of Measurement: 9 cm From Medial Instep cm 25.6 cm Vascular Assessment Pulses: Dorsalis Pedis Palpable: [Right:Yes] Posterior Tibial Extremity colors, hair growth, and conditions: Extremity Color: [Right:Hyperpigmented] Temperature of Extremity: [Right:Warm] Capillary Refill: [Right:< 3 seconds] Electronic Signature(s) Signed: 03/13/2017 5:02:31 PM By: Alric Quan Entered By: Alric Quan on 03/13/2017 12:57:01 [name], [name] ([id number]) -------------------------------------------------------------------------------- Multi Wound Chart Details Patient Name: [name]. Date of Service: 03/13/2017 12:30 PM Medical Record Number: [id number] Patient Account Number:  [id number] Date of Birth/Sex: [birth year] (61 y.o. Female) Treating RN: Carolyne Fiscal, Debi Primary Care Makye Radle: Einar Gip Other Clinician: Referring Juddson Cobern: Einar Gip Treating Gisselle Galvis/Extender: Frann Rider in Treatment: 16 Vital Signs Height(in): 63 Pulse(bpm): 79 Weight(lbs): 214.7 Blood Pressure 169/97 (mmHg): Body Mass Index(BMI): 38 Temperature(F): 98.0 Respiratory Rate 18 (breaths/min): Photos: [1:No Photos] [2:No Photos] [N/A:N/A] Wound Location: [1:Right Lower Leg - Anterior Right Knee - Posterior] [N/A:N/A] Wounding Event: [1:Gradually Appeared] [2:Shear/Friction] [N/A:N/A] Primary Etiology: [1:Venous Leg Ulcer] [2:Skin Tear] [N/A:N/A] Comorbid History: [1:Chronic Obstructive Pulmonary Disease (COPD), Hypertension, Rheumatoid Arthritis] [2:Chronic Obstructive Pulmonary Disease (COPD), Hypertension, Rheumatoid Arthritis] [N/A:N/A] Date Acquired: [1:05/19/2016] [2:03/06/2017] [N/A:N/A] Weeks  of Treatment: [1:16] [2:1] [N/A:N/A] Wound Status: [1:Open] [2:Open] [N/A:N/A] Measurements L x W x D 2x3x0.1 [2:0.9x1x0.1] [N/A:N/A] (cm) Area (cm) : [1:4.712] [2:0.707] [N/A:N/A] Volume (cm) : [1:0.471] [2:0.071] [N/A:N/A] % Reduction in Area: [1:73.70%] [2:-28.50%] [N/A:N/A] % Reduction in Volume: 73.70% [2:-29.10%] [N/A:N/A] Classification: [1:Full Thickness Without Exposed Support Structures] [2:Partial Thickness] [N/A:N/A] Exudate Amount: [1:Large] [2:Medium] [N/A:N/A] Exudate Type: [1:Serosanguineous] [2:Serosanguineous] [N/A:N/A] Exudate Color: [1:red, brown] [2:red, brown] [N/A:N/A] Wound Margin: [1:Distinct, outline attached Distinct, outline attached] [N/A:N/A] Granulation Amount: [1:Large (67-100%)] [2:Large (67-100%)] [N/A:N/A] Granulation Quality: [1:Red, Hyper-granulation] [2:Red] [N/A:N/A] Necrotic Amount: [1:Small (1-33%)] [2:Small (1-33%)] [N/A:N/A] Exposed Structures: [1:Fat Layer (Subcutaneous Fascia: No Tissue) Exposed: Yes Fascia: No] [2:Fat  Layer (Subcutaneous Tissue) Exposed: No] [N/A:N/A] Tendon: No Tendon: No Muscle: No Muscle: No Joint: No Joint: No Bone: No Bone: No Limited to Skin Breakdown Epithelialization: Small (1-33%) None N/A Periwound Skin Texture: Excoriation: Yes No Abnormalities Noted N/A Scarring: Yes Periwound Skin No Abnormalities Noted No Abnormalities Noted N/A Moisture: Periwound Skin Color: Ecchymosis: Yes No Abnormalities Noted N/A Erythema: Yes Erythema Location: Circumferential N/A N/A Temperature: No Abnormality No Abnormality N/A Tenderness on Yes Yes N/A Palpation: Wound Preparation: Topical Anesthetic Ulcer Cleansing: N/A Applied: None Rinsed/Irrigated with Saline Topical Anesthetic Applied: Other: lidocaine 4% Assessment Notes: This is for a skin sub N/A N/A check Treatment Notes Electronic Signature(s) Signed: 03/13/2017 1:03:03 PM By: Christin Fudge MD, FACS Entered By: Christin Fudge on 03/13/2017 13:03:03 [name], [name] ([id number]) -------------------------------------------------------------------------------- [place] Details Patient Name: BRIYONNA, OMARA. Date of Service: 03/13/2017 12:30 PM Medical Record Number: [id number] Patient Account Number: [id number] Date of Birth/Sex: 02-01-1956 (61 y.o. Female) Treating RN: Carolyne Fiscal, Debi Primary Care Prarthana Parlin: Einar Gip Other Clinician: Referring Avik Leoni: Einar Gip Treating Alejandria Wessells/Extender: Frann Rider in Treatment: 16 Active Inactive ` Abuse / Safety / Falls / Self Care Management Nursing Diagnoses: Potential for falls Goals: Patient will remain injury free Date Initiated: 11/18/2016 Target Resolution Date: 01/20/2017 Goal Status: Active Interventions: Assess fall risk on admission and as needed Assess self care needs on admission and as needed Notes: ` Nutrition Nursing Diagnoses: Imbalanced nutrition Goals: Patient/caregiver agrees to and verbalizes understanding of  need to use nutritional supplements and/or vitamins as prescribed Date Initiated: 11/18/2016 Target Resolution Date: 01/20/2017 Goal Status: Active Interventions: Assess patient nutrition upon admission and as needed per policy Notes: ` Orientation to the Wound Care Program Nursing Diagnoses: [name], [name] ([id number]) Knowledge deficit related to the wound healing center program Goals: Patient/caregiver will verbalize understanding of the [place] Date Initiated: 11/18/2016 Target Resolution Date: 01/20/2017 Goal Status: Active Interventions: Provide education on orientation to the wound center Notes: ` Pain, Acute or Chronic Nursing Diagnoses: Pain, acute or chronic: actual or potential Potential alteration in comfort, pain Goals: Patient will verbalize adequate pain control and receive pain control interventions during procedures as needed Date Initiated: 11/18/2016 Target Resolution Date: 01/20/2017 Goal Status: Active Patient/caregiver will verbalize adequate pain control between visits Date Initiated: 11/18/2016 Target Resolution Date: 01/20/2017 Goal Status: Active Patient/caregiver will verbalize comfort level met Date Initiated: 11/18/2016 Target Resolution Date: 01/20/2017 Goal Status: Active Interventions: Assess comfort goal upon admission Complete pain assessment as per visit requirements Notes: ` Soft Tissue Infection Nursing Diagnoses: Impaired tissue integrity Knowledge deficit related to disease process and management Knowledge deficit related to home infection control: handwashing, handling of soiled dressings, supply storage Goals: [name], [name] ([id number]) Patient/caregiver will verbalize understanding of or measures to prevent infection and contamination in the home setting Date  Initiated: 11/18/2016 Target Resolution Date: 01/20/2017 Goal Status: Active Patient's soft tissue infection will resolve Date Initiated:  11/18/2016 Target Resolution Date: 01/20/2017 Goal Status: Active Interventions: Assess signs and symptoms of infection every visit Provide education on infection Treatment Activities: Education provided on Infection : 12/26/2016 Notes: ` Wound/Skin Impairment Nursing Diagnoses: Impaired tissue integrity Goals: Ulcer/skin breakdown will have a volume reduction of 30% by week 4 Date Initiated: 11/18/2016 Target Resolution Date: 01/20/2017 Goal Status: Active Ulcer/skin breakdown will have a volume reduction of 50% by week 8 Date Initiated: 11/18/2016 Target Resolution Date: 01/20/2017 Goal Status: Active Ulcer/skin breakdown will have a volume reduction of 80% by week 12 Date Initiated: 11/18/2016 Target Resolution Date: 01/20/2017 Goal Status: Active Interventions: Assess patient/caregiver ability to perform ulcer/skin care regimen upon admission and as needed Assess ulceration(s) every visit Provide education on smoking Notes: Electronic Signature(s) Signed: 03/13/2017 5:02:31 PM By: Alric Quan Entered By: Alric Quan on 03/13/2017 12:57:07 [name], [name] ([id number]) -------------------------------------------------------------------------------- Pain Assessment Details Patient Name: [name]. Date of Service: 03/13/2017 12:30 PM Medical Record Number: [id number] Patient Account Number: [id number] Date of Birth/Sex: Apr 30, 1956 (61 y.o. Female) Treating RN: Carolyne Fiscal, Debi Primary Care Stina Gane: Einar Gip Other Clinician: Referring Xiara Knisley: Einar Gip Treating Christa Fasig/Extender: Frann Rider in Treatment: 16 Active Problems Location of Pain Severity and Description of Pain Patient Has Paino No Site Locations With Dressing Change: No Pain Management and Medication Current Pain Management: Electronic Signature(s) Signed: 03/13/2017 5:02:31 PM By: Alric Quan Entered By: Alric Quan on 03/13/2017 12:42:11 [name], [name]  ([id number]) -------------------------------------------------------------------------------- Patient/Caregiver Education Details Patient Name: [name]. Date of Service: 03/13/2017 12:30 PM Medical Record Number: [id number] Patient Account Number: [id number] Date of Birth/Gender: Jun 18, 1956 (61 y.o. Female) Treating RN: Carolyne Fiscal, Debi Primary Care Physician: Einar Gip Other Clinician: Referring Physician: Einar Gip Treating Physician/Extender: Frann Rider in Treatment: 16 Education Assessment Education Provided To: Patient Education Topics Provided Wound/Skin Impairment: Handouts: Other: change dressing as ordered Methods: Demonstration, Explain/Verbal Responses: State content correctly Electronic Signature(s) Signed: 03/13/2017 5:02:31 PM By: Alric Quan Entered By: Alric Quan on 03/13/2017 13:18:43 [name], [name] ([id number]) -------------------------------------------------------------------------------- Wound Assessment Details Patient Name: [name]. Date of Service: 03/13/2017 12:30 PM Medical Record Number: [id number] Patient Account Number: [id number] Date of Birth/Sex: 25-Apr-1956 (61 y.o. Female) Treating RN: Carolyne Fiscal, Debi Primary Care Hazeline Charnley: Einar Gip Other Clinician: Referring Jolita Haefner: Einar Gip Treating Yamilette Garretson/Extender: Frann Rider in Treatment: 16 Wound Status Wound Number: 1 Primary Venous Leg Ulcer Etiology: Wound Location: Right Lower Leg - Anterior Wound Open Wounding Event: Gradually Appeared Status: Date Acquired: 05/19/2016 Comorbid Chronic Obstructive Pulmonary Weeks Of Treatment: 16 History: Disease (COPD), Hypertension, Clustered Wound: No Rheumatoid Arthritis Photos Photo Uploaded By: Alric Quan on 03/13/2017 15:42:38 Wound Measurements Length: (cm) 2 Width: (cm) 3 Depth: (cm) 0.1 Area: (cm) 4.712 Volume: (cm) 0.471 % Reduction in Area: 73.7% % Reduction in  Volume: 73.7% Epithelialization: Small (1-33%) Tunneling: No Undermining: No Wound Description Full Thickness Without Exposed Classification: Support Structures Wound Margin: Distinct, outline attached Exudate Large Amount: Exudate Type: Serosanguineous Exudate Color: red, brown Foul Odor After Cleansing: No Slough/Fibrino Yes Wound Bed Granulation Amount: Large (67-100%) Exposed Structure Granulation Quality: Red, Hyper-granulation Fascia Exposed: No [name], Keyaria K. ([id number]) Necrotic Amount: Small (1-33%) Fat Layer (Subcutaneous Tissue) Exposed: Yes Necrotic Quality: Adherent Slough Tendon Exposed: No Muscle Exposed: No Joint Exposed: No Bone Exposed: No Periwound Skin Texture Texture Color No Abnormalities Noted: No No Abnormalities Noted: No Excoriation: Yes Ecchymosis: Yes Scarring: Yes  Erythema: Yes Erythema Location: Circumferential Moisture No Abnormalities Noted: No Temperature / Pain Temperature: No Abnormality Tenderness on Palpation: Yes Wound Preparation Topical Anesthetic Applied: None Assessment Notes This is for a skin sub check Treatment Notes Wound #1 (Right, Anterior Lower Leg) 5. Secondary Dressing Applied ABD Pad Dry Gauze 7. Secured with Tape 4-Layer Compression System - Right Lower Extremity Notes unna to anchor, charcoal Electronic Signature(s) Signed: 03/13/2017 5:02:31 PM By: Alric Quan Entered By: Alric Quan on 03/13/2017 12:55:23 [name], [name] ([id number]) -------------------------------------------------------------------------------- Wound Assessment Details Patient Name: [name]. Date of Service: 03/13/2017 12:30 PM Medical Record Number: [id number] Patient Account Number: [id number] Date of Birth/Sex: September 23, 1956 (60 y.o. Female) Treating RN: Carolyne Fiscal, Debi Primary Care Kierstyn Baranowski: Einar Gip Other Clinician: Referring Riah Kehoe: Einar Gip Treating Camira Geidel/Extender: Frann Rider  in Treatment: 16 Wound Status Wound Number: 2 Primary Skin Tear Etiology: Wound Location: Right Knee - Posterior Wound Open Wounding Event: Shear/Friction Status: Date Acquired: 03/06/2017 Comorbid Chronic Obstructive Pulmonary Weeks Of Treatment: 1 History: Disease (COPD), Hypertension, Clustered Wound: No Rheumatoid Arthritis Photos Photo Uploaded By: Alric Quan on 03/13/2017 15:42:57 Wound Measurements Length: (cm) 0.9 Width: (cm) 1 Depth: (cm) 0.1 Area: (cm) 0.707 Volume: (cm) 0.071 % Reduction in Area: -28.5% % Reduction in Volume: -29.1% Epithelialization: None Tunneling: No Undermining: No Wound Description Classification: Partial Thickness Foul Odor Af Wound Margin: Distinct, outline attached Slough/Fibri Exudate Amount: Medium Exudate Type: Serosanguineous Exudate Color: red, brown ter Cleansing: No no No Wound Bed Granulation Amount: Large (67-100%) Exposed Structure Granulation Quality: Red Fascia Exposed: No Necrotic Amount: Small (1-33%) Fat Layer (Subcutaneous Tissue) Exposed: No Necrotic Quality: Adherent Slough Tendon Exposed: No Hornbeck, Mashelle K. ([id number]) Muscle Exposed: No Joint Exposed: No Bone Exposed: No Limited to Skin Breakdown Periwound Skin Texture Texture Color No Abnormalities Noted: No No Abnormalities Noted: No Moisture Temperature / Pain No Abnormalities Noted: No Temperature: No Abnormality Tenderness on Palpation: Yes Wound Preparation Ulcer Cleansing: Rinsed/Irrigated with Saline Topical Anesthetic Applied: Other: lidocaine 4%, Treatment Notes Wound #2 (Right, Posterior Knee) 1. Cleansed with: Clean wound with Normal Saline Cleanse wound with antibacterial soap and water 2. Anesthetic Topical Lidocaine 4% cream to wound bed prior to debridement 4. Dressing Applied: Prisma Ag 5. Secondary Dressing Applied Dry Gauze Notes coverlet Electronic Signature(s) Signed: 03/13/2017 5:02:31 PM By: Alric Quan Entered By: Alric Quan on 03/13/2017 12:54:31 [place], [name] ([id number]) -------------------------------------------------------------------------------- [place] Details Patient Name: [name] Date of Service: 03/13/2017 12:30 PM Medical Record Number: [id number] Patient Account Number: [id number] Date of Birth/Sex: 1956/12/05 (61 y.o. Female) Treating RN: Carolyne Fiscal, Debi Primary Care Yair Dusza: Einar Gip Other Clinician: Referring Seaton Hofmann: Einar Gip Treating Remiel Corti/Extender: Frann Rider in Treatment: 16 Vital Signs Time Taken: 12:42 Temperature (F): 98.0 Height (in): 63 Pulse (bpm): 79 Weight (lbs): 214.7 Respiratory Rate (breaths/min): 18 Body Mass Index (BMI): 38 Blood Pressure (mmHg): 169/97 Reference Range: 80 - 120 mg / dl Electronic Signature(s) Signed: 03/13/2017 5:02:31 PM By: Alric Quan Entered By: Alric Quan on 03/13/2017 12:44:55

## 2017-03-20 ENCOUNTER — Encounter: Payer: Self-pay | Admitting: Internal Medicine

## 2017-03-20 ENCOUNTER — Encounter: Payer: BLUE CROSS/BLUE SHIELD | Attending: Surgery | Admitting: Surgery

## 2017-03-20 DIAGNOSIS — Z79899 Other long term (current) drug therapy: Secondary | ICD-10-CM | POA: Diagnosis not present

## 2017-03-20 DIAGNOSIS — I83012 Varicose veins of right lower extremity with ulcer of calf: Secondary | ICD-10-CM | POA: Insufficient documentation

## 2017-03-20 DIAGNOSIS — Z880 Allergy status to penicillin: Secondary | ICD-10-CM | POA: Insufficient documentation

## 2017-03-20 DIAGNOSIS — F17218 Nicotine dependence, cigarettes, with other nicotine-induced disorders: Secondary | ICD-10-CM | POA: Diagnosis not present

## 2017-03-20 DIAGNOSIS — J449 Chronic obstructive pulmonary disease, unspecified: Secondary | ICD-10-CM | POA: Diagnosis not present

## 2017-03-20 DIAGNOSIS — L97212 Non-pressure chronic ulcer of right calf with fat layer exposed: Secondary | ICD-10-CM | POA: Insufficient documentation

## 2017-03-20 DIAGNOSIS — M0560 Rheumatoid arthritis of unspecified site with involvement of other organs and systems: Secondary | ICD-10-CM | POA: Diagnosis not present

## 2017-03-20 DIAGNOSIS — I10 Essential (primary) hypertension: Secondary | ICD-10-CM | POA: Insufficient documentation

## 2017-03-21 NOTE — Progress Notes (Signed)
IZABELL, SCHALK ([id number]) Visit Report for 03/20/2017 Chief Complaint Document Details Patient Name: [name], [name]. Date of Service: 03/20/2017 3:30 PM Medical Record Number: [id number] Patient Account Number: [id number] Date of Birth/Sex: Feb 05, 1956 (61 y.o. Female) Treating RN: Riki Sheer Primary Care Provider: Noemi Chapel Other Clinician: Referring Provider: Noemi Chapel Treating Provider/Extender: Rudene Re in Treatment: 17 Information Obtained from: Patient Chief Complaint Patient presents for treatment of an open ulcer due to venous insufficiency to her right lower extremity which she's had for over 6 months Electronic Signature(s) Signed: 03/20/2017 4:24:49 PM By: Evlyn Kanner MD, FACS Entered By: Evlyn Kanner on 03/20/2017 16:24:49 Lajara, [name] ([id number]) -------------------------------------------------------------------------------- Cellular or Tissue Based Product Details Patient Name: Marilyn Mins. Date of Service: 03/20/2017 3:30 PM Medical Record Number: [id number] Patient Account Number: [id number] Date of Birth/Sex: 02-21-1956 (61 y.o. Female) Treating RN: Riki Sheer Primary Care Provider: Noemi Chapel Other Clinician: Referring Provider: Noemi Chapel Treating Provider/Extender: Rudene Re in Treatment: 17 Cellular or Tissue Based Wound #1 Right,Anterior Lower Leg Product Type Applied to: Performed By: Physician Evlyn Kanner, MD Cellular or Tissue Based Apligraf Product Type: Pre-procedure Yes - 16:03 Verification/Time Out Taken: Location: trunk / arms / legs Wound Size (sq cm): 12.25 Product Size (sq cm): 44 Waste Size (sq cm): 31.5 Amount of Product Applied (sq cm): 12.5 Lot #: [id number] Expiration Date: 03/28/2017 Fenestrated: Yes Instrument: Blade Reconstituted: Yes Solution Type: NORMAL SALINE Solution Amount: Lot #: [id number] Solution Expiration 12/16/2018 Date: Secured: Yes Secured With:  Steri-Strips Dressing Applied: Yes Primary Dressing: MEPITEL Procedural Pain: 0 Post Procedural Pain: 0 Response to Treatment: Procedure was tolerated well Post Procedure Diagnosis Same as Pre-procedure Electronic Signature(s) Signed: 03/20/2017 4:24:44 PM By: Evlyn Kanner MD, FACS Entered By: Evlyn Kanner on 03/20/2017 16:24:44 [name], [name] ([id number]) [name], [name] ([id number]) -------------------------------------------------------------------------------- HPI Details Patient Name: Marilyn Mins. Date of Service: 03/20/2017 3:30 PM Medical Record Number: [id number] Patient Account Number: [id number] Date of Birth/Sex: 08-19-1956 (61 y.o. Female) Treating RN: Riki Sheer Primary Care Provider: Noemi Chapel Other Clinician: Referring Provider: Noemi Chapel Treating Provider/Extender: Rudene Re in Treatment: 17 History of Present Illness Location: ulcerated area on the right lower extremity Quality: Patient reports experiencing a sharp pain to affected area(s). Severity: Patient states wound are getting worse. Duration: Patient has had the wound for > 6 months prior to seeking treatment at the wound center Timing: Pain in wound is constant (hurts all the time) Context: The wound appeared gradually over time Modifying Factors: Other treatment(s) tried include:recent admission to hospital for a cellulitis and was treated with antibiotics and an Unna boot Associated Signs and Symptoms: Patient reports having increase swelling. HPI Description: 61 year old patient who was recently admitted to the hospital between 11/14/2016 and 11/15/2016 for right lower extremity ulcer with cellulitis. she was asked to stop clindamycin and doxycycline and her prednisone and take Levaquin 500 mg daily by mouth. she was treated for a right lower extremity possibly due to venous stasis versus vascular insufficiency versus infection. She received IV vancomycin during her  hospital stay. He is known to have rheumatoid arthritis and stopped taking her treatment because of lack of insurance. during her hospital stay vascular studies were done -- ABIs were 0.88 on the right and 1.1 on the left, she had triphasic flow on both lower extremities and a plain film of the right tibia and fibula showed no signs of osteomyelitis. She is not a diabetic. She is a smoker however and continued  to smoke. lab work noted that her sedimentation rate was 29, CRP was less than 0.8, hemoglobin A1c was 5.2 12/02/2016 -- due to a family emergency and social economic reasons she has been unable to keep her appointment with the vascular department for her workup and is expecting insurance coverage after January 1 12/19/2016 -- she continues to smoke. As have new insurance and hopefully will get her vascular test done as soon as possible. 12/26/2016 -- she has quit smoking for the last 3 days and I have commended her. She did receive her appointment from [facility] and it is later this month 01/13/2017 -- she has not been back to see [place] since January 11 and had her compression wrap on all these days almost 3 weeks. I have sternly cautioned her regarding this. Her appointment for a venous duplex study is next Thursday. 01/23/2017 ---- lower extremity venous duplex reflux evaluation shows small saphenous vein is competent but there is evidence of great saphenous vein reflux in the right lower extremity with no evidence of DVT or SVT. There is also deep vein reflux in the right lower extremity. A vascular consult was recommended. Note the left side was not studied. Lower extremity arterial evaluation was done -- unable to obtain right dorsalis pedis or posterior tibial pressures due to an open wound. Posterior tibial and dorsalis pedis waveform appears strong and triphasic. Right TBI was 0.88. Left ABI was 1.21. [name], [name] ([id number]) 01/30/2017 -- he has an  appointment to see the vascular surgeons at [facility] this coming Thursday. her Apligraf has been authorized by her [employer] and we will get this for her for next week. 2/22 2018 -- she was seen by Dr. Tawanna Cooler Early this morning -- she was found to have severe CEAP class VI venous stasis disease and had recommended laser ablation of her right great saphenous vein and stab phlebectomy's of multiple tributary varicosities to recent venous hypertension and improve her wound healing and reduce the risk of recurrent ulceration. She has had her first application of Apligraf today. 02/13/2017 -- her surgery has been scheduled for next Thursday and hence we will not see her to the Monday after. He will be prepared to apply an Apligraf on that day 02/24/2017 -- her surgery last Thursday was canceled and it has been scheduled for next Thursday. 03/06/2017 -- was seen today by Dr. Tawanna Cooler Early who reviewed a duplex which shows thrombosis of the great saphenous vein with no evidence of DVT -- He recommended she should continue with the wound clinic and would see her back on an as-needed basis. For various reasons the patient has not been here regularly, as been a month sincee her last application of Apligraf. Today she is had a second application of the Apligraf. 03/20/2017 -- he is here for her third application of Apligraf Electronic Signature(s) Signed: 03/20/2017 4:25:09 PM By: Evlyn Kanner MD, FACS Entered By: Evlyn Kanner on 03/20/2017 16:25:09 [name], [name] ([id number]) -------------------------------------------------------------------------------- Physical Exam Details Patient Name: Marilyn Mins. Date of Service: 03/20/2017 3:30 PM Medical Record Number: [id number] Patient Account Number: [id number] Date of Birth/Sex: [birth year] (61 y.o. Female) Treating RN: Riki Sheer Primary Care Provider: Noemi Chapel Other Clinician: Referring Provider: Noemi Chapel Treating  Provider/Extender: Rudene Re in Treatment: 17 Constitutional . Pulse regular. Respirations normal and unlabored. Afebrile. . Eyes Nonicteric. Reactive to light. Ears, Nose, Mouth, and Throat Lips, teeth, and gums WNL.Marland Kitchen Moist mucosa without lesions. Neck supple and nontender. No  palpable supraclavicular or cervical adenopathy. Normal sized without goiter. Respiratory WNL. No retractions.. Breath sounds WNL, No rubs, rales, rhonchi, or wheeze.Marland Kitchen Lymphatic No adneopathy. No adenopathy. No adenopathy. Musculoskeletal Adexa without tenderness or enlargement.. Digits and nails w/o clubbing, cyanosis, infection, petechiae, ischemia, or inflammatory conditions.. Integumentary (Hair, Skin) No suspicious lesions. No crepitus or fluctuance. No peri-wound warmth or erythema. No masses.Marland Kitchen Psychiatric Judgement and insight Intact.. No evidence of depression, anxiety, or agitation.. Notes the wound is looking good and there is good amount of epithelialization at the edges. She will have her next Apligraf applied with the usual precautions and bolstered in place. A 4-layer compression was applied over this Electronic Signature(s) Signed: 03/20/2017 4:25:51 PM By: Evlyn Kanner MD, FACS Entered By: Evlyn Kanner on 03/20/2017 16:25:51 [name], [name] ([id number]) -------------------------------------------------------------------------------- Physician Orders Details Patient Name: Marilyn Mins. Date of Service: 03/20/2017 3:30 PM Medical Record Number: [id number] Patient Account Number: [id number] Date of Birth/Sex: 1956/02/05 (61 y.o. Female) Treating RN: Ashok Cordia, Debi Primary Care Provider: Noemi Chapel Other Clinician: Referring Provider: Noemi Chapel Treating Provider/Extender: Rudene Re in Treatment: 17 Verbal / Phone Orders: Yes Clinician: Ashok Cordia, Debi Read Back and Verified: Yes Diagnosis Coding Wound Cleansing Wound #1 Right,Anterior Lower Leg o Clean  wound with Normal Saline. o Cleanse wound with mild soap and water Wound #2 Right,Posterior Knee o Clean wound with Normal Saline. o Cleanse wound with mild soap and water Anesthetic Wound #1 Right,Anterior Lower Leg o Topical Lidocaine 4% cream applied to wound bed prior to debridement Wound #2 Right,Posterior Knee o Topical Lidocaine 4% cream applied to wound bed prior to debridement Skin Barriers/Peri-Wound Care Wound #1 Right,Anterior Lower Leg o Skin Prep Wound #2 Right,Posterior Knee o Skin Prep Primary Wound Dressing Wound #1 Right,Anterior Lower Leg o Other: - Apligraph Wound #2 Right,Posterior Knee o Prisma Ag Secondary Dressing Wound #1 Right,Anterior Lower Leg o ABD pad o Dry Gauze o Other - charcoal, mepitel, steri-strips [place], [name] ([id number]) Wound #2 Right,Posterior Knee o Dry Gauze o Other - coverlet Dressing Change Frequency Wound #1 Right,Anterior Lower Leg o Change dressing every week Wound #2 Right,Posterior Knee o Change dressing every other day. Follow-up Appointments Wound #1 Right,Anterior Lower Leg o Return Appointment in 1 week. o Nurse Visit as needed - next week Wound #2 Right,Posterior Knee o Return Appointment in 1 week. o Nurse Visit as needed - next week Edema Control Wound #1 Right,Anterior Lower Leg o 4-Layer Compression System - Right Lower Extremity - unna to anchor o Elevate legs to the level of the heart and pump ankles as often as possible Electronic Signature(s) Signed: 03/20/2017 4:30:26 PM By: Evlyn Kanner MD, FACS Signed: 03/20/2017 5:04:07 PM By: Alejandro Mulling Entered By: Alejandro Mulling on 03/20/2017 16:08:35 [name], [name] ([id number]) -------------------------------------------------------------------------------- Problem List Details Patient Name: [name], [name]. Date of Service: 03/20/2017 3:30 PM Medical Record Number: [id number] Patient Account Number:  [id number] Date of Birth/Sex: [birth year] (61 y.o. 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/18/2016 Yes exposed I83.012 Varicose veins of right lower extremity with ulcer of calf 11/18/2016 Yes F17.218 Nicotine dependence, cigarettes, with other nicotine- 11/18/2016 Yes induced disorders M05.60 Rheumatoid arthritis of unspecified site with involvement 11/18/2016 Yes of other organs and systems Inactive Problems Resolved Problems Electronic Signature(s) Signed: 03/20/2017 4:24:23 PM  By: Evlyn Kanner MD, FACS Entered By: Evlyn Kanner on 03/20/2017 16:24:23 Marilyn Mins ([id number]) -------------------------------------------------------------------------------- Progress Note Details Patient Name: Marilyn Mins. Date of Service: 03/20/2017 3:30 PM Medical Record Number: [id number] Patient Account Number: [id number] Date of Birth/Sex: 10-Jun-1956 (61 y.o. Female) Treating RN: Riki Sheer Primary Care Provider: Noemi Chapel Other Clinician: Referring Provider: Noemi Chapel Treating Provider/Extender: Rudene Re in Treatment: 17 Subjective Chief Complaint Information obtained from Patient Patient presents for treatment of an open ulcer due to venous insufficiency to her right lower extremity which she's had for over 6 months History of Present Illness (HPI) The following HPI elements were documented for the patient's wound: Location: ulcerated area on the right lower extremity Quality: Patient reports experiencing a sharp pain to affected area(s). Severity: Patient states wound are getting worse. Duration: Patient has had the wound for > 6 months prior to seeking treatment at the  wound center Timing: Pain in wound is constant (hurts all the time) Context: The wound appeared gradually over time Modifying Factors: Other treatment(s) tried include:recent admission to hospital for a cellulitis and was treated with antibiotics and an Unna boot Associated Signs and Symptoms: Patient reports having increase swelling. 61 year old patient who was recently admitted to the hospital between 11/14/2016 and 11/15/2016 for right lower extremity ulcer with cellulitis. she was asked to stop clindamycin and doxycycline and her prednisone and take Levaquin 500 mg daily by mouth. she was treated for a right lower extremity possibly due to venous stasis versus vascular insufficiency versus infection. She received IV vancomycin during her hospital stay. He is known to have rheumatoid arthritis and stopped taking her treatment because of lack of insurance. during her hospital stay vascular studies were done -- ABIs were 0.88 on the right and 1.1 on the left, she had triphasic flow on both lower extremities and a plain film of the right tibia and fibula showed no signs of osteomyelitis. She is not a diabetic. She is a smoker however and continued to smoke. lab work noted that her sedimentation rate was 29, CRP was less than 0.8, hemoglobin A1c was 5.2 12/02/2016 -- due to a family emergency and social economic reasons she has been unable to keep her appointment with the vascular department for her workup and is expecting insurance coverage after January 1 12/19/2016 -- she continues to smoke. As have new insurance and hopefully will get her vascular test done as soon as possible. 12/26/2016 -- she has quit smoking for the last 3 days and I have commended her. She did receive her appointment from [facility] vascular [place] and it is later this month 01/13/2017 -- she has not been back to see [place] since January 11 and had her compression wrap on all these days almost 3 weeks. I have sternly  cautioned her regarding this. Her appointment for a venous [name], [name]. ([id number]) duplex study is next Thursday. 01/23/2017 ---- lower extremity venous duplex reflux evaluation shows small saphenous vein is competent but there is evidence of great saphenous vein reflux in the right lower extremity with no evidence of DVT or SVT. There is also deep vein reflux in the right lower extremity. A vascular consult was recommended. Note the left side was not studied. Lower extremity arterial evaluation was done -- unable to obtain right dorsalis pedis or posterior tibial pressures due to an open wound. Posterior tibial and dorsalis pedis waveform appears strong and triphasic. Right TBI was 0.88. Left ABI was 1.21. 01/30/2017 -- he has  an appointment to see the vascular surgeons at [facility] this coming Thursday. her Apligraf has been authorized by her [employer] and we will get this for her for next week. 2/22 2018 -- she was seen by Dr. Tawanna Cooler Early this morning -- she was found to have severe CEAP class VI venous stasis disease and had recommended laser ablation of her right great saphenous vein and stab phlebectomy's of multiple tributary varicosities to recent venous hypertension and improve her wound healing and reduce the risk of recurrent ulceration. She has had her first application of Apligraf today. 02/13/2017 -- her surgery has been scheduled for next Thursday and hence we will not see her to the Monday after. He will be prepared to apply an Apligraf on that day 02/24/2017 -- her surgery last Thursday was canceled and it has been scheduled for next Thursday. 03/06/2017 -- was seen today by Dr. Tawanna Cooler Early who reviewed a duplex which shows thrombosis of the great saphenous vein with no evidence of DVT -- He recommended she should continue with the wound clinic and would see her back on an as-needed basis. For various reasons the patient has not been here regularly, as been a  month sincee her last application of Apligraf. Today she is had a second application of the Apligraf. 03/20/2017 -- he is here for her third application of Apligraf Objective Constitutional Pulse regular. Respirations normal and unlabored. Afebrile. Vitals Time Taken: 3:39 PM, Height: 63 in, Weight: 214.7 lbs, BMI: 38, Temperature: 98.1 F, Pulse: 80 bpm, Respiratory Rate: 18 breaths/min, Blood Pressure: 175/79 mmHg. Eyes Nonicteric. Reactive to light. Ears, Nose, Mouth, and Throat Roadcap, Dorraine K. ([id number]) Lips, teeth, and gums WNL.Marland Kitchen Moist mucosa without lesions. Neck supple and nontender. No palpable supraclavicular or cervical adenopathy. Normal sized without goiter. Respiratory WNL. No retractions.. Breath sounds WNL, No rubs, rales, rhonchi, or wheeze.Marland Kitchen Lymphatic No adneopathy. No adenopathy. No adenopathy. Musculoskeletal Adexa without tenderness or enlargement.. Digits and nails w/o clubbing, cyanosis, infection, petechiae, ischemia, or inflammatory conditions.Marland Kitchen Psychiatric Judgement and insight Intact.. No evidence of depression, anxiety, or agitation.. General Notes: the wound is looking good and there is good amount of epithelialization at the edges. She will have her next Apligraf applied with the usual precautions and bolstered in place. A 4-layer compression was applied over this Integumentary (Hair, Skin) No suspicious lesions. No crepitus or fluctuance. No peri-wound warmth or erythema. No masses.. Wound #1 status is Open. Original cause of wound was Gradually Appeared. The wound is located on the Right,Anterior Lower Leg. The wound measures 3.5cm length x 3.5cm width x 0.1cm depth; 9.621cm^2 area and 0.962cm^3 volume. There is Fat Layer (Subcutaneous Tissue) Exposed exposed. There is no tunneling or undermining noted. There is a large amount of serosanguineous drainage noted. The wound margin is distinct with the outline attached to the wound base. There is  large (67-100%) red granulation within the wound bed. There is a small (1-33%) amount of necrotic tissue within the wound bed including Adherent Slough. The periwound skin appearance exhibited: Excoriation, Scarring, Ecchymosis, Erythema. The surrounding wound skin color is noted with erythema which is circumferential. Periwound temperature was noted as No Abnormality. The periwound has tenderness on palpation. Wound #2 status is Open. Original cause of wound was Shear/Friction. The wound is located on the Right,Posterior Knee. The wound measures 0.5cm length x 1cm width x 0.1cm depth; 0.393cm^2 area and 0.039cm^3 volume. The wound is limited to skin breakdown. There is no tunneling or undermining noted. There is a  medium amount of serosanguineous drainage noted. The wound margin is distinct with the outline attached to the wound base. There is large (67-100%) red granulation within the wound bed. There is no necrotic tissue within the wound bed. Periwound temperature was noted as No Abnormality. The periwound has tenderness on palpation. Assessment [name], [name] ([id number]) Active Problems ICD-10 [phone] - Non-pressure chronic ulcer of right calf with fat layer exposed I83.012 - Varicose veins of right lower extremity with ulcer of calf F17.218 - Nicotine dependence, cigarettes, with other nicotine-induced disorders M05.60 - Rheumatoid arthritis of unspecified site with involvement of other organs and systems Procedures Wound #1 Wound #1 is a Venous Leg Ulcer located on the Right,Anterior Lower Leg. A skin graft procedure using a bioengineered skin substitute/cellular or tissue based product was performed by Evlyn Kanner, MD. Apligraf was applied and secured with Steri-Strips. 12.5 sq cm of product was utilized and 31.5 sq cm was wasted. Post Application, MEPITEL was applied. A Time Out was conducted at 16:03, prior to the start of the procedure. The procedure was tolerated well with  a pain level of 0 throughout and a pain level of 0 following the procedure. Post procedure Diagnosis Wound #1: Same as Pre-Procedure . Plan Wound Cleansing: Wound #1 Right,Anterior Lower Leg: Clean wound with Normal Saline. Cleanse wound with mild soap and water Wound #2 Right,Posterior Knee: Clean wound with Normal Saline. Cleanse wound with mild soap and water Anesthetic: Wound #1 Right,Anterior Lower Leg: Topical Lidocaine 4% cream applied to wound bed prior to debridement Wound #2 Right,Posterior Knee: Topical Lidocaine 4% cream applied to wound bed prior to debridement Skin Barriers/Peri-Wound Care: Wound #1 Right,Anterior Lower Leg: Skin Prep Wound #2 Right,Posterior Knee: Skin Prep Primary Wound Dressing: Wound #1 Right,Anterior Lower Leg: [name], [name] ([id number]) Other: - Apligraph Wound #2 Right,Posterior Knee: Prisma Ag Secondary Dressing: Wound #1 Right,Anterior Lower Leg: ABD pad Dry Gauze Other - charcoal, mepitel, steri-strips Wound #2 Right,Posterior Knee: Dry Gauze Other - coverlet Dressing Change Frequency: Wound #1 Right,Anterior Lower Leg: Change dressing every week Wound #2 Right,Posterior Knee: Change dressing every other day. Follow-up Appointments: Wound #1 Right,Anterior Lower Leg: Return Appointment in 1 week. Nurse Visit as needed - next week Wound #2 Right,Posterior Knee: Return Appointment in 1 week. Nurse Visit as needed - next week Edema Control: Wound #1 Right,Anterior Lower Leg: 4-Layer Compression System - Right Lower Extremity - unna to anchor Elevate legs to the level of the heart and pump ankles as often as possible The wound base is clean and she has had her third application of Apligraf today which has been bolstered in place and a 4-layer compression will be used. we will see her back for review next week Electronic Signature(s) Signed: 03/20/2017 4:27:03 PM By: Evlyn Kanner MD, FACS Entered By: Evlyn Kanner on  03/20/2017 16:27:02 Riegler, [name] ([id number]) -------------------------------------------------------------------------------- SuperBill Details Patient Name: Marilyn Mins. Date of Service: 03/20/2017 Medical Record Number: [id number] Patient Account Number: [id number] Date of Birth/Sex: 17-Apr-1956 (61 y.o. Female) Treating RN: Riki Sheer Primary Care Provider: Noemi Chapel Other Clinician: Referring Provider: Noemi Chapel Treating Provider/Extender: Rudene Re in Treatment: 17 Diagnosis Coding ICD-10 Codes Code Description [phone] Non-pressure chronic ulcer of right calf with fat layer exposed I83.012 Varicose veins of right lower extremity with ulcer of calf F17.218 Nicotine dependence, cigarettes, with other nicotine-induced disorders M05.60 Rheumatoid arthritis of unspecified site with involvement of other organs and systems Facility Procedures CPT4 Code: [id number] Description: [phone] (Facility Use Only) Apligraf  44 SQ CM Modifier: Quantity: 44 CPT4 Code: [id number] Description: 15271 - SKIN SUB GRAFT TRNK/ARM/LEG ICD-10 Description Diagnosis 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

## 2017-03-21 NOTE — Progress Notes (Signed)
CORTASIA, SCREWS ([id number]) Visit Report for 03/20/2017 Arrival Information Details Patient Name: [name], [name]. Date of Service: 03/20/2017 3:30 PM Medical Record Number: [id number] Patient Account Number: [id number] Date of Birth/Sex: Aug 20, 1956 (61 y.o. Female) Treating RN: Carolyne Fiscal, Debi Primary Care Jaymar Loeber: Einar Gip Other Clinician: Referring Jasminne Mealy: Einar Gip Treating Keyli Duross/Extender: Frann Rider in Treatment: 39 Visit Information History Since Last Visit All ordered tests and consults were completed: No Patient Arrived: Ambulatory Added or deleted any medications: No Arrival Time: 15:37 Any new allergies or adverse reactions: No Accompanied By: self Had a fall or experienced change in No Transfer Assistance: None activities of daily living that may affect Patient Identification Verified: Yes risk of falls: Secondary Verification Process Yes Signs or symptoms of abuse/neglect since last No Completed: visito Patient Requires Transmission- No Hospitalized since last visit: No Based Precautions: Has Dressing in Place as Prescribed: Yes Patient Has Alerts: Yes Has Compression in Place as Prescribed: Yes Patient Alerts: L Leg ABI 1.2 Pain Present Now: No R Leg ABI 0.88 ABIs done in [place] Signature(s) Signed: 03/20/2017 5:04:07 PM By: Alric Quan Entered By: Alric Quan on 03/20/2017 15:38:51 Weinel, [name] ([id number]) -------------------------------------------------------------------------------- Encounter Discharge Information Details Patient Name: [name]. Date of Service: 03/20/2017 3:30 PM Medical Record Number: [id number] Patient Account Number: [id number] Date of Birth/Sex: 1956/06/03 (61 y.o. Female) Treating RN: Carolyne Fiscal, Debi Primary Care Tanasha Menees: Einar Gip Other Clinician: Referring Lizvette Lightsey: Einar Gip Treating Ainsleigh Kakos/Extender: Frann Rider in Treatment: 17 Encounter Discharge  Information Items Discharge Pain Level: 0 Discharge Condition: Stable Ambulatory Status: Ambulatory Discharge Destination: Home Transportation: Private Auto Accompanied By: SELF Schedule Follow-up Appointment: Yes Medication Reconciliation completed and provided to Patient/Care No Iseah Plouff: Provided on Clinical Summary of Care: 03/20/2017 Form Type Recipient Paper Patient JP Electronic Signature(s) Signed: 03/20/2017 5:04:07 PM By: Alric Quan Previous Signature: 03/20/2017 4:20:47 PM Version By: Ruthine Dose Entered By: Alric Quan on 03/20/2017 16:21:30 Yeats, [name] ([id number]) -------------------------------------------------------------------------------- Lower Extremity Assessment Details Patient Name: [name]. Date of Service: 03/20/2017 3:30 PM Medical Record Number: [id number] Patient Account Number: [id number] Date of Birth/Sex: [birth year] (61 y.o. Female) Treating RN: Carolyne Fiscal, Debi Primary Care Schelly Chuba: Einar Gip Other Clinician: Referring Shandel Busic: Einar Gip Treating Shatima Zalar/Extender: Frann Rider in Treatment: 17 Edema Assessment Assessed: [Left: No] [Right: No] E[Left: dema] [Right: :] Calf Left: Right: Point of Measurement: 32 cm From Medial Instep cm 44.2 cm Ankle Left: Right: Point of Measurement: 9 cm From Medial Instep cm 25.5 cm Vascular Assessment Pulses: Dorsalis Pedis Doppler Audible: [Right:Yes] Posterior Tibial Extremity colors, hair growth, and conditions: Extremity Color: [Right:Hyperpigmented] Temperature of Extremity: [Right:Warm] Capillary Refill: [Right:< 3 seconds] Toe Nail Assessment Left: Right: Thick: Yes Discolored: Yes Deformed: Yes Improper Length and Hygiene: Yes Electronic Signature(s) Signed: 03/20/2017 5:04:07 PM By: Alric Quan Entered By: Alric Quan on 03/20/2017 16:00:23 Pesnell, [name]  ([id number]) -------------------------------------------------------------------------------- Multi Wound Chart Details Patient Name: [name]. Date of Service: 03/20/2017 3:30 PM Medical Record Number: [id number] Patient Account Number: [id number] Date of Birth/Sex: 11-05-1956 (61 y.o. Female) Treating RN: Carolyne Fiscal, Debi Primary Care Amanat Hackel: Einar Gip Other Clinician: Referring Jasmyne Lodato: Einar Gip Treating Taj Arteaga/Extender: Frann Rider in Treatment: 17 Vital Signs Height(in): 63 Pulse(bpm): 80 Weight(lbs): 214.7 Blood Pressure 175/79 (mmHg): Body Mass Index(BMI): 38 Temperature(F): 98.1 Respiratory Rate 18 (breaths/min): Photos: [1:No Photos] [2:No Photos] [N/A:N/A] Wound Location: [1:Right Lower Leg - Anterior Right Knee - Posterior] [N/A:N/A] Wounding Event: [1:Gradually Appeared] [2:Shear/Friction] [N/A:N/A] Primary Etiology: [1:Venous Leg Ulcer] [2:Skin Tear] [  N/A:N/A] Comorbid History: [1:Chronic Obstructive Pulmonary Disease (COPD), Hypertension, Rheumatoid Arthritis] [2:Chronic Obstructive Pulmonary Disease (COPD), Hypertension, Rheumatoid Arthritis] [N/A:N/A] Date Acquired: [1:05/19/2016] [2:03/06/2017] [N/A:N/A] Weeks of Treatment: [1:17] [2:2] [N/A:N/A] Wound Status: [1:Open] [2:Open] [N/A:N/A] Measurements L x W x D 3.5x3.5x0.1 [2:0.5x1x0.1] [N/A:N/A] (cm) Area (cm) : [1:9.621] [2:0.393] [N/A:N/A] Volume (cm) : [1:0.962] [2:0.039] [N/A:N/A] % Reduction in Area: [1:46.20%] [2:28.50%] [N/A:N/A] % Reduction in Volume: 46.30% [2:29.10%] [N/A:N/A] Classification: [1:Full Thickness Without Exposed Support Structures] [2:Partial Thickness] [N/A:N/A] Exudate Amount: [1:Large] [2:Medium] [N/A:N/A] Exudate Type: [1:Serosanguineous] [2:Serosanguineous] [N/A:N/A] Exudate Color: [1:red, brown] [2:red, brown] [N/A:N/A] Wound Margin: [1:Distinct, outline attached Distinct, outline attached] [N/A:N/A] Granulation Amount: [1:Large (67-100%)] [2:Large  (67-100%)] [N/A:N/A] Granulation Quality: [1:Red, Hyper-granulation] [2:Red] [N/A:N/A] Necrotic Amount: [1:Small (1-33%)] [2:None Present (0%)] [N/A:N/A] Exposed Structures: [1:Fat Layer (Subcutaneous Fascia: No Tissue) Exposed: Yes Fascia: No] [2:Fat Layer (Subcutaneous Tissue) Exposed: No] [N/A:N/A] Tendon: No Tendon: No Muscle: No Muscle: No Joint: No Joint: No Bone: No Bone: No Limited to Skin Breakdown Epithelialization: Small (1-33%) None N/A Periwound Skin Texture: Excoriation: Yes No Abnormalities Noted N/A Scarring: Yes Periwound Skin No Abnormalities Noted No Abnormalities Noted N/A Moisture: Periwound Skin Color: Ecchymosis: Yes No Abnormalities Noted N/A Erythema: Yes Erythema Location: Circumferential N/A N/A Temperature: No Abnormality No Abnormality N/A Tenderness on Yes Yes N/A Palpation: Wound Preparation: Ulcer Cleansing: Ulcer Cleansing: N/A Rinsed/Irrigated with Rinsed/Irrigated with Saline, Other: soap and Saline water Topical Anesthetic Topical Anesthetic Applied: Other: lidocaine Applied: Other: lidocaine 4% 4% Procedures Performed: Cellular or Tissue Based N/A N/A Product Treatment Notes Wound #1 (Right, Anterior Lower Leg) 1. Cleansed with: Clean wound with Normal Saline Cleanse wound with antibacterial soap and water 2. Anesthetic Topical Lidocaine 4% cream to wound bed prior to debridement 4. Dressing Applied: Other dressing (specify in notes) 5. Secondary Dressing Applied ABD Pad Dry Gauze 7. Secured with Tape 4-Layer Compression System - Right Lower Extremity Notes unna to anchor, charcoal, DRAWTEX, APLIGRAPH Wound #2 (Right, Posterior Knee) 1. Cleansed with: MERIEL, KELLIHER. ([id number]) Clean wound with Normal Saline 2. Anesthetic Topical Lidocaine 4% cream to wound bed prior to debridement 4. Dressing Applied: Prisma Ag 5. Secondary Dressing Applied Dry Gauze Notes coverlet Electronic Signature(s) Signed: 03/20/2017  4:24:32 PM By: Christin Fudge MD, FACS Entered By: Christin Fudge on 03/20/2017 16:24:32 Tisdell, [name] ([id number]) -------------------------------------------------------------------------------- [place] Details Patient Name: ROZALYN, OSLAND. Date of Service: 03/20/2017 3:30 PM Medical Record Number: [id number] Patient Account Number: [id number] Date of Birth/Sex: 11/04/1956 (61 y.o. Female) Treating RN: Carolyne Fiscal, Debi Primary Care Escher Harr: Einar Gip Other Clinician: Referring Maili Shutters: Einar Gip Treating Kiya Eno/Extender: Frann Rider in Treatment: 17 Active Inactive ` Abuse / Safety / Falls / Self Care Management Nursing Diagnoses: Potential for falls Goals: Patient will remain injury free Date Initiated: 11/18/2016 Target Resolution Date: 01/20/2017 Goal Status: Active Interventions: Assess fall risk on admission and as needed Assess self care needs on admission and as needed Notes: ` Nutrition Nursing Diagnoses: Imbalanced nutrition Goals: Patient/caregiver agrees to and verbalizes understanding of need to use nutritional supplements and/or vitamins as prescribed Date Initiated: 11/18/2016 Target Resolution Date: 01/20/2017 Goal Status: Active Interventions: Assess patient nutrition upon admission and as needed per policy Notes: ` Orientation to the Wound Care Program Nursing Diagnoses: KALENNA, MILLETT ([id number]) Knowledge deficit related to the wound healing center program Goals: Patient/caregiver will verbalize understanding of the [facility] Date Initiated: 11/18/2016 Target Resolution Date: 01/20/2017 Goal Status: Active Interventions: Provide education on orientation to the wound center Notes: `  Pain, Acute or Chronic Nursing Diagnoses: Pain, acute or chronic: actual or potential Potential alteration in comfort, pain Goals: Patient will verbalize adequate pain control and receive pain control  interventions during procedures as needed Date Initiated: 11/18/2016 Target Resolution Date: 01/20/2017 Goal Status: Active Patient/caregiver will verbalize adequate pain control between visits Date Initiated: 11/18/2016 Target Resolution Date: 01/20/2017 Goal Status: Active Patient/caregiver will verbalize comfort level met Date Initiated: 11/18/2016 Target Resolution Date: 01/20/2017 Goal Status: Active Interventions: Assess comfort goal upon admission Complete pain assessment as per visit requirements Notes: ` Soft Tissue Infection Nursing Diagnoses: Impaired tissue integrity Knowledge deficit related to disease process and management Knowledge deficit related to home infection control: handwashing, handling of soiled dressings, supply storage Goals: RAYLIE, MADDISON ([id number]) Patient/caregiver will verbalize understanding of or measures to prevent infection and contamination in the home setting Date Initiated: 11/18/2016 Target Resolution Date: 01/20/2017 Goal Status: Active Patient's soft tissue infection will resolve Date Initiated: 11/18/2016 Target Resolution Date: 01/20/2017 Goal Status: Active Interventions: Assess signs and symptoms of infection every visit Provide education on infection Treatment Activities: Education provided on Infection : 11/18/2016 Notes: ` Wound/Skin Impairment Nursing Diagnoses: Impaired tissue integrity Goals: Ulcer/skin breakdown will have a volume reduction of 30% by week 4 Date Initiated: 11/18/2016 Target Resolution Date: 01/20/2017 Goal Status: Active Ulcer/skin breakdown will have a volume reduction of 50% by week 8 Date Initiated: 11/18/2016 Target Resolution Date: 01/20/2017 Goal Status: Active Ulcer/skin breakdown will have a volume reduction of 80% by week 12 Date Initiated: 11/18/2016 Target Resolution Date: 01/20/2017 Goal Status: Active Interventions: Assess patient/caregiver ability to perform ulcer/skin care regimen upon  admission and as needed Assess ulceration(s) every visit Provide education on smoking Notes: Electronic Signature(s) Signed: 03/20/2017 5:04:07 PM By: Alric Quan Entered By: Alric Quan on 03/20/2017 16:00:34 Clites, [name] ([id number]) -------------------------------------------------------------------------------- Pain Assessment Details Patient Name: [name]. Date of Service: 03/20/2017 3:30 PM Medical Record Number: [id number] Patient Account Number: [id number] Date of Birth/Sex: 06/26/1956 (61 y.o. Female) Treating RN: Carolyne Fiscal, Debi Primary Care Cristie Mckinney: Einar Gip Other Clinician: Referring Leticia Mcdiarmid: Einar Gip Treating Alayzia Pavlock/Extender: Frann Rider in Treatment: 17 Active Problems Location of Pain Severity and Description of Pain Patient Has Paino No Site Locations With Dressing Change: No Pain Management and Medication Current Pain Management: Electronic Signature(s) Signed: 03/20/2017 5:04:07 PM By: Alric Quan Entered By: Alric Quan on 03/20/2017 15:38:56 Fairburn, [name] ([id number]) -------------------------------------------------------------------------------- Patient/Caregiver Education Details Patient Name: [name]. Date of Service: 03/20/2017 3:30 PM Medical Record Number: [id number] Patient Account Number: [id number] Date of Birth/Gender: 01/04/1956 (61 y.o. Female) Treating RN: Carolyne Fiscal, Debi Primary Care Physician: Einar Gip Other Clinician: Referring Physician: Einar Gip Treating Physician/Extender: Frann Rider in Treatment: 17 Education Assessment Education Provided To: Patient Education Topics Provided Wound/Skin Impairment: Handouts: Other: CHANGE DRESSING AS ORDERED Methods: Demonstration, Explain/Verbal Responses: State content correctly Electronic Signature(s) Signed: 03/20/2017 5:04:07 PM By: Alric Quan Entered By: Alric Quan on 03/20/2017 16:21:47 Guion,  [name] ([id number]) -------------------------------------------------------------------------------- Wound Assessment Details Patient Name: [name]. Date of Service: 03/20/2017 3:30 PM Medical Record Number: [id number] Patient Account Number: [id number] Date of Birth/Sex: 02-Dec-1956 (61 y.o. Female) Treating RN: Carolyne Fiscal, Debi Primary Care Shara Hartis: Einar Gip Other Clinician: Referring Reneka Nebergall: Einar Gip Treating Reyaansh Merlo/Extender: Frann Rider in Treatment: 17 Wound Status Wound Number: 1 Primary Venous Leg Ulcer Etiology: Wound Location: Right Lower Leg - Anterior Wound Open Wounding Event: Gradually Appeared Status: Date Acquired: 05/19/2016 Comorbid Chronic Obstructive Pulmonary Weeks Of Treatment: 17 History: Disease (  COPD), Hypertension, Clustered Wound: No Rheumatoid Arthritis Photos Photo Uploaded By: Alric Quan on 03/20/2017 16:51:10 Wound Measurements Length: (cm) 3.5 Width: (cm) 3.5 Depth: (cm) 0.1 Area: (cm) 9.621 Volume: (cm) 0.962 % Reduction in Area: 46.2% % Reduction in Volume: 46.3% Epithelialization: Small (1-33%) Tunneling: No Undermining: No Wound Description Full Thickness Without Exposed Classification: Support Structures Wound Margin: Distinct, outline attached Exudate Large Amount: Exudate Type: Serosanguineous Exudate Color: red, brown Foul Odor After Cleansing: No Slough/Fibrino Yes Wound Bed Granulation Amount: Large (67-100%) Exposed Structure Granulation Quality: Red, Hyper-granulation Fascia Exposed: No Russomanno, Krystn K. ([id number]) Necrotic Amount: Small (1-33%) Fat Layer (Subcutaneous Tissue) Exposed: Yes Necrotic Quality: Adherent Slough Tendon Exposed: No Muscle Exposed: No Joint Exposed: No Bone Exposed: No Periwound Skin Texture Texture Color No Abnormalities Noted: No No Abnormalities Noted: No Excoriation: Yes Ecchymosis: Yes Scarring: Yes Erythema: Yes Erythema Location:  Circumferential Moisture No Abnormalities Noted: No Temperature / Pain Temperature: No Abnormality Tenderness on Palpation: Yes Wound Preparation Ulcer Cleansing: Rinsed/Irrigated with Saline, Other: soap and water, Topical Anesthetic Applied: Other: lidocaine 4%, Treatment Notes Wound #1 (Right, Anterior Lower Leg) 1. Cleansed with: Clean wound with Normal Saline Cleanse wound with antibacterial soap and water 2. Anesthetic Topical Lidocaine 4% cream to wound bed prior to debridement 4. Dressing Applied: Other dressing (specify in notes) 5. Secondary Dressing Applied ABD Pad Dry Gauze 7. Secured with Tape 4-Layer Compression System - Right Lower Extremity Notes unna to anchor, charcoal, DRAWTEX, APLIGRAPH Electronic Signature(s) Signed: 03/20/2017 5:04:07 PM By: Alric Quan Entered By: Alric Quan on 03/20/2017 15:56:23 Pryce, [name] ([id number]) -------------------------------------------------------------------------------- Wound Assessment Details Patient Name: [name]. Date of Service: 03/20/2017 3:30 PM Medical Record Number: [id number] Patient Account Number: [id number] Date of Birth/Sex: 01-28-1956 (61 y.o. Female) Treating RN: Carolyne Fiscal, Debi Primary Care Anjeanette Petzold: Einar Gip Other Clinician: Referring Karron Goens: Einar Gip Treating Caitrin Pendergraph/Extender: Frann Rider in Treatment: 17 Wound Status Wound Number: 2 Primary Skin Tear Etiology: Wound Location: Right Knee - Posterior Wound Open Wounding Event: Shear/Friction Status: Date Acquired: 03/06/2017 Comorbid Chronic Obstructive Pulmonary Weeks Of Treatment: 2 History: Disease (COPD), Hypertension, Clustered Wound: No Rheumatoid Arthritis Photos Photo Uploaded By: Alric Quan on 03/20/2017 16:51:11 Wound Measurements Length: (cm) 0.5 Width: (cm) 1 Depth: (cm) 0.1 Area: (cm) 0.393 Volume: (cm) 0.039 % Reduction in Area: 28.5% % Reduction in Volume:  29.1% Epithelialization: None Tunneling: No Undermining: No Wound Description Classification: Partial Thickness Foul Odor Af Wound Margin: Distinct, outline attached Slough/Fibri Exudate Amount: Medium Exudate Type: Serosanguineous Exudate Color: red, brown ter Cleansing: No no No Wound Bed Granulation Amount: Large (67-100%) Exposed Structure Granulation Quality: Red Fascia Exposed: No Necrotic Amount: None Present (0%) Fat Layer (Subcutaneous Tissue) Exposed: No Tendon Exposed: No Mangus, Danaysha K. ([id number]) Muscle Exposed: No Joint Exposed: No Bone Exposed: No Limited to Skin Breakdown Periwound Skin Texture Texture Color No Abnormalities Noted: No No Abnormalities Noted: No Moisture Temperature / Pain No Abnormalities Noted: No Temperature: No Abnormality Tenderness on Palpation: Yes Wound Preparation Ulcer Cleansing: Rinsed/Irrigated with Saline Topical Anesthetic Applied: Other: lidocaine 4%, Treatment Notes Wound #2 (Right, Posterior Knee) 1. Cleansed with: Clean wound with Normal Saline 2. Anesthetic Topical Lidocaine 4% cream to wound bed prior to debridement 4. Dressing Applied: Prisma Ag 5. Secondary Dressing Applied Dry Gauze Notes coverlet Electronic Signature(s) Signed: 03/20/2017 5:04:07 PM By: Alric Quan Entered By: Alric Quan on 03/20/2017 15:54:59 Christensen, [name] ([id number]) -------------------------------------------------------------------------------- [place] Details Patient Name: [name] Date of Service: 03/20/2017 3:30 PM Medical Record  Number: [id number] Patient Account Number: [id number] Date of Birth/Sex: 12-Nov-1956 (61 y.o. Female) Treating RN: Carolyne Fiscal, Debi Primary Care Christopherjame Carnell: Einar Gip Other Clinician: Referring Barak Bialecki: Einar Gip Treating Libero Puthoff/Extender: Frann Rider in Treatment: 17 Vital Signs Time Taken: 15:39 Temperature (F): 98.1 Height (in): 63 Pulse (bpm): 80 Weight  (lbs): 214.7 Respiratory Rate (breaths/min): 18 Body Mass Index (BMI): 38 Blood Pressure (mmHg): 175/79 Reference Range: 80 - 120 mg / dl Electronic Signature(s) Signed: 03/20/2017 5:04:07 PM By: Alric Quan Entered By: Alric Quan on 03/20/2017 15:40:56

## 2017-03-27 ENCOUNTER — Encounter: Payer: BLUE CROSS/BLUE SHIELD | Admitting: Surgery

## 2017-03-27 DIAGNOSIS — I83012 Varicose veins of right lower extremity with ulcer of calf: Secondary | ICD-10-CM | POA: Diagnosis not present

## 2017-03-28 NOTE — Progress Notes (Addendum)
ROSALITA, CAREY ([id number]) Visit Report for 03/27/2017 Chief Complaint Document Details Patient Name: [name], [name]. Date of Service: 03/27/2017 9:45 AM Medical Record Number: [id number] Patient Account Number: [id number] Date of Birth/Sex: 05/02/1956 (61 y.o. Female) Treating RN: Ashok Cordia, Debi Primary Care Provider: Noemi Chapel Other Clinician: Referring Provider: Noemi Chapel Treating Provider/Extender: Rudene Re in Treatment: 18 Information Obtained from: Patient Chief Complaint Patient presents for treatment of an open ulcer due to venous insufficiency to her right lower extremity which she's had for over 6 months Electronic Signature(s) Signed: 03/27/2017 10:18:17 AM By: Evlyn Kanner MD, FACS Entered By: Evlyn Kanner on 03/27/2017 10:18:17 Derusha, [name] ([id number]) -------------------------------------------------------------------------------- HPI Details Patient Name: [name]. Date of Service: 03/27/2017 9:45 AM Medical Record Number: [id number] Patient Account Number: [id number] Date of Birth/Sex: Oct 27, 1956 (61 y.o. Female) Treating RN: Ashok Cordia, Debi Primary Care Provider: Noemi Chapel Other Clinician: Referring Provider: Noemi Chapel Treating Provider/Extender: Rudene Re in Treatment: 18 History of Present Illness Location: ulcerated area on the right lower extremity Quality: Patient reports experiencing a sharp pain to affected area(s). Severity: Patient states wound are getting worse. Duration: Patient has had the wound for > 6 months prior to seeking treatment at the wound center Timing: Pain in wound is constant (hurts all the time) Context: The wound appeared gradually over time Modifying Factors: Other treatment(s) tried include:recent admission to hospital for a cellulitis and was treated with antibiotics and an Unna boot Associated Signs and Symptoms: Patient reports having increase swelling. HPI Description:  61 year old patient who was recently admitted to the hospital between 11/14/2016 and 11/15/2016 for right lower extremity ulcer with cellulitis. she was asked to stop clindamycin and doxycycline and her prednisone and take Levaquin 500 mg daily by mouth. she was treated for a right lower extremity possibly due to venous stasis versus vascular insufficiency versus infection. She received IV vancomycin during her hospital stay. He is known to have rheumatoid arthritis and stopped taking her treatment because of lack of insurance. during her hospital stay vascular studies were done -- ABIs were 0.88 on the right and 1.1 on the left, she had triphasic flow on both lower extremities and a plain film of the right tibia and fibula showed no signs of osteomyelitis. She is not a diabetic. She is a smoker however and continued to smoke. lab work noted that her sedimentation rate was 29, CRP was less than 0.8, hemoglobin A1c was 5.2 12/02/2016 -- due to a family emergency and social economic reasons she has been unable to keep her appointment with the vascular department for her workup and is expecting insurance coverage after January 1 12/19/2016 -- she continues to smoke. As have new insurance and hopefully will get her vascular test done as soon as possible. 12/26/2016 -- she has quit smoking for the last 3 days and I have commended her. She did receive her appointment from vein and vascular [place] and it is later this month 01/13/2017 -- she has not been back to see [place] since January 11 and had her compression wrap on all these days almost 3 weeks. I have sternly cautioned her regarding this. Her appointment for a venous duplex study is next Thursday. 01/23/2017 ---- lower extremity venous duplex reflux evaluation shows small saphenous vein is competent but there is evidence of great saphenous vein reflux in the right lower extremity with no evidence of DVT or SVT. There is also deep vein reflux in  the right lower extremity. A vascular consult was  recommended. Note the left side was not studied. Lower extremity arterial evaluation was done -- unable to obtain right dorsalis pedis or posterior tibial pressures due to an open wound. Posterior tibial and dorsalis pedis waveform appears strong and triphasic. Right TBI was 0.88. Left ABI was 1.21. LARIE, MATHES ([id number]) 01/30/2017 -- he has an appointment to see the vascular surgeons at [facility] this coming Thursday. her Apligraf has been authorized by her [employer] and we will get this for her for next week. 2/22 2018 -- she was seen by Dr. Tawanna Cooler Early this morning -- she was found to have severe CEAP class VI venous stasis disease and had recommended laser ablation of her right great saphenous vein and stab phlebectomy's of multiple tributary varicosities to recent venous hypertension and improve her wound healing and reduce the risk of recurrent ulceration. She has had her first application of Apligraf today. 02/13/2017 -- her surgery has been scheduled for next Thursday and hence we will not see her to the Monday after. He will be prepared to apply an Apligraf on that day 02/24/2017 -- her surgery last Thursday was canceled and it has been scheduled for next Thursday. 03/06/2017 -- was seen today by Dr. Tawanna Cooler Early who reviewed a duplex which shows thrombosis of the great saphenous vein with no evidence of DVT -- He recommended she should continue with the wound clinic and would see her back on an as-needed basis. For various reasons the patient has not been here regularly, as been a month sincee her last application of Apligraf. Today she is had a second application of the Apligraf. 03/20/2017 -- he is here for her third application of Apligraf Electronic Signature(s) Signed: 03/27/2017 10:18:52 AM By: Evlyn Kanner MD, FACS Entered By: Evlyn Kanner on 03/27/2017 10:18:51 Mcadams, [name]  ([id number]) -------------------------------------------------------------------------------- Physical Exam Details Patient Name: [name]. Date of Service: 03/27/2017 9:45 AM Medical Record Number: [id number] Patient Account Number: [id number] Date of Birth/Sex: 04-24-1956 (61 y.o. Female) Treating RN: Ashok Cordia, Debi Primary Care Provider: Noemi Chapel Other Clinician: Referring Provider: Noemi Chapel Treating Provider/Extender: Rudene Re in Treatment: 18 Constitutional . Pulse regular. Respirations normal and unlabored. Afebrile. . Eyes Nonicteric. Reactive to light. Ears, Nose, Mouth, and Throat Lips, teeth, and gums WNL.Marland Kitchen Moist mucosa without lesions. Neck supple and nontender. No palpable supraclavicular or cervical adenopathy. Normal sized without goiter. Respiratory WNL. No retractions.. Breath sounds WNL, No rubs, rales, rhonchi, or wheeze.. Cardiovascular Heart rhythm and rate regular, no murmur or gallop.. Pedal Pulses WNL. No clubbing, cyanosis or edema. Chest Breasts symmetical and no nipple discharge.. Breast tissue WNL, no masses, lumps, or tenderness.. Lymphatic No adneopathy. No adenopathy. No adenopathy. Musculoskeletal Adexa without tenderness or enlargement.. Digits and nails w/o clubbing, cyanosis, infection, petechiae, ischemia, or inflammatory conditions.. Integumentary (Hair, Skin) No suspicious lesions. No crepitus or fluctuance. No peri-wound warmth or erythema. No masses.Marland Kitchen Psychiatric Judgement and insight Intact.. No evidence of depression, anxiety, or agitation.. Notes the lymphedema is well controlled and the Apligraf is in position and there is much less maceration of the surrounding skin. We will continue with local care and a 4-layer compression Electronic Signature(s) Signed: 03/27/2017 10:19:23 AM By: Evlyn Kanner MD, FACS Entered By: Evlyn Kanner on 03/27/2017 10:19:21 Wehrenberg, [name]  ([id number]) -------------------------------------------------------------------------------- Physician Orders Details Patient Name: [name]. Date of Service: 03/27/2017 9:45 AM Medical Record Number: [id number] Patient Account Number: [id number] Date of Birth/Sex: 1956/05/22 (61 y.o. Female) Treating RN: Phillis Haggis Primary Care Provider:  Noemi Chapel Other Clinician: Referring Provider: Noemi Chapel Treating Provider/Extender: Rudene Re in Treatment: 41 Verbal / Phone Orders: Yes Clinician: Ashok Cordia, Debi Read Back and Verified: Yes Diagnosis Coding ICD-10 Coding Code Description [phone] Non-pressure chronic ulcer of right calf with fat layer exposed I83.012 Varicose veins of right lower extremity with ulcer of calf F17.218 Nicotine dependence, cigarettes, with other nicotine-induced disorders M05.60 Rheumatoid arthritis of unspecified site with involvement of other organs and systems Wound Cleansing Wound #1 Right,Anterior Lower Leg o Clean wound with Normal Saline. o Cleanse wound with mild soap and water Wound #2 Right,Posterior Knee o Clean wound with Normal Saline. o Cleanse wound with mild soap and water Anesthetic Wound #1 Right,Anterior Lower Leg o Topical Lidocaine 4% cream applied to wound bed prior to debridement Wound #2 Right,Posterior Knee o Topical Lidocaine 4% cream applied to wound bed prior to debridement Skin Barriers/Peri-Wound Care Wound #1 Right,Anterior Lower Leg o Skin Prep Wound #2 Right,Posterior Knee o Skin Prep Primary Wound Dressing Wound #1 Right,Anterior Lower Leg o Other: - Apligraph Wound #2 Right,Posterior Knee o Prisma Ag [name], [name] ([id number]) Secondary Dressing Wound #1 Right,Anterior Lower Leg o ABD pad o Dry Gauze o Other - charcoal, mepitel, steri-strips Wound #2 Right,Posterior Knee o Dry Gauze o Other - coverlet Dressing Change Frequency Wound #1  Right,Anterior Lower Leg o Change dressing every week Wound #2 Right,Posterior Knee o Change dressing every other day. Follow-up Appointments Wound #1 Right,Anterior Lower Leg o Return Appointment in 1 week. o Nurse Visit as needed - next week Wound #2 Right,Posterior Knee o Return Appointment in 1 week. o Nurse Visit as needed - next week Edema Control Wound #1 Right,Anterior Lower Leg o 4-Layer Compression System - Right Lower Extremity - unna to anchor o Elevate legs to the level of the heart and pump ankles as often as possible Electronic Signature(s) Signed: 03/27/2017 2:17:24 PM By: Evlyn Kanner MD, FACS Signed: 03/27/2017 4:33:22 PM By: Alejandro Mulling Entered By: Alejandro Mulling on 03/27/2017 10:24:24 Ungar, [name] ([id number]) -------------------------------------------------------------------------------- Problem List Details Patient Name: [name], [name]. Date of Service: 03/27/2017 9:45 AM Medical Record Number: [id number] Patient Account Number: [id number] Date of Birth/Sex: April 11, 1956 (61 y.o. Female) Treating RN: Ashok Cordia, Debi Primary Care Provider: Noemi Chapel Other Clinician: Referring Provider: Noemi Chapel Treating Provider/Extender: Rudene Re in Treatment: 18 Active Problems ICD-10 Encounter Code Description Active Date Diagnosis 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 layer 11/18/2016 Yes exposed I83.012 Varicose veins of right lower extremity with ulcer of calf 11/18/2016 Yes F17.218 Nicotine dependence, cigarettes, with other nicotine- 11/18/2016 Yes induced disorders M05.60 Rheumatoid arthritis of unspecified site with involvement 11/18/2016 Yes of other organs and systems Inactive Problems Resolved Problems Electronic Signature(s) Signed: 03/27/2017 10:17:58 AM By: Evlyn Kanner MD, FACS Entered By: Evlyn Kanner on 03/27/2017 10:17:58 Burn[place], [name]  ([id number]) -------------------------------------------------------------------------------- Progress Note Details Patient Name: [name]. Date of Service: 03/27/2017 9:45 AM Medical Record Number: [id number] Patient Account Number: [id number] Date of Birth/Sex: 04/16/1956 (61 y.o. Female) Treating RN: Ashok Cordia, Debi Primary Care Provider: Noemi Chapel Other Clinician: Referring Provider: Noemi Chapel Treating Provider/Extender: Rudene Re in Treatment: 18 Subjective Chief Complaint Information obtained from Patient Patient presents for treatment of an open ulcer due to venous insufficiency to her right lower extremity which she's had for over 6 months History of Present Illness (HPI) The following HPI elements were documented for the patient's wound: Location: ulcerated area on the right lower extremity Quality: Patient reports  experiencing a sharp pain to affected area(s). Severity: Patient states wound are getting worse. Duration: Patient has had the wound for > 6 months prior to seeking treatment at the wound center Timing: Pain in wound is constant (hurts all the time) Context: The wound appeared gradually over time Modifying Factors: Other treatment(s) tried include:recent admission to hospital for a cellulitis and was treated with antibiotics and an Unna boot Associated Signs and Symptoms: Patient reports having increase swelling. 61 year old patient who was recently admitted to the hospital between 11/14/2016 and 11/15/2016 for right lower extremity ulcer with cellulitis. she was asked to stop clindamycin and doxycycline and her prednisone and take Levaquin 500 mg daily by mouth. she was treated for a right lower extremity possibly due to venous stasis versus vascular insufficiency versus infection. She received IV vancomycin during her hospital stay. He is known to have rheumatoid arthritis and stopped taking her treatment because of lack  of insurance. during her hospital stay vascular studies were done -- ABIs were 0.88 on the right and 1.1 on the left, she had triphasic flow on both lower extremities and a plain film of the right tibia and fibula showed no signs of osteomyelitis. She is not a diabetic. She is a smoker however and continued to smoke. lab work noted that her sedimentation rate was 29, CRP was less than 0.8, hemoglobin A1c was 5.2 12/02/2016 -- due to a family emergency and social economic reasons she has been unable to keep her appointment with the vascular department for her workup and is expecting insurance coverage after January 1 12/19/2016 -- she continues to smoke. As have new insurance and hopefully will get her vascular test done as soon as possible. 12/26/2016 -- she has quit smoking for the last 3 days and I have commended her. She did receive her appointment from vein and vascular [place] and it is later this month 01/13/2017 -- she has not been back to see [place] since January 11 and had her compression wrap on all these days almost 3 weeks. I have sternly cautioned her regarding this. Her appointment for a venous [name], [name]. ([id number]) duplex study is next Thursday. 01/23/2017 ---- lower extremity venous duplex reflux evaluation shows small saphenous vein is competent but there is evidence of great saphenous vein reflux in the right lower extremity with no evidence of DVT or SVT. There is also deep vein reflux in the right lower extremity. A vascular consult was recommended. Note the left side was not studied. Lower extremity arterial evaluation was done -- unable to obtain right dorsalis pedis or posterior tibial pressures due to an open wound. Posterior tibial and dorsalis pedis waveform appears strong and triphasic. Right TBI was 0.88. Left ABI was 1.21. 01/30/2017 -- he has an appointment to see the vascular surgeons at [facility] this coming Thursday. her Apligraf has been authorized  by her [employer] and we will get this for her for next week. 2/22 2018 -- she was seen by Dr. Tawanna Cooler Early this morning -- she was found to have severe CEAP class VI venous stasis disease and had recommended laser ablation of her right great saphenous vein and stab phlebectomy's of multiple tributary varicosities to recent venous hypertension and improve her wound healing and reduce the risk of recurrent ulceration. She has had her first application of Apligraf today. 02/13/2017 -- her surgery has been scheduled for next Thursday and hence we will not see her to the Monday after. He will be prepared to apply  an Apligraf on that day 02/24/2017 -- her surgery last Thursday was canceled and it has been scheduled for next Thursday. 03/06/2017 -- was seen today by Dr. Tawanna Cooler Early who reviewed a duplex which shows thrombosis of the great saphenous vein with no evidence of DVT -- He recommended she should continue with the wound clinic and would see her back on an as-needed basis. For various reasons the patient has not been here regularly, as been a month sincee her last application of Apligraf. Today she is had a second application of the Apligraf. 03/20/2017 -- he is here for her third application of Apligraf Objective Constitutional Pulse regular. Respirations normal and unlabored. Afebrile. Vitals Time Taken: 10:00 AM, Height: 63 in, Weight: 214.7 lbs, BMI: 38, Pulse: 87 bpm, Respiratory Rate: 18 breaths/min, Blood Pressure: 163/75 mmHg. Eyes Nonicteric. Reactive to light. Ears, Nose, Mouth, and Throat [name], [name] ([id number]) Lips, teeth, and gums WNL.Marland Kitchen Moist mucosa without lesions. Neck supple and nontender. No palpable supraclavicular or cervical adenopathy. Normal sized without goiter. Respiratory WNL. No retractions.. Breath sounds WNL, No rubs, rales, rhonchi, or wheeze.. Cardiovascular Heart rhythm and rate regular, no murmur or gallop.. Pedal Pulses WNL. No clubbing,  cyanosis or edema. Chest Breasts symmetical and no nipple discharge.. Breast tissue WNL, no masses, lumps, or tenderness.. Lymphatic No adneopathy. No adenopathy. No adenopathy. Musculoskeletal Adexa without tenderness or enlargement.. Digits and nails w/o clubbing, cyanosis, infection, petechiae, ischemia, or inflammatory conditions.Marland Kitchen Psychiatric Judgement and insight Intact.. No evidence of depression, anxiety, or agitation.. General Notes: the lymphedema is well controlled and the Apligraf is in position and there is much less maceration of the surrounding skin. We will continue with local care and a 4-layer compression Integumentary (Hair, Skin) No suspicious lesions. No crepitus or fluctuance. No peri-wound warmth or erythema. No masses.. Wound #1 status is Open. Original cause of wound was Gradually Appeared. The wound is located on the Right,Anterior Lower Leg. The wound measures 3.5cm length x 3.5cm width x 0.1cm depth; 9.621cm^2 area and 0.962cm^3 volume. There is Fat Layer (Subcutaneous Tissue) Exposed exposed. There is no tunneling or undermining noted. There is a large amount of serosanguineous drainage noted. The wound margin is distinct with the outline attached to the wound base. There is large (67-100%) red granulation within the wound bed. There is a small (1-33%) amount of necrotic tissue within the wound bed including Adherent Slough. The periwound skin appearance exhibited: Excoriation, Scarring, Ecchymosis, Erythema. The surrounding wound skin color is noted with erythema which is circumferential. Periwound temperature was noted as No Abnormality. The periwound has tenderness on palpation. General Notes: this visit is for a skin sub check all measurements same unable to view wound d/t dressing Wound #2 status is Open. Original cause of wound was Shear/Friction. The wound is located on the Right,Posterior Knee. The wound measures 0.4cm length x 1cm width x 0.1cm depth;  0.314cm^2 area and 0.031cm^3 volume. The wound is limited to skin breakdown. There is no tunneling or undermining noted. There is a medium amount of serosanguineous drainage noted. The wound margin is distinct with the outline attached to the wound base. There is large (67-100%) red granulation within the wound bed. There is no necrotic tissue within the wound bed. Periwound temperature was noted as No Abnormality. The periwound has tenderness on palpation. [name], [name] ([id number]) Assessment Active Problems ICD-10 [phone] - Non-pressure chronic ulcer of right calf with fat layer exposed I83.012 - Varicose veins of right lower extremity with ulcer of calf  F17.218 - Nicotine dependence, cigarettes, with other nicotine-induced disorders M05.60 - Rheumatoid arthritis of unspecified site with involvement of other organs and systems Plan Wound Cleansing: Wound #1 Right,Anterior Lower Leg: Clean wound with Normal Saline. Cleanse wound with mild soap and water Wound #2 Right,Posterior Knee: Clean wound with Normal Saline. Cleanse wound with mild soap and water Anesthetic: Wound #1 Right,Anterior Lower Leg: Topical Lidocaine 4% cream applied to wound bed prior to debridement Wound #2 Right,Posterior Knee: Topical Lidocaine 4% cream applied to wound bed prior to debridement Skin Barriers/Peri-Wound Care: Wound #1 Right,Anterior Lower Leg: Skin Prep Wound #2 Right,Posterior Knee: Skin Prep Primary Wound Dressing: Wound #1 Right,Anterior Lower Leg: Other: - Apligraph Wound #2 Right,Posterior Knee: Prisma Ag Secondary Dressing: Wound #1 Right,Anterior Lower Leg: ABD pad Dry Gauze Other - charcoal, mepitel, steri-strips Wound #2 Right,Posterior Knee: Dry Gauze Other - coverlet Dressing Change Frequency: [name], [name]. ([id number]) Wound #1 Right,Anterior Lower Leg: Change dressing every week Wound #2 Right,Posterior Knee: Change dressing every other day. Follow-up  Appointments: Wound #1 Right,Anterior Lower Leg: Return Appointment in 1 week. Nurse Visit as needed - next week Wound #2 Right,Posterior Knee: Return Appointment in 1 week. Nurse Visit as needed - next week Edema Control: Wound #1 Right,Anterior Lower Leg: 4-Layer Compression System - Right Lower Extremity - unna to anchor Elevate legs to the level of the heart and pump ankles as often as possible The wound base is clean and the dressing with Apligraf is intact and it has been bolstered in place and a 4-layer compression will be used. we will see her back for review next week for her fourth Apligraf. Electronic Signature(s) Signed: 03/27/2017 2:19:27 PM By: Evlyn Kanner MD, FACS Previous Signature: 03/27/2017 10:20:39 AM Version By: Evlyn Kanner MD, FACS Entered By: Evlyn Kanner on 03/27/2017 14:19:26 Foor, [name] ([id number]) -------------------------------------------------------------------------------- SuperBill Details Patient Name: [name]. Date of Service: 03/27/2017 Medical Record Number: [id number] Patient Account Number: [id number] Date of Birth/Sex: 12/26/1955 (61 y.o. Female) Treating RN: Ashok Cordia, Debi Primary Care Provider: Noemi Chapel Other Clinician: Referring Provider: Noemi Chapel Treating Provider/Extender: Rudene Re in Treatment: 18 Diagnosis Coding ICD-10 Codes Code Description [phone] Non-pressure chronic ulcer of right calf with fat layer exposed I83.012 Varicose veins of right lower extremity with ulcer of calf F17.218 Nicotine dependence, cigarettes, with other nicotine-induced disorders M05.60 Rheumatoid arthritis of unspecified site with involvement of other organs and systems Facility Procedures CPT4: Description Modifier Quantity Code [id number] (Facility Use Only) [id number] - APPLY MULTLAY COMPRS LWR RT 1 LEG Physician Procedures CPT4: Description Modifier Quantity Code [id number] - WC PHYS LEVEL 3 - EST PT 1 ICD-10  Description Diagnosis 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 layer exposed I83.012 Varicose veins of right lower extremity with ulcer of calf  F17.218 Nicotine dependence, cigarettes, with other nicotine-induced disorders M05.60 Rheumatoid arthritis of unspecified site with involvement of other organs and systems Electronic Signature(s) Signed: 03/27/2017 2:17:24 PM By: Evlyn Kanner MD, FACS Signed: 03/27/2017 4:33:22 PM By: Alejandro Mulling Previous Signature: 03/27/2017 10:20:57 AM Version By: Evlyn Kanner MD, FACS Entered By: Alejandro Mulling on 03/27/2017 11:56:24

## 2017-03-30 NOTE — Progress Notes (Signed)
[name] ([id number]) Visit Report for 03/27/2017 Arrival Information Details Patient Name: [name], [name]. Date of Service: 03/27/2017 9:45 AM Medical Record Number: [id number] Patient Account Number: [id number] Date of Birth/Sex: [birth year] (61 y.o. Female) Treating RN: Carolyne Fiscal, Debi Primary Care Ellisyn Icenhower: Einar Gip Other Clinician: Referring Maury Groninger: Einar Gip Treating Elizabella Nolet/Extender: Frann Rider in Treatment: 18 Visit Information History Since Last Visit All ordered tests and consults were completed: No Patient Arrived: Ambulatory Added or deleted any medications: No Arrival Time: 09:57 Any new allergies or adverse reactions: No Accompanied By: self Had a fall or experienced change in No Transfer Assistance: None activities of daily living that may affect Patient Identification Verified: Yes risk of falls: Secondary Verification Process Yes Signs or symptoms of abuse/neglect since last No Completed: visito Patient Requires Transmission- No Hospitalized since last visit: No Based Precautions: Has Dressing in Place as Prescribed: Yes Patient Has Alerts: Yes Has Compression in Place as Prescribed: Yes Patient Alerts: L Leg ABI 1.2 Pain Present Now: Yes R Leg ABI 0.88 ABIs done in [place] Signature(s) Signed: 03/27/2017 4:33:22 PM By: Alric Quan Entered By: Alric Quan on 03/27/2017 09:57:33 Mackel, [name] ([id number]) -------------------------------------------------------------------------------- Complex / Palliative Patient Assessment Details Patient Name: [name]. Date of Service: 03/27/2017 9:45 AM Medical Record Number: [id number] Patient Account Number: [id number] Date of Birth/Sex: Feb 27, 1956 (61 y.o. Female) Treating RN: Cornell Barman Primary Care Leldon Steege: Einar Gip Other Clinician: Referring Deadrick Stidd: Einar Gip Treating Cathyann Kilfoyle/Extender: Frann Rider in Treatment: 18 Palliative  Management Criteria Complex Wound Management Criteria Patient has remarkable or complex co-morbidities requiring medications or treatments that extend wound healing times. Examples: o Diabetes mellitus with chronic renal failure or end stage renal disease requiring dialysis o Advanced or poorly controlled rheumatoid arthritis o Diabetes mellitus and end stage chronic obstructive pulmonary disease o Active cancer with current chemo- or radiation therapy COPD and RA Care Approach Wound Care Plan: Complex Wound Management Electronic Signature(s) Signed: 03/28/2017 11:12:25 AM By: Gretta Cool RN, BSN, Kim RN, BSN Signed: 03/28/2017 4:08:29 PM By: Christin Fudge MD, FACS Entered By: Gretta Cool, RN, BSN, Kim on 03/28/2017 11:12:25 Lyster, [name] ([id number]) -------------------------------------------------------------------------------- Encounter Discharge Information Details Patient Name: [name]. Date of Service: 03/27/2017 9:45 AM Medical Record Number: [id number] Patient Account Number: [id number] Date of Birth/Sex: 06-Jan-1956 (61 y.o. Female) Treating RN: Carolyne Fiscal, Debi Primary Care Jamileth Putzier: Einar Gip Other Clinician: Referring Blonnie Maske: Einar Gip Treating Mivaan Corbitt/Extender: Frann Rider in Treatment: 18 Encounter Discharge Information Items Discharge Pain Level: 0 Discharge Condition: Stable Ambulatory Status: Ambulatory Discharge Destination: Home Transportation: Private Auto Accompanied By: self Schedule Follow-up Appointment: Yes Medication Reconciliation completed and provided to Patient/Care No Lael Pilch: Provided on Clinical Summary of Care: 03/27/2017 Form Type Recipient Paper Patient JP Electronic Signature(s) Signed: 03/27/2017 10:25:10 AM By: Ruthine Dose Entered By: Ruthine Dose on 03/27/2017 10:25:09 [place], [name] ([id number]) -------------------------------------------------------------------------------- Lower Extremity  Assessment Details Patient Name: [name]. Date of Service: 03/27/2017 9:45 AM Medical Record Number: [id number] Patient Account Number: [id number] Date of Birth/Sex: Oct 14, 1956 (61 y.o. Female) Treating RN: Carolyne Fiscal, Debi Primary Care Terrion Gencarelli: Einar Gip Other Clinician: Referring Yazmine Sorey: Einar Gip Treating Doniqua Saxby/Extender: Frann Rider in Treatment: 18 Edema Assessment Assessed: [Left: No] [Right: No] E[Left: dema] [Right: :] Calf Left: Right: Point of Measurement: 32 cm From Medial Instep cm 44.2 cm Ankle Left: Right: Point of Measurement: 9 cm From Medial Instep cm 25.4 cm Vascular Assessment Pulses: Dorsalis Pedis Palpable: [Right:Yes] Posterior Tibial Extremity colors, hair growth, and conditions:  Extremity Color: [Right:Hyperpigmented] Temperature of Extremity: [Right:Warm] Capillary Refill: [Right:< 3 seconds] Toe Nail Assessment Left: Right: Thick: Yes Discolored: Yes Deformed: Yes Improper Length and Hygiene: Yes Electronic Signature(s) Signed: 03/27/2017 4:33:22 PM By: Alric Quan Entered By: Alric Quan on 03/27/2017 10:07:25 Libbey, [name] ([id number]) -------------------------------------------------------------------------------- Multi Wound Chart Details Patient Name: [name]. Date of Service: 03/27/2017 9:45 AM Medical Record Number: [id number] Patient Account Number: [id number] Date of Birth/Sex: 1956/09/07 (62 y.o. Female) Treating RN: Carolyne Fiscal, Debi Primary Care Zaria Taha: Einar Gip Other Clinician: Referring Elizjah Noblet: Einar Gip Treating Nirvi Boehler/Extender: Frann Rider in Treatment: 18 Vital Signs Height(in): 63 Pulse(bpm): 87 Weight(lbs): 214.7 Blood Pressure 163/75 (mmHg): Body Mass Index(BMI): 38 Temperature(F): Respiratory Rate 18 (breaths/min): Photos: [1:No Photos] [2:No Photos] [N/A:N/A] Wound Location: [1:Right Lower Leg - Anterior Right Knee - Posterior]  [N/A:N/A] Wounding Event: [1:Gradually Appeared] [2:Shear/Friction] [N/A:N/A] Primary Etiology: [1:Venous Leg Ulcer] [2:Skin Tear] [N/A:N/A] Comorbid History: [1:Chronic Obstructive Pulmonary Disease (COPD), Hypertension, Rheumatoid Arthritis] [2:Chronic Obstructive Pulmonary Disease (COPD), Hypertension, Rheumatoid Arthritis] [N/A:N/A] Date Acquired: [1:05/19/2016] [2:03/06/2017] [N/A:N/A] Weeks of Treatment: [1:18] [2:3] [N/A:N/A] Wound Status: [1:Open] [2:Open] [N/A:N/A] Measurements L x W x D 3.5x3.5x0.1 [2:0.4x1x0.1] [N/A:N/A] (cm) Area (cm) : [1:9.621] [2:0.314] [N/A:N/A] Volume (cm) : [1:0.962] [2:0.031] [N/A:N/A] % Reduction in Area: [1:46.20%] [2:42.90%] [N/A:N/A] % Reduction in Volume: 46.30% [2:43.60%] [N/A:N/A] Classification: [1:Full Thickness Without Exposed Support Structures] [2:Partial Thickness] [N/A:N/A] Exudate Amount: [1:Large] [2:Medium] [N/A:N/A] Exudate Type: [1:Serosanguineous] [2:Serosanguineous] [N/A:N/A] Exudate Color: [1:red, brown] [2:red, brown] [N/A:N/A] Wound Margin: [1:Distinct, outline attached Distinct, outline attached] [N/A:N/A] Granulation Amount: [1:Large (67-100%)] [2:Large (67-100%)] [N/A:N/A] Granulation Quality: [1:Red, Hyper-granulation] [2:Red] [N/A:N/A] Necrotic Amount: [1:Small (1-33%)] [2:None Present (0%)] [N/A:N/A] Exposed Structures: [1:Fat Layer (Subcutaneous Fascia: No Tissue) Exposed: Yes Fascia: No] [2:Fat Layer (Subcutaneous Tissue) Exposed: No] [N/A:N/A] Tendon: No Tendon: No Muscle: No Muscle: No Joint: No Joint: No Bone: No Bone: No Limited to Skin Breakdown Epithelialization: Small (1-33%) None N/A Periwound Skin Texture: Excoriation: Yes No Abnormalities Noted N/A Scarring: Yes Periwound Skin No Abnormalities Noted No Abnormalities Noted N/A Moisture: Periwound Skin Color: Ecchymosis: Yes No Abnormalities Noted N/A Erythema: Yes Erythema Location: Circumferential N/A N/A Temperature: No Abnormality No Abnormality  N/A Tenderness on Yes Yes N/A Palpation: Wound Preparation: Ulcer Cleansing: Ulcer Cleansing: N/A Rinsed/Irrigated with Rinsed/Irrigated with Saline, Other: soap and Saline water Topical Anesthetic Topical Anesthetic Applied: Other: lidocaine Applied: None, Other: 4% lidocaine 4% Assessment Notes: this visit is for a skin sub N/A N/A check all measurements same unable to view wound d/t dressing Treatment Notes Electronic Signature(s) Signed: 03/27/2017 10:18:03 AM By: Christin Fudge MD, FACS Entered By: Christin Fudge on 03/27/2017 10:18:03 Nicotra, [name] ([id number]) -------------------------------------------------------------------------------- [place] Details Patient Name: [name]. Date of Service: 03/27/2017 9:45 AM Medical Record Number: [id number] Patient Account Number: [id number] Date of Birth/Sex: 1956-09-25 (61 y.o. Female) Treating RN: Carolyne Fiscal, Debi Primary Care Koya Hunger: Einar Gip Other Clinician: Referring Ellyanna Holton: Einar Gip Treating Durk Carmen/Extender: Frann Rider in Treatment: 18 Active Inactive ` Abuse / Safety / Falls / Self Care Management Nursing Diagnoses: Potential for falls Goals: Patient will remain injury free Date Initiated: 11/18/2016 Target Resolution Date: 01/20/2017 Goal Status: Active Interventions: Assess fall risk on admission and as needed Assess self care needs on admission and as needed Notes: ` Nutrition Nursing Diagnoses: Imbalanced nutrition Goals: Patient/caregiver agrees to and verbalizes understanding of need to use nutritional supplements and/or vitamins as prescribed Date Initiated: 11/18/2016 Target Resolution Date: 01/20/2017 Goal Status: Active Interventions: Assess patient nutrition upon admission and as  needed per policy Notes: ` Orientation to the Wound Care Program Nursing Diagnoses: MERRIN, MCVICKER ([id number]) Knowledge deficit related to the wound healing  center program Goals: Patient/caregiver will verbalize understanding of the [place] Date Initiated: 11/18/2016 Target Resolution Date: 01/20/2017 Goal Status: Active Interventions: Provide education on orientation to the wound center Notes: ` Pain, Acute or Chronic Nursing Diagnoses: Pain, acute or chronic: actual or potential Potential alteration in comfort, pain Goals: Patient will verbalize adequate pain control and receive pain control interventions during procedures as needed Date Initiated: 11/18/2016 Target Resolution Date: 01/20/2017 Goal Status: Active Patient/caregiver will verbalize adequate pain control between visits Date Initiated: 11/18/2016 Target Resolution Date: 01/20/2017 Goal Status: Active Patient/caregiver will verbalize comfort level met Date Initiated: 11/18/2016 Target Resolution Date: 01/20/2017 Goal Status: Active Interventions: Assess comfort goal upon admission Complete pain assessment as per visit requirements Notes: ` Soft Tissue Infection Nursing Diagnoses: Impaired tissue integrity Knowledge deficit related to disease process and management Knowledge deficit related to home infection control: handwashing, handling of soiled dressings, supply storage Goals: TEMIKA, SUTPHIN ([id number]) Patient/caregiver will verbalize understanding of or measures to prevent infection and contamination in the home setting Date Initiated: 11/18/2016 Target Resolution Date: 01/20/2017 Goal Status: Active Patient's soft tissue infection will resolve Date Initiated: 11/18/2016 Target Resolution Date: 01/20/2017 Goal Status: Active Interventions: Assess signs and symptoms of infection every visit Provide education on infection Treatment Activities: Education provided on Infection : 12/26/2016 Notes: ` Wound/Skin Impairment Nursing Diagnoses: Impaired tissue integrity Goals: Ulcer/skin breakdown will have a volume reduction of 30% by week  4 Date Initiated: 11/18/2016 Target Resolution Date: 01/20/2017 Goal Status: Active Ulcer/skin breakdown will have a volume reduction of 50% by week 8 Date Initiated: 11/18/2016 Target Resolution Date: 01/20/2017 Goal Status: Active Ulcer/skin breakdown will have a volume reduction of 80% by week 12 Date Initiated: 11/18/2016 Target Resolution Date: 01/20/2017 Goal Status: Active Interventions: Assess patient/caregiver ability to perform ulcer/skin care regimen upon admission and as needed Assess ulceration(s) every visit Provide education on smoking Notes: Electronic Signature(s) Signed: 03/27/2017 4:33:22 PM By: Alric Quan Entered By: Alric Quan on 03/27/2017 10:08:47 Romanoski, [name] ([id number]) -------------------------------------------------------------------------------- Pain Assessment Details Patient Name: [name]. Date of Service: 03/27/2017 9:45 AM Medical Record Number: [id number] Patient Account Number: [id number] Date of Birth/Sex: 04-Sep-1956 (61 y.o. Female) Treating RN: Carolyne Fiscal, Debi Primary Care Beren Yniguez: Einar Gip Other Clinician: Referring Vianey Caniglia: Einar Gip Treating Delio Slates/Extender: Frann Rider in Treatment: 18 Active Problems Location of Pain Severity and Description of Pain Patient Has Paino Yes Site Locations Pain Location: Pain in Ulcers Rate the pain. Current Pain Level: 7 Character of Pain Describe the Pain: Aching Pain Management and Medication Current Pain Management: Electronic Signature(s) Signed: 03/27/2017 4:33:22 PM By: Alric Quan Entered By: Alric Quan on 03/27/2017 09:57:46 Summerall, [name] ([id number]) -------------------------------------------------------------------------------- Patient/Caregiver Education Details Patient Name: [name]. Date of Service: 03/27/2017 9:45 AM Medical Record Number: [id number] Patient Account Number: [id number] Date of Birth/Gender: August 31, 1956 (61  y.o. Female) Treating RN: Carolyne Fiscal, Debi Primary Care Physician: Einar Gip Other Clinician: Referring Physician: Einar Gip Treating Physician/Extender: Frann Rider in Treatment: 18 Education Assessment Education Provided To: Patient Education Topics Provided Wound/Skin Impairment: Handouts: Other: do not get wrap wet Methods: Demonstration, Explain/Verbal Responses: State content correctly Electronic Signature(s) Signed: 03/27/2017 4:33:22 PM By: Alric Quan Entered By: Alric Quan on 03/27/2017 10:09:21 Trinka, [name] ([id number]) -------------------------------------------------------------------------------- Wound Assessment Details Patient Name: [name]. Date of Service: 03/27/2017 9:45 AM Medical  Record Number: [id number] Patient Account Number: [id number] Date of Birth/Sex: 1955/12/18 (61 y.o. Female) Treating RN: Carolyne Fiscal, Debi Primary Care Jacqualyn Sedgwick: Einar Gip Other Clinician: Referring Mattia Osterman: Einar Gip Treating Nixon Sparr/Extender: Frann Rider in Treatment: 18 Wound Status Wound Number: 1 Primary Venous Leg Ulcer Etiology: Wound Location: Right Lower Leg - Anterior Wound Open Wounding Event: Gradually Appeared Status: Date Acquired: 05/19/2016 Comorbid Chronic Obstructive Pulmonary Weeks Of Treatment: 18 History: Disease (COPD), Hypertension, Clustered Wound: No Rheumatoid Arthritis Photos Photo Uploaded By: Alric Quan on 03/27/2017 16:04:43 Wound Measurements Length: (cm) 3.5 Width: (cm) 3.5 Depth: (cm) 0.1 Area: (cm) 9.621 Volume: (cm) 0.962 % Reduction in Area: 46.2% % Reduction in Volume: 46.3% Epithelialization: Small (1-33%) Tunneling: No Undermining: No Wound Description Full Thickness Without Exposed Classification: Support Structures Wound Margin: Distinct, outline attached Exudate Large Amount: Exudate Type: Serosanguineous Exudate Color: red, brown Foul Odor After  Cleansing: No Slough/Fibrino Yes Wound Bed Granulation Amount: Large (67-100%) Exposed Structure Granulation Quality: Red, Hyper-granulation Fascia Exposed: No Porto, Shereda K. ([id number]) Necrotic Amount: Small (1-33%) Fat Layer (Subcutaneous Tissue) Exposed: Yes Necrotic Quality: Adherent Slough Tendon Exposed: No Muscle Exposed: No Joint Exposed: No Bone Exposed: No Periwound Skin Texture Texture Color No Abnormalities Noted: No No Abnormalities Noted: No Excoriation: Yes Ecchymosis: Yes Scarring: Yes Erythema: Yes Erythema Location: Circumferential Moisture No Abnormalities Noted: No Temperature / Pain Temperature: No Abnormality Tenderness on Palpation: Yes Wound Preparation Ulcer Cleansing: Rinsed/Irrigated with Saline, Other: soap and water, Topical Anesthetic Applied: None, Other: lidocaine 4%, Assessment Notes this visit is for a skin sub check all measurements same unable to view wound d/t dressing Treatment Notes Wound #1 (Right, Anterior Lower Leg) 1. Cleansed with: Clean wound with Normal Saline Cleanse wound with antibacterial soap and water 5. Secondary Dressing Applied ABD Pad Dry Gauze 7. Secured with 4-Layer Compression System - Right Lower Extremity Notes unna to anchor, charcoal, DRAWTEX Electronic Signature(s) Signed: 03/27/2017 4:33:22 PM By: Alric Quan Entered By: Alric Quan on 03/27/2017 10:07:03 Hogrefe, [name] ([id number]) -------------------------------------------------------------------------------- Wound Assessment Details Patient Name: [name]. Date of Service: 03/27/2017 9:45 AM Medical Record Number: [id number] Patient Account Number: [id number] Date of Birth/Sex: 12/22/1955 (61 y.o. Female) Treating RN: Carolyne Fiscal, Debi Primary Care Crystin Lechtenberg: Einar Gip Other Clinician: Referring Keola Heninger: Einar Gip Treating Agata Lucente/Extender: Frann Rider in Treatment: 18 Wound Status Wound Number: 2  Primary Skin Tear Etiology: Wound Location: Right Knee - Posterior Wound Open Wounding Event: Shear/Friction Status: Date Acquired: 03/06/2017 Comorbid Chronic Obstructive Pulmonary Weeks Of Treatment: 3 History: Disease (COPD), Hypertension, Clustered Wound: No Rheumatoid Arthritis Photos Photo Uploaded By: Alric Quan on 03/27/2017 16:04:44 Wound Measurements Length: (cm) 0.4 Width: (cm) 1 Depth: (cm) 0.1 Area: (cm) 0.314 Volume: (cm) 0.031 % Reduction in Area: 42.9% % Reduction in Volume: 43.6% Epithelialization: None Tunneling: No Undermining: No Wound Description Classification: Partial Thickness Foul Odor Af Wound Margin: Distinct, outline attached Slough/Fibri Exudate Amount: Medium Exudate Type: Serosanguineous Exudate Color: red, brown ter Cleansing: No no No Wound Bed Granulation Amount: Large (67-100%) Exposed Structure Granulation Quality: Red Fascia Exposed: No Necrotic Amount: None Present (0%) Fat Layer (Subcutaneous Tissue) Exposed: No Tendon Exposed: No Calvert, Caryn K. ([id number]) Muscle Exposed: No Joint Exposed: No Bone Exposed: No Limited to Skin Breakdown Periwound Skin Texture Texture Color No Abnormalities Noted: No No Abnormalities Noted: No Moisture Temperature / Pain No Abnormalities Noted: No Temperature: No Abnormality Tenderness on Palpation: Yes Wound Preparation Ulcer Cleansing: Rinsed/Irrigated with Saline Topical Anesthetic Applied: Other: lidocaine 4%, Treatment Notes Wound #2 (  Right, Posterior Knee) 1. Cleansed with: Clean wound with Normal Saline Cleanse wound with antibacterial soap and water 2. Anesthetic Topical Lidocaine 4% cream to wound bed prior to debridement 4. Dressing Applied: Prisma Ag Notes coverlet Electronic Signature(s) Signed: 03/27/2017 4:33:22 PM By: Alric Quan Entered By: Alric Quan on 03/27/2017 10:05:46 Edge, [name]  ([id number]) -------------------------------------------------------------------------------- [place] Details Patient Name: [name] Date of Service: 03/27/2017 9:45 AM Medical Record Number: [id number] Patient Account Number: [id number] Date of Birth/Sex: 1955/12/27 (61 y.o. Female) Treating RN: Carolyne Fiscal, Debi Primary Care Icarus Partch: Einar Gip Other Clinician: Referring Calvyn Kurtzman: Einar Gip Treating Latif Nazareno/Extender: Frann Rider in Treatment: 18 Vital Signs Time Taken: 10:00 Pulse (bpm): 87 Height (in): 63 Respiratory Rate (breaths/min): 18 Weight (lbs): 214.7 Blood Pressure (mmHg): 163/75 Body Mass Index (BMI): 38 Reference Range: 80 - 120 mg / dl Electronic Signature(s) Signed: 03/27/2017 4:33:22 PM By: Alric Quan Entered By: Alric Quan on 03/27/2017 10:00:57

## 2017-04-02 ENCOUNTER — Other Ambulatory Visit: Payer: Self-pay | Admitting: Internal Medicine

## 2017-04-03 ENCOUNTER — Encounter: Payer: BLUE CROSS/BLUE SHIELD | Admitting: Surgery

## 2017-04-03 ENCOUNTER — Other Ambulatory Visit: Payer: Self-pay | Admitting: Internal Medicine

## 2017-04-03 DIAGNOSIS — I83012 Varicose veins of right lower extremity with ulcer of calf: Secondary | ICD-10-CM | POA: Diagnosis not present

## 2017-04-03 DIAGNOSIS — M069 Rheumatoid arthritis, unspecified: Secondary | ICD-10-CM

## 2017-04-03 MED ORDER — METHOTREXATE (ANTI-RHEUMATIC) 2.5 MG PO TABS: 7.5000 mg | ORAL_TABLET | ORAL | 1 refills | Status: DC

## 2017-04-04 NOTE — Progress Notes (Signed)
ESMAY, AMSPACHER ([id number]) Visit Report for 04/03/2017 Arrival Information Details Patient Name: [name], [name]. Date of Service: 04/03/2017 1:30 PM Medical Record Number: [id number] Patient Account Number: [id number] Date of Birth/Sex: [birth year] (60 y.o. Female) Treating RN: Carolyne Fiscal, Debi Primary Care Ancel Easler: Einar Gip Other Clinician: Referring Onita Pfluger: Einar Gip Treating Reyansh Kushnir/Extender: Frann Rider in Treatment: 35 Visit Information History Since Last Visit All ordered tests and consults were completed: No Patient Arrived: Ambulatory Added or deleted any medications: No Arrival Time: 13:34 Any new allergies or adverse reactions: No Accompanied By: self Had a fall or experienced change in No Transfer Assistance: None activities of daily living that may affect Patient Identification Verified: Yes risk of falls: Secondary Verification Process Yes Signs or symptoms of abuse/neglect since last No Completed: visito Patient Requires Transmission- No Hospitalized since last visit: No Based Precautions: Has Dressing in Place as Prescribed: Yes Patient Has Alerts: Yes Has Compression in Place as Prescribed: Yes Patient Alerts: L Leg ABI 1.2 Pain Present Now: No R Leg ABI 0.88 ABIs done in [place] Signature(s) Signed: 04/03/2017 4:17:25 PM By: Alric Quan Entered By: Alric Quan on 04/03/2017 13:34:26 Marilyn, Pearletha [name] ([id number]) -------------------------------------------------------------------------------- Encounter Discharge Information Details Patient Name: [name]. Date of Service: 04/03/2017 1:30 PM Medical Record Number: [id number] Patient Account Number: [id number] Date of Birth/Sex: Jul 03, 1956 (60 y.o. Female) Treating RN: Carolyne Fiscal, Debi Primary Care Ammiel Guiney: Einar Gip Other Clinician: Referring Alton Bouknight: Einar Gip Treating Trajon Rosete/Extender: Frann Rider in Treatment: 10 Encounter  Discharge Information Items Discharge Pain Level: 0 Discharge Condition: Stable Ambulatory Status: Ambulatory Discharge Destination: Home Transportation: Private Auto Accompanied By: self Schedule Follow-up Appointment: Yes Medication Reconciliation completed and provided to Patient/Care No Jackelin Correia: Provided on Clinical Summary of Care: 04/03/2017 Form Type Recipient Paper Patient JP Electronic Signature(s) Signed: 04/03/2017 2:11:36 PM By: Ruthine Dose Entered By: Ruthine Dose on 04/03/2017 14:11:36 Kau, Pearletha [name] ([id number]) -------------------------------------------------------------------------------- Lower Extremity Assessment Details Patient Name: [name]. Date of Service: 04/03/2017 1:30 PM Medical Record Number: [id number] Patient Account Number: [id number] Date of Birth/Sex: [birth year] (60 y.o. Female) Treating RN: Carolyne Fiscal, Debi Primary Care Syaire Saber: Einar Gip Other Clinician: Referring Mesa Janus: Einar Gip Treating Trisha Morandi/Extender: Frann Rider in Treatment: 19 Edema Assessment Assessed: [Left: No] [Right: No] E[Left: dema] [Right: :] Calf Left: Right: Point of Measurement: 32 cm From Medial Instep cm 44.1 cm Ankle Left: Right: Point of Measurement: 9 cm From Medial Instep cm 25.2 cm Vascular Assessment Pulses: Dorsalis Pedis Palpable: [Right:Yes] Posterior Tibial Extremity colors, hair growth, and conditions: Extremity Color: [Right:Hyperpigmented] Temperature of Extremity: [Right:Warm] Capillary Refill: [Right:< 3 seconds] Toe Nail Assessment Left: Right: Thick: Yes Discolored: Yes Deformed: Yes Improper Length and Hygiene: Yes Electronic Signature(s) Signed: 04/03/2017 4:17:25 PM By: Alric Quan Entered By: Alric Quan on 04/03/2017 13:48:33 Marilyn, Pearletha [name] ([id number]) -------------------------------------------------------------------------------- Multi Wound Chart Details Patient Name: [name]. Date of Service: 04/03/2017 1:30 PM Medical Record Number: [id number] Patient Account Number: [id number] Date of Birth/Sex: 1956/04/27 (60 y.o. Female) Treating RN: Carolyne Fiscal, Debi Primary Care [name]: Einar Gip Other Clinician: Referring Destinie Thornsberry: Einar Gip Treating Zackerie Sara/Extender: Frann Rider in Treatment: 19 Vital Signs Height(in): 63 Pulse(bpm): 83 Weight(lbs): 214.7 Blood Pressure 165/81 (mmHg): Body Mass Index(BMI): 38 Temperature(F): 98.2 Respiratory Rate 18 (breaths/min): Photos: [1:No Photos] [2:No Photos] [N/A:N/A] Wound Location: [1:Right Lower Leg - Anterior Right Knee - Posterior] [N/A:N/A] Wounding Event: [1:Gradually Appeared] [2:Shear/Friction] [N/A:N/A] Primary Etiology: [1:Venous Leg Ulcer] [2:Skin Tear] [N/A:N/A] Comorbid History: [1:Chronic Obstructive Pulmonary Disease (COPD), Hypertension, Rheumatoid  Arthritis] [2:Chronic Obstructive Pulmonary Disease (COPD), Hypertension, Rheumatoid Arthritis] [N/A:N/A] Date Acquired: [1:05/19/2016] [2:03/06/2017] [N/A:N/A] Weeks of Treatment: [1:19] [2:4] [N/A:N/A] Wound Status: [1:Open] [2:Open] [N/A:N/A] Measurements L x W x D 1.5x2x0.1 [2:0x0x0] [N/A:N/A] (cm) Area (cm) : [1:2.356] [2:0] [N/A:N/A] Volume (cm) : [1:0.236] [2:0] [N/A:N/A] % Reduction in Area: [1:86.80%] [2:100.00%] [N/A:N/A] % Reduction in Volume: 86.80% [2:100.00%] [N/A:N/A] Classification: [1:Full Thickness Without Exposed Support Structures] [2:Partial Thickness] [N/A:N/A] Exudate Amount: [1:Large] [2:None Present] [N/A:N/A] Exudate Type: [1:Serosanguineous] [2:N/A] [N/A:N/A] Exudate Color: [1:red, brown] [2:N/A] [N/A:N/A] Wound Margin: [1:Distinct, outline attached Distinct, outline attached] [N/A:N/A] Granulation Amount: [1:Large (67-100%)] [2:None Present (0%)] [N/A:N/A] Granulation Quality: [1:Red, Hyper-granulation] [2:N/A] [N/A:N/A] Necrotic Amount: [1:Small (1-33%)] [2:None Present (0%)] [N/A:N/A] Exposed  Structures: [1:Fat Layer (Subcutaneous Fascia: No Tissue) Exposed: Yes Fascia: No] [2:Fat Layer (Subcutaneous Tissue) Exposed: No] [N/A:N/A] Tendon: No Tendon: No Muscle: No Muscle: No Joint: No Joint: No Bone: No Bone: No Limited to Skin Breakdown Epithelialization: Medium (34-66%) Large (67-100%) N/A Periwound Skin Texture: Excoriation: Yes No Abnormalities Noted N/A Scarring: Yes Periwound Skin No Abnormalities Noted No Abnormalities Noted N/A Moisture: Periwound Skin Color: Ecchymosis: Yes No Abnormalities Noted N/A Erythema: Yes Erythema Location: Circumferential N/A N/A Temperature: No Abnormality No Abnormality N/A Tenderness on Yes Yes N/A Palpation: Wound Preparation: Ulcer Cleansing: Ulcer Cleansing: N/A Rinsed/Irrigated with Rinsed/Irrigated with Saline, Other: soap and Saline water Topical Anesthetic Topical Anesthetic Applied: None Applied: None, Other: lidocaine 4% Treatment Notes Electronic Signature(s) Signed: 04/03/2017 2:03:40 PM By: Christin Fudge MD, FACS Entered By: Christin Fudge on 04/03/2017 14:03:40 Noteboom, Pearletha [name] ([id number]) -------------------------------------------------------------------------------- [place] Details Patient Name: [name]. Date of Service: 04/03/2017 1:30 PM Medical Record Number: [id number] Patient Account Number: [id number] Date of Birth/Sex: Jun 01, 1956 (60 y.o. Female) Treating RN: Carolyne Fiscal, Debi Primary Care Tanis Hensarling: Einar Gip Other Clinician: Referring Kataleena Holsapple: Einar Gip Treating Sydny Schnitzler/Extender: Frann Rider in Treatment: 19 Active Inactive ` Abuse / Safety / Falls / Self Care Management Nursing Diagnoses: Potential for falls Goals: Patient will remain injury free Date Initiated: 11/18/2016 Target Resolution Date: 01/20/2017 Goal Status: Active Interventions: Assess fall risk on admission and as needed Assess self care needs on admission and as  needed Notes: ` Nutrition Nursing Diagnoses: Imbalanced nutrition Goals: Patient/caregiver agrees to and verbalizes understanding of need to use nutritional supplements and/or vitamins as prescribed Date Initiated: 11/18/2016 Target Resolution Date: 01/20/2017 Goal Status: Active Interventions: Assess patient nutrition upon admission and as needed per policy Notes: ` Orientation to the Wound Care Program Nursing Diagnoses: CHERRIL, HETT ([id number]) Knowledge deficit related to the wound healing center program Goals: Patient/caregiver will verbalize understanding of the [place] Date Initiated: 11/18/2016 Target Resolution Date: 01/20/2017 Goal Status: Active Interventions: Provide education on orientation to the wound center Notes: ` Pain, Acute or Chronic Nursing Diagnoses: Pain, acute or chronic: actual or potential Potential alteration in comfort, pain Goals: Patient will verbalize adequate pain control and receive pain control interventions during procedures as needed Date Initiated: 11/18/2016 Target Resolution Date: 01/20/2017 Goal Status: Active Patient/caregiver will verbalize adequate pain control between visits Date Initiated: 11/18/2016 Target Resolution Date: 01/20/2017 Goal Status: Active Patient/caregiver will verbalize comfort level met Date Initiated: 11/18/2016 Target Resolution Date: 01/20/2017 Goal Status: Active Interventions: Assess comfort goal upon admission Complete pain assessment as per visit requirements Notes: ` Soft Tissue Infection Nursing Diagnoses: Impaired tissue integrity Knowledge deficit related to disease process and management Knowledge deficit related to home infection control: handwashing, handling of soiled dressings, supply storage Goals: Kemnitz, Merrin K. (  [id number]) Patient/caregiver will verbalize understanding of or measures to prevent infection and contamination in the home setting Date Initiated:  11/18/2016 Target Resolution Date: 01/20/2017 Goal Status: Active Patient's soft tissue infection will resolve Date Initiated: 11/18/2016 Target Resolution Date: 01/20/2017 Goal Status: Active Interventions: Assess signs and symptoms of infection every visit Provide education on infection Treatment Activities: Education provided on Infection : 11/18/2016 Notes: ` Wound/Skin Impairment Nursing Diagnoses: Impaired tissue integrity Goals: Ulcer/skin breakdown will have a volume reduction of 30% by week 4 Date Initiated: 11/18/2016 Target Resolution Date: 01/20/2017 Goal Status: Active Ulcer/skin breakdown will have a volume reduction of 50% by week 8 Date Initiated: 11/18/2016 Target Resolution Date: 01/20/2017 Goal Status: Active Ulcer/skin breakdown will have a volume reduction of 80% by week 12 Date Initiated: 11/18/2016 Target Resolution Date: 01/20/2017 Goal Status: Active Interventions: Assess patient/caregiver ability to perform ulcer/skin care regimen upon admission and as needed Assess ulceration(s) every visit Provide education on smoking Notes: Electronic Signature(s) Signed: 04/03/2017 4:17:25 PM By: Alric Quan Entered By: Alric Quan on 04/03/2017 [place], Pearletha [name] ([id number]) -------------------------------------------------------------------------------- Pain Assessment Details Patient Name: [name]. Date of Service: 04/03/2017 1:30 PM Medical Record Number: [id number] Patient Account Number: [id number] Date of Birth/Sex: [birth year] (60 y.o. Female) Treating RN: Carolyne Fiscal, Debi Primary Care Aziza Stuckert: Einar Gip Other Clinician: Referring Peja Allender: Einar Gip Treating Eliga Arvie/Extender: Frann Rider in Treatment: 19 Active Problems Location of Pain Severity and Description of Pain Patient Has Paino No Site Locations With Dressing Change: No Pain Management and Medication Current Pain Management: Electronic  Signature(s) Signed: 04/03/2017 4:17:25 PM By: Alric Quan Entered By: Alric Quan on 04/03/2017 13:34:32 Maul[place], Pearletha [name] ([id number]) -------------------------------------------------------------------------------- Patient/Caregiver Education Details Patient Name: [name]. Date of Service: 04/03/2017 1:30 PM Medical Record Number: [id number] Patient Account Number: [id number] Date of Birth/Gender: Feb 12, 1956 (60 y.o. Female) Treating RN: Carolyne Fiscal, Debi Primary Care Physician: Einar Gip Other Clinician: Referring Physician: Einar Gip Treating Physician/Extender: Frann Rider in Treatment: 30 Education Assessment Education Provided To: Patient Education Topics Provided Wound/Skin Impairment: Handouts: Other: do not get wrap wet Methods: Demonstration, Explain/Verbal Responses: State content correctly Electronic Signature(s) Signed: 04/03/2017 4:17:25 PM By: Alric Quan Entered By: Alric Quan on 04/03/2017 13:51:57 [place]lman, Pearletha [name] ([id number]) -------------------------------------------------------------------------------- Wound Assessment Details Patient Name: [name]. Date of Service: 04/03/2017 1:30 PM Medical Record Number: [id number] Patient Account Number: [id number] Date of Birth/Sex: [birth year] (60 y.o. Female) Treating RN: Carolyne Fiscal, Debi Primary Care Elloise Roark: Einar Gip Other Clinician: Referring Ayesha Markwell: Einar Gip Treating Arianah Torgeson/Extender: Frann Rider in Treatment: 19 Wound Status Wound Number: 1 Primary Venous Leg Ulcer Etiology: Wound Location: Right Lower Leg - Anterior Wound Open Wounding Event: Gradually Appeared Status: Date Acquired: 05/19/2016 Comorbid Chronic Obstructive Pulmonary Weeks Of Treatment: 19 History: Disease (COPD), Hypertension, Clustered Wound: No Rheumatoid Arthritis Photos Photo Uploaded By: Alric Quan on 04/03/2017 16:12:34 Wound Measurements Length:  (cm) 1.5 Width: (cm) 2 Depth: (cm) 0.1 Area: (cm) 2.356 Volume: (cm) 0.236 % Reduction in Area: 86.8% % Reduction in Volume: 86.8% Epithelialization: Medium (34-66%) Tunneling: No Wound Description Full Thickness Without Exposed Classification: Support Structures Wound Margin: Distinct, outline attached Exudate Large Amount: Exudate Type: Serosanguineous Exudate Color: red, brown Foul Odor After Cleansing: No Slough/Fibrino No Wound Bed Granulation Amount: Large (67-100%) Exposed Structure Granulation Quality: Red, Hyper-granulation Fascia Exposed: No Samara, Imogean K. ([id number]) Necrotic Amount: Small (1-33%) Fat Layer (Subcutaneous Tissue) Exposed: Yes Necrotic Quality: Adherent Slough Tendon Exposed: No Muscle Exposed: No Joint Exposed: No Bone Exposed: No Periwound Skin  Texture Texture Color No Abnormalities Noted: No No Abnormalities Noted: No Excoriation: Yes Ecchymosis: Yes Scarring: Yes Erythema: Yes Erythema Location: Circumferential Moisture No Abnormalities Noted: No Temperature / Pain Temperature: No Abnormality Tenderness on Palpation: Yes Wound Preparation Ulcer Cleansing: Rinsed/Irrigated with Saline, Other: soap and water, Topical Anesthetic Applied: None, Other: lidocaine 4%, Treatment Notes Wound #1 (Right, Anterior Lower Leg) 1. Cleansed with: Clean wound with Normal Saline Cleanse wound with antibacterial soap and water 2. Anesthetic Topical Lidocaine 4% cream to wound bed prior to debridement 4. Dressing Applied: Hydrafera Blue 5. Secondary Dressing Applied ABD Pad 7. Secured with Tape 4-Layer Compression System - Right Lower Extremity Notes unna to anchor Electronic Signature(s) Signed: 04/03/2017 4:17:25 PM By: Alric Quan Entered By: Alric Quan on 04/03/2017 13:47:40 [place], Pearletha [name] ([id number]) -------------------------------------------------------------------------------- Wound Assessment  Details Patient Name: [name]. Date of Service: 04/03/2017 1:30 PM Medical Record Number: [id number] Patient Account Number: [id number] Date of Birth/Sex: 08-23-1956 (60 y.o. Female) Treating RN: Carolyne Fiscal, Debi Primary Care Rivaldo Hineman: Einar Gip Other Clinician: Referring Keyaan Lederman: Einar Gip Treating Zaydan Papesh/Extender: Frann Rider in Treatment: 19 Wound Status Wound Number: 2 Primary Skin Tear Etiology: Wound Location: Right Knee - Posterior Wound Open Wounding Event: Shear/Friction Status: Date Acquired: 03/06/2017 Comorbid Chronic Obstructive Pulmonary Weeks Of Treatment: 4 History: Disease (COPD), Hypertension, Clustered Wound: No Rheumatoid Arthritis Photos Photo Uploaded By: Alric Quan on 04/03/2017 16:12:52 Wound Measurements Length: (cm) Width: (cm) Depth: (cm) Area: (cm) Volume: (cm) 0 % Reduction in Area: 100% 0 % Reduction in Volume: 100% 0 Epithelialization: Large (67-100%) 0 Tunneling: No 0 Undermining: No Wound Description Classification: Partial Thickness Wound Margin: Distinct, outline attached Exudate Amount: None Present Foul Odor After Cleansing: No Slough/Fibrino No Wound Bed Granulation Amount: None Present (0%) Exposed Structure Necrotic Amount: None Present (0%) Fascia Exposed: No Fat Layer (Subcutaneous Tissue) Exposed: No Tendon Exposed: No Muscle Exposed: No Joint Exposed: No Ellerman, Khady K. ([id number]) Bone Exposed: No Limited to Skin Breakdown Periwound Skin Texture Texture Color No Abnormalities Noted: No No Abnormalities Noted: No Moisture Temperature / Pain No Abnormalities Noted: No Temperature: No Abnormality Tenderness on Palpation: Yes Wound Preparation Ulcer Cleansing: Rinsed/Irrigated with Saline Topical Anesthetic Applied: None Electronic Signature(s) Signed: 04/03/2017 4:17:25 PM By: Alric Quan Entered By: Alric Quan on 04/03/2017 13:48:07 [place], Pearletha [name]  ([id number]) -------------------------------------------------------------------------------- Vitals Details Patient Name: [name]. Date of Service: 04/03/2017 1:30 PM Medical Record Number: [id number] Patient Account Number: [id number] Date of Birth/Sex: 04/27/1956 (60 y.o. Female) Treating RN: Carolyne Fiscal, Debi Primary Care Breyden Jeudy: Einar Gip Other Clinician: Referring Desteny Freeman: Einar Gip Treating Shivali Quackenbush/Extender: Frann Rider in Treatment: 19 Vital Signs Time Taken: 13:34 Temperature (F): 98.2 Height (in): 63 Pulse (bpm): 83 Weight (lbs): 214.7 Respiratory Rate (breaths/min): 18 Body Mass Index (BMI): 38 Blood Pressure (mmHg): 165/81 Reference Range: 80 - 120 mg / dl Electronic Signature(s) Signed: 04/03/2017 4:17:25 PM By: Alric Quan Entered By: Alric Quan on 04/03/2017 13:36:28

## 2017-04-04 NOTE — Progress Notes (Signed)
[name], [name] ([id number]) Visit Report for 04/03/2017 Chief Complaint Document Details Patient Name: [name], [name]. Date of Service: 04/03/2017 1:30 PM Medical Record Number: [id number] Patient Account Number: [id number] Date of Birth/Sex: 27-Jan-1956 (61 y.o. Female) Treating RN: Ashok Cordia, Debi Primary Care Provider: Noemi Chapel Other Clinician: Referring Provider: Noemi Chapel Treating Provider/Extender: Rudene Re in Treatment: 19 Information Obtained from: Patient Chief Complaint Patient presents for treatment of an open ulcer due to venous insufficiency to her right lower extremity which she's had for over 6 months Electronic Signature(s) Signed: 04/03/2017 2:03:48 PM By: Evlyn Kanner MD, FACS Entered By: Evlyn Kanner on 04/03/2017 14:03:48 [name], [name] ([id number]) -------------------------------------------------------------------------------- HPI Details Patient Name: [name]. Date of Service: 04/03/2017 1:30 PM Medical Record Number: [id number] Patient Account Number: [id number] Date of Birth/Sex: [birth year] (61 y.o. Female) Treating RN: Ashok Cordia, Debi Primary Care Provider: Noemi Chapel Other Clinician: Referring Provider: Noemi Chapel Treating Provider/Extender: Rudene Re in Treatment: 19 History of Present Illness Location: ulcerated area on the right lower extremity Quality: Patient reports experiencing a sharp pain to affected area(s). Severity: Patient states wound are getting worse. Duration: Patient has had the wound for > 6 months prior to seeking treatment at the wound center Timing: Pain in wound is constant (hurts all the time) Context: The wound appeared gradually over time Modifying Factors: Other treatment(s) tried include:recent admission to hospital for a cellulitis and was treated with antibiotics and an Unna boot Associated Signs and Symptoms: Patient reports having increase swelling. HPI Description:  61 year old patient who was recently admitted to the hospital between 11/14/2016 and 11/15/2016 for right lower extremity ulcer with cellulitis. she was asked to stop clindamycin and doxycycline and her prednisone and take Levaquin 500 mg daily by mouth. she was treated for a right lower extremity possibly due to venous stasis versus vascular insufficiency versus infection. She received IV vancomycin during her hospital stay. He is known to have rheumatoid arthritis and stopped taking her treatment because of lack of insurance. during her hospital stay vascular studies were done -- ABIs were 0.88 on the right and 1.1 on the left, she had triphasic flow on both lower extremities and a plain film of the right tibia and fibula showed no signs of osteomyelitis. She is not a diabetic. She is a smoker however and continued to smoke. lab work noted that her sedimentation rate was 29, CRP was less than 0.8, hemoglobin A1c was 5.2 12/02/2016 -- due to a family emergency and social economic reasons she has been unable to keep her appointment with the vascular department for her workup and is expecting insurance coverage after January 1 12/19/2016 -- she continues to smoke. As have new insurance and hopefully will get her vascular test done as soon as possible. 12/26/2016 -- she has quit smoking for the last 3 days and I have commended her. She did receive her appointment from vein and vascular [place] and it is later this month 01/13/2017 -- she has not been back to see [place] since January 11 and had her compression wrap on all these days almost 3 weeks. I have sternly cautioned her regarding this. Her appointment for a venous duplex study is next Thursday. 01/23/2017 ---- lower extremity venous duplex reflux evaluation shows small saphenous vein is competent but there is evidence of great saphenous vein reflux in the right lower extremity with no evidence of DVT or SVT. There is also deep vein reflux in  the right lower extremity. A vascular consult was  recommended. Note the left side was not studied. Lower extremity arterial evaluation was done -- unable to obtain right dorsalis pedis or posterior tibial pressures due to an open wound. Posterior tibial and dorsalis pedis waveform appears strong and triphasic. Right TBI was 0.88. Left ABI was 1.21. [name], [name] ([id number]) 01/30/2017 -- he has an appointment to see the vascular surgeons at [facility] this coming Thursday. her Apligraf has been authorized by her [employer] and we will get this for her for next week. 2/22 2018 -- she was seen by Dr. Tawanna Cooler Early this morning -- she was found to have severe CEAP class VI venous stasis disease and had recommended laser ablation of her right great saphenous vein and stab phlebectomy's of multiple tributary varicosities to recent venous hypertension and improve her wound healing and reduce the risk of recurrent ulceration. She has had her first application of Apligraf today. 02/13/2017 -- her surgery has been scheduled for next Thursday and hence we will not see her to the Monday after. He will be prepared to apply an Apligraf on that day 02/24/2017 -- her surgery last Thursday was canceled and it has been scheduled for next Thursday. 03/06/2017 -- was seen today by Dr. Tawanna Cooler Early who reviewed a duplex which shows thrombosis of the great saphenous vein with no evidence of DVT -- He recommended she should continue with the wound clinic and would see her back on an as-needed basis. For various reasons the patient has not been here regularly, as been a month sincee her last application of Apligraf. Today she is had a second application of the Apligraf. 03/20/2017 -- she is here for her third application of Apligraf 04/02/2017 -- she was here for her fourth application of Apligraf but the wound has gotten much smaller and almost completely healed and hence we will not use any more  Apligraf Electronic Signature(s) Signed: 04/03/2017 2:04:33 PM By: Evlyn Kanner MD, FACS Entered By: Evlyn Kanner on 04/03/2017 14:04:31 Haegele, [name] ([id number]) -------------------------------------------------------------------------------- Physical Exam Details Patient Name: [name]. Date of Service: 04/03/2017 1:30 PM Medical Record Number: [id number] Patient Account Number: [id number] Date of Birth/Sex: 04-May-1956 (61 y.o. Female) Treating RN: Ashok Cordia, Debi Primary Care Provider: Noemi Chapel Other Clinician: Referring Provider: Noemi Chapel Treating Provider/Extender: Rudene Re in Treatment: 19 Constitutional . Pulse regular. Respirations normal and unlabored. Afebrile. . Eyes Nonicteric. Reactive to light. Ears, Nose, Mouth, and Throat Lips, teeth, and gums WNL.Marland Kitchen Moist mucosa without lesions. Neck supple and nontender. No palpable supraclavicular or cervical adenopathy. Normal sized without goiter. Respiratory WNL. No retractions.. Breath sounds WNL, No rubs, rales, rhonchi, or wheeze.. Cardiovascular Heart rhythm and rate regular, no murmur or gallop.. Pedal Pulses WNL. No clubbing, cyanosis or edema. Chest Breasts symmetical and no nipple discharge.. Breast tissue WNL, no masses, lumps, or tenderness.. Lymphatic No adneopathy. No adenopathy. No adenopathy. Musculoskeletal Adexa without tenderness or enlargement.. Digits and nails w/o clubbing, cyanosis, infection, petechiae, ischemia, or inflammatory conditions.. Integumentary (Hair, Skin) No suspicious lesions. No crepitus or fluctuance. No peri-wound warmth or erythema. No masses.Marland Kitchen Psychiatric Judgement and insight Intact.. No evidence of depression, anxiety, or agitation.. Notes the wound is looking excellent and much tinier with healthy epithelialization and hence we will not use any more Apligraf. We will continue with local care and a 4-layer Profore compression Electronic  Signature(s) Signed: 04/03/2017 2:05:13 PM By: Evlyn Kanner MD, FACS Entered By: Evlyn Kanner on 04/03/2017 14:05:11 [name], [name] ([id number]) -------------------------------------------------------------------------------- Physician Orders Details Patient Name:  Haltiwanger, Marquasia K. Date of Service: 04/03/2017 1:30 PM Medical Record Number: [id number] Patient Account Number: [id number] Date of Birth/Sex: 02/14/1956 (61 y.o. Female) Treating RN: Ashok Cordia, Debi Primary Care Provider: Noemi Chapel Other Clinician: Referring Provider: Noemi Chapel Treating Provider/Extender: Rudene Re in Treatment: 74 Verbal / Phone Orders: Yes Clinician: Ashok Cordia, Debi Read Back and Verified: Yes Diagnosis Coding Wound Cleansing Wound #1 Right,Anterior Lower Leg o Clean wound with Normal Saline. o Cleanse wound with mild soap and water Anesthetic Wound #1 Right,Anterior Lower Leg o Topical Lidocaine 4% cream applied to wound bed prior to debridement Primary Wound Dressing Wound #1 Right,Anterior Lower Leg o Hydrafera Blue Secondary Dressing Wound #1 Right,Anterior Lower Leg o ABD pad Dressing Change Frequency Wound #1 Right,Anterior Lower Leg o Change dressing every week Follow-up Appointments Wound #1 Right,Anterior Lower Leg o Return Appointment in 1 week. o Nurse Visit as needed Edema Control Wound #1 Right,Anterior Lower Leg o 4-Layer Compression System - Right Lower Extremity - unna to anchor Additional Orders / Instructions Wound #1 Right,Anterior Lower Leg o Stop Smoking o Increase protein intake. JAZZMA, NEIDHARDT ([id number]) Medications-please add to medication list. Wound #1 Right,Anterior Lower Leg o Other: - Vitamin C, Zinc, Vitamin A, MVI Electronic Signature(s) Signed: 04/03/2017 4:16:12 PM By: Evlyn Kanner MD, FACS Signed: 04/03/2017 4:17:25 PM By: Alejandro Mulling Entered By: Alejandro Mulling on 04/03/2017 13:57:33 [name], [name] ([id number]) -------------------------------------------------------------------------------- Problem List Details Patient Name: [name], [name]. Date of Service: 04/03/2017 1:30 PM Medical Record Number: [id number] Patient Account Number: [id number] Date of Birth/Sex: 02/26/1956 (61 y.o. 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 11/18/2016 Yes exposed I83.012 Varicose veins of right lower extremity with ulcer of calf 11/18/2016 Yes F17.218 Nicotine dependence, cigarettes, with other nicotine- 11/18/2016 Yes induced disorders M05.60 Rheumatoid arthritis of unspecified site with involvement 11/18/2016 Yes of other organs and systems Inactive Problems Resolved Problems Electronic Signature(s) Signed: 04/03/2017 2:03:34 PM By: Evlyn Kanner MD, FACS Entered By: Evlyn Kanner on 04/03/2017 14:03:34 [name], [name] ([id number]) -------------------------------------------------------------------------------- Progress Note Details Patient Name: [name]. Date of Service: 04/03/2017 1:30 PM Medical Record Number: [id number] Patient Account Number: [id number] Date of Birth/Sex: [birth year] (61 y.o. Female) Treating RN: Ashok Cordia, Debi Primary Care Provider: Noemi Chapel Other Clinician: Referring Provider: Noemi Chapel Treating Provider/Extender: Rudene Re in Treatment: 19 Subjective Chief Complaint Information obtained from Patient Patient presents for treatment of an open ulcer due to venous insufficiency to her right lower extremity which she's had for over 6 months History of Present Illness (HPI) The following HPI elements were documented for the patient's wound: Location:  ulcerated area on the right lower extremity Quality: Patient reports experiencing a sharp pain to affected area(s). Severity: Patient states wound are getting worse. Duration: Patient has had the wound for > 6 months prior to seeking treatment at the wound center Timing: Pain in wound is constant (hurts all the time) Context: The wound appeared gradually over time Modifying Factors: Other treatment(s) tried include:recent admission to hospital for a cellulitis and was treated with antibiotics and an Unna boot Associated Signs and Symptoms: Patient reports having increase swelling. 61 year old patient who was recently admitted to the hospital between 11/14/2016 and 11/15/2016 for right lower extremity ulcer with cellulitis. she was asked to stop clindamycin and doxycycline and  her prednisone and take Levaquin 500 mg daily by mouth. she was treated for a right lower extremity possibly due to venous stasis versus vascular insufficiency versus infection. She received IV vancomycin during her hospital stay. He is known to have rheumatoid arthritis and stopped taking her treatment because of lack of insurance. during her hospital stay vascular studies were done -- ABIs were 0.88 on the right and 1.1 on the left, she had triphasic flow on both lower extremities and a plain film of the right tibia and fibula showed no signs of osteomyelitis. She is not a diabetic. She is a smoker however and continued to smoke. lab work noted that her sedimentation rate was 29, CRP was less than 0.8, hemoglobin A1c was 5.2 12/02/2016 -- due to a family emergency and social economic reasons she has been unable to keep her appointment with the vascular department for her workup and is expecting insurance coverage after January 1 12/19/2016 -- she continues to smoke. As have new insurance and hopefully will get her vascular test done as soon as possible. 12/26/2016 -- she has quit smoking for the last 3 days and I have  commended her. She did receive her appointment from vein and vascular [place] and it is later this month 01/13/2017 -- she has not been back to see [place] since January 11 and had her compression wrap on all these days almost 3 weeks. I have sternly cautioned her regarding this. Her appointment for a venous [name], [name]. ([id number]) duplex study is next Thursday. 01/23/2017 ---- lower extremity venous duplex reflux evaluation shows small saphenous vein is competent but there is evidence of great saphenous vein reflux in the right lower extremity with no evidence of DVT or SVT. There is also deep vein reflux in the right lower extremity. A vascular consult was recommended. Note the left side was not studied. Lower extremity arterial evaluation was done -- unable to obtain right dorsalis pedis or posterior tibial pressures due to an open wound. Posterior tibial and dorsalis pedis waveform appears strong and triphasic. Right TBI was 0.88. Left ABI was 1.21. 01/30/2017 -- he has an appointment to see the vascular surgeons at [facility] this coming Thursday. her Apligraf has been authorized by her [employer] and we will get this for her for next week. 2/22 2018 -- she was seen by Dr. Tawanna Cooler Early this morning -- she was found to have severe CEAP class VI venous stasis disease and had recommended laser ablation of her right great saphenous vein and stab phlebectomy's of multiple tributary varicosities to recent venous hypertension and improve her wound healing and reduce the risk of recurrent ulceration. She has had her first application of Apligraf today. 02/13/2017 -- her surgery has been scheduled for next Thursday and hence we will not see her to the Monday after. He will be prepared to apply an Apligraf on that day 02/24/2017 -- her surgery last Thursday was canceled and it has been scheduled for next Thursday. 03/06/2017 -- was seen today by Dr. Tawanna Cooler Early who reviewed a duplex which  shows thrombosis of the great saphenous vein with no evidence of DVT -- He recommended she should continue with the wound clinic and would see her back on an as-needed basis. For various reasons the patient has not been here regularly, as been a month sincee her last application of Apligraf. Today she is had a second application of the Apligraf. 03/20/2017 -- she is here for her third application of Apligraf 04/02/2017 --  she was here for her fourth application of Apligraf but the wound has gotten much smaller and almost completely healed and hence we will not use any more Apligraf Objective Constitutional Pulse regular. Respirations normal and unlabored. Afebrile. Vitals Time Taken: 1:34 PM, Height: 63 in, Weight: 214.7 lbs, BMI: 38, Temperature: 98.2 F, Pulse: 83 bpm, Respiratory Rate: 18 breaths/min, Blood Pressure: 165/81 mmHg. Eyes Nonicteric. Reactive to light. [name], [name] ([id number]) Ears, Nose, Mouth, and Throat Lips, teeth, and gums WNL.Marland Kitchen Moist mucosa without lesions. Neck supple and nontender. No palpable supraclavicular or cervical adenopathy. Normal sized without goiter. Respiratory WNL. No retractions.. Breath sounds WNL, No rubs, rales, rhonchi, or wheeze.. Cardiovascular Heart rhythm and rate regular, no murmur or gallop.. Pedal Pulses WNL. No clubbing, cyanosis or edema. Chest Breasts symmetical and no nipple discharge.. Breast tissue WNL, no masses, lumps, or tenderness.. Lymphatic No adneopathy. No adenopathy. No adenopathy. Musculoskeletal Adexa without tenderness or enlargement.. Digits and nails w/o clubbing, cyanosis, infection, petechiae, ischemia, or inflammatory conditions.Marland Kitchen Psychiatric Judgement and insight Intact.. No evidence of depression, anxiety, or agitation.. General Notes: the wound is looking excellent and much tinier with healthy epithelialization and hence we will not use any more Apligraf. We will continue with local care and a 4-layer  Profore compression Integumentary (Hair, Skin) No suspicious lesions. No crepitus or fluctuance. No peri-wound warmth or erythema. No masses.. Wound #1 status is Open. Original cause of wound was Gradually Appeared. The wound is located on the Right,Anterior Lower Leg. The wound measures 1.5cm length x 2cm width x 0.1cm depth; 2.356cm^2 area and 0.236cm^3 volume. There is Fat Layer (Subcutaneous Tissue) Exposed exposed. There is no tunneling noted. There is a large amount of serosanguineous drainage noted. The wound margin is distinct with the outline attached to the wound base. There is large (67-100%) red granulation within the wound bed. There is a small (1-33%) amount of necrotic tissue within the wound bed including Adherent Slough. The periwound skin appearance exhibited: Excoriation, Scarring, Ecchymosis, Erythema. The surrounding wound skin color is noted with erythema which is circumferential. Periwound temperature was noted as No Abnormality. The periwound has tenderness on palpation. Wound #2 status is Open. Original cause of wound was Shear/Friction. The wound is located on the Right,Posterior Knee. The wound measures 0cm length x 0cm width x 0cm depth; 0cm^2 area and 0cm^3 volume. The wound is limited to skin breakdown. There is no tunneling or undermining noted. There is a none present amount of drainage noted. The wound margin is distinct with the outline attached to the wound base. There is no granulation within the wound bed. There is no necrotic tissue within the wound bed. Periwound temperature was noted as No Abnormality. The periwound has tenderness on palpation. [name], [name] ([id number]) Assessment Active Problems ICD-10 [phone] - Non-pressure chronic ulcer of right calf with fat layer exposed I83.012 - Varicose veins of right lower extremity with ulcer of calf F17.218 - Nicotine dependence, cigarettes, with other nicotine-induced disorders M05.60 - Rheumatoid  arthritis of unspecified site with involvement of other organs and systems Plan Wound Cleansing: Wound #1 Right,Anterior Lower Leg: Clean wound with Normal Saline. Cleanse wound with mild soap and water Anesthetic: Wound #1 Right,Anterior Lower Leg: Topical Lidocaine 4% cream applied to wound bed prior to debridement Primary Wound Dressing: Wound #1 Right,Anterior Lower Leg: Hydrafera Blue Secondary Dressing: Wound #1 Right,Anterior Lower Leg: ABD pad Dressing Change Frequency: Wound #1 Right,Anterior Lower Leg: Change dressing every week Follow-up Appointments: Wound #1 Right,Anterior Lower Leg:  Return Appointment in 1 week. Nurse Visit as needed Edema Control: Wound #1 Right,Anterior Lower Leg: 4-Layer Compression System - Right Lower Extremity - unna to anchor Additional Orders / Instructions: Wound #1 Right,Anterior Lower Leg: Stop Smoking Increase protein intake. Medications-please add to medication list.: Wound #1 Right,Anterior Lower Leg: Other: - Vitamin C, Zinc, Vitamin A, MVI [name], [name] ([id number]) She has made excellent progress this week-- The wound base is clean and she will not require an application of Apligraf today. I have asked for [facility] and this will be covered with a 4-layer compression. elevation and exercise has been encouraged and she continues to stay off cigarettes. We will see her back for review next week Electronic Signature(s) Signed: 04/03/2017 2:07:34 PM By: Evlyn Kanner MD, FACS Entered By: Evlyn Kanner on 04/03/2017 14:07:34 [place], [name] ([id number]) -------------------------------------------------------------------------------- SuperBill Details Patient Name: [name]. Date of Service: 04/03/2017 Medical Record Number: [id number] Patient Account Number: [id number] Date of Birth/Sex: 09-Aug-1956 (61 y.o. Female) Treating RN: Ashok Cordia, Debi Primary Care Provider: Noemi Chapel Other Clinician: Referring  Provider: Noemi Chapel Treating Provider/Extender: Rudene Re in Treatment: 19 Diagnosis Coding ICD-10 Codes Code Description [phone] Non-pressure chronic ulcer of right calf with fat layer exposed I83.012 Varicose veins of right lower extremity with ulcer of calf F17.218 Nicotine dependence, cigarettes, with other nicotine-induced disorders M05.60 Rheumatoid arthritis of unspecified site with involvement of other organs and systems Facility Procedures CPT4: Description Modifier Quantity Code [id number] (Facility Use Only) [phone] - APPLY MULTLAY COMPRS LWR RT 1 LEG Physician Procedures CPT4: Description Modifier Quantity Code [id number] - WC PHYS LEVEL 3 - EST PT 1 ICD-10 Description Diagnosis 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 layer exposed I83.012 Varicose veins of right lower extremity with ulcer of calf  F17.218 Nicotine dependence, cigarettes, with other nicotine-induced disorders M05.60 Rheumatoid arthritis of unspecified site with involvement of other organs and systems Electronic Signature(s) Signed: 04/03/2017 4:16:12 PM By: Evlyn Kanner MD, FACS Signed: 04/03/2017 4:17:25 PM By: Alejandro Mulling Previous Signature: 04/03/2017 2:07:51 PM Version By: Evlyn Kanner MD, FACS Entered By: Alejandro Mulling on 04/03/2017 14:21:16

## 2017-04-07 ENCOUNTER — Other Ambulatory Visit: Payer: Self-pay | Admitting: *Deleted

## 2017-04-10 ENCOUNTER — Encounter: Payer: BLUE CROSS/BLUE SHIELD | Admitting: Surgery

## 2017-04-10 DIAGNOSIS — I83012 Varicose veins of right lower extremity with ulcer of calf: Secondary | ICD-10-CM | POA: Diagnosis not present

## 2017-04-11 NOTE — Progress Notes (Signed)
GERYL, DOHN ([id number]) Visit Report for 04/10/2017 Chief Complaint Document Details Patient Name: [name], [name]. Date of Service: 04/10/2017 1:15 PM Medical Record Number: [id number] Patient Account Number: [id number] Date of Birth/Sex: 11-Apr-1956 (61 y.o. Female) Treating RN: Ashok Cordia, Debi Primary Care Provider: Noemi Chapel Other Clinician: Referring Provider: Noemi Chapel Treating Provider/Extender: Rudene Re in Treatment: 20 Information Obtained from: Patient Chief Complaint Patient presents for treatment of an open ulcer due to venous insufficiency to her right lower extremity which she's had for over 6 months Electronic Signature(s) Signed: 04/10/2017 2:16:10 PM By: Evlyn Kanner MD, FACS Entered By: Evlyn Kanner on 04/10/2017 14:16:09 [name], [name] ([id number]) -------------------------------------------------------------------------------- HPI Details Patient Name: Marilyn Mins. Date of Service: 04/10/2017 1:15 PM Medical Record Number: [id number] Patient Account Number: [id number] Date of Birth/Sex: Apr 01, 1956 (61 y.o. Female) Treating RN: Ashok Cordia, Debi Primary Care Provider: Noemi Chapel Other Clinician: Referring Provider: Noemi Chapel Treating Provider/Extender: Rudene Re in Treatment: 20 History of Present Illness Location: ulcerated area on the right lower extremity Quality: Patient reports experiencing a sharp pain to affected area(s). Severity: Patient states wound are getting worse. Duration: Patient has had the wound for > 6 months prior to seeking treatment at the wound center Timing: Pain in wound is constant (hurts all the time) Context: The wound appeared gradually over time Modifying Factors: Other treatment(s) tried include:recent admission to hospital for a cellulitis and was treated with antibiotics and an Unna boot Associated Signs and Symptoms: Patient reports having increase swelling. HPI Description:  61 year old patient who was recently admitted to the hospital between 11/14/2016 and 11/15/2016 for right lower extremity ulcer with cellulitis. she was asked to stop clindamycin and doxycycline and her prednisone and take Levaquin 500 mg daily by mouth. she was treated for a right lower extremity possibly due to venous stasis versus vascular insufficiency versus infection. She received IV vancomycin during her hospital stay. He is known to have rheumatoid arthritis and stopped taking her treatment because of lack of insurance. during her hospital stay vascular studies were done -- ABIs were 0.88 on the right and 1.1 on the left, she had triphasic flow on both lower extremities and a plain film of the right tibia and fibula showed no signs of osteomyelitis. She is not a diabetic. She is a smoker however and continued to smoke. lab work noted that her sedimentation rate was 29, CRP was less than 0.8, hemoglobin A1c was 5.2 12/02/2016 -- due to a family emergency and social economic reasons she has been unable to keep her appointment with the vascular department for her workup and is expecting insurance coverage after January 1 12/19/2016 -- she continues to smoke. As have new insurance and hopefully will get her vascular test done as soon as possible. 12/26/2016 -- she has quit smoking for the last 3 days and I have commended her. She did receive her appointment from vein and vascular [place] and it is later this month 01/13/2017 -- she has not been back to see [place] since January 11 and had her compression wrap on all these days almost 3 weeks. I have sternly cautioned her regarding this. Her appointment for a venous duplex study is next Thursday. 01/23/2017 ---- lower extremity venous duplex reflux evaluation shows small saphenous vein is competent but there is evidence of great saphenous vein reflux in the right lower extremity with no evidence of DVT or SVT. There is also deep vein reflux in  the right lower extremity. A vascular consult was  recommended. Note the left side was not studied. Lower extremity arterial evaluation was done -- unable to obtain right dorsalis pedis or posterior tibial pressures due to an open wound. Posterior tibial and dorsalis pedis waveform appears strong and triphasic. Right TBI was 0.88. Left ABI was 1.21. [name], [name] ([id number]) 01/30/2017 -- he has an appointment to see the vascular surgeons at [facility] this coming Thursday. her Apligraf has been authorized by her [employer] and we will get this for her for next week. 2/22 2018 -- she was seen by Dr. Tawanna Cooler Early this morning -- she was found to have severe CEAP class VI venous stasis disease and had recommended laser ablation of her right great saphenous vein and stab phlebectomy's of multiple tributary varicosities to recent venous hypertension and improve her wound healing and reduce the risk of recurrent ulceration. She has had her first application of Apligraf today. 02/13/2017 -- her surgery has been scheduled for next Thursday and hence we will not see her to the Monday after. He will be prepared to apply an Apligraf on that day 02/24/2017 -- her surgery last Thursday was canceled and it has been scheduled for next Thursday. 03/06/2017 -- was seen today by Dr. Tawanna Cooler Early who reviewed a duplex which shows thrombosis of the great saphenous vein with no evidence of DVT -- He recommended she should continue with the wound clinic and would see her back on an as-needed basis. For various reasons the patient has not been here regularly, as been a month sincee her last application of Apligraf. Today she is had a second application of the Apligraf. 03/20/2017 -- she is here for her third application of Apligraf 04/02/2017 -- she was here for her fourth application of Apligraf but the wound has gotten much smaller and almost completely healed and hence we will not use any more  Apligraf Electronic Signature(s) Signed: 04/10/2017 2:16:20 PM By: Evlyn Kanner MD, FACS Entered By: Evlyn Kanner on 04/10/2017 14:16:20 [name], [name] ([id number]) -------------------------------------------------------------------------------- Physical Exam Details Patient Name: Marilyn Mins. Date of Service: 04/10/2017 1:15 PM Medical Record Number: [id number] Patient Account Number: [id number] Date of Birth/Sex: 1956/04/20 (61 y.o. Female) Treating RN: Ashok Cordia, Debi Primary Care Provider: Noemi Chapel Other Clinician: Referring Provider: Noemi Chapel Treating Provider/Extender: Rudene Re in Treatment: 20 Constitutional . Pulse regular. Respirations normal and unlabored. Afebrile. . Eyes Nonicteric. Reactive to light. Ears, Nose, Mouth, and Throat Lips, teeth, and gums WNL.Marland Kitchen Moist mucosa without lesions. Neck supple and nontender. No palpable supraclavicular or cervical adenopathy. Normal sized without goiter. Respiratory WNL. No retractions.. Breath sounds WNL, No rubs, rales, rhonchi, or wheeze.. Cardiovascular Heart rhythm and rate regular, no murmur or gallop.. Pedal Pulses WNL. No clubbing, cyanosis or edema. Chest Breasts symmetical and no nipple discharge.. Breast tissue WNL, no masses, lumps, or tenderness.. Lymphatic No adneopathy. No adenopathy. No adenopathy. Musculoskeletal Adexa without tenderness or enlargement.. Digits and nails w/o clubbing, cyanosis, infection, petechiae, ischemia, or inflammatory conditions.. Integumentary (Hair, Skin) No suspicious lesions. No crepitus or fluctuance. No peri-wound warmth or erythema. No masses.Marland Kitchen Psychiatric Judgement and insight Intact.. No evidence of depression, anxiety, or agitation.. Notes the wound continues to grow smaller and there is good epithelization from the edges and the open area is smaller than a couple of centimeters. She has a superficial abrasion in the region of the below-knee area  where the edge of the wrap could've caused an abrasion Electronic Signature(s) Signed: 04/10/2017 2:16:59 PM By: Evlyn Kanner MD, FACS Entered  By: Evlyn Kanner on 04/10/2017 14:16:58 [name], [name] ([id number]) -------------------------------------------------------------------------------- Physician Orders Details Patient Name: Marilyn Mins. Date of Service: 04/10/2017 1:15 PM Medical Record Number: [id number] Patient Account Number: [id number] Date of Birth/Sex: 1956-12-14 (61 y.o. Female) Treating RN: Ashok Cordia, Debi Primary Care Provider: Noemi Chapel Other Clinician: Referring Provider: Noemi Chapel Treating Provider/Extender: Rudene Re in Treatment: 20 Verbal / Phone Orders: Yes Clinician: Ashok Cordia, Debi Read Back and Verified: Yes Diagnosis Coding Wound Cleansing Wound #1 Right,Anterior Lower Leg o Clean wound with Normal Saline. o Cleanse wound with mild soap and water Wound #3 Right,Medial Lower Leg o Clean wound with Normal Saline. o Cleanse wound with mild soap and water Anesthetic Wound #1 Right,Anterior Lower Leg o Topical Lidocaine 4% cream applied to wound bed prior to debridement Wound #3 Right,Medial Lower Leg o Topical Lidocaine 4% cream applied to wound bed prior to debridement Primary Wound Dressing Wound #1 Right,Anterior Lower Leg o Hydrafera Blue Wound #3 Right,Medial Lower Leg o Foam Secondary Dressing Wound #1 Right,Anterior Lower Leg o ABD pad Dressing Change Frequency Wound #1 Right,Anterior Lower Leg o Change dressing every week Wound #3 Right,Medial Lower Leg o Change dressing every week Follow-up Appointments Wound #1 Right,Anterior Lower Leg Hettinger, Grier K. ([id number]) o Return Appointment in 1 week. o Nurse Visit as needed Wound #3 Right,Medial Lower Leg o Return Appointment in 1 week. o Nurse Visit as needed Edema Control Wound #1 Right,Anterior Lower Leg o 4-Layer Compression  System - Right Lower Extremity - unna to anchor Wound #3 Right,Medial Lower Leg o 4-Layer Compression System - Right Lower Extremity - unna to anchor Additional Orders / Instructions Wound #1 Right,Anterior Lower Leg o Stop Smoking o Increase protein intake. Wound #3 Right,Medial Lower Leg o Stop Smoking o Increase protein intake. Medications-please add to medication list. Wound #1 Right,Anterior Lower Leg o Other: - Vitamin C, Zinc, Vitamin A, MVI Wound #3 Right,Medial Lower Leg o Other: - Vitamin C, Zinc, Vitamin A, MVI Electronic Signature(s) Signed: 04/10/2017 4:50:03 PM By: Evlyn Kanner MD, FACS Entered By: Evlyn Kanner on 04/10/2017 14:17:20 [name], [name] ([id number]) -------------------------------------------------------------------------------- Problem List Details Patient Name: Marilyn Mins. Date of Service: 04/10/2017 1:15 PM Medical Record Number: [id number] Patient Account Number: [id number] Date of Birth/Sex: 1956-03-04 (61 y.o. 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 11/18/2016 Yes exposed I83.012 Varicose veins of right lower extremity with ulcer of calf 11/18/2016 Yes F17.218 Nicotine dependence, cigarettes, with other nicotine- 11/18/2016 Yes induced disorders M05.60 Rheumatoid arthritis of unspecified site with involvement 11/18/2016 Yes of other organs and systems Inactive Problems Resolved Problems Electronic Signature(s) Signed: 04/10/2017 2:15:52 PM By: Evlyn Kanner MD, FACS Entered By: Evlyn Kanner on 04/10/2017 14:15:52 [name], [name] ([id number]) -------------------------------------------------------------------------------- Progress Note  Details Patient Name: Marilyn Mins. Date of Service: 04/10/2017 1:15 PM Medical Record Number: [id number] Patient Account Number: [id number] Date of Birth/Sex: 06-Oct-1956 (61 y.o. Female) Treating RN: Ashok Cordia, Debi Primary Care Provider: Noemi Chapel Other Clinician: Referring Provider: Noemi Chapel Treating Provider/Extender: Rudene Re in Treatment: 20 Subjective Chief Complaint Information obtained from Patient Patient presents for treatment of an open ulcer due to venous insufficiency to her right lower extremity which she's had for over 6 months History of Present Illness (HPI) The following HPI elements were documented for the  patient's wound: Location: ulcerated area on the right lower extremity Quality: Patient reports experiencing a sharp pain to affected area(s). Severity: Patient states wound are getting worse. Duration: Patient has had the wound for > 6 months prior to seeking treatment at the wound center Timing: Pain in wound is constant (hurts all the time) Context: The wound appeared gradually over time Modifying Factors: Other treatment(s) tried include:recent admission to hospital for a cellulitis and was treated with antibiotics and an Unna boot Associated Signs and Symptoms: Patient reports having increase swelling. 61 year old patient who was recently admitted to the hospital between 11/14/2016 and 11/15/2016 for right lower extremity ulcer with cellulitis. she was asked to stop clindamycin and doxycycline and her prednisone and take Levaquin 500 mg daily by mouth. she was treated for a right lower extremity possibly due to venous stasis versus vascular insufficiency versus infection. She received IV vancomycin during her hospital stay. He is known to have rheumatoid arthritis and stopped taking her treatment because of lack of insurance. during her hospital stay vascular studies were done -- ABIs were 0.88 on the right and 1.1 on the left,  she had triphasic flow on both lower extremities and a plain film of the right tibia and fibula showed no signs of osteomyelitis. She is not a diabetic. She is a smoker however and continued to smoke. lab work noted that her sedimentation rate was 29, CRP was less than 0.8, hemoglobin A1c was 5.2 12/02/2016 -- due to a family emergency and social economic reasons she has been unable to keep her appointment with the vascular department for her workup and is expecting insurance coverage after January 1 12/19/2016 -- she continues to smoke. As have new insurance and hopefully will get her vascular test done as soon as possible. 12/26/2016 -- she has quit smoking for the last 3 days and I have commended her. She did receive her appointment from vein and vascular [place] and it is later this month 01/13/2017 -- she has not been back to see [place] since January 11 and had her compression wrap on all these days almost 3 weeks. I have sternly cautioned her regarding this. Her appointment for a venous [name], [name]. ([id number]) duplex study is next Thursday. 01/23/2017 ---- lower extremity venous duplex reflux evaluation shows small saphenous vein is competent but there is evidence of great saphenous vein reflux in the right lower extremity with no evidence of DVT or SVT. There is also deep vein reflux in the right lower extremity. A vascular consult was recommended. Note the left side was not studied. Lower extremity arterial evaluation was done -- unable to obtain right dorsalis pedis or posterior tibial pressures due to an open wound. Posterior tibial and dorsalis pedis waveform appears strong and triphasic. Right TBI was 0.88. Left ABI was 1.21. 01/30/2017 -- he has an appointment to see the vascular surgeons at [facility] this coming Thursday. her Apligraf has been authorized by her [employer] and we will get this for her for next week. 2/22 2018 -- she was seen by Dr. Tawanna Cooler Early this  morning -- she was found to have severe CEAP class VI venous stasis disease and had recommended laser ablation of her right great saphenous vein and stab phlebectomy's of multiple tributary varicosities to recent venous hypertension and improve her wound healing and reduce the risk of recurrent ulceration. She has had her first application of Apligraf today. 02/13/2017 -- her surgery has been scheduled for next Thursday and hence we will  not see her to the Monday after. He will be prepared to apply an Apligraf on that day 02/24/2017 -- her surgery last Thursday was canceled and it has been scheduled for next Thursday. 03/06/2017 -- was seen today by Dr. Tawanna Cooler Early who reviewed a duplex which shows thrombosis of the great saphenous vein with no evidence of DVT -- He recommended she should continue with the wound clinic and would see her back on an as-needed basis. For various reasons the patient has not been here regularly, as been a month sincee her last application of Apligraf. Today she is had a second application of the Apligraf. 03/20/2017 -- she is here for her third application of Apligraf 04/02/2017 -- she was here for her fourth application of Apligraf but the wound has gotten much smaller and almost completely healed and hence we will not use any more Apligraf Objective Constitutional Pulse regular. Respirations normal and unlabored. Afebrile. Vitals Time Taken: 1:31 PM, Height: 63 in, Weight: 214.7 lbs, BMI: 38, Temperature: 98.2 F, Pulse: 85 bpm, Respiratory Rate: 18 breaths/min, Blood Pressure: 172/78 mmHg. Eyes Nonicteric. Reactive to light. DEB, LOUDIN ([id number]) Ears, Nose, Mouth, and Throat Lips, teeth, and gums WNL.Marland Kitchen Moist mucosa without lesions. Neck supple and nontender. No palpable supraclavicular or cervical adenopathy. Normal sized without goiter. Respiratory WNL. No retractions.. Breath sounds WNL, No rubs, rales, rhonchi, or  wheeze.. Cardiovascular Heart rhythm and rate regular, no murmur or gallop.. Pedal Pulses WNL. No clubbing, cyanosis or edema. Chest Breasts symmetical and no nipple discharge.. Breast tissue WNL, no masses, lumps, or tenderness.. Lymphatic No adneopathy. No adenopathy. No adenopathy. Musculoskeletal Adexa without tenderness or enlargement.. Digits and nails w/o clubbing, cyanosis, infection, petechiae, ischemia, or inflammatory conditions.Marland Kitchen Psychiatric Judgement and insight Intact.. No evidence of depression, anxiety, or agitation.. General Notes: the wound continues to grow smaller and there is good epithelization from the edges and the open area is smaller than a couple of centimeters. She has a superficial abrasion in the region of the below-knee area where the edge of the wrap could've caused an abrasion Integumentary (Hair, Skin) No suspicious lesions. No crepitus or fluctuance. No peri-wound warmth or erythema. No masses.. Wound #1 status is Open. Original cause of wound was Gradually Appeared. The wound is located on the Right,Anterior Lower Leg. The wound measures 1.6cm length x 2.3cm width x 0.1cm depth; 2.89cm^2 area and 0.289cm^3 volume. There is Fat Layer (Subcutaneous Tissue) Exposed exposed. There is no tunneling or undermining noted. There is a large amount of serosanguineous drainage noted. The wound margin is distinct with the outline attached to the wound base. There is large (67-100%) red granulation within the wound bed. There is no necrotic tissue within the wound bed. The periwound skin appearance exhibited: Excoriation, Scarring, Ecchymosis, Erythema. The surrounding wound skin color is noted with erythema which is circumferential. Periwound temperature was noted as No Abnormality. The periwound has tenderness on palpation. Wound #3 status is Open. Original cause of wound was Gradually Appeared. The wound is located on the Right,Medial Lower Leg. The wound measures  0.3cm length x 1.9cm width x 0.1cm depth; 0.448cm^2 area and 0.045cm^3 volume. There is no tunneling or undermining noted. There is a large amount of serous drainage noted. The wound margin is distinct with the outline attached to the wound base. There is large (67-100%) red granulation within the wound bed. There is no necrotic tissue within the wound bed. The periwound skin appearance exhibited: Erythema. The surrounding wound skin color is noted  with erythema which is circumferential. Periwound temperature was noted as No Abnormality. The periwound has [name], [name] ([id number]) tenderness on palpation. Assessment Active Problems ICD-10 L97.212 - Non-pressure chronic ulcer of right calf with fat layer exposed I83.012 - Varicose veins of right lower extremity with ulcer of calf F17.218 - Nicotine dependence, cigarettes, with other nicotine-induced disorders M05.60 - Rheumatoid arthritis of unspecified site with involvement of other organs and systems Plan Wound Cleansing: Wound #1 Right,Anterior Lower Leg: Clean wound with Normal Saline. Cleanse wound with mild soap and water Wound #3 Right,Medial Lower Leg: Clean wound with Normal Saline. Cleanse wound with mild soap and water Anesthetic: Wound #1 Right,Anterior Lower Leg: Topical Lidocaine 4% cream applied to wound bed prior to debridement Wound #3 Right,Medial Lower Leg: Topical Lidocaine 4% cream applied to wound bed prior to debridement Primary Wound Dressing: Wound #1 Right,Anterior Lower Leg: Hydrafera Blue Wound #3 Right,Medial Lower Leg: Foam Secondary Dressing: Wound #1 Right,Anterior Lower Leg: ABD pad Dressing Change Frequency: Wound #1 Right,Anterior Lower Leg: Change dressing every week Wound #3 Right,Medial Lower Leg: Change dressing every week Follow-up Appointments: Wound #1 Right,Anterior Lower Leg: Return Appointment in 1 week. [name], [name] ([id number]) Nurse Visit as needed Wound #3  Right,Medial Lower Leg: Return Appointment in 1 week. Nurse Visit as needed Edema Control: Wound #1 Right,Anterior Lower Leg: 4-Layer Compression System - Right Lower Extremity - unna to anchor Wound #3 Right,Medial Lower Leg: 4-Layer Compression System - Right Lower Extremity - unna to anchor Additional Orders / Instructions: Wound #1 Right,Anterior Lower Leg: Stop Smoking Increase protein intake. Wound #3 Right,Medial Lower Leg: Stop Smoking Increase protein intake. Medications-please add to medication list.: Wound #1 Right,Anterior Lower Leg: Other: - Vitamin C, Zinc, Vitamin A, MVI Wound #3 Right,Medial Lower Leg: Other: - Vitamin C, Zinc, Vitamin A, MVI She has made excellent progress -- The wound base is clean . I have asked for [facility] and this will be covered with a 4-layer compression. elevation and exercise has been encouraged and she continues to stay off cigarettes. We will see her back for review next week Electronic Signature(s) Signed: 04/10/2017 2:18:23 PM By: Evlyn Kanner MD, FACS Entered By: Evlyn Kanner on 04/10/2017 14:18:23 [name], [name] ([id number]) -------------------------------------------------------------------------------- SuperBill Details Patient Name: Marilyn Mins. Date of Service: 04/10/2017 Medical Record Number: [id number] Patient Account Number: [id number] Date of Birth/Sex: 01/31/1956 (61 y.o. Female) Treating RN: Ashok Cordia, Debi Primary Care Provider: Noemi Chapel Other Clinician: Referring Provider: Noemi Chapel Treating Provider/Extender: Rudene Re in Treatment: 20 Diagnosis Coding ICD-10 Codes Code Description [phone] Non-pressure chronic ulcer of right calf with fat layer exposed I83.012 Varicose veins of right lower extremity with ulcer of calf F17.218 Nicotine dependence, cigarettes, with other nicotine-induced disorders M05.60 Rheumatoid arthritis of unspecified site with involvement of other organs and  systems Facility Procedures CPT4: Description Modifier Quantity Code [id number] (Facility Use Only) [id number] - APPLY MULTLAY COMPRS LWR RT 1 LEG Physician Procedures CPT4: Description Modifier Quantity Code [id number] 99213 - WC PHYS LEVEL 3 - EST PT 1 ICD-10 Description Diagnosis 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 layer exposed I83.012 Varicose veins of right lower extremity with ulcer of calf  M05.60 Rheumatoid arthritis of unspecified site with involvement of other organs and systems Electronic Signature(s) Signed: 04/10/2017 4:50:03 PM By: Evlyn Kanner MD, FACS Signed: 04/10/2017 5:04:43 PM By: Alejandro Mulling Previous Signature: 04/10/2017 2:19:28 PM Version By: Evlyn Kanner MD, FACS Entered By: Alejandro Mulling on 04/10/2017 16:05:23

## 2017-04-11 NOTE — Progress Notes (Signed)
TRACINA, BEAUMONT ([id number]) Visit Report for 04/10/2017 Arrival Information Details Patient Name: [name], [name]. Date of Service: 04/10/2017 1:15 PM Medical Record Number: [id number] Patient Account Number: [id number] Date of Birth/Sex: April 04, 1956 (60 y.o. Female) Treating RN: Carolyne Fiscal, Debi Primary Care Sarath Privott: Einar Gip Other Clinician: Referring Annslee Tercero: Einar Gip Treating David Towson/Extender: Frann Rider in Treatment: 20 Visit Information History Since Last Visit All ordered tests and consults were completed: No Patient Arrived: Ambulatory Added or deleted any medications: No Arrival Time: 13:30 Any new allergies or adverse reactions: No Accompanied By: self Had a fall or experienced change in No Transfer Assistance: None activities of daily living that may affect Patient Identification Verified: Yes risk of falls: Secondary Verification Process Yes Signs or symptoms of abuse/neglect since last No Completed: visito Patient Requires Transmission- No Hospitalized since last visit: No Based Precautions: Has Dressing in Place as Prescribed: Yes Patient Has Alerts: Yes Has Compression in Place as Prescribed: Yes Patient Alerts: L Leg ABI 1.2 Pain Present Now: No R Leg ABI 0.88 ABIs done in [place] Signature(s) Signed: 04/10/2017 5:04:43 PM By: Alric Quan Entered By: Alric Quan on 04/10/2017 13:31:29 Dorsi, [name] ([id number]) -------------------------------------------------------------------------------- Encounter Discharge Information Details Patient Name: [name]. Date of Service: 04/10/2017 1:15 PM Medical Record Number: [id number] Patient Account Number: [id number] Date of Birth/Sex: Sep 09, 1956 (61 y.o. Female) Treating RN: Carolyne Fiscal, Debi Primary Care Ayaat Jansma: Einar Gip Other Clinician: Referring Carver Murakami: Einar Gip Treating Kiernan Atkerson/Extender: Frann Rider in Treatment: 20 Encounter  Discharge Information Items Schedule Follow-up Appointment: No Medication Reconciliation completed No and provided to Patient/Care Everlynn Sagun: Provided on Clinical Summary of Care: 04/10/2017 Form Type Recipient Paper Patient JP Electronic Signature(s) Signed: 04/10/2017 2:04:39 PM By: Ruthine Dose Entered By: Ruthine Dose on 04/10/2017 14:04:39 Kruse, [name] ([id number]) -------------------------------------------------------------------------------- Lower Extremity Assessment Details Patient Name: [name]. Date of Service: 04/10/2017 1:15 PM Medical Record Number: [id number] Patient Account Number: [id number] Date of Birth/Sex: 10-Jun-1956 (61 y.o. Female) Treating RN: Carolyne Fiscal, Debi Primary Care Natalee Tomkiewicz: Einar Gip Other Clinician: Referring Chelcie Estorga: Einar Gip Treating Karrin Eisenmenger/Extender: Frann Rider in Treatment: 20 Edema Assessment Assessed: [Left: No] [Right: No] E[Left: dema] [Right: :] Calf Left: Right: Point of Measurement: 32 cm From Medial Instep cm 44.1 cm Ankle Left: Right: Point of Measurement: 9 cm From Medial Instep cm 25.2 cm Vascular Assessment Pulses: Dorsalis Pedis Palpable: [Right:Yes] Posterior Tibial Extremity colors, hair growth, and conditions: Extremity Color: [Right:Hyperpigmented] Temperature of Extremity: [Right:Warm] Capillary Refill: [Right:< 3 seconds] Toe Nail Assessment Left: Right: Thick: Yes Discolored: Yes Deformed: Yes Improper Length and Hygiene: Yes Electronic Signature(s) Signed: 04/10/2017 5:04:43 PM By: Alric Quan Entered By: Alric Quan on 04/10/2017 13:49:30 Farquharson, [name] ([id number]) -------------------------------------------------------------------------------- Multi Wound Chart Details Patient Name: [name]. Date of Service: 04/10/2017 1:15 PM Medical Record Number: [id number] Patient Account Number: [id number] Date of Birth/Sex: 1956/10/07 (62 y.o. Female) Treating  RN: Carolyne Fiscal, Debi Primary Care Angenette Daily: Einar Gip Other Clinician: Referring Shahan Starks: Einar Gip Treating Anniemae Haberkorn/Extender: Frann Rider in Treatment: 20 Vital Signs Height(in): 63 Pulse(bpm): 85 Weight(lbs): 214.7 Blood Pressure 172/78 (mmHg): Body Mass Index(BMI): 38 Temperature(F): 98.2 Respiratory Rate 18 (breaths/min): Photos: [1:No Photos] [3:No Photos] [N/A:N/A] Wound Location: [1:Right Lower Leg - Anterior Right Lower Leg - Medial] [N/A:N/A] Wounding Event: [1:Gradually Appeared] [3:Gradually Appeared] [N/A:N/A] Primary Etiology: [1:Venous Leg Ulcer] [3:Venous Leg Ulcer] [N/A:N/A] Comorbid History: [1:Chronic Obstructive Pulmonary Disease (COPD), Hypertension, Rheumatoid Arthritis] [3:Chronic Obstructive Pulmonary Disease (COPD), Hypertension, Rheumatoid Arthritis] [N/A:N/A] Date Acquired: [1:05/19/2016] [3:04/10/2017] [N/A:N/A] Weeks  of Treatment: [1:20] [3:0] [N/A:N/A] Wound Status: [1:Open] [3:Open] [N/A:N/A] Measurements L x W x D 1.6x2.3x0.1 [3:0.3x1.9x0.1] [N/A:N/A] (cm) Area (cm) : [1:2.89] [3:0.448] [N/A:N/A] Volume (cm) : [1:0.289] [3:0.045] [N/A:N/A] % Reduction in Area: [1:83.90%] [3:N/A] [N/A:N/A] % Reduction in Volume: 83.90% [3:N/A] [N/A:N/A] Classification: [1:Full Thickness Without Exposed Support Structures] [3:Partial Thickness] [N/A:N/A] Exudate Amount: [1:Large] [3:Large] [N/A:N/A] Exudate Type: [1:Serosanguineous] [3:Serous] [N/A:N/A] Exudate Color: [1:red, brown] [3:amber] [N/A:N/A] Wound Margin: [1:Distinct, outline attached Distinct, outline attached] [N/A:N/A] Granulation Amount: [1:Large (67-100%)] [3:Large (67-100%)] [N/A:N/A] Granulation Quality: [1:Red, Hyper-granulation] [3:Red] [N/A:N/A] Necrotic Amount: [1:None Present (0%)] [3:None Present (0%)] [N/A:N/A] Exposed Structures: [1:Fat Layer (Subcutaneous N/A Tissue) Exposed: Yes Fascia: No] [N/A:N/A] Tendon: No Muscle: No Joint: No Bone: No Epithelialization:  Medium (34-66%) None N/A Periwound Skin Texture: Excoriation: Yes No Abnormalities Noted N/A Scarring: Yes Periwound Skin No Abnormalities Noted No Abnormalities Noted N/A Moisture: Periwound Skin Color: Ecchymosis: Yes Erythema: Yes N/A Erythema: Yes Erythema Location: Circumferential Circumferential N/A Temperature: No Abnormality No Abnormality N/A Tenderness on Yes Yes N/A Palpation: Wound Preparation: Ulcer Cleansing: Ulcer Cleansing: N/A Rinsed/Irrigated with Rinsed/Irrigated with Saline, Other: soap and Saline, Other: soap and water water Topical Anesthetic Topical Anesthetic Applied: None, Other: Applied: Other: lidocaine lidocaine 4% 4% Treatment Notes Electronic Signature(s) Signed: 04/10/2017 2:16:02 PM By: Christin Fudge MD, FACS Entered By: Christin Fudge on 04/10/2017 14:16:02 Czerwonka, [name] ([id number]) -------------------------------------------------------------------------------- [place] Details Patient Name: [name], [name]. Date of Service: 04/10/2017 1:15 PM Medical Record Number: [id number] Patient Account Number: [id number] Date of Birth/Sex: 11-12-1956 (61 y.o. Female) Treating RN: Carolyne Fiscal, Debi Primary Care Mckenzi Buonomo: Einar Gip Other Clinician: Referring Shivangi Lutz: Einar Gip Treating Tauren Delbuono/Extender: Frann Rider in Treatment: 20 Active Inactive ` Abuse / Safety / Falls / Self Care Management Nursing Diagnoses: Potential for falls Goals: Patient will remain injury free Date Initiated: 11/18/2016 Target Resolution Date: 01/20/2017 Goal Status: Active Interventions: Assess fall risk on admission and as needed Assess self care needs on admission and as needed Notes: ` Nutrition Nursing Diagnoses: Imbalanced nutrition Goals: Patient/caregiver agrees to and verbalizes understanding of need to use nutritional supplements and/or vitamins as prescribed Date Initiated: 11/18/2016 Target Resolution Date:  01/20/2017 Goal Status: Active Interventions: Assess patient nutrition upon admission and as needed per policy Notes: ` Orientation to the Wound Care Program Nursing Diagnoses: MICHAL, STRZELECKI ([id number]) Knowledge deficit related to the wound healing center program Goals: Patient/caregiver will verbalize understanding of the [place] Date Initiated: 11/18/2016 Target Resolution Date: 01/20/2017 Goal Status: Active Interventions: Provide education on orientation to the wound center Notes: ` Pain, Acute or Chronic Nursing Diagnoses: Pain, acute or chronic: actual or potential Potential alteration in comfort, pain Goals: Patient will verbalize adequate pain control and receive pain control interventions during procedures as needed Date Initiated: 11/18/2016 Target Resolution Date: 01/20/2017 Goal Status: Active Patient/caregiver will verbalize adequate pain control between visits Date Initiated: 11/18/2016 Target Resolution Date: 01/20/2017 Goal Status: Active Patient/caregiver will verbalize comfort level met Date Initiated: 11/18/2016 Target Resolution Date: 01/20/2017 Goal Status: Active Interventions: Assess comfort goal upon admission Complete pain assessment as per visit requirements Notes: ` Soft Tissue Infection Nursing Diagnoses: Impaired tissue integrity Knowledge deficit related to disease process and management Knowledge deficit related to home infection control: handwashing, handling of soiled dressings, supply storage Goals: SARAHANNE, NOVAKOWSKI ([id number]) Patient/caregiver will verbalize understanding of or measures to prevent infection and contamination in the home setting Date Initiated: 11/18/2016 Target Resolution Date: 01/20/2017 Goal Status: Active Patient's soft tissue infection will  resolve Date Initiated: 11/18/2016 Target Resolution Date: 01/20/2017 Goal Status: Active Interventions: Assess signs and symptoms of infection every  visit Provide education on infection Treatment Activities: Education provided on Infection : 12/26/2016 Notes: ` Wound/Skin Impairment Nursing Diagnoses: Impaired tissue integrity Goals: Ulcer/skin breakdown will have a volume reduction of 30% by week 4 Date Initiated: 11/18/2016 Target Resolution Date: 01/20/2017 Goal Status: Active Ulcer/skin breakdown will have a volume reduction of 50% by week 8 Date Initiated: 11/18/2016 Target Resolution Date: 01/20/2017 Goal Status: Active Ulcer/skin breakdown will have a volume reduction of 80% by week 12 Date Initiated: 11/18/2016 Target Resolution Date: 01/20/2017 Goal Status: Active Interventions: Assess patient/caregiver ability to perform ulcer/skin care regimen upon admission and as needed Assess ulceration(s) every visit Provide education on smoking Notes: Electronic Signature(s) Signed: 04/10/2017 5:04:43 PM By: Alric Quan Entered By: Alric Quan on 04/10/2017 13:49:38 Christy, [name] ([id number]) -------------------------------------------------------------------------------- Pain Assessment Details Patient Name: [name]. Date of Service: 04/10/2017 1:15 PM Medical Record Number: [id number] Patient Account Number: [id number] Date of Birth/Sex: [birth year] (61 y.o. Female) Treating RN: Carolyne Fiscal, Debi Primary Care Trixie Maclaren: Einar Gip Other Clinician: Referring Annalysa Mohammad: Einar Gip Treating Jaeven Wanzer/Extender: Frann Rider in Treatment: 20 Active Problems Location of Pain Severity and Description of Pain Patient Has Paino No Site Locations With Dressing Change: No Pain Management and Medication Current Pain Management: Electronic Signature(s) Signed: 04/10/2017 5:04:43 PM By: Alric Quan Entered By: Alric Quan on 04/10/2017 13:31:36 Treml, [name] ([id number]) -------------------------------------------------------------------------------- Wound Assessment Details Patient Name:  [name]. Date of Service: 04/10/2017 1:15 PM Medical Record Number: [id number] Patient Account Number: [id number] Date of Birth/Sex: Dec 27, 1955 (61 y.o. Female) Treating RN: Carolyne Fiscal, Debi Primary Care Sandy Haye: Einar Gip Other Clinician: Referring Natalie Mceuen: Einar Gip Treating Million Maharaj/Extender: Frann Rider in Treatment: 20 Wound Status Wound Number: 1 Primary Venous Leg Ulcer Etiology: Wound Location: Right Lower Leg - Anterior Wound Open Wounding Event: Gradually Appeared Status: Date Acquired: 05/19/2016 Comorbid Chronic Obstructive Pulmonary Weeks Of Treatment: 20 History: Disease (COPD), Hypertension, Clustered Wound: No Rheumatoid Arthritis Photos Photo Uploaded By: Alric Quan on 04/10/2017 16:56:18 Wound Measurements Length: (cm) 1.6 Width: (cm) 2.3 Depth: (cm) 0.1 Area: (cm) 2.89 Volume: (cm) 0.289 % Reduction in Area: 83.9% % Reduction in Volume: 83.9% Epithelialization: Medium (34-66%) Tunneling: No Undermining: No Wound Description Full Thickness Without Exposed Classification: Support Structures Wound Margin: Distinct, outline attached Exudate Large Amount: Exudate Type: Serosanguineous Exudate Color: red, brown Foul Odor After Cleansing: No Slough/Fibrino No Wound Bed Granulation Amount: Large (67-100%) Exposed Structure Granulation Quality: Red, Hyper-granulation Fascia Exposed: No Dileonardo, Corbin K. ([id number]) Necrotic Amount: None Present (0%) Fat Layer (Subcutaneous Tissue) Exposed: Yes Tendon Exposed: No Muscle Exposed: No Joint Exposed: No Bone Exposed: No Periwound Skin Texture Texture Color No Abnormalities Noted: No No Abnormalities Noted: No Excoriation: Yes Ecchymosis: Yes Scarring: Yes Erythema: Yes Erythema Location: Circumferential Moisture No Abnormalities Noted: No Temperature / Pain Temperature: No Abnormality Tenderness on Palpation: Yes Wound Preparation Ulcer  Cleansing: Rinsed/Irrigated with Saline, Other: soap and water, Topical Anesthetic Applied: None, Other: lidocaine 4%, Electronic Signature(s) Signed: 04/10/2017 5:04:43 PM By: Alric Quan Entered By: Alric Quan on 04/10/2017 13:43:50 Cybulski, [name] ([id number]) -------------------------------------------------------------------------------- Wound Assessment Details Patient Name: [name]. Date of Service: 04/10/2017 1:15 PM Medical Record Number: [id number] Patient Account Number: [id number] Date of Birth/Sex: Apr 20, 1956 (61 y.o. Female) Treating RN: Carolyne Fiscal, Debi Primary Care Dalena Plantz: Einar Gip Other Clinician: Referring Blake Goya: Einar Gip Treating Peityn Payton/Extender: Frann Rider in Treatment: 20 Wound Status Wound  Number: 3 Primary Venous Leg Ulcer Etiology: Wound Location: Right Lower Leg - Medial Wound Open Wounding Event: Gradually Appeared Status: Date Acquired: 04/10/2017 Comorbid Chronic Obstructive Pulmonary Weeks Of Treatment: 0 History: Disease (COPD), Hypertension, Clustered Wound: No Rheumatoid Arthritis Photos Photo Uploaded By: Alric Quan on 04/10/2017 16:56:57 Wound Measurements Length: (cm) 0.3 Width: (cm) 1.9 Depth: (cm) 0.1 Area: (cm) 0.448 Volume: (cm) 0.045 % Reduction in Area: % Reduction in Volume: Epithelialization: None Tunneling: No Undermining: No Wound Description Classification: Partial Thickness Wound Margin: Distinct, outline attached Exudate Amount: Large Exudate Type: Serous Exudate Color: amber Foul Odor After Cleansing: No Wound Bed Granulation Amount: Large (67-100%) Granulation Quality: Red Necrotic Amount: None Present (0%) Warzecha, Ardel K. ([id number]) Periwound Skin Texture Texture Color No Abnormalities Noted: No No Abnormalities Noted: No Erythema: Yes Moisture Erythema Location: Circumferential No Abnormalities Noted: No Temperature / Pain Temperature: No  Abnormality Tenderness on Palpation: Yes Wound Preparation Ulcer Cleansing: Rinsed/Irrigated with Saline, Other: soap and water, Topical Anesthetic Applied: Other: lidocaine 4%, Electronic Signature(s) Signed: 04/10/2017 5:04:43 PM By: Alric Quan Entered By: Alric Quan on 04/10/2017 13:42:46 Sabine, [name] ([id number]) -------------------------------------------------------------------------------- Vitals Details Patient Name: [name] Date of Service: 04/10/2017 1:15 PM Medical Record Number: [id number] Patient Account Number: [id number] Date of Birth/Sex: [birth year] (61 y.o. Female) Treating RN: Carolyne Fiscal, Debi Primary Care Jmari Pelc: Einar Gip Other Clinician: Referring Cletus Paris: Einar Gip Treating Sorayah Schrodt/Extender: Frann Rider in Treatment: 20 Vital Signs Time Taken: 13:31 Temperature (F): 98.2 Height (in): 63 Pulse (bpm): 85 Weight (lbs): 214.7 Respiratory Rate (breaths/min): 18 Body Mass Index (BMI): 38 Blood Pressure (mmHg): 172/78 Reference Range: 80 - 120 mg / dl Electronic Signature(s) Signed: 04/10/2017 5:04:43 PM By: Alric Quan Entered By: Alric Quan on 04/10/2017 13:32:27

## 2017-04-17 ENCOUNTER — Encounter: Payer: BLUE CROSS/BLUE SHIELD | Attending: Surgery | Admitting: Surgery

## 2017-04-17 DIAGNOSIS — M0560 Rheumatoid arthritis of unspecified site with involvement of other organs and systems: Secondary | ICD-10-CM | POA: Insufficient documentation

## 2017-04-17 DIAGNOSIS — I83012 Varicose veins of right lower extremity with ulcer of calf: Secondary | ICD-10-CM | POA: Diagnosis present

## 2017-04-17 DIAGNOSIS — L97212 Non-pressure chronic ulcer of right calf with fat layer exposed: Secondary | ICD-10-CM | POA: Diagnosis not present

## 2017-04-17 DIAGNOSIS — I10 Essential (primary) hypertension: Secondary | ICD-10-CM | POA: Insufficient documentation

## 2017-04-17 DIAGNOSIS — J449 Chronic obstructive pulmonary disease, unspecified: Secondary | ICD-10-CM | POA: Insufficient documentation

## 2017-04-17 DIAGNOSIS — Z79899 Other long term (current) drug therapy: Secondary | ICD-10-CM | POA: Diagnosis not present

## 2017-04-17 DIAGNOSIS — F17218 Nicotine dependence, cigarettes, with other nicotine-induced disorders: Secondary | ICD-10-CM | POA: Insufficient documentation

## 2017-04-17 DIAGNOSIS — Z880 Allergy status to penicillin: Secondary | ICD-10-CM | POA: Insufficient documentation

## 2017-04-19 NOTE — Progress Notes (Signed)
[name] ([id number]) Visit Report for 04/17/2017 Arrival Information Details Patient Name: [name], [name]. Date of Service: 04/17/2017 3:30 PM Medical Record Number: [id number] Patient Account Number: [id number] Date of Birth/Sex: [birth year] (61 y.o. Female) Treating RN: Carolyne Fiscal, Debi Primary Care Oreatha Fabry: Einar Gip Other Clinician: Referring Deena Shaub: Einar Gip Treating Hermann Dottavio/Extender: Frann Rider in Treatment: 21 Visit Information History Since Last Visit All ordered tests and consults were completed: No Patient Arrived: Ambulatory Added or deleted any medications: No Arrival Time: 15:26 Had a fall or experienced change in No Accompanied By: self activities of daily living that may affect Transfer Assistance: None risk of falls: Patient Identification Verified: Yes Signs or symptoms of abuse/neglect since last No Secondary Verification Process Yes visito Completed: Hospitalized since last visit: No Patient Requires Transmission- No Has Dressing in Place as Prescribed: Yes Based Precautions: Pain Present Now: No Patient Has Alerts: Yes Patient Alerts: L Leg ABI 1.2 R Leg ABI 0.88 ABIs done in [place] Signature(s) Signed: 04/17/2017 4:23:06 PM By: Alric Quan Entered By: Alric Quan on 04/17/2017 15:27:23 Kerchner, [name] ([id number]) -------------------------------------------------------------------------------- Encounter Discharge Information Details Patient Name: [name]. Date of Service: 04/17/2017 3:30 PM Medical Record Number: [id number] Patient Account Number: [id number] Date of Birth/Sex: [birth year] (61 y.o. Female) Treating RN: Carolyne Fiscal, Debi Primary Care Viha Kriegel: Einar Gip Other Clinician: Referring Cristle Jared: Einar Gip Treating Tallan Sandoz/Extender: Frann Rider in Treatment: 21 Encounter Discharge Information Items Discharge Pain Level: 0 Discharge Condition: Stable Ambulatory  Status: Ambulatory Discharge Destination: Home Transportation: Private Auto Accompanied By: self Schedule Follow-up Appointment: Yes Medication Reconciliation completed and provided to Patient/Care No Aleesa Sweigert: Provided on Clinical Summary of Care: 04/17/2017 Form Type Recipient Paper Patient JP Electronic Signature(s) Signed: 04/17/2017 4:23:06 PM By: Alric Quan Previous Signature: 04/17/2017 3:59:42 PM Version By: Ruthine Dose Entered By: Alric Quan on 04/17/2017 16:06:53 Salyers, [name] ([id number]) -------------------------------------------------------------------------------- Lower Extremity Assessment Details Patient Name: [name]. Date of Service: 04/17/2017 3:30 PM Medical Record Number: [id number] Patient Account Number: [id number] Date of Birth/Sex: [birth year] (61 y.o. Female) Treating RN: Carolyne Fiscal, Debi Primary Care Cortlynn Hollinsworth: Einar Gip Other Clinician: Referring Amariana Mirando: Einar Gip Treating Mercedes Valeriano/Extender: Frann Rider in Treatment: 21 Edema Assessment Assessed: [Left: No] [Right: No] E[Left: dema] [Right: :] Calf Left: Right: Point of Measurement: 32 cm From Medial Instep 44.5 cm 43.5 cm Ankle Left: Right: Point of Measurement: 9 cm From Medial Instep 25.6 cm 24.7 cm Vascular Assessment Pulses: Dorsalis Pedis Palpable: [Right:Yes] Posterior Tibial Extremity colors, hair growth, and conditions: Extremity Color: [Right:Hyperpigmented] Temperature of Extremity: [Right:Warm] Capillary Refill: [Right:< 3 seconds] Toe Nail Assessment Left: Right: Thick: Yes Discolored: Yes Deformed: Yes Improper Length and Hygiene: Yes Notes heel to knee- 45cm Electronic Signature(s) Signed: 04/17/2017 4:23:06 PM By: Alric Quan Entered By: Alric Quan on 04/17/2017 16:05:10 Pardini, [name] ([id number]) Raczkowski, [name] ([id number]) -------------------------------------------------------------------------------- Multi Wound  Chart Details Patient Name: [name]. Date of Service: 04/17/2017 3:30 PM Medical Record Number: [id number] Patient Account Number: [id number] Date of Birth/Sex: 1956-11-08 (62 y.o. Female) Treating RN: Carolyne Fiscal, Debi Primary Care Nora Rooke: Einar Gip Other Clinician: Referring Detron Carras: Einar Gip Treating Dastan Krider/Extender: Frann Rider in Treatment: 21 Vital Signs Height(in): 63 Pulse(bpm): 93 Weight(lbs): 214.7 Blood Pressure 164/81 (mmHg): Body Mass Index(BMI): 38 Temperature(F): 98.2 Respiratory Rate 18 (breaths/min): Photos: [1:No Photos] [3:No Photos] [N/A:N/A] Wound Location: [1:Right Lower Leg - Anterior Right Lower Leg - Medial] [N/A:N/A] Wounding Event: [1:Gradually Appeared] [3:Gradually Appeared] [N/A:N/A] Primary Etiology: [1:Venous Leg Ulcer] [3:Venous Leg Ulcer] [N/A:N/A]  Comorbid History: [1:Chronic Obstructive Pulmonary Disease (COPD), Hypertension, Rheumatoid Arthritis] [3:Chronic Obstructive Pulmonary Disease (COPD), Hypertension, Rheumatoid Arthritis] [N/A:N/A] Date Acquired: [1:05/19/2016] [3:04/10/2017] [N/A:N/A] Weeks of Treatment: [1:21] [3:1] [N/A:N/A] Wound Status: [1:Open] [3:Open] [N/A:N/A] Measurements L x W x D 0.2x0.3x0.1 [3:0.3x1.7x0.1] [N/A:N/A] (cm) Area (cm) : [1:0.047] [3:0.401] [N/A:N/A] Volume (cm) : [1:0.005] [3:0.04] [N/A:N/A] % Reduction in Area: [1:99.70%] [3:10.50%] [N/A:N/A] % Reduction in Volume: 99.70% [3:11.10%] [N/A:N/A] Classification: [1:Full Thickness Without Exposed Support Structures] [3:Partial Thickness] [N/A:N/A] Exudate Amount: [1:Large] [3:Large] [N/A:N/A] Exudate Type: [1:Serosanguineous] [3:Serous] [N/A:N/A] Exudate Color: [1:red, brown] [3:amber] [N/A:N/A] Wound Margin: [1:Distinct, outline attached Distinct, outline attached] [N/A:N/A] Granulation Amount: [1:Large (67-100%)] [3:Large (67-100%)] [N/A:N/A] Granulation Quality: [1:Red, Hyper-granulation] [3:Red] [N/A:N/A] Necrotic Amount:  [1:None Present (0%)] [3:None Present (0%)] [N/A:N/A] Exposed Structures: [1:Fat Layer (Subcutaneous N/A Tissue) Exposed: Yes Fascia: No] [N/A:N/A] Tendon: No Muscle: No Joint: No Bone: No Epithelialization: Medium (34-66%) None N/A Periwound Skin Texture: Excoriation: Yes No Abnormalities Noted N/A Scarring: Yes Periwound Skin No Abnormalities Noted No Abnormalities Noted N/A Moisture: Periwound Skin Color: Ecchymosis: Yes Erythema: Yes N/A Erythema: Yes Erythema Location: Circumferential Circumferential N/A Temperature: No Abnormality No Abnormality N/A Tenderness on Yes Yes N/A Palpation: Wound Preparation: Ulcer Cleansing: Ulcer Cleansing: N/A Rinsed/Irrigated with Rinsed/Irrigated with Saline, Other: soap and Saline, Other: soap and water water Topical Anesthetic Topical Anesthetic Applied: None, Other: Applied: Other: lidocaine lidocaine 4% 4% Treatment Notes Electronic Signature(s) Signed: 04/17/2017 3:50:26 PM By: Christin Fudge MD, FACS Entered By: Christin Fudge on 04/17/2017 15:50:26 Fettes, [name] ([id number]) -------------------------------------------------------------------------------- [facility] Details Patient Name: SYRIA, KESTNER. Date of Service: 04/17/2017 3:30 PM Medical Record Number: [id number] Patient Account Number: [id number] Date of Birth/Sex: July 01, 1956 (61 y.o. Female) Treating RN: Carolyne Fiscal, Debi Primary Care Kyriana Yankee: Einar Gip Other Clinician: Referring Kaedyn Belardo: Einar Gip Treating Christino Mcglinchey/Extender: Frann Rider in Treatment: 21 Active Inactive ` Abuse / Safety / Falls / Self Care Management Nursing Diagnoses: Potential for falls Goals: Patient will remain injury free Date Initiated: 11/18/2016 Target Resolution Date: 01/20/2017 Goal Status: Active Interventions: Assess fall risk on admission and as needed Assess self care needs on admission and as needed Notes: ` Nutrition Nursing  Diagnoses: Imbalanced nutrition Goals: Patient/caregiver agrees to and verbalizes understanding of need to use nutritional supplements and/or vitamins as prescribed Date Initiated: 11/18/2016 Target Resolution Date: 01/20/2017 Goal Status: Active Interventions: Assess patient nutrition upon admission and as needed per policy Notes: ` Orientation to the Wound Care Program Nursing Diagnoses: CAIYA, BETTES ([id number]) Knowledge deficit related to the wound healing center program Goals: Patient/caregiver will verbalize understanding of the [facility] Date Initiated: 11/18/2016 Target Resolution Date: 01/20/2017 Goal Status: Active Interventions: Provide education on orientation to the wound center Notes: ` Pain, Acute or Chronic Nursing Diagnoses: Pain, acute or chronic: actual or potential Potential alteration in comfort, pain Goals: Patient will verbalize adequate pain control and receive pain control interventions during procedures as needed Date Initiated: 11/18/2016 Target Resolution Date: 01/20/2017 Goal Status: Active Patient/caregiver will verbalize adequate pain control between visits Date Initiated: 11/18/2016 Target Resolution Date: 01/20/2017 Goal Status: Active Patient/caregiver will verbalize comfort level met Date Initiated: 11/18/2016 Target Resolution Date: 01/20/2017 Goal Status: Active Interventions: Assess comfort goal upon admission Complete pain assessment as per visit requirements Notes: ` Soft Tissue Infection Nursing Diagnoses: Impaired tissue integrity Knowledge deficit related to disease process and management Knowledge deficit related to home infection control: handwashing, handling of soiled dressings, supply storage Goals: KIERRE, DEINES ([id number]) Patient/caregiver will verbalize understanding of or  measures to prevent infection and contamination in the home setting Date Initiated: 11/18/2016 Target Resolution Date:  01/20/2017 Goal Status: Active Patient's soft tissue infection will resolve Date Initiated: 11/18/2016 Target Resolution Date: 01/20/2017 Goal Status: Active Interventions: Assess signs and symptoms of infection every visit Provide education on infection Treatment Activities: Education provided on Infection : 11/18/2016 Notes: ` Wound/Skin Impairment Nursing Diagnoses: Impaired tissue integrity Goals: Ulcer/skin breakdown will have a volume reduction of 30% by week 4 Date Initiated: 11/18/2016 Target Resolution Date: 01/20/2017 Goal Status: Active Ulcer/skin breakdown will have a volume reduction of 50% by week 8 Date Initiated: 11/18/2016 Target Resolution Date: 01/20/2017 Goal Status: Active Ulcer/skin breakdown will have a volume reduction of 80% by week 12 Date Initiated: 11/18/2016 Target Resolution Date: 01/20/2017 Goal Status: Active Interventions: Assess patient/caregiver ability to perform ulcer/skin care regimen upon admission and as needed Assess ulceration(s) every visit Provide education on smoking Notes: Electronic Signature(s) Signed: 04/17/2017 4:23:06 PM By: Alric Quan Entered By: Alric Quan on 04/17/2017 15:37:09 Lackman, [name] ([id number]) -------------------------------------------------------------------------------- Pain Assessment Details Patient Name: [name]. Date of Service: 04/17/2017 3:30 PM Medical Record Number: [id number] Patient Account Number: [id number] Date of Birth/Sex: 03-07-1956 (61 y.o. Female) Treating RN: Carolyne Fiscal, Debi Primary Care Tykira Wachs: Einar Gip Other Clinician: Referring Javae Braaten: Einar Gip Treating Kordae Buonocore/Extender: Frann Rider in Treatment: 21 Active Problems Location of Pain Severity and Description of Pain Patient Has Paino No Site Locations With Dressing Change: No Pain Management and Medication Current Pain Management: Electronic Signature(s) Signed: 04/17/2017 4:23:06 PM By: Alric Quan Entered By: Alric Quan on 04/17/2017 15:27:30 Vanderwall, [name] ([id number]) -------------------------------------------------------------------------------- Patient/Caregiver Education Details Patient Name: [name]. Date of Service: 04/17/2017 3:30 PM Medical Record Number: [id number] Patient Account Number: [id number] Date of Birth/Gender: 1956-03-16 (61 y.o. Female) Treating RN: Carolyne Fiscal, Debi Primary Care Physician: Einar Gip Other Clinician: Referring Physician: Einar Gip Treating Physician/Extender: Frann Rider in Treatment: 21 Education Assessment Education Provided To: Patient Education Topics Provided Wound/Skin Impairment: Handouts: Other: change dressing as ordered Methods: Demonstration, Explain/Verbal Responses: State content correctly Electronic Signature(s) Signed: 04/17/2017 4:23:06 PM By: Alric Quan Entered By: Alric Quan on 04/17/2017 16:07:03 Neely, [name] ([id number]) -------------------------------------------------------------------------------- Wound Assessment Details Patient Name: [name]. Date of Service: 04/17/2017 3:30 PM Medical Record Number: [id number] Patient Account Number: [id number] Date of Birth/Sex: 1956-02-11 (61 y.o. Female) Treating RN: Carolyne Fiscal, Debi Primary Care Vernell Back: Einar Gip Other Clinician: Referring Talibah Colasurdo: Einar Gip Treating Shivan Hodes/Extender: Frann Rider in Treatment: 21 Wound Status Wound Number: 1 Primary Venous Leg Ulcer Etiology: Wound Location: Right Lower Leg - Anterior Wound Open Wounding Event: Gradually Appeared Status: Date Acquired: 05/19/2016 Comorbid Chronic Obstructive Pulmonary Weeks Of Treatment: 21 History: Disease (COPD), Hypertension, Clustered Wound: No Rheumatoid Arthritis Photos Photo Uploaded By: Alric Quan on 04/18/2017 07:54:21 Wound Measurements Length: (cm) 0.2 Width: (cm) 0.3 Depth: (cm) 0.1 Area: (cm)  0.047 Volume: (cm) 0.005 % Reduction in Area: 99.7% % Reduction in Volume: 99.7% Epithelialization: Medium (34-66%) Tunneling: No Undermining: No Wound Description Full Thickness Without Exposed Classification: Support Structures Wound Margin: Distinct, outline attached Exudate Large Amount: Exudate Type: Serosanguineous Exudate Color: red, brown Foul Odor After Cleansing: No Slough/Fibrino No Wound Bed Granulation Amount: Large (67-100%) Exposed Structure Granulation Quality: Red, Hyper-granulation Fascia Exposed: No Wolaver, Hanny K. ([id number]) Necrotic Amount: None Present (0%) Fat Layer (Subcutaneous Tissue) Exposed: Yes Tendon Exposed: No Muscle Exposed: No Joint Exposed: No Bone Exposed: No Periwound Skin Texture Texture Color No Abnormalities Noted: No No Abnormalities  Noted: No Excoriation: Yes Ecchymosis: Yes Scarring: Yes Erythema: Yes Erythema Location: Circumferential Moisture No Abnormalities Noted: No Temperature / Pain Temperature: No Abnormality Tenderness on Palpation: Yes Wound Preparation Ulcer Cleansing: Rinsed/Irrigated with Saline, Other: soap and water, Topical Anesthetic Applied: None, Other: lidocaine 4%, Treatment Notes Wound #1 (Right, Anterior Lower Leg) 1. Cleansed with: Clean wound with Normal Saline Cleanse wound with antibacterial soap and water 2. Anesthetic Topical Lidocaine 4% cream to wound bed prior to debridement 4. Dressing Applied: Foam 7. Secured with Tape 4-Layer Compression System - Right Lower Extremity Notes unna to anchor Electronic Signature(s) Signed: 04/17/2017 4:23:06 PM By: Alric Quan Entered By: Alric Quan on 04/17/2017 15:34:53 Peake, [name] ([id number]) -------------------------------------------------------------------------------- Wound Assessment Details Patient Name: [name]. Date of Service: 04/17/2017 3:30 PM Medical Record Number: [id number] Patient Account Number:  [id number] Date of Birth/Sex: 03-06-1956 (61 y.o. Female) Treating RN: Carolyne Fiscal, Debi Primary Care Aqsa Sensabaugh: Einar Gip Other Clinician: Referring Holly Iannaccone: Einar Gip Treating Uziah Sorter/Extender: Frann Rider in Treatment: 21 Wound Status Wound Number: 3 Primary Venous Leg Ulcer Etiology: Wound Location: Right Lower Leg - Medial Wound Open Wounding Event: Gradually Appeared Status: Date Acquired: 04/10/2017 Comorbid Chronic Obstructive Pulmonary Weeks Of Treatment: 1 History: Disease (COPD), Hypertension, Clustered Wound: No Rheumatoid Arthritis Photos Photo Uploaded By: Alric Quan on 04/18/2017 07:54:40 Wound Measurements Length: (cm) 0.3 Width: (cm) 1.7 Depth: (cm) 0.1 Area: (cm) 0.401 Volume: (cm) 0.04 % Reduction in Area: 10.5% % Reduction in Volume: 11.1% Epithelialization: None Tunneling: No Undermining: No Wound Description Classification: Partial Thickness Wound Margin: Distinct, outline attached Exudate Amount: Large Exudate Type: Serous Exudate Color: amber Foul Odor After Cleansing: No Wound Bed Granulation Amount: Large (67-100%) Granulation Quality: Red Necrotic Amount: None Present (0%) Heino, Katira K. ([id number]) Periwound Skin Texture Texture Color No Abnormalities Noted: No No Abnormalities Noted: No Erythema: Yes Moisture Erythema Location: Circumferential No Abnormalities Noted: No Temperature / Pain Temperature: No Abnormality Tenderness on Palpation: Yes Wound Preparation Ulcer Cleansing: Rinsed/Irrigated with Saline, Other: soap and water, Topical Anesthetic Applied: Other: lidocaine 4%, Treatment Notes Wound #3 (Right, Medial Lower Leg) 1. Cleansed with: Clean wound with Normal Saline Cleanse wound with antibacterial soap and water 2. Anesthetic Topical Lidocaine 4% cream to wound bed prior to debridement 4. Dressing Applied: Foam 7. Secured with Tape 4-Layer Compression System - Right Lower  Extremity Notes unna to anchor Electronic Signature(s) Signed: 04/17/2017 4:23:06 PM By: Alric Quan Entered By: Alric Quan on 04/17/2017 15:35:31 Popelka, [name] ([id number]) -------------------------------------------------------------------------------- Vitals Details Patient Name: [name]. Date of Service: 04/17/2017 3:30 PM Medical Record Number: [id number] Patient Account Number: [id number] Date of Birth/Sex: 05/23/1956 (61 y.o. Female) Treating RN: Carolyne Fiscal, Debi Primary Care Airen Dales: Einar Gip Other Clinician: Referring Kevonte Vanecek: Einar Gip Treating Jewel Mcafee/Extender: Frann Rider in Treatment: 21 Vital Signs Time Taken: 15:27 Temperature (F): 98.2 Height (in): 63 Pulse (bpm): 93 Weight (lbs): 214.7 Respiratory Rate (breaths/min): 18 Body Mass Index (BMI): 38 Blood Pressure (mmHg): 164/81 Reference Range: 80 - 120 mg / dl Electronic Signature(s) Signed: 04/17/2017 4:23:06 PM By: Alric Quan Entered By: Alric Quan on 04/17/2017 15:28:51

## 2017-04-19 NOTE — Progress Notes (Signed)
JENISIS, HARMSEN ([id number]) Visit Report for 04/17/2017 Chief Complaint Document Details Patient Name: [name], [name]. Date of Service: 04/17/2017 3:30 PM Medical Record Number: [id number] Patient Account Number: [id number] Date of Birth/Sex: [birth year] (61 y.o. Female) Treating RN: Ashok Cordia, Debi Primary Care Provider: Noemi Chapel Other Clinician: Referring Provider: Noemi Chapel Treating Provider/Extender: Rudene Re in Treatment: 21 Information Obtained from: Patient Chief Complaint Patient presents for treatment of an open ulcer due to venous insufficiency to her right lower extremity which she's had for over 6 months Electronic Signature(s) Signed: 04/17/2017 3:50:35 PM By: Evlyn Kanner MD, FACS Entered By: Evlyn Kanner on 04/17/2017 15:50:34 Wilmer, [name] ([id number]) -------------------------------------------------------------------------------- HPI Details Patient Name: [name]. Date of Service: 04/17/2017 3:30 PM Medical Record Number: [id number] Patient Account Number: [id number] Date of Birth/Sex: June 13, 1956 (61 y.o. Female) Treating RN: Ashok Cordia, Debi Primary Care Provider: Noemi Chapel Other Clinician: Referring Provider: Noemi Chapel Treating Provider/Extender: Rudene Re in Treatment: 21 History of Present Illness Location: ulcerated area on the right lower extremity Quality: Patient reports experiencing a sharp pain to affected area(s). Severity: Patient states wound are getting worse. Duration: Patient has had the wound for > 6 months prior to seeking treatment at the wound center Timing: Pain in wound is constant (hurts all the time) Context: The wound appeared gradually over time Modifying Factors: Other treatment(s) tried include:recent admission to hospital for a cellulitis and was treated with antibiotics and an Unna boot Associated Signs and Symptoms: Patient reports having increase swelling. HPI Description:  61 year old patient who was recently admitted to the hospital between 11/14/2016 and 11/15/2016 for right lower extremity ulcer with cellulitis. she was asked to stop clindamycin and doxycycline and her prednisone and take Levaquin 500 mg daily by mouth. she was treated for a right lower extremity possibly due to venous stasis versus vascular insufficiency versus infection. She received IV vancomycin during her hospital stay. He is known to have rheumatoid arthritis and stopped taking her treatment because of lack of insurance. during her hospital stay vascular studies were done -- ABIs were 0.88 on the right and 1.1 on the left, she had triphasic flow on both lower extremities and a plain film of the right tibia and fibula showed no signs of osteomyelitis. She is not a diabetic. She is a smoker however and continued to smoke. lab work noted that her sedimentation rate was 29, CRP was less than 0.8, hemoglobin A1c was 5.2 12/02/2016 -- due to a family emergency and social economic reasons she has been unable to keep her appointment with the vascular department for her workup and is expecting insurance coverage after January 1 12/19/2016 -- she continues to smoke. As have new insurance and hopefully will get her vascular test done as soon as possible. 12/26/2016 -- she has quit smoking for the last 3 days and I have commended her. She did receive her appointment from vein and vascular [place] and it is later this month 01/13/2017 -- she has not been back to see [place] since January 11 and had her compression wrap on all these days almost 3 weeks. I have sternly cautioned her regarding this. Her appointment for a venous duplex study is next Thursday. 01/23/2017 ---- lower extremity venous duplex reflux evaluation shows small saphenous vein is competent but there is evidence of great saphenous vein reflux in the right lower extremity with no evidence of DVT or SVT. There is also deep vein reflux in  the right lower extremity. A vascular consult was  recommended. Note the left side was not studied. Lower extremity arterial evaluation was done -- unable to obtain right dorsalis pedis or posterior tibial pressures due to an open wound. Posterior tibial and dorsalis pedis waveform appears strong and triphasic. Right TBI was 0.88. Left ABI was 1.21. RUE, KRABBENHOFT ([id number]) 01/30/2017 -- he has an appointment to see the vascular surgeons at [facility] this coming Thursday. her Apligraf has been authorized by her [employer] and we will get this for her for next week. 2/22 2018 -- she was seen by Dr. Tawanna Cooler Early this morning -- she was found to have severe CEAP class VI venous stasis disease and had recommended laser ablation of her right great saphenous vein and stab phlebectomy's of multiple tributary varicosities to recent venous hypertension and improve her wound healing and reduce the risk of recurrent ulceration. She has had her first application of Apligraf today. 02/13/2017 -- her surgery has been scheduled for next Thursday and hence we will not see her to the Monday after. He will be prepared to apply an Apligraf on that day 02/24/2017 -- her surgery last Thursday was canceled and it has been scheduled for next Thursday. 03/06/2017 -- was seen today by Dr. Tawanna Cooler Early who reviewed a duplex which shows thrombosis of the great saphenous vein with no evidence of DVT -- He recommended she should continue with the wound clinic and would see her back on an as-needed basis. For various reasons the patient has not been here regularly, as been a month sincee her last application of Apligraf. Today she is had a second application of the Apligraf. 03/20/2017 -- she is here for her third application of Apligraf 04/02/2017 -- she was here for her fourth application of Apligraf but the wound has gotten much smaller and almost completely healed and hence we will not use any more  Apligraf Electronic Signature(s) Signed: 04/17/2017 3:50:48 PM By: Evlyn Kanner MD, FACS Entered By: Evlyn Kanner on 04/17/2017 15:50:48 Stukey, [name] ([id number]) -------------------------------------------------------------------------------- Physical Exam Details Patient Name: [name]. Date of Service: 04/17/2017 3:30 PM Medical Record Number: [id number] Patient Account Number: [id number] Date of Birth/Sex: June 17, 1956 (61 y.o. Female) Treating RN: Ashok Cordia, Debi Primary Care Provider: Noemi Chapel Other Clinician: Referring Provider: Noemi Chapel Treating Provider/Extender: Rudene Re in Treatment: 21 Constitutional . Pulse regular. Respirations normal and unlabored. Afebrile. . Eyes Nonicteric. Reactive to light. Ears, Nose, Mouth, and Throat Lips, teeth, and gums WNL.Marland Kitchen Moist mucosa without lesions. Neck supple and nontender. No palpable supraclavicular or cervical adenopathy. Normal sized without goiter. Respiratory WNL. No retractions.. Cardiovascular Pedal Pulses WNL. No clubbing, cyanosis or edema. Lymphatic No adneopathy. No adenopathy. No adenopathy. Musculoskeletal Adexa without tenderness or enlargement.. Digits and nails w/o clubbing, cyanosis, infection, petechiae, ischemia, or inflammatory conditions.. Integumentary (Hair, Skin) No suspicious lesions. No crepitus or fluctuance. No peri-wound warmth or erythema. No masses.Marland Kitchen Psychiatric Judgement and insight Intact.. No evidence of depression, anxiety, or agitation.. Notes The wound is looking excellent with only 3-4 mm of open area towards the center of a healthy epithelialized scar. The rest of her lymphedema is much better. Electronic Signature(s) Signed: 04/17/2017 3:52:25 PM By: Evlyn Kanner MD, FACS Entered By: Evlyn Kanner on 04/17/2017 15:52:24 Winton, [name] ([id number]) -------------------------------------------------------------------------------- Physician Orders  Details Patient Name: ARIYAN, CLEMENS. Date of Service: 04/17/2017 3:30 PM Medical Record Number: [id number] Patient Account Number: [id number] Date of Birth/Sex: June 19, 1956 (61 y.o. Female) Treating RN: Ashok Cordia, Debi Primary Care Provider: Noemi Chapel Other Clinician: Referring  Provider: Noemi Chapel Treating Provider/Extender: Rudene Re in Treatment: 21 Verbal / Phone Orders: Yes Clinician: Ashok Cordia, Debi Read Back and Verified: Yes Diagnosis Coding Wound Cleansing Wound #1 Right,Anterior Lower Leg o Clean wound with Normal Saline. o Cleanse wound with mild soap and water Wound #3 Right,Medial Lower Leg o Clean wound with Normal Saline. o Cleanse wound with mild soap and water Anesthetic Wound #1 Right,Anterior Lower Leg o Topical Lidocaine 4% cream applied to wound bed prior to debridement Wound #3 Right,Medial Lower Leg o Topical Lidocaine 4% cream applied to wound bed prior to debridement Primary Wound Dressing Wound #1 Right,Anterior Lower Leg o Foam Wound #3 Right,Medial Lower Leg o Foam Dressing Change Frequency Wound #1 Right,Anterior Lower Leg o Change dressing every week Wound #3 Right,Medial Lower Leg o Change dressing every week Follow-up Appointments Wound #1 Right,Anterior Lower Leg o Return Appointment in 1 week. o Nurse Visit as needed Wound #3 Right,Medial Lower Leg Estis, Areyanna K. ([id number]) o Return Appointment in 1 week. o Nurse Visit as needed Edema Control Wound #1 Right,Anterior Lower Leg o 4-Layer Compression System - Right Lower Extremity - unna to anchor Wound #3 Right,Medial Lower Leg o 4-Layer Compression System - Right Lower Extremity - unna to anchor Additional Orders / Instructions Wound #1 Right,Anterior Lower Leg o Stop Smoking o Increase protein intake. o Other: - order compression stockings Wound #3 Right,Medial Lower Leg o Stop Smoking o Increase protein  intake. o Other: - order compression stockings Medications-please add to medication list. Wound #1 Right,Anterior Lower Leg o Other: - Vitamin C, Zinc, Vitamin A, MVI Wound #3 Right,Medial Lower Leg o Other: - Vitamin C, Zinc, Vitamin A, MVI Electronic Signature(s) Signed: 04/17/2017 3:54:55 PM By: Evlyn Kanner MD, FACS Signed: 04/17/2017 4:23:06 PM By: Alejandro Mulling Entered By: Alejandro Mulling on 04/17/2017 15:54:30 Beamon, [name] ([id number]) -------------------------------------------------------------------------------- Problem List Details Patient Name: [name], [name]. Date of Service: 04/17/2017 3:30 PM Medical Record Number: [id number] Patient Account Number: [id number] Date of Birth/Sex: 11-11-1956 (61 y.o. 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 11/18/2016 Yes exposed I83.012 Varicose veins of right lower extremity with ulcer of calf 11/18/2016 Yes F17.218 Nicotine dependence, cigarettes, with other nicotine- 11/18/2016 Yes induced disorders M05.60 Rheumatoid arthritis of unspecified site with involvement 11/18/2016 Yes of other organs and systems Inactive Problems Resolved Problems Electronic Signature(s) Signed: 04/17/2017 3:50:19 PM By: Evlyn Kanner MD, FACS Entered By: Evlyn Kanner on 04/17/2017 15:50:19 Mcartor, [name] ([id number]) -------------------------------------------------------------------------------- Progress Note Details Patient Name: [name]. Date of Service: 04/17/2017 3:30 PM Medical Record Number: [id number] Patient Account Number: [id number] Date of Birth/Sex: 12-14-1956 (61 y.o. Female) Treating RN: Ashok Cordia, Debi Primary Care Provider: Noemi Chapel Other Clinician: Referring Provider: Noemi Chapel Treating Provider/Extender: Rudene Re in Treatment: 21 Subjective Chief Complaint Information obtained from Patient Patient presents for treatment of an open ulcer due to venous insufficiency to her right lower extremity which she's had for over 6 months History of Present Illness (HPI) The following HPI elements were documented for the patient's wound: Location: ulcerated area on the right lower extremity Quality: Patient reports experiencing a sharp pain to affected area(s). Severity: Patient states wound are getting worse. Duration: Patient has had the wound for > 6 months prior to seeking treatment at the wound  center Timing: Pain in wound is constant (hurts all the time) Context: The wound appeared gradually over time Modifying Factors: Other treatment(s) tried include:recent admission to hospital for a cellulitis and was treated with antibiotics and an Unna boot Associated Signs and Symptoms: Patient reports having increase swelling. 61 year old patient who was recently admitted to the hospital between 11/14/2016 and 11/15/2016 for right lower extremity ulcer with cellulitis. she was asked to stop clindamycin and doxycycline and her prednisone and take Levaquin 500 mg daily by mouth. she was treated for a right lower extremity possibly due to venous stasis versus vascular insufficiency versus infection. She received IV vancomycin during her hospital stay. He is known to have rheumatoid arthritis and stopped taking her treatment because of lack of insurance. during her hospital stay vascular studies were done -- ABIs were 0.88 on the right and 1.1 on the left, she had triphasic flow on both lower extremities and a plain film of the right tibia and fibula showed no signs of osteomyelitis. She is not a diabetic. She is a smoker however and continued to smoke. lab work noted that her sedimentation rate was 29, CRP  was less than 0.8, hemoglobin A1c was 5.2 12/02/2016 -- due to a family emergency and social economic reasons she has been unable to keep her appointment with the vascular department for her workup and is expecting insurance coverage after January 1 12/19/2016 -- she continues to smoke. As have new insurance and hopefully will get her vascular test done as soon as possible. 12/26/2016 -- she has quit smoking for the last 3 days and I have commended her. She did receive her appointment from vein and vascular [place] and it is later this month 01/13/2017 -- she has not been back to see [place] since January 11 and had her compression wrap on all these days almost 3 weeks. I have sternly cautioned her regarding this. Her appointment for a venous [name], BENETT. ([id number]) duplex study is next Thursday. 01/23/2017 ---- lower extremity venous duplex reflux evaluation shows small saphenous vein is competent but there is evidence of great saphenous vein reflux in the right lower extremity with no evidence of DVT or SVT. There is also deep vein reflux in the right lower extremity. A vascular consult was recommended. Note the left side was not studied. Lower extremity arterial evaluation was done -- unable to obtain right dorsalis pedis or posterior tibial pressures due to an open wound. Posterior tibial and dorsalis pedis waveform appears strong and triphasic. Right TBI was 0.88. Left ABI was 1.21. 01/30/2017 -- he has an appointment to see the vascular surgeons at [facility] this coming Thursday. her Apligraf has been authorized by her [employer] and we will get this for her for next week. 2/22 2018 -- she was seen by Dr. Tawanna Cooler Early this morning -- she was found to have severe CEAP class VI venous stasis disease and had recommended laser ablation of her right great saphenous vein and stab phlebectomy's of multiple tributary varicosities to recent venous hypertension and improve her  wound healing and reduce the risk of recurrent ulceration. She has had her first application of Apligraf today. 02/13/2017 -- her surgery has been scheduled for next Thursday and hence we will not see her to the Monday after. He will be prepared to apply an Apligraf on that day 02/24/2017 -- her surgery last Thursday was canceled and it has been scheduled for next Thursday. 03/06/2017 -- was seen today by Dr. Tawanna Cooler Early who  reviewed a duplex which shows thrombosis of the great saphenous vein with no evidence of DVT -- He recommended she should continue with the wound clinic and would see her back on an as-needed basis. For various reasons the patient has not been here regularly, as been a month sincee her last application of Apligraf. Today she is had a second application of the Apligraf. 03/20/2017 -- she is here for her third application of Apligraf 04/02/2017 -- she was here for her fourth application of Apligraf but the wound has gotten much smaller and almost completely healed and hence we will not use any more Apligraf Objective Constitutional Pulse regular. Respirations normal and unlabored. Afebrile. Vitals Time Taken: 3:27 PM, Height: 63 in, Weight: 214.7 lbs, BMI: 38, Temperature: 98.2 F, Pulse: 93 bpm, Respiratory Rate: 18 breaths/min, Blood Pressure: 164/81 mmHg. Eyes Nonicteric. Reactive to light. [name], [name] ([id number]) Ears, Nose, Mouth, and Throat Lips, teeth, and gums WNL.Marland Kitchen Moist mucosa without lesions. Neck supple and nontender. No palpable supraclavicular or cervical adenopathy. Normal sized without goiter. Respiratory WNL. No retractions.. Cardiovascular Pedal Pulses WNL. No clubbing, cyanosis or edema. Lymphatic No adneopathy. No adenopathy. No adenopathy. Musculoskeletal Adexa without tenderness or enlargement.. Digits and nails w/o clubbing, cyanosis, infection, petechiae, ischemia, or inflammatory conditions.Marland Kitchen Psychiatric Judgement and insight  Intact.. No evidence of depression, anxiety, or agitation.. General Notes: The wound is looking excellent with only 3-4 mm of open area towards the center of a healthy epithelialized scar. The rest of her lymphedema is much better. Integumentary (Hair, Skin) No suspicious lesions. No crepitus or fluctuance. No peri-wound warmth or erythema. No masses.. Wound #1 status is Open. Original cause of wound was Gradually Appeared. The wound is located on the Right,Anterior Lower Leg. The wound measures 0.2cm length x 0.3cm width x 0.1cm depth; 0.047cm^2 area and 0.005cm^3 volume. There is Fat Layer (Subcutaneous Tissue) Exposed exposed. There is no tunneling or undermining noted. There is a large amount of serosanguineous drainage noted. The wound margin is distinct with the outline attached to the wound base. There is large (67-100%) red granulation within the wound bed. There is no necrotic tissue within the wound bed. The periwound skin appearance exhibited: Excoriation, Scarring, Ecchymosis, Erythema. The surrounding wound skin color is noted with erythema which is circumferential. Periwound temperature was noted as No Abnormality. The periwound has tenderness on palpation. Wound #3 status is Open. Original cause of wound was Gradually Appeared. The wound is located on the Right,Medial Lower Leg. The wound measures 0.3cm length x 1.7cm width x 0.1cm depth; 0.401cm^2 area and 0.04cm^3 volume. There is no tunneling or undermining noted. There is a large amount of serous drainage noted. The wound margin is distinct with the outline attached to the wound base. There is large (67-100%) red granulation within the wound bed. There is no necrotic tissue within the wound bed. The periwound skin appearance exhibited: Erythema. The surrounding wound skin color is noted with erythema which is circumferential. Periwound temperature was noted as No Abnormality. The periwound has tenderness on palpation. [name], [name] ([id number]) Assessment Active Problems ICD-10 [phone] - Non-pressure chronic ulcer of right calf with fat layer exposed I83.012 - Varicose veins of right lower extremity with ulcer of calf F17.218 - Nicotine dependence, cigarettes, with other nicotine-induced disorders M05.60 - Rheumatoid arthritis of unspecified site with involvement of other organs and systems Plan Wound Cleansing: Wound #1 Right,Anterior Lower Leg: Clean wound with Normal Saline. Cleanse wound with mild soap and water Wound #3 Right,Medial  Lower Leg: Clean wound with Normal Saline. Cleanse wound with mild soap and water Anesthetic: Wound #1 Right,Anterior Lower Leg: Topical Lidocaine 4% cream applied to wound bed prior to debridement Wound #3 Right,Medial Lower Leg: Topical Lidocaine 4% cream applied to wound bed prior to debridement Primary Wound Dressing: Wound #1 Right,Anterior Lower Leg: Foam Wound #3 Right,Medial Lower Leg: Foam Dressing Change Frequency: Wound #1 Right,Anterior Lower Leg: Change dressing every week Wound #3 Right,Medial Lower Leg: Change dressing every week Follow-up Appointments: Wound #1 Right,Anterior Lower Leg: Return Appointment in 1 week. Nurse Visit as needed Wound #3 Right,Medial Lower Leg: Return Appointment in 1 week. Nurse Visit as needed Edema Control: Wound #1 Right,Anterior Lower Leg: PRISCILLA, KIRSTEIN ([id number]) 4-Layer Compression System - Right Lower Extremity - unna to anchor Wound #3 Right,Medial Lower Leg: 4-Layer Compression System - Right Lower Extremity - unna to anchor Additional Orders / Instructions: Wound #1 Right,Anterior Lower Leg: Stop Smoking Increase protein intake. Other: - order compression stockings Wound #3 Right,Medial Lower Leg: Stop Smoking Increase protein intake. Other: - order compression stockings Medications-please add to medication list.: Wound #1 Right,Anterior Lower Leg: Other: - Vitamin C, Zinc, Vitamin A,  MVI Wound #3 Right,Medial Lower Leg: Other: - Vitamin C, Zinc, Vitamin A, MVI Over the last couple of weeks her progress has been excellent and the wound is almost completely closed. I have asked for foam and this will be covered with a 4-layer compression. elevation and exercise has been encouraged and she continues to stay off cigarettes. In anticipation of her discharge soon, we will order her a 2 layer compression 30-40 mm and also juxta lites. We will see her back for review next week. Electronic Signature(s) Signed: 04/17/2017 4:00:48 PM By: Evlyn Kanner MD, FACS Previous Signature: 04/17/2017 3:54:19 PM Version By: Evlyn Kanner MD, FACS Entered By: Evlyn Kanner on 04/17/2017 16:00:48 Bridgers, [name] ([id number]) -------------------------------------------------------------------------------- SuperBill Details Patient Name: [name]. Date of Service: 04/17/2017 Medical Record Number: [id number] Patient Account Number: [id number] Date of Birth/Sex: 06-05-1956 (61 y.o. Female) Treating RN: Ashok Cordia, Debi Primary Care Provider: Noemi Chapel Other Clinician: Referring Provider: Noemi Chapel Treating Provider/Extender: Rudene Re in Treatment: 21 Diagnosis Coding ICD-10 Codes Code Description [phone] Non-pressure chronic ulcer of right calf with fat layer exposed I83.012 Varicose veins of right lower extremity with ulcer of calf F17.218 Nicotine dependence, cigarettes, with other nicotine-induced disorders M05.60 Rheumatoid arthritis of unspecified site with involvement of other organs and systems Facility Procedures CPT4: Description Modifier Quantity Code [id number] (Facility Use Only) [phone] - APPLY MULTLAY COMPRS LWR RT 1 LEG Physician Procedures CPT4: Description Modifier Quantity Code [id number] - WC PHYS LEVEL 3 - EST PT 1 ICD-10 Description Diagnosis 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 layer exposed I83.012 Varicose veins of right  lower extremity with ulcer of calf  F17.218 Nicotine dependence, cigarettes, with other nicotine-induced disorders M05.60 Rheumatoid arthritis of unspecified site with involvement of other organs and systems Electronic Signature(s) Signed: 04/17/2017 4:22:29 PM By: Evlyn Kanner MD, FACS Signed: 04/17/2017 4:23:06 PM By: Alejandro Mulling Previous Signature: 04/17/2017 3:54:36 PM Version By: Evlyn Kanner MD, FACS Entered By: Alejandro Mulling on 04/17/2017 16:02:43

## 2017-04-24 ENCOUNTER — Encounter: Payer: BLUE CROSS/BLUE SHIELD | Admitting: Surgery

## 2017-04-24 DIAGNOSIS — I83012 Varicose veins of right lower extremity with ulcer of calf: Secondary | ICD-10-CM | POA: Diagnosis not present

## 2017-04-26 NOTE — Progress Notes (Signed)
Marilyn, [name] ([id number]) Visit Report for 04/24/2017 Arrival Information Details Patient Name: [name], [name]. Date of Service: 04/24/2017 3:30 PM Medical Record Number: [id number] Patient Account Number: [id number] Date of Birth/Sex: 1956/01/19 (62 y.o. Female) Treating [name]: Huel Coventry Primary Care [name]: [name] Other Clinician: Referring [name]: [name] Treating [name]/Extender: [name] in Treatment: 22 Visit Information History Since Last Visit Added or deleted any medications: No Patient Arrived: Ambulatory Any new allergies or adverse reactions: No Arrival Time: 15:37 Had a fall or experienced change in No Accompanied By: self activities of daily living that may affect Transfer Assistance: None risk of falls: Patient Identification Verified: Yes Signs or symptoms of abuse/neglect since last No Secondary Verification Process Yes visito Completed: Hospitalized since last visit: No Patient Requires Transmission- No Has Dressing in Place as Prescribed: Yes Based Precautions: Has Compression in Place as Prescribed: Yes Patient Has Alerts: Yes Pain Present Now: No Patient Alerts: L Leg ABI 1.2 R Leg ABI 0.88 ABIs done in Hospital Electronic Signature(s) Signed: 04/24/2017 4:30:01 PM By: Marilyn Gurney, [name], BSN, [name], BSN Entered By: Marilyn Gurney, [name], BSN, Marilyn on 04/24/2017 15:38:11 [name], [name] ([id number]) -------------------------------------------------------------------------------- Clinic Level of Care Assessment Details Patient Name: Marilyn Mins. Date of Service: 04/24/2017 3:30 PM Medical Record Number: [id number] Patient Account Number: [id number] Date of Birth/Sex: March 26, 1956 (61 y.o. Female) Treating [name]: Huel Coventry Primary Care [name]: [name] Other Clinician: Referring [name]: [name] Treating Suellyn Meenan/Extender: [name] in Treatment: 22 Clinic Level of Care Assessment Items TOOL 4 Quantity  Score []  - Use when only an EandM is performed on FOLLOW-UP visit 0 ASSESSMENTS - Nursing Assessment / Reassessment []  - Reassessment of Co-morbidities (includes updates in patient status) 0 X - Reassessment of Adherence to Treatment Plan 1 5 ASSESSMENTS - Wound and Skin Assessment / Reassessment X - Simple Wound Assessment / Reassessment - one wound 1 5 []  - Complex Wound Assessment / Reassessment - multiple wounds 0 []  - Dermatologic / Skin Assessment (not related to wound area) 0 ASSESSMENTS - Focused Assessment []  - Circumferential Edema Measurements - multi extremities 0 []  - Nutritional Assessment / Counseling / Intervention 0 []  - Lower Extremity Assessment (monofilament, tuning fork, pulses) 0 []  - Peripheral Arterial Disease Assessment (using hand held doppler) 0 ASSESSMENTS - Ostomy and/or Continence Assessment and Care []  - Incontinence Assessment and Management 0 []  - Ostomy Care Assessment and Management (repouching, etc.) 0 PROCESS - Coordination of Care X - Simple Patient / Family Education for ongoing care 1 15 []  - Complex (extensive) Patient / Family Education for ongoing care 0 []  - Staff obtains Chiropractor, Records, Test Results / Process Orders 0 []  - Staff telephones HHA, Nursing Homes / Clarify orders / etc 0 []  - Routine Transfer to another Facility (non-emergent condition) 0 Marilyn, [name] ([id number]) []  - Routine Hospital Admission (non-emergent condition) 0 []  - New Admissions / Manufacturing engineer / Ordering NPWT, Apligraf, etc. 0 []  - Emergency Hospital Admission (emergent condition) 0 X - Simple Discharge Coordination 1 10 []  - Complex (extensive) Discharge Coordination 0 PROCESS - Special Needs []  - Pediatric / Minor Patient Management 0 []  - Isolation Patient Management 0 []  - Hearing / Language / Visual special needs 0 []  - Assessment of Community assistance (transportation, D/C planning, etc.) 0 []  - Additional assistance / Altered mentation  0 []  - Support Surface(s) Assessment (bed, cushion, seat, etc.) 0 INTERVENTIONS - Wound Cleansing / Measurement []  - Simple Wound Cleansing - one  wound 0 []  - Complex Wound Cleansing - multiple wounds 0 X - Wound Imaging (photographs - any number of wounds) 1 5 []  - Wound Tracing (instead of photographs) 0 []  - Simple Wound Measurement - one wound 0 []  - Complex Wound Measurement - multiple wounds 0 INTERVENTIONS - Wound Dressings []  - Small Wound Dressing one or multiple wounds 0 []  - Medium Wound Dressing one or multiple wounds 0 []  - Large Wound Dressing one or multiple wounds 0 []  - Application of Medications - topical 0 []  - Application of Medications - injection 0 INTERVENTIONS - Miscellaneous []  - External ear exam 0 [name], [name] ([id number]) []  - Specimen Collection (cultures, biopsies, blood, body fluids, etc.) 0 []  - Specimen(s) / Culture(s) sent or taken to Lab for analysis 0 []  - Patient Transfer (multiple staff / Michiel Sites Lift / Similar devices) 0 []  - Simple Staple / Suture removal (25 or less) 0 []  - Complex Staple / Suture removal (26 or more) 0 []  - Hypo / Hyperglycemic Management (close monitor of Blood Glucose) 0 []  - Ankle / Brachial Index (ABI) - do not check if billed separately 0 X - Vital Signs 1 5 Has the patient been seen at the hospital within the last three years: Yes Total Score: 45 Level Of Care: New/Established - Level 2 Electronic Signature(s) Signed: 04/24/2017 4:30:01 PM By: Marilyn Gurney, [name], BSN, [name], BSN Entered By: Marilyn Gurney, [name], BSN, Marilyn on 04/24/2017 16:13:10 [name], [name] ([id number]) -------------------------------------------------------------------------------- Encounter Discharge Information Details Patient Name: Marilyn Mins. Date of Service: 04/24/2017 3:30 PM Medical Record Number: [id number] Patient Account Number: [id number] Date of Birth/Sex: Aug 05, 1956 (61 y.o. Female) Treating [name]: Huel Coventry Primary Care Peace Jost: [name] Other Clinician: Referring Tequita Marrs: [name] Treating Lariza Cothron/Extender: [name] in Treatment: 22 Encounter Discharge Information Items Discharge Pain Level: 0 Discharge Condition: Stable Ambulatory Status: Ambulatory Discharge Destination: Home Transportation: Private Auto Accompanied By: self Schedule Follow-up Appointment: Yes Medication Reconciliation completed and provided to Patient/Care Yes Legrand Lasser: Provided on Clinical Summary of Care: 04/24/2017 Form Type Recipient Paper Patient JP Electronic Signature(s) Signed: 04/24/2017 4:30:01 PM By: Marilyn Gurney [name], BSN, [name], BSN Previous Signature: 04/24/2017 4:12:34 PM Version By: Gwenlyn Perking Entered By: Marilyn Gurney [name], BSN, Marilyn on 04/24/2017 16:28:56 [name], [name] ([id number]) -------------------------------------------------------------------------------- Lower Extremity Assessment Details Patient Name: Marilyn Mins. Date of Service: 04/24/2017 3:30 PM Medical Record Number: [id number] Patient Account Number: [id number] Date of Birth/Sex: Sep 07, 1956 (61 y.o. Female) Treating [name]: Huel Coventry Primary Care Aryn Kops: [name] Other Clinician: Referring Timi Reeser: [name] Treating Mark Benecke/Extender: [name] in Treatment: 22 Edema Assessment Assessed: [Left: No] [Right: No] E[Left: dema] [Right: :] Calf Left: Right: Point of Measurement: 32 cm From Medial Instep cm 45.5 cm Ankle Left: Right: Point of Measurement: 9 cm From Medial Instep cm 24.8 cm Vascular Assessment Pulses: Dorsalis Pedis Palpable: [Right:Yes] Posterior Tibial Palpable: [Right:Yes] Extremity colors, hair growth, and conditions: Extremity Color: [Right:Hyperpigmented] Hair Growth on Extremity: [Right:Yes] Temperature of Extremity: [Right:Warm] Capillary Refill: [Right:< 3 seconds] Toe Nail Assessment Left: Right: Thick: Yes Discolored: Yes Deformed: Yes Improper Length and Hygiene: Yes Electronic  Signature(s) Signed: 04/24/2017 4:30:01 PM By: Marilyn Gurney, [name], BSN, [name], BSN Entered By: Marilyn Gurney, [name], BSN, Marilyn on 04/24/2017 15:50:24 [name], [name] ([id number]) [name], [name] ([id number]) -------------------------------------------------------------------------------- Multi Wound Chart Details Patient Name: Marilyn Mins. Date of Service: 04/24/2017 3:30 PM Medical Record Number: [id number] Patient Account Number: [id number] Date of Birth/Sex: 11/15/1956 (61 y.o. Female) Treating [name]:  Huel Coventry Primary Care Lejuan Botto: [name] Other Clinician: Referring Gale Klar: [name] Treating Kaylem Gidney/Extender: [name] in Treatment: 22 Vital Signs Height(in): 63 Pulse(bpm): 88 Weight(lbs): 214.7 Blood Pressure 160/78 (mmHg): Body Mass Index(BMI): 38 Temperature(F): 98.4 Respiratory Rate 16 (breaths/min): Wound Assessments Treatment Notes Electronic Signature(s) Signed: 04/24/2017 4:10:25 PM By: Evlyn Kanner MD, FACS Entered By: Evlyn Kanner on 04/24/2017 16:10:25 [place], [name] ([id number]) -------------------------------------------------------------------------------- Multi-Disciplinary Care Plan Details Patient Name: [name], [name]. Date of Service: 04/24/2017 3:30 PM Medical Record Number: [id number] Patient Account Number: [id number] Date of Birth/Sex: 1956/06/29 (61 y.o. Female) Treating [name]: Huel Coventry Primary Care Addeline Calarco: [name] Other Clinician: Referring Caio Devera: [name] Treating Caitlen Worth/Extender: [name] in Treatment: 22 Active Inactive Electronic Signature(s) Signed: 04/24/2017 4:30:01 PM By: Marilyn Gurney, [name], BSN, [name], BSN Entered By: Marilyn Gurney, [name], BSN, Marilyn on 04/24/2017 16:11:19 Mena, [name] ([id number]) -------------------------------------------------------------------------------- Pain Assessment Details Patient Name: [name], [name]. Date of Service: 04/24/2017 3:30 PM Medical Record Number:  [id number] Patient Account Number: [id number] Date of Birth/Sex: Apr 03, 1956 (61 y.o. Female) Treating [name]: Huel Coventry Primary Care Stonewall Doss: [name] Other Clinician: Referring Ferris Fielden: [name] Treating [name]/Extender: [name] in Treatment: 22 Active Problems Location of Pain Severity and Description of Pain Patient Has Paino No Site Locations With Dressing Change: No Pain Management and Medication Current Pain Management: Electronic Signature(s) Signed: 04/24/2017 4:30:01 PM By: Marilyn Gurney, [name], BSN, [name], BSN Entered By: Marilyn Gurney, [name], BSN, Marilyn on 04/24/2017 15:38:19 Aamodt, [name] ([id number]) -------------------------------------------------------------------------------- Patient/Caregiver Education Details Patient Name: Marilyn Mins. Date of Service: 04/24/2017 3:30 PM Medical Record Number: [id number] Patient Account Number: [id number] Date of Birth/Gender: September 08, 1956 (61 y.o. Female) Treating [name]: Huel Coventry Primary Care Physician: [name] Other Clinician: Referring Physician: Noemi [name] Treating Physician/Extender: [name] in Treatment: 22 Education Assessment Education Provided To: Patient Education Topics Provided Venous: Handouts: Controlling Swelling with Compression Stockings Methods: Demonstration, Explain/Verbal Responses: State content correctly Electronic Signature(s) Signed: 04/24/2017 4:30:01 PM By: Marilyn Gurney, [name], BSN, [name], BSN Entered By: Marilyn Gurney, [name], BSN, Marilyn on 04/24/2017 16:29:13 Edelman, [name] ([id number]) -------------------------------------------------------------------------------- Wound Assessment Details Patient Name: [name], [name]. Date of Service: 04/24/2017 3:30 PM Medical Record Number: [id number] Patient Account Number: [id number] Date of Birth/Sex: Mar 23, 1956 (61 y.o. Female) Treating [name]: Huel Coventry Primary Care Lakita Sahlin: [name] Other Clinician: Referring Jasalyn Frysinger: [name] Treating Elen Acero/Extender: [name] in Treatment: 22 Wound Status Wound Number: 1 Primary Etiology: Venous Leg Ulcer Wound Location: Right, Anterior Lower Leg Wound Status: Healed - Epithelialized Wounding Event: Gradually Appeared Date Acquired: 05/19/2016 Weeks Of Treatment: 22 Clustered Wound: No Photos Photo Uploaded By: Marilyn Gurney, [name], BSN, Marilyn on 04/24/2017 16:44:27 Wound Measurements Length: (cm) 0 % Reducti Width: (cm) 0 % Reducti Depth: (cm) 0 Area: (cm) 0 Volume: (cm) 0 on in Area: 100% on in Volume: 100% Wound Description Full Thickness Without Exposed Classification: Support Structures Periwound Skin Texture Texture Color No Abnormalities Noted: No No Abnormalities Noted: No Moisture No Abnormalities Noted: No Electronic Signature(s) Signed: 04/24/2017 4:30:01 PM By: Marilyn Gurney, [name], BSN, [name], BSN Entered By: Marilyn Gurney, [name], BSN, Marilyn on 04/24/2017 16:28:20 Everman, [name] ([id number]) Douty, [name] ([id number]) -------------------------------------------------------------------------------- Wound Assessment Details Patient Name: [name], [name]. Date of Service: 04/24/2017 3:30 PM Medical Record Number: [id number] Patient Account Number: [id number] Date of Birth/Sex: February 01, 1956 (61 y.o. Female) Treating [name]: Huel Coventry Primary Care Gerrad Welker: [name] Other Clinician: Referring Kristelle Cavallaro: [name] Treating Shikira Folino/Extender: [name] in Treatment: 22 Wound  Status Wound Number: 3 Primary Etiology: Venous Leg Ulcer Wound Location: Right, Medial Lower Leg Wound Status: Healed - Epithelialized Wounding Event: Gradually Appeared Date Acquired: 04/10/2017 Weeks Of Treatment: 2 Clustered Wound: No Photos Photo Uploaded By: Marilyn Gurney, [name], BSN, Marilyn on 04/24/2017 16:44:28 Wound Measurements Length: (cm) 0 % Reduction in Width: (cm) 0 % Reduction in Depth: (cm) 0 Area: (cm) 0 Volume: (cm) 0 Area: 100% Volume: 100% Wound  Description Classification: Partial Thickness Periwound Skin Texture Texture Color No Abnormalities Noted: No No Abnormalities Noted: No Moisture No Abnormalities Noted: No Electronic Signature(s) Signed: 04/24/2017 4:30:01 PM By: Marilyn Gurney, [name], BSN, [name], BSN Entered By: Marilyn Gurney, [name], BSN, Marilyn on 04/24/2017 16:28:20 Izzo, [name] ([id number]) -------------------------------------------------------------------------------- Vitals Details Patient Name: Marilyn Mins. Date of Service: 04/24/2017 3:30 PM Medical Record Number: [id number] Patient Account Number: [id number] Date of Birth/Sex: 1956-03-17 (61 y.o. Female) Treating [name]: Huel Coventry Primary Care Jovanni Rash: [name] Other Clinician: Referring Syesha Thaw: [name] Treating Demetrica Zipp/Extender: [name] in Treatment: 22 Vital Signs Time Taken: 15:38 Temperature (F): 98.4 Height (in): 63 Pulse (bpm): 88 Weight (lbs): 214.7 Respiratory Rate (breaths/min): 16 Body Mass Index (BMI): 38 Blood Pressure (mmHg): 160/78 Reference Range: 80 - 120 mg / dl Electronic Signature(s) Signed: 04/24/2017 4:30:01 PM By: Marilyn Gurney, [name], BSN, [name], BSN Entered By: Marilyn Gurney, [name], BSN, Marilyn on 04/24/2017 15:39:00

## 2017-04-26 NOTE — Progress Notes (Addendum)
BETSIE, PECKMAN ([id number]) Visit Report for 04/24/2017 Chief Complaint Document Details Patient Name: [name], [name]. Date of Service: 04/24/2017 3:30 PM Medical Record Number: [id number] Patient Account Number: [id number] Date of Birth/Sex: [birth year] (61 y.o. Female) Treating RN: Huel Coventry Primary Care Provider: Noemi Chapel Other Clinician: Referring Provider: Noemi Chapel Treating Provider/Extender: Rudene Re in Treatment: 22 Information Obtained from: Patient Chief Complaint Patient presents for treatment of an open ulcer due to venous insufficiency to her right lower extremity which she's had for over 6 months Electronic Signature(s) Signed: 04/24/2017 4:10:32 PM By: Evlyn Kanner MD, FACS Entered By: Evlyn Kanner on 04/24/2017 16:10:32 [name], [name] ([id number]) -------------------------------------------------------------------------------- HPI Details Patient Name: Marilyn Mins. Date of Service: 04/24/2017 3:30 PM Medical Record Number: [id number] Patient Account Number: [id number] Date of Birth/Sex: 1955/12/30 (60 y.o. Female) Treating RN: Huel Coventry Primary Care Provider: Noemi Chapel Other Clinician: Referring Provider: Noemi Chapel Treating Provider/Extender: Rudene Re in Treatment: 22 History of Present Illness Location: ulcerated area on the right lower extremity Quality: Patient reports experiencing a sharp pain to affected area(s). Severity: Patient states wound are getting worse. Duration: Patient has had the wound for > 6 months prior to seeking treatment at the wound center Timing: Pain in wound is constant (hurts all the time) Context: The wound appeared gradually over time Modifying Factors: Other treatment(s) tried include:recent admission to hospital for a cellulitis and was treated with antibiotics and an Unna boot Associated Signs and Symptoms: Patient reports having increase swelling. HPI Description: 61 year old  patient who was recently admitted to the hospital between 11/14/2016 and 11/15/2016 for right lower extremity ulcer with cellulitis. she was asked to stop clindamycin and doxycycline and her prednisone and take Levaquin 500 mg daily by mouth. she was treated for a right lower extremity possibly due to venous stasis versus vascular insufficiency versus infection. She received IV vancomycin during her hospital stay. He is known to have rheumatoid arthritis and stopped taking her treatment because of lack of insurance. during her hospital stay vascular studies were done -- ABIs were 0.88 on the right and 1.1 on the left, she had triphasic flow on both lower extremities and a plain film of the right tibia and fibula showed no signs of osteomyelitis. She is not a diabetic. She is a smoker however and continued to smoke. lab work noted that her sedimentation rate was 29, CRP was less than 0.8, hemoglobin A1c was 5.2 12/02/2016 -- due to a family emergency and social economic reasons she has been unable to keep her appointment with the vascular department for her workup and is expecting insurance coverage after January 1 12/19/2016 -- she continues to smoke. As have new insurance and hopefully will get her vascular test done as soon as possible. 12/26/2016 -- she has quit smoking for the last 3 days and I have commended her. She did receive her appointment from vein and vascular [place] and it is later this month 01/13/2017 -- she has not been back to see [place] since January 11 and had her compression wrap on all these days almost 3 weeks. I have sternly cautioned her regarding this. Her appointment for a venous duplex study is next Thursday. 01/23/2017 ---- lower extremity venous duplex reflux evaluation shows small saphenous vein is competent but there is evidence of great saphenous vein reflux in the right lower extremity with no evidence of DVT or SVT. There is also deep vein reflux in the right  lower extremity. A vascular consult was  recommended. Note the left side was not studied. Lower extremity arterial evaluation was done -- unable to obtain right dorsalis pedis or posterior tibial pressures due to an open wound. Posterior tibial and dorsalis pedis waveform appears strong and triphasic. Right TBI was 0.88. Left ABI was 1.21. [name], [name] ([id number]) 01/30/2017 -- he has an appointment to see the vascular surgeons at [name] this coming Thursday. her Apligraf has been authorized by her [employer] and we will get this for her for next week. 2/22 2018 -- she was seen by Dr. Tawanna Cooler Early this morning -- she was found to have severe CEAP class VI venous stasis disease and had recommended laser ablation of her right great saphenous vein and stab phlebectomy's of multiple tributary varicosities to recent venous hypertension and improve her wound healing and reduce the risk of recurrent ulceration. She has had her first application of Apligraf today. 02/13/2017 -- her surgery has been scheduled for next Thursday and hence we will not see her to the Monday after. He will be prepared to apply an Apligraf on that day 02/24/2017 -- her surgery last Thursday was canceled and it has been scheduled for next Thursday. 03/06/2017 -- was seen today by Dr. Tawanna Cooler Early who reviewed a duplex which shows thrombosis of the great saphenous vein with no evidence of DVT -- He recommended she should continue with the wound clinic and would see her back on an as-needed basis. For various reasons the patient has not been here regularly, as been a month sincee her last application of Apligraf. Today she is had a second application of the Apligraf. 03/20/2017 -- she is here for her third application of Apligraf 04/02/2017 -- she was here for her fourth application of Apligraf but the wound has gotten much smaller and almost completely healed and hence we will not use any more Apligraf Electronic  Signature(s) Signed: 04/24/2017 4:10:40 PM By: Evlyn Kanner MD, FACS Entered By: Evlyn Kanner on 04/24/2017 16:10:40 [name], [name] ([id number]) -------------------------------------------------------------------------------- Physical Exam Details Patient Name: Marilyn Mins. Date of Service: 04/24/2017 3:30 PM Medical Record Number: [id number] Patient Account Number: [id number] Date of Birth/Sex: 1956/10/04 (61 y.o. Female) Treating RN: Huel Coventry Primary Care Provider: Noemi Chapel Other Clinician: Referring Provider: Noemi Chapel Treating Provider/Extender: Rudene Re in Treatment: 22 Constitutional . Pulse regular. Respirations normal and unlabored. Afebrile. . Eyes Nonicteric. Reactive to light. Ears, Nose, Mouth, and Throat Lips, teeth, and gums WNL.Marland Kitchen Moist mucosa without lesions. Neck supple and nontender. No palpable supraclavicular or cervical adenopathy. Normal sized without goiter. Respiratory WNL. No retractions.. Cardiovascular Pedal Pulses WNL. No clubbing, cyanosis or edema. Lymphatic No adneopathy. No adenopathy. No adenopathy. Musculoskeletal Adexa without tenderness or enlargement.. Digits and nails w/o clubbing, cyanosis, infection, petechiae, ischemia, or inflammatory conditions.. Integumentary (Hair, Skin) No suspicious lesions. No crepitus or fluctuance. No peri-wound warmth or erythema. No masses.Marland Kitchen Psychiatric Judgement and insight Intact.. No evidence of depression, anxiety, or agitation.. Notes the wound is healed. Electronic Signature(s) Signed: 04/24/2017 4:11:00 PM By: Evlyn Kanner MD, FACS Entered By: Evlyn Kanner on 04/24/2017 16:10:59 [name], [name] ([id number]) -------------------------------------------------------------------------------- Physician Orders Details Patient Name: GESELLE, GIASSON. Date of Service: 04/24/2017 3:30 PM Medical Record Number: [id number] Patient Account Number: [id number] Date of Birth/Sex:  September 30, 1956 (61 y.o. Female) Treating RN: Huel Coventry Primary Care Provider: Noemi Chapel Other Clinician: Referring Provider: Noemi Chapel Treating Provider/Extender: Rudene Re in Treatment: 22 Verbal / Phone Orders: No Diagnosis Coding Edema Control Wound #1 Right,Anterior Lower  Leg o Patient to wear own Juxtalite/Juzo compression garment. Wound #3 Right,Medial Lower Leg o Patient to wear own Juxtalite/Juzo compression garment. Discharge From [facility] Services Wound #1 Right,Anterior Lower Leg o Discharge from Wound Care Center Wound #3 Right,Medial Lower Leg o Discharge from Wound Care Center Electronic Signature(s) Signed: 04/24/2017 4:13:28 PM By: Evlyn Kanner MD, FACS Signed: 04/24/2017 4:30:01 PM By: Elliot Gurney RN, BSN, Kim RN, BSN Entered By: Elliot Gurney, RN, BSN, Kim on 04/24/2017 16:01:17 [name], [name] ([id number]) -------------------------------------------------------------------------------- Problem List Details Patient Name: [name], [name]. Date of Service: 04/24/2017 3:30 PM Medical Record Number: [id number] Patient Account Number: [id number] Date of Birth/Sex: Jun 26, 1956 (61 y.o. 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 11/18/2016 Yes exposed I83.012 Varicose veins of right lower extremity with ulcer of calf 11/18/2016 Yes F17.218 Nicotine dependence, cigarettes, with other nicotine- 11/18/2016 Yes induced disorders M05.60 Rheumatoid arthritis of unspecified site with involvement 11/18/2016 Yes of other organs and systems Inactive Problems Resolved Problems Electronic Signature(s) Signed: 04/24/2017 4:10:13 PM By: Evlyn Kanner MD, FACS Entered By: Evlyn Kanner on  04/24/2017 16:10:13 [name], [name] ([id number]) -------------------------------------------------------------------------------- Progress Note Details Patient Name: Marilyn Mins. Date of Service: 04/24/2017 3:30 PM Medical Record Number: [id number] Patient Account Number: [id number] Date of Birth/Sex: 1956-06-07 (61 y.o. Female) Treating RN: Huel Coventry Primary Care Provider: Noemi Chapel Other Clinician: Referring Provider: Noemi Chapel Treating Provider/Extender: Rudene Re in Treatment: 22 Subjective Chief Complaint Information obtained from Patient Patient presents for treatment of an open ulcer due to venous insufficiency to her right lower extremity which she's had for over 6 months History of Present Illness (HPI) The following HPI elements were documented for the patient's wound: Location: ulcerated area on the right lower extremity Quality: Patient reports experiencing a sharp pain to affected area(s). Severity: Patient states wound are getting worse. Duration: Patient has had the wound for > 6 months prior to seeking treatment at the wound center Timing: Pain in wound is constant (hurts all the time) Context: The wound appeared gradually over time Modifying Factors: Other treatment(s) tried include:recent admission to hospital for a cellulitis and was treated with antibiotics and an Unna boot Associated Signs and Symptoms: Patient reports having increase swelling. 61 year old patient who was recently admitted to the hospital between 11/14/2016 and 11/15/2016 for right lower extremity ulcer with cellulitis. she was asked to stop clindamycin and doxycycline and her prednisone and take Levaquin 500 mg daily by mouth. she was treated for a right lower extremity possibly due to venous stasis versus vascular insufficiency versus infection. She received IV vancomycin during her hospital stay. He is known to have rheumatoid arthritis and stopped taking her treatment  because of lack of insurance. during her hospital stay vascular studies were done -- ABIs were 0.88 on the right and 1.1 on the left, she had triphasic flow on both lower extremities and a plain film of the right tibia and fibula showed no signs of osteomyelitis. She is not a diabetic. She is a smoker however and continued to smoke. lab work noted that her sedimentation rate was 29, CRP was less than 0.8, hemoglobin A1c was 5.2 12/02/2016 -- due to a family emergency and social economic reasons she has been unable to keep her appointment with the vascular department for her workup and is expecting  insurance coverage after January 1 12/19/2016 -- she continues to smoke. As have new insurance and hopefully will get her vascular test done as soon as possible. 12/26/2016 -- she has quit smoking for the last 3 days and I have commended her. She did receive her appointment from vein and vascular [place] and it is later this month 01/13/2017 -- she has not been back to see [place] since January 11 and had her compression wrap on all these days almost 3 weeks. I have sternly cautioned her regarding this. Her appointment for a venous [name], [name]. ([id number]) duplex study is next Thursday. 01/23/2017 ---- lower extremity venous duplex reflux evaluation shows small saphenous vein is competent but there is evidence of great saphenous vein reflux in the right lower extremity with no evidence of DVT or SVT. There is also deep vein reflux in the right lower extremity. A vascular consult was recommended. Note the left side was not studied. Lower extremity arterial evaluation was done -- unable to obtain right dorsalis pedis or posterior tibial pressures due to an open wound. Posterior tibial and dorsalis pedis waveform appears strong and triphasic. Right TBI was 0.88. Left ABI was 1.21. 01/30/2017 -- he has an appointment to see the vascular surgeons at [facility] this coming Thursday. her Apligraf has  been authorized by her [employer] and we will get this for her for next week. 2/22 2018 -- she was seen by Dr. Tawanna Cooler Early this morning -- she was found to have severe CEAP class VI venous stasis disease and had recommended laser ablation of her right great saphenous vein and stab phlebectomy's of multiple tributary varicosities to recent venous hypertension and improve her wound healing and reduce the risk of recurrent ulceration. She has had her first application of Apligraf today. 02/13/2017 -- her surgery has been scheduled for next Thursday and hence we will not see her to the Monday after. He will be prepared to apply an Apligraf on that day 02/24/2017 -- her surgery last Thursday was canceled and it has been scheduled for next Thursday. 03/06/2017 -- was seen today by Dr. Tawanna Cooler Early who reviewed a duplex which shows thrombosis of the great saphenous vein with no evidence of DVT -- He recommended she should continue with the wound clinic and would see her back on an as-needed basis. For various reasons the patient has not been here regularly, as been a month sincee her last application of Apligraf. Today she is had a second application of the Apligraf. 03/20/2017 -- she is here for her third application of Apligraf 04/02/2017 -- she was here for her fourth application of Apligraf but the wound has gotten much smaller and almost completely healed and hence we will not use any more Apligraf Objective Constitutional Pulse regular. Respirations normal and unlabored. Afebrile. Vitals Time Taken: 3:38 PM, Height: 63 in, Weight: 214.7 lbs, BMI: 38, Temperature: 98.4 F, Pulse: 88 bpm, Respiratory Rate: 16 breaths/min, Blood Pressure: 160/78 mmHg. Eyes Nonicteric. Reactive to light. [name], [name] ([id number]) Ears, Nose, Mouth, and Throat Lips, teeth, and gums WNL.Marland Kitchen Moist mucosa without lesions. Neck supple and nontender. No palpable supraclavicular or cervical adenopathy. Normal  sized without goiter. Respiratory WNL. No retractions.. Cardiovascular Pedal Pulses WNL. No clubbing, cyanosis or edema. Lymphatic No adneopathy. No adenopathy. No adenopathy. Musculoskeletal Adexa without tenderness or enlargement.. Digits and nails w/o clubbing, cyanosis, infection, petechiae, ischemia, or inflammatory conditions.Marland Kitchen Psychiatric Judgement and insight Intact.. No evidence of depression, anxiety, or agitation.. General Notes: the wound  is healed. Integumentary (Hair, Skin) No suspicious lesions. No crepitus or fluctuance. No peri-wound warmth or erythema. No masses.. Wound #1 status is Healed - Epithelialized. Original cause of wound was Gradually Appeared. The wound is located on the Right,Anterior Lower Leg. The wound measures 0cm length x 0cm width x 0cm depth; 0cm^2 area and 0cm^3 volume. Wound #3 status is Healed - Epithelialized. Original cause of wound was Gradually Appeared. The wound is located on the Right,Medial Lower Leg. The wound measures 0cm length x 0cm width x 0cm depth; 0cm^2 area and 0cm^3 volume. Assessment Active Problems ICD-10 L97.212 - Non-pressure chronic ulcer of right calf with fat layer exposed I83.012 - Varicose veins of right lower extremity with ulcer of calf F17.218 - Nicotine dependence, cigarettes, with other nicotine-induced disorders M05.60 - Rheumatoid arthritis of unspecified site with involvement of other organs and systems Kronick, Myana K. ([id number]) Plan Edema Control: Wound #1 Right,Anterior Lower Leg: Patient to wear own Juxtalite/Juzo compression garment. Wound #3 Right,Medial Lower Leg: Patient to wear own Juxtalite/Juzo compression garment. Discharge From [facility] Services: Wound #1 Right,Anterior Lower Leg: Discharge from Wound Care Center Wound #3 Right,Medial Lower Leg: Discharge from Wound Care Center The wound has completely healed and in anticipation of a healing via daughter her juxta lites which she has got  with her today. I have shown her how to protect the wound with a form placed over the recently healed supple scar. She will continue to use her juxta lites appropriately. She is discharged on the wound care services and be seen back only if needed Electronic Signature(s) Signed: 04/25/2017 11:54:40 AM By: Evlyn Kanner MD, FACS Previous Signature: 04/24/2017 4:11:51 PM Version By: Evlyn Kanner MD, FACS Entered By: Evlyn Kanner on 04/25/2017 11:54:40 Anzalone, [name] ([id number]) -------------------------------------------------------------------------------- SuperBill Details Patient Name: Marilyn Mins. Date of Service: 04/24/2017 Medical Record Number: [id number] Patient Account Number: [id number] Date of Birth/Sex: February 28, 1956 (61 y.o. Female) Treating RN: Huel Coventry Primary Care Provider: Noemi Chapel Other Clinician: Referring Provider: Noemi Chapel Treating Provider/Extender: Rudene Re in Treatment: 22 Diagnosis Coding ICD-10 Codes Code Description [phone] Non-pressure chronic ulcer of right calf with fat layer exposed I83.012 Varicose veins of right lower extremity with ulcer of calf F17.218 Nicotine dependence, cigarettes, with other nicotine-induced disorders M05.60 Rheumatoid arthritis of unspecified site with involvement of other organs and systems Facility Procedures CPT4 Code: [id number] Description: [phone] - WOUND CARE VISIT-LEV 2 EST PT Modifier: Quantity: 1 Physician Procedures CPT4: Description Modifier Quantity Code [id number] - WC PHYS LEVEL 2 - EST PT 1 ICD-10 Description Diagnosis 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 layer exposed I83.012 Varicose veins of right lower extremity with ulcer of calf  F17.218 Nicotine dependence, cigarettes, with other nicotine-induced disorders M05.60 Rheumatoid arthritis of unspecified site with involvement of other organs and systems Electronic Signature(s) Signed: 04/25/2017 3:55:17 PM By:  Evlyn Kanner MD, FACS Signed: 04/25/2017 4:30:32 PM By: Elliot Gurney RN, BSN, Kim RN, BSN Previous Signature: 04/24/2017 4:12:08 PM Version By: Evlyn Kanner MD, FACS Entered By: Elliot Gurney RN, BSN, Kim on 04/24/2017 16:47:21

## 2017-04-29 ENCOUNTER — Other Ambulatory Visit: Payer: Self-pay | Admitting: Internal Medicine

## 2017-04-29 ENCOUNTER — Encounter: Payer: Self-pay | Admitting: Internal Medicine

## 2017-04-30 ENCOUNTER — Other Ambulatory Visit: Payer: Self-pay | Admitting: *Deleted

## 2017-04-30 MED ORDER — MELOXICAM 7.5 MG PO TABS: 15.0000 mg | ORAL_TABLET | Freq: Every day | ORAL | 0 refills | Status: DC

## 2017-05-01 ENCOUNTER — Ambulatory Visit: Payer: BLUE CROSS/BLUE SHIELD | Admitting: Surgery

## 2017-05-06 ENCOUNTER — Ambulatory Visit (INDEPENDENT_AMBULATORY_CARE_PROVIDER_SITE_OTHER): Payer: BLUE CROSS/BLUE SHIELD | Admitting: Internal Medicine

## 2017-05-06 ENCOUNTER — Encounter: Payer: Self-pay | Admitting: Internal Medicine

## 2017-05-06 ENCOUNTER — Encounter: Payer: BLUE CROSS/BLUE SHIELD | Admitting: Internal Medicine

## 2017-05-06 VITALS — BP 157/78 | HR 87 | Temp 98.3°F | Wt 241.3 lb

## 2017-05-06 DIAGNOSIS — B351 Tinea unguium: Secondary | ICD-10-CM

## 2017-05-06 DIAGNOSIS — Z0000 Encounter for general adult medical examination without abnormal findings: Secondary | ICD-10-CM

## 2017-05-06 DIAGNOSIS — M069 Rheumatoid arthritis, unspecified: Secondary | ICD-10-CM

## 2017-05-06 DIAGNOSIS — F17210 Nicotine dependence, cigarettes, uncomplicated: Secondary | ICD-10-CM

## 2017-05-06 DIAGNOSIS — E038 Other specified hypothyroidism: Secondary | ICD-10-CM

## 2017-05-06 DIAGNOSIS — M170 Bilateral primary osteoarthritis of knee: Secondary | ICD-10-CM | POA: Diagnosis not present

## 2017-05-06 DIAGNOSIS — I10 Essential (primary) hypertension: Secondary | ICD-10-CM

## 2017-05-06 DIAGNOSIS — E039 Hypothyroidism, unspecified: Secondary | ICD-10-CM

## 2017-05-06 DIAGNOSIS — E063 Autoimmune thyroiditis: Principal | ICD-10-CM

## 2017-05-06 DIAGNOSIS — Z1211 Encounter for screening for malignant neoplasm of colon: Secondary | ICD-10-CM

## 2017-05-06 MED ORDER — LISINOPRIL 10 MG PO TABS: 5.0000 mg | ORAL_TABLET | Freq: Every day | ORAL | 3 refills | Status: DC

## 2017-05-06 MED ORDER — TRAMADOL HCL 50 MG PO TABS: 50.0000 mg | ORAL_TABLET | Freq: Four times a day (QID) | ORAL | 0 refills | Status: DC | PRN

## 2017-05-06 NOTE — Progress Notes (Signed)
   CC: follow-up for knee pain, HTN and hypothyroidism  HPI:  [name] is a very pleasant 61 y.o. female who presents to clinic today for follow-up of her chronic medical conditions as outlined below.  Severe tricompartmental disease BL: As demonstrated on several imaging studies. She's received steroid injections and tried a two-month course of meloxicam without any improvement of her symptoms. Just reports trying topical Voltaren gel as well without any improvement. She's been referred to pain clinic and her appointment is next week on the 30th and she requests a short course of tramadol to tide her over until then.   Rheumatoid arthritis: Was able to go to her first rheumatology appointment this week after several years without taking them. They increased her methotrexate and start her on prednisone as well. She notes they are also referring her to physical therapy as well and obtaining a DEXA scan. She follows up with him in another 3 months.  HTN: Had been on Lisinopril before with good result. Open to restarting today.  Hypothyroidism: Synthroid was recently increased to 200 g and patient feels well and appreciates a difference. Feels good on this dose. No anxiety, tremor or weight loss.  Onychomycosis: She requests referral to podiatry for refractory onychomycosis.  Past Medical History:  Diagnosis Date  . Hashimoto's thyroiditis   . HTN (hypertension)   . Hypercholesteremia   . Hypothyroid   . Rheumatoid arthritis(714.0)     Review of Systems:  Review of Systems  Constitutional: Negative for chills, fever and weight loss.  Respiratory: Negative for cough.   Cardiovascular: Negative for chest pain, palpitations and leg swelling.  Gastrointestinal: Negative for abdominal pain, nausea and vomiting.  Musculoskeletal: Positive for back pain, joint pain and myalgias. Negative for falls.  Neurological: Negative for tingling and headaches.   Physical Exam: Physical Exam    Constitutional: She appears well-developed and well-nourished. No distress.  HENT:  Head: Normocephalic and atraumatic.  Cardiovascular: Normal rate, regular rhythm, normal heart sounds and intact distal pulses.   Pulmonary/Chest: Effort normal and breath sounds normal. No respiratory distress. She has no wheezes.  Abdominal: Soft. Bowel sounds are normal. She exhibits no distension. There is no tenderness.  Musculoskeletal: She exhibits deformity.  Ulnar deviation BL hands. Boutonnieres deformities   Skin: Skin is warm and dry. She is not diaphoretic.  BL toenails thick, flaky and discolored consistent with fungal infection   Vitals:   05/06/17 1501  BP: (!) 157/78  Pulse: 87  Temp: 98.3 F (36.8 C)  TempSrc: Oral  SpO2: 93%  Weight: 241 lb 4.8 oz (109.5 kg)    Assessment & Plan:   See Encounters Tab for problem based charting.  Patient discussed with Dr. Josem Kaufmann

## 2017-05-06 NOTE — Patient Instructions (Signed)
It was a pleasure seeing you today!  I'm sorry you are having significant knee pain. I'm glad you will be seeing the pain clinic next week. Please keep that appointment. I am giving you a 1 time prescription for Ultram to last until your appointment.   Your blood pressure has been consistently high. I've started you on a medication called Lisinopril which will help lower it. If you notice any facial swelling, please discontinue the medicine and go to the emergency department.    I have also ordered for you to have a mammogram, colonoscopy as well as a podiatry referral.   Please come back in 1 month for follow-up of your blood pressure!

## 2017-05-07 DIAGNOSIS — Z0000 Encounter for general adult medical examination without abnormal findings: Secondary | ICD-10-CM | POA: Insufficient documentation

## 2017-05-07 DIAGNOSIS — B351 Tinea unguium: Secondary | ICD-10-CM | POA: Insufficient documentation

## 2017-05-07 NOTE — Progress Notes (Signed)
Case discussed with Dr. Molt at the time of the visit.  We reviewed the resident's history and exam and pertinent patient test results.  I agree with the assessment, diagnosis and plan of care documented in the resident's note. 

## 2017-05-07 NOTE — Assessment & Plan Note (Signed)
Patient with onychomycosis that's not responding to OTC treatments. She requests podiatry referral.  -Refer to podiatry

## 2017-05-07 NOTE — Assessment & Plan Note (Signed)
Patient hypertensive today at 157/78 and has been intermittently hypertensive throughout her visits here in the clinic. Endorses hx of hypertension which had previously been diet controlled. Open to starting oral agent today.  -Lisinopril 10 mg Q daily -FU in clinic in 1 month for FU and BMET for renal fxn

## 2017-05-07 NOTE — Assessment & Plan Note (Signed)
Has been referred to pain clinic with first appointment 5/30. Patient looking forward to seeing pain specialist. Reports no improvement with steroid injections, PO and topical NSAIDs. She requests short course of Ultram to tide her over until her pain clinic visit.  Good conversation was had about this. She was given a one time week supply of tramadol with the understanding that all further pain regimen will be per her pain clinic.

## 2017-05-07 NOTE — Assessment & Plan Note (Signed)
Have referred patient for mammogram and colonoscopy.

## 2017-05-07 NOTE — Assessment & Plan Note (Signed)
Feeling well on increased dose of Synthroid 200 mcg daily and notices increased energy. No complaints of anxiety, tremor, sweating, diarrhea or weight loss. Will continue current synthroid dose and encouraged strict compliance.

## 2017-05-08 ENCOUNTER — Encounter: Payer: Self-pay | Admitting: Internal Medicine

## 2017-05-15 ENCOUNTER — Other Ambulatory Visit: Payer: Self-pay | Admitting: Surgery

## 2017-05-15 ENCOUNTER — Encounter: Payer: Self-pay | Admitting: Internal Medicine

## 2017-05-15 ENCOUNTER — Other Ambulatory Visit: Payer: Self-pay | Admitting: Internal Medicine

## 2017-05-15 DIAGNOSIS — K430 Incisional hernia with obstruction, without gangrene: Secondary | ICD-10-CM

## 2017-05-15 DIAGNOSIS — Z1231 Encounter for screening mammogram for malignant neoplasm of breast: Secondary | ICD-10-CM

## 2017-05-22 ENCOUNTER — Ambulatory Visit
Admission: RE | Admit: 2017-05-22 | Discharge: 2017-05-22 | Disposition: A | Discharge status: Home / Self Care | Payer: BLUE CROSS/BLUE SHIELD | Source: Ambulatory Visit | Attending: Surgery | Admitting: Surgery

## 2017-05-22 DIAGNOSIS — K430 Incisional hernia with obstruction, without gangrene: Secondary | ICD-10-CM

## 2017-05-22 MED ORDER — IOPAMIDOL (ISOVUE-300) INJECTION 61%: 100.0000 mL | Freq: Once | INTRAVENOUS | Status: DC | PRN

## 2017-05-22 MED ORDER — IOPAMIDOL (ISOVUE-300) INJECTION 61%
125.0000 mL | Freq: Once | INTRAVENOUS | Status: AC | PRN
  Administered 2017-05-22: 125 mL via INTRAVENOUS

## 2017-05-26 ENCOUNTER — Other Ambulatory Visit: Payer: Self-pay | Admitting: Internal Medicine

## 2017-05-27 ENCOUNTER — Other Ambulatory Visit: Payer: Self-pay | Admitting: *Deleted

## 2017-06-03 ENCOUNTER — Encounter: Payer: Self-pay | Admitting: Podiatry

## 2017-06-03 ENCOUNTER — Ambulatory Visit (INDEPENDENT_AMBULATORY_CARE_PROVIDER_SITE_OTHER): Payer: BLUE CROSS/BLUE SHIELD | Admitting: Podiatry

## 2017-06-03 VITALS — BP 170/94 | HR 89

## 2017-06-03 DIAGNOSIS — B351 Tinea unguium: Secondary | ICD-10-CM

## 2017-06-03 DIAGNOSIS — M79676 Pain in unspecified toe(s): Secondary | ICD-10-CM | POA: Diagnosis not present

## 2017-06-03 DIAGNOSIS — Q828 Other specified congenital malformations of skin: Secondary | ICD-10-CM

## 2017-06-03 NOTE — Progress Notes (Signed)
   Subjective:    Patient ID: [name], female    DOB: 1956-04-17, 61 y.o.   MRN: [id number]  HPI this patient presents the office with chief complaint of painful long thick nails.  Patient states that she is been unable to trim the nails herself and they are especially long and painful walking and wearing her shoes. She does say that she had heel surgery years ago and has developed a callus on the outside of her left heel.  This calluses painful walking and wearing her shoes.  This pain is intermittent in nature.  She presents the office today for preventative foot care services. No history of diabetes is noted    Review of Systems  All other systems reviewed and are negative.      Objective:   Physical Exam GENERAL APPEARANCE: Alert, conversant. Appropriately groomed. No acute distress.  VASCULAR: Pedal pulses are  palpable at  [facility] and PT bilateral.  Capillary refill time is immediate to all digits,  Normal temperature gradient.  Digital hair growth is present bilateral  NEUROLOGIC: sensation is normal to 5.07 monofilament at 5/5 sites bilateral.  Light touch is intact bilateral, Muscle strength normal.  MUSCULOSKELETAL: acceptable muscle strength, tone and stability bilateral.  Intrinsic muscluature intact bilateral.  Rectus appearance of foot and digits noted bilateral.  NAILS   thick disfigured discolored nails with subungual debris noted with especially thick and painful great toenails, both feet. No evidence of any bacterial infection or drainage DERMATOLOGIC: skin color, texture, and turgor are within normal limits.  No preulcerative lesions or ulcers  are seen, no interdigital maceration noted.  No open lesions present.  . No drainage noted. Porokeratosis noted on the lateral aspect of the left heel         Assessment & Plan:  Onychomycosis  B/l.  Porokeratosis  Left heel.   Initial exam [place] of long thick painful nails.  Debridement of porokeratosis left heel.   Return to clinic 3 months for preventative foot care services   Helane Gunther DPM

## 2017-06-10 NOTE — Addendum Note (Signed)
Addended by: Neomia Dear on: 06/10/2017 07:46 AM   Modules accepted: Orders

## 2017-06-12 ENCOUNTER — Telehealth: Payer: Self-pay | Admitting: Physician Assistant

## 2017-06-12 NOTE — Telephone Encounter (Signed)
Received records from [facility] Surgery for appointment on 06/17/17 with Theodore Demark, PA.  Records put with Rhonda's schedule for 06/17/17. lp

## 2017-06-17 ENCOUNTER — Encounter: Payer: Self-pay | Admitting: Physician Assistant

## 2017-06-17 ENCOUNTER — Ambulatory Visit (INDEPENDENT_AMBULATORY_CARE_PROVIDER_SITE_OTHER): Payer: BLUE CROSS/BLUE SHIELD | Admitting: Physician Assistant

## 2017-06-17 VITALS — BP 130/78 | HR 78 | Ht 62.0 in | Wt 244.4 lb

## 2017-06-17 DIAGNOSIS — R011 Cardiac murmur, unspecified: Secondary | ICD-10-CM | POA: Diagnosis not present

## 2017-06-17 DIAGNOSIS — Z01818 Encounter for other preprocedural examination: Secondary | ICD-10-CM | POA: Diagnosis not present

## 2017-06-17 MED ORDER — FUROSEMIDE 20 MG PO TABS: 20.0000 mg | ORAL_TABLET | Freq: Every day | ORAL | 3 refills | Status: DC

## 2017-06-17 MED ORDER — POTASSIUM CHLORIDE ER 10 MEQ PO TBCR: 10.0000 meq | EXTENDED_RELEASE_TABLET | Freq: Every day | ORAL | 3 refills | Status: DC

## 2017-06-17 NOTE — Progress Notes (Signed)
Cardiology Office Note   Date:  06/17/2017   ID:  [name], DOB [birth year], MRN [id number]  PCP:  Noemi Chapel, DO  Cardiologist:  New, Dr [place]  Barrett, Rhonda, PA-C   CC: preop evaluation  History of Present Illness: [name] is a 61 y.o. female with a history of varicose veins with ablation, HTN, HLD, Hashimoto's thyroiditis with subsequent hypothyroidism, RA, SEM  [name] presents for cardiology evaluation prior to laparoscopic hernia repair. She was referred because of her heart murmur.  She is active in general. Her RA gives her trouble, especially in her knees. The bedrooms are upstairs and she climbs the stairs every day. She does not stop, but cannot go very fast because of her knees. She is not aware of any SOB at the top, her knees just hurt. She will have some DOE when outside in hot/humid weather.   She has rare episodes of chest pain. It is an ache that will last a few seconds, not exertional. Nothing that brings it on. Less than 5 episodes total. She gets it 1-2 x year. Hasn't lasted long enough to take meds or seek help.   She has some LE edema that occurs during the day. Worse on the R side, but her R knee has more problems also. No sig DOE. No orthopnea, no PND.   No palpitations, no hx presyncope or syncope. She gets a little light-headed by lunch if she does not eat breakfast, can prevent this by eating breakfast.  Her legs are more swollen than usual today. She was up on her feet all day yesterday cleaning and had country ham for supper last night. She admits that she is not that carefull about the sodium in her diet.   Past Medical History:  Diagnosis Date  . Hashimoto's thyroiditis   . HTN (hypertension)   . Hypercholesteremia   . Hypothyroid   . Rheumatoid arthritis(714.0)     Past Surgical History:  Procedure Laterality Date  . CHOLECYSTECTOMY    . ELBOW SURGERY    . EXPLORATORY LAPAROTOMY    . HAND SURGERY    . HEEL  SPUR SURGERY    . partial thyroidectomy  2015  . TONSILLECTOMY    . TUBAL LIGATION      Current Outpatient Prescriptions  Medication Sig Dispense Refill  . Cholecalciferol (VITAMIN D-1000 MAX ST) 1000 units tablet Take 1 capsule by mouth daily.    . folic acid (FOLVITE) 1 MG tablet Take 1 tablet by mouth daily.  0  . levothyroxine (SYNTHROID) 200 MCG tablet Take 1 tablet (200 mcg total) by mouth daily. 30 tablet 3  . lisinopril (PRINIVIL,ZESTRIL) 10 MG tablet Take 0.5 tablets (5 mg total) by mouth daily. 30 tablet 3  . meloxicam (MOBIC) 7.5 MG tablet Take 2 tablets (15 mg total) by mouth daily. 60 tablet 0  . methotrexate (RHEUMATREX) 2.5 MG tablet Take 3 tablets (7.5 mg total) by mouth every 7 (seven) days. NO CHILD-PROOF BOTTLE CAPS PLEASE 36 tablet 1  . pantoprazole (PROTONIX) 40 MG tablet take 2 tablet by mouth once daily 180 tablet 2  . simvastatin (ZOCOR) 20 MG tablet take 1 tablet by mouth once daily AT 6PM 30 tablet 1  . furosemide (LASIX) 20 MG tablet Take 1 tablet (20 mg total) by mouth daily. 90 tablet 3  . potassium chloride (K-DUR) 10 MEQ tablet Take 1 tablet (10 mEq total) by mouth daily. 90 tablet 3   No  current facility-administered medications for this visit.     Allergies:   Latex; Oxycodone-acetaminophen; Tyloxapol; Adhesive [tape]; Penicillins; and Strawberry extract    Social History:  The patient  reports that she has been smoking Cigarettes.  She has a 10.00 pack-year smoking history. She has never used smokeless tobacco. She reports that she does not drink alcohol or use drugs.   Family History:  The patient's family history includes Alzheimer's disease in her maternal grandmother; Diabetes in her mother; Heart disease in her paternal grandmother; Kidney disease in her mother; Lung cancer in her father; Stroke in her maternal grandfather; Unexplained death in her paternal grandfather.    ROS:  Please see the history of present illness. All other systems are  reviewed and negative.    PHYSICAL EXAM: VS:  BP 130/78 (BP Location: Right Arm, Cuff Size: Large)   Pulse 78   Ht 5\' 2"  (1.575 m)   Wt 244 lb 6.4 oz (110.9 kg)   BMI 44.70 kg/m  , BMI Body mass index is 44.7 kg/m. GEN: Well nourished, well developed, female in no acute distress  HEENT: normal for age  Neck: no JVD, +L carotid bruit (radiation of murmur), no masses Cardiac: RRR; 2/6 murmur, no rubs, or gallops Respiratory: few rales bases bilaterally, normal work of breathing GI: soft, nontender, nondistended, + BS MS: no deformity or atrophy; 2-3+ edema; distal pulses are 2+ in all 4 extremities   Skin: warm and dry, no rash;  Neuro:  Strength and sensation are intact Psych: euthymic mood, full affect   EKG:  EKG is ordered today. The ekg ordered today demonstrates sinus rhythm, heart rate 78, premature junctional beats seen, possible RV conduction delay. No old is available   Recent Labs: 02/18/2017: ALT 8; BUN 18; Creatinine, Ser 0.69; Hemoglobin 12.2; Platelets 184; Potassium 4.7; Sodium 144; TSH 6.120    Lipid Panel    Component Value Date/Time   CHOL 224 (H) 11/15/2016 0543   TRIG 157 (H) 11/15/2016 0543   HDL 51 11/15/2016 0543   CHOLHDL 4.4 11/15/2016 0543   VLDL 31 11/15/2016 0543   LDLCALC 142 (H) 11/15/2016 0543     Wt Readings from Last 3 Encounters:  06/17/17 244 lb 6.4 oz (110.9 kg)  05/06/17 241 lb 4.8 oz (109.5 kg)  03/12/17 241 lb 11.2 oz (109.6 kg)     Other studies Reviewed: Additional studies/ records that were reviewed today include:  [facility] 03/14/17 Surgery note, [facility] notes by Dr. Washington.  ASSESSMENT AND PLAN: The patient and the plan were reviewed with Dr Vincente Liberty, who agrees.  1.  Preoperative evaluation: She is not having any exertional symptoms. She does 4 metastases on a regular basis because of the stairs that she climbs. From an ischemic standpoint, no further evaluation needs to be done. However, because of her murmur, an  echocardiogram should be obtained prior to surgery to make sure that her heart is structurally normal.  2. SEM: The murmur is loudest at the left upper sternal border. She states she has had it for years, cannot remember when she was first told about it. She is asymptomatic. We will check an echocardiogram.   Current medicines are reviewed at length with the patient today.  The patient does not have concerns regarding medicines.  The following changes have been made:  no change  Labs/ tests ordered today include:   Orders Placed This Encounter  Procedures  . EKG 12-Lead  . ECHOCARDIOGRAM COMPLETE  Disposition:   FU with Dr. [place]  Signed, Theodore Demark, PA-C  06/17/2017 5:47 PM    [facility] Phone: [phone]; Fax: [phone]  This note was written with the assistance of speech recognition software. Please excuse any transcriptional errors.

## 2017-06-17 NOTE — Patient Instructions (Signed)
Medication Instructions:  START FUROSEMIDE 20MG  FOR 3DAYS THEN AS NEEDED FOR LOWER EXTREMITY SWELLING START POTASSIUM WITH YOUR LASIX  If you need a refill on your cardiac medications before your next appointment, please call your pharmacy.  Testing/Procedures: Your physician has requested that you have an echocardiogram. Echocardiography is a painless test that uses sound waves to create images of your heart. It provides your doctor with information about the size and shape of your heart and how well your heart's chambers and valves are working. This procedure takes approximately one hour. There are no restrictions for this procedure.  Follow-Up: Your physician wants you to follow-up in: 3 MONTHS WITH DR .   Thank you for choosing [facility] at [facility]!!

## 2017-06-24 ENCOUNTER — Encounter: Payer: Self-pay | Admitting: Internal Medicine

## 2017-06-24 ENCOUNTER — Ambulatory Visit (INDEPENDENT_AMBULATORY_CARE_PROVIDER_SITE_OTHER): Payer: BLUE CROSS/BLUE SHIELD | Admitting: Internal Medicine

## 2017-06-24 VITALS — BP 138/74 | HR 78 | Temp 98.0°F | Ht 62.0 in | Wt 241.8 lb

## 2017-06-24 DIAGNOSIS — M545 Low back pain, unspecified: Secondary | ICD-10-CM | POA: Insufficient documentation

## 2017-06-24 DIAGNOSIS — Z8249 Family history of ischemic heart disease and other diseases of the circulatory system: Secondary | ICD-10-CM

## 2017-06-24 DIAGNOSIS — E038 Other specified hypothyroidism: Secondary | ICD-10-CM

## 2017-06-24 DIAGNOSIS — F17210 Nicotine dependence, cigarettes, uncomplicated: Secondary | ICD-10-CM

## 2017-06-24 DIAGNOSIS — I10 Essential (primary) hypertension: Secondary | ICD-10-CM

## 2017-06-24 DIAGNOSIS — G8929 Other chronic pain: Secondary | ICD-10-CM | POA: Diagnosis not present

## 2017-06-24 DIAGNOSIS — Z79891 Long term (current) use of opiate analgesic: Secondary | ICD-10-CM | POA: Diagnosis not present

## 2017-06-24 DIAGNOSIS — E063 Autoimmune thyroiditis: Secondary | ICD-10-CM

## 2017-06-24 DIAGNOSIS — M170 Bilateral primary osteoarthritis of knee: Secondary | ICD-10-CM

## 2017-06-24 DIAGNOSIS — E039 Hypothyroidism, unspecified: Secondary | ICD-10-CM | POA: Diagnosis not present

## 2017-06-24 DIAGNOSIS — Z79899 Other long term (current) drug therapy: Secondary | ICD-10-CM

## 2017-06-24 MED ORDER — LEVOTHYROXINE SODIUM 200 MCG PO TABS: 200.0000 ug | ORAL_TABLET | Freq: Every day | ORAL | 3 refills | Status: DC

## 2017-06-24 MED ORDER — SIMVASTATIN 20 MG PO TABS: ORAL_TABLET | ORAL | 1 refills | Status: DC

## 2017-06-24 MED ORDER — CYCLOBENZAPRINE HCL 10 MG PO TABS: 10.0000 mg | ORAL_TABLET | Freq: Three times a day (TID) | ORAL | 0 refills | Status: DC | PRN

## 2017-06-24 MED ORDER — HYDROCODONE-ACETAMINOPHEN 5-300 MG PO TABS: 1.0000 | ORAL_TABLET | Freq: Four times a day (QID) | ORAL | 0 refills | Status: DC | PRN

## 2017-06-24 NOTE — Patient Instructions (Signed)
It was great seeing you today. I'm sorry you are in so much pain from your back! You likely have a vertebrae out of place from sleeping in a strange position. Your back muscles are quite spastic as well. For this, I am prescribing you a muscle relaxer called Flexeril to be taken 3 times daily as needed for spasms. This can make you drowsy.  I have also included a short course of Hydrocodone as well to help with both this acute back pain and your chronic knee pain until you can see your pain clinic next month.

## 2017-06-24 NOTE — Assessment & Plan Note (Signed)
Feeling well on current dose. Refill given for synthroid 200 mcg daily

## 2017-06-24 NOTE — Assessment & Plan Note (Addendum)
BP controlled today on Lisinopril 10 mg. This was started in May. Will continue current therapy. Refused BMET today however would benefit from evaluating renal fxn in setting of ACE-I addition. Of note, cardiology recently started her on lasix and potassium suppl.  -BMET at next clinic visit -Continue lisinopril 10

## 2017-06-24 NOTE — Progress Notes (Signed)
   CC: evaluation of acute low back pain and worsening chronic knee pains  HPI:  [name] is a 61 y.o. F who presents for evaluation of acute back pain and chronic BL LE pain secondary to Rheumatoid arthritis. Patient reports she woke up on Thursday, 06/19/17, with LBP. Dull at rest and sharp/severe with movement. Maybe a little better since onset. Never had anything like this before and she denies any weakness, tingling, sensory loss, urine or fecal incontinence, weight loss, fevers or night sweats. She has been "eating ibuprofen like candy" since then. No relief with tylenol or ultram. She also feels worsening of her chronic BL knee pain during this time as well which is largely unrelieved with ultram.   Past Medical History:  Diagnosis Date  . Hashimoto's thyroiditis   . HTN (hypertension)   . Hypercholesteremia   . Hypothyroid   . Rheumatoid arthritis(714.0)    Review of Systems:  As described in HPI, otherwise negative.   Physical Exam:  General: Alert, in no acute distress. Conversant but uncomfortable with movement.  HEENT: No icterus, injection or ptosis. No hoarseness or dysarthria  Cardiac: RRR. 2/6 systolic murmur. Pulmonary: Normal WOB on RA. Able to speak in complete sentences Abd: Soft, non-tended. +bs Extremities: Warm, perfused. No significant pedal edema. Strength and sensation are intact BL. Able to ambulate. ROM testing limited due to pain. She has TTP of BL SI joints, L>R. Left paraspinal muscles with TART changes. Hands with ulnar deviation.   Vitals:   06/24/17 1408  BP: 138/74  Pulse: 78  Temp: 98 F (36.7 C)  TempSrc: Oral  SpO2: 97%  Weight: 241 lb 12.8 oz (109.7 kg)  Height: 5\' 2"  (1.575 m)    Assessment & Plan:   See Encounters Tab for problem based charting.  Patient discussed with Dr. 

## 2017-06-24 NOTE — Assessment & Plan Note (Signed)
Notes worsening of her chronic BL knee pain since onset of back pain. No relief with tylenol and minimal relief with tramadol. She has been referred to pain clinic however unable to see patient until September. Offered Rx for tramadol however pt reluctant as she hasn't had much relief with this lately. She notes Vicodin has helped her knee pain in the past and requests a trial of this until able to see the pain clinic.  -Explained to patient my reluctance to prescribe anything stronger than tramadol however in setting of acute worsening of her chronic pain, failure to respond to current treatment and delay of her pain clinic referral, I will prescribe a short course of Vicodin

## 2017-06-24 NOTE — Assessment & Plan Note (Addendum)
No red flags in history or on examination. Palpable TART changes on examination as evidenced by tenderness, warmth, tissue texture changes and muscle spasms of left lower paraspinals. Considered OMT however patient too tender to tolerate much manipulation at this point. She notes "eating ibuprofen like candy".  -Conservative therapy with heat, movement as able and anti-inflammatories. Counseled on appropriate doses.  -Flexeril PRN -Vicodin PRN as described above.

## 2017-06-26 ENCOUNTER — Other Ambulatory Visit: Payer: Self-pay

## 2017-06-26 ENCOUNTER — Ambulatory Visit (HOSPITAL_COMMUNITY): Payer: BLUE CROSS/BLUE SHIELD | Attending: Cardiology

## 2017-06-26 DIAGNOSIS — Z720 Tobacco use: Secondary | ICD-10-CM | POA: Diagnosis not present

## 2017-06-26 DIAGNOSIS — E785 Hyperlipidemia, unspecified: Secondary | ICD-10-CM | POA: Diagnosis not present

## 2017-06-26 DIAGNOSIS — E669 Obesity, unspecified: Secondary | ICD-10-CM | POA: Diagnosis not present

## 2017-06-26 DIAGNOSIS — I081 Rheumatic disorders of both mitral and tricuspid valves: Secondary | ICD-10-CM | POA: Diagnosis not present

## 2017-06-26 DIAGNOSIS — Z01818 Encounter for other preprocedural examination: Secondary | ICD-10-CM

## 2017-06-26 DIAGNOSIS — R011 Cardiac murmur, unspecified: Secondary | ICD-10-CM | POA: Diagnosis not present

## 2017-06-26 DIAGNOSIS — I10 Essential (primary) hypertension: Secondary | ICD-10-CM | POA: Diagnosis not present

## 2017-06-26 NOTE — Progress Notes (Signed)
Internal Medicine Clinic Attending  Case discussed with Dr. Molt at the time of the visit.  We reviewed the resident's history and exam and pertinent patient test results.  I agree with the assessment, diagnosis, and plan of care documented in the resident's note. 

## 2017-06-27 ENCOUNTER — Encounter: Payer: Self-pay | Admitting: Internal Medicine

## 2017-07-02 ENCOUNTER — Encounter: Payer: Self-pay | Admitting: *Deleted

## 2017-07-04 ENCOUNTER — Ambulatory Visit: Payer: Self-pay | Admitting: Surgery

## 2017-07-30 NOTE — Pre-Procedure Instructions (Signed)
[name]  07/30/2017      [facility] - [place], [place] - [address] [place] [address] Phone: [phone] Fax: [phone]  [facility] [address] - [place], [place] - [address] [place] [address] Phone: [phone] Fax: [phone]    Your procedure is scheduled on August 20  Report to [facility] Admitting at 1030 A.M.  Call this number if you have problems the morning of surgery:  [phone]   Remember:  Do not eat food or drink liquids after midnight.  Continue all other medications as directed by your physician except follow these instructions about you medications    Take these medicines the morning of surgery with A SIP OF WATER cyclobenzaprine (FLEXERIL),  Hydrocodone-Acetaminophen (VICODIN), levothyroxine (SYNTHROID, pantoprazole (PROTONIX) ,   7 days prior to surgery STOP taking any meloxicam (MOBIC),  Aspirin, Aleve, Naproxen, Ibuprofen, Motrin, Advil, Goody's, BC's, all herbal medications, fish oil, and all vitamins    Do not wear jewelry, make-up or nail polish.  Do not wear lotions, powders, or perfumes, or deoderant.  Do not shave 48 hours prior to surgery.  Men may shave face and neck.  Do not bring valuables to the hospital.  [facility] is not responsible for any belongings or valuables.  Contacts, dentures or bridgework may not be worn into surgery.  Leave your suitcase in the car.  After surgery it may be brought to your room.  For patients admitted to the hospital, discharge time will be determined by your treatment team.  Patients discharged the day of surgery will not be allowed to drive home.    Special instructions:   [place]- Preparing For Surgery  Before surgery, you can play an important role. Because skin is not sterile, your skin needs to be as free of germs as possible. You can reduce the number of germs on your  skin by washing with CHG (chlorahexidine gluconate) Soap before surgery.  CHG is an antiseptic cleaner which kills germs and bonds with the skin to continue killing germs even after washing.  Please do not use if you have an allergy to CHG or antibacterial soaps. If your skin becomes reddened/irritated stop using the CHG.  Do not shave (including legs and underarms) for at least 48 hours prior to first CHG shower. It is OK to shave your face.  Please follow these instructions carefully.   1. Shower the NIGHT BEFORE SURGERY and the MORNING OF SURGERY with CHG.   2. If you chose to wash your hair, wash your hair first as usual with your normal shampoo.  3. After you shampoo, rinse your hair and body thoroughly to remove the shampoo.  4. Use CHG as you would any other liquid soap. You can apply CHG directly to the skin and wash gently with a scrungie or a clean washcloth.   5. Apply the CHG Soap to your body ONLY FROM THE NECK DOWN.  Do not use on open wounds or open sores. Avoid contact with your eyes, ears, mouth and genitals (private parts). Wash genitals (private parts) with your normal soap.  6. Wash thoroughly, paying special attention to the area where your surgery will be performed.  7. Thoroughly rinse your body with warm water from the neck down.  8. DO NOT shower/wash with your normal soap after using and rinsing off the CHG Soap.  9. Pat yourself dry with a CLEAN TOWEL.  10. Wear CLEAN PAJAMAS   11. Place CLEAN SHEETS on your bed the night of your first shower and DO NOT SLEEP WITH PETS.    Day of Surgery: Do not apply any deodorants/lotions. Please wear clean clothes to the hospital/surgery center.      Please read over the following fact sheets that you were given.

## 2017-07-31 ENCOUNTER — Encounter (HOSPITAL_COMMUNITY)
Admission: RE | Admit: 2017-07-31 | Discharge: 2017-07-31 | Disposition: A | Discharge status: Home / Self Care | Payer: BLUE CROSS/BLUE SHIELD | Source: Ambulatory Visit | Attending: Surgery | Admitting: Surgery

## 2017-07-31 ENCOUNTER — Encounter (HOSPITAL_COMMUNITY): Payer: Self-pay

## 2017-07-31 DIAGNOSIS — K432 Incisional hernia without obstruction or gangrene: Secondary | ICD-10-CM | POA: Diagnosis not present

## 2017-07-31 DIAGNOSIS — Z01818 Encounter for other preprocedural examination: Secondary | ICD-10-CM | POA: Insufficient documentation

## 2017-07-31 HISTORY — DX: Hereditary factor IX deficiency: D67

## 2017-07-31 HISTORY — DX: Asymptomatic varicose veins of unspecified lower extremity: I83.90

## 2017-07-31 HISTORY — DX: Gastro-esophageal reflux disease without esophagitis: K21.9

## 2017-07-31 HISTORY — DX: Emphysema, unspecified: J43.9

## 2017-07-31 HISTORY — DX: Cardiac murmur, unspecified: R01.1

## 2017-07-31 HISTORY — DX: Dyspnea, unspecified: R06.00

## 2017-07-31 HISTORY — DX: Unspecified osteoarthritis, unspecified site: M19.90

## 2017-07-31 LAB — CBC
HCT: 39.5 % (ref 36.0–46.0)
HEMOGLOBIN: 12.5 g/dL (ref 12.0–15.0)
MCH: 31.5 pg (ref 26.0–34.0)
MCHC: 31.6 g/dL (ref 30.0–36.0)
MCV: 99.5 fL (ref 78.0–100.0)
Platelets: 200 10*3/uL (ref 150–400)
RBC: 3.97 MIL/uL (ref 3.87–5.11)
RDW: 15.9 % — ABNORMAL HIGH (ref 11.5–15.5)
WBC: 9.2 10*3/uL (ref 4.0–10.5)

## 2017-07-31 LAB — BASIC METABOLIC PANEL
ANION GAP: 10 (ref 5–15)
BUN: 16 mg/dL (ref 6–20)
CHLORIDE: 101 mmol/L (ref 101–111)
CO2: 30 mmol/L (ref 22–32)
Calcium: 9.1 mg/dL (ref 8.9–10.3)
Creatinine, Ser: 0.75 mg/dL (ref 0.44–1.00)
GFR calc Af Amer: >60 mL/min (ref >60)
GFR calc non Af Amer: >60 mL/min (ref >60)
GLUCOSE: 86 mg/dL (ref 65–99)
POTASSIUM: 4 mmol/L (ref 3.5–5.1)
SODIUM: 141 mmol/L (ref 135–145)

## 2017-07-31 NOTE — Pre-Procedure Instructions (Addendum)
ESSENCE MERLE  07/31/2017      [facility] - [place], [place] - [address] [place] [address] Phone: [phone] Fax: [phone]  [facility] [address] - [place], [place] - [address] [place] [address] Phone: [phone] Fax: [phone]    Your procedure is scheduled on   Monday  08/04/17  Report to [facility] Admitting at  1030 A.M.  Call this number if you have problems the morning of surgery:  [phone]   Remember:  Do not eat food or drink liquids after midnight.  Take these medicines the morning of surgery with A SIP OF WATER   LEVOTHYROXINE, PANTOPRAZOLE, PREDNISONE  7 days prior to surgery STOP taking any Aspirin, Aleve, Naproxen, Ibuprofen, Motrin, Advil, Goody's, BC's, all herbal medications, fish oil, and all vitamins   Do not wear jewelry, make-up or nail polish.  Do not wear lotions, powders, or perfumes, or deoderant.  Do not shave 48 hours prior to surgery.  Men may shave face and neck.  Do not bring valuables to the hospital.  [facility] is not responsible for any belongings or valuables.  Contacts, dentures or bridgework may not be worn into surgery.  Leave your suitcase in the car.  After surgery it may be brought to your room.  For patients admitted to the hospital, discharge time will be determined by your treatment team.  Patients discharged the day of surgery will not be allowed to drive home.   Name and phone number of your driver:    Special instructions:  [place] - Preparing for Surgery  Before surgery, you can play an important role.  Because skin is not sterile, your skin needs to be as free of germs as possible.  You can reduce the number of germs on you skin by washing with CHG (chlorahexidine gluconate) soap before surgery.  CHG is an antiseptic cleaner which kills germs and bonds with the skin to continue killing germs  even after washing.  Please DO NOT use if you have an allergy to CHG or antibacterial soaps.  If your skin becomes reddened/irritated stop using the CHG and inform your nurse when you arrive at Short Stay.  Do not shave (including legs and underarms) for at least 48 hours prior to the first CHG shower.  You may shave your face.  Please follow these instructions carefully:   1.  Shower with CHG Soap the night before surgery and the                                morning of Surgery.  2.  If you choose to wash your hair, wash your hair first as usual with your       normal shampoo.  3.  After you shampoo, rinse your hair and body thoroughly to remove the                      Shampoo.  4.  Use CHG as you would any other liquid soap.  You can apply chg directly       to the skin and wash gently with scrungie or a clean washcloth.  5.  Apply the CHG Soap to your body ONLY FROM THE NECK DOWN.        Do not use on open wounds or open sores.  Avoid contact with  your eyes,       ears, mouth and genitals (private parts).  Wash genitals (private parts)       with your normal soap.  6.  Wash thoroughly, paying special attention to the area where your surgery        will be performed.  7.  Thoroughly rinse your body with warm water from the neck down.  8.  DO NOT shower/wash with your normal soap after using and rinsing off       the CHG Soap.  9.  Pat yourself dry with a clean towel.            10.  Wear clean pajamas.            11.  Place clean sheets on your bed the night of your first shower and do not        sleep with pets.  Day of Surgery  Do not apply any lotions/deoderants the morning of surgery.  Please wear clean clothes to the hospital/surgery center.    Please read over the following fact sheets that you were given. Surgical Site Infection Prevention

## 2017-08-03 ENCOUNTER — Encounter (HOSPITAL_COMMUNITY): Payer: Self-pay | Admitting: Surgery

## 2017-08-03 IMAGING — CT CT ABD-PELV W/ CM
3 of 5 series · 13 of 36 positions shown, 19 images · IV contrast (READICAT/WATER & [ID] ISOVUE 300)
Comparison: None.

CLINICAL DATA: Chronic right upper quadrant abdominal pain.

EXAM:
CT ABDOMEN AND PELVIS WITH CONTRAST
TECHNIQUE: Multidetector CT imaging of the abdomen and pelvis was performed
using the standard protocol following bolus administration of
intravenous contrast.
CONTRAST:  125mL [id number] IOPAMIDOL ([id number]) INJECTION 61%

[Series 3: abd/pelvis with · axial · 0.95mm/px · z∈[-356,-71]mm · 6 of 81 slices shown, 11 images]
[im 12/81  soft-tissue]
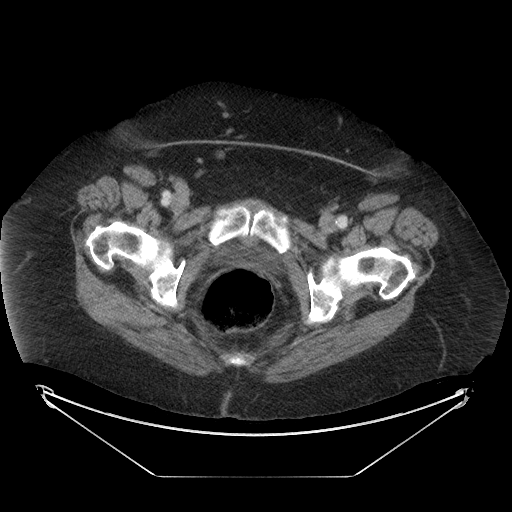
[im 12/81  bone]
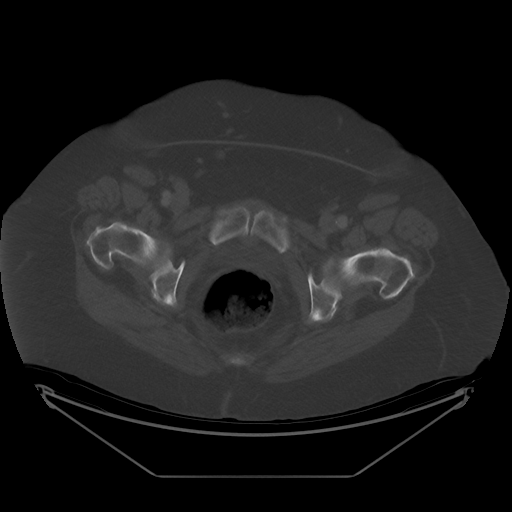
[im 23/81  soft-tissue]
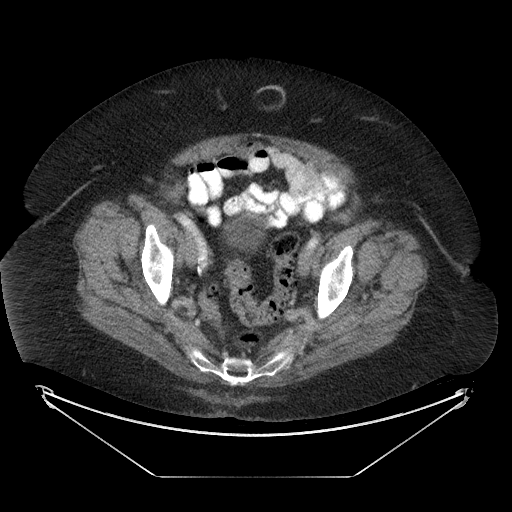
[im 35/81  soft-tissue]
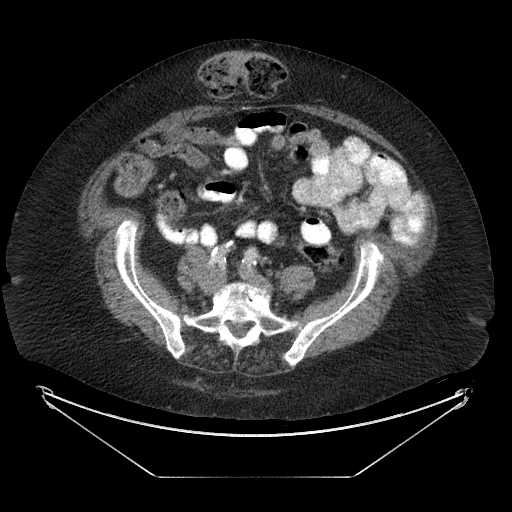
[im 35/81  lung]
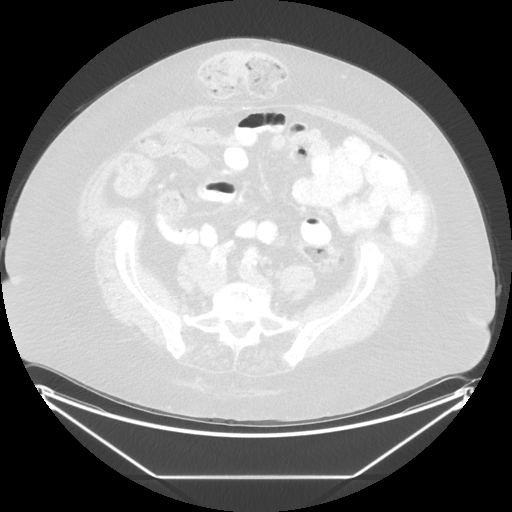
[im 46/81  soft-tissue]
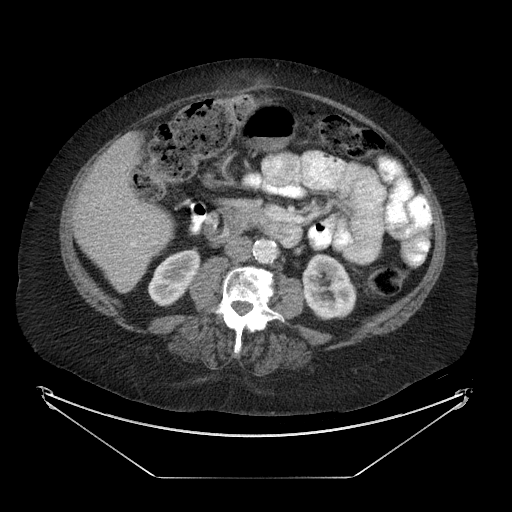
[im 46/81  lung]
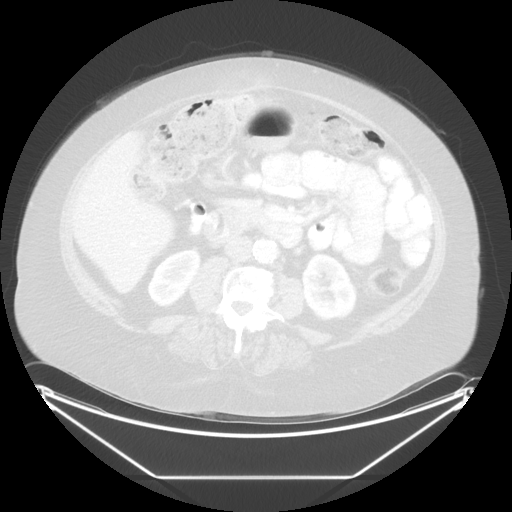
[im 58/81  soft-tissue]
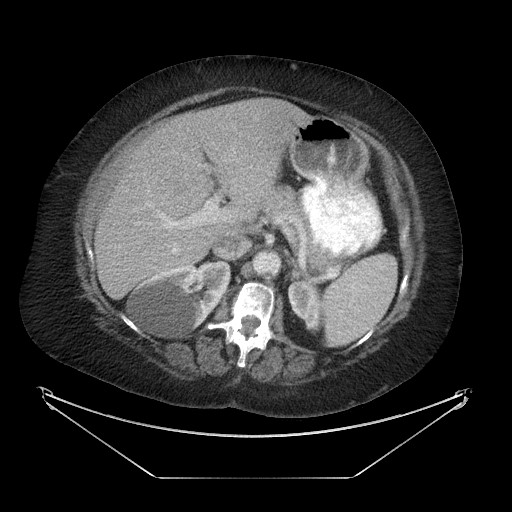
[im 58/81  lung]
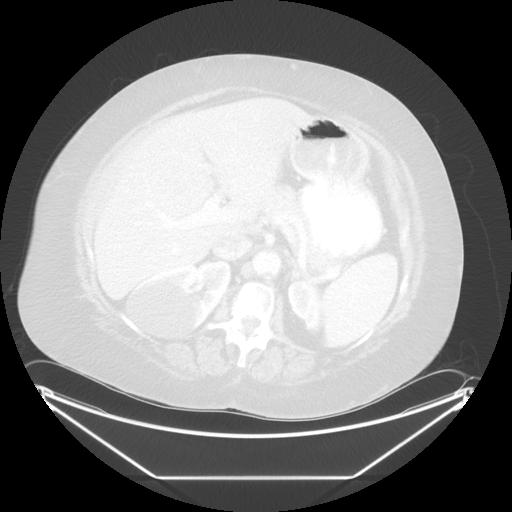
[im 69/81  soft-tissue]
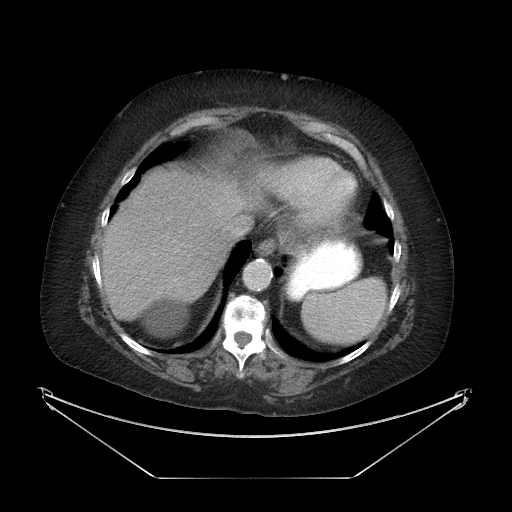
[im 69/81  lung]
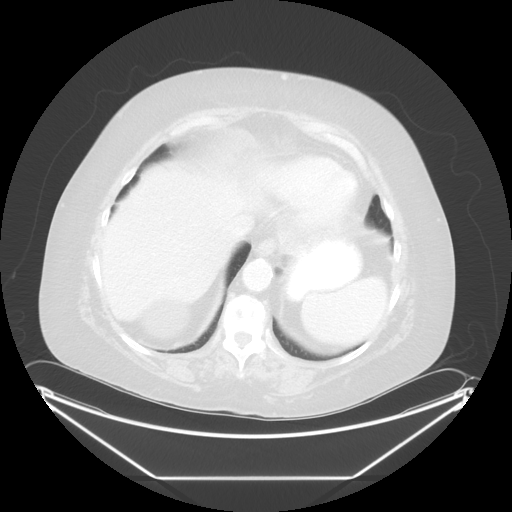

[Series 601: coronal body · coronal · 0.95mm/px · 1 of 169 slices shown, 2 images]
[im 57/169  soft-tissue]
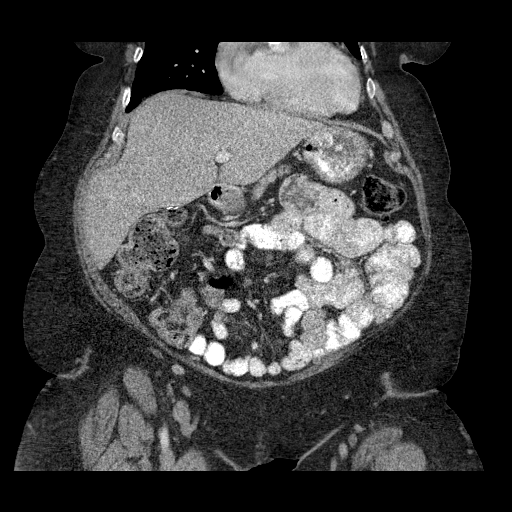
[im 57/169  bone]
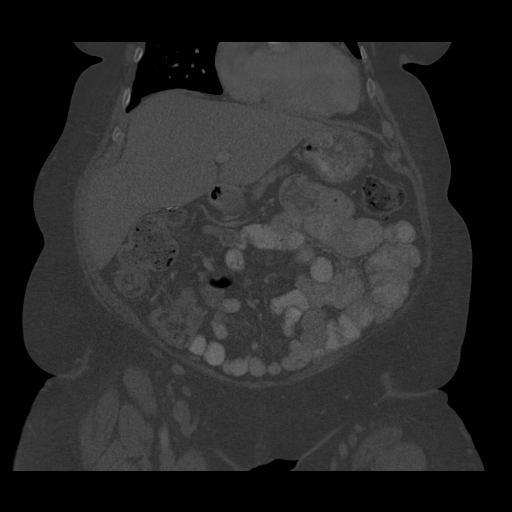

[Series 602: sagittal body · sagittal · 0.95mm/px · 6 of 195 slices shown]
[im 22/195  soft-tissue]
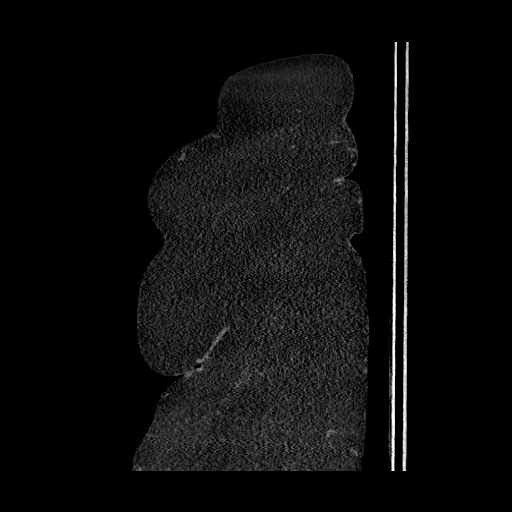
[im 44/195  soft-tissue]
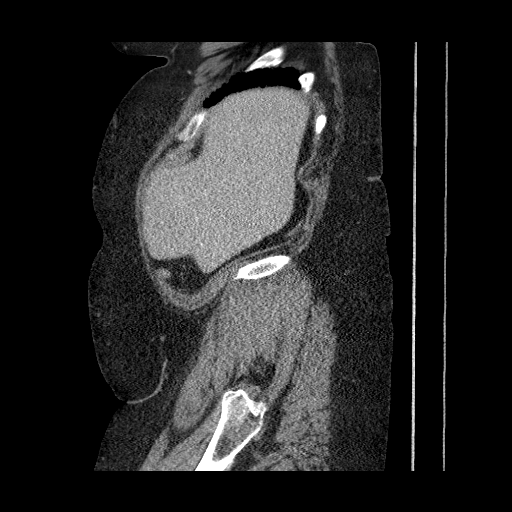
[im 65/195  soft-tissue]
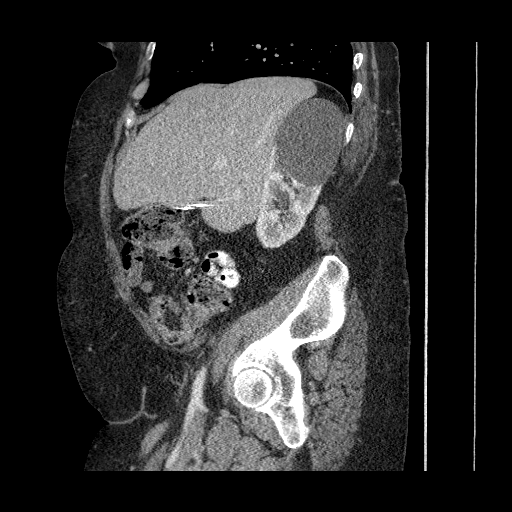
[im 87/195  soft-tissue]
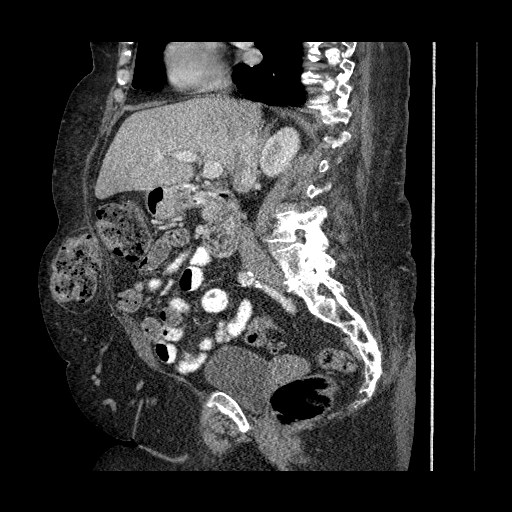
[im 108/195  soft-tissue]
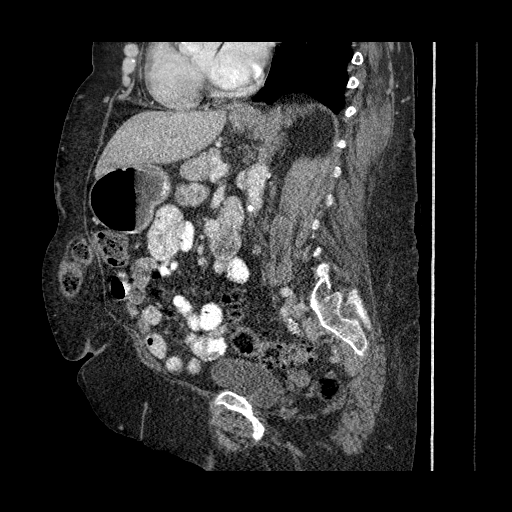
[im 130/195  soft-tissue]
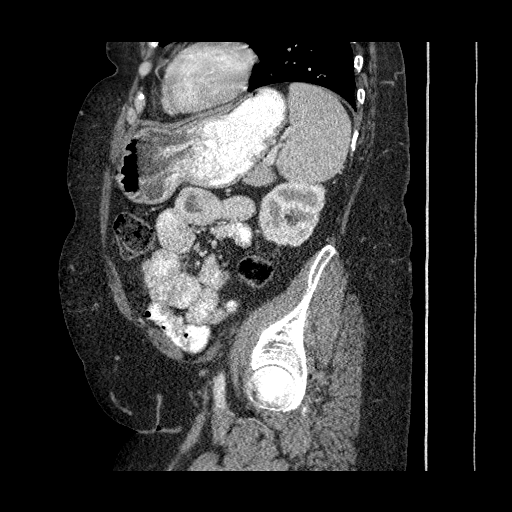

[13 of 36 positions shown; findings below may reference images not displayed]

FINDINGS: Lower chest: No acute abnormality.

Hepatobiliary: No focal liver abnormality is seen. Status post
cholecystectomy. No biliary dilatation.

Pancreas: Unremarkable. No pancreatic ductal dilatation or
surrounding inflammatory changes.

Spleen: Normal in size without focal abnormality.

Adrenals/Urinary Tract: Adrenal glands are unremarkable. Large right
renal cyst is noted. No hydronephrosis or renal obstruction is
noted. No renal or ureteral calculi are noted. Urinary bladder is
unremarkable.

Stomach/Bowel: There is no evidence of bowel obstruction. Large
amount of stool seen throughout the colon. Large periumbilical
hernia is noted which contains a loop of transverse colon, but does
not result in incarceration or obstruction. Sigmoid diverticulosis
is noted without inflammation. The appendix is not visualized.

Vascular/Lymphatic: Aortic atherosclerosis. No enlarged abdominal or
pelvic lymph nodes.

Reproductive: Uterus and bilateral adnexa are unremarkable.

Other: No abnormal fluid collection is noted.

Musculoskeletal: No acute or significant osseous findings.
IMPRESSION: Aortic atherosclerosis.

Large periumbilical hernia is noted which contains a loop of
transverse colon, but does not result in incarceration or
obstruction.

## 2017-08-03 NOTE — H&P (Signed)
General Surgery [facility]  HEILEY SHAIKH DOB: 1956-04-29 Separated / Language: [place] [name] / Race: White Female  History of Present Illness  The patient is a 61 year old female who presents with an abdominal wall hernia.  CC: ventral hernia, incarcerated  Patient returns for further evaluation of ventral hernia. Patient underwent CT scan on May 22, 2017. This shows a large periumbilical hernia containing a loop of transverse colon. There is no evidence of obstruction or strangulation. Patient does have a large renal cyst. Otherwise scan is unremarkable. Patient continues to note drainage and redness at the umbilicus.   Medication History Diclofenac Sodium (1% Gel, Transdermal) Active. Methotrexate (2.5MG  Tablet, Oral) Active. Levothyroxine Sodium ( Tablet, Oral) Active. Simvastatin (20MG  Tablet, Oral) Active. Mobic (15MG  Tablet, Oral) Active. Ultram (50MG  Tablet, Oral) Active. Lisinopril (10MG  Tablet, Oral) Active. Folic Acid (5MG /ML Solution, Injection) Active. Zinc (Oral) Specific strength unknown - Active. Vitamin D (Oral) Specific strength unknown - Active. Vitamin A (Oral) Specific strength unknown - Active. Meloxicam (7.5MG  Tablet, Oral) Active. Medications Reconciled  Vitals  Weight: 242.4 lb Height: 62in Body Surface Area: 2.07 m Body Mass Index: 44.34 kg/m  Temp.: 97.35F  Pulse: 99 (Regular)  BP: 160/70 (Sitting, Left Arm, Standard)   Physical Exam The physical exam findings are as follows: Note:CONSTITUTIONAL See vital signs recorded above  GENERAL APPEARANCE Development: normal Nutritional status: normal Gross deformities: none  SKIN Rash, lesions, ulcers: none Induration, erythema: none Nodules: none palpable  EYES Conjunctiva and lids: normal Pupils: equal and reactive Iris: normal bilaterally  EARS, NOSE, MOUTH, THROAT External ears: no lesion or deformity External nose: no lesion or  deformity Hearing: grossly normal Lips: no lesion or deformity Dentition: normal for age Oral mucosa: moist  NECK Symmetric: yes Trachea: midline Thyroid: Well-healed incision, no palpable masses in thyroid bed  CHEST Respiratory effort: normal Retraction or accessory muscle use: no Breath sounds: normal bilaterally Rales, rhonchi, wheeze: none  CARDIOVASCULAR Auscultation: regular rhythm, normal rate Murmurs: Grade 2-3 systolic murmur rub or left sternal border Pulses: carotid and radial pulse 2+ palpable  ABDOMEN Distension: none Masses: none palpable Tenderness: none Hepatosplenomegaly: not present Hernia: Large midline hernia above the level of the umbilicus but extending to the umbilicus with the umbilicus being everted. There is erythema and some drainage from the periumbilical skin consistent with yeast infection.  MUSCULOSKELETAL Station and gait: normal Digits and nails: no clubbing or cyanosis Muscle strength: grossly normal all extremities Range of motion: grossly normal all extremities Deformity: none  LYMPHATIC Cervical: none palpable Supraclavicular: none palpable  PSYCHIATRIC Oriented to person, place, and time: yes Mood and affect: normal for situation Judgment and insight: appropriate for situation    Assessment & Plan  UMBILICAL HERNIA, INCARCERATED (K42.0) INCISIONAL HERNIA, INCARCERATED (K43.0)  The patient returns to discuss results of CT scan of the abdomen and pelvis. This demonstrates incarcerated transverse colon without obstruction.  I have recommended repair of ventral incisional hernia with mesh patch. We discussed the options for surgery and I have recommended laparoscopic surgery if possible. Patient understands and wishes to proceed with this surgery. We have discussed the location of the surgical incisions. We discussed the hospital stay to be anticipated. We discussed restrictions on her activities after surgery and the  fact that she would be out of work for approximately 4 weeks. Patient will be in the hospital for approximately 3 days following her procedure. Patient understands and wishes to proceed.  Patient has a significant systolic murmur at  the upper left sternal border. She has not been evaluated by cardiology. We will ask for cardiology consultation and clearance prior to scheduling her for surgery.  The risks and benefits of the procedure have been discussed at length with the patient. The patient understands the proposed procedure, potential alternative treatments, and the course of recovery to be expected. All of the patient's questions have been answered at this time. The patient wishes to proceed with surgery.  Velora Heckler, MD, [facility] Office: [phone]

## 2017-08-04 ENCOUNTER — Ambulatory Visit (HOSPITAL_COMMUNITY): Payer: BLUE CROSS/BLUE SHIELD | Admitting: Certified Registered Nurse Anesthetist

## 2017-08-04 ENCOUNTER — Encounter (HOSPITAL_COMMUNITY)
Admission: RE | Disposition: A | Discharge status: Home / Self Care | Payer: Self-pay | Source: Ambulatory Visit | Attending: Surgery

## 2017-08-04 ENCOUNTER — Encounter (HOSPITAL_COMMUNITY): Payer: Self-pay | Admitting: Urology

## 2017-08-04 ENCOUNTER — Observation Stay (HOSPITAL_COMMUNITY)
Admission: RE | Admit: 2017-08-04 | Discharge: 2017-08-06 | Disposition: A | Discharge status: Home / Self Care | Payer: BLUE CROSS/BLUE SHIELD | Source: Ambulatory Visit | Attending: Surgery | Admitting: Surgery

## 2017-08-04 DIAGNOSIS — M069 Rheumatoid arthritis, unspecified: Secondary | ICD-10-CM | POA: Diagnosis not present

## 2017-08-04 DIAGNOSIS — Z79891 Long term (current) use of opiate analgesic: Secondary | ICD-10-CM | POA: Diagnosis not present

## 2017-08-04 DIAGNOSIS — I4949 Other premature depolarization: Secondary | ICD-10-CM | POA: Diagnosis not present

## 2017-08-04 DIAGNOSIS — Z880 Allergy status to penicillin: Secondary | ICD-10-CM | POA: Diagnosis not present

## 2017-08-04 DIAGNOSIS — I10 Essential (primary) hypertension: Secondary | ICD-10-CM | POA: Insufficient documentation

## 2017-08-04 DIAGNOSIS — Z79899 Other long term (current) drug therapy: Secondary | ICD-10-CM | POA: Insufficient documentation

## 2017-08-04 DIAGNOSIS — F17210 Nicotine dependence, cigarettes, uncomplicated: Secondary | ICD-10-CM | POA: Insufficient documentation

## 2017-08-04 DIAGNOSIS — E039 Hypothyroidism, unspecified: Secondary | ICD-10-CM | POA: Insufficient documentation

## 2017-08-04 DIAGNOSIS — K420 Umbilical hernia with obstruction, without gangrene: Secondary | ICD-10-CM | POA: Diagnosis not present

## 2017-08-04 DIAGNOSIS — E785 Hyperlipidemia, unspecified: Secondary | ICD-10-CM | POA: Insufficient documentation

## 2017-08-04 DIAGNOSIS — K219 Gastro-esophageal reflux disease without esophagitis: Secondary | ICD-10-CM | POA: Insufficient documentation

## 2017-08-04 DIAGNOSIS — N281 Cyst of kidney, acquired: Secondary | ICD-10-CM | POA: Diagnosis not present

## 2017-08-04 DIAGNOSIS — Z791 Long term (current) use of non-steroidal anti-inflammatories (NSAID): Secondary | ICD-10-CM | POA: Insufficient documentation

## 2017-08-04 DIAGNOSIS — Z888 Allergy status to other drugs, medicaments and biological substances status: Secondary | ICD-10-CM | POA: Insufficient documentation

## 2017-08-04 DIAGNOSIS — K430 Incisional hernia with obstruction, without gangrene: Secondary | ICD-10-CM | POA: Diagnosis present

## 2017-08-04 DIAGNOSIS — J439 Emphysema, unspecified: Secondary | ICD-10-CM | POA: Diagnosis not present

## 2017-08-04 DIAGNOSIS — K432 Incisional hernia without obstruction or gangrene: Secondary | ICD-10-CM | POA: Diagnosis present

## 2017-08-04 HISTORY — PX: VENTRAL HERNIA REPAIR: SHX424

## 2017-08-04 HISTORY — PX: INSERTION OF MESH: SHX5868

## 2017-08-04 LAB — BASIC METABOLIC PANEL
Anion gap: 8 (ref 5–15)
BUN: 11 mg/dL (ref 6–20)
CHLORIDE: 101 mmol/L (ref 101–111)
CO2: 30 mmol/L (ref 22–32)
Calcium: 9.1 mg/dL (ref 8.9–10.3)
Creatinine, Ser: 0.65 mg/dL (ref 0.44–1.00)
GFR calc Af Amer: >60 mL/min (ref >60)
GFR calc non Af Amer: >60 mL/min (ref >60)
Glucose, Bld: 134 mg/dL — ABNORMAL HIGH (ref 65–99)
POTASSIUM: 4 mmol/L (ref 3.5–5.1)
SODIUM: 139 mmol/L (ref 135–145)

## 2017-08-04 LAB — CBC
HEMATOCRIT: 40.2 % (ref 36.0–46.0)
HEMOGLOBIN: 12.4 g/dL (ref 12.0–15.0)
MCH: 30.9 pg (ref 26.0–34.0)
MCHC: 30.8 g/dL (ref 30.0–36.0)
MCV: 100.2 fL — AB (ref 78.0–100.0)
Platelets: 196 10*3/uL (ref 150–400)
RBC: 4.01 MIL/uL (ref 3.87–5.11)
RDW: 15.8 % — AB (ref 11.5–15.5)
WBC: 13.3 10*3/uL — AB (ref 4.0–10.5)

## 2017-08-04 LAB — MAGNESIUM: Magnesium: 1.4 mg/dL — ABNORMAL LOW (ref 1.7–2.4)

## 2017-08-04 SURGERY — REPAIR, HERNIA, VENTRAL, LAPAROSCOPIC
Anesthesia: General | Site: Abdomen

## 2017-08-04 MED ORDER — BUPIVACAINE-EPINEPHRINE (PF) 0.25% -1:200000 IJ SOLN
INTRAMUSCULAR | Status: AC
  Filled 2017-08-04: qty 30

## 2017-08-04 MED ORDER — ROCURONIUM BROMIDE 100 MG/10ML IV SOLN
INTRAVENOUS | Status: DC | PRN
  Administered 2017-08-04: 40 mg via INTRAVENOUS
  Administered 2017-08-04 (×2): 20 mg via INTRAVENOUS

## 2017-08-04 MED ORDER — ACETAMINOPHEN 325 MG PO TABS: 650.0000 mg | ORAL_TABLET | Freq: Four times a day (QID) | ORAL | Status: DC | PRN

## 2017-08-04 MED ORDER — LIDOCAINE 2% (20 MG/ML) 5 ML SYRINGE
INTRAMUSCULAR | Status: AC
  Filled 2017-08-04: qty 15

## 2017-08-04 MED ORDER — SODIUM CHLORIDE 0.9 % IR SOLN
Status: DC | PRN
  Administered 2017-08-04: 1000 mL

## 2017-08-04 MED ORDER — ONDANSETRON HCL 4 MG/2ML IJ SOLN
INTRAMUSCULAR | Status: AC
  Filled 2017-08-04: qty 6

## 2017-08-04 MED ORDER — 0.9 % SODIUM CHLORIDE (POUR BTL) OPTIME
TOPICAL | Status: DC | PRN
  Administered 2017-08-04: 1000 mL

## 2017-08-04 MED ORDER — NEOSTIGMINE METHYLSULFATE 5 MG/5ML IV SOSY
PREFILLED_SYRINGE | INTRAVENOUS | Status: AC
  Filled 2017-08-04: qty 5

## 2017-08-04 MED ORDER — CIPROFLOXACIN IN D5W 400 MG/200ML IV SOLN
INTRAVENOUS | Status: AC
  Filled 2017-08-04: qty 200

## 2017-08-04 MED ORDER — PHENYLEPHRINE HCL 10 MG/ML IJ SOLN
INTRAMUSCULAR | Status: DC | PRN
  Administered 2017-08-04: 120 ug via INTRAVENOUS
  Administered 2017-08-04 (×2): 80 ug via INTRAVENOUS
  Administered 2017-08-04: 120 ug via INTRAVENOUS

## 2017-08-04 MED ORDER — LEVOTHYROXINE SODIUM 100 MCG PO TABS
200.0000 ug | ORAL_TABLET | Freq: Every day | ORAL | Status: DC
  Administered 2017-08-05 – 2017-08-06 (×2): 200 ug via ORAL
  Filled 2017-08-04 (×2): qty 2

## 2017-08-04 MED ORDER — PHENYLEPHRINE 40 MCG/ML (10ML) SYRINGE FOR IV PUSH (FOR BLOOD PRESSURE SUPPORT)
PREFILLED_SYRINGE | INTRAVENOUS | Status: AC
  Filled 2017-08-04: qty 10

## 2017-08-04 MED ORDER — FUROSEMIDE 20 MG PO TABS
20.0000 mg | ORAL_TABLET | Freq: Every day | ORAL | Status: DC
  Administered 2017-08-04 – 2017-08-06 (×3): 20 mg via ORAL
  Filled 2017-08-04 (×3): qty 1

## 2017-08-04 MED ORDER — ONDANSETRON HCL 4 MG/2ML IJ SOLN
INTRAMUSCULAR | Status: DC | PRN
  Administered 2017-08-04: 4 mg via INTRAVENOUS

## 2017-08-04 MED ORDER — HYDROMORPHONE HCL 1 MG/ML IJ SOLN
1.0000 mg | INTRAMUSCULAR | Status: DC | PRN
  Administered 2017-08-04 – 2017-08-06 (×6): 1 mg via INTRAVENOUS
  Filled 2017-08-04 (×6): qty 1

## 2017-08-04 MED ORDER — EPHEDRINE SULFATE 50 MG/ML IJ SOLN
INTRAMUSCULAR | Status: DC | PRN
  Administered 2017-08-04: 15 mg via INTRAVENOUS
  Administered 2017-08-04: 10 mg via INTRAVENOUS

## 2017-08-04 MED ORDER — ROCURONIUM BROMIDE 10 MG/ML (PF) SYRINGE
PREFILLED_SYRINGE | INTRAVENOUS | Status: AC
  Filled 2017-08-04: qty 15

## 2017-08-04 MED ORDER — CALCIUM CHLORIDE 10 % IV SOLN
INTRAVENOUS | Status: AC
  Filled 2017-08-04: qty 10

## 2017-08-04 MED ORDER — KCL IN DEXTROSE-NACL 20-5-0.45 MEQ/L-%-% IV SOLN
INTRAVENOUS | Status: DC
  Administered 2017-08-04 – 2017-08-06 (×3): via INTRAVENOUS
  Filled 2017-08-04 (×3): qty 1000

## 2017-08-04 MED ORDER — PREDNISONE 5 MG PO TABS
5.0000 mg | ORAL_TABLET | Freq: Every day | ORAL | Status: DC
  Administered 2017-08-05 – 2017-08-06 (×2): 5 mg via ORAL
  Filled 2017-08-04 (×2): qty 1

## 2017-08-04 MED ORDER — PANTOPRAZOLE SODIUM 40 MG PO TBEC
40.0000 mg | DELAYED_RELEASE_TABLET | Freq: Two times a day (BID) | ORAL | Status: DC
  Administered 2017-08-04 – 2017-08-06 (×4): 40 mg via ORAL
  Filled 2017-08-04 (×4): qty 1

## 2017-08-04 MED ORDER — LACTATED RINGERS IV SOLN
INTRAVENOUS | Status: DC
  Administered 2017-08-04 (×2): via INTRAVENOUS

## 2017-08-04 MED ORDER — HYDROMORPHONE HCL 1 MG/ML IJ SOLN
0.2500 mg | INTRAMUSCULAR | Status: DC | PRN
  Administered 2017-08-04 (×4): 0.5 mg via INTRAVENOUS

## 2017-08-04 MED ORDER — FENTANYL CITRATE (PF) 100 MCG/2ML IJ SOLN
INTRAMUSCULAR | Status: DC | PRN
  Administered 2017-08-04 (×2): 100 ug via INTRAVENOUS
  Administered 2017-08-04: 50 ug via INTRAVENOUS

## 2017-08-04 MED ORDER — LISINOPRIL 5 MG PO TABS
5.0000 mg | ORAL_TABLET | Freq: Every day | ORAL | Status: DC
  Administered 2017-08-04 – 2017-08-06 (×3): 5 mg via ORAL
  Filled 2017-08-04 (×3): qty 1

## 2017-08-04 MED ORDER — ACETAMINOPHEN 650 MG RE SUPP: 650.0000 mg | Freq: Four times a day (QID) | RECTAL | Status: DC | PRN

## 2017-08-04 MED ORDER — POTASSIUM CHLORIDE CRYS ER 10 MEQ PO TBCR
10.0000 meq | EXTENDED_RELEASE_TABLET | Freq: Every day | ORAL | Status: DC
  Administered 2017-08-04 – 2017-08-06 (×3): 10 meq via ORAL
  Filled 2017-08-04 (×5): qty 1

## 2017-08-04 MED ORDER — SODIUM BICARBONATE 8.4 % IV SOLN
INTRAVENOUS | Status: AC
  Filled 2017-08-04: qty 50

## 2017-08-04 MED ORDER — LIDOCAINE HCL (CARDIAC) 20 MG/ML IV SOLN
INTRAVENOUS | Status: DC | PRN
  Administered 2017-08-04: 100 mg via INTRAVENOUS

## 2017-08-04 MED ORDER — CIPROFLOXACIN IN D5W 400 MG/200ML IV SOLN
400.0000 mg | INTRAVENOUS | Status: AC
  Administered 2017-08-04: 400 mg via INTRAVENOUS

## 2017-08-04 MED ORDER — SUGAMMADEX SODIUM 200 MG/2ML IV SOLN
INTRAVENOUS | Status: AC
  Filled 2017-08-04: qty 2

## 2017-08-04 MED ORDER — ONDANSETRON HCL 4 MG/2ML IJ SOLN: 4.0000 mg | Freq: Four times a day (QID) | INTRAMUSCULAR | Status: DC | PRN

## 2017-08-04 MED ORDER — MIDAZOLAM HCL 2 MG/2ML IJ SOLN
INTRAMUSCULAR | Status: AC
  Filled 2017-08-04: qty 2

## 2017-08-04 MED ORDER — DEXAMETHASONE SODIUM PHOSPHATE 10 MG/ML IJ SOLN
INTRAMUSCULAR | Status: AC
  Filled 2017-08-04: qty 3

## 2017-08-04 MED ORDER — SUGAMMADEX SODIUM 200 MG/2ML IV SOLN
INTRAVENOUS | Status: DC | PRN
  Administered 2017-08-04: 200 mg via INTRAVENOUS

## 2017-08-04 MED ORDER — BUPIVACAINE HCL (PF) 0.25 % IJ SOLN
INTRAMUSCULAR | Status: DC | PRN
  Administered 2017-08-04: 20 mL

## 2017-08-04 MED ORDER — DEXAMETHASONE SODIUM PHOSPHATE 10 MG/ML IJ SOLN
INTRAMUSCULAR | Status: DC | PRN
  Administered 2017-08-04: 5 mg via INTRAVENOUS

## 2017-08-04 MED ORDER — HYDROMORPHONE HCL 1 MG/ML IJ SOLN
INTRAMUSCULAR | Status: AC
  Administered 2017-08-04: 0.5 mg via INTRAVENOUS
  Filled 2017-08-04: qty 1

## 2017-08-04 MED ORDER — PROPOFOL 10 MG/ML IV BOLUS
INTRAVENOUS | Status: DC | PRN
  Administered 2017-08-04: 150 mg via INTRAVENOUS

## 2017-08-04 MED ORDER — CHLORHEXIDINE GLUCONATE CLOTH 2 % EX PADS: 6.0000 | MEDICATED_PAD | Freq: Once | CUTANEOUS | Status: DC

## 2017-08-04 MED ORDER — HYDROCODONE-ACETAMINOPHEN 5-325 MG PO TABS
ORAL_TABLET | ORAL | Status: AC
  Filled 2017-08-04: qty 2

## 2017-08-04 MED ORDER — SUCCINYLCHOLINE CHLORIDE 200 MG/10ML IV SOSY
PREFILLED_SYRINGE | INTRAVENOUS | Status: AC
  Filled 2017-08-04: qty 20

## 2017-08-04 MED ORDER — PROMETHAZINE HCL 25 MG/ML IJ SOLN: 6.2500 mg | INTRAMUSCULAR | Status: DC | PRN

## 2017-08-04 MED ORDER — HYDROCODONE-ACETAMINOPHEN 5-325 MG PO TABS
1.0000 | ORAL_TABLET | ORAL | Status: DC | PRN
  Administered 2017-08-04 – 2017-08-06 (×7): 2 via ORAL
  Filled 2017-08-04 (×7): qty 2

## 2017-08-04 MED ORDER — PROPOFOL 10 MG/ML IV BOLUS
INTRAVENOUS | Status: AC
  Filled 2017-08-04: qty 20

## 2017-08-04 MED ORDER — BUPIVACAINE HCL (PF) 0.5 % IJ SOLN
INTRAMUSCULAR | Status: AC
  Filled 2017-08-04: qty 30

## 2017-08-04 MED ORDER — ONDANSETRON 4 MG PO TBDP: 4.0000 mg | ORAL_TABLET | Freq: Four times a day (QID) | ORAL | Status: DC | PRN

## 2017-08-04 MED ORDER — TRAMADOL HCL 50 MG PO TABS: 50.0000 mg | ORAL_TABLET | Freq: Four times a day (QID) | ORAL | Status: DC | PRN

## 2017-08-04 MED ORDER — MIDAZOLAM HCL 5 MG/5ML IJ SOLN
INTRAMUSCULAR | Status: DC | PRN
  Administered 2017-08-04: 2 mg via INTRAVENOUS

## 2017-08-04 MED ORDER — FENTANYL CITRATE (PF) 250 MCG/5ML IJ SOLN
INTRAMUSCULAR | Status: AC
  Filled 2017-08-04: qty 5

## 2017-08-04 SURGICAL SUPPLY — 47 items
APPLIER CLIP LOGIC TI 5 (MISCELLANEOUS) IMPLANT
APR CLP MED LRG 33X5 (MISCELLANEOUS)
BINDER ABDOMINAL 12 ML 46-62 (SOFTGOODS) ×2 IMPLANT
CANISTER SUCT 3000ML PPV (MISCELLANEOUS) IMPLANT
CHLORAPREP W/TINT 26ML (MISCELLANEOUS) ×2 IMPLANT
COVER SURGICAL LIGHT HANDLE (MISCELLANEOUS) ×2 IMPLANT
DEVICE SECURE STRAP 25 ABSORB (INSTRUMENTS) ×4 IMPLANT
DEVICE TROCAR PUNCTURE CLOSURE (ENDOMECHANICALS) ×2 IMPLANT
ELECT REM PT RETURN 9FT ADLT (ELECTROSURGICAL) ×2
ELECTRODE REM PT RTRN 9FT ADLT (ELECTROSURGICAL) ×1 IMPLANT
GAUZE SPONGE 2X2 8PLY STRL LF (GAUZE/BANDAGES/DRESSINGS) ×1 IMPLANT
GLOVE BIOGEL PI IND STRL 6.5 (GLOVE) IMPLANT
GLOVE BIOGEL PI IND STRL 7.5 (GLOVE) IMPLANT
GLOVE BIOGEL PI INDICATOR 6.5 (GLOVE) ×2
GLOVE BIOGEL PI INDICATOR 7.5 (GLOVE) ×1
GLOVE SURG ORTHO 8.0 STRL STRW (GLOVE) ×2 IMPLANT
GLOVE SURG SS PI 6.5 STRL IVOR (GLOVE) ×2 IMPLANT
GLOVE SURG SS PI 7.5 STRL IVOR (GLOVE) ×2 IMPLANT
GLOVE SURG SS PI 8.0 STRL IVOR (GLOVE) ×4 IMPLANT
GOWN STRL REUS W/ TWL LRG LVL3 (GOWN DISPOSABLE) ×2 IMPLANT
GOWN STRL REUS W/ TWL XL LVL3 (GOWN DISPOSABLE) ×1 IMPLANT
GOWN STRL REUS W/TWL LRG LVL3 (GOWN DISPOSABLE) ×8
GOWN STRL REUS W/TWL XL LVL3 (GOWN DISPOSABLE) ×4
KIT BASIN OR (CUSTOM PROCEDURE TRAY) ×2 IMPLANT
KIT ROOM TURNOVER OR (KITS) ×2 IMPLANT
MARKER SKIN DUAL TIP RULER LAB (MISCELLANEOUS) ×2 IMPLANT
MESH VENTRALIGHT ST 8X10 (Mesh General) ×1 IMPLANT
NDL SPNL 22GX3.5 QUINCKE BK (NEEDLE) ×1 IMPLANT
NEEDLE SPNL 22GX3.5 QUINCKE BK (NEEDLE) ×2 IMPLANT
NS IRRIG 1000ML POUR BTL (IV SOLUTION) ×2 IMPLANT
PAD ARMBOARD 7.5X6 YLW CONV (MISCELLANEOUS) ×4 IMPLANT
SCISSORS LAP 5X35 DISP (ENDOMECHANICALS) ×2 IMPLANT
SET IRRIG TUBING LAPAROSCOPIC (IRRIGATION / IRRIGATOR) IMPLANT
SHEARS HARMONIC ACE PLUS 36CM (ENDOMECHANICALS) IMPLANT
SLEEVE ENDOPATH XCEL 5M (ENDOMECHANICALS) ×2 IMPLANT
SPONGE GAUZE 2X2 STER 10/PKG (GAUZE/BANDAGES/DRESSINGS) ×1
STRIP CLOSURE SKIN 1/2X4 (GAUZE/BANDAGES/DRESSINGS) ×2 IMPLANT
SUT MNCRL AB 4-0 PS2 18 (SUTURE) ×2 IMPLANT
SUT NOVA NAB DX-16 0-1 5-0 T12 (SUTURE) ×2 IMPLANT
TOWEL OR 17X24 6PK STRL BLUE (TOWEL DISPOSABLE) ×2 IMPLANT
TOWEL OR 17X26 10 PK STRL BLUE (TOWEL DISPOSABLE) ×2 IMPLANT
TRAY FOLEY CATH SILVER 16FR (SET/KITS/TRAYS/PACK) ×1 IMPLANT
TRAY FOLEY CATH SILVER 16FR LF (SET/KITS/TRAYS/PACK) ×1 IMPLANT
TRAY LAPAROSCOPIC MC (CUSTOM PROCEDURE TRAY) ×2 IMPLANT
TROCAR XCEL NON-BLD 11X100MML (ENDOMECHANICALS) ×2 IMPLANT
TROCAR XCEL NON-BLD 5MMX100MML (ENDOMECHANICALS) ×2 IMPLANT
TUBING INSUFFLATION (TUBING) ×2 IMPLANT

## 2017-08-04 NOTE — Anesthesia Procedure Notes (Signed)
Procedure Name: Intubation Date/Time: 08/04/2017 12:34 PM Performed by: Salli Quarry Citlali Gautney Pre-anesthesia Checklist: Patient identified, Emergency Drugs available, Suction available and Patient being monitored Patient Re-evaluated:Patient Re-evaluated prior to induction Oxygen Delivery Method: Circle System Utilized Preoxygenation: Pre-oxygenation with 100% oxygen Induction Type: IV induction Ventilation: Mask ventilation without difficulty Laryngoscope Size: Mac and 3 Grade View: Grade I Tube type: Oral Tube size: 7.0 mm Number of attempts: 1 Airway Equipment and Method: Stylet Placement Confirmation: ETT inserted through vocal cords under direct vision,  positive ETCO2 and breath sounds checked- equal and bilateral Secured at: 20 cm Tube secured with: Tape Dental Injury: Teeth and Oropharynx as per pre-operative assessment

## 2017-08-04 NOTE — Anesthesia Preprocedure Evaluation (Addendum)
Anesthesia Evaluation  Patient identified by MRN, date of birth, ID band Patient awake    Reviewed: Allergy & Precautions, NPO status , Patient's Chart, lab work & pertinent test results  Airway Mallampati: II  TM Distance: >3 FB Neck ROM: Full    Dental  (+) Edentulous Upper, Edentulous Lower   Pulmonary Current Smoker,  Emphysema    Pulmonary exam normal breath sounds clear to auscultation       Cardiovascular hypertension, Pt. on medications Normal cardiovascular exam Rhythm:Regular Rate:Normal  ECG: SR, rate 78. PAC's  ECHO:Normal pumping function, mild focal thickening of heart wall between the left and right chamber, mildly dilated aorta. Echo does not show any severe defect to prevent her from having surgery. She is cleared to proceed with her surgery. Please send clearance letter to [facility], she is a low risk patient.    Neuro/Psych negative neurological ROS     GI/Hepatic Neg liver ROS, GERD  Medicated and Controlled,  Endo/Other  Hypothyroidism Morbid obesity  Renal/GU negative Renal ROS     Musculoskeletal  (+) Arthritis , Rheumatoid disorders,    Abdominal (+) + obese,   Peds  Hematology negative hematology ROS (+)   Anesthesia Other Findings Hypercholesteremia Patient unable to remove rings  Reproductive/Obstetrics                            Anesthesia Physical Anesthesia Plan  ASA: III  Anesthesia Plan: General   Post-op Pain Management:    Induction: Intravenous  PONV Risk Score and Plan: 2 and Ondansetron, Dexamethasone and Midazolam  Airway Management Planned: Oral ETT  Additional Equipment:   Intra-op Plan:   Post-operative Plan: Extubation in OR  Informed Consent: I have reviewed the patients History and Physical, chart, labs and discussed the procedure including the risks, benefits and alternatives for the proposed anesthesia with the  patient or authorized representative who has indicated his/her understanding and acceptance.   Dental advisory given  Plan Discussed with: CRNA  Anesthesia Plan Comments:        Anesthesia Quick Evaluation

## 2017-08-04 NOTE — Transfer of Care (Signed)
Immediate Anesthesia Transfer of Care Note  Patient: [name]  Procedure(s) Performed: Procedure(s): LAPAROSCOPIC VENTRAL INCISIONAL HERNIA REPAIR WITH MESH (N/A) INSERTION OF MESH (N/A)  Patient Location: PACU  Anesthesia Type:General  Level of Consciousness: awake, alert , oriented and patient cooperative  Airway & Oxygen Therapy: Patient Spontanous Breathing and Patient connected to nasal cannula oxygen  Post-op Assessment: Report given to RN and Post -op Vital signs reviewed and stable  Post vital signs: Reviewed and stable  Last Vitals:  Vitals:   08/04/17 1130 08/04/17 1601  BP: (!) 168/61   Pulse:    Resp:    Temp:  (P) 37 C  SpO2:      Last Pain:  Vitals:   08/04/17 1056  TempSrc:   PainSc: 6          Complications: No apparent anesthesia complications

## 2017-08-04 NOTE — Anesthesia Postprocedure Evaluation (Signed)
Anesthesia Post Note  Patient: [name]  Procedure(s) Performed: Procedure(s) (LRB): LAPAROSCOPIC VENTRAL INCISIONAL HERNIA REPAIR WITH MESH (N/A) INSERTION OF MESH (N/A)     Patient location during evaluation: PACU Anesthesia Type: General Level of consciousness: awake and alert Pain management: pain level controlled Vital Signs Assessment: post-procedure vital signs reviewed and stable Respiratory status: spontaneous breathing, nonlabored ventilation, respiratory function stable and patient connected to nasal cannula oxygen Cardiovascular status: blood pressure returned to baseline and stable Postop Assessment: no signs of nausea or vomiting Anesthetic complications: no    Last Vitals:  Vitals:   08/04/17 1700 08/04/17 1724  BP:  (!) 141/65  Pulse: (!) 49 (!) 47  Resp: 17 12  Temp: 36.7 C 36.7 C  SpO2: 92% 98%    Last Pain:  Vitals:   08/04/17 1724  TempSrc: Oral  PainSc:                  Cecile Hearing

## 2017-08-04 NOTE — Brief Op Note (Signed)
08/04/2017  3:43 PM  PATIENT:  [name]  61 y.o. female  PRE-OPERATIVE DIAGNOSIS:  ventral incisional hernia, incarcerated  POST-OPERATIVE DIAGNOSIS:  ventral incisional hernia, incarcerated  PROCEDURE:  Procedure(s): LAPAROSCOPIC VENTRAL INCISIONAL HERNIA REPAIR WITH MESH (N/A) INSERTION OF MESH (N/A)  SURGEON:  Surgeon(s) and Role:    * Darnell Level, MD - Primary  ASSISTANTS: Magnus Ivan, RNFA   ANESTHESIA:   general  EBL:  Total I/O In: 1200 [I.V.:1200] Out: 215 [Urine:195; Blood:20]  BLOOD ADMINISTERED:none  DRAINS: none   LOCAL MEDICATIONS USED:  MARCAINE     SPECIMEN:  No Specimen  DISPOSITION OF SPECIMEN:  N/A  COUNTS:  YES  TOURNIQUET:  * No tourniquets in log *  DICTATION: .Other Dictation: Dictation Number [id number]  PLAN OF CARE: Admit to inpatient   PATIENT DISPOSITION:  telemetry bed   Delay start of Pharmacological VTE agent (>24hrs) due to surgical blood loss or risk of bleeding: yes  Velora Heckler, MD, [facility] Office: [phone]

## 2017-08-04 NOTE — Interval H&P Note (Signed)
History and Physical Interval Note:  08/04/2017 12:31 PM  [name]  has presented today for surgery, with the diagnosis of ventral incisional hernia, incarcerated.  The various methods of treatment have been discussed with the patient and family. After consideration of risks, benefits and other options for treatment, the patient has consented to    Procedure(s): LAPAROSCOPIC VENTRAL INCISIONAL HERNIA REPAIR WITH MESH (N/A) INSERTION OF MESH (N/A) as a surgical intervention .    The patient's history has been reviewed, patient examined, no change in status, stable for surgery.  I have reviewed the patient's chart and labs.  Questions were answered to the patient's satisfaction.    Velora Heckler, MD, [facility] Office: [phone]    Wynter Isaacs Judie Petit

## 2017-08-04 NOTE — Consult Note (Signed)
Cardiology Consultation:   Patient ID: [name]; [id number]; March 19, 1956   Admit date: 08/04/2017 Date of Consult: 08/04/2017  Primary Care Provider: Noemi Chapel, DO Primary Cardiologist: Theodore Demark PA-C recently reviewed with Dr. [place]  Chief Complaint: came here for hernia repair  Patient Profile:   [name] is a 61 y.o. female with a hx of rheumatoid arthritis on prednisone, emphysema with 40 years tobacco abuse, chronic dyspnea, Hashimoto's thyroiditis with hypothyroidism, hemophilia B, HTN, HLD, varicose veins who is being seen today for the evaluation of ectopy at the request of Dr. Gerrit Friends.  History of Present Illness:   She has no formal cardiac hx but was recently seen in the office by Theodore Demark PA-C for pre-op assessment due to h/o heart murmur. 2D echo 06/26/17 showed normal LV function, mild LVH and LVE, mild dysfunction with elevated LV filling pressure; mildly dilated ascending aorta; mild MR; mild LAE; mild TR with mildly elevated pulmonary pressure. She was cleared for surgery. She has had a ventral hernia containing a loop of transverse colon. She presented today for surgery and underwent laparoscopic ventral incisional hernia repair with mesh. Per discussion with cardmaster and nurse, patient was noted to have frequent ectopy after surgery thus cardiology asked to assess. She is asymptomatic with these. No palpitations or h/o such. No chest pain. Reports chronic DOE - ongoing tobacco for 40 yrs. Pre-op BMET and CBC were wnl. Pre-op EKG 06/17/17 also showed occasional PJCs.   Past Medical History:  Diagnosis Date  . Arthritis   . Dyspnea    W/ EXERTION, +  HUMIDITY   . Emphysema lung (HCC)   . GERD (gastroesophageal reflux disease)   . Hashimoto's thyroiditis   . Heart murmur   . Hemophilia B (HCC)   . HTN (hypertension)   . Hypercholesteremia   . Hypothyroid   . Rheumatoid arthritis(714.0)   . Varicose vein of leg     Past Surgical  History:  Procedure Laterality Date  . APPENDECTOMY    . CHOLECYSTECTOMY    . ELBOW SURGERY     LEFT  . ENDOSCOPIC VEIN LASER TREATMENT     RIGHT LEG  . EXPLORATORY LAPAROTOMY    . HAND SURGERY     RIGHT   . HEEL SPUR SURGERY     BILAT  . partial thyroidectomy  2015  . TONSILLECTOMY    . TUBAL LIGATION       Inpatient Medications: Scheduled Meds: . furosemide  20 mg Oral Daily  . HYDROcodone-acetaminophen      . [START ON 08/05/2017] levothyroxine  200 mcg Oral QAC breakfast  . lisinopril  5 mg Oral Daily  . pantoprazole  40 mg Oral BID  . potassium chloride  10 mEq Oral Daily  . [START ON 08/05/2017] predniSONE  5 mg Oral Daily   Continuous Infusions: . dextrose 5 % and 0.45 % NaCl with KCl 20 mEq/L     PRN Meds: acetaminophen **OR** acetaminophen, HYDROcodone-acetaminophen, HYDROmorphone (DILAUDID) injection, ondansetron **OR** ondansetron (ZOFRAN) IV, traMADol  Allergies:    Allergies  Allergen Reactions  . Penicillins Anaphylaxis, Swelling and Rash    Has patient had a PCN reaction causing immediate rash, facial/tongue/throat swelling, SOB or lightheadedness with hypotension: Yes Has patient had a PCN reaction causing severe rash involving mucus membranes or skin necrosis: No Has patient had a PCN reaction that required hospitalization No Has patient had a PCN reaction occurring within the last 10 years: No If all of the above  answers are "NO", then may proceed with Cephalosporin use.   . Latex Hives and Itching    Blisters (also)  . Oxycodone-Acetaminophen Nausea And Vomiting  . Tyloxapol Nausea And Vomiting  . Adhesive [Tape] Rash    Patient prefers paper tape  . Strawberry Extract Hives, Itching and Rash    Social History:   Social History   Social History  . Marital status: Legally Separated    Spouse name: N/A  . Number of children: N/A  . Years of education: N/A   Occupational History  . Customer Service [employer]   Social History Main Topics    . Smoking status: Current Some Day Smoker    Packs/day: 0.25    Years: 40.00    Types: Cigarettes    Last attempt to quit: 02/23/2014  . Smokeless tobacco: Never Used     Comment: trying to quit  . Alcohol use No  . Drug use: No  . Sexual activity: No   Other Topics Concern  . Not on file   Social History Narrative  . No narrative on file    Family History:   The patient's family history includes Alzheimer's disease in her maternal grandmother; Diabetes in her mother; Heart disease in her paternal grandmother; Kidney disease in her mother; Lung cancer in her father; Stroke in her maternal grandfather; Unexplained death in her paternal grandfather.  ROS:  Please see the history of present illness.  All other ROS reviewed and negative.     Physical Exam/Data:   Vitals:   08/04/17 1642 08/04/17 1658 08/04/17 1700 08/04/17 1724  BP: (!) 167/76 (!) 154/81  (!) 141/65  Pulse: 97 97 99 94  Resp: 12 10 17 12   Temp:   98 F (36.7 C) 98.1 F (36.7 C)  TempSrc:    Oral  SpO2: 94% 95% 92% 98%  Weight:        Intake/Output Summary (Last 24 hours) at 08/04/17 1825 Last data filed at 08/04/17 1539  Gross per 24 hour  Intake             1200 ml  Output              215 ml  Net              985 ml   Filed Weights   08/04/17 1049  Weight: 242 lb (109.8 kg)   Body mass index is 44.26 kg/m.  General: Well developed, well nourished, in no acute distress. Head: Normocephalic, atraumatic, sclera non-icteric, no xanthomas, nares are without discharge.  Neck: Negative for carotid bruits. JVD not elevated. Lungs: Clear bilaterally to auscultation without wheezes, rales, or rhonchi. Breathing is unlabored. Heart: RRR with S1 S2. No murmurs, rubs, or gallops appreciated. Abdomen: Soft, non-tender, non-distended with normoactive bowel sounds. No hepatomegaly. No rebound/guarding. No obvious abdominal masses. Msk:  Strength and tone appear normal for age. Extremities: No clubbing or  cyanosis. No edema.  Distal pedal pulses are 2+ and equal bilaterally. Neuro: Alert and oriented X 3. No facial asymmetry. No focal deficit. Moves all extremities spontaneously. Psych:  Responds to questions appropriately with a normal affect.  EKG:  The EKG was personally reviewed and demonstrates NSR 94bpm with frequent PVCs, TWI III, avF- there is some derangement with elevation of ST segment in leads I, avL but reviewed with Dr. 08/06/17 - NOT felt to represent STEMI. Prior TWI was noted in avL  Relevant CV Studies: 2D Echo 06/26/17 Study Conclusions  -  Left ventricle: The cavity size was mildly dilated. Wall   thickness was increased in a pattern of mild LVH. There was mild   focal basal hypertrophy of the septum. Systolic function was   normal. The estimated ejection fraction was in the range of 60%   to 65%. Wall motion was normal; there were no regional wall   motion abnormalities. Doppler parameters are consistent with   abnormal left ventricular relaxation (grade 1 diastolic   dysfunction). Doppler parameters are consistent with high   ventricular filling pressure. - Ascending aorta: The ascending aorta was mildly dilated. - Mitral valve: Calcified annulus. There was mild regurgitation. - Left atrium: The atrium was mildly dilated. - Atrial septum: There was increased thickness of the septum,   consistent with lipomatous hypertrophy. - Pulmonary arteries: Systolic pressure was mildly increased. PA   peak pressure: 44 mm Hg (S).  Impressions:  - Normal LV systolic function; mild LVH and LVE; mild diastolic   dysfunction with elevated LV filling pressure; mildly dilated   ascending aorta; mild MR; mild LAE; mild TR with mildly elevated   pulmonary pressure.  Laboratory Data:  Chemistry Recent Labs Lab 07/31/17 1547  NA 141  K 4.0  CL 101  CO2 30  GLUCOSE 86  BUN 16  CREATININE 0.75  CALCIUM 9.1  GFRNONAA >60  GFRAA >60  ANIONGAP 10    No results for  input(s): PROT, ALBUMIN, AST, ALT, ALKPHOS, BILITOT in the last 168 hours. Hematology Recent Labs Lab 07/31/17 1547  WBC 9.2  RBC 3.97  HGB 12.5  HCT 39.5  MCV 99.5  MCH 31.5  MCHC 31.6  RDW 15.9*  PLT 200   Radiology/Studies:  No results found.  Assessment and Plan:   1. Abnormal EKG with frequent ectopy - EKG reviewed with Dr. Donnie Aho given ST segment abnormalities in I, avL (EKG reporting out as STEMI, but clinically does not fit the picture) - also with TWI. He does not feel this represents STEMI. He feels the ST segment is likely skewed due to the beat being a PJC. She is not complaining of any chest pain. No change in stable chronic dyspnea. Her sinus beats do not have any ST elevation. Additionally, she had frequent PJCs by EKG in 06/2017. Lytes are in process. Add TSH in AM. Recent 2D Echo was normal. Doubt much to do clinically about these, but would follow on telemetry. F/u 12 lead in AM. Note that computer is autopopulating HR in the 40s in the pulse tab on vitals but ECG HR column is populating in the 90s which are the accurate values.  2. Hypothyroidism - check TSH with next lab draw.  3. HTN - follow BP post-operatively.  4. Longstanding tobacco abuse - counseled on importance of long-term cessation.  Signed, Laurann Montana, PA-C  08/04/2017 6:25 PM

## 2017-08-05 ENCOUNTER — Encounter (HOSPITAL_COMMUNITY): Payer: Self-pay | Admitting: General Practice

## 2017-08-05 DIAGNOSIS — Z791 Long term (current) use of non-steroidal anti-inflammatories (NSAID): Secondary | ICD-10-CM | POA: Diagnosis not present

## 2017-08-05 DIAGNOSIS — Z79899 Other long term (current) drug therapy: Secondary | ICD-10-CM | POA: Diagnosis not present

## 2017-08-05 DIAGNOSIS — K430 Incisional hernia with obstruction, without gangrene: Secondary | ICD-10-CM | POA: Diagnosis not present

## 2017-08-05 DIAGNOSIS — K420 Umbilical hernia with obstruction, without gangrene: Secondary | ICD-10-CM | POA: Diagnosis not present

## 2017-08-05 LAB — BASIC METABOLIC PANEL WITH GFR
Anion gap: 8 (ref 5–15)
BUN: 12 mg/dL (ref 6–20)
CO2: 29 mmol/L (ref 22–32)
Calcium: 9 mg/dL (ref 8.9–10.3)
Chloride: 100 mmol/L — ABNORMAL LOW (ref 101–111)
Creatinine, Ser: 0.72 mg/dL (ref 0.44–1.00)
GFR calc Af Amer: >60 mL/min
GFR calc non Af Amer: >60 mL/min
Glucose, Bld: 135 mg/dL — ABNORMAL HIGH (ref 65–99)
Potassium: 4.5 mmol/L (ref 3.5–5.1)
Sodium: 137 mmol/L (ref 135–145)

## 2017-08-05 LAB — TSH: TSH: 0.03 u[IU]/mL — AB (ref 0.350–4.500)

## 2017-08-05 NOTE — Op Note (Signed)
NAME:  [name], [name] NO.:  MEDICAL RECORD NO.:  [id number]  LOCATION:                                 FACILITY:  PHYSICIAN:  Velora Heckler, MD      DATE OF BIRTH:  11/03/1956  DATE OF PROCEDURE:  08/04/2017                              OPERATIVE REPORT   PREOPERATIVE DIAGNOSIS:  Incarcerated ventral incisional hernia.  POSTOPERATIVE DIAGNOSIS:  Incarcerated ventral incisional hernia.  PROCEDURE:  Laparoscopic repair of incarcerated ventral incisional hernia with mesh.  SURGEON:  Velora Heckler, MD.  ASSISTANT:  Magnus Ivan, RNFA.  ANESTHESIA:  General.  ESTIMATED BLOOD LOSS:  Minimal.  PREPARATION:  ChloraPrep.  COMPLICATIONS:  None.  INDICATIONS:  The patient is a 61 year old female with an incarcerated ventral incisional hernia.  The patient has undergone multiple prior surgical procedures.  She has developed an umbilical hernia as well as an incisional hernia.  CT scan on May 22, 2017, shows a large periumbilical hernia containing a loop of transverse colon.  There was no sign of obstruction or strangulation.  Hernia is not reducible.  She now comes to surgery for repair of ventral incisional hernia with mesh.  BODY OF REPORT:  Procedure was done in OR #2 at the [facility].  The patient was brought to the operating room, placed in supine position on the operating room table.  Following administration of general anesthesia, the patient was positioned and then prepped and draped in the usual aseptic fashion.  After ascertaining that an adequate level of anesthesia had been achieved, an incision was made at the left costal margin with a #15 blade.  Using a 5 mm Optiview trocar, the 5 mm laparoscope was introduced into the peritoneal cavity under direct vision.  Abdomen was insufflated with carbon dioxide.  A second operative port was placed in the left lower quadrant.  Inspection of the abdominal wall showed  incarcerated omentum within the umbilical hernia.  There was a loop of transverse colon in the ventral incisional hernia.  There were numerous other adhesions.  Using a combination of gentle blunt dissection, sharp dissection with the scissors, and judicious use of the Harmonic scalpel, the adhesions were taken down from the anterior abdominal wall and bowel was mobilized out of the hernia sac.  The entire anterior abdominal wall was cleared of adhesions.  The bowel appeared viable and there was no evidence of intestinal injury.  The falciform ligament was taken down for several centimeters to provide for a good plane for fixing the mesh above the level of the incisional hernia.  There were 2 fascial defects, one at the umbilicus measuring approximately 1 cm in diameter and one just to the left of midline in the upper abdomen measuring approximately 4 cm in greatest diameter. Measurements were taken from 4 cm cephalad to the incisional hernia defect to at least 4 cm inferior to the umbilical hernia defect.  This required placement of a 20 x 25 cm sheet of mesh.  Mesh was opened and prepared.  #1 Novafil sutures were placed at 8 positions around the circumference of the mesh.  The mesh  was then rolled, moistened, and inserted into the peritoneal cavity.  Two additional operating trocars were placed in the right lower quadrant and left upper quadrant.  Mesh was deployed and oriented properly.  Incisions were made on the anterior abdominal wall with a #11 blade.  Using an EndoCatch, the suture tags were retrieved and brought through the abdominal wall.  The mesh was then pulled taut to the anterior abdominal wall with wide coverage of the hernia defects.  Sutures were tied securely circumferentially. Next, using the SecureStrap device, concentric rows of SecureStrap tacks were placed around the periphery of the mesh.  An inter-concentric circle of SecureStrap staples was also placed.  There  was good approximation of the mesh to the anterior abdominal wall, again with wide overlap of the hernia defects.  Good hemostasis was noted.  Pneumoperitoneum was released and ports were removed.  Good hemostasis was noted at all port sites.  Port sites were anesthetized with local anesthetic.  Skin incisions were closed with interrupted 4-0 Monocryl subcuticular sutures.  Wounds were washed and dried and Steri-Strips were applied.  Sterile dressings were applied.  An abdominal binder was applied.  The patient was awakened from anesthesia and brought to the recovery room.  The patient tolerated the procedure well.   Velora Heckler, MD, [facility] Office: [phone]    TMG/MEDQ  D:  08/04/2017  T:  08/04/2017  Job:  [id number]

## 2017-08-05 NOTE — Progress Notes (Signed)
Cardiology has signed off - TSH noted to be low - further adjustment at discretion of primary team. She should f/u PCP at discharge for thyroid function. Call with q's. Dayna Dunn PA-C

## 2017-08-05 NOTE — Progress Notes (Signed)
  General Surgery [facility]  Assessment & Plan: POD#1 - status post lap ventral incisional hernia repair with mesh  Advance to regular diet  Cleared by cardiology - appreciate consult by Dr. Minerva Ends, ambulate in halls today  Discontinue Foley catheter  Likely home tomorrow AM        Velora Heckler, MD, [facility]       Office: [phone]    Chief Complaint: Incisional hernia with incarcerated transverse colon  Subjective: Patient "sore", son at bedside.  Tolerating clear liquids.  Has not been out in hall yet.  Objective: Vital signs in last 24 hours: Temp:  [97.4 F (36.3 C)-98.6 F (37 C)] 97.9 F (36.6 C) (08/21 0520) Pulse Rate:  [41-49] 41 (08/21 0520) Resp:  [10-17] 16 (08/21 0520) BP: (129-170)/(55-90) 129/59 (08/21 0520) SpO2:  [87 %-98 %] 97 % (08/21 0520) Last BM Date: 08/03/17  Intake/Output from previous day: 08/20 0701 - 08/21 0700 In: 2398 [P.O.:300; I.V.:2098] Out: 665 [Urine:645; Blood:20] Intake/Output this shift: Total I/O In: 120 [P.O.:120] Out: -   Physical Exam: HEENT - sclerae clear, mucous membranes moist Neck - soft Chest - clear bilaterally Cor - RRR Abdomen - soft, obese; dressings dry and intact; binder replaced Ext - no edema, non-tender Neuro - alert & oriented, no focal deficits  Lab Results:   Recent Labs  08/04/17 1809  WBC 13.3*  HGB 12.4  HCT 40.2  PLT 196   BMET  Recent Labs  08/04/17 1809 08/05/17 0342  NA 139 137  K 4.0 4.5  CL 101 100*  CO2 30 29  GLUCOSE 134* 135*  BUN 11 12  CREATININE 0.65 0.72  CALCIUM 9.1 9.0   PT/INR No results for input(s): LABPROT, INR in the last 72 hours. Comprehensive Metabolic Panel:    Component Value Date/Time   NA 137 08/05/2017 0342   NA 139 08/04/2017 1809   NA 144 02/18/2017 1540   NA 143 12/06/2016 1428   K 4.5 08/05/2017 0342   K 4.0 08/04/2017 1809   CL 100 (L) 08/05/2017 0342   CL 101  08/04/2017 1809   CO2 29 08/05/2017 0342   CO2 30 08/04/2017 1809   BUN 12 08/05/2017 0342   BUN 11 08/04/2017 1809   BUN 18 02/18/2017 1540   BUN 23 12/06/2016 1428   CREATININE 0.72 08/05/2017 0342   CREATININE 0.65 08/04/2017 1809   GLUCOSE 135 (H) 08/05/2017 0342   GLUCOSE 134 (H) 08/04/2017 1809   CALCIUM 9.0 08/05/2017 0342   CALCIUM 9.1 08/04/2017 1809   AST 12 02/18/2017 1540   AST 14 12/06/2016 1428   ALT 8 02/18/2017 1540   ALT 20 12/06/2016 1428   ALKPHOS 84 02/18/2017 1540   ALKPHOS 89 12/06/2016 1428   BILITOT <0.2 02/18/2017 1540   BILITOT 0.5 12/06/2016 1428   PROT 6.1 02/18/2017 1540   PROT 6.2 12/06/2016 1428   ALBUMIN 3.7 02/18/2017 1540   ALBUMIN 3.9 12/06/2016 1428    Studies/Results: No results found.    Tomothy Eddins M 08/05/2017  Patient ID: [name], female   DOB: 12-07-1956, 61 y.o.   MRN: [id number]

## 2017-08-06 DIAGNOSIS — K430 Incisional hernia with obstruction, without gangrene: Secondary | ICD-10-CM | POA: Diagnosis not present

## 2017-08-06 MED ORDER — HYDROCODONE-ACETAMINOPHEN 5-325 MG PO TABS: 1.0000 | ORAL_TABLET | ORAL | 0 refills | Status: DC | PRN

## 2017-08-06 NOTE — Discharge Summary (Signed)
Physician Discharge Summary [facility]  Patient ID: [name] MRN: [id number] DOB/AGE: 60-Nov-1957 60 y.o.  Admit date: 08/04/2017 Discharge date: 08/06/2017  Admission Diagnoses:  Ventral incarcerated incisional hernia  Discharge Diagnoses:  Principal Problem:   Incarcerated incisional hernia Active Problems:   Ventral incisional hernia without obstruction or gangrene   Discharged Condition: good  Hospital Course: Patient was admitted for observation following laparoscopic ventral incisional hernia surgery.  Post op course was uncomplicated.  Pain was well controlled.  Tolerated diet.  Patient was prepared for discharge home on POD#2.  Consults: Cardiology consult for arrhythmia - cleared.  Treatments: surgery: lap ventral incisional hernia repair with mesh  Discharge Exam: Blood pressure 128/60, pulse 76, temperature 98.7 F (37.1 C), temperature source Oral, resp. rate 17, height 5\' 2"  (1.575 m), weight 109.8 kg (242 lb), SpO2 93 %. HEENT - clear Neck - soft Chest - clear bilaterally Cor - RRR Abd - dressings dry and intact, binder on  Disposition: Home  Discharge Instructions    Diet - low sodium heart healthy    Complete by:  As directed    Discharge instructions    Complete by:  As directed    [facility]  LAPAROSCOPIC SURGERY:  POST-OP INSTRUCTIONS  Always review your discharge instruction sheet given to you by the facility where your surgery was performed.  A prescription for pain medication may be given to you upon discharge.  Take your pain medication as prescribed.  If narcotic pain medicine is not needed, then you may take acetaminophen (Tylenol) or ibuprofen (Advil) as needed.  Take your usually prescribed medications unless otherwise directed.  If you need a refill on your pain medication, please contact your pharmacy.  They will contact our office to request authorization. Prescriptions will not be filled  after 5 P.M. or on weekends.  You should follow a light diet the first few days after arrival home, such as soup and crackers or toast.  Be sure to include plenty of fluids daily.  Most patients will experience some swelling and bruising in the area of the incisions.  Ice packs will help.  Swelling and bruising can take several days to resolve.   It is common to experience some constipation after surgery.  Increasing fluid intake and taking a stool softener (such as Colace) will usually help or prevent this problem from occurring.  A mild laxative (Milk of Magnesia or Miralax) should be taken according to package instructions if there has been no bowel movement after 48 hours.  You will have steri-strips and a gauze dressing over your incisions.  You may remove the gauze bandage on the second day after surgery, and you may shower at that time.  Leave your steri-strips (small skin tapes) in place directly over the incision.  These strips should remain on the skin for 5-7 days and then be removed.  You may get them wet in the shower and pat them dry.  Any sutures or staples will be removed at the office during your follow-up visit.  ACTIVITIES:  You may resume regular (light) daily activities beginning the next day - such as daily self-care, walking, climbing stairs - gradually increasing activities as tolerated.  You may have sexual intercourse when it is comfortable.  Refrain from any heavy lifting or straining until approved by your doctor.  You may drive when you are no longer taking prescription pain medication, you can comfortably wear a seatbelt, and you can safely  maneuver your car and apply brakes.  You should see your doctor in the office for a follow-up appointment approximately 2-3 weeks after your surgery.  Make sure that you call for this appointment within a day or two after you arrive home to insure a convenient appointment time.  WHEN TO CALL YOUR DOCTOR: Fever over 101.0 Inability  to urinate Continued bleeding from incision Increased pain, redness, or drainage from the incision Increasing abdominal pain  The clinic staff is available to answer your questions during regular business hours.  Please don't hesitate to call and ask to speak to one of the nurses for clinical concerns.  If you have a medical emergency, go to the nearest emergency room or call 911.  A surgeon from [facility] is always on call for the hospital.  Velora Heckler, MD, [facility] Office: [phone] Toll Free:  [phone] FAX [phone]  Website: [email or web address]   Discharge wound care:    Complete by:  As directed    May remove gauze dressings today.  Leave Steristrips one week.  May begin showers today.   Increase activity slowly    Complete by:  As directed      Allergies as of 08/06/2017      Reactions   Penicillins Anaphylaxis, Swelling, Rash   Has patient had a PCN reaction causing immediate rash, facial/tongue/throat swelling, SOB or lightheadedness with hypotension: Yes Has patient had a PCN reaction causing severe rash involving mucus membranes or skin necrosis: No Has patient had a PCN reaction that required hospitalization No Has patient had a PCN reaction occurring within the last 10 years: No If all of the above answers are "NO", then may proceed with Cephalosporin use.   Latex Hives, Itching   Blisters (also)   Oxycodone-acetaminophen Nausea And Vomiting   Tyloxapol Nausea And Vomiting   Adhesive [tape] Rash   Patient prefers paper tape   Strawberry Extract Hives, Itching, Rash      Medication List    TAKE these medications   cyclobenzaprine 10 MG tablet Commonly known as:  FLEXERIL Take 1 tablet (10 mg total) by mouth 3 (three) times daily as needed for muscle spasms.   folic acid 1 MG tablet Commonly known as:  FOLVITE Take 1 mg by mouth daily.   furosemide 20 MG tablet Commonly known as:   LASIX Take 1 tablet (20 mg total) by mouth daily.   Hydrocodone-Acetaminophen 5-300 MG Tabs Commonly known as:  VICODIN Take 1-2 tablets by mouth every 6 (six) hours as needed (severe pain). What changed:  Another medication with the same name was added. Make sure you understand how and when to take each.   HYDROcodone-acetaminophen 5-325 MG tablet Commonly known as:  NORCO/VICODIN Take 1-2 tablets by mouth every 4 (four) hours as needed for moderate pain. What changed:  You were already taking a medication with the same name, and this prescription was added. Make sure you understand how and when to take each.   ibuprofen 200 MG tablet Commonly known as:  ADVIL,MOTRIN Take 400-600 mg by mouth every 6 (six) hours as needed (for pain or headaches).   levothyroxine 200 MCG tablet Commonly known as:  SYNTHROID Take 1 tablet (200 mcg total) by mouth daily.   lisinopril 10 MG tablet Commonly known as:  PRINIVIL,ZESTRIL Take 0.5 tablets (5 mg total) by mouth daily.   meloxicam 7.5 MG tablet Commonly known as:  MOBIC Take 2 tablets (15 mg total) by mouth  daily.   methotrexate 2.5 MG tablet Commonly known as:  RHEUMATREX Take 3 tablets (7.5 mg total) by mouth every 7 (seven) days. NO CHILD-PROOF BOTTLE CAPS PLEASE What changed:  how much to take  additional instructions   ONE-A-DAY WOMENS 50+ ADVANTAGE Tabs Take 1 tablet by mouth daily.   pantoprazole 40 MG tablet Commonly known as:  PROTONIX take 2 tablet by mouth once daily What changed:  See the new instructions.   potassium chloride 10 MEQ tablet Commonly known as:  K-DUR Take 1 tablet (10 mEq total) by mouth daily.   predniSONE 5 MG tablet Commonly known as:  DELTASONE Take 5 mg by mouth daily.   simvastatin 20 MG tablet Commonly known as:  ZOCOR take 1 tablet by mouth once daily AT 6PM What changed:  how much to take  how to take this  when to take this  additional instructions   VITAMIN A PO Take  3,000 Units by mouth daily.   vitamin C 1000 MG tablet Take 1,000 mg by mouth daily.   Vitamin D-3 1000 units Caps Take 1,000 Units by mouth daily.   zinc gluconate 50 MG tablet Take 50 mg by mouth daily.      Follow-up Information    Molt, Bethany, DO Follow up.   Specialty:  Internal Medicine Why:  Your TSH (thyroid function) was abnormal this admission at 0.030 indicating overactive thyroid - please contact your primary care doctor to discuss further adjustment of your thyroid medicine and monitoring. Contact information: [address] [place] [address] [phone]        Darnell Level, MD. Schedule an appointment as soon as possible for a visit in 3 week(s).   Specialty:  General Surgery Contact information: [address] [place] [address] [phone]           Velora Heckler, MD, [facility] Office: [phone]   Signed: Velora Heckler 08/06/2017, 7:52 AM

## 2017-08-06 NOTE — Progress Notes (Signed)
Pt was ambulating to the hall. Pain is controlled with oral narcotics. Discharge instructions was given to the pt. Discharged to home accompanied by son.

## 2017-08-06 NOTE — Discharge Instructions (Signed)
°  [facility] ° °LAPAROSCOPIC SURGERY:  POST-OP INSTRUCTIONS ° °Always review your discharge instruction sheet given to you by the facility where your surgery was performed. ° °A prescription for pain medication may be given to you upon discharge.  Take your pain medication as prescribed.  If narcotic pain medicine is not needed, then you may take acetaminophen (Tylenol) or ibuprofen (Advil) as needed. ° °Take your usually prescribed medications unless otherwise directed. ° °If you need a refill on your pain medication, please contact your pharmacy.  They will contact our office to request authorization. Prescriptions will not be filled after 5 P.M. or on weekends. ° °You should follow a light diet the first few days after arrival home, such as soup and crackers or toast.  Be sure to include plenty of fluids daily. ° °Most patients will experience some swelling and bruising in the area of the incisions.  Ice packs will help.  Swelling and bruising can take several days to resolve.  ° °It is common to experience some constipation after surgery.  Increasing fluid intake and taking a stool softener (such as Colace) will usually help or prevent this problem from occurring.  A mild laxative (Milk of Magnesia or Miralax) should be taken according to package instructions if there has been no bowel movement after 48 hours. ° °You will have steri-strips and a gauze dressing over your incisions.  You may remove the gauze bandage on the second day after surgery, and you may shower at that time.  Leave your steri-strips (small skin tapes) in place directly over the incision.  These strips should remain on the skin for 5-7 days and then be removed.  You may get them wet in the shower and pat them dry. ° °Any sutures or staples will be removed at the office during your follow-up visit. ° °ACTIVITIES:  You may resume regular (light) daily activities beginning the next day - such as daily self-care, walking,  climbing stairs - gradually increasing activities as tolerated.  You may have sexual intercourse when it is comfortable.  Refrain from any heavy lifting or straining until approved by your doctor. ° °You may drive when you are no longer taking prescription pain medication, you can comfortably wear a seatbelt, and you can safely maneuver your car and apply brakes. ° °You should see your doctor in the office for a follow-up appointment approximately 2-3 weeks after your surgery.  Make sure that you call for this appointment within a day or two after you arrive home to insure a convenient appointment time. ° °WHEN TO CALL YOUR DOCTOR: °1. Fever over 101.0 °2. Inability to urinate °3. Continued bleeding from incision °4. Increased pain, redness, or drainage from the incision °5. Increasing abdominal pain ° °The clinic staff is available to answer your questions during regular business hours.  Please don’t hesitate to call and ask to speak to one of the nurses for clinical concerns.  If you have a medical emergency, go to the nearest emergency room or call 911.  A surgeon from [facility] is always on call for the hospital. ° °Teea Ducey M. Sonoma Firkus, MD, FACS °[facility] °Office: [phone] °Toll Free:  [phone] °FAX [phone] ° °Website: [email or web address] °

## 2017-09-03 ENCOUNTER — Ambulatory Visit: Payer: BLUE CROSS/BLUE SHIELD | Admitting: Podiatry

## 2017-09-08 ENCOUNTER — Other Ambulatory Visit: Payer: Self-pay | Admitting: Internal Medicine

## 2017-09-08 ENCOUNTER — Encounter: Payer: Self-pay | Admitting: Internal Medicine

## 2017-09-10 ENCOUNTER — Encounter: Payer: Self-pay | Admitting: Cardiology

## 2017-09-16 NOTE — Progress Notes (Deleted)
Cardiology Office Note   Date:  09/16/2017   ID:  [name], DOB [birth year], MRN [id number]  PCP:  Noemi Chapel, DO  Cardiologist:   Peter Swaziland, MD   No chief complaint on file.     History of Present Illness: [name] is a 61 y.o. female who presents for follow up of murmur. She was seen initially in July by Theodore Demark PA-C for pre op evaluation for laproscopic ventral hernia repair. She has a history  of varicose veins with ablation, HTN, HLD, Hashimoto's thyroiditis with subsequent hypothyroidism, and RA. A murmur was noted. Echo was done as noted below without significant pathology. She did undergo successful surgery in August. In the post op period she was noted on telemetry to have premature junctional beats. These were asymptomatic and no further evaluation recommended.     Past Medical History:  Diagnosis Date  . Arthritis   . Dyspnea    W/ EXERTION, +  HUMIDITY   . Emphysema lung (HCC)   . GERD (gastroesophageal reflux disease)   . Hashimoto's thyroiditis   . Heart murmur   . Hemophilia B (HCC)   . HTN (hypertension)   . Hypercholesteremia   . Hypothyroid   . Rheumatoid arthritis(714.0)   . Varicose vein of leg     Past Surgical History:  Procedure Laterality Date  . APPENDECTOMY    . CHOLECYSTECTOMY    . ELBOW SURGERY     LEFT  . ENDOSCOPIC VEIN LASER TREATMENT     RIGHT LEG  . EXPLORATORY LAPAROTOMY    . HAND SURGERY     RIGHT   . HEEL SPUR SURGERY     BILAT  . INSERTION OF MESH N/A 08/04/2017   Procedure: INSERTION OF MESH;  Surgeon: Darnell Level, MD;  Location[facility];  Service: General;  Laterality: N/A;  . partial thyroidectomy  2015  . TONSILLECTOMY    . TUBAL LIGATION    . VENTRAL HERNIA REPAIR  08/04/2017  . VENTRAL HERNIA REPAIR N/A 08/04/2017   Procedure: LAPAROSCOPIC VENTRAL INCISIONAL HERNIA REPAIR WITH MESH;  Surgeon: Darnell Level, MD;  Location: [facility] OR;  Service: General;  Laterality: N/A;     Current Outpatient  Prescriptions  Medication Sig Dispense Refill  . Ascorbic Acid (VITAMIN C) 1000 MG tablet Take 1,000 mg by mouth daily.    . Cholecalciferol (VITAMIN D-3) 1000 units CAPS Take 1,000 Units by mouth daily.    . cyclobenzaprine (FLEXERIL) 10 MG tablet Take 1 tablet (10 mg total) by mouth 3 (three) times daily as needed for muscle spasms. 30 tablet 0  . folic acid (FOLVITE) 1 MG tablet Take 1 mg by mouth daily.   0  . furosemide (LASIX) 20 MG tablet Take 1 tablet (20 mg total) by mouth daily. 90 tablet 3  . HYDROcodone-acetaminophen (NORCO/VICODIN) 5-325 MG tablet Take 1-2 tablets by mouth every 4 (four) hours as needed for moderate pain. 20 tablet 0  . Hydrocodone-Acetaminophen (VICODIN) 5-300 MG TABS Take 1-2 tablets by mouth every 6 (six) hours as needed (severe pain). (Patient not taking: Reported on 07/31/2017) 60 each 0  . ibuprofen (ADVIL,MOTRIN) 200 MG tablet Take 400-600 mg by mouth every 6 (six) hours as needed (for pain or headaches).    Marland Kitchen levothyroxine (SYNTHROID) 200 MCG tablet Take 1 tablet (200 mcg total) by mouth daily. 30 tablet 3  . lisinopril (PRINIVIL,ZESTRIL) 10 MG tablet Take 0.5 tablets (5 mg total) by mouth daily. 30 tablet 3  .  meloxicam (MOBIC) 7.5 MG tablet Take 2 tablets (15 mg total) by mouth daily. (Patient not taking: Reported on 07/31/2017) 60 tablet 0  . methotrexate (RHEUMATREX) 2.5 MG tablet Take 3 tablets (7.5 mg total) by mouth every 7 (seven) days. NO CHILD-PROOF BOTTLE CAPS PLEASE (Patient taking differently: Take 20 mg by mouth every 7 (seven) days. WEDNESDAYS) 36 tablet 1  . Multiple Vitamins-Minerals (ONE-A-DAY WOMENS 50+ ADVANTAGE) TABS Take 1 tablet by mouth daily.    . pantoprazole (PROTONIX) 40 MG tablet take 2 tablet by mouth once daily (Patient taking differently: Take 80 mg by mouth at bedtime) 180 tablet 2  . potassium chloride (K-DUR) 10 MEQ tablet Take 1 tablet (10 mEq total) by mouth daily. 90 tablet 3  . predniSONE (DELTASONE) 5 MG tablet Take 5 mg by  mouth daily.    . simvastatin (ZOCOR) 20 MG tablet take 1 tablet by mouth once daily AT 6 PM 30 tablet 1  . VITAMIN A PO Take 3,000 Units by mouth daily.    Marland Kitchen zinc gluconate 50 MG tablet Take 50 mg by mouth daily.     No current facility-administered medications for this visit.     Allergies:   Penicillins; Latex; Oxycodone-acetaminophen; Tyloxapol; Adhesive [tape]; and Strawberry extract    Social History:  The patient  reports that she has been smoking Cigarettes.  She has a 10.00 pack-year smoking history. She has never used smokeless tobacco. She reports that she does not drink alcohol or use drugs.   Family History:  The patient's family history includes Alzheimer's disease in her maternal grandmother; Diabetes in her mother; Heart disease in her paternal grandmother; Kidney disease in her mother; Lung cancer in her father; Stroke in her maternal grandfather; Unexplained death in her paternal grandfather.    ROS:  Please see the history of present illness.   Otherwise, review of systems are positive for none.   All other systems are reviewed and negative.    PHYSICAL EXAM: VS:  There were no vitals taken for this visit. , BMI There is no height or weight on file to calculate BMI. GEN: Well nourished, well developed, in no acute distress  HEENT: normal  Neck: no JVD, carotid bruits, or masses Cardiac: RRR; no murmurs, rubs, or gallops,no edema  Respiratory:  clear to auscultation bilaterally, normal work of breathing GI: soft, nontender, nondistended, + BS MS: no deformity or atrophy  Skin: warm and dry, no rash Neuro:  Strength and sensation are intact Psych: euthymic mood, full affect   EKG:  EKG {ACTION; IS/IS BLT:90300923} ordered today. The ekg ordered today demonstrates ***   Recent Labs: 02/18/2017: ALT 8 08/04/2017: Hemoglobin 12.4; Magnesium 1.4; Platelets 196 08/05/2017: BUN 12; Creatinine, Ser 0.72; Potassium 4.5; Sodium 137; TSH 0.030    Lipid Panel      Component Value Date/Time   CHOL 224 (H) 11/15/2016 0543   TRIG 157 (H) 11/15/2016 0543   HDL 51 11/15/2016 0543   CHOLHDL 4.4 11/15/2016 0543   VLDL 31 11/15/2016 0543   LDLCALC 142 (H) 11/15/2016 0543      Wt Readings from Last 3 Encounters:  08/05/17 242 lb (109.8 kg)  07/31/17 242 lb 14.4 oz (110.2 kg)  06/24/17 241 lb 12.8 oz (109.7 kg)      Other studies Reviewed: Additional studies/ records that were reviewed today include:   Echo 06/26/17: Study Conclusions  - Left ventricle: The cavity size was mildly dilated. Wall   thickness was increased in a pattern  of mild LVH. There was mild   focal basal hypertrophy of the septum. Systolic function was   normal. The estimated ejection fraction was in the range of 60%   to 65%. Wall motion was normal; there were no regional wall   motion abnormalities. Doppler parameters are consistent with   abnormal left ventricular relaxation (grade 1 diastolic   dysfunction). Doppler parameters are consistent with high   ventricular filling pressure. - Ascending aorta: The ascending aorta was mildly dilated. - Mitral valve: Calcified annulus. There was mild regurgitation. - Left atrium: The atrium was mildly dilated. - Atrial septum: There was increased thickness of the septum,   consistent with lipomatous hypertrophy. - Pulmonary arteries: Systolic pressure was mildly increased. PA   peak pressure: 44 mm Hg (S).  Impressions:  - Normal LV systolic function; mild LVH and LVE; mild diastolic   dysfunction with elevated LV filling pressure; mildly dilated   ascending aorta; mild MR; mild LAE; mild TR with mildly elevated   pulmonary pressure.   ASSESSMENT AND PLAN:  1.  ***   Current medicines are reviewed at length with the patient today.  The patient {ACTIONS; HAS/DOES NOT HAVE:19233} concerns regarding medicines.  The following changes have been made:  {PLAN; NO CHANGE:13088:s}  Labs/ tests ordered today include: *** No  orders of the defined types were placed in this encounter.    Disposition:   FU with *** in {gen number 2-95:[id number]} {Days to years:10300}  Signed, Peter Swaziland, MD  09/16/2017 7:23 AM    [facility] [address] [place], [place], [address] Phone [phone], Fax [phone]

## 2017-09-18 ENCOUNTER — Ambulatory Visit: Payer: BLUE CROSS/BLUE SHIELD | Admitting: Cardiology

## 2017-10-01 ENCOUNTER — Ambulatory Visit (INDEPENDENT_AMBULATORY_CARE_PROVIDER_SITE_OTHER): Payer: BLUE CROSS/BLUE SHIELD | Admitting: Podiatry

## 2017-10-01 ENCOUNTER — Encounter: Payer: Self-pay | Admitting: Podiatry

## 2017-10-01 DIAGNOSIS — B351 Tinea unguium: Secondary | ICD-10-CM | POA: Diagnosis not present

## 2017-10-01 DIAGNOSIS — M79676 Pain in unspecified toe(s): Secondary | ICD-10-CM | POA: Diagnosis not present

## 2017-10-01 DIAGNOSIS — Q828 Other specified congenital malformations of skin: Secondary | ICD-10-CM

## 2017-10-01 NOTE — Progress Notes (Signed)
Complaint:  Visit Type: Patient returns to my office for continued preventative foot care services. Complaint: Patient states" my nails have grown long and thick and become painful to walk and wear shoes"  The patient presents for preventative foot care services. No changes to ROS.  She says the callus on the outside of left heel is painful.  Podiatric Exam: Vascular: dorsalis pedis and posterior tibial pulses are palpable bilateral. Capillary return is immediate. Temperature gradient is WNL. Skin turgor WNL  Sensorium: Normal Semmes Weinstein monofilament test. Normal tactile sensation bilaterally. Nail Exam: Pt has thick disfigured discolored nails with subungual debris noted bilateral entire nail hallux through fifth toenails Ulcer Exam: There is no evidence of ulcer or pre-ulcerative changes or infection. Orthopedic Exam: Muscle tone and strength are WNL. No limitations in general ROM. No crepitus or effusions noted. Foot type and digits show no abnormalities. Bony prominences are unremarkable. Skin: No Porokeratosis. No infection or ulcers  Diagnosis:  Onychomycosis, , Pain in right toe, pain in left toes,  Porokeratosis left foot.  Treatment & Plan Procedures and Treatment: Consent by patient was obtained for treatment procedures.   Debridement of mycotic and hypertrophic toenails, 1 through 5 bilateral and clearing of subungual debris. No ulceration, no infection noted. Debride porokeratosis lateral aspect left heel. Return Visit-Office Procedure: Patient instructed to return to the office for a follow up visit 3 months for continued evaluation and treatment.    Kristiane Morsch DPM 

## 2017-10-18 ENCOUNTER — Emergency Department (HOSPITAL_BASED_OUTPATIENT_CLINIC_OR_DEPARTMENT_OTHER)
Admission: EM | Admit: 2017-10-18 | Discharge: 2017-10-18 | Disposition: A | Discharge status: Home / Self Care | Payer: BLUE CROSS/BLUE SHIELD | Attending: Physician Assistant | Admitting: Physician Assistant

## 2017-10-18 ENCOUNTER — Encounter (HOSPITAL_BASED_OUTPATIENT_CLINIC_OR_DEPARTMENT_OTHER): Payer: Self-pay

## 2017-10-18 DIAGNOSIS — I10 Essential (primary) hypertension: Secondary | ICD-10-CM | POA: Diagnosis not present

## 2017-10-18 DIAGNOSIS — M545 Low back pain: Secondary | ICD-10-CM | POA: Diagnosis present

## 2017-10-18 DIAGNOSIS — Z79899 Other long term (current) drug therapy: Secondary | ICD-10-CM | POA: Insufficient documentation

## 2017-10-18 DIAGNOSIS — Z91040 Latex allergy status: Secondary | ICD-10-CM | POA: Insufficient documentation

## 2017-10-18 DIAGNOSIS — E785 Hyperlipidemia, unspecified: Secondary | ICD-10-CM | POA: Insufficient documentation

## 2017-10-18 DIAGNOSIS — Z791 Long term (current) use of non-steroidal anti-inflammatories (NSAID): Secondary | ICD-10-CM | POA: Insufficient documentation

## 2017-10-18 DIAGNOSIS — F17210 Nicotine dependence, cigarettes, uncomplicated: Secondary | ICD-10-CM | POA: Diagnosis not present

## 2017-10-18 DIAGNOSIS — M62830 Muscle spasm of back: Secondary | ICD-10-CM | POA: Insufficient documentation

## 2017-10-18 LAB — URINALYSIS, ROUTINE W REFLEX MICROSCOPIC
Bilirubin Urine: NEGATIVE
GLUCOSE, UA: NEGATIVE mg/dL
HGB URINE DIPSTICK: NEGATIVE
KETONES UR: NEGATIVE mg/dL
Nitrite: NEGATIVE
PROTEIN: NEGATIVE mg/dL
Specific Gravity, Urine: 1.02 (ref 1.005–1.030)
pH: 7 (ref 5.0–8.0)

## 2017-10-18 LAB — URINALYSIS, MICROSCOPIC (REFLEX)

## 2017-10-18 MED ORDER — CYCLOBENZAPRINE HCL 5 MG PO TABS: 5.0000 mg | ORAL_TABLET | Freq: Three times a day (TID) | ORAL | 0 refills | Status: DC | PRN

## 2017-10-18 MED ORDER — IBUPROFEN 600 MG PO TABS: 600.0000 mg | ORAL_TABLET | Freq: Four times a day (QID) | ORAL | 0 refills | Status: DC | PRN

## 2017-10-18 MED ORDER — KETOROLAC TROMETHAMINE 15 MG/ML IJ SOLN
15.0000 mg | Freq: Once | INTRAMUSCULAR | Status: AC
  Administered 2017-10-18: 15 mg via INTRAMUSCULAR
  Filled 2017-10-18: qty 1

## 2017-10-18 NOTE — ED Provider Notes (Signed)
[facility] EMERGENCY DEPARTMENT Provider Note   CSN: [id number] Arrival date & time: 10/18/17  4431    History   Chief Complaint Back pain   HPI [name] is a 61 y.o. female.  HPI  Complaining of L- sided hip and back pain which began about a week ago when she woke up. She feels as though she may have "slept wrong." Pain does not radiate down the leg and is localized to L paraspinal mm along thoracic spine.  She describes the pain as a throbbing sensation, constant, worse in certain positions, and laying on L side makes it feel better.  It has been progressively getting worse throughout the week and she has been limping.  Finds it hard to stand up straight and walk - normally ambulates without cane or walker.  No history of back pain in the past.   No history of falls or trauma. Denies bowel/bladder incontinence.   Denies fevers, chills, nausea, vomiting, diarrhea, rashes. No chest pain, shortness of breath, or palpitations. No weakness or numbness in hands or feet. She is taking Ibuprofen and has also tried heating pads.  No burning with urination.   Past Medical History:  Diagnosis Date  . Arthritis   . Dyspnea    W/ EXERTION, +  HUMIDITY   . Emphysema lung (HCC)   . GERD (gastroesophageal reflux disease)   . Hashimoto's thyroiditis   . Heart murmur   . Hemophilia B (HCC)   . HTN (hypertension)   . Hypercholesteremia   . Hypothyroid   . Rheumatoid arthritis(714.0)   . Varicose vein of leg     Patient Active Problem List   Diagnosis Date Noted  . Ventral incisional hernia without obstruction or gangrene 08/04/2017  . Acute bilateral low back pain without sciatica 06/24/2017  . Healthcare maintenance 05/07/2017  . Onychomycosis 05/07/2017  . Tricompartment osteoarthritis of both knees 02/19/2017  . Incarcerated incisional hernia 12/06/2016  . Venous stasis of lower extremity 12/06/2016  . Lower extremity ulceration (HCC) 11/14/2016  . Hyperlipidemia  11/14/2016  . Hypokalemia 04/23/2015  . Diarrhea 04/23/2015  . Acute kidney injury (HCC) 04/23/2015  . LIBIDO, DECREASED 12/21/2008  . LEG EDEMA 07/21/2008  . Vitamin B12 deficiency 07/08/2008  . Rheumatoid arthritis (HCC) 07/08/2008  . OBESITY 05/04/2008  . Essential hypertension 05/04/2008  . ALLERGIC RHINITIS 05/04/2008  . GERD 05/04/2008  . ELBOW PAIN, LEFT 05/04/2008  . URINARY INCONTINENCE 05/04/2008  . VARICOSE VEINS LOWER EXTREMITIES W/INFLAMMATION 11/02/2007  . Hypothyroidism 08/18/2007    Past Surgical History:  Procedure Laterality Date  . APPENDECTOMY    . CHOLECYSTECTOMY    . ELBOW SURGERY     LEFT  . ENDOSCOPIC VEIN LASER TREATMENT     RIGHT LEG  . EXPLORATORY LAPAROTOMY    . HAND SURGERY     RIGHT   . HEEL SPUR SURGERY     BILAT  . INSERTION OF MESH N/A 08/04/2017   Procedure: INSERTION OF MESH;  Surgeon: Darnell Level, MD;  Location: [facility] OR;  Service: General;  Laterality: N/A;  . partial thyroidectomy  2015  . TONSILLECTOMY    . TUBAL LIGATION    . VENTRAL HERNIA REPAIR  08/04/2017  . VENTRAL HERNIA REPAIR N/A 08/04/2017   Procedure: LAPAROSCOPIC VENTRAL INCISIONAL HERNIA REPAIR WITH MESH;  Surgeon: Darnell Level, MD;  Location: [facility] OR;  Service: General;  Laterality: N/A;    OB History    No data available     Home Medications  Prior to Admission medications   Medication Sig Start Date End Date Taking? Authorizing Provider  Ascorbic Acid (VITAMIN C) 1000 MG tablet Take 1,000 mg by mouth daily.    [provider]  Cholecalciferol (VITAMIN D-3) 1000 units CAPS Take 1,000 Units by mouth daily.    [provider]  cyclobenzaprine (FLEXERIL) 10 MG tablet Take 1 tablet (10 mg total) by mouth 3 (three) times daily as needed for muscle spasms. 06/24/17   Molt, Bethany, DO  cyclobenzaprine (FLEXERIL) 5 MG tablet Take 1 tablet (5 mg total) by mouth 3 (three) times daily as needed for muscle spasms. 10/18/17   Freddrick March, MD  folic acid  (FOLVITE) 1 MG tablet Take 1 mg by mouth daily.  05/05/17   [provider]  furosemide (LASIX) 20 MG tablet Take 1 tablet (20 mg total) by mouth daily. 06/17/17 09/15/17  Barrett, Joline Salt, PA-C  HYDROcodone-acetaminophen (NORCO/VICODIN) 5-325 MG tablet Take 1-2 tablets by mouth every 4 (four) hours as needed for moderate pain. 08/06/17   Darnell Level, MD  Hydrocodone-Acetaminophen (VICODIN) 5-300 MG TABS Take 1-2 tablets by mouth every 6 (six) hours as needed (severe pain). Patient not taking: Reported on 07/31/2017 06/24/17   Molt, Bethany, DO  ibuprofen (ADVIL,MOTRIN) 200 MG tablet Take 400-600 mg by mouth every 6 (six) hours as needed (for pain or headaches).    [provider]  ibuprofen (ADVIL,MOTRIN) 600 MG tablet Take 1 tablet (600 mg total) by mouth every 6 (six) hours as needed for mild pain or moderate pain. 10/18/17   Freddrick March, MD  levothyroxine (SYNTHROID) 200 MCG tablet Take 1 tablet (200 mcg total) by mouth daily. 06/24/17 06/24/18  Molt, Bethany, DO  lisinopril (PRINIVIL,ZESTRIL) 10 MG tablet Take 0.5 tablets (5 mg total) by mouth daily. 05/06/17 05/06/18  Molt, Bethany, DO  meloxicam (MOBIC) 7.5 MG tablet Take 2 tablets (15 mg total) by mouth daily. Patient not taking: Reported on 07/31/2017 04/30/17   Molt, Bethany, DO  methotrexate (RHEUMATREX) 2.5 MG tablet Take 3 tablets (7.5 mg total) by mouth every 7 (seven) days. NO CHILD-PROOF BOTTLE CAPS PLEASE Patient taking differently: Take 20 mg by mouth every 7 (seven) days. [facility] 04/03/17 04/03/18  Molt, Bethany, DO  Multiple Vitamins-Minerals (ONE-A-DAY WOMENS 50+ ADVANTAGE) TABS Take 1 tablet by mouth daily.    [provider]  pantoprazole (PROTONIX) 40 MG tablet take 2 tablet by mouth once daily Patient taking differently: Take 80 mg by mouth at bedtime 12/11/16   Inez Catalina, MD  potassium chloride (K-DUR) 10 MEQ tablet Take 1 tablet (10 mEq total) by mouth daily. 06/17/17 09/15/17  Barrett, Joline Salt, PA-C    predniSONE (DELTASONE) 5 MG tablet Take 5 mg by mouth daily. 07/23/17   [provider]  simvastatin (ZOCOR) 20 MG tablet take 1 tablet by mouth once daily AT 6 PM 09/09/17   Molt, Bethany, DO  VITAMIN A PO Take 3,000 Units by mouth daily.    [provider]  zinc gluconate 50 MG tablet Take 50 mg by mouth daily.    [provider]    Family History Family History  Problem Relation Age of Onset  . Diabetes Mother   . Kidney disease Mother   . Lung cancer Father   . Alzheimer's disease Maternal Grandmother   . Stroke Maternal Grandfather   . Heart disease Paternal Grandmother   . Unexplained death Paternal Grandfather     Social History Social History  Substance Use Topics  .  Smoking status: Current Some Day Smoker    Packs/day: 0.25    Years: 40.00    Types: Cigarettes    Last attempt to quit: 02/23/2014  . Smokeless tobacco: Never Used     Comment: trying to quit  . Alcohol use No     Allergies   Penicillins; Latex; Oxycodone-acetaminophen; Tyloxapol; Adhesive [tape]; and Strawberry extract  Review of Systems Review of Systems  Constitutional: Negative for appetite change, fatigue and fever.  Respiratory: Negative for cough, chest tightness and shortness of breath.   Cardiovascular: Negative for chest pain, palpitations and leg swelling.  Gastrointestinal: Negative for constipation, diarrhea, nausea and vomiting.  Genitourinary: Negative for difficulty urinating.  Musculoskeletal: Positive for back pain and gait problem. Negative for arthralgias, joint swelling and neck pain.   Physical Exam Updated Vital Signs BP 134/86 (BP Location: Left Arm)   Pulse 78   Temp 98.2 F (36.8 C) (Oral)   Resp 20   Ht 5\' 2"  (1.575 m)   Wt 99.8 kg (220 lb)   SpO2 96%   BMI 40.24 kg/m   Physical Exam Gen- 61 yo female, NAD  Skin - warm, dry, no rash or ecchymoses present  HEENT: NCAT, MMM, EOMI Neck - supple, full ROM  Chest - CTAB, work of  breathing is comfortable  Heart - RRR no MRG  Back: tenderness over paraspinal muscles of left-side of lower back  Abdomen - soft, NTND, +bs  Musculoskeletal - no edema, decreased ROM with flexion and extension of spine  Neuro - aox3, no focal deficits, strength 5/5 bilaterally in upper and lower extr, negative straight leg test, normal sensation  ED Treatments / Results  Labs (all labs ordered are listed, but only abnormal results are displayed) Labs Reviewed  URINALYSIS, ROUTINE W REFLEX MICROSCOPIC - Abnormal; Notable for the following:       Result Value   Leukocytes, UA SMALL (*)    All other components within normal limits  URINALYSIS, MICROSCOPIC (REFLEX) - Abnormal; Notable for the following:    Bacteria, UA FEW (*)    Squamous Epithelial / LPF 6-30 (*)    All other components within normal limits    EKG  EKG Interpretation None       Radiology No results found.  Procedures Procedures (including critical care time)  Medications Ordered in ED Medications  ketorolac (TORADOL) 15 MG/ML injection 15 mg (15 mg Intramuscular Given 10/18/17 0757)    Initial Impression / Assessment and Plan / ED Course  I have reviewed the triage vital signs and the nursing notes.  Pertinent labs & imaging results that were available during my care of the patient were reviewed by me and considered in my medical decision making (see chart for details).  61 yo female presents with Left sided back pain likely 2/2 muscle spasm.  UA negative for nitrites so low suspicion for UTI/pyelonephritis. No history of falls/trauma to suspect underlying fractures. Physical exam largely unremarkable except for tenderness to palpation over left paraspinal musculature.  Will treat with IM Toradol injection in ED and send home with medication for pain control.  -Rx for short course of Flexeril provided -Rx for Ibuprofen 600 mg Q6 PRN   -recommend gentle stretching and ROM exercises  -Return precautions  discussed   Final Clinical Impressions(s) / ED Diagnoses   Final diagnoses:  Muscle spasm of back    New Prescriptions Discharge Medication List as of 10/18/2017  8:19 AM    START taking these medications  Details  !! cyclobenzaprine (FLEXERIL) 5 MG tablet Take 1 tablet (5 mg total) by mouth 3 (three) times daily as needed for muscle spasms., Starting Sat 10/18/2017, Print    !! ibuprofen (ADVIL,MOTRIN) 600 MG tablet Take 1 tablet (600 mg total) by mouth every 6 (six) hours as needed for mild pain or moderate pain., Starting Sat 10/18/2017, Print     !! - Potential duplicate medications found. Please discuss with provider.      Freddrick March, MD [facility], PGY-2     Freddrick March, MD 10/18/17 1537    Abelino Derrick, MD 10/18/17 1352    Abelino Derrick, MD 10/18/17 1354

## 2017-10-18 NOTE — Discharge Instructions (Signed)
You were seen in the ED for back pain which is likely due to a muscle spasm.  You received a shot of Toradol for your pain here and I have prescribed you some Flexeril to take at home.  You can also take Ibuprofen and continue to use heating pads.  Gentle stretching may also help with your spasm.  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

## 2017-10-18 NOTE — ED Triage Notes (Signed)
Pt c/o left side back pain for the last few days that got much worse yesterday, worse with movement, no relief with ibuprofen, denies injury, denies groin numbness, denies incontinence

## 2017-10-21 ENCOUNTER — Encounter: Payer: Self-pay | Admitting: Internal Medicine

## 2017-10-22 ENCOUNTER — Ambulatory Visit: Payer: BLUE CROSS/BLUE SHIELD | Admitting: Internal Medicine

## 2017-10-22 ENCOUNTER — Encounter: Payer: Self-pay | Admitting: Internal Medicine

## 2017-10-22 DIAGNOSIS — M546 Pain in thoracic spine: Secondary | ICD-10-CM

## 2017-10-22 DIAGNOSIS — M62830 Muscle spasm of back: Secondary | ICD-10-CM | POA: Insufficient documentation

## 2017-10-22 DIAGNOSIS — M549 Dorsalgia, unspecified: Secondary | ICD-10-CM | POA: Insufficient documentation

## 2017-10-22 MED ORDER — NAPROXEN 500 MG PO TABS: 500.0000 mg | ORAL_TABLET | Freq: Two times a day (BID) | ORAL | 0 refills | Status: DC

## 2017-10-22 MED ORDER — DIAZEPAM 5 MG PO TABS: 5.0000 mg | ORAL_TABLET | Freq: Two times a day (BID) | ORAL | 0 refills | Status: DC | PRN

## 2017-10-22 NOTE — Patient Instructions (Signed)
I have prescribed naproxen (Aleve) 500 mg tablets to be taken 2 times daily for 10 days.  In conjunction I have prescribed Valium 5 mg tablets to be taken up to twice daily for 2 days as needed for muscle spasm relief.  You may continue to utilize a heating pad or hot shower as needed for relief, and continue the stretches as previously prescribed at the emergency department.  Your symptoms worsen, you develop new symptoms, or any concerning signs or symptoms please contact our clinic and revisit the ED.  Thank you for your visit to [facility] internal medicine clinic.

## 2017-10-22 NOTE — Progress Notes (Signed)
   CC: back pain  HPI:  [name] is a 61 y.o. female who states that approximately one week prior she woke with an a mild back ache. She thought nothing of it originally, but it has progressively worsened to the extent that she can no longer tolerate the pain. It is a 9/10 today at best. She can not stand without severe pain. Sitting is uncomfortable, lying flat is only possible in the left lateral decubitus position. Denied nausea, vomiting, diarrhea, fever, chills, radiating pain down her leg, loss of bowel or bladder control(has urinary leakage at baseline), dysuria, hematuria, or chest pain. Patient endorsed radiating pain into her left ribs/flank, mild improvement with ibuprofen and flexeril.   Past Medical History:  Diagnosis Date  . Arthritis   . Dyspnea    W/ EXERTION, +  HUMIDITY   . Emphysema lung (HCC)   . GERD (gastroesophageal reflux disease)   . Hashimoto's thyroiditis   . Heart murmur   . Hemophilia B (HCC)   . HTN (hypertension)   . Hypercholesteremia   . Hypothyroid   . Rheumatoid arthritis(714.0)   . Varicose vein of leg    Review of Systems:  ROS negative except as per HPI.  Physical Exam:  Vitals:   10/22/17 1503  BP: 140/74  Pulse: 90  Temp: 98.3 F (36.8 C)  TempSrc: Oral  SpO2: 98%  Weight: 235 lb (106.6 kg)  Height: 5\' 2"  (1.575 m)   Physical Exam  Constitutional: She appears well-developed. No distress.  Cardiovascular: Normal rate and regular rhythm.  Pulmonary/Chest: Effort normal and breath sounds normal.  Abdominal: Soft. Bowel sounds are normal.  Musculoskeletal: She exhibits tenderness (Left flank pain, CVA tenderness on percussion, ).  Neurological: No sensory deficit.  Skin: Skin is warm. No rash noted. No erythema.  Nursing note and vitals reviewed.   Assessment & Plan:   See Encounters Tab for problem based charting.  Patient seen with Dr. 

## 2017-10-22 NOTE — Assessment & Plan Note (Signed)
Assessment: See HPI for details. Patients presentation is consistent with lower thoracic left sided muscle spasm. There is no evidence of spinal compression or damage, no evidence of a red flag symptoms such as but not limited to loss of bowel or bladder control, radiculopathy, loss of sensation or history of trauma.  There does not appear to be a clear indication for imaging at this time.  Plan: Valium 5mg  for four tablets to be taken as needed but not more than q12 hours.  Naproxen 500mg  BID for no more than ten days Work excuse to prevent lifting of 5lbs or standing for greater than Advised patient to return if symptoms worsened, failed to resolve, or if new symptoms occurred.

## 2017-10-22 NOTE — Assessment & Plan Note (Signed)
Assessment:     Plan:

## 2017-10-27 NOTE — Progress Notes (Signed)
Internal Medicine Clinic Attending  I saw and evaluated the patient.  I personally confirmed the key portions of the history and exam documented by Dr. Harbrecht and I reviewed pertinent patient test results.  The assessment, diagnosis, and plan were formulated together and I agree with the documentation in the resident's note.  

## 2017-10-27 NOTE — Progress Notes (Deleted)
Cardiology Office Note   Date:  10/27/2017   ID:  [name], DOB September 29, 1956, MRN [id number]  PCP:  Noemi Chapel, DO  Cardiologist:   Shelise Maron [place], MD   No chief complaint on file.     History of Present Illness: [name] is a 61 y.o. female who presents for follow up of heart murmur. She was seen by Theodore Demark PA-C in July for pre op clearance. She has a history of varicose veins with ablation, HTN, HLD, Hashimoto's thyroiditis with subsequent hypothyroidism, and RA. Echo showed normal LV function with mild LVH, mild MR and TR and mild pulmonary HTN. She underwent incarcerated ventral hernia repair in August with Dr. Gerrit Friends. She was noted to have some PJCs post op that were asymptomatic and did not require further therapy.      Past Medical History:  Diagnosis Date  . Arthritis   . Dyspnea    W/ EXERTION, +  HUMIDITY   . Emphysema lung (HCC)   . GERD (gastroesophageal reflux disease)   . Hashimoto's thyroiditis   . Heart murmur   . Hemophilia B (HCC)   . HTN (hypertension)   . Hypercholesteremia   . Hypothyroid   . Rheumatoid arthritis(714.0)   . Varicose vein of leg     Past Surgical History:  Procedure Laterality Date  . APPENDECTOMY    . CHOLECYSTECTOMY    . ELBOW SURGERY     LEFT  . ENDOSCOPIC VEIN LASER TREATMENT     RIGHT LEG  . EXPLORATORY LAPAROTOMY    . HAND SURGERY     RIGHT   . HEEL SPUR SURGERY     BILAT  . partial thyroidectomy  2015  . TONSILLECTOMY    . TUBAL LIGATION    . VENTRAL HERNIA REPAIR  08/04/2017     Current Outpatient Medications  Medication Sig Dispense Refill  . Ascorbic Acid (VITAMIN C) 1000 MG tablet Take 1,000 mg by mouth daily.    . Cholecalciferol (VITAMIN D-3) 1000 units CAPS Take 1,000 Units by mouth daily.    . cyclobenzaprine (FLEXERIL) 10 MG tablet Take 1 tablet (10 mg total) by mouth 3 (three) times daily as needed for muscle spasms. 30 tablet 0  . cyclobenzaprine (FLEXERIL) 5 MG tablet Take 1  tablet (5 mg total) by mouth 3 (three) times daily as needed for muscle spasms. 10 tablet 0  . diazepam (VALIUM) 5 MG tablet Take 1 tablet (5 mg total) every 12 (twelve) hours as needed for up to 4 doses by mouth for muscle spasms. 4 tablet 0  . folic acid (FOLVITE) 1 MG tablet Take 1 mg by mouth daily.   0  . furosemide (LASIX) 20 MG tablet Take 1 tablet (20 mg total) by mouth daily. 90 tablet 3  . HYDROcodone-acetaminophen (NORCO/VICODIN) 5-325 MG tablet Take 1-2 tablets by mouth every 4 (four) hours as needed for moderate pain. 20 tablet 0  . Hydrocodone-Acetaminophen (VICODIN) 5-300 MG TABS Take 1-2 tablets by mouth every 6 (six) hours as needed (severe pain). (Patient not taking: Reported on 07/31/2017) 60 each 0  . ibuprofen (ADVIL,MOTRIN) 200 MG tablet Take 400-600 mg by mouth every 6 (six) hours as needed (for pain or headaches).    Marland Kitchen ibuprofen (ADVIL,MOTRIN) 600 MG tablet Take 1 tablet (600 mg total) by mouth every 6 (six) hours as needed for mild pain or moderate pain. 15 tablet 0  . levothyroxine (SYNTHROID) 200 MCG tablet Take 1 tablet (200 mcg  total) by mouth daily. 30 tablet 3  . lisinopril (PRINIVIL,ZESTRIL) 10 MG tablet Take 0.5 tablets (5 mg total) by mouth daily. 30 tablet 3  . meloxicam (MOBIC) 7.5 MG tablet Take 2 tablets (15 mg total) by mouth daily. (Patient not taking: Reported on 07/31/2017) 60 tablet 0  . methotrexate (RHEUMATREX) 2.5 MG tablet Take 3 tablets (7.5 mg total) by mouth every 7 (seven) days. NO CHILD-PROOF BOTTLE CAPS PLEASE (Patient taking differently: Take 20 mg by mouth every 7 (seven) days. WEDNESDAYS) 36 tablet 1  . Multiple Vitamins-Minerals (ONE-A-DAY WOMENS 50+ ADVANTAGE) TABS Take 1 tablet by mouth daily.    . naproxen (NAPROSYN) 500 MG tablet Take 1 tablet (500 mg total) 2 (two) times daily with a meal by mouth. 20 tablet 0  . pantoprazole (PROTONIX) 40 MG tablet take 2 tablet by mouth once daily (Patient taking differently: Take 80 mg by mouth at bedtime)  180 tablet 2  . potassium chloride (K-DUR) 10 MEQ tablet Take 1 tablet (10 mEq total) by mouth daily. 90 tablet 3  . predniSONE (DELTASONE) 5 MG tablet Take 5 mg by mouth daily.    . simvastatin (ZOCOR) 20 MG tablet take 1 tablet by mouth once daily AT 6 PM 30 tablet 1  . VITAMIN A PO Take 3,000 Units by mouth daily.    Marland Kitchen zinc gluconate 50 MG tablet Take 50 mg by mouth daily.     No current facility-administered medications for this visit.     Allergies:   Penicillins; Latex; Oxycodone-acetaminophen; Tyloxapol; Adhesive [tape]; and Strawberry extract    Social History:  The patient  reports that she has been smoking cigarettes.  She has a 10.00 pack-year smoking history. she has never used smokeless tobacco. She reports that she does not drink alcohol or use drugs.   Family History:  The patient's family history includes Alzheimer's disease in her maternal grandmother; Diabetes in her mother; Heart disease in her paternal grandmother; Kidney disease in her mother; Lung cancer in her father; Stroke in her maternal grandfather; Unexplained death in her paternal grandfather.    ROS:  Please see the history of present illness.   Otherwise, review of systems are positive for none.   All other systems are reviewed and negative.    PHYSICAL EXAM: VS:  There were no vitals taken for this visit. , BMI There is no height or weight on file to calculate BMI. GEN: Well nourished, well developed, in no acute distress  HEENT: normal  Neck: no JVD, carotid bruits, or masses Cardiac: RRR; no murmurs, rubs, or gallops,no edema  Respiratory:  clear to auscultation bilaterally, normal work of breathing GI: soft, nontender, nondistended, + BS MS: no deformity or atrophy  Skin: warm and dry, no rash Neuro:  Strength and sensation are intact Psych: euthymic mood, full affect   EKG:  EKG {ACTION; IS/IS [id number]} ordered today. The ekg ordered today demonstrates ***   Recent Labs: 02/18/2017: ALT  8 08/04/2017: Hemoglobin 12.4; Magnesium 1.4; Platelets 196 08/05/2017: BUN 12; Creatinine, Ser 0.72; Potassium 4.5; Sodium 137; TSH 0.030    Lipid Panel    Component Value Date/Time   CHOL 224 (H) 11/15/2016 0543   TRIG 157 (H) 11/15/2016 0543   HDL 51 11/15/2016 0543   CHOLHDL 4.4 11/15/2016 0543   VLDL 31 11/15/2016 0543   LDLCALC 142 (H) 11/15/2016 0543      Wt Readings from Last 3 Encounters:  10/22/17 235 lb (106.6 kg)  10/18/17 220 lb (99.8  kg)  08/05/17 242 lb (109.8 kg)      Other studies Reviewed: Additional studies/ records that were reviewed today include:  Echo 06/26/17: Study Conclusions  - Left ventricle: The cavity size was mildly dilated. Wall   thickness was increased in a pattern of mild LVH. There was mild   focal basal hypertrophy of the septum. Systolic function was   normal. The estimated ejection fraction was in the range of 60%   to 65%. Wall motion was normal; there were no regional wall   motion abnormalities. Doppler parameters are consistent with   abnormal left ventricular relaxation (grade 1 diastolic   dysfunction). Doppler parameters are consistent with high   ventricular filling pressure. - Ascending aorta: The ascending aorta was mildly dilated. - Mitral valve: Calcified annulus. There was mild regurgitation. - Left atrium: The atrium was mildly dilated. - Atrial septum: There was increased thickness of the septum,   consistent with lipomatous hypertrophy. - Pulmonary arteries: Systolic pressure was mildly increased. PA   peak pressure: 44 mm Hg (S).  Impressions:  - Normal LV systolic function; mild LVH and LVE; mild diastolic   dysfunction with elevated LV filling pressure; mildly dilated   ascending aorta; mild MR; mild LAE; mild TR with mildly elevated   pulmonary pressure.    ASSESSMENT AND PLAN:  1.  ***   Current medicines are reviewed at length with the patient today.  The patient {ACTIONS; HAS/DOES NOT HAVE:19233}  concerns regarding medicines.  The following changes have been made:  {PLAN; NO CHANGE:13088:s}  Labs/ tests ordered today include: *** No orders of the defined types were placed in this encounter.    Disposition:   FU with *** in {gen number 2-19:[id number]} {Days to years:10300}  Signed, Swayze Pries [place], MD  10/27/2017 8:22 AM    [facility] [address], [place], [place], [address] Phone [phone], Fax [phone]

## 2017-10-28 ENCOUNTER — Ambulatory Visit: Payer: BLUE CROSS/BLUE SHIELD | Admitting: Cardiology

## 2017-11-19 ENCOUNTER — Encounter: Payer: Self-pay | Admitting: Internal Medicine

## 2017-11-20 ENCOUNTER — Other Ambulatory Visit: Payer: Self-pay | Admitting: *Deleted

## 2017-11-20 DIAGNOSIS — E063 Autoimmune thyroiditis: Principal | ICD-10-CM

## 2017-11-20 DIAGNOSIS — E038 Other specified hypothyroidism: Secondary | ICD-10-CM

## 2017-11-20 MED ORDER — LEVOTHYROXINE SODIUM 200 MCG PO TABS: 200.0000 ug | ORAL_TABLET | Freq: Every day | ORAL | 1 refills | Status: DC

## 2017-11-20 MED ORDER — SIMVASTATIN 20 MG PO TABS: 20.0000 mg | ORAL_TABLET | Freq: Every day | ORAL | 1 refills | Status: DC

## 2017-12-02 ENCOUNTER — Ambulatory Visit: Payer: BLUE CROSS/BLUE SHIELD | Admitting: Internal Medicine

## 2017-12-02 ENCOUNTER — Encounter: Payer: Self-pay | Admitting: Internal Medicine

## 2017-12-02 ENCOUNTER — Other Ambulatory Visit: Payer: Self-pay

## 2017-12-02 VITALS — BP 144/72 | HR 90 | Temp 98.8°F | Ht 62.0 in | Wt 234.2 lb

## 2017-12-02 DIAGNOSIS — F17210 Nicotine dependence, cigarettes, uncomplicated: Secondary | ICD-10-CM

## 2017-12-02 DIAGNOSIS — Z832 Family history of diseases of the blood and blood-forming organs and certain disorders involving the immune mechanism: Secondary | ICD-10-CM | POA: Insufficient documentation

## 2017-12-02 DIAGNOSIS — Z7952 Long term (current) use of systemic steroids: Secondary | ICD-10-CM | POA: Diagnosis not present

## 2017-12-02 DIAGNOSIS — J069 Acute upper respiratory infection, unspecified: Secondary | ICD-10-CM

## 2017-12-02 DIAGNOSIS — B9789 Other viral agents as the cause of diseases classified elsewhere: Secondary | ICD-10-CM

## 2017-12-02 DIAGNOSIS — R05 Cough: Secondary | ICD-10-CM

## 2017-12-02 DIAGNOSIS — Z79899 Other long term (current) drug therapy: Secondary | ICD-10-CM | POA: Diagnosis not present

## 2017-12-02 DIAGNOSIS — M170 Bilateral primary osteoarthritis of knee: Secondary | ICD-10-CM | POA: Diagnosis not present

## 2017-12-02 DIAGNOSIS — Z79890 Hormone replacement therapy (postmenopausal): Secondary | ICD-10-CM | POA: Diagnosis not present

## 2017-12-02 MED ORDER — TRAMADOL HCL 50 MG PO TABS: 50.0000 mg | ORAL_TABLET | Freq: Three times a day (TID) | ORAL | 0 refills | Status: DC | PRN

## 2017-12-02 NOTE — Progress Notes (Signed)
   CC: acute cough, follow-up of pain  HPI:  [name] is a 61 y.o. F with medical history as outlined below here for follow-up of her chronic medical conditions. She also has complaint of cough, congestion, fatigue, headache, and myalgias since Thursday. Notes son with similar symptoms which resolved on their own. Cough not productive but feels that her chest is congested. Overall feels whiped out and notes symptoms came on suddenly. She did not get the flu shot this year. She has not been taking her Methotrexate or prednisone for the past 2 weeks.   Tricompartmental OA BL Knees: [name] was referred to a pain clinic for pain management however she describes to me a bad experience. She tells me they made her feel like "a drug addict" and just wrote her a script for meds without actually evaluating her. She did not fill these pills. She is very hesitant to be seen at another pain clinic.  Hypothyroidism: on Synthroid daily  HTN: On Lisinopril 10mg  daily  Past Medical History:  Diagnosis Date  . Arthritis   . Dyspnea    W/ EXERTION, +  HUMIDITY   . Emphysema lung (HCC)   . GERD (gastroesophageal reflux disease)   . Hashimoto's thyroiditis   . Heart murmur   . 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 HTN (hypertension)   . Hypercholesteremia   . Hypothyroid   . Rheumatoid arthritis(714.0)   . Varicose vein of leg    Review of Systems:   General: +fever, chills, fatigue. Denies fevers, chills, weight loss, HEENT: +sore throat. Denies changes in vision, dysphagia Cardiac: Denies CP, SOB, palpitations Pulmonary: +cough (denied sputum production), +sinus and chest congestion. Denies wheezes, hemoptysis Abd: Denies changes in bowels  Physical Exam: General: Alert, in no acute distress. Fatigued.  HEENT: Frequent cough. Not productive. No conjunctival injection. Sounds congested but no maxillary or frontal sinus tenderness to palpation. Erythematous oropharynx but tonsils without  exudate.  Cardiac: RRR, no MGR Pulmonary: CTA BL with normal WOB on RA. Able to speak in complete sentences.  Abd: Soft, non-tender. +bs Extremities: Warm, perfused. No significant pedal edema.   Vitals:   12/02/17 1436  BP: (!) 144/72  Pulse: 90  Temp: 98.8 F (37.1 C)  TempSrc: Oral  SpO2: 94%  Weight: 234 lb 3.2 oz (106.2 kg)  Height: 5\' 2"  (1.575 m)   Assessment & Plan:   See Encounters Tab for problem based charting.  Patient discussed with Dr. 12/04/17

## 2017-12-02 NOTE — Patient Instructions (Addendum)
FOLLOW-UP INSTRUCTIONS When: 1 month For: Pain medication check-up What to bring: Your bottle of tramadol  It was great seeing you today! I'm so sorry you got "the crud." This sounds like the viral illness that's spreading around (including myself!).   There are unfortunately no medications that are going to make you feel better right away however there are some that are going to help your symptoms. I want you to do the following regimen: -Please take Pseudophedrine 120mg  (extended release) twice daily for 1 week -Please take Tylenol every 8 hours (this will help muscle aches and fever) -Please take plain Mucinex (also known as guaifenesin) as directed. You can find the 12-hour caps so you can take it twice a day -Plenty of fluids and rest -It will take several weeks for your cough to clear completely  Today we also signed a pain contract for Tramadol. Please take 1 pill as needed every 8 hours for severe pain. I would like to see you back in 1 month to see how this dose is doing for you!  Please give me a call if you develop severe fevers or chills!

## 2017-12-03 ENCOUNTER — Encounter: Payer: Self-pay | Admitting: Internal Medicine

## 2017-12-03 DIAGNOSIS — B9789 Other viral agents as the cause of diseases classified elsewhere: Secondary | ICD-10-CM

## 2017-12-03 DIAGNOSIS — J069 Acute upper respiratory infection, unspecified: Secondary | ICD-10-CM | POA: Insufficient documentation

## 2017-12-03 NOTE — Assessment & Plan Note (Signed)
She was referred to a pain clinic for management of her OA of bilateral knees. She reportedly had a terrible experience there. She felt that she's been perceived as a drug seeker or drug addict. She actually spent 15 minutes in the car crying after her appointment. She did not fill her prescription received from them. She received several joint injections from her rheumatologist which have not helped.  The patient notes functional and social limitations due to this pain and seems to have very reasonable goals in terms of her pain control. She tells me she like to be able to walk around the grocery store and 7 using the motorized carts, be able to clean her house without taking frequent breaks and also be able to work more if possible. I actually believe this patient would be a good candidate to establish a pain contract with [place]. She is reliable, communicates well and has appropriate expectations for pain control. In addition she's also tried conservative therapies without improvement. -Reviewed patient in the controlled substance database which was appropriate. She is not filled any controlled substance for quite some time -Pain contract signed today and went over extensively -Will start patient on tramadol 50 mg 1-2 tablets daily as needed for pain. I called and talked to the patient encouraging her to limit its use. -I will see patient back in 1 month for follow-up. -She will need random UDS at some point in the future

## 2017-12-03 NOTE — Assessment & Plan Note (Addendum)
[name] is here with a 5-6 day history of sore throat, nonproductive cough, runny nose, sneezing, sinus congestion, fatigue, myalgias and headache. Notes with similar symptoms just prior to symptom onset that resolved spontaneously without treatment. Her lungs are clear on examination and she did not have any sinus tenderness. Tonsils were without exudate. I believe her presentation is consistent with a viral URI, possibly flu however she did present outside the window for Tamiflu. At this point we will treat patient conservatively with nasal decongestants, Mucinex and Tylenol. Patient aware to call clinic should symptoms not improve or worsen.

## 2017-12-05 NOTE — Progress Notes (Signed)
Internal Medicine Clinic Attending  Case discussed with Dr. Molt at the time of the visit.  We reviewed the resident's history and exam and pertinent patient test results.  I agree with the assessment, diagnosis, and plan of care documented in the resident's note. 

## 2017-12-31 ENCOUNTER — Ambulatory Visit: Payer: BLUE CROSS/BLUE SHIELD | Admitting: Podiatry

## 2018-01-07 ENCOUNTER — Other Ambulatory Visit: Payer: Self-pay | Admitting: *Deleted

## 2018-01-07 ENCOUNTER — Encounter: Payer: Self-pay | Admitting: Internal Medicine

## 2018-01-07 ENCOUNTER — Ambulatory Visit: Payer: BLUE CROSS/BLUE SHIELD | Admitting: Internal Medicine

## 2018-01-07 VITALS — BP 144/86 | HR 83 | Temp 98.2°F | Ht 62.0 in | Wt 232.4 lb

## 2018-01-07 DIAGNOSIS — F17210 Nicotine dependence, cigarettes, uncomplicated: Secondary | ICD-10-CM

## 2018-01-07 DIAGNOSIS — M069 Rheumatoid arthritis, unspecified: Secondary | ICD-10-CM | POA: Diagnosis not present

## 2018-01-07 DIAGNOSIS — E039 Hypothyroidism, unspecified: Secondary | ICD-10-CM | POA: Diagnosis not present

## 2018-01-07 DIAGNOSIS — Z0000 Encounter for general adult medical examination without abnormal findings: Secondary | ICD-10-CM

## 2018-01-07 DIAGNOSIS — I10 Essential (primary) hypertension: Secondary | ICD-10-CM

## 2018-01-07 DIAGNOSIS — M170 Bilateral primary osteoarthritis of knee: Secondary | ICD-10-CM

## 2018-01-07 MED ORDER — TRAMADOL HCL 50 MG PO TABS: 50.0000 mg | ORAL_TABLET | Freq: Three times a day (TID) | ORAL | 0 refills | Status: DC | PRN

## 2018-01-07 MED ORDER — PANTOPRAZOLE SODIUM 40 MG PO TBEC: DELAYED_RELEASE_TABLET | ORAL | 2 refills | Status: DC

## 2018-01-07 NOTE — Assessment & Plan Note (Addendum)
She has referral placed for screening mammogram which was encouraged today. She has been referred for screening colo in the past however has yet to complete. We discussed today stool card, mammogram and PAP however she declined all 3. She will think about the stool card and get back to me.

## 2018-01-07 NOTE — Assessment & Plan Note (Addendum)
Signed contract with our clinic last month. Initially tried trial of Tramadol which historically has only been mediocre at controlling her pain through prior experience with her. She has been taking 2 Tramadol daily, occasionally 3 when the weather is bad. She initially didn't notice much of a difference but she has noticed improvement over the past 2 weeks. She recalls last time she was on Tramadol that it took several weeks for her to notice a difference as well. She brought her pill bottle in and she had 15 tablets remaining. She has reasonable expectations in regards to pain control.  -1 month refill given for Tramadol, #75 without refill -UDS obtained today -Reviewed in [facility] database, appropriate.

## 2018-01-07 NOTE — Progress Notes (Signed)
   CC: follow-up of OA BL Knees, HTN  HPI:  [name] is a 62 y.o. F with tricompartmental OA of BL knees on pain contract with [place], RhA, hypothyroidism and hypertension here for follow-up of the above conditions. Please see problem-based charting for detail of her chronic medical conditions.   Past Medical History:  Diagnosis Date  . Arthritis   . Dyspnea    W/ EXERTION, +  HUMIDITY   . Emphysema lung (HCC)   . GERD (gastroesophageal reflux disease)   . Hashimoto's thyroiditis   . Heart murmur   . 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 HTN (hypertension)   . Hypercholesteremia   . Hypothyroid   . Rheumatoid arthritis(714.0)   . Varicose vein of leg    Review of Systems:   General: +Fatigue. Denies fevers, chills, weight loss HEENT: Denies changes in vision, dysphagia Cardiac: Denies CP Pulmonary: Denies cough, wheezes, PND Abd: Denies diarrhea, constipation or changes in bowels Extremities: +BL knee pain, weakness.   Physical Exam: General: Alert, in no acute distress. Pleasant and conversant. In wheelchair. HEENT: No icterus, injection or ptosis. No hoarseness or dysarthria  Cardiac: RRR, no MGR appreciated Pulmonary: Coarse breath sounds but clear. Able to speak in complete sentences on RA.  Abd: Soft, non-tender. +bs Extremities: Warm, perfused. No significant pedal edema. No significant joint effusion BL knees.  Vitals:   01/07/18 1408  BP: (!) 144/86  Pulse: 83  Temp: 98.2 F (36.8 C)  TempSrc: Oral  SpO2: 92%  Weight: 232 lb 6.4 oz (105.4 kg)  Height: 5\' 2"  (1.575 m)   Assessment & Plan:   See Encounters Tab for problem based charting.  Patient discussed with Dr. 

## 2018-01-07 NOTE — Patient Instructions (Signed)
It was great seeing you today! I'm glad you are doing well!  I have provided you with a refill of your prescriptions. Please come back in 1 month to make sure you are still doing well on this dose!

## 2018-01-08 NOTE — Progress Notes (Signed)
Internal Medicine Clinic Attending  Case discussed with Dr. Molt at the time of the visit.  We reviewed the resident's history and exam and pertinent patient test results.  I agree with the assessment, diagnosis, and plan of care documented in the resident's note. 

## 2018-01-12 LAB — TOXASSURE SELECT,+ANTIDEPR,UR

## 2018-01-28 ENCOUNTER — Ambulatory Visit: Payer: BLUE CROSS/BLUE SHIELD | Admitting: Podiatry

## 2018-01-28 ENCOUNTER — Encounter: Payer: Self-pay | Admitting: Podiatry

## 2018-01-28 DIAGNOSIS — M79676 Pain in unspecified toe(s): Secondary | ICD-10-CM | POA: Diagnosis not present

## 2018-01-28 DIAGNOSIS — Q828 Other specified congenital malformations of skin: Secondary | ICD-10-CM

## 2018-01-28 DIAGNOSIS — B351 Tinea unguium: Secondary | ICD-10-CM

## 2018-01-28 NOTE — Progress Notes (Signed)
Complaint:  Visit Type: Patient returns to my office for continued preventative foot care services. Complaint: Patient states" my nails have grown long and thick and become painful to walk and wear shoes"  The patient presents for preventative foot care services. No changes to ROS.  She says the callus on the outside of left heel is painful.  Podiatric Exam: Vascular: dorsalis pedis and posterior tibial pulses are palpable bilateral. Capillary return is immediate. Temperature gradient is WNL. Skin turgor WNL  Sensorium: Normal Semmes Weinstein monofilament test. Normal tactile sensation bilaterally. Nail Exam: Pt has thick disfigured discolored nails with subungual debris noted bilateral entire nail hallux through fifth toenails Ulcer Exam: There is no evidence of ulcer or pre-ulcerative changes or infection. Orthopedic Exam: Muscle tone and strength are WNL. No limitations in general ROM. No crepitus or effusions noted. Foot type and digits show no abnormalities. Bony prominences are unremarkable. Skin: No Porokeratosis. No infection or ulcers  Diagnosis:  Onychomycosis, , Pain in right toe, pain in left toes,  Porokeratosis left foot.  Treatment & Plan Procedures and Treatment: Consent by patient was obtained for treatment procedures.   Debridement of mycotic and hypertrophic toenails, 1 through 5 bilateral and clearing of subungual debris. No ulceration, no infection noted. Debride porokeratosis lateral aspect left heel. Return Visit-Office Procedure: Patient instructed to return to the office for a follow up visit 3 months for continued evaluation and treatment.    Helane Gunther DPM

## 2018-02-09 NOTE — Progress Notes (Signed)
   CC: follow-up of OA BL Knees, swelling  HPI:  [name] is a 62 y.o. F with tricompartmental OA of BL knees on pain contract with [place], diastolic CHF, RhA, hypothyroidism and hypertension here for follow-up of the above conditions. Please see problem-based charting for detail of her chronic medical conditions.   Past Medical History:  Diagnosis Date  . Arthritis   . Dyspnea    W/ EXERTION, +  HUMIDITY   . Emphysema lung (HCC)   . GERD (gastroesophageal reflux disease)   . Hashimoto's thyroiditis   . Heart murmur   . 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 HTN (hypertension)   . Hypercholesteremia   . Hypothyroid   . Rheumatoid arthritis(714.0)   . Varicose vein of leg    Review of Systems:   General: +Fatigue. +weight gain. Denies fevers, chills, weight loss HEENT: Denies changes in vision, dysphagia Cardiac: +DOE. Denies CP Pulmonary: Denies cough, wheezes, PND Abd: Denies diarrhea, constipation or changes in bowels Extremities: +BL knee pain. +Leg swelling.   Physical Exam: General: Alert, in no acute distress. Pleasant and conversant. HEENT: No icterus, injection or ptosis. No hoarseness or dysarthria  Cardiac: RRR, +faint murmur LUSB Pulmonary: Coarse breath sounds with faint bibasilar crackles. Normal WOB on RA.  Abd: Soft, non-tender. +bs Extremities: Warm, perfused. 2+ pedal edema BL LE to knees. Skin changes of chronic venous insufficiency.  Vitals:   02/10/18 1338 02/10/18 1402  BP: (!) 187/97 (!) 164/85  Pulse: 94 77  Temp: 98.2 F (36.8 C)   TempSrc: Oral   Weight: 243 lb 8 oz (110.5 kg)   Pulse oximetry on room air is: 95% at rest 88-89% with ambulation.   Assessment & Plan:   See Encounters Tab for problem based charting.  Patient discussed with Dr. Cleda [name]

## 2018-02-10 ENCOUNTER — Other Ambulatory Visit: Payer: Self-pay

## 2018-02-10 ENCOUNTER — Ambulatory Visit: Payer: BLUE CROSS/BLUE SHIELD | Admitting: Internal Medicine

## 2018-02-10 ENCOUNTER — Encounter: Payer: Self-pay | Admitting: Internal Medicine

## 2018-02-10 VITALS — BP 164/85 | HR 77 | Temp 98.2°F | Wt 243.5 lb

## 2018-02-10 DIAGNOSIS — Z79899 Other long term (current) drug therapy: Secondary | ICD-10-CM | POA: Diagnosis not present

## 2018-02-10 DIAGNOSIS — M170 Bilateral primary osteoarthritis of knee: Secondary | ICD-10-CM

## 2018-02-10 DIAGNOSIS — M069 Rheumatoid arthritis, unspecified: Secondary | ICD-10-CM | POA: Diagnosis not present

## 2018-02-10 DIAGNOSIS — I5030 Unspecified diastolic (congestive) heart failure: Secondary | ICD-10-CM | POA: Diagnosis not present

## 2018-02-10 DIAGNOSIS — E063 Autoimmune thyroiditis: Secondary | ICD-10-CM | POA: Diagnosis not present

## 2018-02-10 DIAGNOSIS — E039 Hypothyroidism, unspecified: Secondary | ICD-10-CM | POA: Diagnosis not present

## 2018-02-10 DIAGNOSIS — Z79890 Hormone replacement therapy (postmenopausal): Secondary | ICD-10-CM | POA: Diagnosis not present

## 2018-02-10 DIAGNOSIS — M62838 Other muscle spasm: Secondary | ICD-10-CM

## 2018-02-10 DIAGNOSIS — I5033 Acute on chronic diastolic (congestive) heart failure: Secondary | ICD-10-CM

## 2018-02-10 DIAGNOSIS — E038 Other specified hypothyroidism: Secondary | ICD-10-CM | POA: Diagnosis not present

## 2018-02-10 DIAGNOSIS — R011 Cardiac murmur, unspecified: Secondary | ICD-10-CM

## 2018-02-10 DIAGNOSIS — I110 Hypertensive heart disease with heart failure: Secondary | ICD-10-CM | POA: Diagnosis not present

## 2018-02-10 DIAGNOSIS — I872 Venous insufficiency (chronic) (peripheral): Secondary | ICD-10-CM

## 2018-02-10 DIAGNOSIS — Z79891 Long term (current) use of opiate analgesic: Secondary | ICD-10-CM | POA: Diagnosis not present

## 2018-02-10 DIAGNOSIS — I10 Essential (primary) hypertension: Secondary | ICD-10-CM

## 2018-02-10 LAB — BRAIN NATRIURETIC PEPTIDE: B NATRIURETIC PEPTIDE 5: 114.5 pg/mL — AB (ref 0.0–100.0)

## 2018-02-10 MED ORDER — TRAMADOL HCL 50 MG PO TABS: 50.0000 mg | ORAL_TABLET | Freq: Three times a day (TID) | ORAL | 0 refills | Status: DC | PRN

## 2018-02-10 MED ORDER — FUROSEMIDE 40 MG PO TABS
ORAL_TABLET | ORAL | 0 refills | Status: DC
Start: 2018-02-10 — End: 2018-02-13

## 2018-02-10 NOTE — Assessment & Plan Note (Addendum)
She is doing OK on current regimen of Tramadol 50mg  TID PRN and usually only takes it twice daily. She does note several days a few weeks ago when her joint pain was particularly bad due to weather changes and took extra. She feels with the improved weather coming up that she should be OK on this dose. She is not interested in trying another agent currently and has reasonable goals for pain control.  Review of [place] controlled substance database was appropriate and is not receiving controlled substances from any other provider. UDS obtained last visit appropriate. She was given Rx for Valium for muscle spasms and took one in the days leading up to our visit.  -Refill given for Tramadol 50mg  TID PRN #75 refill 0.  -Address pain control at follow-up visit, could consider increasing dose of Tramadol.

## 2018-02-10 NOTE — Patient Instructions (Addendum)
It was great seeing you today, [name]. I'm glad you are doing well.   I am concerned about your excess fluid and low oxygen when you walked in clinic. You do have history of heart failure seen on ECHO in July but this was mild. I am going to do several things today: -Take Lasix 40mg  twice daily for 3 days -Follow-up in [facility] on Friday (or Thursday) to check your volume status and your kidney numbers -I am ordering a chest x-ray and also a repeat ECHO.   Today we talked about your chronic pain. I think we should continue current dosing for now and adjust at our next visit if needed.  Thank you for taking your medication appropriately.   I am going to check your thyroid levels to make sure we don't need to adjust your medications.   PLEASE FOLLOW-UP IN [facility] IN 3 DAYS TO CHECK YOUR VOLUME STATUS AND KIDNEY NUMBERS. If your breathing gets worse or if you are unable to make that appointment, please call the clinic!

## 2018-02-10 NOTE — Assessment & Plan Note (Signed)
Most recent TSH 0.03. Currently on Synthroid daily. She tells me she is compliant although does have history of medication non-compliance. Will check TSH today and adjust dose as indicated.

## 2018-02-10 NOTE — Assessment & Plan Note (Addendum)
Patient found to be hypoxic to 88% with ambulation in clinic but improved at rest. No complaints although she has been a little more short of breath with activity lately and has noticed some worsened leg swelling over the past 2 weeks. She denies any significant changes in her diet or fluid intake. Denies any PND or orthopnea. She has never been told she has heart failure although says she was on Lasix at some point.   She has grade 1 diastolic dysfunction on ECHO 06/2017 obtained as part of peri-op evaluation. Patients 10 lb weight gain over 1 month, gross LE edema and faint bibasilar crackles are concerning for acute on chronic CHF. She was previously on "a water pill" but was stopped several years ago. Unsure of etiology of current exacerbation. Did have URI symptoms a few weeks ago. She denies any CP.  -Lasix 40mg  PO BID x3 days with close follow-up in [facility] this Friday -Adjust lasix dose at that visit and obtain BMET -Patient instructed to call clinic should symptoms worsen and also to reschedule appointment if unable to attend -CXR, ECHO, BNP -BMET -Will need to work on optimizing medical therapy going forward including continuation of ACE-I and addition of BB if HFpEF. This could be started at follow-up visit should her exacerbation resolve.

## 2018-02-10 NOTE — Assessment & Plan Note (Signed)
States compliance with Lisinopril 10mg  daily. Hypertensive here during visit and suspect related to volume overload. Will not adjust medication at this time although could increase Lisinopril at next visit vs add BB if HF exacerbation resolved.

## 2018-02-11 LAB — BMP8+ANION GAP
ANION GAP: 14 mmol/L (ref 10.0–18.0)
BUN/Creatinine Ratio: 23 (ref 12–28)
BUN: 18 mg/dL (ref 8–27)
CALCIUM: 8.8 mg/dL (ref 8.7–10.3)
CO2: 26 mmol/L (ref 20–29)
Chloride: 105 mmol/L (ref 96–106)
Creatinine, Ser: 0.79 mg/dL (ref 0.57–1.00)
GFR, EST AFRICAN AMERICAN: 94 mL/min/{1.73_m2} (ref >59)
GFR, EST NON AFRICAN AMERICAN: 82 mL/min/{1.73_m2} (ref >59)
Glucose: 76 mg/dL (ref 65–99)
Potassium: 4.3 mmol/L (ref 3.5–5.2)
Sodium: 145 mmol/L — ABNORMAL HIGH (ref 134–144)

## 2018-02-11 LAB — TSH: TSH: 1.66 u[IU]/mL (ref 0.450–4.500)

## 2018-02-11 NOTE — Progress Notes (Signed)
Internal Medicine Clinic Attending  Case discussed with Dr. Molt at the time of the visit.  We reviewed the resident's history and exam and pertinent patient test results.  I agree with the assessment, diagnosis, and plan of care documented in the resident's note. 

## 2018-02-13 ENCOUNTER — Encounter: Payer: Self-pay | Admitting: Internal Medicine

## 2018-02-13 ENCOUNTER — Ambulatory Visit: Payer: BLUE CROSS/BLUE SHIELD | Admitting: Internal Medicine

## 2018-02-13 VITALS — BP 142/85 | HR 90 | Temp 98.6°F | Wt 233.8 lb

## 2018-02-13 DIAGNOSIS — I110 Hypertensive heart disease with heart failure: Secondary | ICD-10-CM | POA: Diagnosis not present

## 2018-02-13 DIAGNOSIS — Z79899 Other long term (current) drug therapy: Secondary | ICD-10-CM

## 2018-02-13 DIAGNOSIS — I878 Other specified disorders of veins: Secondary | ICD-10-CM

## 2018-02-13 DIAGNOSIS — I5032 Chronic diastolic (congestive) heart failure: Secondary | ICD-10-CM | POA: Diagnosis not present

## 2018-02-13 DIAGNOSIS — I10 Essential (primary) hypertension: Secondary | ICD-10-CM

## 2018-02-13 DIAGNOSIS — I5030 Unspecified diastolic (congestive) heart failure: Secondary | ICD-10-CM | POA: Diagnosis not present

## 2018-02-13 MED ORDER — FUROSEMIDE 20 MG PO TABS: 20.0000 mg | ORAL_TABLET | Freq: Every day | ORAL | 2 refills | Status: DC

## 2018-02-13 NOTE — Assessment & Plan Note (Signed)
BP improved today at 142/85 on first check (from  following 3 days of Lasix 40mg  BID. Patient is not yet at her dry weight. Will recheck BP at follow up after continued diuresis. - Lasix 20mg  Daily - Titrate ACE-I up if BP remains elevated when patient is euvolemic

## 2018-02-13 NOTE — Patient Instructions (Addendum)
Thank you for allowing [place] to care for you  For your heart failure: - Please start taking Lasix 20mg  Daily - Please check you weight daily and keep a log to bring to your follow up visit - We will recheck your blood pressure at your follow up visit as more fluid comes off to see if it improves.  Please follow up with your PCP in about 1 month   Heart Failure Exacerbation  Heart failure is a condition in which the heart does not fill up with enough blood, and therefore does not pump enough blood and oxygen to the body. When this happens, parts of the body do not get the blood and oxygen they need to function properly. This can cause symptoms such as breathing problems, fatigue, swelling, and confusion. Heart failure exacerbation refers to heart failure symptoms that get worse. The symptoms may get worse suddenly or develop slowly over time. Heart failure exacerbation is a serious medical problem that should be treated right away. What are the causes? A heart failure exacerbation can be triggered by:  Not taking your heart failure medicines correctly.  Infections.  Eating an unhealthy diet or a diet that is high in salt (sodium).  Drinking too much fluid.  Drinking alcohol.  Taking illegal drugs, such as cocaine or methamphetamine.  Not exercising.  Other causes include:  Other heart conditions such as an irregular heartbeat (arrhythmia).  Anemia.  Other medical problems, such as kidney failure.  Sometimes the cause of the exacerbation is not known. What are the signs or symptoms? When heart failure symptoms suddenly or slowly get worse, this may be a sign of heart failure exacerbation. Symptoms of heart failure include:  Breathing problems or shortness of breath.  Chronic coughing or wheezing.  Fatigue.  Nausea or lack of appetite.  Feeling light-headed.  Confusion or memory loss.  Increased heart rate or irregular heartbeat.  Buildup of fluid in the legs,  ankles, feet, or abdomen.  Difficulty breathing when lying down.  How is this diagnosed? This condition is diagnosed based on:  Your symptoms and medical history.  A physical exam.  You may also have tests, including:  Electrocardiogram (ECG). This test measures the electrical activity of your heart.  Echocardiogram. This test uses sound waves to take a picture of your heart to see how well it works.  Blood tests.  Imaging tests, such as: ? Chest X-ray. ? MRI. ? Ultrasound.  Stress test. This test examines how well your heart functions when you exercise. Your heart is monitored while you exercise on a treadmill or exercise bike. If you cannot exercise, medicines may be used to increase your heartbeat in place of exercise.  Cardiac catheterization. During this test, a thin, flexible tube (catheter) is inserted into a blood vessel and threaded up to your heart. This test allows your health care provider to check the arteries that lead to your heart (coronary arteries).  Right heart catheterization. During this test, the pressure in your heart is measured.  How is this treated? This condition may be treated by:  Adjusting your heart medicines.  Maintaining a healthy lifestyle. This includes: ? Eating a heart-healthy diet that is low in sodium. ? Not using any products that contain nicotine or tobacco, such as cigarettes and e-cigarettes. ? Regular exercise. ? Monitoring your fluid intake. ? Monitoring your weight and reporting changes to your health care provider.  Treating sleep apnea, if you have this condition.  Surgery. This may include: ?  Implanting a device that helps both sides of your heart contract at the same time (cardiac resynchronization therapy device). This can help with heart function and relieve heart failure symptoms. ? Implanting a device that can correct heart rhythm problems (implantable cardioverter defibrillator). ? Connecting a device to your heart  to help it pump blood (ventricular assist device). ? Heart transplant.  Follow these instructions at home: Medicines  Take over-the-counter and prescription medicines only as told by your health care provider.  Do not stop taking your medicines or change the amount you take. If you are having problems or side effects from your medicines, talk to your health care provider.  If you are having difficulty paying for your medicines, contact a social worker or your clinic. There are many programs to assist with medicine costs.  Talk to your health care provider before starting any new medicines or supplements.  Make sure your health care provider and pharmacist have a list of all the medicines you are taking. Eating and drinking  Avoid drinking alcohol.  Eat a heart-healthy diet as told by your health care provider. This includes: ? Plenty of fruits and vegetables. ? Lean proteins. ? Low-fat dairy. ? Whole grains. ? Foods that are low in sodium. Activity  Exercise regularly as told by your health care provider. Balance exercise with rest.  Ask your health care provider what activities are safe for you. This includes sexual activity, exercise, and daily tasks at home or work. Lifestyle  Do not use any products that contain nicotine or tobacco, such as cigarettes and e-cigarettes. If you need help quitting, ask your health care provider.  Maintain a healthy weight. Ask your health care provider what weight is healthy for you.  Consider joining a patient support group. This can help with emotional problems you may have, such as stress and anxiety. General instructions  Talk to your health care provider about flu and pneumonia vaccines.  Keep a list of medicines that you are taking. This may help in emergency situations.  Keep all follow-up visits as told by your health care provider. This is important. Contact a health care provider if:  You have questions about your medicines or  you miss a dose.  You feel anxious, depressed, or stressed.  You have swelling in your feet, ankles, legs, or abdomen.  You have shortness of breath during activity or exercise.  You have a cough.  You have a fever.  You have trouble sleeping.  You gain 2-3 lb (1-1.4 kg) in 24 hours or 5 lb (2.3 kg) in a week. Get help right away if:  You have chest pain.  You have shortness of breath while resting.  You have severe fatigue.  You are confused.  You have severe dizziness.  You have a rapid or irregular heartbeat.  You have nausea or you vomit.  You have a cough that is worse at night or you cannot lie flat.  You have a cough that will not go away.  You have severe depression or sadness. Summary  When heart failure symptoms get worse, it is called heart failure exacerbation.  Common causes of this condition include taking medicines incorrectly, infections, and drinking alcohol.  This condition may be treated by adjusting medicines, maintaining a healthy lifestyle, or surgery.  Do not stop taking your medicines or change the amount you take. If you are having problems or side effects from your medicines, talk to your health care provider. This information is not intended  to replace advice given to you by your health care provider. Make sure you discuss any questions you have with your health care provider. Document Released: 04/15/2017 Document Revised: 04/15/2017 Document Reviewed: 04/15/2017 Elsevier Interactive Patient Education  2018 [employer].

## 2018-02-13 NOTE — Assessment & Plan Note (Addendum)
Patient presents for close follow up after being seen 3 days ago with mild heart failure exacerbation. She was hypoxic to 88% with ambulation at that time and endorsed increased shortness of breath and leg swelling. She had not been experiencing any orthopnea. At that visit she stated that she had never been told she had heart failure, but had taken lasix before and echo from 2018 showed G1DD.   Today, patient has lost 10lbs on 40mg  BID x3 days, she is no longer short of breath, and her edema has improved.Patient did not get CXR that was ordered, but is scheduled for repeat ECHO on 02/17/18. BP improved with diureses; remains somewhat elevated but patient is not yet euvolemic. - Lasix 20mg  Daily - Repeat BMP - Titrate ACE-I up if BP remains elevated when patient is euvolemic - BB initial could be considered though this does not have proven benefit in HFpEF - Patient instruct to try to check her weight daily and keep a long to assist in determining her dry weight - Instructions given for recurrence of heart failure symptoms  ADDENDUM BMP showed elevated Cr 1.39. Contacted patient with result. She was informed that her Cr elevation is consistent with acute kidney injury following her higher dose diuretic use to treat her  mild heart failure exacerbation. She was instructed make a follow up appointment in the next 1-2 weeks to evaluate for resolution of AKI.

## 2018-02-13 NOTE — Progress Notes (Signed)
   CC: Diastolic Heart Failure, Hypertension  HPI:  [name] is a 62 y.o. F with PMHx listed below presenting for Diastolic Heart Failure, Hypertension. Please see the A&P for the status of the patient's chronic medical problems.   Past Medical History:  Diagnosis Date  . Arthritis   . Dyspnea    W/ EXERTION, +  HUMIDITY   . Emphysema lung (HCC)   . GERD (gastroesophageal reflux disease)   . Hashimoto's thyroiditis   . Heart murmur   . 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 HTN (hypertension)   . Hypercholesteremia   . Hypothyroid   . Rheumatoid arthritis(714.0)   . Varicose vein of leg    Review of Systems: Performed and all others negative.  Physical Exam:  Vitals:   02/13/18 1421  BP: (!) 142/85  Pulse: 90  Temp: 98.6 F (37 C)  TempSrc: Oral  SpO2: 95%  Weight: 233 lb 12.8 oz (106.1 kg)   Physical Exam  Constitutional: She appears well-developed and well-nourished. No distress.  HENT:  Head: Normocephalic and atraumatic.  Eyes: EOM are normal. Right eye exhibits no discharge. Left eye exhibits no discharge.  Cardiovascular: Normal rate, regular rhythm, normal heart sounds and intact distal pulses.  Pulmonary/Chest: Effort normal. No respiratory distress.  Mild bibasilar crackles  Abdominal: Soft. Bowel sounds are normal. She exhibits no distension. There is no tenderness.  Musculoskeletal: She exhibits edema. She exhibits no deformity.  1-2+ Edema Bilaterally Venous stasis changes  Skin: Skin is warm and dry.    Assessment & Plan:   See Encounters Tab for problem based charting.  Patient discussed with Dr. Rogelia Boga

## 2018-02-14 LAB — BMP8+ANION GAP
Anion Gap: 18 mmol/L (ref 10.0–18.0)
BUN / CREAT RATIO: 14 (ref 12–28)
BUN: 19 mg/dL (ref 8–27)
CO2: 26 mmol/L (ref 20–29)
Calcium: 9.7 mg/dL (ref 8.7–10.3)
Chloride: 97 mmol/L (ref 96–106)
Creatinine, Ser: 1.39 mg/dL — ABNORMAL HIGH (ref 0.57–1.00)
GFR calc non Af Amer: 41 mL/min/{1.73_m2} — ABNORMAL LOW (ref >59)
GFR, EST AFRICAN AMERICAN: 48 mL/min/{1.73_m2} — AB (ref >59)
Glucose: 82 mg/dL (ref 65–99)
Potassium: 3.5 mmol/L (ref 3.5–5.2)
SODIUM: 141 mmol/L (ref 134–144)

## 2018-02-16 ENCOUNTER — Telehealth: Payer: Self-pay | Admitting: Internal Medicine

## 2018-02-16 NOTE — Telephone Encounter (Signed)
Contacted patient with result of recent BMP. She was informed that her Cr was elevated and that this is consistent with acute kidney injury following her higher dose diuretic use to treat her recent mild heart failure exacerbation . She was instructed make a follow up appointment in the next 1-2 weeks to evaluate for resolution of AKI.  Ginette Otto, DO IM PGY-1

## 2018-02-17 ENCOUNTER — Ambulatory Visit (HOSPITAL_COMMUNITY)
Admission: RE | Admit: 2018-02-17 | Discharge: 2018-02-17 | Disposition: A | Discharge status: Home / Self Care | Payer: BLUE CROSS/BLUE SHIELD | Source: Ambulatory Visit | Attending: Student in an Organized Health Care Education/Training Program | Admitting: Student in an Organized Health Care Education/Training Program

## 2018-02-17 DIAGNOSIS — I110 Hypertensive heart disease with heart failure: Secondary | ICD-10-CM | POA: Diagnosis not present

## 2018-02-17 DIAGNOSIS — I340 Nonrheumatic mitral (valve) insufficiency: Secondary | ICD-10-CM | POA: Diagnosis not present

## 2018-02-17 DIAGNOSIS — Z720 Tobacco use: Secondary | ICD-10-CM | POA: Insufficient documentation

## 2018-02-17 DIAGNOSIS — I5033 Acute on chronic diastolic (congestive) heart failure: Secondary | ICD-10-CM | POA: Diagnosis not present

## 2018-02-17 NOTE — Progress Notes (Signed)
Internal Medicine Clinic Attending  Case discussed with Dr. Melvin  at the time of the visit.  We reviewed the resident's history and exam and pertinent patient test results.  I agree with the assessment, diagnosis, and plan of care documented in the resident's note.  

## 2018-02-17 NOTE — Progress Notes (Signed)
  Echocardiogram 2D Echocardiogram has been performed.  [name] 02/17/2018, 2:04 PM

## 2018-03-10 ENCOUNTER — Ambulatory Visit: Payer: BLUE CROSS/BLUE SHIELD | Admitting: Internal Medicine

## 2018-03-10 ENCOUNTER — Other Ambulatory Visit: Payer: Self-pay

## 2018-03-10 ENCOUNTER — Encounter: Payer: Self-pay | Admitting: Internal Medicine

## 2018-03-10 VITALS — BP 132/74 | HR 85 | Temp 98.3°F | Ht 62.0 in | Wt 230.8 lb

## 2018-03-10 DIAGNOSIS — I110 Hypertensive heart disease with heart failure: Secondary | ICD-10-CM | POA: Diagnosis not present

## 2018-03-10 DIAGNOSIS — M069 Rheumatoid arthritis, unspecified: Secondary | ICD-10-CM

## 2018-03-10 DIAGNOSIS — I5032 Chronic diastolic (congestive) heart failure: Secondary | ICD-10-CM

## 2018-03-10 DIAGNOSIS — I10 Essential (primary) hypertension: Secondary | ICD-10-CM

## 2018-03-10 DIAGNOSIS — M170 Bilateral primary osteoarthritis of knee: Secondary | ICD-10-CM

## 2018-03-10 DIAGNOSIS — G8929 Other chronic pain: Secondary | ICD-10-CM | POA: Diagnosis not present

## 2018-03-10 DIAGNOSIS — E039 Hypothyroidism, unspecified: Secondary | ICD-10-CM | POA: Diagnosis not present

## 2018-03-10 MED ORDER — HYDROCODONE-ACETAMINOPHEN 5-325 MG PO TABS: 1.0000 | ORAL_TABLET | Freq: Two times a day (BID) | ORAL | 0 refills | Status: DC | PRN

## 2018-03-10 NOTE — Assessment & Plan Note (Signed)
Recent HF exacerbation managed outpatient with lasix 40mg  BID x3 days. Seen on the 4th day with 10 lb wt loss but still with persistent bibasilar crackles and peripheral edema. BMET with AKI, but lasix 20mg  daily was continued. She notes improvement of her LE swelling, but still present. Her DOE has improved, but unable to quantify distance shes able to ambulate due to uncontrolled chronic BL knee pain.  ECHO obtained 3/5 showed preserved EF but moderate LVH, severely calcified mitral annulus and leaflets with mild regurgitation. She was seen in by cardiology just about a year ago for pre-op clearance, however given her valvular disease, she may benefit from return visit.  We discussed many aspects of CHF management today including sodium restriction and optimizing BP. She admits to a very high sodium diet and is worried she may have trouble with this component. We discussed sodium in moderation and alternatives to added salt.  -Patient to see cardiology re: dCHF and MV disease -BMET to evaluate renal function -Continue Lasix 20mg  daily for now, may adjust based on renal function -RF optimization including improved BP control

## 2018-03-10 NOTE — Progress Notes (Signed)
   CC: follow-up CHF, chronic pain  HPI:  [name] is a 62 y.o. F with tricompartmental OA of BL knees on pain contract with [place], diastolic CHF, RhA, hypothyroidism and hypertension here for follow-up of the above conditions. Please see problem-based charting for detail of her chronic medical conditions.   Past Medical History:  Diagnosis Date  . Arthritis   . Dyspnea    W/ EXERTION, +  HUMIDITY   . Emphysema lung (HCC)   . GERD (gastroesophageal reflux disease)   . Hashimoto's thyroiditis   . Heart murmur   . 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 HTN (hypertension)   . Hypercholesteremia   . Hypothyroid   . Rheumatoid arthritis(714.0)   . Varicose vein of leg    Review of Systems:   General: +Fatigue. Denies fevers, chills, weight loss HEENT: Denies changes in vision, dysphagia Cardiac: +DOE. Denies CP, palpitations, orthopnea Pulmonary: Denies cough, wheezes, PND Abd: Denies diarrhea, constipation or changes in bowels Extremities: +BL knee pain. +Leg swelling.   Physical Exam: General: Alert, in no acute distress. Pleasant and conversant. HEENT: No icterus, injection or ptosis. No hoarseness or dysarthria. No JVD appreciated. Cardiac: RRR, intact distal pulses Pulmonary: CTA BL. Normal WOB on RA.  Abd: Soft, non-tender. +bs Extremities: Warm, perfused. Trace to 1+ pedal edema to shins BL. Skin changes of chronic venous insufficiency. Obvious RhA changes to BL hands, L>R. Not acutely swollen.  Vitals:   03/10/18 1434 03/10/18 1510  BP: (!) 146/79 132/74  Pulse: 85   Temp: 98.3 F (36.8 C)   TempSrc: Oral   SpO2: 100%   Weight: 230 lb 12.8 oz (104.7 kg)   Height: 5\' 2"  (1.575 m)    Assessment & Plan:   See Encounters Tab for problem based charting.  Patient discussed with Dr. 

## 2018-03-10 NOTE — Patient Instructions (Signed)
It was great seeing you today!! I'm glad youre doing OK.   Today we talked about your heart failure. I'm glad you are feeling better but you do still have some extra fluid to go. Please remember what we talked about in regards to your sodium and fluid intake. Try to limit the amount of sodium you take to less than 1.5g per day. I do want you to be seen by a cardiologist for further recommendations.   Today we talked about your pain control. I'm sorry the Tramadol is not allowing you to complete our goals. Lets try Norco and see if that is any better. Please take 1 pill as needed for pain, up to 2 a day. See me back in 1 month to see how this is going.   I do need to check your kidney levels today, so please stop by the lab!

## 2018-03-10 NOTE — Assessment & Plan Note (Signed)
Last saw her rheumatologist 10/18. Has follow-up appointment here within the next few weeks. Has been compliant with her Humera Q14 days as well as her weekly methotrexate. No new complaints related to her RhA.

## 2018-03-10 NOTE — Assessment & Plan Note (Addendum)
BP elevated at 146/85 with pulse 85 today but improved on repeat to 132/74. She is compliant with her Lisinopril 5mg  daily and denies cough. She will need her BP optimized going forward given her diagnosis of CHF, however will wait until BMET/renal function results to make any changes to her current regimen.  -Continue Lisinopril 5mg  at current dose for now -Follow-up BMET, adjust BP medication as renal function allows

## 2018-03-10 NOTE — Assessment & Plan Note (Addendum)
Patient was not meeting her functionality goals at our last visit, but wanted to give Tramadol another month to see if better weather would allow for sufficient pain management on current regimen. She notes no improvement and expresses desire to be able to clean her house without taking frequent breaks due to pain or to go grocery shopping without using an electric shopping cart because she is unable to walk down isles without having to stop for pain.  We discussed her experiences with other pain regimens. She notes intolerance to oxycodone (nausea/vomiting) and did not like feeling "spaced-out." She notes good results from hydrocodone in the past, and is open to trial of this medication over the next month. -Reviewed [place] PMP Aware/[place] database, appropriate. She has not filled controlled substances from any other provider -Recent UDS appropriate, will repeat at next visit -Trial of Norco 5-325mg  tabs BID PRN severe pain, #60, Refill 0 -She has appropriate goals and expectations, will follow her response to medication change at our follow-up visit next month

## 2018-03-11 LAB — BMP8+ANION GAP
ANION GAP: 11 mmol/L (ref 10.0–18.0)
BUN/Creatinine Ratio: 29 — ABNORMAL HIGH (ref 12–28)
BUN: 22 mg/dL (ref 8–27)
CO2: 27 mmol/L (ref 20–29)
CREATININE: 0.77 mg/dL (ref 0.57–1.00)
Calcium: 8.7 mg/dL (ref 8.7–10.3)
Chloride: 105 mmol/L (ref 96–106)
GFR calc Af Amer: 97 mL/min/{1.73_m2} (ref >59)
GFR, EST NON AFRICAN AMERICAN: 84 mL/min/{1.73_m2} (ref >59)
Glucose: 85 mg/dL (ref 65–99)
Potassium: 4 mmol/L (ref 3.5–5.2)
Sodium: 143 mmol/L (ref 134–144)

## 2018-03-11 NOTE — Progress Notes (Signed)
Internal Medicine Clinic Attending  Case discussed with Dr. Molt at the time of the visit.  We reviewed the resident's history and exam and pertinent patient test results.  I agree with the assessment, diagnosis, and plan of care documented in the resident's note. 

## 2018-03-11 NOTE — Addendum Note (Signed)
Addended by: Erlinda Hong T on: 03/11/2018 10:21 AM   Modules accepted: Level of Service

## 2018-04-07 ENCOUNTER — Other Ambulatory Visit: Payer: Self-pay

## 2018-04-07 ENCOUNTER — Ambulatory Visit: Payer: BLUE CROSS/BLUE SHIELD | Admitting: Internal Medicine

## 2018-04-07 ENCOUNTER — Encounter: Payer: Self-pay | Admitting: Internal Medicine

## 2018-04-07 VITALS — BP 138/78 | HR 88 | Temp 98.2°F | Ht 62.0 in | Wt 224.7 lb

## 2018-04-07 DIAGNOSIS — R110 Nausea: Secondary | ICD-10-CM

## 2018-04-07 DIAGNOSIS — E039 Hypothyroidism, unspecified: Secondary | ICD-10-CM

## 2018-04-07 DIAGNOSIS — M170 Bilateral primary osteoarthritis of knee: Secondary | ICD-10-CM | POA: Diagnosis not present

## 2018-04-07 DIAGNOSIS — Z7952 Long term (current) use of systemic steroids: Secondary | ICD-10-CM

## 2018-04-07 DIAGNOSIS — Z79899 Other long term (current) drug therapy: Secondary | ICD-10-CM

## 2018-04-07 DIAGNOSIS — Z79891 Long term (current) use of opiate analgesic: Secondary | ICD-10-CM

## 2018-04-07 DIAGNOSIS — G8929 Other chronic pain: Secondary | ICD-10-CM | POA: Diagnosis not present

## 2018-04-07 DIAGNOSIS — I059 Rheumatic mitral valve disease, unspecified: Secondary | ICD-10-CM | POA: Insufficient documentation

## 2018-04-07 DIAGNOSIS — I5030 Unspecified diastolic (congestive) heart failure: Secondary | ICD-10-CM

## 2018-04-07 DIAGNOSIS — I2510 Atherosclerotic heart disease of native coronary artery without angina pectoris: Secondary | ICD-10-CM

## 2018-04-07 DIAGNOSIS — F0631 Mood disorder due to known physiological condition with depressive features: Secondary | ICD-10-CM | POA: Diagnosis not present

## 2018-04-07 DIAGNOSIS — F329 Major depressive disorder, single episode, unspecified: Secondary | ICD-10-CM | POA: Insufficient documentation

## 2018-04-07 DIAGNOSIS — Z9114 Patient's other noncompliance with medication regimen: Secondary | ICD-10-CM

## 2018-04-07 DIAGNOSIS — I3481 Nonrheumatic mitral (valve) annulus calcification: Secondary | ICD-10-CM | POA: Insufficient documentation

## 2018-04-07 DIAGNOSIS — R011 Cardiac murmur, unspecified: Secondary | ICD-10-CM

## 2018-04-07 DIAGNOSIS — M069 Rheumatoid arthritis, unspecified: Secondary | ICD-10-CM

## 2018-04-07 DIAGNOSIS — I5032 Chronic diastolic (congestive) heart failure: Secondary | ICD-10-CM

## 2018-04-07 DIAGNOSIS — F32A Depression, unspecified: Secondary | ICD-10-CM | POA: Insufficient documentation

## 2018-04-07 DIAGNOSIS — F3289 Other specified depressive episodes: Secondary | ICD-10-CM

## 2018-04-07 MED ORDER — METHOTREXATE SODIUM 7.5 MG PO TABS: 7.5000 mg | ORAL_TABLET | ORAL | 1 refills | Status: DC

## 2018-04-07 MED ORDER — PREDNISONE 5 MG PO TABS: 5.0000 mg | ORAL_TABLET | Freq: Every day | ORAL | 0 refills | Status: DC

## 2018-04-07 MED ORDER — TRAMADOL HCL 50 MG PO TABS: 50.0000 mg | ORAL_TABLET | Freq: Three times a day (TID) | ORAL | 0 refills | Status: AC | PRN

## 2018-04-07 MED ORDER — FOLIC ACID 1 MG PO TABS: 1.0000 mg | ORAL_TABLET | Freq: Every day | ORAL | 1 refills | Status: DC

## 2018-04-07 NOTE — Patient Instructions (Addendum)
It was great seeing you! I'm sorry that medication did not work for your pain. I'm glad you were able to get some relief with Tramadol, so let's put you back on that medication today. I've sent a refill to your pharmacy of Tramadol 50mg  to be taken up to three-times daily as needed for severe pain.   In addition, it might help if we get a better handle on your Rheumatoid Arthritis. I am restarting Methotrexate today at 7.5mg  once weekly.   Please stop by the lab on your way out so I can check a few labs related to starting methotrexate. In addition, I also need you to return to clinic in 2 weeks for repeat labs to make sure you are tolerating the medication.

## 2018-04-07 NOTE — Assessment & Plan Note (Signed)
Assessment: Euvolemic on examination today on lasix 20mg  daily. No PND, orthopnea or significant LE edema. Her ECHO demonstrated severe mitral valve annular calcification with mild MR, and she has an appointment 5/1 to discuss if this may be contributing to her HF or if further intervention/monitoring is appropriate.   Plan: No changes to current medications today. Appreciate cardiology recs at follow-up 5/1.

## 2018-04-07 NOTE — Assessment & Plan Note (Addendum)
Assessment: Notes minimal-to-no pain control with recent transition to Norco 5-325mg  BID prn. Additionally, she complains of feeling nauseated, "woozy" and very fatigued throughout the day. Reports similar side effects when taking Oxycodone in the past. She's frustrated given her lack of response to Norco, but realized she was mostly immobile while on it previously and slept through the majority of the pain. She continues to have realistic expectations in regards to pain control, stating "I just want to do my dishes or make a meal."  Plan: We discussed the difficult situation of moderate pain-relief with Tramadol and intolerance to hydrocodone and oxycodone. She mentions a trial of increased dose of Tramadol, however I'm unsure she would get much additional benefit as she was moderately controlled with Tramadol 50mg  TID prn. Discussed with patient, who's agreeable to another trial of Tramadol 50mg  TID prn. Instructed to take as sparingly as possible. -Reviewed [place] controlled substance database, appropriate. -Tramadol 50mg  TID prn, #90, Refill 0. -RTC 1 month for follow-up -Additionally, am addressing her incompletely treated Rheumatoid Arthritis (separate problem), which should help with her overall pain

## 2018-04-07 NOTE — Assessment & Plan Note (Addendum)
Assessment & Plan: Patient admits to feeling depressed with low mood and attributes this to her frustrations with uncontrolled chronic pain. She denies any SI. Her PHQ-9 today was 19 and we discussed possibility of starting SNRI, however will defer for now given reinitiating Tramadol and its potential for SNRI-like behavior.  -Hopeful that mood will improve with improved functionality and pain control

## 2018-04-07 NOTE — Assessment & Plan Note (Addendum)
Assessment: When evaluating her pain generators, she admits to non-compliance with Humera and MTX, although has been taking Prednisone daily. She notes Delbert Harness makes her feel "like she has the flu" for 10-14 days, just in time for another injection. She has an appointment with her rheumatologist in May, but reportedly wont refill MTX until seen in their office/labs obtained. She understands that her uncontrolled RhA could be related to her current degree of pain and is agreeable to resuming MTX.  Plan: Will restart patients Methotrexate today at 7.5mg  Q-weekly. Have managed this before for patient while bridging until rheumatology referral and she tolerated well without LFT or CBC abnormalities.  -MTX 7.5mg  Qweekly x4 weeks with 1 refill -Folic acid also sent to pharmacy -CBC, CMET today -RTC 2 weeks for repeat CBC and CMET to ensure stability on MTX, will be seen in [facility]

## 2018-04-07 NOTE — Progress Notes (Signed)
   CC: follow-up of chronic pain, rheumatoid arthritis.  HPI:  [name] is a 62 y.o. F with rheumatoid arthritis, severe osteoarthritis of BL knees, hypothyroidism, severe calcification of mitral annulus with history of HFpEF who presents today for follow-up of chronic pain.   For details regarding today's visit and the status of their chronic medical issues, please refer to the assessment and plan.  Past Medical History:  Diagnosis Date  . Arthritis   . Dyspnea    W/ EXERTION, +  HUMIDITY   . Emphysema lung (HCC)   . GERD (gastroesophageal reflux disease)   . Hashimoto's thyroiditis   . Heart murmur   . 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 HTN (hypertension)   . Hypercholesteremia   . Hypothyroid   . Rheumatoid arthritis(714.0)   . Varicose vein of leg    Review of Systems:   General: Admits to fatigue. Denies fevers, rashes HEENT: Denies acute changes in vision, dysphagia, headaches Cardiac: Denies CP, SOB, palpitations Pulmonary: Denies wheezes, PND Abd: Admits to nausea. Denies abdominal pain, changes in bowels Extremities: Admits to diffuse joint pain. Denies persistent swelling  Physical Exam: General: Alert, in no acute distress. In wheelchair. Pleasant and conversant, although low mood.  HEENT: No icterus, injection or ptosis. No hoarseness or dysarthria. Moist mucous membranes. No JVD appreciated. Cardiac: RRR. Faint murmur best heard RUSB. Pulmonary: CTA BL with normal WOB on RA. Able to speak in complete sentences Abd: Soft, non-tender. +bs Extremities: Warm, perfused. Trace edema to shins. Hands with obvious ulnar deviation, swan-neck & boutonierre deformities.  Psych: Depressed mood, frustrated. Responds to questions appropriately. No SI/HI expressed.  Vitals:   04/07/18 1515  BP: 138/78  Pulse: 88  Temp: 98.2 F (36.8 C)  TempSrc: Oral  SpO2: 95%  Weight: 224 lb 11.2 oz (101.9 kg)  Height: 5\' 2"  (1.575 m)   Body mass index is 41.1 kg/m.    Office  Visit from 04/07/2018 in [facility]  PHQ-9 Total Score  19     Assessment & Plan:   See Encounters Tab for problem based charting.  Patient discussed with Dr. [facility]

## 2018-04-08 LAB — CMP14 + ANION GAP
A/G RATIO: 1.4 (ref 1.2–2.2)
ALBUMIN: 3.9 g/dL (ref 3.6–4.8)
ALT: 10 IU/L (ref 0–32)
AST: 12 IU/L (ref 0–40)
Alkaline Phosphatase: 106 IU/L (ref 39–117)
Anion Gap: 12 mmol/L (ref 10.0–18.0)
BUN / CREAT RATIO: 26 (ref 12–28)
BUN: 25 mg/dL (ref 8–27)
Bilirubin Total: 0.3 mg/dL (ref 0.0–1.2)
CO2: 25 mmol/L (ref 20–29)
Calcium: 10 mg/dL (ref 8.7–10.3)
Chloride: 99 mmol/L (ref 96–106)
Creatinine, Ser: 0.96 mg/dL (ref 0.57–1.00)
GFR calc non Af Amer: 64 mL/min/{1.73_m2} (ref >59)
GFR, EST AFRICAN AMERICAN: 74 mL/min/{1.73_m2} (ref >59)
GLOBULIN, TOTAL: 2.8 g/dL (ref 1.5–4.5)
Glucose: 78 mg/dL (ref 65–99)
POTASSIUM: 5.1 mmol/L (ref 3.5–5.2)
SODIUM: 136 mmol/L (ref 134–144)
Total Protein: 6.7 g/dL (ref 6.0–8.5)

## 2018-04-08 LAB — CBC WITH DIFFERENTIAL/PLATELET
BASOS ABS: 0 10*3/uL (ref 0.0–0.2)
Basos: 0 %
EOS (ABSOLUTE): 0.1 10*3/uL (ref 0.0–0.4)
Eos: 1 %
HEMOGLOBIN: 13.7 g/dL (ref 11.1–15.9)
Hematocrit: 43.1 % (ref 34.0–46.6)
IMMATURE GRANS (ABS): 0.1 10*3/uL (ref 0.0–0.1)
Immature Granulocytes: 1 %
LYMPHS: 16 %
Lymphocytes Absolute: 1.4 10*3/uL (ref 0.7–3.1)
MCH: 29.5 pg (ref 26.6–33.0)
MCHC: 31.8 g/dL (ref 31.5–35.7)
MCV: 93 fL (ref 79–97)
MONOCYTES: 8 %
Monocytes Absolute: 0.7 10*3/uL (ref 0.1–0.9)
NEUTROS ABS: 6.4 10*3/uL (ref 1.4–7.0)
Neutrophils: 74 %
Platelets: 193 10*3/uL (ref 150–379)
RBC: 4.64 x10E6/uL (ref 3.77–5.28)
RDW: 14.8 % (ref 12.3–15.4)
WBC: 8.7 10*3/uL (ref 3.4–10.8)

## 2018-04-08 NOTE — Progress Notes (Signed)
Internal Medicine Clinic Attending  Case discussed with Dr. Molt at the time of the visit.  We reviewed the resident's history and exam and pertinent patient test results.  I agree with the assessment, diagnosis, and plan of care documented in the resident's note. 

## 2018-04-15 ENCOUNTER — Ambulatory Visit: Payer: BLUE CROSS/BLUE SHIELD | Admitting: Cardiology

## 2018-04-16 ENCOUNTER — Encounter: Payer: Self-pay | Admitting: *Deleted

## 2018-04-20 NOTE — Addendum Note (Signed)
Addended by: Neomia Dear on: 04/20/2018 08:05 PM   Modules accepted: Orders

## 2018-04-21 ENCOUNTER — Other Ambulatory Visit: Payer: Self-pay

## 2018-04-21 ENCOUNTER — Ambulatory Visit: Payer: BLUE CROSS/BLUE SHIELD | Admitting: Internal Medicine

## 2018-04-21 ENCOUNTER — Encounter: Payer: Self-pay | Admitting: Internal Medicine

## 2018-04-21 VITALS — BP 141/84 | HR 73 | Temp 98.2°F | Wt 225.5 lb

## 2018-04-21 DIAGNOSIS — M069 Rheumatoid arthritis, unspecified: Secondary | ICD-10-CM

## 2018-04-21 DIAGNOSIS — Z79899 Other long term (current) drug therapy: Secondary | ICD-10-CM | POA: Diagnosis not present

## 2018-04-21 DIAGNOSIS — E7800 Pure hypercholesterolemia, unspecified: Secondary | ICD-10-CM

## 2018-04-21 MED ORDER — SIMVASTATIN 20 MG PO TABS: 20.0000 mg | ORAL_TABLET | Freq: Every day | ORAL | 1 refills | Status: DC

## 2018-04-21 NOTE — Progress Notes (Signed)
   CC: Rheumatoid arthritis   HPI:[name] is a 62 y.o. female who presents today for evaluation of her RA and recently started methotrexate.   RA: Patient recently seen and evaluated in the clinic for her RA. She was started on MTX with baseline labs recorded and advised to appropriately return in two weeks for repeat testing of her hepatic, renal and hematologic function. Given her unremarkable physical exam and ROS with no notable abnormal lab values demonstrated other than a slightly increased BUN to 33, we will continue the MTX and have the patient return in four weeks. CMP with Cr of 0.92, Na 138 and K+ 4.9, AST/ALT 13/9. CBC with Hgb of 13.2, WBC 8.4.    Refilled Simvastatin for HLD   Past Medical History:  Diagnosis Date  . Arthritis   . Dyspnea    W/ EXERTION, +  HUMIDITY   . Emphysema lung (HCC)   . GERD (gastroesophageal reflux disease)   . Hashimoto's thyroiditis   . Heart murmur   . 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 HTN (hypertension)   . Hypercholesteremia   . Hypothyroid   . Rheumatoid arthritis(714.0)   . Varicose vein of leg    Review of Systems:   Review of Systems  Constitutional: Negative for chills, diaphoresis and fever.  HENT: Negative for ear pain and sinus pain.   Eyes: Negative for blurred vision, photophobia and redness.  Respiratory: Negative for cough and shortness of breath.   Cardiovascular: Negative for chest pain and leg swelling.  Gastrointestinal: Negative for constipation, diarrhea, nausea and vomiting.  Genitourinary: Negative for flank pain and urgency.  Musculoskeletal: Negative for myalgias.  Neurological: Negative for dizziness and headaches.  Psychiatric/Behavioral: Negative for depression. The patient is not nervous/anxious.    Physical Exam:  Vitals:   04/21/18 1109  BP: (!) 141/84  Pulse: 73  Temp: 98.2 F (36.8 C)  TempSrc: Oral  SpO2: 98%  Weight: 225 lb 8 oz (102.3 kg)   Physical Exam  Constitutional: She appears  well-developed and well-nourished. No distress.  HENT:  Head: Normocephalic and atraumatic.  Eyes: Conjunctivae are normal.  Cardiovascular: Normal rate and regular rhythm.  No murmur heard. Pulmonary/Chest: Effort normal and breath sounds normal. No stridor. No respiratory distress.  Abdominal: Soft. Bowel sounds are normal. She exhibits no distension. There is no tenderness.  Musculoskeletal: She exhibits no edema or tenderness.  Skin: Skin is warm. She is not diaphoretic.  Psychiatric: She has a normal mood and affect.  Vitals reviewed.  Assessment & Plan:   See Encounters Tab for problem based charting.  Patient discussed with Dr. Cleda Daub

## 2018-04-21 NOTE — Patient Instructions (Addendum)
FOLLOW-UP INSTRUCTIONS When: In four weeks  For: for a CBC and CMET What to bring: yourself  Please schedule an appointment in 4 weeks with the [facility].  Continue the methotrexate weekly dosing as previously prescribed.  We will call you with results of CMP and CBC if there is a concerning abnormality.  Thank you for your visit to the [facility] today.

## 2018-04-22 LAB — CMP14 + ANION GAP
ALBUMIN: 3.8 g/dL (ref 3.6–4.8)
ALK PHOS: 115 IU/L (ref 39–117)
ALT: 9 IU/L (ref 0–32)
ANION GAP: 11 mmol/L (ref 10.0–18.0)
AST: 13 IU/L (ref 0–40)
Albumin/Globulin Ratio: 1.4 (ref 1.2–2.2)
BUN/Creatinine Ratio: 33 — ABNORMAL HIGH (ref 12–28)
BUN: 30 mg/dL — ABNORMAL HIGH (ref 8–27)
Bilirubin Total: <0.2 mg/dL (ref 0.0–1.2)
CO2: 24 mmol/L (ref 20–29)
CREATININE: 0.92 mg/dL (ref 0.57–1.00)
Calcium: 9.8 mg/dL (ref 8.7–10.3)
Chloride: 103 mmol/L (ref 96–106)
GFR calc Af Amer: 78 mL/min/{1.73_m2} (ref >59)
GFR calc non Af Amer: 68 mL/min/{1.73_m2} (ref >59)
Globulin, Total: 2.8 g/dL (ref 1.5–4.5)
Glucose: 81 mg/dL (ref 65–99)
Potassium: 4.9 mmol/L (ref 3.5–5.2)
Sodium: 138 mmol/L (ref 134–144)
Total Protein: 6.6 g/dL (ref 6.0–8.5)

## 2018-04-22 LAB — CBC WITH DIFFERENTIAL/PLATELET
Basophils Absolute: 0 10*3/uL (ref 0.0–0.2)
Basos: 0 %
EOS (ABSOLUTE): 0.1 10*3/uL (ref 0.0–0.4)
EOS: 1 %
HEMATOCRIT: 41 % (ref 34.0–46.6)
HEMOGLOBIN: 13.2 g/dL (ref 11.1–15.9)
IMMATURE GRANULOCYTES: 1 %
Immature Grans (Abs): 0.1 10*3/uL (ref 0.0–0.1)
Lymphocytes Absolute: 1.4 10*3/uL (ref 0.7–3.1)
Lymphs: 16 %
MCH: 29.9 pg (ref 26.6–33.0)
MCHC: 32.2 g/dL (ref 31.5–35.7)
MCV: 93 fL (ref 79–97)
MONOCYTES: 8 %
Monocytes Absolute: 0.7 10*3/uL (ref 0.1–0.9)
NEUTROS PCT: 74 %
Neutrophils Absolute: 6.2 10*3/uL (ref 1.4–7.0)
Platelets: 198 10*3/uL (ref 150–379)
RBC: 4.42 x10E6/uL (ref 3.77–5.28)
RDW: 14.8 % (ref 12.3–15.4)
WBC: 8.4 10*3/uL (ref 3.4–10.8)

## 2018-04-22 NOTE — Assessment & Plan Note (Addendum)
RA: Patient recently seen and evaluated in the clinic for her RA. She was started on MTX with baseline labs recorded and advised to appropriately return in two weeks for repeat testing of her hepatic, renal and hematologic function. Given her unremarkable physical exam and ROS with no notable abnormal lab values demonstrated other than a slightly increased BUN to 33, we will continue the MTX and have the patient return in four weeks. CMP with Cr of 0.92, Na 138 and K+ 4.9, AST/ALT 13/9. CBC with Hgb of 13.2, WBC 8.4.

## 2018-04-22 NOTE — Progress Notes (Signed)
Internal Medicine Clinic Attending  Case discussed with Dr. Harbrecht at the time of the visit.  We reviewed the resident's history and exam and pertinent patient test results.  I agree with the assessment, diagnosis, and plan of care documented in the resident's note.   

## 2018-04-28 ENCOUNTER — Other Ambulatory Visit: Payer: Self-pay | Admitting: *Deleted

## 2018-04-28 ENCOUNTER — Encounter: Payer: Self-pay | Admitting: Internal Medicine

## 2018-04-28 MED ORDER — LISINOPRIL 10 MG PO TABS: 5.0000 mg | ORAL_TABLET | Freq: Every day | ORAL | 3 refills | Status: DC

## 2018-04-29 ENCOUNTER — Ambulatory Visit: Payer: BLUE CROSS/BLUE SHIELD | Admitting: Podiatry

## 2018-05-12 ENCOUNTER — Other Ambulatory Visit: Payer: Self-pay | Admitting: Internal Medicine

## 2018-05-12 ENCOUNTER — Encounter: Payer: Self-pay | Admitting: Internal Medicine

## 2018-05-12 MED ORDER — TRAMADOL HCL 50 MG PO TABS: 50.0000 mg | ORAL_TABLET | Freq: Three times a day (TID) | ORAL | 0 refills | Status: DC | PRN

## 2018-05-20 ENCOUNTER — Ambulatory Visit: Payer: BLUE CROSS/BLUE SHIELD | Admitting: Podiatry

## 2018-05-20 ENCOUNTER — Encounter: Payer: Self-pay | Admitting: Podiatry

## 2018-05-20 DIAGNOSIS — M79676 Pain in unspecified toe(s): Secondary | ICD-10-CM

## 2018-05-20 DIAGNOSIS — Q828 Other specified congenital malformations of skin: Secondary | ICD-10-CM | POA: Diagnosis not present

## 2018-05-20 DIAGNOSIS — B351 Tinea unguium: Secondary | ICD-10-CM | POA: Diagnosis not present

## 2018-05-20 NOTE — Progress Notes (Signed)
Complaint:  Visit Type: Patient returns to my office for continued preventative foot care services. Complaint: Patient states" my nails have grown long and thick and become painful to walk and wear shoes"  The patient presents for preventative foot care services. No changes to ROS.  She says the callus on the outside of left heel is painful.  Podiatric Exam: Vascular: dorsalis pedis and posterior tibial pulses are palpable bilateral. Capillary return is immediate. Temperature gradient is WNL. Skin turgor WNL  Sensorium: Normal Semmes Weinstein monofilament test. Normal tactile sensation bilaterally. Nail Exam: Pt has thick disfigured discolored nails with subungual debris noted bilateral entire nail hallux through fifth toenails Ulcer Exam: There is no evidence of ulcer or pre-ulcerative changes or infection. Orthopedic Exam: Muscle tone and strength are WNL. No limitations in general ROM. No crepitus or effusions noted. Foot type and digits show no abnormalities. Bony prominences are unremarkable. Skin: No Porokeratosis. No infection or ulcers.  Soft tissue mass lateral aspect left heel.  Diagnosis:  Onychomycosis, , Pain in right toe, pain in left toes,  Porokeratosis left foot.  Treatment & Plan Procedures and Treatment: Consent by patient was obtained for treatment procedures.   Debridement of mycotic and hypertrophic toenails, 1 through 5 bilateral and clearing of subungual debris. No ulceration, no infection noted. Debride porokeratosis lateral aspect left heel. Return Visit-Office Procedure: Patient instructed to return to the office for a follow up visit 3 months for continued evaluation and treatment.    Helane Gunther DPM

## 2018-06-03 ENCOUNTER — Other Ambulatory Visit: Payer: Self-pay

## 2018-06-03 ENCOUNTER — Encounter: Payer: Self-pay | Admitting: Internal Medicine

## 2018-06-03 ENCOUNTER — Ambulatory Visit: Payer: BLUE CROSS/BLUE SHIELD | Admitting: Internal Medicine

## 2018-06-03 VITALS — BP 138/72 | HR 86 | Temp 98.5°F | Ht 62.0 in | Wt 222.9 lb

## 2018-06-03 DIAGNOSIS — I5030 Unspecified diastolic (congestive) heart failure: Secondary | ICD-10-CM | POA: Diagnosis not present

## 2018-06-03 DIAGNOSIS — Z79899 Other long term (current) drug therapy: Secondary | ICD-10-CM | POA: Diagnosis not present

## 2018-06-03 DIAGNOSIS — E039 Hypothyroidism, unspecified: Secondary | ICD-10-CM

## 2018-06-03 DIAGNOSIS — E785 Hyperlipidemia, unspecified: Secondary | ICD-10-CM

## 2018-06-03 DIAGNOSIS — I110 Hypertensive heart disease with heart failure: Secondary | ICD-10-CM

## 2018-06-03 DIAGNOSIS — Z79891 Long term (current) use of opiate analgesic: Secondary | ICD-10-CM | POA: Diagnosis not present

## 2018-06-03 DIAGNOSIS — M069 Rheumatoid arthritis, unspecified: Secondary | ICD-10-CM | POA: Diagnosis not present

## 2018-06-03 DIAGNOSIS — I10 Essential (primary) hypertension: Secondary | ICD-10-CM

## 2018-06-03 DIAGNOSIS — M170 Bilateral primary osteoarthritis of knee: Secondary | ICD-10-CM

## 2018-06-03 DIAGNOSIS — I5032 Chronic diastolic (congestive) heart failure: Secondary | ICD-10-CM

## 2018-06-03 MED ORDER — FUROSEMIDE 20 MG PO TABS: 20.0000 mg | ORAL_TABLET | Freq: Every day | ORAL | 1 refills | Status: DC

## 2018-06-03 MED ORDER — METHOTREXATE SODIUM 7.5 MG PO TABS: 7.5000 mg | ORAL_TABLET | ORAL | 1 refills | Status: DC

## 2018-06-03 MED ORDER — FOLIC ACID 1 MG PO TABS: 1.0000 mg | ORAL_TABLET | Freq: Every day | ORAL | 1 refills | Status: DC

## 2018-06-03 MED ORDER — TRAMADOL HCL 50 MG PO TABS: 50.0000 mg | ORAL_TABLET | Freq: Three times a day (TID) | ORAL | 1 refills | Status: DC | PRN

## 2018-06-03 NOTE — Assessment & Plan Note (Addendum)
Assessment: Initially hypertensive with SBP >150 however resolved on recheck. Reports compliance with Lisinopril 5mg  daily and Lasix 20mg  daily (dCHF) and denied dry cough or LH. Renal function stable at recent check.   Plan: Continue Lisinopril 5mg  daily. CMET obtained today; will follow renal function and lytes.

## 2018-06-03 NOTE — Assessment & Plan Note (Addendum)
Assessment & Plan: Euvolemic on exam today. Compliant with Lasix 20mg  PO daily. No orthopnea or PND. Continue Lasix and BP control. Rx for Lasix sent to pharmacy.

## 2018-06-03 NOTE — Assessment & Plan Note (Signed)
Assessment: Feeling better since resuming MTX several months ago. Followed-up as requested with normal CBC and LFTs. Has noticed some increased bruising of BL upper extremities lately but otherwise feels OK. Has an appointment with rheumatology 06/19/18.   Plan: CBC w/ diff/Plt and CMET today to evaluate for toxicity. Rx for MTX and folate sent to pharmacy. Patient given contact information and address for her Rheumatology appointment, encouraged to go.

## 2018-06-03 NOTE — Progress Notes (Signed)
   CC: follow-up of pain control, Rheumatoid Arthritis  HPI:  [name] is a 62 y.o. F with severe tricompartmental OA of BL knees, rheumatoid arthritis, HTN, HLD and hypothyroidism who presents today to discuss OA and RhA. She has no acute complaints.   For details regarding today's visit and the status of their chronic medical issues, please refer to the assessment and plan.  Past Medical History:  Diagnosis Date  . Arthritis   . Dyspnea    W/ EXERTION, +  HUMIDITY   . Emphysema lung (HCC)   . GERD (gastroesophageal reflux disease)   . Hashimoto's thyroiditis   . Heart murmur   . 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 HTN (hypertension)   . Hypercholesteremia   . Hypothyroid   . Rheumatoid arthritis(714.0)   . Varicose vein of leg    Review of Systems:   General: Denies fevers, chills, weight loss HEENT: Denies acute changes in vision, sore throat Cardiac: Denies CP, SOB, palpitations Pulmonary: Denies cough, PND, orthopnea Abd: Denies abdominal pain, changes in bowels Extremities: Admits to occasional swelling which is relieved by Lasix. Denies weakness  Physical Exam: General: Alert, in no acute distress. Pleasant and conversant HEENT: No icterus, injection or ptosis. No hoarseness or dysarthria. No JVD. Cardiac: RRR, no MGR appreciated Pulmonary: CTA BL with normal WOB on RA. Able to speak in complete sentences Abd: Soft, non-tender. +bs Extremities: Obvious changes of RhA on BL hands. Extremities warm, perfused. No significant pedal edema.   Vitals:   06/03/18 1605 06/03/18 1636  BP: (!) 156/86 138/72  Pulse: 86   Temp: 98.5 F (36.9 C)   TempSrc: Oral   SpO2: 97%   Weight: 222 lb 14.4 oz (101.1 kg)   Height: 5\' 2"  (1.575 m)    Body mass index is 40.77 kg/m.  Assessment & Plan:   See Encounters Tab for problem based charting.  Patient discussed with Dr. 

## 2018-06-03 NOTE — Patient Instructions (Signed)
It was nice seeing you today. Thank you for choosing [facility] for your Primary Care.   Today we talked about:  1) Pain. I'm glad the Tramadol seems to be working for you! I've given you a 49-month supply. Please let me know if this is no longer working for you, or if you were to experience side effects.  2) Rheumatoid Arthritis: I will refill your MTX today. Please stop by the lab on your way out so we can check your blood levels to make sure you are in a good range.  3) Blood pressure. Please continue taking your lisinopril as you are.    FOLLOW-UP INSTRUCTIONS When: 18-months For: Pain, RhA What to bring: Medications  Please contact the clinic if you have any problems, or need to be seen sooner.

## 2018-06-03 NOTE — Assessment & Plan Note (Signed)
Assessment: Patient feels she is doing better on current regimen of Tramadol 50mg  TID prn. Reports taking more or less depending on the day and how her pain is. Has currently been requiring the full TID due to weather changes but went a day last week without taking any. [place] Controlled Substance Database appropriate.   Plan: Patient comfortable with continuing current regimen. Will provide a 44-month supply however patient encouraged to touch-base if any issues arise or should her pain not be adequately controlled to help with functionality.

## 2018-06-04 LAB — CMP14 + ANION GAP
A/G RATIO: 1.4 (ref 1.2–2.2)
ALT: 11 IU/L (ref 0–32)
AST: 11 IU/L (ref 0–40)
Albumin: 3.6 g/dL (ref 3.6–4.8)
Alkaline Phosphatase: 103 IU/L (ref 39–117)
Anion Gap: 12 mmol/L (ref 10.0–18.0)
BILIRUBIN TOTAL: 0.2 mg/dL (ref 0.0–1.2)
BUN/Creatinine Ratio: 20 (ref 12–28)
BUN: 17 mg/dL (ref 8–27)
CHLORIDE: 102 mmol/L (ref 96–106)
CO2: 26 mmol/L (ref 20–29)
Calcium: 9.3 mg/dL (ref 8.7–10.3)
Creatinine, Ser: 0.85 mg/dL (ref 0.57–1.00)
GFR calc non Af Amer: 75 mL/min/{1.73_m2} (ref >59)
GFR, EST AFRICAN AMERICAN: 86 mL/min/{1.73_m2} (ref >59)
GLUCOSE: 90 mg/dL (ref 65–99)
Globulin, Total: 2.6 g/dL (ref 1.5–4.5)
POTASSIUM: 5 mmol/L (ref 3.5–5.2)
Sodium: 140 mmol/L (ref 134–144)
TOTAL PROTEIN: 6.2 g/dL (ref 6.0–8.5)

## 2018-06-04 LAB — CBC WITH DIFFERENTIAL/PLATELET
BASOS ABS: 0 10*3/uL (ref 0.0–0.2)
Basos: 0 %
EOS (ABSOLUTE): 0.1 10*3/uL (ref 0.0–0.4)
Eos: 1 %
HEMOGLOBIN: 12.3 g/dL (ref 11.1–15.9)
Hematocrit: 38.1 % (ref 34.0–46.6)
Immature Grans (Abs): 0.1 10*3/uL (ref 0.0–0.1)
Immature Granulocytes: 1 %
LYMPHS ABS: 0.7 10*3/uL (ref 0.7–3.1)
Lymphs: 8 %
MCH: 30.8 pg (ref 26.6–33.0)
MCHC: 32.3 g/dL (ref 31.5–35.7)
MCV: 95 fL (ref 79–97)
MONOCYTES: 10 %
Monocytes Absolute: 0.8 10*3/uL (ref 0.1–0.9)
NEUTROS ABS: 6.6 10*3/uL (ref 1.4–7.0)
Neutrophils: 80 %
PLATELETS: 208 10*3/uL (ref 150–450)
RBC: 4 x10E6/uL (ref 3.77–5.28)
RDW: 17.4 % — AB (ref 12.3–15.4)
WBC: 8.2 10*3/uL (ref 3.4–10.8)

## 2018-06-10 NOTE — Progress Notes (Signed)
Internal Medicine Clinic Attending  Case discussed with Dr. Molt at the time of the visit.  We reviewed the resident's history and exam and pertinent patient test results.  I agree with the assessment, diagnosis, and plan of care documented in the resident's note. 

## 2018-06-13 ENCOUNTER — Other Ambulatory Visit: Payer: Self-pay | Admitting: Physician Assistant

## 2018-06-25 ENCOUNTER — Other Ambulatory Visit: Payer: Self-pay | Admitting: Internal Medicine

## 2018-06-25 ENCOUNTER — Encounter: Payer: Self-pay | Admitting: Internal Medicine

## 2018-06-25 ENCOUNTER — Other Ambulatory Visit: Payer: Self-pay | Admitting: *Deleted

## 2018-06-25 DIAGNOSIS — E038 Other specified hypothyroidism: Secondary | ICD-10-CM

## 2018-06-25 DIAGNOSIS — E063 Autoimmune thyroiditis: Principal | ICD-10-CM

## 2018-06-25 MED ORDER — LEVOTHYROXINE SODIUM 200 MCG PO TABS: 200.0000 ug | ORAL_TABLET | Freq: Every day | ORAL | 1 refills | Status: DC

## 2018-07-03 ENCOUNTER — Encounter: Payer: Self-pay | Admitting: Internal Medicine

## 2018-07-29 ENCOUNTER — Ambulatory Visit: Payer: BLUE CROSS/BLUE SHIELD | Admitting: Podiatry

## 2018-08-04 ENCOUNTER — Ambulatory Visit: Payer: BLUE CROSS/BLUE SHIELD | Admitting: Internal Medicine

## 2018-08-04 ENCOUNTER — Other Ambulatory Visit: Payer: Self-pay

## 2018-08-04 ENCOUNTER — Encounter: Payer: Self-pay | Admitting: Internal Medicine

## 2018-08-04 VITALS — BP 108/72 | HR 102 | Temp 98.4°F | Ht 62.0 in | Wt 218.5 lb

## 2018-08-04 DIAGNOSIS — M069 Rheumatoid arthritis, unspecified: Secondary | ICD-10-CM

## 2018-08-04 DIAGNOSIS — M170 Bilateral primary osteoarthritis of knee: Secondary | ICD-10-CM | POA: Diagnosis not present

## 2018-08-04 DIAGNOSIS — R197 Diarrhea, unspecified: Secondary | ICD-10-CM | POA: Diagnosis not present

## 2018-08-04 DIAGNOSIS — E063 Autoimmune thyroiditis: Secondary | ICD-10-CM

## 2018-08-04 DIAGNOSIS — I10 Essential (primary) hypertension: Secondary | ICD-10-CM

## 2018-08-04 DIAGNOSIS — K219 Gastro-esophageal reflux disease without esophagitis: Secondary | ICD-10-CM

## 2018-08-04 DIAGNOSIS — E039 Hypothyroidism, unspecified: Secondary | ICD-10-CM

## 2018-08-04 DIAGNOSIS — E038 Other specified hypothyroidism: Secondary | ICD-10-CM

## 2018-08-04 DIAGNOSIS — Z79890 Hormone replacement therapy (postmenopausal): Secondary | ICD-10-CM

## 2018-08-04 DIAGNOSIS — E785 Hyperlipidemia, unspecified: Secondary | ICD-10-CM

## 2018-08-04 DIAGNOSIS — Z79891 Long term (current) use of opiate analgesic: Secondary | ICD-10-CM

## 2018-08-04 DIAGNOSIS — Z79899 Other long term (current) drug therapy: Secondary | ICD-10-CM

## 2018-08-04 MED ORDER — TRAMADOL HCL 50 MG PO TABS: 50.0000 mg | ORAL_TABLET | Freq: Three times a day (TID) | ORAL | 1 refills | Status: DC | PRN

## 2018-08-04 MED ORDER — SODIUM CHLORIDE 0.9 % IV BOLUS: 1000.0000 mL | Freq: Once | INTRAVENOUS | Status: DC

## 2018-08-04 MED ORDER — SIMVASTATIN 20 MG PO TABS: 20.0000 mg | ORAL_TABLET | Freq: Every day | ORAL | 1 refills | Status: DC

## 2018-08-04 MED ORDER — ONDANSETRON HCL 8 MG PO TABS: 8.0000 mg | ORAL_TABLET | Freq: Three times a day (TID) | ORAL | 0 refills | Status: DC | PRN

## 2018-08-04 MED ORDER — METHOTREXATE SODIUM 7.5 MG PO TABS: 7.5000 mg | ORAL_TABLET | ORAL | 1 refills | Status: DC

## 2018-08-04 NOTE — Patient Instructions (Signed)
It was nice seeing you today -- I'm sorry you are not feeling well.   Today we talked about: 1) Diarrhea: I'm sorry your symptoms havent improved since 8/10. It would be unusual for food poisoning to last this long and I am going to check your stool for some bacteria and parasites today.  2) I've recommended IV fluids in clinic but understand if you would like to try to rehydrate at home with assistance of medication. I've sent in a prescription for Zofran which is for nausea. Please take this every 8 hours as needed.  3) For your diarrhea, I recommend trying Imodium again and following the instructions on the box (repeating doses if no improvement).  4) I am going to get some blood work today to make sure there isnt a metabolic reason behind your upset stomach.    Please contact the clinic or go to the ED if your symptoms worsen or fail to improve with these measures. Remember to eat small, bland snacks until your stomach settles.   Return to clinic in 1 month for follow-up

## 2018-08-04 NOTE — Progress Notes (Signed)
   CC: follow-up of OA, acute diarrhea  HPI:  [name] is a 62 y.o. F with severe tricompartmental arthritis BL knees on chronic Tramadol, RhA on MTX, HTN, HLD, GERD and hypothyroidism who presents today for follow-up of pain. She also complains of persistent diarrhea since 8/10.   For details regarding today's visit and the status of their chronic medical issues, please refer to the assessment and plan.  Past Medical History:  Diagnosis Date  . Arthritis   . Dyspnea    W/ EXERTION, +  HUMIDITY   . Emphysema lung (HCC)   . GERD (gastroesophageal reflux disease)   . Hashimoto's thyroiditis   . Heart murmur   . 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 HTN (hypertension)   . Hypercholesteremia   . Hypothyroid   . Rheumatoid arthritis(714.0)   . Varicose vein of leg    Review of Systems:   General: Admits to fevers, chills (last night), weight loss, fatigue, generalized weakness. Admits to myalgias. Denies sick contacts, travel, new foods or restaurants   HEENT: Denies acute changes in vision, sore throat, runny nose, cough, congestion, headache Cardiac: Denies CP, SOB Pulmonary: Denies cough, wheezes, PND Abd: Admits to diarrhea (watery), abdominal cramping, belching, nausea, vomiting. Denies hematemesis, hematochezia, melena, pale stools.  GU: Denies decreased urine output, dysuria, hematuria or increased frequency.  Extremities: Denies falls, numbness or tingling, rashes  Physical Exam: General: Alert, in no acute distress but does look fatigued. Pleasant and conversant, tearful after refusing IVF in clinic or admission ["I come for help then I don't do what you tell me to," etc] HEENT: No icterus, injection or ptosis. No hoarseness or dysarthria  Cardiac: Tachycardic, systolic murmur. Pulmonary: CTA BL with normal WOB on RA. Able to speak in complete sentences Abd: Soft, not distended. No guarding or rigidity. Not tender on light or deep palpation of BL UQ/LQ, epigastrium and  suprapubic region. No CVA tenderness.  Extremities: Warm, perfused. No significant pedal edema. Poor skin turgor, does appear dry.  Vitals:   08/04/18 1454  BP: 108/72  Pulse: (!) 102  Temp: 98.4 F (36.9 C)  TempSrc: Oral  SpO2: 98%  Weight: 218 lb 8 oz (99.1 kg)  Height: 5\' 2"  (1.575 m)   Body mass index is 39.96 kg/m.   ORTHOSTATIC VITAL SIGNS: -Sitting 119/70 pulse 96 -Standing 123/73 pulse 102  Assessment & Plan:   See Encounters Tab for problem based charting.  Patient discussed with Dr. 

## 2018-08-04 NOTE — Assessment & Plan Note (Addendum)
Assessment:  Ms. Hodak complains of persistent diarrhea, nausea and vomiting since 8/10. Since onset, she reports >6 watery BM's a day which occur most often just after eating. Also endorses nausea and vomiting for the past few days and had subjective fevers and chills last night when laying down for bed. No abdominal pain, but does describe abdominal "soreness" starting a few days ago which she relates to vomiting. Unable to quantify PO intake but feels she's getting a least a few cups of fluids in a day but notes her muscles are sore which she reports happens when she's dehydrated. 1 similar episode several years ago due to food poisoning but symptoms lasted 1-2 days. Denies any sick contacts, changes in medications, recent antibiotics, travel, new foods or restaurants or any URI symptoms. Symptoms have remained unchanged since onset (no increase in frequency, severity, etc) and reports going through "2 boxes" of Imodium and some Pepto which did not help.   BP was lower than usual and she was tachycardic but orthostatic vital signs were negative and she was not symptomatic.      Plan: Differential includes infectious gastroenteritis, medication side effect, hyperthyroidism, IBS vs inflammatory bowel disease. While her BP was lower than usual with tachycardia, orthostatic vital signs were negative and she appears only slightly dehydrated on exam. Despite reports of frequent diarrhea, she was unable to provide a significant stool sample today in clinic and it appears this was sufficient enough to be sent only for ova and parasite examination although GI pathogen panel was ordered and preferred.   I expressed concern over her volume status and persistent diarrhea and offered IVF in clinic vs admission for rehydration however patient declined. She expressed strong desire to remain out of the hospital and would like to try rehydrating at home with anti-emetics and other supportive measures. Will check TSH  (patient on synthroid with history of intermittent compliance), metabolic panel and CBC in addition to stool studies.  -Strict return precautions; advised patient to present to ED if symptoms worsen or fail to improve -TSH -CMET, CBC w/ diff -Stool studies -Rx for Zofran sent to pharmacy; advised patient to try Imodium and/or Pepto again unless directed otherwise based on stool study results  **ADDENDUM: TSH <0.01, suspect hyperthyroidism could be contributing to current presentation. I called the patient twice 8/21 to let her know to hold Synthroid until Monday, then start 125 mcg daily if her diarrhea had improved. While I wasn't able to speak with her directly, I did leave 2 voicemails and staff has also been trying to reach her as well. Her CMET and CBC were without significant abnormality or significant dehydration. Will touch base with patient again later on this week for follow-up.

## 2018-08-05 ENCOUNTER — Telehealth: Payer: Self-pay | Admitting: *Deleted

## 2018-08-05 LAB — CBC WITH DIFFERENTIAL/PLATELET
BASOS: 0 %
Basophils Absolute: 0 10*3/uL (ref 0.0–0.2)
EOS (ABSOLUTE): 0.1 10*3/uL (ref 0.0–0.4)
Eos: 1 %
Hematocrit: 36.6 % (ref 34.0–46.6)
Hemoglobin: 11.9 g/dL (ref 11.1–15.9)
IMMATURE GRANULOCYTES: 1 %
Immature Grans (Abs): 0.1 10*3/uL (ref 0.0–0.1)
Lymphocytes Absolute: 1 10*3/uL (ref 0.7–3.1)
Lymphs: 13 %
MCH: 31.7 pg (ref 26.6–33.0)
MCHC: 32.5 g/dL (ref 31.5–35.7)
MCV: 98 fL — AB (ref 79–97)
MONOS ABS: 0.7 10*3/uL (ref 0.1–0.9)
Monocytes: 9 %
NEUTROS PCT: 76 %
Neutrophils Absolute: 5.9 10*3/uL (ref 1.4–7.0)
Platelets: 202 10*3/uL (ref 150–450)
RBC: 3.75 x10E6/uL — AB (ref 3.77–5.28)
RDW: 14.8 % (ref 12.3–15.4)
WBC: 7.8 10*3/uL (ref 3.4–10.8)

## 2018-08-05 LAB — TSH: TSH: 0.01 u[IU]/mL — ABNORMAL LOW (ref 0.450–4.500)

## 2018-08-05 LAB — CMP14 + ANION GAP
ALK PHOS: 95 IU/L (ref 39–117)
ALT: 10 IU/L (ref 0–32)
AST: 11 IU/L (ref 0–40)
Albumin/Globulin Ratio: 1.5 (ref 1.2–2.2)
Albumin: 3.8 g/dL (ref 3.6–4.8)
Anion Gap: 15 mmol/L (ref 10.0–18.0)
BUN/Creatinine Ratio: 19 (ref 12–28)
BUN: 18 mg/dL (ref 8–27)
Bilirubin Total: <0.2 mg/dL (ref 0.0–1.2)
CHLORIDE: 106 mmol/L (ref 96–106)
CO2: 21 mmol/L (ref 20–29)
Calcium: 9.2 mg/dL (ref 8.7–10.3)
Creatinine, Ser: 0.97 mg/dL (ref 0.57–1.00)
GFR calc Af Amer: 73 mL/min/{1.73_m2} (ref >59)
GFR calc non Af Amer: 64 mL/min/{1.73_m2} (ref >59)
Globulin, Total: 2.6 g/dL (ref 1.5–4.5)
Glucose: 97 mg/dL (ref 65–99)
POTASSIUM: 4.8 mmol/L (ref 3.5–5.2)
SODIUM: 142 mmol/L (ref 134–144)
Total Protein: 6.4 g/dL (ref 6.0–8.5)

## 2018-08-05 MED ORDER — LEVOTHYROXINE SODIUM 125 MCG PO TABS: 125.0000 ug | ORAL_TABLET | Freq: Every day | ORAL | 2 refills | Status: DC

## 2018-08-05 NOTE — Assessment & Plan Note (Addendum)
Assessment & Plan Patient with softer-than-usual BP and tachycardia in setting of persistent watery diarrhea. She has not taken her Lisinopril or Lasix the past several days due to nausea/vomiting and feeling dehydrated. Offered IVF in clinic and/or admission for fluids but patient declined. Advised patient to continue holding both Lisinopril and Lasix while having poor PO intake and to resume after diarrhea resolves.

## 2018-08-05 NOTE — Telephone Encounter (Signed)
Second message left requesting return call. Kinnie Feil, RN, BSN

## 2018-08-05 NOTE — Telephone Encounter (Signed)
-----   Message from [facility], DO sent at 08/05/2018  7:18 AM EDT ----- Regarding: FW: TSH/Need to stop Synthroid Hello,  Saw pt in clinic yesterday and suspected her Synthroid  might need to be adjusted. Her test showed her Synthroid dose is too high and I think her symptoms might be related.   I would like her to hold Synthroid for the next few days but it's way too early to call and I worry I won't be able to catch her before she takes her medicines.   Would someone mind calling Ms. Harbold this morning and letting her know? I will be calling her later on this morning but was hoping to catch her before taking her AM meds.   Thank you!! Dr. Vincente Liberty ----- Message ----- From: Interface, [facility] Lab Results In Sent: 08/05/2018   6:37 AM EDT To: Toma Copier Molt, DO

## 2018-08-05 NOTE — Telephone Encounter (Signed)
Left message on VM requesting return call before taking morning medications. Kinnie Feil, RN, BSN

## 2018-08-05 NOTE — Assessment & Plan Note (Signed)
Requests refill of Tramadol. Reviewed [place] Controlled Substance Database, appropriate. UDS earlier this year, appropriate. Had planned on repeat UDS today however will defer due to her acute illness -- please obtain at next clinic visit.

## 2018-08-05 NOTE — Assessment & Plan Note (Signed)
Patient with history of intermittent compliance with Synthroid. Currently prescribed daily and notes compliance except for the past few days due to nausea/vomiting. Most recent TSH 1.9 earlier this year however <0.01 on repeat today. Suspect patient has been more compliant than she typically is with her medications lately and requires a dose decrease. Asked patient to hold Synthroid for the next few days and if her diarrhea has improved, to please start Synthroid daily with close follow-up in [facility].

## 2018-08-06 NOTE — Progress Notes (Signed)
Internal Medicine Clinic Attending  Case discussed with Dr. Molt at the time of the visit.  We reviewed the resident's history and exam and pertinent patient test results.  I agree with the assessment, diagnosis, and plan of care documented in the resident's note. 

## 2018-08-20 NOTE — Telephone Encounter (Signed)
Patient states she was aware that new dose of synthroid was sent to pharmacy and has been taking new dose. Kinnie Feil, RN, BSN

## 2018-09-02 ENCOUNTER — Other Ambulatory Visit: Payer: Self-pay

## 2018-09-02 ENCOUNTER — Ambulatory Visit: Payer: BLUE CROSS/BLUE SHIELD | Admitting: Internal Medicine

## 2018-09-02 ENCOUNTER — Encounter: Payer: Self-pay | Admitting: Podiatry

## 2018-09-02 ENCOUNTER — Ambulatory Visit: Payer: BLUE CROSS/BLUE SHIELD | Admitting: Podiatry

## 2018-09-02 VITALS — BP 135/70 | HR 69 | Temp 97.8°F | Ht 62.0 in | Wt 233.7 lb

## 2018-09-02 DIAGNOSIS — M79676 Pain in unspecified toe(s): Secondary | ICD-10-CM

## 2018-09-02 DIAGNOSIS — B351 Tinea unguium: Secondary | ICD-10-CM | POA: Diagnosis not present

## 2018-09-02 DIAGNOSIS — Z79899 Other long term (current) drug therapy: Secondary | ICD-10-CM

## 2018-09-02 DIAGNOSIS — E038 Other specified hypothyroidism: Secondary | ICD-10-CM | POA: Diagnosis not present

## 2018-09-02 DIAGNOSIS — E039 Hypothyroidism, unspecified: Secondary | ICD-10-CM

## 2018-09-02 DIAGNOSIS — Z79890 Hormone replacement therapy (postmenopausal): Secondary | ICD-10-CM

## 2018-09-02 DIAGNOSIS — Z23 Encounter for immunization: Secondary | ICD-10-CM | POA: Diagnosis not present

## 2018-09-02 DIAGNOSIS — E063 Autoimmune thyroiditis: Secondary | ICD-10-CM | POA: Diagnosis not present

## 2018-09-02 DIAGNOSIS — I10 Essential (primary) hypertension: Secondary | ICD-10-CM | POA: Diagnosis not present

## 2018-09-02 MED ORDER — FOLIC ACID 1 MG PO TABS: 1.0000 mg | ORAL_TABLET | Freq: Every day | ORAL | 1 refills | Status: DC

## 2018-09-02 NOTE — Patient Instructions (Signed)
Thank you for visiting clinic today. We will check your thyroid levels today, will call you for any abnormal results. Please follow-up with your PCP in 1 month.

## 2018-09-02 NOTE — Progress Notes (Signed)
   CC: For follow-up of her abnormal TSH.  HPI:  [name] is a 62 y.o. with past medical history as listed below came to the clinic for follow-up of her hypothyroidism and recent low TSH.  Patient was on Synthroid 200 MCG, TSH done last month due to persistent diarrhea shows low TSH of 0.01 and her Synthroid dose was decreased to 125 MCG.  Her diarrhea has been resolved and she has no other symptoms. Here today to repeat her TSH.  Past Medical History:  Diagnosis Date  . Arthritis   . Dyspnea    W/ EXERTION, +  HUMIDITY   . Emphysema lung (HCC)   . GERD (gastroesophageal reflux disease)   . Hashimoto's thyroiditis   . Heart murmur   . 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 HTN (hypertension)   . Hypercholesteremia   . Hypothyroid   . Rheumatoid arthritis(714.0)   . Varicose vein of leg    Review of Systems: Negative except mentioned in HPI.  Physical Exam:  Vitals:   09/02/18 1356  BP: 135/70  Pulse: 69  Temp: 97.8 F (36.6 C)  TempSrc: Oral  SpO2: 100%  Weight: 233 lb 11.2 oz (106 kg)  Height: 5\' 2"  (1.575 m)   General: Vital signs reviewed.  Patient is well-developed and well-nourished, in no acute distress and cooperative with exam.  Head: Normocephalic and atraumatic. Eyes: EOMI, conjunctivae normal, no scleral icterus.  Cardiovascular: RRR, S1 normal, S2 normal, systolic murmurs, Pulmonary/Chest: Clear to auscultation bilaterally, no wheezes, rales, or rhonchi. Abdominal: Soft, non-tender, non-distended, BS +,  Extremities: No lower extremity edema bilaterally,  pulses symmetric and intact bilaterally. No cyanosis or clubbing. Skin: Warm, dry and intact. No rashes or erythema. Psychiatric: Normal mood and affect. speech and behavior is normal. Cognition and memory are normal.  Assessment & Plan:   See Encounters Tab for problem based charting.  Patient discussed with Dr. .

## 2018-09-02 NOTE — Assessment & Plan Note (Addendum)
Patient came to repeat TSH after decreasing her dose to 125 MCG.  Dose was decreased 4 weeks ago. Her previous TSH was very low. Her diarrhea has been resolved.  Repeat TSH.  Addendum.  TSH increased to 6.9 with decreased in dose. Increase her dose of Synthroid to 150 MCG daily. Called the patient and discussed the changes. Follow-up in 1 month for repeat TSH.

## 2018-09-02 NOTE — Progress Notes (Signed)
Complaint:  Visit Type: Patient returns to my office for continued preventative foot care services. Complaint: Patient states" my nails have grown long and thick and become painful to walk and wear shoes"  The patient presents for preventative foot care services. No changes to ROS.  She says the callus on the outside of left heel is painful.  Podiatric Exam: Vascular: dorsalis pedis and posterior tibial pulses are palpable bilateral. Capillary return is immediate. Temperature gradient is WNL. Skin turgor WNL  Sensorium: Normal Semmes Weinstein monofilament test. Normal tactile sensation bilaterally. Nail Exam: Pt has thick disfigured discolored nails with subungual debris noted bilateral entire nail hallux through fifth toenails Ulcer Exam: There is no evidence of ulcer or pre-ulcerative changes or infection. Orthopedic Exam: Muscle tone and strength are WNL. No limitations in general ROM. No crepitus or effusions noted. Foot type and digits show no abnormalities. Bony prominences are unremarkable. Skin: No Porokeratosis. No infection or ulcers.  Soft tissue mass lateral aspect left heel.  Diagnosis:  Onychomycosis, , Pain in right toe, pain in left toes,    Treatment & Plan Procedures and Treatment: Consent by patient was obtained for treatment procedures.   Debridement of mycotic and hypertrophic toenails, 1 through 5 bilateral and clearing of subungual debris. No ulceration, no infection noted.  Return Visit-Office Procedure: Patient instructed to return to the office for a follow up visit 10 weeks  for continued evaluation and treatment.    Helane Gunther DPM

## 2018-09-02 NOTE — Assessment & Plan Note (Signed)
BP Readings from Last 3 Encounters:  09/02/18 135/70  08/04/18 108/72  06/03/18 138/72   Blood pressure was within goal today.  Continue current management.

## 2018-09-03 LAB — TSH: TSH: 6.9 u[IU]/mL — ABNORMAL HIGH (ref 0.450–4.500)

## 2018-09-03 MED ORDER — LEVOTHYROXINE SODIUM 150 MCG PO TABS: 150.0000 ug | ORAL_TABLET | Freq: Every day | ORAL | 2 refills | Status: DC

## 2018-09-03 NOTE — Addendum Note (Signed)
Addended by: Arnetha Courser on: 09/03/2018 04:55 PM   Modules accepted: Orders

## 2018-09-03 NOTE — Progress Notes (Signed)
Medicine attending: Medical history, presenting problems, physical findings, and medications, reviewed with resident physician Dr Sumayya Amin on the day of the patient visit and I concur with her evaluation and management plan. 

## 2018-09-07 ENCOUNTER — Encounter: Payer: Self-pay | Admitting: Internal Medicine

## 2018-09-20 ENCOUNTER — Encounter: Payer: Self-pay | Admitting: *Deleted

## 2018-09-23 ENCOUNTER — Other Ambulatory Visit: Payer: Self-pay | Admitting: Internal Medicine

## 2018-10-04 ENCOUNTER — Other Ambulatory Visit: Payer: Self-pay | Admitting: Internal Medicine

## 2018-10-05 NOTE — Telephone Encounter (Signed)
Next appt scheduled 10/25 in [facility].

## 2018-10-06 ENCOUNTER — Encounter: Payer: BLUE CROSS/BLUE SHIELD | Admitting: Internal Medicine

## 2018-10-08 ENCOUNTER — Other Ambulatory Visit: Payer: Self-pay | Admitting: Internal Medicine

## 2018-10-08 NOTE — Telephone Encounter (Signed)
Refill Request   methotrexate (RHEUMATREX) 7.5 MG tablet  [id number] - [place], [place] - [address]

## 2018-10-08 NOTE — Telephone Encounter (Signed)
Ok. Will need CMP and CBC at visit tomorrow.

## 2018-10-09 ENCOUNTER — Ambulatory Visit: Payer: BLUE CROSS/BLUE SHIELD

## 2018-10-16 ENCOUNTER — Ambulatory Visit: Payer: BLUE CROSS/BLUE SHIELD

## 2018-10-16 ENCOUNTER — Encounter: Payer: Self-pay | Admitting: Internal Medicine

## 2018-10-25 ENCOUNTER — Other Ambulatory Visit: Payer: Self-pay | Admitting: Internal Medicine

## 2018-10-25 ENCOUNTER — Encounter: Payer: Self-pay | Admitting: Internal Medicine

## 2018-10-27 ENCOUNTER — Other Ambulatory Visit: Payer: Self-pay

## 2018-10-27 NOTE — Telephone Encounter (Signed)
traMADol (ULTRAM) 50 MG tablet   Refill request @  [facility] [phone] - [place], [place] - [phone] AT [facility] OF [address] [facility] [address] [phone] (Phone) [phone] (Fax)

## 2018-10-28 ENCOUNTER — Ambulatory Visit: Payer: BLUE CROSS/BLUE SHIELD | Admitting: Internal Medicine

## 2018-10-28 ENCOUNTER — Other Ambulatory Visit: Payer: Self-pay | Admitting: Physician Assistant

## 2018-10-28 ENCOUNTER — Encounter: Payer: Self-pay | Admitting: Internal Medicine

## 2018-10-28 ENCOUNTER — Other Ambulatory Visit: Payer: Self-pay

## 2018-10-28 VITALS — BP 128/75 | HR 86 | Temp 98.1°F | Ht 62.0 in | Wt 219.0 lb

## 2018-10-28 DIAGNOSIS — Z79890 Hormone replacement therapy (postmenopausal): Secondary | ICD-10-CM

## 2018-10-28 DIAGNOSIS — E038 Other specified hypothyroidism: Secondary | ICD-10-CM | POA: Diagnosis not present

## 2018-10-28 DIAGNOSIS — E039 Hypothyroidism, unspecified: Secondary | ICD-10-CM

## 2018-10-28 DIAGNOSIS — E063 Autoimmune thyroiditis: Secondary | ICD-10-CM | POA: Diagnosis not present

## 2018-10-28 NOTE — Patient Instructions (Signed)
Thank you for coming to the clinic today. It was a pleasure to see you.   Today we are checking your TSH, if the result is abnormal and the dose of your synthroid needs to be adjusted I will give you a call and let you know.   FOLLOW-UP INSTRUCTIONS When: 6 weeks with Dr. Maryla Morrow or in the acute care clinic  For: Follow up of your hypothyroidism  What to bring: all of your medication bottles   Please call the internal medicine center clinic if you have any questions or concerns, we may be able to help and keep you from a long and expensive emergency room wait. Our clinic and after hours phone number is [phone], the best time to call is Monday through Friday 9 am to 4 pm but there is always someone available 24/7 if you have an emergency. If you need medication refills please notify your pharmacy one week in advance and they will send [place] a request.

## 2018-10-28 NOTE — Progress Notes (Signed)
   CC: follow up of thyroid function   HPI:  [name] is a 63 y.o. with PMH as listed below who presents for follow up of thyroid function . Please see the assessment and plans for the status of the patient chronic medical problems.   Past Medical History:  Diagnosis Date  . Arthritis   . Dyspnea    W/ EXERTION, +  HUMIDITY   . Emphysema lung (HCC)   . GERD (gastroesophageal reflux disease)   . Hashimoto's thyroiditis   . Heart murmur   . 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 HTN (hypertension)   . Hypercholesteremia   . Hypothyroid   . Rheumatoid arthritis(714.0)   . Varicose vein of leg    Review of Systems:  Refer to history of present illness and assessment and plans for pertinent review of systems, all others reviewed and negative  Physical Exam:  Vitals:   10/28/18 1314  BP: 128/75  Pulse: 86  Temp: 98.1 F (36.7 C)  TempSrc: Oral  SpO2: 100%  Weight: 219 lb (99.3 kg)  Height: 5\' 2"  (1.575 m)   General: well appearing, no distress  Cardiac: regular rate and rhythm, no murmurs, no peripheral edema  Pulm: lungs are clear to auscultation   Assessment & Plan:   Hypothyroidism  Patient is here for follow up of hypothyroidism. She has a history of intermittent compliance with synthroid and variabilities in TSH testing. At last office visit her TSH was 6.9 so synthroid was increased from 125 mg to 150 mg daily. She denies fatigue, constipation, cold sensitivity or hair loss. TSH today is consistent with controlled hypothyroidism  - continue synthroid 150 mg daily   See Encounters Tab for problem based charting.  Patient discussed with Dr. 

## 2018-10-29 ENCOUNTER — Encounter: Payer: Self-pay | Admitting: Internal Medicine

## 2018-10-29 ENCOUNTER — Encounter: Payer: Self-pay | Admitting: *Deleted

## 2018-10-29 LAB — TSH: TSH: 0.721 u[IU]/mL (ref 0.450–4.500)

## 2018-10-29 MED ORDER — TRAMADOL HCL 50 MG PO TABS: ORAL_TABLET | ORAL | 3 refills | Status: DC

## 2018-10-29 NOTE — Telephone Encounter (Signed)
Received phone call from pt, she is upset because her tramadol has still not been called in.  Pt has requested refill on 11/10 and on 11/12.  States she is completely out of her RX.  Last office visit was yesterday in [facility], hypothyroid f/u She is scheduled with her PCP in 12/2018. Thank you! SChaplin, RN,BSN

## 2018-10-29 NOTE — Assessment & Plan Note (Signed)
Patient is here for follow up of hypothyroidism. She has a history of intermittent compliance with synthroid and variabilities in TSH testing. At last office visit her TSH was 6.9 so synthroid was increased from 125 mg to 150 mg daily. She denies fatigue, constipation, cold sensitivity or hair loss. TSH today is consistent with controlled hypothyroidism  - continue synthroid 150 mg daily

## 2018-10-29 NOTE — Telephone Encounter (Signed)
Reviewed chart.  Refill history on  CSRS is appropriate.  Will refill until appointment with PCP in January.  Thanks

## 2018-10-30 NOTE — Progress Notes (Signed)
Internal Medicine Clinic Attending  Case discussed with Dr. Blum at the time of the visit.  We reviewed the resident's history and exam and pertinent patient test results.  I agree with the assessment, diagnosis, and plan of care documented in the resident's note. 

## 2018-11-11 ENCOUNTER — Encounter: Payer: Self-pay | Admitting: Internal Medicine

## 2018-11-11 ENCOUNTER — Encounter: Payer: Self-pay | Admitting: Podiatry

## 2018-11-11 ENCOUNTER — Ambulatory Visit: Payer: BLUE CROSS/BLUE SHIELD | Admitting: Podiatry

## 2018-11-11 DIAGNOSIS — M79676 Pain in unspecified toe(s): Secondary | ICD-10-CM | POA: Diagnosis not present

## 2018-11-11 DIAGNOSIS — Q828 Other specified congenital malformations of skin: Secondary | ICD-10-CM

## 2018-11-11 DIAGNOSIS — B351 Tinea unguium: Secondary | ICD-10-CM | POA: Diagnosis not present

## 2018-11-11 NOTE — Progress Notes (Signed)
Complaint:  Visit Type: Patient returns to my office for continued preventative foot care services. Complaint: Patient states" my nails have grown long and thick and become painful to walk and wear shoes"  The patient presents for preventative foot care services. No changes to ROS.  She says the callus on the outside of left heel is painful.  Podiatric Exam: Vascular: dorsalis pedis and posterior tibial pulses are palpable bilateral. Capillary return is immediate. Temperature gradient is WNL. Skin turgor WNL  Sensorium: Normal Semmes Weinstein monofilament test. Normal tactile sensation bilaterally. Nail Exam: Pt has thick disfigured discolored nails with subungual debris noted bilateral entire nail hallux through fifth toenails Ulcer Exam: There is no evidence of ulcer or pre-ulcerative changes or infection. Orthopedic Exam: Muscle tone and strength are WNL. No limitations in general ROM. No crepitus or effusions noted. Foot type and digits show no abnormalities. Bony prominences are unremarkable. Skin: No Porokeratosis. No infection or ulcers.  Soft tissue mass lateral aspect left heel.  Diagnosis:  Onychomycosis, , Pain in right toe, pain in left toes,    Treatment & Plan Procedures and Treatment: Consent by patient was obtained for treatment procedures.   Debridement of mycotic and hypertrophic toenails, 1 through 5 bilateral and clearing of subungual debris. No ulceration, no infection noted. Recommend fungal solution. Return Visit-Office Procedure: Patient instructed to return to the office for a follow up visit 10 weeks  for continued evaluation and treatment.    Helane Gunther DPM

## 2018-12-02 ENCOUNTER — Other Ambulatory Visit: Payer: Self-pay | Admitting: *Deleted

## 2018-12-02 DIAGNOSIS — E063 Autoimmune thyroiditis: Principal | ICD-10-CM

## 2018-12-02 DIAGNOSIS — E038 Other specified hypothyroidism: Secondary | ICD-10-CM

## 2018-12-02 MED ORDER — PANTOPRAZOLE SODIUM 40 MG PO TBEC: DELAYED_RELEASE_TABLET | ORAL | 2 refills | Status: DC

## 2018-12-02 MED ORDER — LEVOTHYROXINE SODIUM 150 MCG PO TABS: 150.0000 ug | ORAL_TABLET | Freq: Every day | ORAL | 2 refills | Status: DC

## 2018-12-03 ENCOUNTER — Telehealth: Payer: Self-pay | Admitting: *Deleted

## 2018-12-03 NOTE — Telephone Encounter (Signed)
Call from [facility] pharmacy to clarify dosage of Pantoprazole - which is 40 mg, take 80 mg at bedtime.

## 2018-12-04 NOTE — Telephone Encounter (Signed)
It has been the previous instruction. And when I called the patient, she reports sever reflux at night that responded to 80 mg before. Can take 40 mg, 1 or 2 daily. I will double check with the attending who saw her last time for it and if any new update, will call her.  Please let me know if you need me any further call to pharmacy.  Thank you

## 2018-12-17 ENCOUNTER — Encounter: Payer: BLUE CROSS/BLUE SHIELD | Admitting: Internal Medicine

## 2018-12-31 ENCOUNTER — Encounter: Payer: BLUE CROSS/BLUE SHIELD | Admitting: Internal Medicine

## 2018-12-31 ENCOUNTER — Encounter: Payer: Self-pay | Admitting: Internal Medicine

## 2019-01-11 ENCOUNTER — Other Ambulatory Visit: Payer: Self-pay | Admitting: *Deleted

## 2019-01-19 MED ORDER — FOLIC ACID 1 MG PO TABS: 1.0000 mg | ORAL_TABLET | Freq: Every day | ORAL | 1 refills | Status: DC

## 2019-01-19 MED ORDER — PREDNISONE 5 MG PO TABS: 5.0000 mg | ORAL_TABLET | Freq: Every day | ORAL | 1 refills | Status: DC

## 2019-01-20 ENCOUNTER — Ambulatory Visit: Payer: BLUE CROSS/BLUE SHIELD | Admitting: Podiatry

## 2019-06-17 ENCOUNTER — Other Ambulatory Visit: Payer: Self-pay | Admitting: Internal Medicine

## 2019-06-22 ENCOUNTER — Other Ambulatory Visit: Payer: Self-pay | Admitting: *Deleted

## 2019-06-22 DIAGNOSIS — I5032 Chronic diastolic (congestive) heart failure: Secondary | ICD-10-CM

## 2019-06-23 MED ORDER — FUROSEMIDE 20 MG PO TABS: 20.0000 mg | ORAL_TABLET | Freq: Every day | ORAL | 1 refills | Status: DC

## 2019-06-23 MED ORDER — LISINOPRIL 10 MG PO TABS: 5.0000 mg | ORAL_TABLET | Freq: Every day | ORAL | 3 refills | Status: DC

## 2019-08-04 ENCOUNTER — Other Ambulatory Visit: Payer: Self-pay | Admitting: Internal Medicine

## 2019-09-27 ENCOUNTER — Other Ambulatory Visit: Payer: Self-pay | Admitting: Internal Medicine

## 2019-10-28 ENCOUNTER — Encounter: Payer: Self-pay | Admitting: Internal Medicine

## 2019-11-03 ENCOUNTER — Encounter: Payer: Self-pay | Admitting: Internal Medicine

## 2019-11-30 ENCOUNTER — Ambulatory Visit: Payer: Self-pay

## 2019-12-18 ENCOUNTER — Other Ambulatory Visit: Payer: Self-pay | Admitting: Internal Medicine

## 2019-12-18 DIAGNOSIS — I5032 Chronic diastolic (congestive) heart failure: Secondary | ICD-10-CM

## 2019-12-20 NOTE — Telephone Encounter (Signed)
Called patient and no answer.  LMOM for patient to contact our office  to sch an appointment. If no response will send patient a letter in the mail to schedule an appointment.

## 2019-12-20 NOTE — Telephone Encounter (Signed)
Patient has not been seen in a year and has no blood work. Needs to come in to be seen if she wants to continue to get her medications through [place]. Thank you.

## 2020-01-11 ENCOUNTER — Emergency Department (HOSPITAL_BASED_OUTPATIENT_CLINIC_OR_DEPARTMENT_OTHER)
Admission: EM | Admit: 2020-01-11 | Discharge: 2020-01-11 | Disposition: A | Discharge status: Home / Self Care | Payer: BLUE CROSS/BLUE SHIELD | Attending: Emergency Medicine | Admitting: Emergency Medicine

## 2020-01-11 ENCOUNTER — Emergency Department (HOSPITAL_BASED_OUTPATIENT_CLINIC_OR_DEPARTMENT_OTHER): Payer: BLUE CROSS/BLUE SHIELD

## 2020-01-11 ENCOUNTER — Encounter (HOSPITAL_BASED_OUTPATIENT_CLINIC_OR_DEPARTMENT_OTHER): Payer: Self-pay | Admitting: Emergency Medicine

## 2020-01-11 ENCOUNTER — Other Ambulatory Visit: Payer: Self-pay

## 2020-01-11 DIAGNOSIS — I2694 Multiple subsegmental pulmonary emboli without acute cor pulmonale: Secondary | ICD-10-CM | POA: Insufficient documentation

## 2020-01-11 DIAGNOSIS — Y999 Unspecified external cause status: Secondary | ICD-10-CM | POA: Insufficient documentation

## 2020-01-11 DIAGNOSIS — I5030 Unspecified diastolic (congestive) heart failure: Secondary | ICD-10-CM | POA: Diagnosis not present

## 2020-01-11 DIAGNOSIS — Z880 Allergy status to penicillin: Secondary | ICD-10-CM | POA: Insufficient documentation

## 2020-01-11 DIAGNOSIS — S299XXA Unspecified injury of thorax, initial encounter: Secondary | ICD-10-CM | POA: Diagnosis present

## 2020-01-11 DIAGNOSIS — E782 Mixed hyperlipidemia: Secondary | ICD-10-CM | POA: Insufficient documentation

## 2020-01-11 DIAGNOSIS — Y939 Activity, unspecified: Secondary | ICD-10-CM | POA: Diagnosis not present

## 2020-01-11 DIAGNOSIS — I110 Hypertensive heart disease with heart failure: Secondary | ICD-10-CM | POA: Insufficient documentation

## 2020-01-11 DIAGNOSIS — X58XXXA Exposure to other specified factors, initial encounter: Secondary | ICD-10-CM | POA: Insufficient documentation

## 2020-01-11 DIAGNOSIS — Y929 Unspecified place or not applicable: Secondary | ICD-10-CM | POA: Insufficient documentation

## 2020-01-11 DIAGNOSIS — S22000A Wedge compression fracture of unspecified thoracic vertebra, initial encounter for closed fracture: Secondary | ICD-10-CM | POA: Diagnosis not present

## 2020-01-11 DIAGNOSIS — E039 Hypothyroidism, unspecified: Secondary | ICD-10-CM | POA: Diagnosis not present

## 2020-01-11 DIAGNOSIS — Z91040 Latex allergy status: Secondary | ICD-10-CM | POA: Insufficient documentation

## 2020-01-11 DIAGNOSIS — R079 Chest pain, unspecified: Secondary | ICD-10-CM

## 2020-01-11 DIAGNOSIS — F17210 Nicotine dependence, cigarettes, uncomplicated: Secondary | ICD-10-CM | POA: Insufficient documentation

## 2020-01-11 LAB — CBC WITH DIFFERENTIAL/PLATELET
Abs Immature Granulocytes: 0.11 10*3/uL — ABNORMAL HIGH (ref 0.00–0.07)
Basophils Absolute: 0 10*3/uL (ref 0.0–0.1)
Basophils Relative: 0 %
Eosinophils Absolute: 0 10*3/uL (ref 0.0–0.5)
Eosinophils Relative: 1 %
HCT: 40.6 % (ref 36.0–46.0)
Hemoglobin: 12.7 g/dL (ref 12.0–15.0)
Immature Granulocytes: 1 %
Lymphocytes Relative: 9 %
Lymphs Abs: 0.8 10*3/uL (ref 0.7–4.0)
MCH: 33.6 pg (ref 26.0–34.0)
MCHC: 31.3 g/dL (ref 30.0–36.0)
MCV: 107.4 fL — ABNORMAL HIGH (ref 80.0–100.0)
Monocytes Absolute: 0.9 10*3/uL (ref 0.1–1.0)
Monocytes Relative: 11 %
Neutro Abs: 6.5 10*3/uL (ref 1.7–7.7)
Neutrophils Relative %: 78 %
Platelets: 203 10*3/uL (ref 150–400)
RBC: 3.78 MIL/uL — ABNORMAL LOW (ref 3.87–5.11)
RDW: 16.1 % — ABNORMAL HIGH (ref 11.5–15.5)
WBC: 8.3 10*3/uL (ref 4.0–10.5)
nRBC: 0 % (ref 0.0–0.2)

## 2020-01-11 LAB — TROPONIN I (HIGH SENSITIVITY)
Troponin I (High Sensitivity): 11 ng/L (ref <18)
Troponin I (High Sensitivity): 10 ng/L (ref <18)

## 2020-01-11 LAB — COMPREHENSIVE METABOLIC PANEL
ALT: 14 U/L (ref 0–44)
AST: 18 U/L (ref 15–41)
Albumin: 3.6 g/dL (ref 3.5–5.0)
Alkaline Phosphatase: 73 U/L (ref 38–126)
Anion gap: 8 (ref 5–15)
BUN: 21 mg/dL (ref 8–23)
CO2: 26 mmol/L (ref 22–32)
Calcium: 9.3 mg/dL (ref 8.9–10.3)
Chloride: 102 mmol/L (ref 98–111)
Creatinine, Ser: 1.26 mg/dL — ABNORMAL HIGH (ref 0.44–1.00)
GFR calc Af Amer: 53 mL/min — ABNORMAL LOW (ref >60)
GFR calc non Af Amer: 46 mL/min — ABNORMAL LOW (ref >60)
Glucose, Bld: 99 mg/dL (ref 70–99)
Potassium: 4 mmol/L (ref 3.5–5.1)
Sodium: 136 mmol/L (ref 135–145)
Total Bilirubin: 0.5 mg/dL (ref 0.3–1.2)
Total Protein: 6.8 g/dL (ref 6.5–8.1)

## 2020-01-11 MED ORDER — HYDROCODONE-ACETAMINOPHEN 5-325 MG PO TABS
1.0000 | ORAL_TABLET | Freq: Once | ORAL | Status: AC
  Administered 2020-01-11: 1 via ORAL
  Filled 2020-01-11: qty 1

## 2020-01-11 MED ORDER — RIVAROXABAN 20 MG PO TABS: 20.0000 mg | ORAL_TABLET | Freq: Every day | ORAL | Status: DC

## 2020-01-11 MED ORDER — MORPHINE SULFATE (PF) 4 MG/ML IV SOLN
4.0000 mg | Freq: Once | INTRAVENOUS | Status: AC
  Administered 2020-01-11: 4 mg via INTRAVENOUS
  Filled 2020-01-11: qty 1

## 2020-01-11 MED ORDER — ONDANSETRON 4 MG PO TBDP: 4.0000 mg | ORAL_TABLET | Freq: Three times a day (TID) | ORAL | 0 refills | Status: DC | PRN

## 2020-01-11 MED ORDER — SENNOSIDES-DOCUSATE SODIUM 8.6-50 MG PO TABS: 1.0000 | ORAL_TABLET | Freq: Every evening | ORAL | 0 refills | Status: DC | PRN

## 2020-01-11 MED ORDER — RIVAROXABAN 15 MG PO TABS
15.0000 mg | ORAL_TABLET | Freq: Two times a day (BID) | ORAL | Status: DC
  Administered 2020-01-11: 15 mg via ORAL
  Filled 2020-01-11: qty 1

## 2020-01-11 MED ORDER — RIVAROXABAN (XARELTO) VTE STARTER PACK (15 & 20 MG): ORAL_TABLET | ORAL | 0 refills | Status: DC

## 2020-01-11 MED ORDER — HYDROCODONE-ACETAMINOPHEN 5-325 MG PO TABS: 1.0000 | ORAL_TABLET | Freq: Four times a day (QID) | ORAL | 0 refills | Status: DC | PRN

## 2020-01-11 MED ORDER — ONDANSETRON HCL 4 MG/2ML IJ SOLN
4.0000 mg | Freq: Once | INTRAMUSCULAR | Status: AC
  Administered 2020-01-11: 4 mg via INTRAVENOUS
  Filled 2020-01-11: qty 2

## 2020-01-11 MED ORDER — IOHEXOL 350 MG/ML SOLN
100.0000 mL | Freq: Once | INTRAVENOUS | Status: AC | PRN
  Administered 2020-01-11: 100 mL via INTRAVENOUS

## 2020-01-11 MED FILL — HYDROCODON-APAP 5-325: 5-325 | 2 days supply | Qty: 10 | Fill #0

## 2020-01-11 MED FILL — STOOL SOFTENER/LAXATIVE 50-: 50-8.6 | 20 days supply | Qty: 20 | Fill #0

## 2020-01-11 MED FILL — ONDANSETRON ODT 4 MG TABLET: 4 | 7 days supply | Qty: 20 | Fill #0

## 2020-01-11 MED FILL — XARELTO STARTER PACK: 15 & 20 | 28 days supply | Qty: 51 | Fill #0

## 2020-01-11 NOTE — ED Notes (Signed)
Patient transported to X-ray 

## 2020-01-11 NOTE — ED Provider Notes (Signed)
Emergency Department Provider Note   I have reviewed the triage vital signs and the nursing notes.   HISTORY  Chief Complaint Chest Pain   HPI [name] is a 64 y.o. female with past medical history of hypertension, high cholesterol, and RA presents to the emergency department with severe right back pain radiating around to the right chest.  Symptoms began last night in a severe way but has had discomfort over the past 3 days in the mid back.  No left-sided pain symptoms.  She describes occasional shortness of breath.  Pain is worse with movement.  When she lies flat she has more severe pain and finds it difficult to breathe.  No similar pains in the past.  She is been taking ibuprofen overnight with no change in symptoms.  No vomiting or diarrhea.  Past Medical History:  Diagnosis Date  . Arthritis   . Dyspnea    W/ EXERTION, +  HUMIDITY   . Emphysema lung (HCC)   . GERD (gastroesophageal reflux disease)   . Hashimoto's thyroiditis   . Heart murmur   . 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 HTN (hypertension)   . Hypercholesteremia   . Hypothyroid   . Rheumatoid arthritis(714.0)   . Varicose vein of leg     Patient Active Problem List   Diagnosis Date Noted  . Mitral valve annular calcification 04/07/2018  . Depression 04/07/2018  . Diastolic heart failure (HCC) 02/10/2018  . Family history of hemophilia B 12/02/2017  . Healthcare maintenance 05/07/2017  . Onychomycosis 05/07/2017  . Tricompartment osteoarthritis of both knees 02/19/2017  . Hyperlipidemia 11/14/2016  . Rheumatoid arthritis (HCC) 07/08/2008  . OBESITY 05/04/2008  . Essential hypertension 05/04/2008  . ALLERGIC RHINITIS 05/04/2008  . GERD 05/04/2008  . VARICOSE VEINS LOWER EXTREMITIES W/INFLAMMATION 11/02/2007  . Hypothyroidism 08/18/2007    Past Surgical History:  Procedure Laterality Date  . APPENDECTOMY    . CHOLECYSTECTOMY    . ELBOW SURGERY     LEFT  . ENDOSCOPIC VEIN LASER TREATMENT     RIGHT LEG  . EXPLORATORY LAPAROTOMY    . HAND SURGERY     RIGHT   . HEEL SPUR SURGERY     BILAT  . INSERTION OF MESH N/A 08/04/2017   Procedure: INSERTION OF MESH;  Surgeon: Darnell Level, MD;  Location: [facility] OR;  Service: General;  Laterality: N/A;  . partial thyroidectomy  2015  . TONSILLECTOMY    . TUBAL LIGATION    . VENTRAL HERNIA REPAIR  08/04/2017  . VENTRAL HERNIA REPAIR N/A 08/04/2017   Procedure: LAPAROSCOPIC VENTRAL INCISIONAL HERNIA REPAIR WITH MESH;  Surgeon: Darnell Level, MD;  Location: [facility] OR;  Service: General;  Laterality: N/A;    Allergies Oxycodone-acetaminophen, Penicillins, Latex, Oxycodone-acetaminophen, Tyloxapol, Adhesive [tape], and Strawberry extract  Family History  Problem Relation Age of Onset  . Diabetes Mother   . Kidney disease Mother   . Lung cancer Father   . Alzheimer's disease Maternal Grandmother   . Stroke Maternal Grandfather   . Heart disease Paternal Grandmother   . Unexplained death Paternal Grandfather     Social History Social History   Tobacco Use  . Smoking status: Current Every Day Smoker    Packs/day: 0.25    Years: 40.00    Pack years: 10.00    Types: Cigarettes    Last attempt to quit: 02/23/2014    Years since quitting: 5.8  . Smokeless tobacco: Never Used  . Tobacco comment: < 1/2  pack/day  Substance Use Topics  . Alcohol use: No  . Drug use: No    Review of Systems  Constitutional: No fever/chills Eyes: No visual changes. ENT: No sore throat. Cardiovascular: Positive chest pain. Respiratory: Positive shortness of breath. Gastrointestinal: No abdominal pain.  No nausea, no vomiting.  No diarrhea.  No constipation. Genitourinary: Negative for dysuria. Musculoskeletal: Negative for back pain. Skin: Negative for rash.  10-point ROS otherwise negative.  ____________________________________________   PHYSICAL EXAM:  VITAL SIGNS: ED Triage Vitals  Enc Vitals Group     BP 01/11/20 0951 (!) 142/80     Pulse  Rate 01/11/20 0951 82     Resp 01/11/20 0951 (!) 24     Temp 01/11/20 0951 98.2 F (36.8 C)     Temp Source 01/11/20 0951 Oral     SpO2 01/11/20 0951 99 %     Weight 01/11/20 0948 224 lb (101.6 kg)     Height 01/11/20 0948 5\' 3"  (1.6 m)   Constitutional: Alert and oriented. Well appearing and in no acute distress. Eyes: Conjunctivae are normal.  Head: Atraumatic. Nose: No congestion/rhinnorhea. Mouth/Throat: Mucous membranes are moist.   Neck: No stridor.   Cardiovascular: Normal rate, regular rhythm. Good peripheral circulation. Grossly normal heart sounds.   Respiratory: Normal respiratory effort.  No retractions. Lungs CTAB. Gastrointestinal: Soft and nontender. No distention.  Musculoskeletal: No lower extremity tenderness nor edema. No gross deformities of extremities.  Tenderness over the right, upper, posterior chest and right lateral chest wall.  No bruising, crepitus.  No sternum tenderness.  Neurologic:  Normal speech and language.  Skin:  Skin is warm, dry and intact. No rash noted.   ____________________________________________   LABS (all labs ordered are listed, but only abnormal results are displayed)  Labs Reviewed  COMPREHENSIVE METABOLIC PANEL - Abnormal; Notable for the following components:      Result Value   Creatinine, Ser 1.26 (*)    GFR calc non Af Amer 46 (*)    GFR calc Af Amer 53 (*)    All other components within normal limits  CBC WITH DIFFERENTIAL/PLATELET - Abnormal; Notable for the following components:   RBC 3.78 (*)    MCV 107.4 (*)    RDW 16.1 (*)    Abs Immature Granulocytes 0.11 (*)    All other components within normal limits  TROPONIN I (HIGH SENSITIVITY)  TROPONIN I (HIGH SENSITIVITY)   ____________________________________________  EKG   EKG Interpretation  Date/Time:  Tuesday January 11 2020 09:49:02 EST Ventricular Rate:  88 PR Interval:    QRS Duration: 92 QT Interval:  371 QTC Calculation: 449 R Axis:   40 Text  Interpretation: Sinus rhythm Abnormal R-wave progression, early transition No STEMI Confirmed by Nanda Quinton [phone]) on 01/11/2020 9:52:50 AM       ____________________________________________  RADIOLOGY  CTA and plain films reviewed.  ____________________________________________   PROCEDURES  Procedure(s) performed:   Procedures  CRITICAL CARE Performed by: Margette Fast Total critical care time: 35 minutes Critical care time was exclusive of separately billable procedures and treating other patients. Critical care was necessary to treat or prevent imminent or life-threatening deterioration. Critical care was time spent personally by me on the following activities: development of treatment plan with patient and/or surrogate as well as nursing, discussions with consultants, evaluation of patient's response to treatment, examination of patient, obtaining history from patient or surrogate, ordering and performing treatments and interventions, ordering and review of laboratory studies, ordering and review of  radiographic studies, pulse oximetry and re-evaluation of patient's condition.  Alona Bene, MD Emergency Medicine  ____________________________________________   INITIAL IMPRESSION / ASSESSMENT AND PLAN / ED COURSE  Pertinent labs & imaging results that were available during my care of the patient were reviewed by me and considered in my medical decision making (see chart for details).   Patient presents emergency department for evaluation of chest and right back pain worsening significantly last night.  Occasional shortness of breath.  Patient has tachypnea with some splinting and laying mainly on her left side for comfort.  Most likely musculoskeletal but given her degree of distress I do plan for lab work.  EKG is unremarkable.  Right side plain film with dedicated films of the ribs and chest were ordered.  Aortic pathology much less likely with the right sided symptoms  primarily and equal pulses.   12:15 PM  CTA and lab work reviewed.  Troponin initially 11.  Second troponin is pending.  CTA obtained with clear chest x-ray and no obvious cause for pain.  Age-indeterminate and T-spine compression fractures.  Patient with no recent trauma.  No definite acute fracture.  Suspect this could be the source of the patient's pain is her pain distribution is somewhat radicular.  Will f/u with PCP and Spine surgery PRN. Short term opiate Rx provided after review of [place] drug database. She does have question of "tiny" PE on the left.  Patient is not experiencing pain in the left.  She does not have hypoxemia or shortness of breath.  I discussed this finding with her.  In this setting I do plan to start anticoagulation.  She does not have contraindication to anticoagulation.  She can follow-up with her primary care doctor regarding continuing this Bellany Elbaum-term. Advised to stop NSAIDs at home. No leg pain or swelling to suspect DVT. Discussed ED return precautions.  ____________________________________________  FINAL CLINICAL IMPRESSION(S) / ED DIAGNOSES  Final diagnoses:  Right-sided chest pain  Compression fracture of thoracic vertebra, initial encounter, unspecified thoracic vertebral level (HCC)  Multiple subsegmental pulmonary emboli without acute cor pulmonale (HCC)     MEDICATIONS GIVEN DURING THIS VISIT:  Medications  morphine 4 MG/ML injection 4 mg (4 mg Intravenous Given 01/11/20 1009)  ondansetron (ZOFRAN) injection 4 mg (4 mg Intravenous Given 01/11/20 1009)  iohexol (OMNIPAQUE) 350 MG/ML injection 100 mL (100 mLs Intravenous Contrast Given 01/11/20 1121)  HYDROcodone-acetaminophen (NORCO/VICODIN) 5-325 MG per tablet 1 tablet (1 tablet Oral Given 01/11/20 1245)     NEW OUTPATIENT MEDICATIONS STARTED DURING THIS VISIT:  Discharge Medication List as of 01/11/2020  1:15 PM    START taking these medications   Details  HYDROcodone-acetaminophen (NORCO/VICODIN) 5-325  MG tablet Take 1 tablet by mouth every 6 (six) hours as needed for severe pain., Starting Tue 01/11/2020, Normal    ondansetron (ZOFRAN ODT) 4 MG disintegrating tablet Take 1 tablet (4 mg total) by mouth every 8 (eight) hours as needed., Starting Tue 01/11/2020, Normal    Rivaroxaban 15 & 20 MG TBPK Follow package directions: Take one 15mg  tablet by mouth twice a day. On day 22, switch to one 20mg  tablet once a day. Take with food., Normal    senna-docusate (SENOKOT-S) 8.6-50 MG tablet Take 1 tablet by mouth at bedtime as needed for mild constipation or moderate constipation., Starting Tue 01/11/2020, Normal        Note:  This document was prepared using Dragon voice recognition software and may include unintentional dictation errors.  03-24-1979, MD,  [facility] Emergency Medicine    Adriahna Shearman, Arlyss Repress, MD 01/12/20 1350

## 2020-01-11 NOTE — ED Triage Notes (Signed)
Chest pain since last night, mid back pain for the last 3 days, went to work today, worse today. SOB at times, Pain worse with  Movement

## 2020-01-11 NOTE — Discharge Instructions (Signed)
You were seen in the emergency department today with right-sided chest pain which I suspect is coming from degenerative disease and old compression fractures in your spine.  Please take the pain medication as needed for severe pain.  Do not take this at the same time as you take your tramadol.   Your CT scan today showed a likely very small blood clot on the left side of your lungs.  I am starting you on a blood thinning medication called Xarelto.  Please take as instructed.  You cannot take aspirin, ibuprofen, naproxen with this medication as it can increase your risk of bleeding.  If you develop sudden severe headache, blood in your bowel movements, or sustained any trauma to the head or other area of your body you should present for medical evaluation as this can cause severe and even life-threatening bleeding.  Please call your primary care doctor today to schedule the next available appointment to perform further evaluation for these blood clots and monitor your symptoms.  If your back pain worsens you should call the spine doctors listed for evaluation.  Return to the emergency department any new or suddenly worsening symptoms.

## 2020-01-11 NOTE — ED Notes (Signed)
Up to BR, given snacks and po fluids

## 2020-01-11 NOTE — Progress Notes (Signed)
ANTICOAGULATION CONSULT NOTE - Initial Consult  Pharmacy Consult for xarelto Indication: pulmonary embolus  Patient Measurements: Height: 5\' 3"  (160 cm) Weight: 224 lb (101.6 kg) IBW/kg (Calculated) : 52.4  Vital Signs: Temp: 98.2 F (36.8 C) (01/26 0951) Temp Source: Oral (01/26 0951) BP: 128/82 (01/26 1000) Pulse Rate: 82 (01/26 1000)  Labs: Recent Labs    01/11/20 1005  HGB 12.7  HCT 40.6  PLT 203  CREATININE 1.26*  TROPONINIHS 11    Estimated Creatinine Clearance: 52.7 mL/min (A) (by C-G formula based on SCr of 1.26 mg/dL (H)).  Assessment: 86 yof presented to the ED with CP. Found to have a small PE. To start xarelto. CBC is WNL. Scr is slightly elevated. She is not on anticoagulation PTA.   Goal of Therapy:  Therapeutic anticoagulation Monitor platelets by anticoagulation protocol: Yes   Plan:  Xarelto 15mg  PO BID x 21 days then 20mg  daily thereafter F/u renal fxn, S&S of bleeding  [name], 69 01/11/2020,12:20 PM

## 2021-01-05 ENCOUNTER — Emergency Department (HOSPITAL_COMMUNITY): Payer: BLUE CROSS/BLUE SHIELD

## 2021-01-05 ENCOUNTER — Inpatient Hospital Stay (HOSPITAL_COMMUNITY)
Admission: EM | Admit: 2021-01-05 | Discharge: 2021-01-10 | DRG: 177 | Disposition: A | Discharge status: Skilled Nursing Facility | Payer: BLUE CROSS/BLUE SHIELD | Attending: Family Medicine | Admitting: Family Medicine

## 2021-01-05 DIAGNOSIS — Z148 Genetic carrier of other disease: Secondary | ICD-10-CM

## 2021-01-05 DIAGNOSIS — J9601 Acute respiratory failure with hypoxia: Secondary | ICD-10-CM | POA: Diagnosis present

## 2021-01-05 DIAGNOSIS — Z79890 Hormone replacement therapy (postmenopausal): Secondary | ICD-10-CM

## 2021-01-05 DIAGNOSIS — M069 Rheumatoid arthritis, unspecified: Secondary | ICD-10-CM | POA: Diagnosis present

## 2021-01-05 DIAGNOSIS — U071 COVID-19: Principal | ICD-10-CM | POA: Diagnosis present

## 2021-01-05 DIAGNOSIS — Z841 Family history of disorders of kidney and ureter: Secondary | ICD-10-CM

## 2021-01-05 DIAGNOSIS — Z832 Family history of diseases of the blood and blood-forming organs and certain disorders involving the immune mechanism: Secondary | ICD-10-CM

## 2021-01-05 DIAGNOSIS — I130 Hypertensive heart and chronic kidney disease with heart failure and stage 1 through stage 4 chronic kidney disease, or unspecified chronic kidney disease: Secondary | ICD-10-CM | POA: Diagnosis present

## 2021-01-05 DIAGNOSIS — Z79899 Other long term (current) drug therapy: Secondary | ICD-10-CM

## 2021-01-05 DIAGNOSIS — N1832 Chronic kidney disease, stage 3b: Secondary | ICD-10-CM | POA: Diagnosis present

## 2021-01-05 DIAGNOSIS — K219 Gastro-esophageal reflux disease without esophagitis: Secondary | ICD-10-CM | POA: Diagnosis present

## 2021-01-05 DIAGNOSIS — Z7901 Long term (current) use of anticoagulants: Secondary | ICD-10-CM

## 2021-01-05 DIAGNOSIS — Z8249 Family history of ischemic heart disease and other diseases of the circulatory system: Secondary | ICD-10-CM

## 2021-01-05 DIAGNOSIS — J449 Chronic obstructive pulmonary disease, unspecified: Secondary | ICD-10-CM | POA: Diagnosis not present

## 2021-01-05 DIAGNOSIS — J439 Emphysema, unspecified: Secondary | ICD-10-CM | POA: Diagnosis present

## 2021-01-05 DIAGNOSIS — E860 Dehydration: Secondary | ICD-10-CM | POA: Diagnosis present

## 2021-01-05 DIAGNOSIS — E785 Hyperlipidemia, unspecified: Secondary | ICD-10-CM | POA: Diagnosis present

## 2021-01-05 DIAGNOSIS — I340 Nonrheumatic mitral (valve) insufficiency: Secondary | ICD-10-CM | POA: Diagnosis present

## 2021-01-05 DIAGNOSIS — N179 Acute kidney failure, unspecified: Secondary | ICD-10-CM

## 2021-01-05 DIAGNOSIS — R0902 Hypoxemia: Secondary | ICD-10-CM

## 2021-01-05 DIAGNOSIS — I5032 Chronic diastolic (congestive) heart failure: Secondary | ICD-10-CM | POA: Diagnosis present

## 2021-01-05 DIAGNOSIS — Z20822 Contact with and (suspected) exposure to covid-19: Secondary | ICD-10-CM

## 2021-01-05 DIAGNOSIS — F17210 Nicotine dependence, cigarettes, uncomplicated: Secondary | ICD-10-CM | POA: Diagnosis present

## 2021-01-05 DIAGNOSIS — E063 Autoimmune thyroiditis: Secondary | ICD-10-CM | POA: Diagnosis present

## 2021-01-05 DIAGNOSIS — Z86711 Personal history of pulmonary embolism: Secondary | ICD-10-CM

## 2021-01-05 DIAGNOSIS — M170 Bilateral primary osteoarthritis of knee: Secondary | ICD-10-CM | POA: Diagnosis present

## 2021-01-05 LAB — LACTATE DEHYDROGENASE: LDH: 199 U/L — ABNORMAL HIGH (ref 98–192)

## 2021-01-05 LAB — COMPREHENSIVE METABOLIC PANEL
ALT: 22 U/L (ref 0–44)
AST: 41 U/L (ref 15–41)
Albumin: 2.8 g/dL — ABNORMAL LOW (ref 3.5–5.0)
Alkaline Phosphatase: 86 U/L (ref 38–126)
Anion gap: 12 (ref 5–15)
BUN: 21 mg/dL (ref 8–23)
CO2: 21 mmol/L — ABNORMAL LOW (ref 22–32)
Calcium: 8.3 mg/dL — ABNORMAL LOW (ref 8.9–10.3)
Chloride: 100 mmol/L (ref 98–111)
Creatinine, Ser: 1.65 mg/dL — ABNORMAL HIGH (ref 0.44–1.00)
GFR, Estimated: 35 mL/min — ABNORMAL LOW (ref >60)
Glucose, Bld: 102 mg/dL — ABNORMAL HIGH (ref 70–99)
Potassium: 3.7 mmol/L (ref 3.5–5.1)
Sodium: 133 mmol/L — ABNORMAL LOW (ref 135–145)
Total Bilirubin: 1 mg/dL (ref 0.3–1.2)
Total Protein: 6.7 g/dL (ref 6.5–8.1)

## 2021-01-05 LAB — LACTIC ACID, PLASMA
Lactic Acid, Venous: 1.4 mmol/L (ref 0.5–1.9)
Lactic Acid, Venous: 1.3 mmol/L (ref 0.5–1.9)
Lactic Acid, Venous: 1.3 mmol/L (ref 0.5–1.9)

## 2021-01-05 LAB — CBC WITH DIFFERENTIAL/PLATELET
Abs Immature Granulocytes: 0.05 10*3/uL (ref 0.00–0.07)
Basophils Absolute: 0 10*3/uL (ref 0.0–0.1)
Basophils Relative: 0 %
Eosinophils Absolute: 0 10*3/uL (ref 0.0–0.5)
Eosinophils Relative: 0 %
HCT: 39.9 % (ref 36.0–46.0)
Hemoglobin: 12.3 g/dL (ref 12.0–15.0)
Immature Granulocytes: 1 %
Lymphocytes Relative: 13 %
Lymphs Abs: 0.7 10*3/uL (ref 0.7–4.0)
MCH: 29.9 pg (ref 26.0–34.0)
MCHC: 30.8 g/dL (ref 30.0–36.0)
MCV: 97.1 fL (ref 80.0–100.0)
Monocytes Absolute: 0.7 10*3/uL (ref 0.1–1.0)
Monocytes Relative: 11 %
Neutro Abs: 4.4 10*3/uL (ref 1.7–7.7)
Neutrophils Relative %: 75 %
Platelets: 142 10*3/uL — ABNORMAL LOW (ref 150–400)
RBC: 4.11 MIL/uL (ref 3.87–5.11)
RDW: 14.3 % (ref 11.5–15.5)
WBC: 5.9 10*3/uL (ref 4.0–10.5)
nRBC: 0 % (ref 0.0–0.2)

## 2021-01-05 LAB — PROCALCITONIN: Procalcitonin: <0.1 ng/mL

## 2021-01-05 LAB — D-DIMER, QUANTITATIVE: D-Dimer, Quant: 1.91 ug/mL-FEU — ABNORMAL HIGH (ref 0.00–0.50)

## 2021-01-05 LAB — FERRITIN: Ferritin: 692 ng/mL — ABNORMAL HIGH (ref 11–307)

## 2021-01-05 LAB — C-REACTIVE PROTEIN: CRP: 6.5 mg/dL — ABNORMAL HIGH (ref <1.0)

## 2021-01-05 LAB — TSH: TSH: 53.317 u[IU]/mL — ABNORMAL HIGH (ref 0.350–4.500)

## 2021-01-05 LAB — SARS CORONAVIRUS 2 (TAT 6-24 HRS): SARS Coronavirus 2: POSITIVE — AB

## 2021-01-05 MED ORDER — ACETAMINOPHEN 650 MG RE SUPP: 650.0000 mg | Freq: Four times a day (QID) | RECTAL | Status: DC | PRN

## 2021-01-05 MED ORDER — LEFLUNOMIDE 20 MG PO TABS
10.0000 mg | ORAL_TABLET | ORAL | Status: DC
  Administered 2021-01-08 – 2021-01-10 (×3): 10 mg via ORAL
  Filled 2021-01-05 (×3): qty 0.5

## 2021-01-05 MED ORDER — SODIUM CHLORIDE 0.9 % IV SOLN: INTRAVENOUS | Status: DC

## 2021-01-05 MED ORDER — SODIUM CHLORIDE 0.9 % IV BOLUS
1000.0000 mL | Freq: Once | INTRAVENOUS | Status: AC
  Administered 2021-01-05: 1000 mL via INTRAVENOUS

## 2021-01-05 MED ORDER — SODIUM CHLORIDE 0.9 % IV SOLN
100.0000 mg | Freq: Every day | INTRAVENOUS | Status: AC
  Administered 2021-01-06 – 2021-01-09 (×4): 100 mg via INTRAVENOUS
  Filled 2021-01-05 (×4): qty 20

## 2021-01-05 MED ORDER — POLYETHYLENE GLYCOL 3350 17 G PO PACK: 17.0000 g | PACK | Freq: Every day | ORAL | Status: DC | PRN

## 2021-01-05 MED ORDER — DEXAMETHASONE 4 MG PO TABS
6.0000 mg | ORAL_TABLET | ORAL | Status: DC
  Administered 2021-01-05 – 2021-01-09 (×5): 6 mg via ORAL
  Filled 2021-01-05 (×5): qty 2

## 2021-01-05 MED ORDER — RIVAROXABAN 20 MG PO TABS
20.0000 mg | ORAL_TABLET | Freq: Every day | ORAL | Status: DC
  Administered 2021-01-06 – 2021-01-09 (×4): 20 mg via ORAL
  Filled 2021-01-05 (×5): qty 1

## 2021-01-05 MED ORDER — SODIUM CHLORIDE 0.9 % IV SOLN
200.0000 mg | Freq: Once | INTRAVENOUS | Status: AC
  Administered 2021-01-05: 200 mg via INTRAVENOUS
  Filled 2021-01-05: qty 40

## 2021-01-05 MED ORDER — ACETAMINOPHEN 325 MG PO TABS: 650.0000 mg | ORAL_TABLET | Freq: Four times a day (QID) | ORAL | Status: DC | PRN

## 2021-01-05 MED ORDER — SIMVASTATIN 20 MG PO TABS
20.0000 mg | ORAL_TABLET | Freq: Every day | ORAL | Status: DC
  Administered 2021-01-06 – 2021-01-09 (×4): 20 mg via ORAL
  Filled 2021-01-05 (×4): qty 1

## 2021-01-05 MED ORDER — LEVOTHYROXINE SODIUM 75 MCG PO TABS
150.0000 ug | ORAL_TABLET | Freq: Every day | ORAL | Status: DC
  Administered 2021-01-06 – 2021-01-10 (×5): 150 ug via ORAL
  Filled 2021-01-05 (×6): qty 2

## 2021-01-05 NOTE — ED Triage Notes (Signed)
Patient BIB [facility] for COVID exposure. Patient's son that she lives with tested positive recently. Patient complains of fever and shortness of breath, SpO2 94% on room air.

## 2021-01-05 NOTE — H&P (Addendum)
Family Medicine Teaching [facility] Admission History and Physical Service Pager: [phone]  Patient name: [name] Medical record number: [id number] Date of birth: 10-Aug-1956 Age: 65 y.o. Gender: female  Primary Care Provider: Virgilio Belling, PA-C Consultants: None Code Status: Full Code Preferred Emergency Contact: [name] [phone]  Chief Complaint: Myalgias   Assessment and Plan: [name] is a 65 y.o. female presenting with myalgias. 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    Acute Hypoxic Respiratory Failure 2/2 COVID-19 Patient presenting with fatigue and Myalgia.  Also has had significant diarrhea and little oral intake for past week.  Patient lives with son who recently tested positive for COVID-19.  Patient also tested positive on admission.  Has been vaccinated x2 in Spring of last year, but has not received booster.  In the ED she is afebrile and hemodynamically stable. Lungs do have crackles throughout, otherwise moving air well, no wheezes.  Patient does not have an O2 requirement at home.  Chest X-Ray shows no signs of Pneumonia. Patient denies any chest pain.  Less concern for PE given patient taking daily Xarelto.  Though low threshold for obtaining CTA given history of PE and COVID infection.  Less concern for underlying bacterial infection given normal chest X-Ray and normal LA x 2.  Patient does have underlying COPD with 80 pack year smoking history, recently quit last year.  Currently satting around 90% on 2L [place].  Plan to admit to monitor O2 status and start treatment for COVID-19 with an O2 requirement. - Admit to Inpatient, Med/Surg, Attending Dr Lum Babe - Continuous Cardiac and Pulse Ox - Will start Remdesevir & Decadron - Wean O2 as tolerated, Keep FiO2 levels >88% - Tylenol 650 mg q6hr - f/u Blood cultures - Vital Signs q4hr -  f/u D-Dimer (improved from 1.9>1.8), Ferritin, LDH, and Pro-Cal (no bact cause) - AM CBC, CMP - PT/OT - Strongly recommend patient receive Booster following discharge given COPD and RA with immunosupression  COPD  History of Tobacco Use Disorder Quit smoking over 1 year ago, [facility] EMR indicates patient may be quit smoking in 2015? Had PFTs done in 2015 however we cannot see these results.  Was previously smoking 2 packs daily for 40 years or so, however previous record contradicts this, states patient was smoking 1/4 pack daily.  Does not have an oxygen requirement at home.  No significant sputum production.  Less likely having an exacerbation. Patient's chest x-ray showing hyperexpansion of lungs.  Patient prescribed albuterol at home "2 puffs q4 PRN dyspnea". - Hold home albuterol - Consider Low dose CT outpatient per USPSTF guidelines  History of PE Patient has history of PEs that were found on CTA in January 2021 for which she takes Xarelto.  Per [facility] med history from November, patient taking Xarelto 20 mg nightly.   - Continue home Xarelto  Hypertension SBP initially in 90's.  Received 3L total IV NS boluses and SBP increased to 100-120.  Currently stable.  On Lasix 20 mg at home. - Hold home Lasix  Mild AKI in setting of CKD3b  Patient has fluctuating Creatinine with mutliple hospitalizations over past year.  Baseline appears to be around 1.3-1.5 with CKD stage 3a. Cr is 1.65 on admission with GFR 35.  Could also be worsening progression on of CKD. Patient presented dehydrated, reports she has not been able to keep much down over the last  week or two. Obtained 1L NS bolus x3, Cr improved to 1.38. Plan to continue IV fluids given poor PO intake. Will keep below maintenance rate at 100 mL/hr. - Start NS IVF 100 ml/hr - Avoid Renal Toxic medications - Daily BMP - Hold home Lasix  Rheumatoid arthritis Patient with significant findings in hands on PE for RA.  Patient knows she takes  Tramadol 50 mg q8hr PRN and Prednisone  daily. Unsure about other medications. Records from [facility] also indicate she is Arava 10 MG by mouth daily. M-F, off on Sat and 5 mg on Sun and Vit D supplement daily. - Continue home Arava - Hold home Tramadol - Hold home Prednisone while receiving Decadron  Hashimoto's thyroiditis Last TSH 02/08/2020 was 1.615, TSH this admission 53. On Synthroid 150 mcg at home. -Obtain TSH -Continue home Synthroid   Hyperlipidemia Lipid panel on 03/13/2020 showed LDL of 63. On Simvastatin 20 mg at home. ASCVD Risk 7%. -Continue home simvastatin   GERD On Protonix 40 mg BID -Hold Home Protonix  Mild Mitral Regurg ECHO in 02/2020 showed Severely calcified annulus. Moderately thickened, moderately calcified leaflets . There was mild regurgitation.  EF 60-65%. -Hold home lasix at this time  FEN/GI: Full Diet Prophylaxis: Xarelto  Disposition: Med/Tele  History of Present Illness:  [name] is a 65 y.o. female presenting with malaise.   Lives with son Arlys John who recently tested positive for COVID.  Her last COVID vaccine was in April or May, she is not boosted She has not eaten very much in 1 week, today has only been able to eat a small amount.  Patient reports that a couple of weeks ago she started feeling achy all over, also reports some decreased taste and smell, lots of diarrhea, mild cough, and lots of fatigue.  She lives with her son Arlys John at home, Arlys John recently tested positive for COVID.  Patient reports that she has vomited a couple of times but denies hematemesis.  Her symptoms have slowly worsened over the last couple of weeks.  She reports that she has been unable to keep anything down over the last week.  Patient was vaccinated against COVID in April/May, has not been boosted.  Review Of Systems: Per HPI with the following additions:   Review of Systems  Constitutional: Positive for activity change, appetite change and fatigue.  HENT:  Negative for congestion.   Respiratory: Negative for cough and shortness of breath.   Cardiovascular: Negative for chest pain.  Gastrointestinal: Positive for diarrhea, nausea and vomiting. Negative for abdominal distention, abdominal pain and blood in stool.  Musculoskeletal: Positive for myalgias.  Neurological: Positive for weakness.     Patient Active Problem List   Diagnosis Date Noted  . Mitral valve annular calcification 04/07/2018  . Depression 04/07/2018  . Diastolic heart failure (HCC) 02/10/2018  . Family history of hemophilia B 12/02/2017  . Healthcare maintenance 05/07/2017  . Onychomycosis 05/07/2017  . Tricompartment osteoarthritis of both knees 02/19/2017  . Hyperlipidemia 11/14/2016  . Rheumatoid arthritis (HCC) 07/08/2008  . OBESITY 05/04/2008  . Essential hypertension 05/04/2008  . ALLERGIC RHINITIS 05/04/2008  . GERD 05/04/2008  . VARICOSE VEINS LOWER EXTREMITIES W/INFLAMMATION 11/02/2007  . Hypothyroidism 08/18/2007    Past Medical History: Past Medical History:  Diagnosis Date  . Arthritis   . Dyspnea    W/ EXERTION, +  HUMIDITY   . Emphysema lung (HCC)   . GERD (gastroesophageal reflux disease)   . Hashimoto's thyroiditis   . Heart  murmur   . 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 HTN (hypertension)   . Hypercholesteremia   . Hypothyroid   . Rheumatoid arthritis(714.0)   . Varicose vein of leg     Past Surgical History: Past Surgical History:  Procedure Laterality Date  . APPENDECTOMY    . CHOLECYSTECTOMY    . ELBOW SURGERY     LEFT  . ENDOSCOPIC VEIN LASER TREATMENT     RIGHT LEG  . EXPLORATORY LAPAROTOMY    . HAND SURGERY     RIGHT   . HEEL SPUR SURGERY     BILAT  . INSERTION OF MESH N/A 08/04/2017   Procedure: INSERTION OF MESH;  Surgeon: Darnell Level, MD;  Location: [facility] OR;  Service: General;  Laterality: N/A;  . partial thyroidectomy  2015  . TONSILLECTOMY    . TUBAL LIGATION    . VENTRAL HERNIA REPAIR  08/04/2017  . VENTRAL HERNIA REPAIR  N/A 08/04/2017   Procedure: LAPAROSCOPIC VENTRAL INCISIONAL HERNIA REPAIR WITH MESH;  Surgeon: Darnell Level, MD;  Location: [facility] OR;  Service: General;  Laterality: N/A;    Social History: Social History   Tobacco Use  . Smoking status: Current Every Day Smoker    Packs/day: 0.25    Years: 40.00    Pack years: 10.00    Types: Cigarettes    Last attempt to quit: 02/23/2014    Years since quitting: 6.8  . Smokeless tobacco: Never Used  . Tobacco comment: < 1/2 pack/day  Vaping Use  . Vaping Use: Never used  Substance Use Topics  . Alcohol use: No  . Drug use: No   Additional social history: Patient reports 2 packs of cigarettes daily x 40 years Please also refer to relevant sections of EMR.  Family History: Family History  Problem Relation Age of Onset  . Diabetes Mother   . Kidney disease Mother   . Lung cancer Father   . Alzheimer's disease Maternal Grandmother   . Stroke Maternal Grandfather   . Heart disease Paternal Grandmother   . Unexplained death Paternal Grandfather     Allergies and Medications: Allergies  Allergen Reactions  . Oxycodone-Acetaminophen Nausea Only and Other (See Comments)    Violent Vomiting Violent Vomiting  . Penicillins Anaphylaxis, Swelling and Rash    Has patient had a PCN reaction causing immediate rash, facial/tongue/throat swelling, SOB or lightheadedness with hypotension: Yes Has patient had a PCN reaction causing severe rash involving mucus membranes or skin necrosis: No Has patient had a PCN reaction that required hospitalization No Has patient had a PCN reaction occurring within the last 10 years: No If all of the above answers are "NO", then may proceed with Cephalosporin use.   . Latex Hives and Itching    Blisters (also)  . Oxycodone-Acetaminophen Nausea And Vomiting  . Tyloxapol Nausea And Vomiting  . Adhesive [Tape] Rash    Patient prefers paper tape  . Strawberry Extract Hives, Itching and Rash   No current  facility-administered medications on file prior to encounter.   Current Outpatient Medications on File Prior to Encounter  Medication Sig Dispense Refill  . Ascorbic Acid (VITAMIN C) 1000 MG tablet Take 1,000 mg by mouth daily.    . cholecalciferol (VITAMIN D) 1000 units tablet take 1 tablet by mouth once daily    . folic acid (FOLVITE) 1 MG tablet TAKE ONE TABLET BY MOUTH ONE TIME DAILY 90 tablet 1  . furosemide (LASIX) 20 MG tablet Take 1 tablet (20 mg total)  by mouth daily. 90 tablet 1  . HYDROcodone-acetaminophen (NORCO/VICODIN) 5-325 MG tablet Take 1 tablet by mouth every 6 (six) hours as needed for severe pain. 10 tablet 0  . levothyroxine (SYNTHROID, LEVOTHROID) 150 MCG tablet Take 1 tablet (150 mcg total) by mouth daily. 30 tablet 2  . lisinopril (ZESTRIL) 10 MG tablet Take 0.5 tablets (5 mg total) by mouth daily. 45 tablet 3  . Multiple Vitamins-Minerals (ONE-A-DAY WOMENS 50+ ADVANTAGE) TABS Take 1 tablet by mouth daily.    . ondansetron (ZOFRAN ODT) 4 MG disintegrating tablet Take 1 tablet (4 mg total) by mouth every 8 (eight) hours as needed. 20 tablet 0  . ondansetron (ZOFRAN) 8 MG tablet Take 1 tablet (8 mg total) by mouth every 8 (eight) hours as needed for nausea or vomiting. 20 tablet 0  . pantoprazole (PROTONIX) 40 MG tablet TAKE TWO TABLETS BY MOUTH AT BEDTIME 180 tablet 2  . potassium chloride (K-DUR) 10 MEQ tablet TAKE 1 TABLET BY MOUTH ONCE DAILY 90 tablet 0  . predniSONE (DELTASONE) 5 MG tablet TAKE ONE TABLET BY MOUTH ONE TIME DAILY 90 tablet 1  . Rivaroxaban 15 & 20 MG TBPK Follow package directions: Take one 15mg  tablet by mouth twice a day. On day 22, switch to one 20mg  tablet once a day. Take with food. 51 each 0  . senna-docusate (SENOKOT-S) 8.6-50 MG tablet Take 1 tablet by mouth at bedtime as needed for mild constipation or moderate constipation. 20 tablet 0  . simvastatin (ZOCOR) 20 MG tablet Take 1 tablet (20 mg total) by mouth daily at 6 PM. 90 tablet 1  .  traMADol (ULTRAM) 50 MG tablet TAKE 1 TABLET (50MG ) BY MOUTH 3 TIMES DAILY AS NEEDED FOR MODERATE OR SEVERE PAIN 90 tablet 3  . TREXALL 7.5 MG tablet TAKE 1 TABLET BY MOUTH ONCE A WEEK 12 tablet 1    Objective: BP (!) 114/101   Pulse 96   Temp 100.2 F (37.9 C) (Oral)   Resp (!) 24   SpO2 96%  Exam:  Physical Exam Constitutional:      General: She is not in acute distress. HENT:     Head: Normocephalic and atraumatic.     Mouth/Throat:     Mouth: Mucous membranes are moist.  Pulmonary:     Effort: Pulmonary effort is normal.     Breath sounds: Rales present.     Comments: Significant rales throughout, good air movement Musculoskeletal:     Right hand: Deformity present. Decreased range of motion. Normal pulse.     Left hand: Deformity present. Decreased range of motion. Normal pulse.     Right lower leg: No edema.     Left lower leg: No edema.     Comments: Has significant Rheumatoid nodules with boutonneire deformity on hand bilaterally Non-pitting edema bilaterally in legs with apparent venous statsis in right leg  Neurological:     Mental Status: She is alert.     Cranial Nerves: No cranial nerve deficit.     Labs and Imaging: CBC BMET  Recent Labs  Lab 01/05/21 1427  WBC 5.9  HGB 12.3  HCT 39.9  PLT 142*   Recent Labs  Lab 01/05/21 1427  NA 133*  K 3.7  CL 100  CO2 21*  BUN 21  CREATININE 1.65*  GLUCOSE 102*  CALCIUM 8.3*      EXAM: PORTABLE CHEST 1 VIEW  COMPARISON:  01/11/2020.  FINDINGS: Mild enlargement the cardiac silhouette. No consolidation. Scattered calcified  granulomas, better characterized on recent CT chest. No visible pleural effusions or pneumothorax. No acute osseous abnormality. Thoracic inlet clips.  IMPRESSION: 1. No evidence of acute cardiopulmonary disease. 2. Chronic cardiomegaly and central pulmonary vascular congestion.   EKG: Normal Sinus rthym  Jovita Kussmaul, MD 01/05/2021, 7:49 PM PGY-1, [facility]  Family Medicine FPTS Intern pager: [phone], text pages welcome  FPTS Upper-Level Resident Addendum  I have independently interviewed and examined the patient. I have discussed the above with the original author and agree with their documentation. My edits for correction/addition/clarification are in pink. Please see also any attending notes.    Peggyann Shoals, DO PGY-3, [facility] 01/06/2021 8:00 AM  FPTS Service pager: [phone] (text pages welcome through [facility])

## 2021-01-05 NOTE — ED Notes (Signed)
Pt provided meal tray

## 2021-01-05 NOTE — ED Provider Notes (Signed)
[facility] EMERGENCY DEPARTMENT Provider Note   CSN: [id number] Arrival date & time: 01/05/21  1311     History Chief Complaint  Patient presents with  . Covid Exposure    [name] is a 65 y.o. 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  She states is been ongoing for about a week.  Her son recently tested positive for COVID.  She lives with her son.  She states she has had symptoms for about a week but they have worsened.  She states that she has not had any abdominal pain, nausea/vomiting but she has had multiple episodes of nonbloody diarrhea.  She states she has not had much of an appetite.  She states she used to be a former smoker but does not anymore.  No prior history of asthma or COPD.  She has been vaccinated x3.  She denies any chest pain.  The history is provided by the patient.       Past Medical History:  Diagnosis Date  . Arthritis   . Dyspnea    W/ EXERTION, +  HUMIDITY   . Emphysema lung (HCC)   . GERD (gastroesophageal reflux disease)   . Hashimoto's thyroiditis   . Heart murmur   . 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 HTN (hypertension)   . Hypercholesteremia   . Hypothyroid   . Rheumatoid arthritis(714.0)   . Varicose vein of leg     Patient Active Problem List   Diagnosis Date Noted  . Mitral valve annular calcification 04/07/2018  . Depression 04/07/2018  . Diastolic heart failure (HCC) 02/10/2018  . Family history of hemophilia B 12/02/2017  . Healthcare maintenance 05/07/2017  . Onychomycosis 05/07/2017  . Tricompartment osteoarthritis of both knees 02/19/2017  . Hyperlipidemia 11/14/2016  . Rheumatoid arthritis (HCC) 07/08/2008  . OBESITY 05/04/2008  . Essential hypertension 05/04/2008  . ALLERGIC RHINITIS 05/04/2008  . GERD 05/04/2008  . VARICOSE VEINS LOWER EXTREMITIES W/INFLAMMATION  11/02/2007  . Hypothyroidism 08/18/2007    Past Surgical History:  Procedure Laterality Date  . APPENDECTOMY    . CHOLECYSTECTOMY    . ELBOW SURGERY     LEFT  . ENDOSCOPIC VEIN LASER TREATMENT     RIGHT LEG  . EXPLORATORY LAPAROTOMY    . HAND SURGERY     RIGHT   . HEEL SPUR SURGERY     BILAT  . INSERTION OF MESH N/A 08/04/2017   Procedure: INSERTION OF MESH;  Surgeon: Darnell Level, MD;  Location: [facility] OR;  Service: General;  Laterality: N/A;  . partial thyroidectomy  2015  . TONSILLECTOMY    . TUBAL LIGATION    . VENTRAL HERNIA REPAIR  08/04/2017  . VENTRAL HERNIA REPAIR N/A 08/04/2017   Procedure: LAPAROSCOPIC VENTRAL INCISIONAL HERNIA REPAIR WITH MESH;  Surgeon: Darnell Level, MD;  Location: [facility] OR;  Service: General;  Laterality: N/A;     OB History   No obstetric history on file.     Family History  Problem Relation Age of Onset  . Diabetes Mother   . Kidney disease Mother   . Lung cancer Father   . Alzheimer's disease Maternal Grandmother   . Stroke Maternal Grandfather   . Heart disease Paternal Grandmother   . Unexplained death Paternal Grandfather     Social History   Tobacco Use  . Smoking status: Current Every Day Smoker    Packs/day: 0.25  Years: 40.00    Pack years: 10.00    Types: Cigarettes    Last attempt to quit: 02/23/2014    Years since quitting: 6.8  . Smokeless tobacco: Never Used  . Tobacco comment: < 1/2 pack/day  Vaping Use  . Vaping Use: Never used  Substance Use Topics  . Alcohol use: No  . Drug use: No    Home Medications Prior to Admission medications   Medication Sig Start Date End Date Taking? Authorizing Provider  Ascorbic Acid (VITAMIN C) 1000 MG tablet Take 1,000 mg by mouth daily.    [provider]  cholecalciferol (VITAMIN D) 1000 units tablet take 1 tablet by mouth once daily 12/15/17   [provider]  folic acid (FOLVITE) 1 MG tablet TAKE ONE TABLET BY MOUTH ONE TIME DAILY 08/09/19   Levora Dredge,  MD  furosemide (LASIX) 20 MG tablet Take 1 tablet (20 mg total) by mouth daily. 06/23/19   Levora Dredge, MD  HYDROcodone-acetaminophen (NORCO/VICODIN) 5-325 MG tablet Take 1 tablet by mouth every 6 (six) hours as needed for severe pain. 01/11/20   Long, Arlyss Repress, MD  levothyroxine (SYNTHROID, LEVOTHROID) 150 MCG tablet Take 1 tablet (150 mcg total) by mouth daily. 12/02/18 12/02/19  Masoudi, Shawna Orleans, MD  lisinopril (ZESTRIL) 10 MG tablet Take 0.5 tablets (5 mg total) by mouth daily. 06/23/19 06/22/20  Levora Dredge, MD  Multiple Vitamins-Minerals (ONE-A-DAY WOMENS 50+ ADVANTAGE) TABS Take 1 tablet by mouth daily.    [provider]  ondansetron (ZOFRAN ODT) 4 MG disintegrating tablet Take 1 tablet (4 mg total) by mouth every 8 (eight) hours as needed. 01/11/20   Long, Arlyss Repress, MD  ondansetron (ZOFRAN) 8 MG tablet Take 1 tablet (8 mg total) by mouth every 8 (eight) hours as needed for nausea or vomiting. 08/04/18   Molt, Bethany, DO  pantoprazole (PROTONIX) 40 MG tablet TAKE TWO TABLETS BY MOUTH AT BEDTIME 09/28/19   Helberg, Jill Alexanders, MD  potassium chloride (K-DUR) 10 MEQ tablet TAKE 1 TABLET BY MOUTH ONCE DAILY 10/29/18   Barrett, Joline Salt, PA-C  predniSONE (DELTASONE) 5 MG tablet TAKE ONE TABLET BY MOUTH ONE TIME DAILY 06/21/19   Levora Dredge, MD  Rivaroxaban 15 & 20 MG TBPK Follow package directions: Take one  tablet by mouth twice a day. On day 22, switch to one  tablet once a day. Take with food. 01/11/20   Long, Arlyss Repress, MD  senna-docusate (SENOKOT-S) 8.6-50 MG tablet Take 1 tablet by mouth at bedtime as needed for mild constipation or moderate constipation. 01/11/20   Long, Arlyss Repress, MD  simvastatin (ZOCOR) 20 MG tablet Take 1 tablet (20 mg total) by mouth daily at 6 PM. 08/04/18   Molt, Bethany, DO  traMADol (ULTRAM) 50 MG tablet TAKE 1 TABLET ( ) BY MOUTH 3 TIMES DAILY AS NEEDED FOR MODERATE OR SEVERE PAIN 10/29/18   Inez Catalina, MD  TREXALL 7.5 MG tablet TAKE 1 TABLET  BY MOUTH ONCE A WEEK 10/08/18   Tyson Alias, MD    Allergies    Oxycodone-acetaminophen, Penicillins, Latex, Oxycodone-acetaminophen, Tyloxapol, Adhesive [tape], and Strawberry extract  Review of Systems   Review of Systems  Constitutional: Positive for fatigue. Negative for fever.  Respiratory: Positive for cough. Negative for shortness of breath.   Cardiovascular: Negative for chest pain.  Gastrointestinal: Negative for abdominal pain, nausea and vomiting.  Genitourinary: Negative for dysuria and hematuria.  Neurological: Positive for weakness. Negative for headaches.  All other systems reviewed and are  negative.   Physical Exam Updated Vital Signs BP (!) 99/54   Pulse 77   Temp 100.2 F (37.9 C) (Oral)   Resp (!) 21   SpO2 96%   Physical Exam Vitals and nursing note reviewed.  Constitutional:      Appearance: Normal appearance. She is well-developed and well-nourished.  HENT:     Head: Normocephalic and atraumatic.     Mouth/Throat:     Mouth: Oropharynx is clear and moist. Mucous membranes are dry.  Eyes:     General: Lids are normal.     Extraocular Movements: EOM normal.     Conjunctiva/sclera: Conjunctivae normal.     Pupils: Pupils are equal, round, and reactive to light.  Cardiovascular:     Rate and Rhythm: Normal rate and regular rhythm.     Pulses: Normal pulses.     Heart sounds: Normal heart sounds. No murmur heard. No friction rub. No gallop.   Pulmonary:     Effort: Pulmonary effort is normal.     Breath sounds: Rales present.     Comments: Mild wheezing noted in the upper airways. Crackles noted in the right lung fields.  Abdominal:     Palpations: Abdomen is soft. Abdomen is not rigid.     Tenderness: There is no abdominal tenderness. There is no guarding.     Comments: Abdomen is soft, non-distended, non-tender. No rigidity, No guarding. No peritoneal signs.  Musculoskeletal:        General: Normal range of motion.     Cervical back:  Full passive range of motion without pain.  Skin:    General: Skin is warm and dry.     Capillary Refill: Capillary refill takes less than 2 seconds.  Neurological:     Mental Status: She is alert and oriented to person, place, and time.  Psychiatric:        Mood and Affect: Mood and affect normal.        Speech: Speech normal.     ED Results / Procedures / Treatments   Labs (all labs ordered are listed, but only abnormal results are displayed) Labs Reviewed  SARS CORONAVIRUS 2 (TAT 6-24 HRS) - Abnormal; Notable for the following components:      Result Value   SARS Coronavirus 2 POSITIVE (*)    All other components within normal limits  COMPREHENSIVE METABOLIC PANEL - Abnormal; Notable for the following components:   Sodium 133 (*)    CO2 21 (*)    Glucose, Bld 102 (*)    Creatinine, Ser 1.65 (*)    Calcium 8.3 (*)    Albumin 2.8 (*)    GFR, Estimated 35 (*)    All other components within normal limits  CBC WITH DIFFERENTIAL/PLATELET - Abnormal; Notable for the following components:   Platelets 142 (*)    All other components within normal limits  CULTURE, BLOOD (ROUTINE X 2)  CULTURE, BLOOD (ROUTINE X 2)  LACTIC ACID, PLASMA  LACTIC ACID, PLASMA  URINALYSIS, ROUTINE W REFLEX MICROSCOPIC    EKG EKG Interpretation  Date/Time:  Friday January 05 2021 13:20:14 EST Ventricular Rate:  98 PR Interval:  124 QRS Duration: 110 QT Interval:  364 QTC Calculation: 464 R Axis:   -12 Text Interpretation: Sinus rhythm with Premature atrial complexes Incomplete right bundle branch block Borderline ECG Confirmed by Kristine Royal [phone]) on 01/05/2021 1:33:02 PM   Radiology DG Chest Portable 1 View  Result Date: 01/05/2021 CLINICAL DATA:  Cough EXAM: PORTABLE CHEST  1 VIEW COMPARISON:  01/11/2020. FINDINGS: Mild enlargement the cardiac silhouette. No consolidation. Scattered calcified granulomas, better characterized on recent CT chest. No visible pleural effusions or  pneumothorax. No acute osseous abnormality. Thoracic inlet clips. IMPRESSION: 1. No evidence of acute cardiopulmonary disease. 2. Chronic cardiomegaly and central pulmonary vascular congestion. Electronically Signed   By: Feliberto Harts MD   On: 01/05/2021 14:32    Procedures Procedures (including critical care time)  Medications Ordered in ED Medications  sodium chloride 0.9 % bolus 1,000 mL (0 mLs Intravenous Stopped 01/05/21 1555)  sodium chloride 0.9 % bolus 1,000 mL (0 mLs Intravenous Stopped 01/05/21 1759)    ED Course  I have reviewed the triage vital signs and the nursing notes.  Pertinent labs & imaging results that were available during my care of the patient were reviewed by me and considered in my medical decision making (see chart for details).    MDM Rules/Calculators/A&P                          65 year old female who presents for evaluation for SOB, fatigue, and cough. Recent COVID exposure. She has been vaccinated x 3.  She reports she has had diarrhea, decreased appetite, myalgias, cough, shortness of breath.  She she has not really had much of an appetite eaten thing.  On initial arrival, she had low-grade fever of 100.2, tachypnea and hypotension with blood pressure of 86/62.  She has a benign abdominal exam.  She has some mild wheezing and rales noted on the right side of her lung field.  No evidence of respiratory distress.  Suspect this is COVID.  She does appear dry.  I suspect her blood pressure is likely related to volume depletion.  Plan to check labs.  Lactic is normal.  CBC shows no leukocytosis.  CMP shows BUN of 21, creatinine 1.65.  Her records indicate that her normal creatinine is anywhere between 0.9-1.2.  Chest x-ray negative for any acute abnormality.  Patient dropped her O2 sats down to 88% while sitting in the bed.  She was placed on 2 L which improved her.  At this time, given initial hypotension on presentation as well as signs of dehydration,  hypoxia, we will plan to admit.  Discussed with family medicine team who accepts patient for admission.  [name] was evaluated in Emergency Department on 01/05/2021 for the symptoms described in the history of present illness. She was evaluated in the context of the global COVID-19 pandemic, which necessitated consideration that the patient might be at risk for infection with the SARS-CoV-2 virus that causes COVID-19. Institutional protocols and algorithms that pertain to the evaluation of patients at risk for COVID-19 are in a state of rapid change based on information released by regulatory bodies including the CDC and federal and state organizations. These policies and algorithms were followed during the patient's care in the ED.   Portions of this note were generated with Scientist, clinical (histocompatibility and immunogenetics). Dictation errors may occur despite best attempts at proofreading.   Final Clinical Impression(s) / ED Diagnoses Final diagnoses:  Hypoxia  AKI (acute kidney injury) (HCC)  Suspected COVID-19 virus infection    Rx / DC Orders ED Discharge Orders    None       Rosana Hoes 01/05/21 2040    Wynetta Fines, MD 01/06/21 [phone]

## 2021-01-05 NOTE — Hospital Course (Addendum)
[name] is a 65 y.o. female who presented with myalgias and was found to be COVID positive. 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  COVID-19 Patient came in due to increased fatigue.  Tested positive for COVID-19 on 01/04/21. Had minimal O2 requirement given some additional desats to 80's.  Possibly compounded by COPD.  Chest X-Ray shows no signs of Pneumonia.   Normal chest X-Ray and normal LA x 2.  Got 5 days of Remdesevir and Decadron. Lungs remained clear and patient wasa stable on RA at time of discharge.  Myalgia and Discomfort Likely due to COVID.  Could be compounded by Hashimoto's for which patient had stoped taking medications  Patient given Tylenol and home Tramadol for. Patient seen by PT and had defecits, which improved somewhat througfhout stay Improved but not completely resolved by time of D/C.  AKI on CKD3b Patient has fluctuating Creatinine with mutliple hospitalizations over past year.  Cr is 1.65 on admission with GFR 35.   Cr improved to 1.5 Could also be worsening progression on of CKD. Patient presented dehydrated, reports she has not been able to keep much down over the last week or two. Received IVF and was stopped when patient had good oral fluid intake and Kidney function stable at 1.4.  Hashimoto's thyroiditis TSH 53.317 on admission. On Synthroid 150 mcg at home but reports not having taken for a week.  Likely cause of elevation.  Was restarted on admission.

## 2021-01-06 ENCOUNTER — Encounter (HOSPITAL_COMMUNITY): Payer: Self-pay | Admitting: Family Medicine

## 2021-01-06 ENCOUNTER — Other Ambulatory Visit: Payer: Self-pay

## 2021-01-06 DIAGNOSIS — E860 Dehydration: Secondary | ICD-10-CM | POA: Diagnosis present

## 2021-01-06 DIAGNOSIS — Z841 Family history of disorders of kidney and ureter: Secondary | ICD-10-CM | POA: Diagnosis not present

## 2021-01-06 DIAGNOSIS — M170 Bilateral primary osteoarthritis of knee: Secondary | ICD-10-CM | POA: Diagnosis present

## 2021-01-06 DIAGNOSIS — Z148 Genetic carrier of other disease: Secondary | ICD-10-CM | POA: Diagnosis not present

## 2021-01-06 DIAGNOSIS — K219 Gastro-esophageal reflux disease without esophagitis: Secondary | ICD-10-CM | POA: Diagnosis present

## 2021-01-06 DIAGNOSIS — J449 Chronic obstructive pulmonary disease, unspecified: Secondary | ICD-10-CM | POA: Diagnosis not present

## 2021-01-06 DIAGNOSIS — R0902 Hypoxemia: Secondary | ICD-10-CM | POA: Diagnosis present

## 2021-01-06 DIAGNOSIS — N1832 Chronic kidney disease, stage 3b: Secondary | ICD-10-CM | POA: Diagnosis present

## 2021-01-06 DIAGNOSIS — I130 Hypertensive heart and chronic kidney disease with heart failure and stage 1 through stage 4 chronic kidney disease, or unspecified chronic kidney disease: Secondary | ICD-10-CM | POA: Diagnosis present

## 2021-01-06 DIAGNOSIS — I5032 Chronic diastolic (congestive) heart failure: Secondary | ICD-10-CM | POA: Diagnosis present

## 2021-01-06 DIAGNOSIS — J9601 Acute respiratory failure with hypoxia: Secondary | ICD-10-CM | POA: Diagnosis present

## 2021-01-06 DIAGNOSIS — Z79899 Other long term (current) drug therapy: Secondary | ICD-10-CM | POA: Diagnosis not present

## 2021-01-06 DIAGNOSIS — E785 Hyperlipidemia, unspecified: Secondary | ICD-10-CM | POA: Diagnosis present

## 2021-01-06 DIAGNOSIS — E063 Autoimmune thyroiditis: Secondary | ICD-10-CM | POA: Diagnosis present

## 2021-01-06 DIAGNOSIS — Z86711 Personal history of pulmonary embolism: Secondary | ICD-10-CM | POA: Diagnosis not present

## 2021-01-06 DIAGNOSIS — N19 Unspecified kidney failure: Secondary | ICD-10-CM | POA: Diagnosis not present

## 2021-01-06 DIAGNOSIS — Z8249 Family history of ischemic heart disease and other diseases of the circulatory system: Secondary | ICD-10-CM | POA: Diagnosis not present

## 2021-01-06 DIAGNOSIS — F17210 Nicotine dependence, cigarettes, uncomplicated: Secondary | ICD-10-CM | POA: Diagnosis present

## 2021-01-06 DIAGNOSIS — U071 COVID-19: Secondary | ICD-10-CM | POA: Diagnosis present

## 2021-01-06 DIAGNOSIS — Z79890 Hormone replacement therapy (postmenopausal): Secondary | ICD-10-CM | POA: Diagnosis not present

## 2021-01-06 DIAGNOSIS — J439 Emphysema, unspecified: Secondary | ICD-10-CM | POA: Diagnosis present

## 2021-01-06 DIAGNOSIS — N179 Acute kidney failure, unspecified: Secondary | ICD-10-CM | POA: Diagnosis present

## 2021-01-06 DIAGNOSIS — Z20822 Contact with and (suspected) exposure to covid-19: Secondary | ICD-10-CM | POA: Diagnosis not present

## 2021-01-06 DIAGNOSIS — Z7901 Long term (current) use of anticoagulants: Secondary | ICD-10-CM | POA: Diagnosis not present

## 2021-01-06 DIAGNOSIS — I340 Nonrheumatic mitral (valve) insufficiency: Secondary | ICD-10-CM | POA: Diagnosis present

## 2021-01-06 DIAGNOSIS — Z832 Family history of diseases of the blood and blood-forming organs and certain disorders involving the immune mechanism: Secondary | ICD-10-CM | POA: Diagnosis not present

## 2021-01-06 DIAGNOSIS — M069 Rheumatoid arthritis, unspecified: Secondary | ICD-10-CM | POA: Diagnosis present

## 2021-01-06 LAB — BASIC METABOLIC PANEL
Anion gap: 9 (ref 5–15)
BUN: 20 mg/dL (ref 8–23)
CO2: 18 mmol/L — ABNORMAL LOW (ref 22–32)
Calcium: 7.5 mg/dL — ABNORMAL LOW (ref 8.9–10.3)
Chloride: 108 mmol/L (ref 98–111)
Creatinine, Ser: 1.38 mg/dL — ABNORMAL HIGH (ref 0.44–1.00)
GFR, Estimated: 43 mL/min — ABNORMAL LOW (ref >60)
Glucose, Bld: 128 mg/dL — ABNORMAL HIGH (ref 70–99)
Potassium: 3.8 mmol/L (ref 3.5–5.1)
Sodium: 135 mmol/L (ref 135–145)

## 2021-01-06 LAB — URINALYSIS, ROUTINE W REFLEX MICROSCOPIC
Bacteria, UA: NONE SEEN
Bilirubin Urine: NEGATIVE
Glucose, UA: NEGATIVE mg/dL
Hgb urine dipstick: NEGATIVE
Ketones, ur: NEGATIVE mg/dL
Nitrite: NEGATIVE
Protein, ur: >=300 mg/dL — AB
Specific Gravity, Urine: 1.017 (ref 1.005–1.030)
pH: 6 (ref 5.0–8.0)

## 2021-01-06 LAB — HIV ANTIBODY (ROUTINE TESTING W REFLEX): HIV Screen 4th Generation wRfx: NONREACTIVE

## 2021-01-06 LAB — CBC
HCT: 36.2 % (ref 36.0–46.0)
Hemoglobin: 10.8 g/dL — ABNORMAL LOW (ref 12.0–15.0)
MCH: 29.5 pg (ref 26.0–34.0)
MCHC: 29.8 g/dL — ABNORMAL LOW (ref 30.0–36.0)
MCV: 98.9 fL (ref 80.0–100.0)
Platelets: 117 10*3/uL — ABNORMAL LOW (ref 150–400)
RBC: 3.66 MIL/uL — ABNORMAL LOW (ref 3.87–5.11)
RDW: 14.3 % (ref 11.5–15.5)
WBC: 4.6 10*3/uL (ref 4.0–10.5)
nRBC: 0 % (ref 0.0–0.2)

## 2021-01-06 LAB — D-DIMER, QUANTITATIVE: D-Dimer, Quant: 1.83 ug/mL-FEU — ABNORMAL HIGH (ref 0.00–0.50)

## 2021-01-06 LAB — C-REACTIVE PROTEIN: CRP: 5.6 mg/dL — ABNORMAL HIGH (ref <1.0)

## 2021-01-06 MED ORDER — ACETAMINOPHEN 650 MG RE SUPP
650.0000 mg | RECTAL | Status: DC
  Filled 2021-01-06: qty 1

## 2021-01-06 MED ORDER — ACETAMINOPHEN 325 MG PO TABS
650.0000 mg | ORAL_TABLET | ORAL | Status: DC
  Administered 2021-01-06 – 2021-01-10 (×23): 650 mg via ORAL
  Filled 2021-01-06 (×23): qty 2

## 2021-01-06 NOTE — TOC Initial Note (Signed)
Transition of Care [facility]) - Initial/Assessment Note    Patient Details  Name: [name] MRN: [id number] Date of Birth: [birth year]  Transition of Care [facility]) CM/SW Contact:    Lockie Pares, RN Phone Number: 01/06/2021, 7:17 AM  Clinical Narrative:                 Patient in with COVID pneumonia, history of RA, COPD currently on 2LPM. Lives with son Will likely need home oxygen post hospitalization, may need home health. CM will follow for needs  Expected Discharge Plan: Home w Home Health Services Barriers to Discharge: Continued Medical Work up   Patient Goals and CMS Choice        Expected Discharge Plan and Services Expected Discharge Plan: Home w Home Health Services       Living arrangements for the past 2 months: Apartment                                      Prior Living Arrangements/Services Living arrangements for the past 2 months: Apartment Lives with:: Adult Children Patient language and need for interpreter reviewed:: Yes        Need for Family Participation in Patient Care: Yes (Comment) Care giver support system in place?: Yes (comment)   Criminal Activity/Legal Involvement Pertinent to Current Situation/Hospitalization: No - Comment as needed  Activities of Daily Living      Permission Sought/Granted                  Emotional Assessment       Orientation: : Oriented to Self,Oriented to Place,Oriented to  Time,Oriented to Situation Alcohol / Substance Use: Not Applicable Psych Involvement: No (comment)  Admission diagnosis:  COVID-19 [U07.1] Patient Active Problem List   Diagnosis Date Noted  . COVID-19 01/05/2021  . Hypoxia   . Chronic obstructive pulmonary disease (HCC)   . Mitral valve annular calcification 04/07/2018  . Depression 04/07/2018  . Diastolic heart failure (HCC) 02/10/2018  . Family history of hemophilia B 12/02/2017  . Healthcare maintenance 05/07/2017  . Onychomycosis 05/07/2017  .  Tricompartment osteoarthritis of both knees 02/19/2017  . Hyperlipidemia 11/14/2016  . AKI (acute kidney injury) (HCC) 04/23/2015  . Rheumatoid arthritis (HCC) 07/08/2008  . OBESITY 05/04/2008  . Essential hypertension 05/04/2008  . ALLERGIC RHINITIS 05/04/2008  . GERD 05/04/2008  . VARICOSE VEINS LOWER EXTREMITIES W/INFLAMMATION 11/02/2007  . Hypothyroidism 08/18/2007   PCP:  Virgilio Belling, PA-C Pharmacy:   [facility] [address] [place], [place] - [address] [facility] AT [address] [place]. [place] [address] Phone: [phone] Fax: [phone]  [facility] - [place], [place] - [address] [place] [address] Phone: [phone] Fax: [phone]     Social Determinants of Health (SDOH) Interventions    Readmission Risk Interventions No flowsheet data found.

## 2021-01-06 NOTE — Progress Notes (Signed)
Family Medicine Teaching Service Daily Progress Note Intern Pager: [phone]  Patient name: [name] Medical record number: [id number] Date of birth: 1956/09/11 Age: 65 y.o. Gender: female  Primary Care Provider: Virgilio Belling, PA-C Consultants: None Code Status: Full  Pt Overview and Major Events to Date:  Admitted 1/21  Assessment and Plan: [name] is a 65 y.o. female presenting with myalgias. 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  Currently satting well.  Continues to have myalgias, did not get any Tylenol overnight.  Will change to scheduled. - Increase Tylenol to 650 mg q4hr - Continuous CardiacandPulse Ox - Continue Remdesevir & Decadron (day 2) - Wean O2 as tolerated, Keep FiO2 levels >88% - Tylenol 650 mg q6hr - f/u Blood cultures - Vital Signs q4hr  AKI on CKD  Cr- 1.65>1.38.  Will continue to give fluids and hold Lasix - Continue NS IVF 100 ml/hr - Avoid Renal Toxic medications - Daily BMP - Hold home Lasix  Hypertension SBP ranging from 90's-110's. On Lasix 20 mg at home. - Hold home Lasix - Continue IV NS 100 mL/hr  COPD Quit smoking over 1 year ago.  Was previously smoking 2 packs daily for 40 years or so.  Does not have an oxygen requirement at home.  No significant sputum production.  Less likely having an exacerbation. - Consider Low dose CT per USPSTF guidelines  Rheumatoid arthritis Patient with significant findings in hands on PE for RA.  Patient knows she takes Tramadol 50 mg q8hr PRN and Prednisone 5mg  daily. Unsure about other medications. Records from [facility] also indicate she is Arava 10 MG by mouth daily. M-F, off on Sat and 5 mg on Sun and Vit D supplement daily. - Continue home Arava - Hold home Tramadol - Hold home  Prednisone while receiving Decadron  History of PE Patient indicates she has history of PE that occurred between 1-5 years ago for which she takes Xarelto.  Per [facility] med history from November, patient taking Xarelto 20 mg nightly.   - Continue home Xarelto  Hashimoto's thyroiditis TSH this morning is 53.317. On Synthroid 150 mcg at home but reports not having taken for a week.  Likely cause of elevation. -Continue home Synthroid  - f/u TSH outpatient  Hyperlipidemia Lipid panel on 03/13/2020 showed LDL of 63.  On simvastatin 20 mg at home.  -Continue home simvastatin  GERD On Protonix 40 mg BID.  - Hold Home Protonix  Mild Mitral Regurg ECHO in 03/201 showed Severely calcified annulus. Moderately thickened, moderately calcified leaflets . There was mild regurgitation.  EF 60-65%. - Hold home lasix at this time  FEN/GI: Full Diet Prophylaxis: Xarelto  Status is: Observation  The patient remains OBS appropriate and will d/c before 2 midnights.  Dispo: The patient is from: Home              Anticipated d/c is to: Home              Anticipated d/c date is: 1 day              Patient currently is not medically stable to d/c.   Difficult to place patient No   Subjective:  Patient still complaining oif myalgias.  Indicates pain is the worst in her back and legs.  Objective: Temp:  [  98.1 F (36.7 C)-100.2 F (37.9 C)] 98.2 F (36.8 C) (01/22 0345) Pulse Rate:  [61-96] 75 (01/22 0845) Resp:  [15-29] 26 (01/22 0845) BP: (81-115)/(50-101) 91/56 (01/22 0845) SpO2:  [88 %-100 %] 94 % (01/22 0845) Physical Exam:  Constitutional:      General: She is not in acute distress. HENT:     Head: Normocephalic and atraumatic.     Mouth/Throat:     Mouth: Mucous membranes are moist.  Pulmonary:     Effort: Pulmonary effort is normal.     Breath sounds: Rales present.     Comments: Lung sounds improved, good air movement Musculoskeletal:     Right hand: Deformity present.  Decreased range of motion. Normal pulse.     Left hand: Deformity present. Decreased range of motion. Normal pulse.     Right lower leg: No edema.     Left lower leg: No edema.     Comments: Has significant Rheumatoid nodules with boutonneire deformity on hand bilaterally Non-pitting edema bilaterally in legs with apparent venous statsis in right leg  Neurological:     Mental Status: She is alert.    Laboratory: Recent Labs  Lab 01/05/21 1427 01/06/21 0237  WBC 5.9 4.6  HGB 12.3 10.8*  HCT 39.9 36.2  PLT 142* 117*   Recent Labs  Lab 01/05/21 1427 01/06/21 0237  NA 133* 135  K 3.7 3.8  CL 100 108  CO2 21* 18*  BUN 21 20  CREATININE 1.65* 1.38*  CALCIUM 8.3* 7.5*  PROT 6.7  --   BILITOT 1.0  --   ALKPHOS 86  --   ALT 22  --   AST 41  --   GLUCOSE 102* 128*    CRP- 6.5>5.6 D-Dimer- 1.91>1.83  Imaging/Diagnostic Tests: No new imaging  Jovita Kussmaul, MD 01/06/2021, 10:03 AM PGY-1, [facility] Family Medicine FPTS Intern pager: [phone], text pages welcome

## 2021-01-06 NOTE — Progress Notes (Signed)
Occupational Therapy Evaluation Patient Details Name: [name] MRN: [id number] DOB: 1956/04/16 Today's Date: 01/06/2021    History of Present Illness Pt is a 65 y/o female admitted secondary to fatigue and myalgia. Pt found to be covid +. PMH includes HTN, COPD, CKD, RA, PE, and Hashimotos.   Clinical Impression   Pt admitted with above. She demonstrates the below listed deficits and will benefit from continued OT to maximize safety and independence with BADLs. Pt presents to OT with generalized weakness, impaired balance, decreased activity tolerance.  She currently requires set up assist to max A for ADLs and min A +2 for functional transfers.  She fatigues quickly with activity.  Sp02 94% on 2L supplemental 02.  Pt reports she lives with her son, who, she reports is not in good health and can assist occasionally.  Based on today's performance, recommend SNF, but may progress more quickly than anticipated.       Follow Up Recommendations  SNF;Supervision/Assistance - 24 hour (depending on progress)    Equipment Recommendations  None recommended by OT    Recommendations for Other Services       Precautions / Restrictions Precautions Precautions: Fall Restrictions Weight Bearing Restrictions: No      Mobility Bed Mobility Overal bed mobility: Needs Assistance Bed Mobility: Supine to Sit;Sit to Supine     Supine to sit: Min assist;+2 for safety/equipment Sit to supine: Min assist;+2 for safety/equipment   General bed mobility comments: Increased time and effort required. Pt requiring seated rest secondary to fatigue.    Transfers Overall transfer level: Needs assistance Equipment used: 2 person hand held assist Transfers: Sit to/from Stand Sit to Stand: Min assist;+2 safety/equipment;+2 physical assistance         General transfer comment: Min A for steadying assist to stand from stretcher.    Balance Overall balance assessment: Needs  assistance Sitting-balance support: No upper extremity supported;Feet supported Sitting balance-Leahy Scale: Fair     Standing balance support: Bilateral upper extremity supported;During functional activity Standing balance-Leahy Scale: Poor Standing balance comment: Reliant on UE support                           ADL either performed or assessed with clinical judgement   ADL Overall ADL's : Needs assistance/impaired Eating/Feeding: Set up;Bed level   Grooming: Wash/dry hands;Wash/dry face;Oral care;Set up;Bed level;Sitting   Upper Body Bathing: Minimal assistance;Sitting   Lower Body Bathing: Sit to/from stand;Moderate assistance   Upper Body Dressing : Minimal assistance;Sitting   Lower Body Dressing: Maximal assistance;Sit to/from stand   Toilet Transfer: Minimal assistance;+2 for physical assistance;+2 for safety/equipment;Stand-pivot;BSC   Toileting- Clothing Manipulation and Hygiene: Moderate assistance;Sit to/from stand       Functional mobility during ADLs: Minimal assistance;+2 for physical assistance;+2 for safety/equipment General ADL Comments: pt fatigues quickly with activity     Vision         Perception     Praxis      Pertinent Vitals/Pain Pain Assessment: 0-10 Pain Score: 7  Pain Location: generalized Pain Descriptors / Indicators: Guarding;Grimacing Pain Intervention(s): Limited activity within patient's tolerance;Monitored during session;Repositioned     Hand Dominance Right   Extremity/Trunk Assessment Upper Extremity Assessment Upper Extremity Assessment: Generalized weakness   Lower Extremity Assessment Lower Extremity Assessment: Defer to PT evaluation   Cervical / Trunk Assessment Cervical / Trunk Assessment: Other exceptions Cervical / Trunk Exceptions: increased body habitus   Communication Communication Communication: No difficulties  Cognition Arousal/Alertness: Awake/alert Behavior During Therapy: WFL for  tasks assessed/performed Overall Cognitive Status: No family/caregiver present to determine baseline cognitive functioning                                 General Comments: Pt a bit slow to process info   General Comments  Sp02 94% on 2L; HR 75; DOE 3/4.  BP supine 105/70; sitting 104/79; standing 113/72.    Exercises     Shoulder Instructions      Home Living Family/patient expects to be discharged to:: Private residence Living Arrangements: Children Available Help at Discharge: Family Type of Home: Apartment Home Access: Ramped entrance     Home Layout: Two level Alternate Level Stairs-Number of Steps: 13 Alternate Level Stairs-Rails: Right Bathroom Shower/Tub: Chief Strategy Officer: Standard     Home Equipment: TEFL teacher Comments: lives with son Who has "lots of issues"  he does not work, but able to help her "sometimes      Prior Functioning/Environment Level of Independence: Independent        Comments: Drives, grocery shops, cooks and cleans        OT Problem List: Decreased strength;Decreased activity tolerance;Impaired balance (sitting and/or standing);Decreased knowledge of use of DME or AE;Cardiopulmonary status limiting activity;Pain      OT Treatment/Interventions: Self-care/ADL training;Therapeutic exercise;DME and/or AE instruction;Therapeutic activities;Cognitive remediation/compensation;Patient/family education;Balance training    OT Goals(Current goals can be found in the care plan section) Acute Rehab OT Goals Patient Stated Goal: to feel better OT Goal Formulation: With patient Time For Goal Achievement: 01/18/21 Potential to Achieve Goals: Good ADL Goals Pt Will Perform Grooming: with min guard assist;standing Pt Will Perform Upper Body Bathing: with set-up;with supervision;sitting Pt Will Perform Lower Body Bathing: with min guard assist;sit to/from stand Pt Will Perform Upper Body Dressing: with  set-up;with supervision;sitting Pt Will Transfer to Toilet: with min guard assist;ambulating;regular height toilet;bedside commode;grab bars Pt Will Perform Toileting - Clothing Manipulation and hygiene: with min guard assist;sit to/from stand Pt/caregiver will Perform Home Exercise Program: Increased strength;Right Upper extremity;Left upper extremity;With theraband;With written HEP provided;With Supervision  OT Frequency: Min 2X/week   Barriers to D/C: Decreased caregiver support  pt reports son not in good health and only able to assist some       Co-evaluation PT/OT/SLP Co-Evaluation/Treatment: Yes Reason for Co-Treatment: For patient/therapist safety;To address functional/ADL transfers PT goals addressed during session: Mobility/safety with mobility;Balance OT goals addressed during session: ADL's and self-care      AM-PAC OT "6 Clicks" Daily Activity     Outcome Measure Help from another person eating meals?: None Help from another person taking care of personal grooming?: A Little Help from another person toileting, which includes using toliet, bedpan, or urinal?: A Lot Help from another person bathing (including washing, rinsing, drying)?: A Lot Help from another person to put on and taking off regular upper body clothing?: A Lot Help from another person to put on and taking off regular lower body clothing?: A Lot 6 Click Score: 15   End of Session Equipment Utilized During Treatment: Oxygen Nurse Communication: Mobility status  Activity Tolerance: Patient limited by fatigue Patient left: in bed;with call bell/phone within reach  OT Visit Diagnosis: Unsteadiness on feet (R26.81)                Time: 5176-1607 OT Time Calculation (min): 20 min Charges:  OT General  Charges $OT Visit: 1 Visit OT Evaluation $OT Eval Moderate Complexity: 1 Mod  Eber Jones., OTR/L Acute Rehabilitation Services Pager [phone] Office [phone]   Jeani Hawking M 01/06/2021, 2:52  PM

## 2021-01-06 NOTE — Evaluation (Signed)
Physical Therapy Evaluation Patient Details Name: [name] MRN: [id number] DOB: 1956-05-12 Today's Date: 01/06/2021   History of Present Illness  Pt is a 65 y/o female admitted secondary to fatigue and myalgia. Pt found to be covid +. PMH includes HTN, COPD, CKD, RA, PE, and Hashimotos.  Clinical Impression  Pt admitted secondary to problem above with deficits below. Pt fatiguing very easily and with poor activity tolerance. Pt requiring min A +2 for very short distance ambulation. Pt has a flight of steps and has reported difficulty performing since becoming sick. Feel at this time she would benefit from ST SNF stay at d/c. However, if pt progresses well, may be able to d/c home with HHPT and family support. Will continue to follow acutely.     Follow Up Recommendations SNF    Equipment Recommendations  Rolling walker with 5" wheels    Recommendations for Other Services       Precautions / Restrictions Precautions Precautions: Fall Restrictions Weight Bearing Restrictions: No      Mobility  Bed Mobility Overal bed mobility: Needs Assistance Bed Mobility: Supine to Sit;Sit to Supine     Supine to sit: Min assist;+2 for safety/equipment Sit to supine: Min assist;+2 for safety/equipment   General bed mobility comments: Increased time and effort required. Pt requiring seated rest secondary to fatigue.    Transfers Overall transfer level: Needs assistance Equipment used: 2 person hand held assist Transfers: Sit to/from Stand Sit to Stand: Min assist;+2 safety/equipment;+2 physical assistance         General transfer comment: Min A for steadying assist to stand from stretcher.  Ambulation/Gait Ambulation/Gait assistance: Min assist;Min guard;+2 safety/equipment Gait Distance (Feet): 3 Feet Assistive device: 2 person hand held assist Gait Pattern/deviations: Step-through pattern;Decreased stride length Gait velocity: Decreased   General Gait Details: Very  guarded gait taking steps to/from bed. Pt fatiguing very easy. Requiring min to min guard A for steadying.  Stairs            Wheelchair Mobility    Modified Rankin (Stroke Patients Only)       Balance Overall balance assessment: Needs assistance Sitting-balance support: No upper extremity supported;Feet supported Sitting balance-Leahy Scale: Fair     Standing balance support: Bilateral upper extremity supported;During functional activity Standing balance-Leahy Scale: Poor Standing balance comment: Reliant on UE support                             Pertinent Vitals/Pain Pain Assessment: 0-10 Pain Score: 7  Pain Location: generalized Pain Descriptors / Indicators: Guarding;Grimacing Pain Intervention(s): Limited activity within patient's tolerance;Monitored during session;Repositioned    Home Living Family/patient expects to be discharged to:: Private residence Living Arrangements: Children Available Help at Discharge: Family Type of Home: Apartment Home Access: Ramped entrance     Home Layout: Two level Home Equipment: Environmental education officer Comments: lives with son Who has "lots of issues"  he does not work, but able to help her "sometimes    Prior Function Level of Independence: Independent         Comments: Drives, grocery shops, cooks and Conservation officer, historic buildings Dominance   Dominant Hand: Right    Extremity/Trunk Assessment   Upper Extremity Assessment Upper Extremity Assessment: Defer to OT evaluation    Lower Extremity Assessment Lower Extremity Assessment: Generalized weakness    Cervical / Trunk Assessment Cervical / Trunk Assessment: Other exceptions Cervical / Trunk Exceptions: increased body  habitus  Communication   Communication: No difficulties  Cognition Arousal/Alertness: Awake/alert Behavior During Therapy: WFL for tasks assessed/performed Overall Cognitive Status: No family/caregiver present to determine baseline  cognitive functioning                                        General Comments General comments (skin integrity, edema, etc.): Sp02 94% on 2L; HR 75; DOE 3/4.  BP supine 105/70; sitting 104/79; standing 113/72.    Exercises     Assessment/Plan    PT Assessment Patient needs continued PT services  PT Problem List Decreased strength;Decreased balance;Decreased mobility;Decreased activity tolerance;Decreased knowledge of use of DME;Cardiopulmonary status limiting activity       PT Treatment Interventions Stair training;Gait training;DME instruction;Functional mobility training;Balance training;Therapeutic exercise;Therapeutic activities;Patient/family education    PT Goals (Current goals can be found in the Care Plan section)  Acute Rehab PT Goals Patient Stated Goal: to feel better PT Goal Formulation: With patient Time For Goal Achievement: 01/20/21 Potential to Achieve Goals: Fair    Frequency Min 3X/week   Barriers to discharge        Co-evaluation PT/OT/SLP Co-Evaluation/Treatment: Yes Reason for Co-Treatment: For patient/therapist safety;To address functional/ADL transfers PT goals addressed during session: Mobility/safety with mobility;Balance         AM-PAC PT "6 Clicks" Mobility  Outcome Measure Help needed turning from your back to your side while in a flat bed without using bedrails?: A Little Help needed moving from lying on your back to sitting on the side of a flat bed without using bedrails?: A Little Help needed moving to and from a bed to a chair (including a wheelchair)?: A Little Help needed standing up from a chair using your arms (e.g., wheelchair or bedside chair)?: A Little Help needed to walk in hospital room?: A Little Help needed climbing 3-5 steps with a railing? : A Lot 6 Click Score: 17    End of Session Equipment Utilized During Treatment: Oxygen Activity Tolerance: Patient limited by fatigue Patient left: in bed;with call  bell/phone within reach (on stretcher in ED) Nurse Communication: Mobility status PT Visit Diagnosis: Muscle weakness (generalized) (M62.81);Unsteadiness on feet (R26.81)    Time: 4103-0131 PT Time Calculation (min) (ACUTE ONLY): 23 min   Charges:   PT Evaluation $PT Eval Moderate Complexity: 1 Mod          Farley Ly, PT, DPT  Acute Rehabilitation Services  Pager: [phone] Office: [phone]   Lehman Prom 01/06/2021, 1:44 PM

## 2021-01-06 NOTE — ED Notes (Signed)
Attempt made to give report to 2W and RN told the bed is no accepted by the unit yet, Clydie Braun, ED CN notified.

## 2021-01-07 DIAGNOSIS — N19 Unspecified kidney failure: Secondary | ICD-10-CM | POA: Diagnosis not present

## 2021-01-07 DIAGNOSIS — Z20822 Contact with and (suspected) exposure to covid-19: Secondary | ICD-10-CM

## 2021-01-07 DIAGNOSIS — R0902 Hypoxemia: Secondary | ICD-10-CM | POA: Diagnosis not present

## 2021-01-07 LAB — COMPREHENSIVE METABOLIC PANEL
ALT: 15 U/L (ref 0–44)
AST: 21 U/L (ref 15–41)
Albumin: 2.1 g/dL — ABNORMAL LOW (ref 3.5–5.0)
Alkaline Phosphatase: 71 U/L (ref 38–126)
Anion gap: 7 (ref 5–15)
BUN: 28 mg/dL — ABNORMAL HIGH (ref 8–23)
CO2: 22 mmol/L (ref 22–32)
Calcium: 7.8 mg/dL — ABNORMAL LOW (ref 8.9–10.3)
Chloride: 112 mmol/L — ABNORMAL HIGH (ref 98–111)
Creatinine, Ser: 1.36 mg/dL — ABNORMAL HIGH (ref 0.44–1.00)
GFR, Estimated: 44 mL/min — ABNORMAL LOW (ref >60)
Glucose, Bld: 147 mg/dL — ABNORMAL HIGH (ref 70–99)
Potassium: 4 mmol/L (ref 3.5–5.1)
Sodium: 141 mmol/L (ref 135–145)
Total Bilirubin: 0.7 mg/dL (ref 0.3–1.2)
Total Protein: 5.2 g/dL — ABNORMAL LOW (ref 6.5–8.1)

## 2021-01-07 LAB — CBC
HCT: 34.2 % — ABNORMAL LOW (ref 36.0–46.0)
Hemoglobin: 10.6 g/dL — ABNORMAL LOW (ref 12.0–15.0)
MCH: 30.1 pg (ref 26.0–34.0)
MCHC: 31 g/dL (ref 30.0–36.0)
MCV: 97.2 fL (ref 80.0–100.0)
Platelets: 132 10*3/uL — ABNORMAL LOW (ref 150–400)
RBC: 3.52 MIL/uL — ABNORMAL LOW (ref 3.87–5.11)
RDW: 14.1 % (ref 11.5–15.5)
WBC: 3.6 10*3/uL — ABNORMAL LOW (ref 4.0–10.5)
nRBC: 0 % (ref 0.0–0.2)

## 2021-01-07 LAB — C-REACTIVE PROTEIN: CRP: 3.6 mg/dL — ABNORMAL HIGH (ref <1.0)

## 2021-01-07 NOTE — NC FL2 (Signed)
[place] MEDICAID FL2 LEVEL OF CARE SCREENING TOOL     IDENTIFICATION  Patient Name: [name] Birthdate: 04-16-1956 Sex: female Admission Date (Current Location): 01/05/2021  [facility] and [place] Number:  Producer, television/film/video and Address:  [facility] [place], [address] [place], [place] [address]      Provider Number: [id number]  Attending Physician Name and Address:  Doreene Eland, MD  Relative Name and Phone Number:  Roseanne Kaufman   [phone]    Current Level of Care: Hospital Recommended Level of Care: Skilled Nursing Facility Prior Approval Number:    Date Approved/Denied:   PASRR Number: [id number]  Discharge Plan: SNF    Current Diagnoses: Patient Active Problem List   Diagnosis Date Noted  . COVID-19 01/05/2021  . Hypoxia   . Chronic obstructive pulmonary disease (HCC)   . Mitral valve annular calcification 04/07/2018  . Depression 04/07/2018  . Diastolic heart failure (HCC) 02/10/2018  . Family history of hemophilia B 12/02/2017  . Healthcare maintenance 05/07/2017  . Onychomycosis 05/07/2017  . Tricompartment osteoarthritis of both knees 02/19/2017  . Hyperlipidemia 11/14/2016  . AKI (acute kidney injury) (HCC) 04/23/2015  . Rheumatoid arthritis (HCC) 07/08/2008  . OBESITY 05/04/2008  . Essential hypertension 05/04/2008  . ALLERGIC RHINITIS 05/04/2008  . GERD 05/04/2008  . VARICOSE VEINS LOWER EXTREMITIES W/INFLAMMATION 11/02/2007  . Hypothyroidism 08/18/2007    Orientation RESPIRATION BLADDER Height & Weight     Self,Time,Situation,Place  Normal Continent Weight:   Height:     BEHAVIORAL SYMPTOMS/MOOD NEUROLOGICAL BOWEL NUTRITION STATUS      Continent Diet (regular diet, see discharge summary)  AMBULATORY STATUS COMMUNICATION OF NEEDS Skin   Limited Assist Verbally Normal                       Personal Care Assistance Level of Assistance  Bathing,Feeding,Dressing Bathing Assistance: Limited  assistance Feeding assistance: Independent Dressing Assistance: Maximum assistance     Functional Limitations Info  Sight,Hearing,Speech Sight Info: Adequate Hearing Info: Adequate Speech Info: Adequate    SPECIAL CARE FACTORS FREQUENCY  PT (By licensed PT),OT (By licensed OT)     PT Frequency: 5x week OT Frequency: 5x week            Contractures Contractures Info: Not present    Additional Factors Info  Code Status,Allergies Code Status Info: full Allergies Info: Oxycodone-acetaminophen, Penicillins, Latex, Oxycodone-acetaminophen, Tyloxapol, Adhesive (Tape), Strawberry Extract           Current Medications (01/07/2021):  This is the current hospital active medication list Current Facility-Administered Medications  Medication Dose Route Frequency Provider Last Rate Last Admin  . acetaminophen (TYLENOL) tablet 650 mg  650 mg Oral Q4H Maness, Philip, MD   650 mg at 01/07/21 4034   Or  . acetaminophen (TYLENOL) suppository 650 mg  650 mg Rectal Q4H Maness, Philip, MD      . dexamethasone (DECADRON) tablet 6 mg  6 mg Oral Q24H Maness, Philip, MD   6 mg at 01/06/21 2018  . [START ON 01/08/2021] leflunomide (ARAVA) tablet 10 mg  10 mg Oral Once per day on Mon Tue Wed Thu Fri Jovita Kussmaul, MD      . levothyroxine (SYNTHROID) tablet 150 mcg  150 mcg Oral Daily Jovita Kussmaul, MD   150 mcg at 01/07/21 7425  . polyethylene glycol (MIRALAX / GLYCOLAX) packet 17 g  17 g Oral Daily PRN Jovita Kussmaul, MD      .  remdesivir 100 mg in sodium chloride 0.9 % 100 mL IVPB  100 mg Intravenous Daily Maness, Philip, MD 200 mL/hr at 01/07/21 0851 100 mg at 01/07/21 0851  . rivaroxaban (XARELTO) tablet 20 mg  20 mg Oral Q supper Jovita Kussmaul, MD   20 mg at 01/06/21 1802  . simvastatin (ZOCOR) tablet 20 mg  20 mg Oral q1800 Jovita Kussmaul, MD   20 mg at 01/06/21 8811     Discharge Medications: Please see discharge summary for a list of discharge medications.  Relevant Imaging  Results:  Relevant Lab Results:   Additional Information SSN: [id number]  Lorri Frederick, LCSW

## 2021-01-07 NOTE — Progress Notes (Signed)
Family Medicine Teaching Service Daily Progress Note Intern Pager: [phone]  Patient name: [name] Medical record number: [id number] Date of birth: 26-Aug-1956 Age: 65 y.o. Gender: female  Primary Care Provider: Virgilio Belling, PA-C Consultants: None Code Status: FULL  Pt Overview and Major Events to Date:  1/21: admitted  Assessment and Plan:  Analyssa Downs [name] a 65 y.o.femalewho presented with myalgias and was found to be COVID positive. 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  Acute Hypoxemic Respiratory Failure 2/2 COVID-19  Myalgias Patient stable on room air this morning and denies respiratory complaints. Reports her myalgias are improved but still present. CRP downtrending. PT/OT recommended SNF and patient is agreeable. -Tylenol 650mg  q4h -Continue Remdesevir (1/21- ) and Decadron (1/21- ) -Monitor respiratory status -Ongoing PT/OT -Appreciate TOC assistance with SNF placement  AKI on CKD Stage IIIa Cr 1.36 with est GFR 44 this morning (baseline GFR ~50). Has been receiving mIVF (NS @ 150mL/hr). Patient states she has been eating and drinking better. Feels she would be able to maintain hydration with PO intake if we d/c'd IV fluids. -Will d/c IV fluids -Holding home Lasix -Daily BMP  Hypertension BP has been on lower side for past 24h. Systolic 90-125. On Lasix 20 mg at home. - Holding home Lasix - Continue to monitor BP - Encourage PO fluid intake  COPD Chronic, stable. Not in acute exacerbation. Initial respiratory symptoms thought to be due to COVID and are now resolved. Former smoker with >40 pack year hx. - Consider outpatient low dose CT per USPSTF guidelines  Rheumatoid arthritis Chronic, stable. Home meds include: Tramadol 50 mg q8hr PRN, Prednisone 5mg daily, Arava10mg  by mouth M-F, off on Sat and5 mg onSun, and Vit D  supplement daily. - Continue home Arava - Hold home Tramadol - Hold home Prednisone while receiving Decadron  Hashimoto's thyroiditis TSH 53.317 on admission. On Synthroid 150 mcg at home but reports not having taken for a week.  Likely cause of elevation. -Continue home Synthroid  - f/u TSH outpatient  History of PE Patient indicates she has history of PE that occurred between 1-5 years ago for which she takes Xarelto 20mg  nightly.  - Continue home Xarelto  Hyperlipidemia Chronic, stable. Lipid panel on 03/13/2020 showed LDL of 63. On simvastatin 20 mg at home.  -Continue home simvastatin  GERD Chronic, stable. On Protonix40mg  BID at home. -Holding home Protonix for now  MildMitral Regurg Chronic, stable. Echo in 02/2018 showedEF 60-65% with mild mitral regurg but severely calcified annulus and moderately thickened, moderately calcified leaflets. - Hold home lasix at this time   FEN/GI: Regular diet PPx: on Xarelto   Status is: Inpatient Remains inpatient appropriate because:awaiting SNF placement   Dispo: The patient is from: Home              Anticipated d/c is to: SNF              Anticipated d/c date is: 2 days              Patient currently is medically stable to d/c.    Subjective:  No acute events overnight. Patient states she's feeling much better. Denies respiratory complaints. Still with myalgias but they are improved from prior. Discussed PT recommendation of SNF and patient is agreeable.  Objective: Temp:  [97.8 F (36.6 C)-98.6 F (37 C)] 98 F (36.7 C) (01/23 0538) Pulse Rate:  [68-85] 85 (01/23 0538) Resp:  [  15-29] 20 (01/23 0538) BP: (89-125)/(50-86) 120/79 (01/23 0538) SpO2:  [91 %-97 %] 96 % (01/23 0538) Physical Exam: General: alert, resting comfortably in bed Cardiovascular: RRR, normal S1/S2 without m/r/g Respiratory: normal WOB on room air, lungs CTAB Abdomen: soft, nontender, nondistended Extremities: nonpitting edema  bilaterally. Venous stasis changes of bilateral lower extremities.   Laboratory: Recent Labs  Lab 01/05/21 1427 01/06/21 0237 01/07/21 0332  WBC 5.9 4.6 3.6*  HGB 12.3 10.8* 10.6*  HCT 39.9 36.2 34.2*  PLT 142* 117* 132*   Recent Labs  Lab 01/05/21 1427 01/06/21 0237 01/07/21 0332  NA 133* 135 141  K 3.7 3.8 4.0  CL 100 108 112*  CO2 21* 18* 22  BUN 21 20 28*  CREATININE 1.65* 1.38* 1.36*  CALCIUM 8.3* 7.5* 7.8*  PROT 6.7  --  5.2*  BILITOT 1.0  --  0.7  ALKPHOS 86  --  71  ALT 22  --  15  AST 41  --  21  GLUCOSE 102* 128* 147*   CRP 3.6  Imaging/Diagnostic Tests: No new imaging in past 24h.   Maury Dus, MD 01/07/2021, 7:24 AM PGY-1, [facility] Family Medicine FPTS Intern pager: [phone], text pages welcome

## 2021-01-07 NOTE — TOC Progression Note (Signed)
Transition of Care [facility]) - Progression Note    Patient Details  Name: [name] MRN: [id number] Date of Birth: 05-22-1956  Transition of [facility]) CM/SW Contact  Lorri Frederick, LCSW Phone Number: 01/07/2021, 2:43 PM  Clinical Narrative:   CSW spoke with pt by phone regarding SNF recommendation.  Pt agreeable to this, discussed choice with covid facilities.    Expected Discharge Plan: Home w Home Health Services Barriers to Discharge: Continued Medical Work up  Expected Discharge Plan and Services Expected Discharge Plan: Home w Home Health Services       Living arrangements for the past 2 months: Apartment                                       Social Determinants of Health (SDOH) Interventions    Readmission Risk Interventions No flowsheet data found.

## 2021-01-08 DIAGNOSIS — R0902 Hypoxemia: Secondary | ICD-10-CM | POA: Diagnosis not present

## 2021-01-08 DIAGNOSIS — U071 COVID-19: Secondary | ICD-10-CM | POA: Diagnosis not present

## 2021-01-08 DIAGNOSIS — N179 Acute kidney failure, unspecified: Secondary | ICD-10-CM | POA: Diagnosis not present

## 2021-01-08 LAB — COMPREHENSIVE METABOLIC PANEL
ALT: 20 U/L (ref 0–44)
AST: 27 U/L (ref 15–41)
Albumin: 2.1 g/dL — ABNORMAL LOW (ref 3.5–5.0)
Alkaline Phosphatase: 66 U/L (ref 38–126)
Anion gap: 7 (ref 5–15)
BUN: 35 mg/dL — ABNORMAL HIGH (ref 8–23)
CO2: 21 mmol/L — ABNORMAL LOW (ref 22–32)
Calcium: 8.3 mg/dL — ABNORMAL LOW (ref 8.9–10.3)
Chloride: 109 mmol/L (ref 98–111)
Creatinine, Ser: 1.48 mg/dL — ABNORMAL HIGH (ref 0.44–1.00)
GFR, Estimated: 40 mL/min — ABNORMAL LOW (ref >60)
Glucose, Bld: 138 mg/dL — ABNORMAL HIGH (ref 70–99)
Potassium: 4.2 mmol/L (ref 3.5–5.1)
Sodium: 137 mmol/L (ref 135–145)
Total Bilirubin: 0.5 mg/dL (ref 0.3–1.2)
Total Protein: 5.1 g/dL — ABNORMAL LOW (ref 6.5–8.1)

## 2021-01-08 LAB — CBC
HCT: 34.7 % — ABNORMAL LOW (ref 36.0–46.0)
Hemoglobin: 10.8 g/dL — ABNORMAL LOW (ref 12.0–15.0)
MCH: 30 pg (ref 26.0–34.0)
MCHC: 31.1 g/dL (ref 30.0–36.0)
MCV: 96.4 fL (ref 80.0–100.0)
Platelets: 168 10*3/uL (ref 150–400)
RBC: 3.6 MIL/uL — ABNORMAL LOW (ref 3.87–5.11)
RDW: 13.9 % (ref 11.5–15.5)
WBC: 5.9 10*3/uL (ref 4.0–10.5)
nRBC: 0 % (ref 0.0–0.2)

## 2021-01-08 LAB — C-REACTIVE PROTEIN: CRP: 1.5 mg/dL — ABNORMAL HIGH (ref <1.0)

## 2021-01-08 MED ORDER — MELATONIN 3 MG PO TABS
3.0000 mg | ORAL_TABLET | Freq: Every evening | ORAL | Status: DC | PRN
  Administered 2021-01-08: 3 mg via ORAL
  Filled 2021-01-08: qty 1

## 2021-01-08 MED ORDER — SODIUM CHLORIDE 0.9 % IV SOLN: INTRAVENOUS | Status: AC

## 2021-01-08 MED ORDER — ALUM & MAG HYDROXIDE-SIMETH 200-200-20 MG/5ML PO SUSP
15.0000 mL | ORAL | Status: DC | PRN
  Filled 2021-01-08: qty 30

## 2021-01-08 MED ORDER — ALUM & MAG HYDROXIDE-SIMETH 200-200-20 MG/5ML PO SUSP
30.0000 mL | ORAL | Status: DC | PRN
  Administered 2021-01-08 (×2): 30 mL via ORAL
  Filled 2021-01-08: qty 30

## 2021-01-08 NOTE — Progress Notes (Signed)
Family Medicine Teaching Service Daily Progress Note Intern Pager: [phone]  Patient name: [name] Medical record number: [id number] Date of birth: 1956/10/13 Age: 66 y.o. Gender: female  Primary Care Provider: Virgilio Belling, PA-C Consultants: None Code Status: Full  Pt Overview and Major Events to Date:  1/21: Admitted  Assessment and Plan:  Marilyn Grieshop Pruittis a 65 y.o.femalewho presented with myalgias and was found to be COVID positive. 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  COVID-19  Myalgias Stable on room air this morning.  Lungs CTAB. Still complaining of significant muscle pain causing discomfort and issues with sleep. -Tylenol 650 mg q4h -Continue Remdesivir (day 4/5) -Continue Decadron (day 4/10) -PT/OT recommend SNF, [patient ammenable. -Appreciate TOC assistance with SNF placement  CKD stage IIIa Creatinine 1.48 this morning.  Patient also reporting  -Daily BMP -Restart IV fluids at 75 mL/hr -Holding home Lasix  Hypertension BP has been on lower side for past 24h. Systolic 90-125. On Lasix 20 mg at home. - Holding home Lasix - Continue to monitor BP - Encourage PO fluid intake  COPD Chronic, stable. Not in acute exacerbation. Initial respiratory symptoms thought to be due to COVID and are now resolved. Former smoker with >40 pack year hx. - Consider outpatient low dose CT per USPSTF guidelines  Rheumatoid arthritis Chronic, stable. Home meds include: Tramadol 50 mg q8hr PRN, Prednisone 5mg daily, Arava10mg  by mouth M-F, off on Sat and5 mg onSun, and Vit D supplement daily. - Continue home Arava - Hold home Tramadol - Hold home Prednisone while receiving Decadron  Hashimoto's thyroiditis TSH 53.317 on admission. On Synthroid 150 mcg at homebut reports not having taken for a week. Likely cause of elevation. -Continue  home Synthroid - f/u TSH outpatient  History of PE Patient indicates she has history of PE that occurred between 1-5 years ago for which she takes Xarelto 20mg  nightly.  - Continue home Xarelto  Hyperlipidemia Chronic, stable. Lipid panel on 03/13/2020 showed LDL of 63. On simvastatin 20 mg at home.  -Continue home simvastatin  GERD Chronic, stable. On Protonix40mg  BID at home. -Holding home Protonix for now  MildMitral Regurg Chronic, stable. Echo in 02/2018 showedEF 60-65% with mild mitral regurg but severely calcified annulus and moderately thickened, moderately calcified leaflets. - Holding home lasix  FEN/GI: Regular diet PPx: Xarelto   Status is: Inpatient Remains inpatient appropriate because:Unsafe d/c plan and awaiting SNF   Dispo: The patient is from: Home              Anticipated d/c is to: SNF              Anticipated d/c date is: 2 days              Patient currently is medically stable to d/c.   Difficult to place patient No   Subjective:  No acute events overnight. Patient indicates she is not sleeping well and unable to get comfortable.  She is otherwise feeling well.  No chest pain or difficulty breathing.  Objective: Temp:  [98 F (36.7 C)-98.4 F (36.9 C)] 98 F (36.7 C) (01/24 0455) Pulse Rate:  [63-73] 63 (01/24 0455) Resp:  [16-18] 18 (01/24 0455) BP: (116-130)/(65-87) 116/65 (01/24 0455) SpO2:  [94 %-96 %] 95 % (01/24 0455) Physical Exam:  Physical Exam Constitutional:      Appearance: Normal appearance.  HENT:     Head: Normocephalic and atraumatic.  Comments: Eyes appear somewhat sunken    Mouth/Throat:     Mouth: Mucous membranes are moist.  Cardiovascular:     Rate and Rhythm: Normal rate and regular rhythm.  Pulmonary:     Effort: Pulmonary effort is normal.     Breath sounds: Normal breath sounds.  Skin:    Coloration: Skin is pale.  Neurological:     General: No focal deficit present.     Mental Status: She is  alert and oriented to person, place, and time.     Laboratory: Recent Labs  Lab 01/06/21 0237 01/07/21 0332 01/08/21 0421  WBC 4.6 3.6* 5.9  HGB 10.8* 10.6* 10.8*  HCT 36.2 34.2* 34.7*  PLT 117* 132* 168   Recent Labs  Lab 01/05/21 1427 01/06/21 0237 01/07/21 0332 01/08/21 0421  NA 133* 135 141 137  K 3.7 3.8 4.0 4.2  CL 100 108 112* 109  CO2 21* 18* 22 21*  BUN 21 20 28* 35*  CREATININE 1.65* 1.38* 1.36* 1.48*  CALCIUM 8.3* 7.5* 7.8* 8.3*  PROT 6.7  --  5.2* 5.1*  BILITOT 1.0  --  0.7 0.5  ALKPHOS 86  --  71 66  ALT 22  --  15 20  AST 41  --  21 27  GLUCOSE 102* 128* 147* 138*    Imaging/Diagnostic Tests: No new imaging/diagnostic tests in past 24h.  Jovita Kussmaul, MD 01/08/2021, 9:41 AM PGY-1, [facility] Family Medicine FPTS Intern pager: [phone], text pages welcome

## 2021-01-08 NOTE — TOC Progression Note (Addendum)
Transition of Care [facility]) - Progression Note    Patient Details  Name: [name] MRN: [id number] Date of Birth: 12/02/1956  [facility]) CM/SW Contact  Beckie Busing, RN Phone Number: [phone]  01/08/2021, 2:25 PM  Clinical Narrative:    Currently no bed offers. CM called [facility] for bed request , Sheliah Hatch states that patients insurance is out of network. Attempted to call [facility] no answer and the voicemail is full. CM will attempt to call at a later time. TOC will continue to follow.   1437 CM received call from [facility]. They will review info and determine if they can make a bed offer.    Expected Discharge Plan: Home w Home Health Services Barriers to Discharge: Continued Medical Work up  Expected Discharge Plan and Services Expected Discharge Plan: Home w Home Health Services       Living arrangements for the past 2 months: Apartment                                       Social Determinants of Health (SDOH) Interventions    Readmission Risk Interventions No flowsheet data found.

## 2021-01-09 DIAGNOSIS — R0902 Hypoxemia: Secondary | ICD-10-CM | POA: Diagnosis not present

## 2021-01-09 DIAGNOSIS — N179 Acute kidney failure, unspecified: Secondary | ICD-10-CM | POA: Diagnosis not present

## 2021-01-09 DIAGNOSIS — U071 COVID-19: Secondary | ICD-10-CM | POA: Diagnosis not present

## 2021-01-09 LAB — COMPREHENSIVE METABOLIC PANEL
ALT: 43 U/L (ref 0–44)
AST: 65 U/L — ABNORMAL HIGH (ref 15–41)
Albumin: 2.1 g/dL — ABNORMAL LOW (ref 3.5–5.0)
Alkaline Phosphatase: 67 U/L (ref 38–126)
Anion gap: 8 (ref 5–15)
BUN: 38 mg/dL — ABNORMAL HIGH (ref 8–23)
CO2: 21 mmol/L — ABNORMAL LOW (ref 22–32)
Calcium: 8.3 mg/dL — ABNORMAL LOW (ref 8.9–10.3)
Chloride: 107 mmol/L (ref 98–111)
Creatinine, Ser: 1.47 mg/dL — ABNORMAL HIGH (ref 0.44–1.00)
GFR, Estimated: 40 mL/min — ABNORMAL LOW (ref >60)
Glucose, Bld: 162 mg/dL — ABNORMAL HIGH (ref 70–99)
Potassium: 4.4 mmol/L (ref 3.5–5.1)
Sodium: 136 mmol/L (ref 135–145)
Total Bilirubin: 0.6 mg/dL (ref 0.3–1.2)
Total Protein: 5 g/dL — ABNORMAL LOW (ref 6.5–8.1)

## 2021-01-09 LAB — CBC
HCT: 33.8 % — ABNORMAL LOW (ref 36.0–46.0)
Hemoglobin: 10.7 g/dL — ABNORMAL LOW (ref 12.0–15.0)
MCH: 30.5 pg (ref 26.0–34.0)
MCHC: 31.7 g/dL (ref 30.0–36.0)
MCV: 96.3 fL (ref 80.0–100.0)
Platelets: 168 10*3/uL (ref 150–400)
RBC: 3.51 MIL/uL — ABNORMAL LOW (ref 3.87–5.11)
RDW: 14 % (ref 11.5–15.5)
WBC: 4.1 10*3/uL (ref 4.0–10.5)
nRBC: 0 % (ref 0.0–0.2)

## 2021-01-09 LAB — CK: Total CK: 12 U/L — ABNORMAL LOW (ref 38–234)

## 2021-01-09 MED ORDER — TRAMADOL HCL 50 MG PO TABS
50.0000 mg | ORAL_TABLET | Freq: Three times a day (TID) | ORAL | Status: DC | PRN
Start: 2021-01-09 — End: 2021-01-10
  Administered 2021-01-10: 50 mg via ORAL
  Filled 2021-01-09: qty 1

## 2021-01-09 NOTE — Progress Notes (Signed)
Per daughter in law, the home is unsafe with flights of stairs for the patient to go to the bedroom and the home is unsafe due to hoarding.  Also per daughter in law the son she lives with will not help take care of her. The other son works full time.

## 2021-01-09 NOTE — Progress Notes (Signed)
Patients son Thayer Ohm called evening nurse, requesting to leave a note for the doctor to call in the morning to speak with family during/after morning rounds to go over goals of care currently and post hospitalization. I will leave note for AM nurse with family members number on it.

## 2021-01-09 NOTE — Progress Notes (Signed)
Family Medicine Teaching Service Daily Progress Note Intern Pager: [phone]  Patient name: [name] Medical record number: [id number] Date of birth: [birth year] Age: 65 y.o. Gender: female  Primary Care Provider: Virgilio Belling, PA-C Consultants: None Code Status: Full  Pt Overview and Major Events to Date:  1/21:  Admitted  Assessment and Plan: Kerrin Markman Pruittis a 65 y.o.femalewhopresentedwith myalgias and was found to be COVID positive. 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   COVID-19  Myalgias Stable on room air this morning.  Lungs CTAB. Still complaining of significant muscle pain causing discomfort and issues with sleep.  PT now reccomending home health PT/OT.  Patient ammenable to going home but asking to go tomorrow so sonm has time to prepare house for her. -Tylenol 650 mg q4h -Continue Remdesivir (day 5/5) -Continue Decadron (day 5/10) -Will obtain CK -Will restart home Tramadol today for myalgias -PT/OT now recommending HH w/ PT/OT -Appreciate TOC assistance with SNF placement -Plan to D/C tomorrow  CKD stage IIIb Creatinine 1.49 this morning, unchanged after receiving fluids.  Likely new baseline.  Will stop fluids at this time. -Daily BMP -D/C fluids -Holding home Lasix  Hypertension BP has been on lower side for past 24h. Systolic 90-125.On Lasix 20 mg at home. - Holdinghome Lasix -Continue to monitor BP - Encourage PO fluid intake  COPD Chronic, stable. Not in acute exacerbation. Initial respiratory symptoms thought to be due to COVID and are now resolved. Former smoker with >40 pack year hx. - Consideroutpatient low dose CT per USPSTF guidelines  Rheumatoid arthritis Chronic, stable. Home meds include:Tramadol 50 mg q8hr PRN,Prednisone 5mg daily,Arava10mg by mouth M-F, off on Sat and5 mg onSun,and Vit D  supplement daily. - Continue home Arava - Will restart home Tramadol today for myalgias - Hold home Prednisone while receiving Decadron  Hashimoto's thyroiditis TSH 53.317on admission. On Synthroid 150 mcg at homebut reports not having taken for a week. Likely cause of elevation. -Continue home Synthroid - f/u TSH outpatient  History of PE Patient indicates she has history of PE that occurred between 1-5 years ago for which she takes Xarelto20mg  nightly. - Continue home Xarelto  Hyperlipidemia Chronic, stable.Lipid panel on 03/13/2020 showed LDL of 63. On simvastatin 20 mg at home.  -Continue home simvastatin  GERD Chronic, stable.On Protonix40mg  BIDat home. -Holding home Protonixfor now  FEN/GI: Regular diet PPx: Xarelto   Status is: Inpatient  Remains inpatient appropriate because:Unsafe d/c plan   Dispo: The patient is from: Home              Anticipated d/c is to: SNF              Anticipated d/c date is: 3 days              Patient currently is medically stable to d/c.   Difficult to place patient No    Subjective:  Patient has continued to have issues with discomfort, that have caused issues with sleep.  Indicates drinking and eating have improved.  Objective: Temp:  [97.9 F (36.6 C)-98.5 F (36.9 C)] 98 F (36.7 C) (01/25 0437) Pulse Rate:  [68-70] 70 (01/25 0437) Resp:  [14-18] 18 (01/25 0437) BP: (128-131)/(80-86) 131/80 (01/25 0437) SpO2:  [95 %-96 %] 96 % (01/25 0437) Physical Exam:  Physical Exam Constitutional:      Appearance: Normal appearance.     Comments: Patient appears more energetic and better hydrated today  HENT:  Head: Normocephalic and atraumatic.     Mouth/Throat:     Mouth: Mucous membranes are moist.  Cardiovascular:     Rate and Rhythm: Normal rate and regular rhythm.     Pulses: Normal pulses.  Pulmonary:     Effort: Pulmonary effort is normal.     Breath sounds: Normal breath sounds.  Abdominal:      General: Abdomen is flat. There is no distension.     Tenderness: There is no abdominal tenderness.  Skin:    General: Skin is warm.     Capillary Refill: Capillary refill takes less than 2 seconds.  Neurological:     General: No focal deficit present.     Mental Status: She is alert.     Laboratory: Recent Labs  Lab 01/07/21 0332 01/08/21 0421 01/09/21 0500  WBC 3.6* 5.9 4.1  HGB 10.6* 10.8* 10.7*  HCT 34.2* 34.7* 33.8*  PLT 132* 168 168   Recent Labs  Lab 01/07/21 0332 01/08/21 0421 01/09/21 0500  NA 141 137 136  K 4.0 4.2 4.4  CL 112* 109 107  CO2 22 21* 21*  BUN 28* 35* 38*  CREATININE 1.36* 1.48* 1.47*  CALCIUM 7.8* 8.3* 8.3*  PROT 5.2* 5.1* 5.0*  BILITOT 0.7 0.5 0.6  ALKPHOS 71 66 67  ALT 15 20 43  AST 21 27 65*  GLUCOSE 147* 138* 162*     Imaging/Diagnostic Tests: No new imaging  Jovita Kussmaul, MD 01/09/2021, 8:01 AM PGY-1, [facility] Family Medicine FPTS Intern pager: [phone], text pages welcome

## 2021-01-09 NOTE — Progress Notes (Signed)
Physical Therapy Treatment Patient Details Name: [name] MRN: [id number] DOB: 1956-08-11 Today's Date: 01/09/2021    History of Present Illness Pt is a 65 y/o female admitted secondary to fatigue and myalgia. Pt found to be covid +. PMH includes HTN, COPD, CKD, RA, PE, and Hashimotos.    PT Comments    Pt sitting EOB on arrival reporting generalized fatigue limiting mobility and function. Pt able to walk 2 trials today with use of RW. Deferred HEP due to fatigue and encouraged OOB for all meals as well as getting to bathroom for toileting. Will continue to follow and pt reports assist of son at home.     Follow Up Recommendations  Home health PT;Supervision for mobility/OOB     Equipment Recommendations  Rolling walker with 5" wheels    Recommendations for Other Services       Precautions / Restrictions Precautions Precautions: Fall Restrictions Weight Bearing Restrictions: No    Mobility  Bed Mobility               General bed mobility comments: pt sitting at EOB on arrival  Transfers Overall transfer level: Needs assistance   Transfers: Sit to/from Stand Sit to Stand: Min guard         General transfer comment: pt initially declined RW use with pt reaching for environmental support to stand. With RW on 2nd trial cues for hand placement  Ambulation/Gait Ambulation/Gait assistance: Min guard Gait Distance (Feet): 35 Feet Assistive device: Rolling walker (2 wheeled) Gait Pattern/deviations: Step-through pattern;Decreased stride length;Trunk flexed   Gait velocity interpretation: 1.31 - 2.62 ft/sec, indicative of limited community ambulator General Gait Details: pt with initial 24' gait reaching for environmental support and agreeable to RW use half way through. 2nd trial walking 28' with Rw throughout with improved stability and endurance   Stairs             Wheelchair Mobility    Modified Rankin (Stroke Patients Only)        Balance Overall balance assessment: Needs assistance   Sitting balance-Leahy Scale: Good Sitting balance - Comments: no UE support EOB     Standing balance-Leahy Scale: Poor Standing balance comment: UE support for standing                            Cognition Arousal/Alertness: Awake/alert Behavior During Therapy: WFL for tasks assessed/performed Overall Cognitive Status: Within Functional Limits for tasks assessed                                        Exercises      General Comments        Pertinent Vitals/Pain Pain Assessment: No/denies pain    Home Living                      Prior Function            PT Goals (current goals can now be found in the care plan section) Progress towards PT goals: Progressing toward goals    Frequency    Min 3X/week      PT Plan Discharge plan needs to be updated    Co-evaluation              AM-PAC PT "6 Clicks" Mobility   Outcome Measure  Help needed turning from your back  to your side while in a flat bed without using bedrails?: None Help needed moving from lying on your back to sitting on the side of a flat bed without using bedrails?: None Help needed moving to and from a bed to a chair (including a wheelchair)?: A Little Help needed standing up from a chair using your arms (e.g., wheelchair or bedside chair)?: A Little Help needed to walk in hospital room?: A Little Help needed climbing 3-5 steps with a railing? : A Lot 6 Click Score: 19    End of Session Equipment Utilized During Treatment: Gait belt Activity Tolerance: Patient tolerated treatment well Patient left: in chair;with call bell/phone within reach;with chair alarm set Nurse Communication: Mobility status PT Visit Diagnosis: Other abnormalities of gait and mobility (R26.89);Difficulty in walking, not elsewhere classified (R26.2)     Time: 1137-1201 PT Time Calculation (min) (ACUTE ONLY): 24  min  Charges:  $Gait Training: 8-22 mins $Therapeutic Activity: 8-22 mins                     [name] P, PT Acute Rehabilitation Services Pager: [phone] Office: [phone]    [name] 01/09/2021, 12:58 PM

## 2021-01-09 NOTE — TOC Progression Note (Signed)
Transition of Care [facility]) - Progression Note    Patient Details  Name: [name] MRN: [id number] Date of Birth: 1956-04-05  Transition of Care [facility]) CM/SW Contact  Beckie Busing, RN Phone Number: [phone]  01/09/2021, 8:29 AM  Clinical Narrative:    CM received message from Ardell Isaacs at [facility] stating that the facility can extend a bed offer. Patient has BCBS so the facility must initiate insurance auth. Ardell Isaacs states that she will initiate insurance auth right now. CM spoke with patient via phone and patient is agreeable to go to [facility] for rehab. TOC will continue to follow for placement.   Expected Discharge Plan: Home w Home Health Services Barriers to Discharge: Continued Medical Work up  Expected Discharge Plan and Services Expected Discharge Plan: Home w Home Health Services       Living arrangements for the past 2 months: Apartment                                       Social Determinants of Health (SDOH) Interventions    Readmission Risk Interventions No flowsheet data found.

## 2021-01-10 LAB — CULTURE, BLOOD (ROUTINE X 2)
Culture: NO GROWTH
Special Requests: ADEQUATE

## 2021-01-10 MED ORDER — POLYETHYLENE GLYCOL 3350 17 G PO PACK: 17.0000 g | PACK | Freq: Every day | ORAL | 0 refills | Status: DC | PRN

## 2021-01-10 NOTE — TOC Progression Note (Addendum)
Transition of Care [facility]) - Progression Note    Patient Details  Name: [name] MRN: [id number] Date of Birth: 11/28/1956  Transition of Care [facility]) CM/SW Contact  Beckie Busing, RN Phone Number: [phone]  01/10/2021, 8:48 AM  Clinical Narrative:    CM has called [facility] to follow up on bed request. No answer, voicemail left. WIll await return call.  0930 Call received from [facility] stating that they are able to take patient today. CM spoke with patient and patient is agreeable to go to [place]. Patient requested that CM update son Thayer Ohm. CM attempted to call Thayer Ohm but no answer. Message has been left for Thayer Ohm will await return call. MD has been made aware of bed offer. Patient to be discharged upon entry of discharge orders and d/c summary. TOC will continue to follow for set up of transportation.   Expected Discharge Plan: Home w Home Health Services Barriers to Discharge: Continued Medical Work up  Expected Discharge Plan and Services Expected Discharge Plan: Home w Home Health Services       Living arrangements for the past 2 months: Apartment                                       Social Determinants of Health (SDOH) Interventions    Readmission Risk Interventions No flowsheet data found.

## 2021-01-10 NOTE — Plan of Care (Signed)
  Problem: Education: Goal: Knowledge of risk factors and measures for prevention of condition will improve 01/10/2021 1341 by Marily Memos, RN Outcome: Adequate for Discharge 01/10/2021 1324 by Marily Memos, RN Outcome: Adequate for Discharge   Problem: Coping: Goal: Psychosocial and spiritual needs will be supported 01/10/2021 1341 by Marily Memos, RN Outcome: Adequate for Discharge 01/10/2021 1324 by Marily Memos, RN Outcome: Adequate for Discharge   Problem: Respiratory: Goal: Will maintain a patent airway 01/10/2021 1341 by Marily Memos, RN Outcome: Adequate for Discharge 01/10/2021 1324 by Marily Memos, RN Outcome: Adequate for Discharge Goal: Complications related to the disease process, condition or treatment will be avoided or minimized 01/10/2021 1341 by Marily Memos, RN Outcome: Adequate for Discharge 01/10/2021 1324 by Marily Memos, RN Outcome: Adequate for Discharge

## 2021-01-10 NOTE — TOC Transition Note (Signed)
Transition of Care [facility]) - CM/SW Discharge Note   Patient Details  Name: SOFYA MOUSTAFA MRN: [id number] Date of Birth: Oct 28, 1956  Transition of Care [facility]) CM/SW Contact:  Beckie Busing, RN Phone Number: [phone]  01/10/2021, 12:58 PM   Clinical Narrative:    Discharge summary has been faxed to [facility]. Family has been updated per MD. Patient has been updated. Discharge packet is at nurses station. No other needs noted at this time. Transportation set up with [facility]. Message has been sent to bedside nurse.  Please call report [facility] Room # [phone]      Final next level of care: Skilled Nursing Facility Barriers to Discharge: No Barriers Identified   Patient Goals and CMS Choice   CMS Medicare.gov Compare Post Acute Care list provided to:: Patient Choice offered to / list presented to : Patient  Discharge Placement              Patient chooses bed at: [facility] Patient to be transferred to facility by: [facility] Name of family member notified: [name] per MD report Patient and family notified of of transfer: 01/10/21  Discharge Plan and Services                DME Arranged: N/A DME Agency: NA       HH Arranged: NA HH Agency: NA        Social Determinants of Health (SDOH) Interventions     Readmission Risk Interventions No flowsheet data found.

## 2021-01-10 NOTE — Plan of Care (Signed)

## 2021-01-10 NOTE — Discharge Summary (Signed)
Family Medicine Teaching [facility] Discharge Summary  Patient name: [name] Medical record number: [id number] Date of birth: 1956/04/09 Age: 65 y.o. 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  Disposition: Able to be discharged to SNF for comntinued PT/OT  Discharge Condition: Stable  Discharge Exam:   Constitutional:      Appearance: Normal appearance.     Comments: Patient energy level continues to improve, appears well hyudrated as well HENT:     Head: Normocephalic and atraumatic.     Mouth/Throat:     Mouth: Mucous membranes are moist.  Cardiovascular:     Rate and Rhythm: Normal rate and regular rhythm.     Pulses: Normal pulses.  Pulmonary:     Effort: Pulmonary effort is normal.     Breath sounds: Normal breath sounds.  Abdominal:     General: Abdomen is flat. There is no distension.     Tenderness: There is no abdominal tenderness.  Skin:    General: Skin is warm.     Capillary Refill: Capillary refill takes less than 2 seconds.  Neurological:     General: No focal deficit present.     Mental Status: She is alert.   Brief Hospital Course:  [name] is a 65 y.o. female who presented with myalgias and was found to be COVID positive. 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  COVID-19 Patient came in due to increased fatigue.  Tested positive for COVID-19 on 01/04/21. Had minimal O2 requirement given some additional desats to 80's.  Possibly compounded by COPD.  Chest X-Ray  shows no signs of Pneumonia.   Normal chest X-Ray and normal LA x 2.  Got 5 days of Remdesevir and Decadron. Lungs remained clear and patient wasa stable on RA at time of discharge.  Myalgia and Discomfort Likely due to COVID.  Could be compounded by Hashimoto's for which patient had stoped taking medications  Patient given Tylenol and home Tramadol for. Patient seen by PT and had defecits, which improved somewhat througfhout stay Improved but not completely resolved by time of D/C.  AKI on CKD3b Patient has fluctuating Creatinine with mutliple hospitalizations over past year.  Cr is 1.65 on admission with GFR 35.   Cr improved to 1.5 Could also be worsening progression on of CKD. Patient presented dehydrated, reports she has not been able to keep much down over the last week or two. Received IVF and was stopped when patient had good oral fluid intake and Kidney function stable at 1.4.  Hashimoto's thyroiditis TSH 53.317 on admission. On Synthroid 150 mcg at home but reports not having taken for a week.  Likely cause of elevation.  Was restarted on admission.       Issues for Follow Up:  1. Strongly recommend patient receive Booster after 01/25/21 when she is completely COVID cleared given high risk of serious infection with COPD and RA with immunosupression 2. Lasix held at time of D/C given kidney function.  Check BMP and consider restarting Lasix or not given kidney function 3.  Stopping Potassium supplement as well as not on Lasix anymore 4.  Repeat TSH in 2-4 weeks  Significant Procedures: None  Significant Labs and Imaging:  Recent Labs  Lab 01/07/21 0332 01/08/21 0421 01/09/21 0500  WBC 3.6* 5.9 4.1  HGB 10.6* 10.8* 10.7*  HCT 34.2* 34.7* 33.8*  PLT 132* 168 168   Recent Labs  Lab 01/05/21 1427 01/06/21 0237 01/07/21 0332 01/08/21 0421 01/09/21 0500  NA 133* 135 141 137 136  K 3.7 3.8 4.0 4.2 4.4  CL 100 108 112* 109 107  CO2 21* 18* 22 21* 21*  GLUCOSE 102* 128*  147* 138* 162*  BUN 21 20 28* 35* 38*  CREATININE 1.65* 1.38* 1.36* 1.48* 1.47*  CALCIUM 8.3* 7.5* 7.8* 8.3* 8.3*  ALKPHOS 86  --  71 66 67  AST 41  --  21 27 65*  ALT 22  --  15 20 43  ALBUMIN 2.8*  --  2.1* 2.1* 2.1*    EXAM: PORTABLE CHEST 1 VIEW  COMPARISON:  01/11/2020.  FINDINGS: Mild enlargement the cardiac silhouette. No consolidation. Scattered calcified granulomas, better characterized on recent CT chest. No visible pleural effusions or pneumothorax. No acute osseous abnormality. Thoracic inlet clips.  IMPRESSION: 1. No evidence of acute cardiopulmonary disease. 2. Chronic cardiomegaly and central pulmonary vascular congestion.  Results/Tests Pending at Time of Discharge: None  Discharge Medications:  Allergies as of 01/10/2021      Reactions   Oxycodone-acetaminophen Nausea Only, Other (See Comments)   Violent Vomiting Violent Vomiting   Penicillins Anaphylaxis, Swelling, Rash   Has patient had a PCN reaction causing immediate rash, facial/tongue/throat swelling, SOB or lightheadedness with hypotension: Yes Has patient had a PCN reaction causing severe rash involving mucus membranes or skin necrosis: No Has patient had a PCN reaction that required hospitalization No Has patient had a PCN reaction occurring within the last 10 years: No If all of the above answers are "NO", then may proceed with Cephalosporin use.   Latex Hives, Itching   Blisters (also)   Oxycodone-acetaminophen Nausea And Vomiting   Tyloxapol Nausea And Vomiting   Adhesive [tape] Rash   Patient prefers paper tape   Strawberry Extract Hives, Itching, Rash      Medication List    STOP taking these medications   furosemide 20 MG tablet Commonly known as: LASIX   HYDROcodone-acetaminophen 5-325 MG tablet Commonly known as: NORCO/VICODIN   ibuprofen 200 MG tablet Commonly known as: ADVIL   potassium chloride 10 MEQ tablet Commonly known as: KLOR-CON     TAKE these medications    cholecalciferol 1000 units tablet Commonly known as: VITAMIN D take 1 tablet by mouth once daily   leflunomide 10 MG tablet Commonly known as: ARAVA Take 10 mg by mouth See admin instructions. Monday-friday   levothyroxine 150 MCG tablet Commonly known as: SYNTHROID Take 1 tablet (150 mcg total) by mouth daily.   ondansetron 4 MG disintegrating tablet Commonly known as: Zofran ODT Take 1 tablet (4 mg total) by mouth every 8 (eight) hours as needed.   One-A-Day Womens 50+ Advantage Tabs Take 1 tablet by mouth daily.   pantoprazole 40 MG tablet Commonly known as: PROTONIX TAKE TWO TABLETS BY MOUTH AT BEDTIME What changed:   how much to take  how to take this  when to take this  additional instructions   polyethylene glycol 17 g packet Commonly known as: MIRALAX / GLYCOLAX Take 17 g by mouth daily as needed for mild constipation.   predniSONE 5 MG tablet Commonly known as: DELTASONE TAKE ONE TABLET BY MOUTH ONE TIME DAILY What  changed: when to take this   senna-docusate 8.6-50 MG tablet Commonly known as: Senokot-S Take 1 tablet by mouth at bedtime as needed for mild constipation or moderate constipation.   simvastatin 20 MG tablet Commonly known as: ZOCOR Take 1 tablet (20 mg total) by mouth daily at 6 PM.   traMADol 50 MG tablet Commonly known as: ULTRAM TAKE 1 TABLET (50MG ) BY MOUTH 3 TIMES DAILY AS NEEDED FOR MODERATE OR SEVERE PAIN What changed:   how much to take  how to take this  when to take this  reasons to take this  additional instructions   vitamin C 1000 MG tablet Take 1,000 mg by mouth daily.   Xarelto 20 MG Tabs tablet Generic drug: rivaroxaban Take 20 mg by mouth daily.       Discharge Instructions: Please refer to Patient Instructions section of EMR for full details.  Patient was counseled important signs and symptoms that should prompt return to medical care, changes in medications, dietary instructions, activity  restrictions, and follow up appointments.   Follow-Up Appointments:   Jovita Kussmaul, MD 01/10/2021, 12:09 PM PGY-1, [facility]

## 2021-01-11 ENCOUNTER — Encounter: Payer: Self-pay | Admitting: Internal Medicine

## 2021-01-11 ENCOUNTER — Non-Acute Institutional Stay (SKILLED_NURSING_FACILITY): Payer: BLUE CROSS/BLUE SHIELD | Admitting: Internal Medicine

## 2021-01-11 DIAGNOSIS — M069 Rheumatoid arthritis, unspecified: Secondary | ICD-10-CM

## 2021-01-11 DIAGNOSIS — E038 Other specified hypothyroidism: Secondary | ICD-10-CM | POA: Diagnosis not present

## 2021-01-11 DIAGNOSIS — E063 Autoimmune thyroiditis: Secondary | ICD-10-CM

## 2021-01-11 DIAGNOSIS — U071 COVID-19: Secondary | ICD-10-CM

## 2021-01-11 DIAGNOSIS — N179 Acute kidney failure, unspecified: Secondary | ICD-10-CM | POA: Diagnosis not present

## 2021-01-11 LAB — CULTURE, BLOOD (ROUTINE X 2): Culture: NO GROWTH

## 2021-01-11 NOTE — Assessment & Plan Note (Addendum)
121-01/10/2021 admitted with myalgias and progressive fatigue in the context of positive COVID-19. TSH 53.32 in the context of noncompliance with L-thyroxine 150 mg for hypothyroidism due to Hashimoto's thyroiditis and prior partial thyroidectomy. L-thyroxine initiated as an inpatient; repeat TSH recommended in 4-6 weeks

## 2021-01-11 NOTE — Assessment & Plan Note (Signed)
Avoid nephrotoxic medications 

## 2021-01-11 NOTE — Patient Instructions (Signed)
See assessment and plan under each diagnosis in the problem list and acutely for this visit 

## 2021-01-11 NOTE — Progress Notes (Signed)
NURSING HOME LOCATION:  [facility]  ROOM NUMBER:  [id number]  CODE STATUS: Full Code    PCP:  Virgilio Belling, PA-C   This is a comprehensive admission note to [facility] performed on this date less than 30 days from date of admission. Included are preadmission medical/surgical history; reconciled medication list; family history; social history and comprehensive review of systems.  Corrections and additions to the records were documented. Comprehensive physical exam was also performed. Additionally a clinical summary was entered for each active diagnosis pertinent to this admission in the Problem List to enhance continuity of care.  HPI: Patient was hospitalized 1/21-1/26/2022, presenting with myalgias and progressive fatigue after found to be COVID-19 positive on 1/20.  Oxygen desaturation into the 80% plus range was noted necessitating supplemental oxygen.  This was superimposed on COPD.  Chest x-ray did not show active Covid pneumonia.  Patient received 5 days of remdesivir and Decadron.  Covid vaccine booster was recommended after 2/10 as she is immunosuppressed in the context of RA on Arava and maintenance oral steroids.  Admission creatinine was 1.65 with GFR 35.  Patient stated that she had not been able to ingest adequate nutrition and fluids in the week-2 weeks PTA. Lasix was held.With rehydration creatinine improved to 1.47/GFR 40.  Myalgias in fatigue persisted. It was postulated that this may have been impacted by her non compliance with L thyroxine for  hypothyroidism in context of history of partial thyroidectomy & Hashimoto's thyroiditis . At admission TSH was 53.32.  She states since she had not taken her L-thyroxine 150 mcg for at least a week due to anorexia.  L-thyroxine was restarted & repeat TSH recommended in 4-6 weeks. PT/OT recommended SNF placement for rehab.  Past medical and surgical history: Includes GERD,  hemophilia B carrier state, essential  hypertension, COPD, CKD stage IIIa, history of PTE, dyslipidemia, and rheumatoid arthritis. Surgeries and procedures include cholecystectomy, partial thyroidectomy, and ventral hernia repair.  Social history: No history of alcohol use,former pack  per day smoker with > 40-pack-year history of consumption.  Family history: Extensive history reviewed   Review of systems:  She continues to be profoundly tired and have diffuse myalgias.  She states that her appetite is affected by altered smell and some foods are somewhat nauseating. As noted it was because of this that she did not take her thyroid medication for probably 2 weeks PTA.  She denies any other Covid symptoms.  She has constipation chronically.  Constitutional: No fever, significant weight change  Eyes: No redness, discharge, pain, vision change ENT/mouth: No nasal congestion, purulent discharge, earache, change in hearing, sore throat  Cardiovascular: No chest pain, palpitations, paroxysmal nocturnal dyspnea, claudication, edema  Respiratory: No cough, sputum production, hemoptysis, DOE, significant snoring, apnea Gastrointestinal: No heartburn, dysphagia, abdominal pain, nausea /vomiting, rectal bleeding, melena Genitourinary: No dysuria, hematuria, pyuria, incontinence, nocturia Dermatologic: No new rash, pruritus, change in appearance of skin Neurologic: No dizziness, headache, syncope, seizures, numbness, tingling Psychiatric: No significant anxiety, depression, insomnia Endocrine: No change in hair/skin/nails, excessive thirst, excessive hunger, excessive urination  Hematologic/lymphatic: No significant bruising, lymphadenopathy, abnormal bleeding Allergy/immunology: No itchy/watery eyes, significant sneezing, urticaria, angioedema  Physical exam:  Pertinent or positive findings: She is hard of hearing; the television was blaring is I entered the room.  Hair is disheveled.  There is minimal anisocoria with the right pupil  minimally greater than the left.  Eyebrows are thin laterally.  There is wrinkling in the perioral area suggesting poorly  fitting dentures.  She does have complete dentures.  Breath sounds are decreased.  There are minor rales at the bases.  Heart sounds are distant.  Clinically the rhythm appeared regular.  Abdomen is protuberant.  She has thickened hyperpigmented stasis dermatitis changes over the shins.  There is 1+ edema at the sock line.  Pedal pulses are decreased.  Strength to opposition is fair.  She has diffuse bruising over the upper extremities.  There is a contracture of the right index finger with polypoid enlargement of the PIP joint.  She has contractures of the left third through fifth fingers.  General appearance: no acute distress, increased work of breathing is present.   Lymphatic: No lymphadenopathy about the head, neck, axilla. Eyes: No conjunctival inflammation or lid edema is present. There is no scleral icterus. Ears:  External ear exam shows no significant lesions or deformities.   Nose:  External nasal examination shows no deformity or inflammation. Nasal mucosa are pink and moist without lesions, exudates Neck:  No thyromegaly, masses, tenderness noted.    Heart:  No gallop, murmur, click, rub.  Lungs:  without wheezes, rhonchi,  rubs. Abdomen: Bowel sounds are normal.  Abdomen is soft and nontender with no organomegaly, hernias, masses. GU: Deferred  Extremities:  No cyanosis, clubbing. Neurologic exam:  Balance, Rhomberg, finger to nose testing could not be completed due to clinical state Skin: Warm & dry w/o tenting. No significant lesions or rash.  See clinical summary under each active problem in the Problem List with associated updated therapeutic plan

## 2021-01-11 NOTE — Assessment & Plan Note (Signed)
Immunocompromised host due to Arava and chronic oral steroids. Booster Covid vaccine recommended after 2/10.

## 2021-01-12 ENCOUNTER — Encounter: Payer: Self-pay | Admitting: Internal Medicine

## 2021-01-12 NOTE — Assessment & Plan Note (Signed)
COVID related URT symptoms improving but still impacting nutrition Nutrition consult @ SNF

## 2021-01-13 NOTE — Progress Notes (Signed)
Patient ID: [name], female   DOB: 20-Jun-1956, 65 y.o.   MRN: [id number] H&P completed 1/27; this note entered in error.

## 2021-01-14 NOTE — Progress Notes (Signed)
This encounter was created in error - please disregard.

## 2021-01-18 ENCOUNTER — Encounter: Payer: Self-pay | Admitting: Adult Health

## 2021-01-18 ENCOUNTER — Non-Acute Institutional Stay (INDEPENDENT_AMBULATORY_CARE_PROVIDER_SITE_OTHER): Payer: Self-pay | Admitting: Adult Health

## 2021-01-18 DIAGNOSIS — K219 Gastro-esophageal reflux disease without esophagitis: Secondary | ICD-10-CM

## 2021-01-18 DIAGNOSIS — E063 Autoimmune thyroiditis: Secondary | ICD-10-CM

## 2021-01-18 DIAGNOSIS — M069 Rheumatoid arthritis, unspecified: Secondary | ICD-10-CM

## 2021-01-18 DIAGNOSIS — E038 Other specified hypothyroidism: Secondary | ICD-10-CM

## 2021-01-18 DIAGNOSIS — J439 Emphysema, unspecified: Secondary | ICD-10-CM

## 2021-01-18 LAB — BASIC METABOLIC PANEL
BUN: 31 — AB (ref 4–21)
CO2: 26 — AB (ref 13–22)
Chloride: 103 (ref 99–108)
Creatinine: 1.3 — AB (ref 0.5–1.1)
Glucose: 86
Potassium: 4.5 (ref 3.4–5.3)
Sodium: 139 (ref 137–147)

## 2021-01-18 LAB — CBC AND DIFFERENTIAL
HCT: 33 — AB (ref 36–46)
Hemoglobin: 11 — AB (ref 12.0–16.0)
Neutrophils Absolute: 4.8
Platelets: 133 — AB (ref 150–399)
WBC: 6.4

## 2021-01-18 LAB — COMPREHENSIVE METABOLIC PANEL
Calcium: 8.9 (ref 8.7–10.7)
GFR calc Af Amer: 52.58
GFR calc non Af Amer: 45.37

## 2021-01-18 LAB — CBC: RBC: 3.6 — AB (ref 3.87–5.11)

## 2021-01-18 NOTE — Progress Notes (Addendum)
Location:  [facility] Room Number: 313-A Place of Service:  SNF (31) Provider:  Kenard Gower, DNP, FNP-BC  Patient Care Team: Chyrel Masson as PCP - General (Physician Assistant)  Extended Emergency Contact Information Primary Emergency Contact: [name] Mobile Phone: [phone] Relation: Son Secondary Emergency Contact: Melida Gimenez Mobile Phone: [phone] Relation: Daughter  Code Status:  FULL CODE  Goals of care: Advanced Directive information Advanced Directives 01/11/2021  Does Patient Have a Medical Advance Directive? No  Would patient like information on creating a medical advance directive? No - Patient declined     Chief Complaint  Patient presents with  . Acute Visit    Patient is seen for a short-term rehabilitation visit.     HPI:  Pt is a 65 y.o. female seen today for medical management of chronic diseases. 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 No SOB/wheezing. No complaints of joint pains. She takes leflunomide 10 mg daily Monday through Friday and prednisone 5 mg daily for rheumatoid arthritis. She takes levothyroxine 150 mcg daily for hypothyroidism.  She was admitted to [facility] on 01/10/21 post hospitalization 01/05/21 to 01/10/21 for COVID-19 infection.  Chest x-ray negative for pneumonia.  She got 5 days of remdesivir and Decadron.  Lungs remained clear and was on room air at time of discharge.   Past Medical History:  Diagnosis Date  . Arthritis   . Dyspnea    W/ EXERTION, +  HUMIDITY   . Emphysema lung (HCC)   . GERD (gastroesophageal reflux disease)   . Hashimoto's thyroiditis   . Heart murmur   . 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 HTN (hypertension)   . Hypercholesteremia   . Hypothyroid   . Rheumatoid arthritis(714.0)   . Varicose vein of leg    Past Surgical History:   Procedure Laterality Date  . APPENDECTOMY    . CHOLECYSTECTOMY    . ELBOW SURGERY     LEFT  . ENDOSCOPIC VEIN LASER TREATMENT     RIGHT LEG  . EXPLORATORY LAPAROTOMY    . HAND SURGERY     RIGHT   . HEEL SPUR SURGERY     BILAT  . INSERTION OF MESH N/A 08/04/2017   Procedure: INSERTION OF MESH;  Surgeon: Darnell Level, MD;  Location: [facility] OR;  Service: General;  Laterality: N/A;  . partial thyroidectomy  2015  . TONSILLECTOMY    . TUBAL LIGATION    . VENTRAL HERNIA REPAIR N/A 08/04/2017   Procedure: LAPAROSCOPIC VENTRAL INCISIONAL HERNIA REPAIR WITH MESH;  Surgeon: Darnell Level, MD;  Location: [facility] OR;  Service: General;  Laterality: N/A;    Allergies  Allergen Reactions  . Oxycodone-Acetaminophen Nausea Only and Other (See Comments)    Violent Vomiting Violent Vomiting  . Penicillins Anaphylaxis, Swelling and Rash    Has patient had a PCN reaction causing immediate rash, facial/tongue/throat swelling, SOB or lightheadedness with hypotension: Yes Has patient had a PCN reaction causing severe rash involving mucus membranes or skin necrosis: No Has patient had a PCN reaction that required hospitalization No Has patient had a PCN reaction occurring within the last 10 years: No If all of the above answers are "NO", then may proceed with Cephalosporin use.   . Latex Hives and Itching    Blisters (also)  . Oxycodone-Acetaminophen Nausea And Vomiting  . Tyloxapol Nausea And Vomiting  . Adhesive [Tape] Rash  Patient prefers paper tape  . Strawberry Extract Hives, Itching and Rash    Outpatient Encounter Medications as of 01/18/2021  Medication Sig  . Ascorbic Acid (VITAMIN C) 1000 MG tablet Take 1,000 mg by mouth daily.  . cholecalciferol (VITAMIN D) 1000 units tablet take 1 tablet by mouth once daily  . leflunomide (ARAVA) 10 MG tablet Take 10 mg by mouth See admin instructions. Monday-friday  . levothyroxine (SYNTHROID) 150 MCG tablet Take 150 mcg by mouth daily before breakfast.   . Multiple Vitamins-Minerals (ONE-A-DAY WOMENS 50+ ADVANTAGE) TABS Take 1 tablet by mouth daily.  . ondansetron (ZOFRAN ODT) 4 MG disintegrating tablet Take 1 tablet (4 mg total) by mouth every 8 (eight) hours as needed.  . pantoprazole (PROTONIX) 40 MG tablet TAKE TWO TABLETS BY MOUTH AT BEDTIME  . polyethylene glycol (MIRALAX / GLYCOLAX) 17 g packet Take 17 g by mouth daily as needed for mild constipation.  . predniSONE (DELTASONE) 5 MG tablet TAKE ONE TABLET BY MOUTH ONE TIME DAILY  . senna-docusate (SENOKOT-S) 8.6-50 MG tablet Take 1 tablet by mouth at bedtime as needed for mild constipation or moderate constipation.  . simvastatin (ZOCOR) 20 MG tablet Take 1 tablet (20 mg total) by mouth daily at 6 PM.  . traMADol (ULTRAM) 50 MG tablet TAKE 1 TABLET (50MG ) BY MOUTH 3 TIMES DAILY AS NEEDED FOR MODERATE OR SEVERE PAIN  . XARELTO 20 MG TABS tablet Take 20 mg by mouth daily.  . [DISCONTINUED] levothyroxine (SYNTHROID, LEVOTHROID) 150 MCG tablet Take 1 tablet (150 mcg total) by mouth daily.   No facility-administered encounter medications on file as of 01/18/2021.    Review of Systems  GENERAL: No change in appetite, no fatigue, no weight changes, no fever, chills or weakness MOUTH and THROAT: Denies oral discomfort, gingival pain or bleeding RESPIRATORY: no cough, SOB, DOE, wheezing, hemoptysis CARDIAC: No chest pain, edema or palpitations GI: No abdominal pain, diarrhea, constipation, heart burn, nausea or vomiting GU: Denies dysuria, frequency, hematuria, incontinence, or discharge NEUROLOGICAL: Denies dizziness, syncope, numbness, or headache PSYCHIATRIC: Denies feelings of depression or anxiety. No report of hallucinations, insomnia, paranoia, or agitation   Immunization History  Administered Date(s) Administered  . Influenza Whole 09/06/2008  . Influenza,inj,Quad PF,6+ Mos 09/02/2018  . Influenza,inj,quad, With Preservative 10/13/2019  . PFIZER(Purple Top)SARS-COV-2 Vaccination  04/14/2020, 05/12/2020  . Pneumococcal Conjugate-13 05/05/2017   Pertinent  Health Maintenance Due  Topic Date Due  . PAP SMEAR-Modifier  Never done  . COLONOSCOPY (Pts 45-44yrs Insurance coverage will need to be confirmed)  Never done  . MAMMOGRAM  Never done  . INFLUENZA VACCINE  07/16/2020   Fall Risk  10/28/2018 09/02/2018 08/04/2018 06/03/2018 04/21/2018  Falls in the past year? 1 No No Yes Yes  Number falls in past yr: 0 - - 2 or more 2 or more  Comment - - - - -  Injury with Fall? 0 - - Yes Yes  Comment - - - - bruising  Risk Factor Category  - - - High Fall Risk High Fall Risk  Comment - - - - -  Risk for fall due to : - - - Impaired balance/gait;History of fall(s) -  Risk for fall due to: Comment - - - - -  Follow up - - - - -     Vitals:   01/18/21 1446  BP: 113/78  Pulse: 98  Resp: 20  Temp: (!) 97.2 F (36.2 C)  TempSrc: Oral  Weight: 212 lb 3.2 oz (96.3  kg)  Height: 5\' 2"  (1.575 m)   Body mass index is 38.81 kg/m.  Physical Exam  GENERAL APPEARANCE: Well nourished. In no acute distress. Morbidly obese SKIN:  Skin is warm and dry.  MOUTH and THROAT: Lips are without lesions. Oral mucosa is moist and without lesions. Tongue is normal in shape, size, and color and without lesions RESPIRATORY: Breathing is even & unlabored, BS CTAB CARDIAC: RRR, no murmur,no extra heart sounds, no edema GI: Abdomen soft, normal BS, no masses, no tenderness EXTREMITIES:  Able to move X 4 extremities NEUROLOGICAL: There is no tremor. Speech is clear. Alert and oriented X 3. PSYCHIATRIC:  Affect and behavior are appropriate  Labs reviewed: Recent Labs    01/07/21 0332 01/08/21 0421 01/09/21 0500  NA 141 137 136  K 4.0 4.2 4.4  CL 112* 109 107  CO2 22 21* 21*  GLUCOSE 147* 138* 162*  BUN 28* 35* 38*  CREATININE 1.36* 1.48* 1.47*  CALCIUM 7.8* 8.3* 8.3*   Recent Labs    01/07/21 0332 01/08/21 0421 01/09/21 0500  AST 21 27 65*  ALT 15 20 43  ALKPHOS 71 66 67   BILITOT 0.7 0.5 0.6  PROT 5.2* 5.1* 5.0*  ALBUMIN 2.1* 2.1* 2.1*   Recent Labs    01/05/21 1427 01/06/21 0237 01/07/21 0332 01/08/21 0421 01/09/21 0500  WBC 5.9   < > 3.6* 5.9 4.1  NEUTROABS 4.4  --   --   --   --   HGB 12.3   < > 10.6* 10.8* 10.7*  HCT 39.9   < > 34.2* 34.7* 33.8*  MCV 97.1   < > 97.2 96.4 96.3  PLT 142*   < > 132* 168 168   < > = values in this interval not displayed.   Lab Results  Component Value Date   TSH 53.317 (H) 01/05/2021   Lab Results  Component Value Date   HGBA1C 5.2 11/14/2016   Lab Results  Component Value Date   CHOL 224 (H) 11/15/2016   HDL 51 11/15/2016   LDLCALC 142 (H) 11/15/2016   TRIG 157 (H) 11/15/2016   CHOLHDL 4.4 11/15/2016    Significant Diagnostic Results in last 30 days:  DG Chest Portable 1 View  Result Date: 01/05/2021 CLINICAL DATA:  Cough EXAM: PORTABLE CHEST 1 VIEW COMPARISON:  01/11/2020. FINDINGS: Mild enlargement the cardiac silhouette. No consolidation. Scattered calcified granulomas, better characterized on recent CT chest. No visible pleural effusions or pneumothorax. No acute osseous abnormality. Thoracic inlet clips. IMPRESSION: 1. No evidence of acute cardiopulmonary disease. 2. Chronic cardiomegaly and central pulmonary vascular congestion. Electronically Signed   By: 01/13/2020 MD   On: 01/05/2021 14:32    Assessment/Plan  1. Hypothyroidism due to Hashimoto's thyroiditis Lab Results  Component Value Date   TSH 53.317 (H) 01/05/2021   -   Continue levothyroxine  2. Pulmonary emphysema, unspecified emphysema type (HCC) -  No wheezing/SOB, on room air  3. Rheumatoid arthritis involving multiple sites, unspecified whether rheumatoid factor present (HCC) -   Stable, continue leflunomide and prednisone  4. Gastroesophageal reflux disease without esophagitis -   Stable, continue pantoprazole     Family/ staff Communication: Discussed plan of care with resident and charge  nurse.  Labs/tests ordered: None  Goals of care:   Short-term care   01/07/2021, DNP, MSN, FNP-BC [facility] [phone] (Monday-Friday 8:00 a.m. - 5:00 p.m.) [phone] (after hours)

## 2021-01-25 LAB — CBC AND DIFFERENTIAL
HCT: 30 — AB (ref 36–46)
Hemoglobin: 9.9 — AB (ref 12.0–16.0)
Platelets: 97 — AB (ref 150–399)
WBC: 5.1

## 2021-01-25 LAB — COMPREHENSIVE METABOLIC PANEL
Calcium: 8.8 (ref 8.7–10.7)
GFR calc Af Amer: 63.34
GFR calc non Af Amer: 54.65

## 2021-01-25 LAB — BASIC METABOLIC PANEL
BUN: 31 — AB (ref 4–21)
CO2: 24 — AB (ref 13–22)
Chloride: 100 (ref 99–108)
Creatinine: 1.1 (ref 0.5–1.1)
Glucose: 88
Potassium: 4.5 (ref 3.4–5.3)
Sodium: 136 — AB (ref 137–147)

## 2021-01-25 LAB — CBC: RBC: 3.21 — AB (ref 3.87–5.11)

## 2021-01-30 ENCOUNTER — Non-Acute Institutional Stay (INDEPENDENT_AMBULATORY_CARE_PROVIDER_SITE_OTHER): Payer: Self-pay | Admitting: Adult Health

## 2021-01-30 ENCOUNTER — Encounter: Payer: Self-pay | Admitting: Adult Health

## 2021-01-30 DIAGNOSIS — J9601 Acute respiratory failure with hypoxia: Secondary | ICD-10-CM

## 2021-01-30 DIAGNOSIS — E782 Mixed hyperlipidemia: Secondary | ICD-10-CM

## 2021-01-30 DIAGNOSIS — R531 Weakness: Secondary | ICD-10-CM

## 2021-01-30 DIAGNOSIS — E038 Other specified hypothyroidism: Secondary | ICD-10-CM

## 2021-01-30 DIAGNOSIS — E063 Autoimmune thyroiditis: Secondary | ICD-10-CM

## 2021-01-30 DIAGNOSIS — Z86711 Personal history of pulmonary embolism: Secondary | ICD-10-CM

## 2021-01-30 DIAGNOSIS — U071 COVID-19: Secondary | ICD-10-CM

## 2021-01-30 DIAGNOSIS — M069 Rheumatoid arthritis, unspecified: Secondary | ICD-10-CM

## 2021-01-30 DIAGNOSIS — K219 Gastro-esophageal reflux disease without esophagitis: Secondary | ICD-10-CM

## 2021-01-30 NOTE — Progress Notes (Incomplete)
Location:  [facility] Room Number: 227 [id number] Place of Service:  SNF (31) Provider:  Kenard Gower, DNP, FNP-BC  Patient Care Team: Chyrel Masson as PCP - General (Physician Assistant)  Extended Emergency Contact Information Primary Emergency Contact: [name] Mobile Phone: [phone] Relation: Son Secondary Emergency Contact: Melida Gimenez Mobile Phone: [phone] Relation: Daughter  Code Status:  Full Code  Goals of care: Advanced Directive information Advanced Directives 01/11/2021  Does Patient Have a Medical Advance Directive? No  Would patient like information on creating a medical advance directive? No - Patient declined     Chief Complaint  Patient presents with  . Discharge Note    HPI:   Pt is a 65 y.o. female who is for discharge home on 01/30/21 with Home health PT and OT.   She was admitted to [facility] on 01/10/21 post hospitalization on 01/05/21 to 01/10/21 for COVID-19 infection.  Chest x-ray was negative for pneumonia.  She got 5 days of remdesivir and Decadron.  At [facility], she used O2 @ 2L/min via [place] continuously since her O2 sats drops to 84% when she is on room air during activities. O2 sats go up to 95% when she has O2.  Patient was admitted to this facility for short-term rehabilitation after the patient's recent hospitalization.  Patient has completed SNF rehabilitation and therapy has cleared the patient for discharge.    Past Medical History:  Diagnosis Date  . Arthritis   . Dyspnea    W/ EXERTION, +  HUMIDITY   . Emphysema lung (HCC)   . GERD (gastroesophageal reflux disease)   . Hashimoto's thyroiditis   . Heart murmur   . 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 HTN (hypertension)   . Hypercholesteremia   . Hypothyroid   . Rheumatoid arthritis(714.0)   . Varicose vein of leg    Past Surgical History:  Procedure Laterality Date  . APPENDECTOMY    . CHOLECYSTECTOMY    . ELBOW  SURGERY     LEFT  . ENDOSCOPIC VEIN LASER TREATMENT     RIGHT LEG  . EXPLORATORY LAPAROTOMY    . HAND SURGERY     RIGHT   . HEEL SPUR SURGERY     BILAT  . INSERTION OF MESH N/A 08/04/2017   Procedure: INSERTION OF MESH;  Surgeon: Darnell Level, MD;  Location: [facility] OR;  Service: General;  Laterality: N/A;  . partial thyroidectomy  2015  . TONSILLECTOMY    . TUBAL LIGATION    . VENTRAL HERNIA REPAIR N/A 08/04/2017   Procedure: LAPAROSCOPIC VENTRAL INCISIONAL HERNIA REPAIR WITH MESH;  Surgeon: Darnell Level, MD;  Location: [facility] OR;  Service: General;  Laterality: N/A;    Allergies  Allergen Reactions  . Oxycodone-Acetaminophen Nausea Only and Other (See Comments)    Violent Vomiting Violent Vomiting  . Penicillins Anaphylaxis, Swelling and Rash    Has patient had a PCN reaction causing immediate rash, facial/tongue/throat swelling, SOB or lightheadedness with hypotension: Yes Has patient had a PCN reaction causing severe rash involving mucus membranes or skin necrosis: No Has patient had a PCN reaction that required hospitalization No Has patient had a PCN reaction occurring within the last 10 years: No If all of the above answers are "NO", then may proceed with Cephalosporin use.   . Latex Hives and Itching    Blisters (also)  . Oxycodone-Acetaminophen Nausea And Vomiting  . Tyloxapol Nausea And Vomiting  . Adhesive [Tape] Rash  Patient prefers paper tape  . Strawberry Extract Hives, Itching and Rash    Outpatient Encounter Medications as of 01/30/2021  Medication Sig  . Ascorbic Acid (VITAMIN C) 1000 MG tablet Take 1,000 mg by mouth daily.  . cholecalciferol (VITAMIN D) 1000 units tablet take 1 tablet by mouth once daily  . leflunomide (ARAVA) 10 MG tablet Take 10 mg by mouth See admin instructions. Monday-friday  . levothyroxine (SYNTHROID) 150 MCG tablet Take 150 mcg by mouth daily before breakfast.  . Multiple Vitamins-Minerals (ONE-A-DAY WOMENS 50+ ADVANTAGE) TABS Take 1  tablet by mouth daily.  . ondansetron (ZOFRAN ODT) 4 MG disintegrating tablet Take 1 tablet (4 mg total) by mouth every 8 (eight) hours as needed.  . pantoprazole (PROTONIX) 40 MG tablet TAKE TWO TABLETS BY MOUTH AT BEDTIME  . polyethylene glycol (MIRALAX / GLYCOLAX) 17 g packet Take 17 g by mouth daily as needed for mild constipation.  . predniSONE (DELTASONE) 5 MG tablet TAKE ONE TABLET BY MOUTH ONE TIME DAILY  . senna-docusate (SENOKOT-S) 8.6-50 MG tablet Take 1 tablet by mouth at bedtime as needed for mild constipation or moderate constipation.  . simvastatin (ZOCOR) 20 MG tablet Take 1 tablet (20 mg total) by mouth daily at 6 PM.  . traMADol (ULTRAM) 50 MG tablet TAKE 1 TABLET (50MG ) BY MOUTH 3 TIMES DAILY AS NEEDED FOR MODERATE OR SEVERE PAIN  . XARELTO 20 MG TABS tablet Take 20 mg by mouth daily.   No facility-administered encounter medications on file as of 01/30/2021.    Review of Systems  GENERAL: No change in appetite, no fatigue, no weight changes, no fever, chills or weakness SKIN: Denies rash, itching, wounds, ulcer sores, or nail abnormalities EYES: Denies change in vision, dry eyes, eye pain, itching or discharge EARS: Denies change in hearing, ringing in ears, or earache NOSE: Denies nasal congestion or epistaxis MOUTH and THROAT: Denies oral discomfort, gingival pain or bleeding, pain from teeth or hoarseness   RESPIRATORY: no cough, SOB, DOE, wheezing, hemoptysis CARDIAC: No chest pain, edema or palpitations GI: No abdominal pain, diarrhea, constipation, heart burn, nausea or vomiting GU: Denies dysuria, frequency, hematuria, incontinence, or discharge MUSCULOSKELETAL: Denies joint pain, muscle pain, back pain, restricted movement, or unusual weakness CIRCULATION: Denies claudication, edema of legs, varicosities, or cold extremities NEUROLOGICAL: Denies dizziness, syncope, numbness, or headache PSYCHIATRIC: Denies feelings of depression or anxiety. No report of  hallucinations, insomnia, paranoia, or agitation ENDOCRINE: Denies polyphagia, polyuria, polydipsia, heat or cold intolerance HEME/LYMPH: Denies excessive bruising, petechia, enlarged lymph nodes, or bleeding problems IMMUNOLOGIC: Denies history of frequent infections, AIDS, or use of immunosuppressive agents   Immunization History  Administered Date(s) Administered  . Influenza Whole 09/06/2008  . Influenza,inj,Quad PF,6+ Mos 09/02/2018  . Influenza,inj,quad, With Preservative 10/13/2019  . PFIZER(Purple Top)SARS-COV-2 Vaccination 04/14/2020, 05/12/2020  . Pneumococcal Conjugate-13 05/05/2017   Pertinent  Health Maintenance Due  Topic Date Due  . PAP SMEAR-Modifier  Never done  . COLONOSCOPY (Pts 45-49yrs Insurance coverage will need to be confirmed)  Never done  . MAMMOGRAM  Never done  . INFLUENZA VACCINE  07/16/2020   Fall Risk  10/28/2018 09/02/2018 08/04/2018 06/03/2018 04/21/2018  Falls in the past year? 1 No No Yes Yes  Number falls in past yr: 0 - - 2 or more 2 or more  Comment - - - - -  Injury with Fall? 0 - - Yes Yes  Comment - - - - bruising  Risk Factor Category  - - - High  Fall Risk High Fall Risk  Comment - - - - -  Risk for fall due to : - - - Impaired balance/gait;History of fall(s) -  Risk for fall due to: Comment - - - - -  Follow up - - - - -     Vitals:   01/30/21 1100  BP: 132/78  Pulse: 91  Resp: 18  Temp: 97.9 F (36.6 C)  Weight: 215 lb 9.6 oz (97.8 kg)  Height:  (1.575 m)   Body mass index is 39.43 kg/m.  Physical Exam  GENERAL APPEARANCE: Well nourished. In no acute distress. Normal body habitus SKIN:  Skin is warm and dry. There are no suspicious lesions or rash HEAD: Normal in size and contour. No evidence of trauma EYES: Lids open and close normally. No blepharitis, entropion or ectropion. PERRL. Conjunctivae are clear and sclerae are white. Lenses are without opacity EARS: Pinnae are normal. Patient hears normal voice tunes of the  examiner MOUTH and THROAT: Lips are without lesions. Oral mucosa is moist and without lesions. Tongue is normal in shape, size, and color and without lesions NECK: supple, trachea midline, no neck masses, no thyroid tenderness, no thyromegaly LYMPHATICS: No LAN in the neck, no supraclavicular LAN RESPIRATORY: Breathing is even & unlabored, BS CTAB CARDIAC: RRR, no murmur,no extra heart sounds, no edema GI: Abdomen soft, normal BS, no masses, no tenderness, no hepatomegaly, no splenomegaly MUSCULOSKELETAL: No deformities. Movement at each extremity is full and painless. Strength is 5/5 at each extremity. Back is without kyphosis or scoliosis CIRCULATION: Pedal pulses are 2+. There is no edema of the legs, ankles and feet NEUROLOGICAL: There is no tremor. Speech is clear PSYCHIATRIC: Alert and oriented X 3. Affect and behavior are appropriate  Labs reviewed: Recent Labs    01/07/21 0332 01/08/21 0421 01/09/21 0500 01/18/21 0000  NA 141 137 136 139  K 4.0 4.2 4.4 4.5  CL 112* 109 107 103  CO2 22 21* 21* 26*  GLUCOSE 147* 138* 162*  --   BUN 28* 35* 38* 31*  CREATININE 1.36* 1.48* 1.47* 1.3*  CALCIUM 7.8* 8.3* 8.3* 8.9   Recent Labs    01/07/21 0332 01/08/21 0421 01/09/21 0500  AST 21 27 65*  ALT 15 20 43  ALKPHOS 71 66 67  BILITOT 0.7 0.5 0.6  PROT 5.2* 5.1* 5.0*  ALBUMIN 2.1* 2.1* 2.1*   Recent Labs    01/05/21 1427 01/06/21 0237 01/07/21 0332 01/08/21 0421 01/09/21 0500 01/18/21 0000  WBC 5.9   < > 3.6* 5.9 4.1 6.4  NEUTROABS 4.4  --   --   --   --  4.80  HGB 12.3   < > 10.6* 10.8* 10.7* 11.0*  HCT 39.9   < > 34.2* 34.7* 33.8* 33*  MCV 97.1   < > 97.2 96.4 96.3  --   PLT 142*   < > 132* 168 168 133*   < > = values in this interval not displayed.   Lab Results  Component Value Date   TSH 53.317 (H) 01/05/2021   Lab Results  Component Value Date   HGBA1C 5.2 11/14/2016   Lab Results  Component Value Date   CHOL 224 (H) 11/15/2016   HDL 51 11/15/2016    LDLCALC 142 (H) 11/15/2016   TRIG 157 (H) 11/15/2016   CHOLHDL 4.4 11/15/2016    Significant Diagnostic Results in last 30 days:  DG Chest Portable 1 View  Result Date: 01/05/2021 CLINICAL DATA:  Cough EXAM: PORTABLE CHEST 1 VIEW COMPARISON:  01/11/2020. FINDINGS: Mild enlargement the cardiac silhouette. No consolidation. Scattered calcified granulomas, better characterized on recent CT chest. No visible pleural effusions or pneumothorax. No acute osseous abnormality. Thoracic inlet clips. IMPRESSION: 1. No evidence of acute cardiopulmonary disease. 2. Chronic cardiomegaly and central pulmonary vascular congestion. Electronically Signed   By: Feliberto Harts MD   On: 01/05/2021 14:32    Assessment/Plan ***   Family/ staff Communication: ***  Labs/tests ordered:  ***  Goals of care:   ***   Kenard Gower, DNP, MSN, FNP-BC [facility] [phone] (Monday-Friday 8:00 a.m. - 5:00 p.m.) [phone] (after hours)

## 2021-01-30 NOTE — Progress Notes (Addendum)
Location:  [facility] Room Number: 227 A Place of Service:  SNF (31) Provider:  Kenard Gower, DNP, FNP-BC  Patient Care Team: Chyrel Masson as PCP - General (Physician Assistant)  Extended Emergency Contact Information Primary Emergency Contact: [name] Mobile Phone: [phone] Relation: Son Secondary Emergency Contact: [name] Mobile Phone: [phone] Relation: Daughter  Code Status:  Full Code  Goals of care: Advanced Directive information Advanced Directives 01/31/2021  Does Patient Have a Medical Advance Directive? No  Would patient like information on creating a medical advance directive? No - Patient declined   Addendum:  01/31/21  Resident will stay at the facility for continued rehabilitation and not discharge to home.    Chief Complaint  Patient presents with  . Acute Visit    Was supposed to be for discharge but will now stay at facility for more rehabilitation    HPI:   Pt is a 65 y.o. [name] who is for discharge home on 01/30/21 with Home health PT and OT.   She was admitted to [facility] on 01/10/21 post hospitalization on 01/05/21 to 01/10/21 for COVID-19 infection.  Chest x-ray was negative for pneumonia.  She got 5 days of remdesivir and Decadron.  At [facility], she used O2 @ 2L/min via [place] continuously since her O2 sats drops to 84% when she is on room air during activities. O2 sats go up to 95% when she has O2.She will need O2 when she discharges.  Patient was admitted to this facility for short-term rehabilitation after the patient's recent hospitalization.  Patient has completed SNF rehabilitation and therapy has cleared the patient for discharge.   Past Medical History:  Diagnosis Date  . Arthritis   . Dyspnea    W/ EXERTION, +  HUMIDITY   . Emphysema lung (HCC)   . GERD (gastroesophageal reflux disease)   . Hashimoto's thyroiditis   . Heart murmur   . 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 HTN (hypertension)   . Hypercholesteremia   . Hypothyroid   . Rheumatoid arthritis(714.0)   . Varicose vein of leg    Past Surgical History:  Procedure Laterality Date  . APPENDECTOMY    . CHOLECYSTECTOMY    . ELBOW SURGERY     LEFT  . ENDOSCOPIC VEIN LASER TREATMENT     RIGHT LEG  . EXPLORATORY LAPAROTOMY    . HAND SURGERY     RIGHT   . HEEL SPUR SURGERY     BILAT  . INSERTION OF MESH N/A 08/04/2017   Procedure: INSERTION OF MESH;  Surgeon: Darnell Level, MD;  Location: [facility] OR;  Service: General;  Laterality: N/A;  . partial thyroidectomy  2015  . TONSILLECTOMY    . TUBAL LIGATION    . VENTRAL HERNIA REPAIR N/A 08/04/2017   Procedure: LAPAROSCOPIC VENTRAL INCISIONAL HERNIA REPAIR WITH MESH;  Surgeon: Darnell Level, MD;  Location: [facility] OR;  Service: General;  Laterality: N/A;    Allergies  Allergen Reactions  . Oxycodone-Acetaminophen Nausea Only and Other (See Comments)    Violent Vomiting Violent Vomiting  . Penicillins Anaphylaxis, Swelling and Rash    Has patient had a PCN reaction causing immediate rash, facial/tongue/throat swelling, SOB or lightheadedness with hypotension: Yes Has patient had a PCN reaction causing severe rash involving mucus membranes or skin necrosis: No Has patient had a PCN reaction that required hospitalization No Has patient had a PCN reaction occurring within the last 10 years: No If all of  the above answers are "NO", then may proceed with Cephalosporin use.   . Latex Hives and Itching    Blisters (also)  . Oxycodone-Acetaminophen Nausea And Vomiting  . Tyloxapol Nausea And Vomiting  . Adhesive [Tape] Rash    Patient prefers paper tape  . Strawberry Extract Hives, Itching and Rash    Outpatient Encounter Medications as of 01/30/2021  Medication Sig  . Ascorbic Acid (VITAMIN C) 1000 MG tablet Take 1 tablet (1,000 mg total) by mouth daily.  . cholecalciferol (VITAMIN D) 1000 units tablet take 1 tablet by mouth once daily  .  leflunomide (ARAVA) 10 MG tablet Take 1 tablet (10 mg total) by mouth See admin instructions. Monday-friday  . levothyroxine (SYNTHROID) 150 MCG tablet Take 1 tablet (150 mcg total) by mouth daily before breakfast.  . Multiple Vitamins-Minerals (ONE-A-DAY WOMENS 50+ ADVANTAGE) TABS Take 1 tablet by mouth daily.  . ondansetron (ZOFRAN ODT) 4 MG disintegrating tablet Take 1 tablet (4 mg total) by mouth every 8 (eight) hours as needed.  . pantoprazole (PROTONIX) 40 MG tablet TAKE TWO TABLETS BY MOUTH AT BEDTIME  . polyethylene glycol (MIRALAX / GLYCOLAX) 17 g packet Take 17 g by mouth daily as needed for mild constipation.  . predniSONE (DELTASONE) 5 MG tablet Take 1 tablet (5 mg total) by mouth daily.  Marland Kitchen senna-docusate (SENOKOT-S) 8.6-50 MG tablet Take 1 tablet by mouth at bedtime as needed for mild constipation or moderate constipation.  . simvastatin (ZOCOR) 20 MG tablet Take 1 tablet (20 mg total) by mouth daily at 6 PM.  . traMADol (ULTRAM) 50 MG tablet TAKE 1 TABLET ( ) BY MOUTH 3 TIMES DAILY AS NEEDED FOR MODERATE OR SEVERE PAIN  . XARELTO 20 MG TABS tablet Take 1 tablet (20 mg total) by mouth daily.  . [DISCONTINUED] Ascorbic Acid (VITAMIN C) 1000 MG tablet Take 1,000 mg by mouth daily.  . [DISCONTINUED] leflunomide (ARAVA) 10 MG tablet Take 10 mg by mouth See admin instructions. Monday-friday  . [DISCONTINUED] levothyroxine (SYNTHROID) 150 MCG tablet Take 150 mcg by mouth daily before breakfast.  . [DISCONTINUED] pantoprazole (PROTONIX) 40 MG tablet TAKE TWO TABLETS BY MOUTH AT BEDTIME  . [DISCONTINUED] predniSONE (DELTASONE) 5 MG tablet TAKE ONE TABLET BY MOUTH ONE TIME DAILY  . [DISCONTINUED] simvastatin (ZOCOR) 20 MG tablet Take 1 tablet (20 mg total) by mouth daily at 6 PM.  . [DISCONTINUED] traMADol (ULTRAM) 50 MG tablet TAKE 1 TABLET ( ) BY MOUTH 3 TIMES DAILY AS NEEDED FOR MODERATE OR SEVERE PAIN  . [DISCONTINUED] XARELTO 20 MG TABS tablet Take 20 mg by mouth daily.   No  facility-administered encounter medications on file as of 01/30/2021.    Review of Systems  GENERAL: No change in appetite, no fatigue, no weight changes, no fever, chills or weakness MOUTH and THROAT: Denies oral discomfort, gingival pain or bleeding RESPIRATORY: no cough, wheezing, hemoptysis CARDIAC: No chest pain, edema or palpitations GI: No abdominal pain, diarrhea, constipation, heart burn, nausea or vomiting GU: Denies dysuria, frequency, hematuria, incontinence, or discharge NEUROLOGICAL: Denies dizziness, syncope, numbness, or headache PSYCHIATRIC: Denies feelings of depression or anxiety. No report of hallucinations, insomnia, paranoia, or agitation   Immunization History  Administered Date(s) Administered  . Influenza Whole 09/06/2008  . Influenza,inj,Quad PF,6+ Mos 09/02/2018  . Influenza,inj,quad, With Preservative 10/13/2019  . PFIZER(Purple Top)SARS-COV-2 Vaccination 04/14/2020, 05/12/2020  . Pneumococcal Conjugate-13 05/05/2017   Pertinent  Health Maintenance Due  Topic Date Due  . PAP SMEAR-Modifier  Never done  . COLONOSCOPY (  Pts 45-33yrs Insurance coverage will need to be confirmed)  Never done  . MAMMOGRAM  Never done  . INFLUENZA VACCINE  07/16/2020   Fall Risk  10/28/2018 09/02/2018 08/04/2018 06/03/2018 04/21/2018  Falls in the past year? 1 No No Yes Yes  Number falls in past yr: 0 - - 2 or more 2 or more  Comment - - - - -  Injury with Fall? 0 - - Yes Yes  Comment - - - - bruising  Risk Factor Category  - - - High Fall Risk High Fall Risk  Comment - - - - -  Risk for fall due to : - - - Impaired balance/gait;History of fall(s) -  Risk for fall due to: Comment - - - - -  Follow up - - - - -     Vitals:   01/30/21 1100  BP: 132/78  Pulse: 91  Resp: 18  Temp: 97.9 F (36.6 C)  Weight: 215 lb 9.6 oz (97.8 kg)  Height: 5\' 2"  (1.575 m)   Body mass index is 39.43 kg/m.  Physical Exam  GENERAL APPEARANCE: Well nourished. In no acute distress.  Morbidly obese SKIN:  Skin is warm and dry.  MOUTH and THROAT: Lips are without lesions. Oral mucosa is moist and without lesions. Tongue is normal in shape, size, and color and without lesions RESPIRATORY: Breathing is even & unlabored, BS CTAB CARDIAC: RRR, no murmur,no extra heart sounds, no edema GI: Abdomen soft, normal BS, no masses, no tenderness EXTREMITIES:  Able to move X 4 extremities NEUROLOGICAL: There is no tremor. Speech is clear. Alert and oriented X 3. PSYCHIATRIC:  Affect and behavior are appropriate  Labs reviewed: Recent Labs    01/07/21 0332 01/08/21 0421 01/09/21 0500 01/18/21 0000 01/25/21 0000  NA 141 137 136 139 136*  K 4.0 4.2 4.4 4.5 4.5  CL 112* 109 107 103 100  CO2 22 21* 21* 26* 24*  GLUCOSE 147* 138* 162*  --   --   BUN 28* 35* 38* 31* 31*  CREATININE 1.36* 1.48* 1.47* 1.3* 1.1  CALCIUM 7.8* 8.3* 8.3* 8.9 8.8   Recent Labs    01/07/21 0332 01/08/21 0421 01/09/21 0500  AST 21 27 65*  ALT 15 20 43  ALKPHOS 71 66 67  BILITOT 0.7 0.5 0.6  PROT 5.2* 5.1* 5.0*  ALBUMIN 2.1* 2.1* 2.1*   Recent Labs    01/05/21 1427 01/06/21 0237 01/07/21 0332 01/08/21 0421 01/09/21 0500 01/18/21 0000 01/25/21 0000  WBC 5.9   < > 3.6* 5.9 4.1 6.4 5.1  NEUTROABS 4.4  --   --   --   --  4.80  --   HGB 12.3   < > 10.6* 10.8* 10.7* 11.0* 9.9*  HCT 39.9   < > 34.2* 34.7* 33.8* 33* 30*  MCV 97.1   < > 97.2 96.4 96.3  --   --   PLT 142*   < > 132* 168 168 133* 97*   < > = values in this interval not displayed.   Lab Results  Component Value Date   TSH 53.317 (H) 01/05/2021   Lab Results  Component Value Date   HGBA1C 5.2 11/14/2016   Lab Results  Component Value Date   CHOL 224 (H) 11/15/2016   HDL 51 11/15/2016   LDLCALC 142 (H) 11/15/2016   TRIG 157 (H) 11/15/2016   CHOLHDL 4.4 11/15/2016    Significant Diagnostic Results in last 30 days:  DG Chest Portable  1 View  Result Date: 01/05/2021 CLINICAL DATA:  Cough EXAM: PORTABLE CHEST 1 VIEW  COMPARISON:  01/11/2020. FINDINGS: Mild enlargement the cardiac silhouette. No consolidation. Scattered calcified granulomas, better characterized on recent CT chest. No visible pleural effusions or pneumothorax. No acute osseous abnormality. Thoracic inlet clips. IMPRESSION: 1. No evidence of acute cardiopulmonary disease. 2. Chronic cardiomegaly and central pulmonary vascular congestion. Electronically Signed   By: Feliberto Harts MD   On: 01/05/2021 14:32    Assessment/Plan  1. COVID-19 -   S/P 5 days of remdesivir and Decadron - Ascorbic Acid (VITAMIN C) 1000 MG tablet; Take 1 tablet (1,000 mg total) by mouth daily.  Dispense: 30 tablet; Refill: 0   2. Hypothyroidism due to Hashimoto's thyroiditis - levothyroxine (SYNTHROID) 150 MCG tablet; Take 1 tablet (150 mcg total) by mouth daily before breakfast.  Dispense: 30 tablet; Refill: 0  3. Rheumatoid arthritis involving multiple sites, unspecified whether rheumatoid factor present (HCC) - traMADol (ULTRAM) 50 MG tablet; TAKE 1 TABLET (50MG ) BY MOUTH 3 TIMES DAILY AS NEEDED FOR MODERATE OR SEVERE PAIN  Dispense: 15 tablet; Refill: 0 - leflunomide (ARAVA) 10 MG tablet; Take 1 tablet (10 mg total) by mouth See admin instructions. Monday-friday  Dispense: 20 tablet; Refill: 0 - predniSONE (DELTASONE) 5 MG tablet; Take 1 tablet (5 mg total) by mouth daily.  Dispense: 30 tablet; Refill: 0  5. History of pulmonary embolism - XARELTO 20 MG TABS tablet; Take 1 tablet (20 mg total) by mouth daily.  Dispense: 30 tablet; Refill: 0  6. Mixed hyperlipidemia - simvastatin (ZOCOR) 20 MG tablet; Take 1 tablet (20 mg total) by mouth daily at 6 PM.  Dispense: 30 tablet; Refill: 0  7. Gastroesophageal reflux disease without esophagitis - pantoprazole (PROTONIX) 40 MG tablet; TAKE TWO TABLETS BY MOUTH AT BEDTIME  Dispense: 60 tablet; Refill: 0  8. Acute respiratory failure with hypoxia (HCC) -  Continue O2 @ 2L/min via [place]  9. Generalized weakness -   For Home health PT and OT for therapeutic strengthening exercises     I have filled out patient's discharge paperwork and e-prescribed medications.  Patient will have home health PT and OT  DME provided: Wheelchair and O2 at 2 L/minute via [place]  Wheelchair  -she suffers from generalized weakness due to COVID-19 infection and has rheumatoid arthritis which impairs her ability to perform daily activities like toileting, feeding, dressing, grooming and bathing in the home.  A cane or walker will not resolve issue with performing activities of daily living.  A wheelchair will allow patient to safely perform daily activities.  Patient can safely propel the wheelchair in the home.  O2 at 2L/min --   O2 sat on room air 84%                              O2 sat on O2 at 2L/min  95%       Total discharge time: Greater than 30 minutes Greater than 50% was spent in counseling and coordination of care.   Discharge time involved coordination of the discharge process with social worker, nursing staff and therapy department. Medical justification for home health services/DME verified.     , DNP, MSN, FNP-BC [facility] [phone] (Monday-Friday 8:00 a.m. - 5:00 p.m.) [phone] (after hours)

## 2021-01-31 ENCOUNTER — Encounter: Payer: Self-pay | Admitting: Adult Health

## 2021-01-31 ENCOUNTER — Non-Acute Institutional Stay (SKILLED_NURSING_FACILITY): Payer: Self-pay | Admitting: Adult Health

## 2021-01-31 DIAGNOSIS — Z86711 Personal history of pulmonary embolism: Secondary | ICD-10-CM

## 2021-01-31 DIAGNOSIS — E063 Autoimmune thyroiditis: Secondary | ICD-10-CM

## 2021-01-31 DIAGNOSIS — Z7189 Other specified counseling: Secondary | ICD-10-CM

## 2021-01-31 DIAGNOSIS — E038 Other specified hypothyroidism: Secondary | ICD-10-CM

## 2021-01-31 DIAGNOSIS — J439 Emphysema, unspecified: Secondary | ICD-10-CM

## 2021-01-31 DIAGNOSIS — U071 COVID-19: Secondary | ICD-10-CM

## 2021-01-31 DIAGNOSIS — R531 Weakness: Secondary | ICD-10-CM

## 2021-01-31 MED ORDER — SIMVASTATIN 20 MG PO TABS: 20.0000 mg | ORAL_TABLET | Freq: Every day | ORAL | 0 refills | Status: DC

## 2021-01-31 MED ORDER — LEVOTHYROXINE SODIUM 150 MCG PO TABS: 150.0000 ug | ORAL_TABLET | Freq: Every day | ORAL | 0 refills | Status: DC

## 2021-01-31 MED ORDER — XARELTO 20 MG PO TABS: 20.0000 mg | ORAL_TABLET | Freq: Every day | ORAL | 0 refills | Status: DC

## 2021-01-31 MED ORDER — PANTOPRAZOLE SODIUM 40 MG PO TBEC
DELAYED_RELEASE_TABLET | ORAL | 0 refills | Status: DC
Start: 2021-01-31 — End: 2024-05-24

## 2021-01-31 MED ORDER — PREDNISONE 5 MG PO TABS
5.0000 mg | ORAL_TABLET | Freq: Every day | ORAL | 0 refills | Status: DC
Start: 2021-01-31 — End: 2024-05-24

## 2021-01-31 MED ORDER — VITAMIN C 1000 MG PO TABS: 1000.0000 mg | ORAL_TABLET | Freq: Every day | ORAL | 0 refills | Status: DC

## 2021-01-31 MED ORDER — TRAMADOL HCL 50 MG PO TABS: ORAL_TABLET | ORAL | 0 refills | Status: DC

## 2021-01-31 MED ORDER — LEFLUNOMIDE 10 MG PO TABS: 10.0000 mg | ORAL_TABLET | ORAL | 0 refills | Status: DC

## 2021-01-31 NOTE — Addendum Note (Signed)
Addended by: Kenard Gower C on: 01/31/2021 11:58 AM   Modules accepted: Level of Service

## 2021-01-31 NOTE — Progress Notes (Signed)
Location:  [facility] Room Number: 227/A Place of Service:  SNF (31) Provider:  Kenard Gower, DNP, FNP-BC  Patient Care Team: Chyrel Masson as PCP - General (Physician Assistant)  Extended Emergency Contact Information Primary Emergency Contact: [name] Mobile Phone: [phone] Relation: Son Secondary Emergency Contact: Melida Gimenez Mobile Phone: [phone] Relation: Daughter  Code Status:  Full Code  Goals of care: Advanced Directive information Advanced Directives 01/31/2021  Does Patient Have a Medical Advance Directive? No  Would patient like information on creating a medical advance directive? No - Patient declined     Chief Complaint  Patient presents with  . Acute Visit    Care Plan Meeting     HPI:  Pt is a 65 y.o. female who had a care lan meeting today attended by resident, son Thayer [place]), daughter-in-law Melida Gimenez), Child psychotherapist, life Arts administrator, DON, Engineer, maintenance, and PT. She remains to be full code. Discussed medications and vital signs. Discussed about her O2 sats going down to 80s during activities. She is needing O2 at 2L/min at all times. Home health O2 called her and she apparently cancelled since it was "too expensive".  This morning, she said another company called her and she said that if she needs to have O2 then she agrees to have it so she can go home. Discussed what are the plans for her discharge. Therapy discussed that resident has occasional refusal to participate. She lives with her son at home who is still in the hospital due to COVID-19. Family discussed that they wanted her to stay at  [facility] since they are working and there is nobody to help her at home. The concern is that there is nobody at home in case she has emergency. It was reported that she is not compliant with her oxygen. Business person discussed about private pay and son that private pay is not an option. It was  discussed that resident will stay at facility for continued rehabilitation. Family will discussed other options.    Past Medical History:  Diagnosis Date  . Arthritis   . Dyspnea    W/ EXERTION, +  HUMIDITY   . Emphysema lung (HCC)   . GERD (gastroesophageal reflux disease)   . Hashimoto's thyroiditis   . Heart murmur   . 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 HTN (hypertension)   . Hypercholesteremia   . Hypothyroid   . Rheumatoid arthritis(714.0)   . Varicose vein of leg    Past Surgical History:  Procedure Laterality Date  . APPENDECTOMY    . CHOLECYSTECTOMY    . ELBOW SURGERY     LEFT  . ENDOSCOPIC VEIN LASER TREATMENT     RIGHT LEG  . EXPLORATORY LAPAROTOMY    . HAND SURGERY     RIGHT   . HEEL SPUR SURGERY     BILAT  . INSERTION OF MESH N/A 08/04/2017   Procedure: INSERTION OF MESH;  Surgeon: Darnell Level, MD;  Location: [facility] OR;  Service: General;  Laterality: N/A;  . partial thyroidectomy  2015  . TONSILLECTOMY    . TUBAL LIGATION    . VENTRAL HERNIA REPAIR N/A 08/04/2017   Procedure: LAPAROSCOPIC VENTRAL INCISIONAL HERNIA REPAIR WITH MESH;  Surgeon: Darnell Level, MD;  Location: [facility] OR;  Service: General;  Laterality: N/A;    Allergies  Allergen Reactions  . Oxycodone-Acetaminophen Nausea Only and Other (See Comments)    Violent Vomiting Violent Vomiting  . Penicillins Anaphylaxis, Swelling and  Rash    Has patient had a PCN reaction causing immediate rash, facial/tongue/throat swelling, SOB or lightheadedness with hypotension: Yes Has patient had a PCN reaction causing severe rash involving mucus membranes or skin necrosis: No Has patient had a PCN reaction that required hospitalization No Has patient had a PCN reaction occurring within the last 10 years: No If all of the above answers are "NO", then may proceed with Cephalosporin use.   . Latex Hives and Itching    Blisters (also)  . Oxycodone-Acetaminophen Nausea And Vomiting  . Tyloxapol Nausea And Vomiting  . Adhesive  [Tape] Rash    Patient prefers paper tape  . Strawberry Extract Hives, Itching and Rash    Outpatient Encounter Medications as of 01/31/2021  Medication Sig  . Ascorbic Acid (VITAMIN C) 1000 MG tablet Take 1 tablet (1,000 mg total) by mouth daily.  . cholecalciferol (VITAMIN D) 1000 units tablet take 1 tablet by mouth once daily  . leflunomide (ARAVA) 10 MG tablet Take 1 tablet (10 mg total) by mouth See admin instructions. Monday-friday  . levothyroxine (SYNTHROID) 150 MCG tablet Take 1 tablet (150 mcg total) by mouth daily before breakfast.  . Multiple Vitamins-Minerals (ONE-A-DAY WOMENS 50+ ADVANTAGE) TABS Take 1 tablet by mouth daily.  . ondansetron (ZOFRAN ODT) 4 MG disintegrating tablet Take 1 tablet (4 mg total) by mouth every 8 (eight) hours as needed.  . pantoprazole (PROTONIX) 40 MG tablet TAKE TWO TABLETS BY MOUTH AT BEDTIME  . polyethylene glycol (MIRALAX / GLYCOLAX) 17 g packet Take 17 g by mouth daily as needed for mild constipation.  . predniSONE (DELTASONE) 5 MG tablet Take 1 tablet (5 mg total) by mouth daily.  Marland Kitchen senna-docusate (SENOKOT-S) 8.6-50 MG tablet Take 1 tablet by mouth at bedtime as needed for mild constipation or moderate constipation.  . simvastatin (ZOCOR) 20 MG tablet Take 1 tablet (20 mg total) by mouth daily at 6 PM.  . traMADol (ULTRAM) 50 MG tablet TAKE 1 TABLET ( ) BY MOUTH 3 TIMES DAILY AS NEEDED FOR MODERATE OR SEVERE PAIN  . XARELTO 20 MG TABS tablet Take 1 tablet (20 mg total) by mouth daily.   No facility-administered encounter medications on file as of 01/31/2021.    Review of Systems  GENERAL: No change in appetite, no fatigue, no weight changes, no fever, chills or weakness MOUTH and THROAT: Denies oral discomfort, gingival pain or bleeding RESPIRATORY: has SOB CARDIAC: No chest pain, edema or palpitations GI: No abdominal pain, diarrhea, constipation, heart burn, nausea or vomiting GU: Denies dysuria, frequency, hematuria, incontinence, or  discharge NEUROLOGICAL: Denies dizziness, syncope, numbness, or headache PSYCHIATRIC: Denies feelings of depression or anxiety. No report of hallucinations, insomnia, paranoia, or agitation    Immunization History  Administered Date(s) Administered  . Influenza Whole 09/06/2008  . Influenza,inj,Quad PF,6+ Mos 09/02/2018  . Influenza,inj,quad, With Preservative 10/13/2019  . PFIZER(Purple Top)SARS-COV-2 Vaccination 04/14/2020, 05/12/2020  . Pneumococcal Conjugate-13 05/05/2017   Pertinent  Health Maintenance Due  Topic Date Due  . PAP SMEAR-Modifier  Never done  . COLONOSCOPY (Pts 45-24yrs Insurance coverage will need to be confirmed)  Never done  . MAMMOGRAM  Never done  . INFLUENZA VACCINE  07/16/2020   Fall Risk  10/28/2018 09/02/2018 08/04/2018 06/03/2018 04/21/2018  Falls in the past year? 1 No No Yes Yes  Number falls in past yr: 0 - - 2 or more 2 or more  Comment - - - - -  Injury with Fall? 0 - - Yes Yes  Comment - - - - bruising  Risk Factor Category  - - - High Fall Risk High Fall Risk  Comment - - - - -  Risk for fall due to : - - - Impaired balance/gait;History of fall(s) -  Risk for fall due to: Comment - - - - -  Follow up - - - - -     Vitals:   01/31/21 0943  BP: 114/67  Pulse: 90  Resp: 17  Temp: (!) 97 F (36.1 C)  SpO2: 97%  Weight: 215 lb 9.6 oz (97.8 kg)  Height: 5\' 2"  (1.575 m)   Body mass index is 39.43 kg/m.  Physical Exam  GENERAL APPEARANCE: Well nourished. In no acute distress. Morbidly obese SKIN:  Skin is warm and dry.  MOUTH and THROAT: Lips are without lesions. Oral mucosa is moist and without lesions. Tongue is normal in shape, size, and color and without lesions RESPIRATORY: Breathing is even & unlabored, BS CTAB, O2 @ 2L/min via [place] CARDIAC: RRR, no murmur,no extra heart sounds, no edema GI: Abdomen soft, normal BS, no masses, no tenderness EXTREMITIES:  Able to move X 4 extremities NEUROLOGICAL: There is no tremor. Speech is clear.  Alert and oriented X 3. PSYCHIATRIC:  Affect and behavior are appropriate  Labs reviewed: Recent Labs    01/07/21 0332 01/08/21 0421 01/09/21 0500 01/18/21 0000 01/25/21 0000  NA 141 137 136 139 136*  K 4.0 4.2 4.4 4.5 4.5  CL 112* 109 107 103 100  CO2 22 21* 21* 26* 24*  GLUCOSE 147* 138* 162*  --   --   BUN 28* 35* 38* 31* 31*  CREATININE 1.36* 1.48* 1.47* 1.3* 1.1  CALCIUM 7.8* 8.3* 8.3* 8.9 8.8   Recent Labs    01/07/21 0332 01/08/21 0421 01/09/21 0500  AST 21 27 65*  ALT 15 20 43  ALKPHOS 71 66 67  BILITOT 0.7 0.5 0.6  PROT 5.2* 5.1* 5.0*  ALBUMIN 2.1* 2.1* 2.1*   Recent Labs    01/05/21 1427 01/06/21 0237 01/07/21 0332 01/08/21 0421 01/09/21 0500 01/18/21 0000 01/25/21 0000  WBC 5.9   < > 3.6* 5.9 4.1 6.4 5.1  NEUTROABS 4.4  --   --   --   --  4.80  --   HGB 12.3   < > 10.6* 10.8* 10.7* 11.0* 9.9*  HCT 39.9   < > 34.2* 34.7* 33.8* 33* 30*  MCV 97.1   < > 97.2 96.4 96.3  --   --   PLT 142*   < > 132* 168 168 133* 97*   < > = values in this interval not displayed.   Lab Results  Component Value Date   TSH 53.317 (H) 01/05/2021   Lab Results  Component Value Date   HGBA1C 5.2 11/14/2016   Lab Results  Component Value Date   CHOL 224 (H) 11/15/2016   HDL 51 11/15/2016   LDLCALC 142 (H) 11/15/2016   TRIG 157 (H) 11/15/2016   CHOLHDL 4.4 11/15/2016    Significant Diagnostic Results in last 30 days:  DG Chest Portable 1 View  Result Date: 01/05/2021 CLINICAL DATA:  Cough EXAM: PORTABLE CHEST 1 VIEW COMPARISON:  01/11/2020. FINDINGS: Mild enlargement the cardiac silhouette. No consolidation. Scattered calcified granulomas, better characterized on recent CT chest. No visible pleural effusions or pneumothorax. No acute osseous abnormality. Thoracic inlet clips. IMPRESSION: 1. No evidence of acute cardiopulmonary disease. 2. Chronic cardiomegaly and central pulmonary vascular congestion. Electronically Signed  By: Feliberto Harts MD   On: 01/05/2021  14:32    Assessment/Plan  1. Advance care planning -   Remains to be full code -  Discussed medications, vital signs and weights  2. COVID-19 -  S/P 5 days of Remdesevir and Decadron -  Continue Vitamin C  3. History of pulmonary embolism -  Continue Xarelto  4. Pulmonary emphysema, unspecified emphysema type (HCC) -  Needing O2 @ 2L/min via [place]  5. Hypothyroidism due to Hashimoto's thyroiditis Lab Results  Component Value Date   TSH 53.317 (H) 01/05/2021   -  Continue Levothyroxine  6. Generalized weakness -  Continue PT and OT, for therapeutic strengthening exercises -  Family wanted resident to stay at facility and continue rehabilitation      Family/ staff Communication:  Discussed plan of care with resident, son, daughter-in-law and IDT.  Labs/tests ordered:  None  Goals of care:   Short-term care   Kenard Gower, DNP, MSN, FNP-BC [facility] [phone] (Monday-Friday 8:00 a.m. - 5:00 p.m.) [phone] (after hours)

## 2021-02-01 ENCOUNTER — Telehealth: Payer: Self-pay | Admitting: *Deleted

## 2021-02-01 ENCOUNTER — Encounter: Payer: Self-pay | Admitting: Adult Health

## 2021-02-01 ENCOUNTER — Non-Acute Institutional Stay (INDEPENDENT_AMBULATORY_CARE_PROVIDER_SITE_OTHER): Payer: Self-pay | Admitting: Adult Health

## 2021-02-01 DIAGNOSIS — M069 Rheumatoid arthritis, unspecified: Secondary | ICD-10-CM

## 2021-02-01 DIAGNOSIS — E782 Mixed hyperlipidemia: Secondary | ICD-10-CM

## 2021-02-01 DIAGNOSIS — N1831 Chronic kidney disease, stage 3a: Secondary | ICD-10-CM

## 2021-02-01 DIAGNOSIS — J9601 Acute respiratory failure with hypoxia: Secondary | ICD-10-CM

## 2021-02-01 DIAGNOSIS — J439 Emphysema, unspecified: Secondary | ICD-10-CM

## 2021-02-01 DIAGNOSIS — U071 COVID-19: Secondary | ICD-10-CM

## 2021-02-01 DIAGNOSIS — Z86711 Personal history of pulmonary embolism: Secondary | ICD-10-CM

## 2021-02-01 DIAGNOSIS — I5032 Chronic diastolic (congestive) heart failure: Secondary | ICD-10-CM

## 2021-02-01 DIAGNOSIS — E038 Other specified hypothyroidism: Secondary | ICD-10-CM

## 2021-02-01 DIAGNOSIS — K219 Gastro-esophageal reflux disease without esophagitis: Secondary | ICD-10-CM

## 2021-02-01 DIAGNOSIS — R531 Weakness: Secondary | ICD-10-CM

## 2021-02-01 DIAGNOSIS — E063 Autoimmune thyroiditis: Secondary | ICD-10-CM

## 2021-02-01 MED ORDER — POTASSIUM CHLORIDE ER 10 MEQ PO TBCR: 10.0000 meq | EXTENDED_RELEASE_TABLET | Freq: Every day | ORAL | 0 refills | Status: DC

## 2021-02-01 MED ORDER — FUROSEMIDE 20 MG PO TABS: 20.0000 mg | ORAL_TABLET | Freq: Every day | ORAL | 0 refills | Status: DC

## 2021-02-01 NOTE — Telephone Encounter (Signed)
[facility] called and stated that they have to do a Late Start of Care due to staffing. So instead of going to patient this week, they will start next week.

## 2021-02-01 NOTE — Progress Notes (Signed)
Location:  [facility] Room Number: 227/A Place of Service:  SNF (31) Provider:  Kenard Gower, DNP, FNP-BC  Patient Care Team: [name] as PCP - General (Physician Assistant)  Extended Emergency Contact Information Primary Emergency Contact: [name] Mobile Phone: [phone] Relation: Son Secondary Emergency Contact: [name] Mobile Phone: [phone] Relation: Daughter  Code Status:  Full Code  Goals of care: Advanced Directive information Advanced Directives 02/01/2021  Does Patient Have a Medical Advance Directive? No  Would patient like information on creating a medical advance directive? No - Patient declined     Chief Complaint  Patient presents with  . Discharge Note    Discharge Visit     HPI:  Pt is a 65 y.o. female who is for discharge home on 02/02/21 with Home health PT and OT.  She was admitted to [facility] on 01/10/21 post hospitalization 01/05/21 to 01/10/21 for COVID-19 infection. She had 2 COVID-19 vaccination but no booster. Chest x-ray was negative for pneumonia. She got 5 days of Remdesevir and Decadron  At [facility], her O2 sats drop to 84% on room air after an activity so he is needing O2 @ 2L/min via [place] continuously.  Patient was admitted to this facility for short-term rehabilitation after the patient's recent hospitalization.  Patient has completed SNF rehabilitation and therapy has cleared the patient for discharge.    Past Medical History:  Diagnosis Date  . Arthritis   . Dyspnea    W/ EXERTION, +  HUMIDITY   . Emphysema lung (HCC)   . GERD (gastroesophageal reflux disease)   . Hashimoto's thyroiditis   . Heart murmur   . 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 HTN (hypertension)   . Hypercholesteremia   . Hypothyroid   . Rheumatoid arthritis(714.0)   . Varicose vein of leg    Past Surgical History:  Procedure Laterality Date  . APPENDECTOMY    . CHOLECYSTECTOMY     . ELBOW SURGERY     LEFT  . ENDOSCOPIC VEIN LASER TREATMENT     RIGHT LEG  . EXPLORATORY LAPAROTOMY    . HAND SURGERY     RIGHT   . HEEL SPUR SURGERY     BILAT  . INSERTION OF MESH N/A 08/04/2017   Procedure: INSERTION OF MESH;  Surgeon: Marilyn Level, MD;  Location: [facility] OR;  Service: General;  Laterality: N/A;  . partial thyroidectomy  2015  . TONSILLECTOMY    . TUBAL LIGATION    . VENTRAL HERNIA REPAIR N/A 08/04/2017   Procedure: LAPAROSCOPIC VENTRAL INCISIONAL HERNIA REPAIR WITH MESH;  Surgeon: Marilyn Level, MD;  Location: [facility] OR;  Service: General;  Laterality: N/A;    Allergies  Allergen Reactions  . Oxycodone-Acetaminophen Nausea Only and Other (See Comments)    Violent Vomiting Violent Vomiting  . Penicillins Anaphylaxis, Swelling and Rash    Has patient had a PCN reaction causing immediate rash, facial/tongue/throat swelling, SOB or lightheadedness with hypotension: Yes Has patient had a PCN reaction causing severe rash involving mucus membranes or skin necrosis: No Has patient had a PCN reaction that required hospitalization No Has patient had a PCN reaction occurring within the last 10 years: No If all of the above answers are "NO", then may proceed with Cephalosporin use.   . Latex Hives and Itching    Blisters (also)  . Oxycodone-Acetaminophen Nausea And Vomiting  . Tyloxapol Nausea And Vomiting  . Adhesive [Tape] Rash    Patient prefers  paper tape  . Strawberry Extract Hives, Itching and Rash    Outpatient Encounter Medications as of 02/01/2021  Medication Sig  . Ascorbic Acid (VITAMIN C) 1000 MG tablet Take 1 tablet (1,000 mg total) by mouth daily.  . cholecalciferol (VITAMIN D) 1000 units tablet take 1 tablet by mouth once daily  . leflunomide (ARAVA) 10 MG tablet Take 1 tablet (10 mg total) by mouth See admin instructions. Monday-friday  . levothyroxine (SYNTHROID) 150 MCG tablet Take 1 tablet (150 mcg total) by mouth daily before breakfast.  . Multiple  Vitamins-Minerals (ONE-A-DAY WOMENS 50+ ADVANTAGE) TABS Take 1 tablet by mouth daily.  . ondansetron (ZOFRAN ODT) 4 MG disintegrating tablet Take 1 tablet (4 mg total) by mouth every 8 (eight) hours as needed.  . pantoprazole (PROTONIX) 40 MG tablet TAKE TWO TABLETS BY MOUTH AT BEDTIME  . polyethylene glycol (MIRALAX / GLYCOLAX) 17 g packet Take 17 g by mouth daily as needed for mild constipation.  . predniSONE (DELTASONE) 5 MG tablet Take 1 tablet (5 mg total) by mouth daily.  Marland Kitchen senna-docusate (SENOKOT-S) 8.6-50 MG tablet Take 1 tablet by mouth at bedtime as needed for mild constipation or moderate constipation.  . simvastatin (ZOCOR) 20 MG tablet Take 1 tablet (20 mg total) by mouth daily at 6 PM.  . traMADol (ULTRAM) 50 MG tablet TAKE 1 TABLET ( ) BY MOUTH 3 TIMES DAILY AS NEEDED FOR MODERATE OR SEVERE PAIN  . XARELTO 20 MG TABS tablet Take 1 tablet (20 mg total) by mouth daily.   No facility-administered encounter medications on file as of 02/01/2021.    Review of Systems  GENERAL: No change in appetite, no fatigue, no weight changes, no fever, chills or weakness MOUTH and THROAT: Denies oral discomfort, gingival pain or bleeding  RESPIRATORY: no cough, SOB, DOE, wheezing, hemoptysis CARDIAC: No chest pain, edema or palpitations GI: No abdominal pain, diarrhea, constipation, heart burn, nausea or vomiting GU: Denies dysuria, frequency, hematuria, incontinence, or discharge NEUROLOGICAL: Denies dizziness, syncope, numbness, or headache PSYCHIATRIC: Denies feelings of depression or anxiety. No report of hallucinations, insomnia, paranoia, or agitation    Immunization History  Administered Date(s) Administered  . Influenza Whole 09/06/2008  . Influenza,inj,Quad PF,6+ Mos 09/02/2018  . Influenza,inj,quad, With Preservative 10/13/2019  . PFIZER(Purple Top)SARS-COV-2 Vaccination 04/14/2020, 05/12/2020  . Pneumococcal Conjugate-13 05/05/2017   Pertinent  Health Maintenance Due   Topic Date Due  . PAP SMEAR-Modifier  Never done  . COLONOSCOPY (Pts 45-19yrs Insurance coverage will need to be confirmed)  Never done  . MAMMOGRAM  Never done  . INFLUENZA VACCINE  07/16/2020   Fall Risk  10/28/2018 09/02/2018 08/04/2018 06/03/2018 04/21/2018  Falls in the past year? 1 No No Yes Yes  Number falls in past yr: 0 - - 2 or more 2 or more  Comment - - - - -  Injury with Fall? 0 - - Yes Yes  Comment - - - - bruising  Risk Factor Category  - - - High Fall Risk High Fall Risk  Comment - - - - -  Risk for fall due to : - - - Impaired balance/gait;History of fall(s) -  Risk for fall due to: Comment - - - - -  Follow up - - - - -     Vitals:   02/01/21 1233  BP: 119/85  Pulse: 95  Resp: 17  Temp: 98.3 F (36.8 C)  SpO2: 97%  Weight: 216 lb (98 kg)  Height:  (1.575 m)  Body mass index is 39.51 kg/m.  Physical Exam  GENERAL APPEARANCE: Well nourished. In no acute distress. Morbidly obese. SKIN:  Skin is warm and dry.  MOUTH and THROAT: Lips are without lesions. Oral mucosa is moist and without lesions. Tongue is normal in shape, size, and color and without lesions RESPIRATORY: Breathing is even & unlabored, BS CTAB CARDIAC: RRR, no murmur,no extra heart sounds, no edema GI: Abdomen soft, normal BS, no masses, no tenderness EXTREMITIES:  Able to move X 4 extremities NEUROLOGICAL: There is no tremor. Speech is clear. Alert and oriented X 3. PSYCHIATRIC:  Affect and behavior are appropriate  Labs reviewed: Recent Labs    01/07/21 0332 01/08/21 0421 01/09/21 0500 01/18/21 0000 01/25/21 0000  NA 141 137 136 139 136*  K 4.0 4.2 4.4 4.5 4.5  CL 112* 109 107 103 100  CO2 22 21* 21* 26* 24*  GLUCOSE 147* 138* 162*  --   --   BUN 28* 35* 38* 31* 31*  CREATININE 1.36* 1.48* 1.47* 1.3* 1.1  CALCIUM 7.8* 8.3* 8.3* 8.9 8.8   Recent Labs    01/07/21 0332 01/08/21 0421 01/09/21 0500  AST 21 27 65*  ALT 15 20 43  ALKPHOS 71 66 67  BILITOT 0.7 0.5 0.6   PROT 5.2* 5.1* 5.0*  ALBUMIN 2.1* 2.1* 2.1*   Recent Labs    01/05/21 1427 01/06/21 0237 01/07/21 0332 01/08/21 0421 01/09/21 0500 01/18/21 0000 01/25/21 0000  WBC 5.9   < > 3.6* 5.9 4.1 6.4 5.1  NEUTROABS 4.4  --   --   --   --  4.80  --   HGB 12.3   < > 10.6* 10.8* 10.7* 11.0* 9.9*  HCT 39.9   < > 34.2* 34.7* 33.8* 33* 30*  MCV 97.1   < > 97.2 96.4 96.3  --   --   PLT 142*   < > 132* 168 168 133* 97*   < > = values in this interval not displayed.   Lab Results  Component Value Date   TSH 53.317 (H) 01/05/2021   Lab Results  Component Value Date   HGBA1C 5.2 11/14/2016   Lab Results  Component Value Date   CHOL 224 (H) 11/15/2016   HDL 51 11/15/2016   LDLCALC 142 (H) 11/15/2016   TRIG 157 (H) 11/15/2016   CHOLHDL 4.4 11/15/2016    Significant Diagnostic Results in last 30 days:  DG Chest Portable 1 View  Result Date: 01/05/2021 CLINICAL DATA:  Cough EXAM: PORTABLE CHEST 1 VIEW COMPARISON:  01/11/2020. FINDINGS: Mild enlargement the cardiac silhouette. No consolidation. Scattered calcified granulomas, better characterized on recent CT chest. No visible pleural effusions or pneumothorax. No acute osseous abnormality. Thoracic inlet clips. IMPRESSION: 1. No evidence of acute cardiopulmonary disease. 2. Chronic cardiomegaly and central pulmonary vascular congestion. Electronically Signed   By: Feliberto Harts MD   On: 01/05/2021 14:32    Assessment/Plan  1. COVID-19 -   S/P 5 days of remdesivir and Decadron -   Continue vitamin C 1000 mg 1 tab daily  2. Pulmonary emphysema, unspecified emphysema type (HCC) -   Continue O2 at 2 L/minute via [place]  3. Chronic diastolic CHF (congestive heart failure) (HCC) -  01/25/21 creatinine 1.08, GFR 54.65, improved -  Will start Lasix and KCL, home medications - furosemide (LASIX) 20 MG tablet; Take 1 tablet (20 mg total) by mouth daily.  Dispense: 30 tablet; Refill: 0 - potassium chloride (KLOR-CON) 10 MEQ tablet; Take  1  tablet (10 mEq total) by mouth daily.  Dispense: 30 tablet; Refill: 0  4. History of pulmonary embolism -Continue Xarelto 20 mg 1 tab daily  5. Hypothyroidism due to Hashimoto's thyroiditis Lab Results  Component Value Date   TSH 53.317 (H) 01/05/2021   -Continue levothyroxine 150 mcg 1 tab daily  6. Rheumatoid arthritis involving multiple sites, unspecified whether rheumatoid factor present (HCC) -   Continue leflunomide 10 mg 1 tab daily on Monday, Tuesday, Wednesday, Thursday and Friday and prednisone 5 mg 1 tab daily and tramadol 50 mg 1 tab TID PRN.  7. Mixed hyperlipidemia -   Continue simvastatin 20 mg 1 tab at 6 PM  8. Gastroesophageal reflux disease without esophagitis -   Continue pantoprazole 40 mg 2 tabs = 80 mg at bedtime  9. Generalized weakness -   For home health PT and OT, for therapeutic strengthening exercises  10.  Acute respiratory failure with hypoxia -   Continue O2 at 2 L/minute via [place] continuously  11.  Chronic kidney disease stage IIIa Lab Results  Component Value Date   NA 136 (A) 01/25/2021   K 4.5 01/25/2021   CO2 24 (A) 01/25/2021   GLUCOSE 162 (H) 01/09/2021   BUN 31 (A) 01/25/2021   CREATININE 1.1 01/25/2021   CALCIUM 8.8 01/25/2021   GFRNONAA 54.65 01/25/2021   GFRAA 63.34 01/25/2021   -  Improved, will re-start Lasix and KCL -  Was given IV fluids in the hospital,    I have filled out patient's discharge paperwork and e-prescribed medications.  Patient will have home health PT and OT.  DME provided: Wheelchair, walker and O2 at 2 L/minute via [place] continuously  Wheelchair  -she suffers from generalized weakness due to COVID-19 infection and has rheumatoid arthritis which impairs her ability to perform daily activities like toileting, feeding, dressing, grooming and bathing in the home. A cane or walker will not resolve issue with performing activities of daily living. A wheelchair will allow patient to safely perform daily activities.  Patient can safely propel the wheelchair in the home.  O2 at 2 L/minute  -  O2 sat on room air 84%                                    O2 sat on O2 at 2 L/minute 95%   Total discharge time: Greater than 30 minutes Greater than 50% was spent in counseling and coordination of care.    Discharge time involved coordination of the discharge process with social worker, nursing staff and therapy department. Medical justification for home health services/DME verified.   Marilyn Gower, DNP, MSN, FNP-BC [facility] [phone] (Monday-Friday 8:00 a.m. - 5:00 p.m.) [phone] (after hours)

## 2021-02-26 ENCOUNTER — Other Ambulatory Visit: Payer: Self-pay | Admitting: Adult Health

## 2021-02-26 DIAGNOSIS — M069 Rheumatoid arthritis, unspecified: Secondary | ICD-10-CM

## 2021-04-20 ENCOUNTER — Other Ambulatory Visit: Payer: Self-pay | Admitting: Adult Health

## 2021-04-20 DIAGNOSIS — M069 Rheumatoid arthritis, unspecified: Secondary | ICD-10-CM

## 2021-05-21 ENCOUNTER — Emergency Department (HOSPITAL_COMMUNITY)
Admission: EM | Admit: 2021-05-21 | Discharge: 2021-05-22 | Disposition: A | Discharge status: Home / Self Care | Payer: BLUE CROSS/BLUE SHIELD | Attending: Emergency Medicine | Admitting: Emergency Medicine

## 2021-05-21 ENCOUNTER — Emergency Department (HOSPITAL_COMMUNITY): Payer: BLUE CROSS/BLUE SHIELD

## 2021-05-21 DIAGNOSIS — E039 Hypothyroidism, unspecified: Secondary | ICD-10-CM | POA: Insufficient documentation

## 2021-05-21 DIAGNOSIS — J449 Chronic obstructive pulmonary disease, unspecified: Secondary | ICD-10-CM | POA: Diagnosis not present

## 2021-05-21 DIAGNOSIS — Z8616 Personal history of COVID-19: Secondary | ICD-10-CM | POA: Insufficient documentation

## 2021-05-21 DIAGNOSIS — Z79899 Other long term (current) drug therapy: Secondary | ICD-10-CM | POA: Diagnosis not present

## 2021-05-21 DIAGNOSIS — I5030 Unspecified diastolic (congestive) heart failure: Secondary | ICD-10-CM | POA: Insufficient documentation

## 2021-05-21 DIAGNOSIS — I110 Hypertensive heart disease with heart failure: Secondary | ICD-10-CM | POA: Diagnosis not present

## 2021-05-21 DIAGNOSIS — K219 Gastro-esophageal reflux disease without esophagitis: Secondary | ICD-10-CM | POA: Diagnosis not present

## 2021-05-21 DIAGNOSIS — E860 Dehydration: Secondary | ICD-10-CM | POA: Diagnosis not present

## 2021-05-21 DIAGNOSIS — Z87891 Personal history of nicotine dependence: Secondary | ICD-10-CM | POA: Insufficient documentation

## 2021-05-21 DIAGNOSIS — Z91040 Latex allergy status: Secondary | ICD-10-CM | POA: Insufficient documentation

## 2021-05-21 DIAGNOSIS — R1084 Generalized abdominal pain: Secondary | ICD-10-CM

## 2021-05-21 DIAGNOSIS — R197 Diarrhea, unspecified: Secondary | ICD-10-CM | POA: Diagnosis not present

## 2021-05-21 LAB — URINALYSIS, ROUTINE W REFLEX MICROSCOPIC
Bilirubin Urine: NEGATIVE
Glucose, UA: NEGATIVE mg/dL
Hgb urine dipstick: NEGATIVE
Ketones, ur: NEGATIVE mg/dL
Nitrite: NEGATIVE
Protein, ur: NEGATIVE mg/dL
Specific Gravity, Urine: 1.01 (ref 1.005–1.030)
pH: 7 (ref 5.0–8.0)

## 2021-05-21 LAB — CBC WITH DIFFERENTIAL/PLATELET
Abs Immature Granulocytes: 0.08 10*3/uL — ABNORMAL HIGH (ref 0.00–0.07)
Basophils Absolute: 0 10*3/uL (ref 0.0–0.1)
Basophils Relative: 1 %
Eosinophils Absolute: 0.1 10*3/uL (ref 0.0–0.5)
Eosinophils Relative: 2 %
HCT: 40.2 % (ref 36.0–46.0)
Hemoglobin: 11.9 g/dL — ABNORMAL LOW (ref 12.0–15.0)
Immature Granulocytes: 1 %
Lymphocytes Relative: 12 %
Lymphs Abs: 0.9 10*3/uL (ref 0.7–4.0)
MCH: 28.2 pg (ref 26.0–34.0)
MCHC: 29.6 g/dL — ABNORMAL LOW (ref 30.0–36.0)
MCV: 95.3 fL (ref 80.0–100.0)
Monocytes Absolute: 0.9 10*3/uL (ref 0.1–1.0)
Monocytes Relative: 12 %
Neutro Abs: 5.6 10*3/uL (ref 1.7–7.7)
Neutrophils Relative %: 72 %
Platelets: 202 10*3/uL (ref 150–400)
RBC: 4.22 MIL/uL (ref 3.87–5.11)
RDW: 15.2 % (ref 11.5–15.5)
WBC: 7.6 10*3/uL (ref 4.0–10.5)
nRBC: 0 % (ref 0.0–0.2)

## 2021-05-21 LAB — COMPREHENSIVE METABOLIC PANEL
ALT: 10 U/L (ref 0–44)
AST: 17 U/L (ref 15–41)
Albumin: 2.9 g/dL — ABNORMAL LOW (ref 3.5–5.0)
Alkaline Phosphatase: 90 U/L (ref 38–126)
Anion gap: 7 (ref 5–15)
BUN: 9 mg/dL (ref 8–23)
CO2: 25 mmol/L (ref 22–32)
Calcium: 9.1 mg/dL (ref 8.9–10.3)
Chloride: 106 mmol/L (ref 98–111)
Creatinine, Ser: 1.08 mg/dL — ABNORMAL HIGH (ref 0.44–1.00)
GFR, Estimated: 58 mL/min — ABNORMAL LOW (ref >60)
Glucose, Bld: 85 mg/dL (ref 70–99)
Potassium: 3.6 mmol/L (ref 3.5–5.1)
Sodium: 138 mmol/L (ref 135–145)
Total Bilirubin: 0.7 mg/dL (ref 0.3–1.2)
Total Protein: 6.1 g/dL — ABNORMAL LOW (ref 6.5–8.1)

## 2021-05-21 LAB — URINALYSIS, MICROSCOPIC (REFLEX)

## 2021-05-21 LAB — TSH: TSH: 1.903 u[IU]/mL (ref 0.350–4.500)

## 2021-05-21 LAB — LIPASE, BLOOD: Lipase: 23 U/L (ref 11–51)

## 2021-05-21 MED ORDER — MORPHINE SULFATE (PF) 4 MG/ML IV SOLN
4.0000 mg | Freq: Once | INTRAVENOUS | Status: AC
  Administered 2021-05-21: 4 mg via INTRAVENOUS
  Filled 2021-05-21: qty 1

## 2021-05-21 MED ORDER — SODIUM CHLORIDE 0.9 % IV BOLUS
1000.0000 mL | Freq: Once | INTRAVENOUS | Status: AC
  Administered 2021-05-21: 1000 mL via INTRAVENOUS

## 2021-05-21 MED ORDER — MORPHINE SULFATE (PF) 4 MG/ML IV SOLN
4.0000 mg | Freq: Once | INTRAVENOUS | Status: AC
  Administered 2021-05-22: 4 mg via INTRAVENOUS
  Filled 2021-05-21: qty 1

## 2021-05-21 MED ORDER — GERHARDT'S BUTT CREAM
TOPICAL_CREAM | Freq: Once | CUTANEOUS | Status: AC
  Filled 2021-05-21: qty 1

## 2021-05-21 MED ORDER — ONDANSETRON HCL 4 MG/2ML IJ SOLN
4.0000 mg | Freq: Once | INTRAMUSCULAR | Status: AC
  Administered 2021-05-21: 4 mg via INTRAVENOUS
  Filled 2021-05-21: qty 2

## 2021-05-21 NOTE — ED Notes (Signed)
Patient bottom, labia and groin area have skin moisture damage. Area is red, tender to touch and macerated. Patient was cleaned and all linens were changed. Personal gown and underwear were disposed of at patients request.

## 2021-05-21 NOTE — ED Notes (Signed)
Patient received bolus of NS with EMS.

## 2021-05-21 NOTE — ED Notes (Signed)
Patient has not passed any stool.

## 2021-05-21 NOTE — ED Triage Notes (Signed)
Patient reports diarrhea since Saturday. Weak and tired.

## 2021-05-21 NOTE — ED Provider Notes (Signed)
[facility]   CSN: [id number] Arrival date & time: 05/21/21  2036     History Chief Complaint  Patient presents with  . Diarrhea  . Weakness    [name] is a 64 y.o. female.  Pt presents to the ED today with diarrhea and weakness.  Pt said she developed diarrhea on Saturday, 6/4.  Pt said it was uncontrollable.  She has not been able to eat or drink anything without severe diarrhea.  She feels very weak.  She denies fever.  No vomiting, but she has had nausea.  She did have Covid in Jan and has been on oxygen 2L prn at home.        Past Medical History:  Diagnosis Date  . Arthritis   . Dyspnea    W/ EXERTION, +  HUMIDITY   . Emphysema lung (HCC)   . GERD (gastroesophageal reflux disease)   . Hashimoto's thyroiditis   . Heart murmur   . 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 HTN (hypertension)   . Hypercholesteremia   . Hypothyroid   . Rheumatoid arthritis(714.0)   . Varicose vein of leg     Patient Active Problem List   Diagnosis Date Noted  . COVID-19 01/05/2021  . Hypoxia   . Chronic obstructive pulmonary disease (HCC)   . Mitral valve annular calcification 04/07/2018  . Depression 04/07/2018  . Diastolic heart failure (HCC) 02/10/2018  . Family history of hemophilia B 12/02/2017  . Healthcare maintenance 05/07/2017  . Onychomycosis 05/07/2017  . Tricompartment osteoarthritis of both knees 02/19/2017  . Hyperlipidemia 11/14/2016  . AKI (acute kidney injury) (HCC) 04/23/2015  . Rheumatoid arthritis (HCC) 07/08/2008  . OBESITY 05/04/2008  . Essential hypertension 05/04/2008  . ALLERGIC RHINITIS 05/04/2008  . GERD 05/04/2008  . VARICOSE VEINS LOWER EXTREMITIES W/INFLAMMATION 11/02/2007  . Hypothyroidism 08/18/2007    Past Surgical History:  Procedure Laterality Date  . APPENDECTOMY    . CHOLECYSTECTOMY    . ELBOW SURGERY     LEFT  . ENDOSCOPIC VEIN LASER TREATMENT     RIGHT LEG  . EXPLORATORY LAPAROTOMY     . HAND SURGERY     RIGHT   . HEEL SPUR SURGERY     BILAT  . INSERTION OF MESH N/A 08/04/2017   Procedure: INSERTION OF MESH;  Surgeon: Darnell Level, MD;  Location: [facility] OR;  Service: General;  Laterality: N/A;  . partial thyroidectomy  2015  . TONSILLECTOMY    . TUBAL LIGATION    . VENTRAL HERNIA REPAIR N/A 08/04/2017   Procedure: LAPAROSCOPIC VENTRAL INCISIONAL HERNIA REPAIR WITH MESH;  Surgeon: Darnell Level, MD;  Location: [facility] OR;  Service: General;  Laterality: N/A;     OB History   No obstetric history on file.     Family History  Problem Relation Age of Onset  . Diabetes Mother   . Kidney disease Mother   . Lung cancer Father   . Alzheimer's disease Maternal Grandmother   . Stroke Maternal Grandfather   . Heart disease Paternal Grandmother   . Unexplained death Paternal Grandfather     Social History   Tobacco Use  . Smoking status: Former Smoker    Packs/day: 1.00    Years: 40.00    Pack years: 40.00    Types: Cigarettes    Quit date: 2021    Years since quitting: 1.4  . Smokeless tobacco: Never Used  . Tobacco comment: < 1/2 pack/day  Vaping Use  .  Vaping Use: Never used  Substance Use Topics  . Alcohol use: No  . Drug use: No    Home Medications Prior to Admission medications   Medication Sig Start Date End Date Taking? Authorizing Provider  Ascorbic Acid (VITAMIN C) 1000 MG tablet Take 1 tablet (1,000 mg total) by mouth daily. 01/31/21   Medina-Vargas, Monina C, NP  cholecalciferol (VITAMIN D) 1000 units tablet take 1 tablet by mouth once daily 12/15/17   [provider]  furosemide (LASIX) 20 MG tablet Take 1 tablet (20 mg total) by mouth daily. 02/01/21   Medina-Vargas, Monina C, NP  leflunomide (ARAVA) 10 MG tablet Take 1 tablet (10 mg total) by mouth See admin instructions. Monday-friday 01/31/21   Medina-Vargas, Monina C, NP  levothyroxine (SYNTHROID) 150 MCG tablet Take 1 tablet (150 mcg total) by mouth daily before breakfast. 01/31/21    Medina-Vargas, Monina C, NP  Multiple Vitamins-Minerals (ONE-A-DAY WOMENS 50+ ADVANTAGE) TABS Take 1 tablet by mouth daily.    [provider]  ondansetron (ZOFRAN ODT) 4 MG disintegrating tablet Take 1 tablet (4 mg total) by mouth every 8 (eight) hours as needed. 01/11/20   Long, Arlyss Repress, MD  pantoprazole (PROTONIX) 40 MG tablet TAKE TWO TABLETS BY MOUTH AT BEDTIME 01/31/21   Medina-Vargas, Monina C, NP  polyethylene glycol (MIRALAX / GLYCOLAX) 17 g packet Take 17 g by mouth daily as needed for mild constipation. 01/10/21   Jackelyn Poling, DO  potassium chloride (KLOR-CON) 10 MEQ tablet Take 1 tablet (10 mEq total) by mouth daily. 02/01/21   Medina-Vargas, Monina C, NP  predniSONE (DELTASONE) 5 MG tablet Take 1 tablet (5 mg total) by mouth daily. 01/31/21   Medina-Vargas, Monina C, NP  senna-docusate (SENOKOT-S) 8.6-50 MG tablet Take 1 tablet by mouth at bedtime as needed for mild constipation or moderate constipation. 01/11/20   Long, Arlyss Repress, MD  simvastatin (ZOCOR) 20 MG tablet Take 1 tablet (20 mg total) by mouth daily at 6 PM. 01/31/21   Medina-Vargas, Monina C, NP  traMADol (ULTRAM) 50 MG tablet TAKE 1 TABLET (50MG ) BY MOUTH 3 TIMES DAILY AS NEEDED FOR MODERATE OR SEVERE PAIN 01/31/21   Medina-Vargas, Monina C, NP  XARELTO 20 MG TABS tablet Take 1 tablet (20 mg total) by mouth daily. 01/31/21   Medina-Vargas, Monina C, NP    Allergies    Oxycodone-acetaminophen, Penicillins, Latex, Oxycodone-acetaminophen, Tyloxapol, Adhesive [tape], and Strawberry extract  Review of Systems   Review of Systems  Gastrointestinal: Positive for abdominal pain, diarrhea and nausea.  Neurological: Positive for weakness.  All other systems reviewed and are negative.   Physical Exam Updated Vital Signs BP 107/68   Pulse 91   Temp 100 F (37.8 C) (Oral)   Resp (!) 34   Ht 5\' 3"  (1.6 m)   Wt 90.7 kg   SpO2 98%   BMI 35.43 kg/m   Physical Exam Vitals and nursing note reviewed.  Constitutional:       Appearance: She is obese.  HENT:     Head: Normocephalic and atraumatic.     Right Ear: External ear normal.     Left Ear: External ear normal.     Nose: Nose normal.     Mouth/Throat:     Mouth: Mucous membranes are dry.  Eyes:     Extraocular Movements: Extraocular movements intact.     Conjunctiva/sclera: Conjunctivae normal.     Pupils: Pupils are equal, round, and reactive to light.  Cardiovascular:  Rate and Rhythm: Normal rate and regular rhythm.     Pulses: Normal pulses.     Heart sounds: Normal heart sounds.  Pulmonary:     Effort: Pulmonary effort is normal.     Breath sounds: Normal breath sounds.  Abdominal:     General: Abdomen is flat. Bowel sounds are normal.     Palpations: Abdomen is soft.     Tenderness: There is generalized abdominal tenderness.  Musculoskeletal:        General: Normal range of motion.     Cervical back: Normal range of motion and neck supple.  Skin:    General: Skin is warm.     Capillary Refill: Capillary refill takes less than 2 seconds.  Neurological:     General: No focal deficit present.     Mental Status: She is alert and oriented to person, place, and time.  Psychiatric:        Mood and Affect: Mood normal.        Behavior: Behavior normal.        Thought Content: Thought content normal.        Judgment: Judgment normal.     ED Results / Procedures / Treatments   Labs (all labs ordered are listed, but only abnormal results are displayed) Labs Reviewed  CBC WITH DIFFERENTIAL/PLATELET - Abnormal; Notable for the following components:      Result Value   Hemoglobin 11.9 (*)    MCHC 29.6 (*)    Abs Immature Granulocytes 0.08 (*)    All other components within normal limits  COMPREHENSIVE METABOLIC PANEL - Abnormal; Notable for the following components:   Creatinine, Ser 1.08 (*)    Total Protein 6.1 (*)    Albumin 2.9 (*)    GFR, Estimated 58 (*)    All other components within normal limits  URINALYSIS, ROUTINE W  REFLEX MICROSCOPIC - Abnormal; Notable for the following components:   Leukocytes,Ua TRACE (*)    All other components within normal limits  URINALYSIS, MICROSCOPIC (REFLEX) - Abnormal; Notable for the following components:   Bacteria, UA RARE (*)    All other components within normal limits  GASTROINTESTINAL PANEL BY PCR, STOOL (REPLACES STOOL CULTURE)  C DIFFICILE QUICK SCREEN W PCR REFLEX  LIPASE, BLOOD  TSH    EKG None  Radiology DG Chest Portable 1 View  Result Date: 05/21/2021 CLINICAL DATA:  Diarrhea and weakness. EXAM: PORTABLE CHEST 1 VIEW COMPARISON:  January 05, 2021 FINDINGS: Chronic appearing increased lung markings are seen. Mild areas of atelectasis and/or infiltrate are noted within the bilateral lung bases. A stable calcified granuloma is seen along the lateral aspect of the mid to upper right lung. There is no evidence of a pleural effusion or pneumothorax. The heart size and mediastinal contours are within normal limits. Surgical clips are seen along the thoracic inlet. The visualized skeletal structures are unremarkable. IMPRESSION: Mild bibasilar atelectasis and/or infiltrate. Electronically Signed   By: Aram Candela M.D.   On: 05/21/2021 21:13    Procedures Procedures   Medications Ordered in ED Medications  morphine 4 MG/ML injection 4 mg (has no administration in time range)  Gerhardt's butt cream (has no administration in time range)  morphine 4 MG/ML injection 4 mg (4 mg Intravenous Given 05/21/21 2147)  ondansetron (ZOFRAN) injection 4 mg (4 mg Intravenous Given 05/21/21 2147)  sodium chloride 0.9 % bolus 1,000 mL (0 mLs Intravenous Stopped 05/21/21 2308)    ED Course  I have reviewed the  triage vital signs and the nursing notes.  Pertinent labs & imaging results that were available during my care of the patient were reviewed by me and considered in my medical decision making (see chart for details).    MDM Rules/Calculators/A&P                           Pt's bottom red, so butt cream was ordered.  Pt still with abdominal pain, so a CT abd/pelvis was added on.  Pt signed out to Dr. Eudelia Bunch at shift change. Final Clinical Impression(s) / ED Diagnoses Final diagnoses:  Dehydration  Diarrhea, unspecified type  Generalized abdominal pain    Rx / DC Orders ED Discharge Orders    None       Jacalyn Lefevre, MD 05/21/21 2348

## 2021-05-22 ENCOUNTER — Emergency Department (HOSPITAL_COMMUNITY): Payer: BLUE CROSS/BLUE SHIELD

## 2021-05-22 MED ORDER — SODIUM CHLORIDE 0.9 % IV BOLUS
1000.0000 mL | Freq: Once | INTRAVENOUS | Status: AC
  Administered 2021-05-22: 1000 mL via INTRAVENOUS

## 2021-05-22 NOTE — ED Notes (Signed)
Patient transported to CT 

## 2021-05-22 NOTE — ED Provider Notes (Signed)
I assumed care of this patient.  Please see previous provider note for further details of Hx, PE.  Briefly patient is a 65 y.o. female who presented here for diarrhea and general fatigue. Labs reassuring. Pending CT.  CT reassuring. Tolerating oral intake.  The patient appears reasonably screened and/or stabilized for discharge and I doubt any other medical condition or other [facility] requiring further screening, evaluation, or treatment in the ED at this time prior to discharge. Safe for discharge with strict return precautions.  Disposition: Discharge  Condition: Good  I have discussed the results, Dx and Tx plan with the patient/family who expressed understanding and agree(s) with the plan. Discharge instructions discussed at length. The patient/family was given strict return precautions who verbalized understanding of the instructions. No further questions at time of discharge.    ED Discharge Orders    None       Follow Up: Chyrel Masson [address] [place] [address] [phone]  Call  to schedule an appointment for close follow up          Lameisha Schuenemann, Amadeo Garnet, MD 05/22/21 [phone]

## 2021-05-22 NOTE — ED Notes (Signed)
Patient is back from CT.

## 2021-05-22 NOTE — ED Notes (Signed)
Patient given a ham sandwich, apple juice, and water as a PO challenge.

## 2021-05-22 NOTE — ED Notes (Signed)
Cream applied to patients buttock. Patient is requesting water or ice. Will ask the Dr.

## 2021-05-22 NOTE — ED Notes (Signed)
Brought pt paper scrubs & socks and helped her get changed. Gave pt dc papers, advised pt to drink lots of fluids, use barrier cream that she was sent home with and practice good hygiene. Brought pt cup of ice water, put personal belongings in a pt belonging bag, pt has cellphone, brought pt wheelchair, pt waiting for son in [place]. Pt A&O x4

## 2021-05-22 NOTE — ED Notes (Signed)
Called son, Thayer Ohm, updated that pt is discharged, he says he will come pick pt up

## 2021-05-22 NOTE — ED Notes (Signed)
Purewick was changed.

## 2022-05-29 DIAGNOSIS — G4733 Obstructive sleep apnea (adult) (pediatric): Secondary | ICD-10-CM

## 2023-05-28 DIAGNOSIS — E119 Type 2 diabetes mellitus without complications: Secondary | ICD-10-CM

## 2024-05-06 DIAGNOSIS — E039 Hypothyroidism, unspecified: Secondary | ICD-10-CM | POA: Diagnosis not present

## 2024-05-06 DIAGNOSIS — I4891 Unspecified atrial fibrillation: Secondary | ICD-10-CM | POA: Diagnosis not present

## 2024-05-06 DIAGNOSIS — E119 Type 2 diabetes mellitus without complications: Secondary | ICD-10-CM | POA: Diagnosis not present

## 2024-05-07 DIAGNOSIS — E119 Type 2 diabetes mellitus without complications: Secondary | ICD-10-CM | POA: Diagnosis not present

## 2024-05-07 DIAGNOSIS — I4891 Unspecified atrial fibrillation: Secondary | ICD-10-CM | POA: Diagnosis not present

## 2024-05-07 DIAGNOSIS — E039 Hypothyroidism, unspecified: Secondary | ICD-10-CM | POA: Diagnosis not present

## 2024-05-20 ENCOUNTER — Emergency Department

## 2024-05-20 ENCOUNTER — Inpatient Hospital Stay
Admission: EM | Admit: 2024-05-20 | Discharge: 2024-05-24 | DRG: 291 | Disposition: A | Discharge status: Skilled Nursing Facility | Attending: Obstetrics and Gynecology | Admitting: Obstetrics and Gynecology

## 2024-05-20 ENCOUNTER — Other Ambulatory Visit: Payer: Self-pay

## 2024-05-20 DIAGNOSIS — I5033 Acute on chronic diastolic (congestive) heart failure: Secondary | ICD-10-CM

## 2024-05-20 DIAGNOSIS — I4891 Unspecified atrial fibrillation: Secondary | ICD-10-CM

## 2024-05-20 DIAGNOSIS — D67 Hereditary factor IX deficiency: Secondary | ICD-10-CM | POA: Diagnosis present

## 2024-05-20 DIAGNOSIS — E890 Postprocedural hypothyroidism: Secondary | ICD-10-CM | POA: Diagnosis present

## 2024-05-20 DIAGNOSIS — I480 Paroxysmal atrial fibrillation: Secondary | ICD-10-CM

## 2024-05-20 DIAGNOSIS — J439 Emphysema, unspecified: Secondary | ICD-10-CM | POA: Diagnosis present

## 2024-05-20 DIAGNOSIS — I110 Hypertensive heart disease with heart failure: Principal | ICD-10-CM | POA: Diagnosis present

## 2024-05-20 DIAGNOSIS — R040 Epistaxis: Secondary | ICD-10-CM | POA: Diagnosis not present

## 2024-05-20 DIAGNOSIS — Z8249 Family history of ischemic heart disease and other diseases of the circulatory system: Secondary | ICD-10-CM

## 2024-05-20 DIAGNOSIS — J449 Chronic obstructive pulmonary disease, unspecified: Secondary | ICD-10-CM | POA: Diagnosis present

## 2024-05-20 DIAGNOSIS — Z86711 Personal history of pulmonary embolism: Secondary | ICD-10-CM

## 2024-05-20 DIAGNOSIS — Z841 Family history of disorders of kidney and ureter: Secondary | ICD-10-CM

## 2024-05-20 DIAGNOSIS — Z79890 Hormone replacement therapy (postmenopausal): Secondary | ICD-10-CM

## 2024-05-20 DIAGNOSIS — Z833 Family history of diabetes mellitus: Secondary | ICD-10-CM

## 2024-05-20 DIAGNOSIS — Z7951 Long term (current) use of inhaled steroids: Secondary | ICD-10-CM

## 2024-05-20 DIAGNOSIS — I509 Heart failure, unspecified: Secondary | ICD-10-CM

## 2024-05-20 DIAGNOSIS — Z86718 Personal history of other venous thrombosis and embolism: Secondary | ICD-10-CM

## 2024-05-20 DIAGNOSIS — G4733 Obstructive sleep apnea (adult) (pediatric): Secondary | ICD-10-CM

## 2024-05-20 DIAGNOSIS — Z91040 Latex allergy status: Secondary | ICD-10-CM

## 2024-05-20 DIAGNOSIS — Z820 Family history of epilepsy and other diseases of the nervous system: Secondary | ICD-10-CM

## 2024-05-20 DIAGNOSIS — Z1152 Encounter for screening for COVID-19: Secondary | ICD-10-CM

## 2024-05-20 DIAGNOSIS — Z7969 Long term (current) use of other immunomodulators and immunosuppressants: Secondary | ICD-10-CM

## 2024-05-20 DIAGNOSIS — M069 Rheumatoid arthritis, unspecified: Secondary | ICD-10-CM | POA: Diagnosis present

## 2024-05-20 DIAGNOSIS — Z801 Family history of malignant neoplasm of trachea, bronchus and lung: Secondary | ICD-10-CM

## 2024-05-20 DIAGNOSIS — G8929 Other chronic pain: Secondary | ICD-10-CM | POA: Diagnosis present

## 2024-05-20 DIAGNOSIS — E039 Hypothyroidism, unspecified: Secondary | ICD-10-CM | POA: Diagnosis present

## 2024-05-20 DIAGNOSIS — E119 Type 2 diabetes mellitus without complications: Secondary | ICD-10-CM

## 2024-05-20 DIAGNOSIS — Z91048 Other nonmedicinal substance allergy status: Secondary | ICD-10-CM

## 2024-05-20 DIAGNOSIS — Z7901 Long term (current) use of anticoagulants: Secondary | ICD-10-CM

## 2024-05-20 DIAGNOSIS — J9621 Acute and chronic respiratory failure with hypoxia: Secondary | ICD-10-CM

## 2024-05-20 DIAGNOSIS — E1140 Type 2 diabetes mellitus with diabetic neuropathy, unspecified: Secondary | ICD-10-CM | POA: Diagnosis present

## 2024-05-20 DIAGNOSIS — Z885 Allergy status to narcotic agent status: Secondary | ICD-10-CM

## 2024-05-20 DIAGNOSIS — J9622 Acute and chronic respiratory failure with hypercapnia: Secondary | ICD-10-CM | POA: Diagnosis present

## 2024-05-20 DIAGNOSIS — I4892 Unspecified atrial flutter: Secondary | ICD-10-CM | POA: Diagnosis present

## 2024-05-20 DIAGNOSIS — E063 Autoimmune thyroiditis: Secondary | ICD-10-CM | POA: Diagnosis present

## 2024-05-20 DIAGNOSIS — I10 Essential (primary) hypertension: Secondary | ICD-10-CM | POA: Diagnosis present

## 2024-05-20 DIAGNOSIS — Z888 Allergy status to other drugs, medicaments and biological substances status: Secondary | ICD-10-CM

## 2024-05-20 DIAGNOSIS — Z148 Genetic carrier of other disease: Secondary | ICD-10-CM

## 2024-05-20 DIAGNOSIS — Z87891 Personal history of nicotine dependence: Secondary | ICD-10-CM

## 2024-05-20 DIAGNOSIS — Z7984 Long term (current) use of oral hypoglycemic drugs: Secondary | ICD-10-CM

## 2024-05-20 DIAGNOSIS — E7800 Pure hypercholesterolemia, unspecified: Secondary | ICD-10-CM | POA: Diagnosis present

## 2024-05-20 DIAGNOSIS — J9601 Acute respiratory failure with hypoxia: Secondary | ICD-10-CM

## 2024-05-20 DIAGNOSIS — Z823 Family history of stroke: Secondary | ICD-10-CM

## 2024-05-20 DIAGNOSIS — Z6841 Body Mass Index (BMI) 40.0 and over, adult: Secondary | ICD-10-CM

## 2024-05-20 DIAGNOSIS — Z880 Allergy status to penicillin: Secondary | ICD-10-CM

## 2024-05-20 DIAGNOSIS — E66813 Obesity, class 3: Secondary | ICD-10-CM | POA: Insufficient documentation

## 2024-05-20 LAB — COMPREHENSIVE METABOLIC PANEL WITH GFR
ALT: 14 U/L (ref 0–44)
AST: 12 U/L — ABNORMAL LOW (ref 15–41)
Albumin: 2.8 g/dL — ABNORMAL LOW (ref 3.5–5.0)
Alkaline Phosphatase: 55 U/L (ref 38–126)
Anion gap: 6 (ref 5–15)
BUN: 21 mg/dL (ref 8–23)
CO2: 28 mmol/L (ref 22–32)
Calcium: 8.3 mg/dL — ABNORMAL LOW (ref 8.9–10.3)
Chloride: 105 mmol/L (ref 98–111)
Creatinine, Ser: 0.75 mg/dL (ref 0.44–1.00)
GFR, Estimated: >60 mL/min (ref >60)
Glucose, Bld: 112 mg/dL — ABNORMAL HIGH (ref 70–99)
Potassium: 4.6 mmol/L (ref 3.5–5.1)
Sodium: 139 mmol/L (ref 135–145)
Total Bilirubin: 0.6 mg/dL (ref 0.0–1.2)
Total Protein: 6 g/dL — ABNORMAL LOW (ref 6.5–8.1)

## 2024-05-20 LAB — CBC WITH DIFFERENTIAL/PLATELET
Abs Immature Granulocytes: 0.2 10*3/uL — ABNORMAL HIGH (ref 0.00–0.07)
Basophils Absolute: 0 10*3/uL (ref 0.0–0.1)
Basophils Relative: 0 %
Eosinophils Absolute: 0.1 10*3/uL (ref 0.0–0.5)
Eosinophils Relative: 1 %
HCT: 30.1 % — ABNORMAL LOW (ref 36.0–46.0)
Hemoglobin: 9.1 g/dL — ABNORMAL LOW (ref 12.0–15.0)
Immature Granulocytes: 2 %
Lymphocytes Relative: 6 %
Lymphs Abs: 0.6 10*3/uL — ABNORMAL LOW (ref 0.7–4.0)
MCH: 30.2 pg (ref 26.0–34.0)
MCHC: 30.2 g/dL (ref 30.0–36.0)
MCV: 100 fL (ref 80.0–100.0)
Monocytes Absolute: 0.6 10*3/uL (ref 0.1–1.0)
Monocytes Relative: 6 %
Neutro Abs: 7.9 10*3/uL — ABNORMAL HIGH (ref 1.7–7.7)
Neutrophils Relative %: 85 %
Platelets: 108 10*3/uL — ABNORMAL LOW (ref 150–400)
RBC: 3.01 MIL/uL — ABNORMAL LOW (ref 3.87–5.11)
RDW: 15.6 % — ABNORMAL HIGH (ref 11.5–15.5)
WBC: 9.2 10*3/uL (ref 4.0–10.5)
nRBC: 0 % (ref 0.0–0.2)

## 2024-05-20 LAB — PROTIME-INR
INR: 2.1 — ABNORMAL HIGH (ref 0.8–1.2)
Prothrombin Time: 24 s — ABNORMAL HIGH (ref 11.4–15.2)

## 2024-05-20 LAB — TROPONIN I (HIGH SENSITIVITY)
Troponin I (High Sensitivity): 8 ng/L (ref <18)
Troponin I (High Sensitivity): 6 ng/L (ref <18)

## 2024-05-20 LAB — RESP PANEL BY RT-PCR (RSV, FLU A&B, COVID)  RVPGX2
Influenza A by PCR: NEGATIVE
Influenza B by PCR: NEGATIVE
Resp Syncytial Virus by PCR: NEGATIVE
SARS Coronavirus 2 by RT PCR: NEGATIVE

## 2024-05-20 LAB — BLOOD GAS, VENOUS
Acid-Base Excess: 4.4 mmol/L — ABNORMAL HIGH (ref 0.0–2.0)
Bicarbonate: 32.7 mmol/L — ABNORMAL HIGH (ref 20.0–28.0)
O2 Saturation: 96.6 %
Patient temperature: 37
pCO2, Ven: 65 mmHg — ABNORMAL HIGH (ref 44–60)
pH, Ven: 7.31 (ref 7.25–7.43)
pO2, Ven: 68 mmHg — ABNORMAL HIGH (ref 32–45)

## 2024-05-20 LAB — APTT: aPTT: 44 s — ABNORMAL HIGH (ref 24–36)

## 2024-05-20 LAB — BRAIN NATRIURETIC PEPTIDE: B Natriuretic Peptide: 82.2 pg/mL (ref 0.0–100.0)

## 2024-05-20 MED ORDER — OXYMETAZOLINE HCL 0.05 % NA SOLN
1.0000 | Freq: Once | NASAL | Status: AC
  Administered 2024-05-20: 1 via NASAL
  Filled 2024-05-20: qty 30

## 2024-05-20 MED ORDER — FUROSEMIDE 10 MG/ML IJ SOLN
40.0000 mg | Freq: Once | INTRAMUSCULAR | Status: AC
  Administered 2024-05-20: 40 mg via INTRAVENOUS
  Filled 2024-05-20: qty 4

## 2024-05-20 MED ORDER — TRANEXAMIC ACID FOR EPISTAXIS
500.0000 mg | Freq: Once | TOPICAL | Status: AC
  Administered 2024-05-20: 500 mg via TOPICAL
  Filled 2024-05-20: qty 10

## 2024-05-20 NOTE — ED Provider Notes (Signed)
 [facility] Provider Note    Event Date/Time   First MD Initiated Contact with Patient 05/20/24 1955     (approximate)   History   Shortness of Breath   HPI  [name] is a 68 y.o. female  COPD, CHF, PE, T2DM, DVT, HTN, HLD, hypothyroidism, chronic respiratory failure on 6 L [place] at baseline who comes in with increasing shortness of breath.  Patient reports that she has not had her BiPAP machine for the past 3 days.  She is having to wear nasal cannula 24/7.  She reports that the dry air has made her nose start to bleed in her right nostril.  She denies any cough or fevers.  However she did feel more short of breath today.  She does have some swelling in her legs which she states is been a constant issue for her.  She does report taking Xarelto .   I reviewed a note from 04/19/2024 where patient was admitted treated for pneumonia, CHF, A-fib with RVR.   Physical Exam   Triage Vital Signs: ED Triage Vitals  Encounter Vitals Group     BP 05/20/24 2011 106/64     Systolic BP Percentile --      Diastolic BP Percentile --      Pulse Rate 05/20/24 2001 82     Resp 05/20/24 2008 14     Temp 05/20/24 2001 98.5 F (36.9 C)     Temp Source 05/20/24 2001 Axillary     SpO2 05/20/24 2008 100 %     Weight 05/20/24 2001 236 lb (107 kg)     Height 05/20/24 2001 5\' 2"  (1.575 m)     Head Circumference --      Peak Flow --      Pain Score 05/20/24 2002 0     Pain Loc --      Pain Education --      Exclude from Growth Chart --     Most recent vital signs: Vitals:   05/20/24 2011 05/20/24 2012  BP: 106/64   Pulse: 80 80  Resp: 17 14  Temp:    SpO2: 90% 100%     General: Awake, no distress.  CV:  Good peripheral perfusion.  Resp:  Normal effort. Clear lungs 6L sats 80%  Abd:  No distention. Soft and non tender  Other:  Swelling in legs  R nose-dried bloodsome dried blood noted   ED Results / Procedures / Treatments   Labs (all labs ordered are  listed, but only abnormal results are displayed) Labs Reviewed  CBC WITH DIFFERENTIAL/PLATELET - Abnormal; Notable for the following components:      Result Value   RBC 3.01 (*)    Hemoglobin 9.1 (*)    HCT 30.1 (*)    RDW 15.6 (*)    Platelets 108 (*)    Neutro Abs 7.9 (*)    Lymphs Abs 0.6 (*)    Abs Immature Granulocytes 0.20 (*)    All other components within normal limits  COMPREHENSIVE METABOLIC PANEL WITH GFR - Abnormal; Notable for the following components:   Glucose, Bld 112 (*)    Calcium  8.3 (*)    Total Protein 6.0 (*)    Albumin 2.8 (*)    AST 12 (*)    All other components within normal limits  PROTIME-INR - Abnormal; Notable for the following components:   Prothrombin Time 24.0 (*)    INR 2.1 (*)    All other  components within normal limits  APTT - Abnormal; Notable for the following components:   aPTT 44 (*)    All other components within normal limits  BLOOD GAS, VENOUS - Abnormal; Notable for the following components:   pCO2, Ven 65 (*)    pO2, Ven 68 (*)    Bicarbonate 32.7 (*)    Acid-Base Excess 4.4 (*)    All other components within normal limits  RESP PANEL BY RT-PCR (RSV, FLU A&B, COVID)  RVPGX2  BRAIN NATRIURETIC PEPTIDE  TROPONIN I (HIGH SENSITIVITY)  TROPONIN I (HIGH SENSITIVITY)     EKG  My interpretation of EKG:  Normal sinus rhythm 83 without any ST elevation or T wave inversions, normal intervals except for incomplete right bundle branch block  RADIOLOGY I have reviewed the xray personally and interpreted some evidence of edema   PROCEDURES:  Critical Care performed: Yes, see critical care procedure note(s)  .1-3 Lead EKG Interpretation  Performed by: Lubertha Rush, MD Authorized by: Lubertha Rush, MD     Interpretation: normal     ECG rate:  80   ECG rate assessment: normal     Rhythm: sinus rhythm     Ectopy: none     Conduction: normal   .Critical Care  Performed by: Lubertha Rush, MD Authorized by: Lubertha Rush, MD    Critical care provider statement:    Critical care time (minutes):  30   Critical care was necessary to treat or prevent imminent or life-threatening deterioration of the following conditions:  Respiratory failure   Critical care was time spent personally by me on the following activities:  Development of treatment plan with patient or surrogate, discussions with consultants, evaluation of patient's response to treatment, examination of patient, ordering and review of laboratory studies, ordering and review of radiographic studies, ordering and performing treatments and interventions, pulse oximetry, re-evaluation of patient's condition and review of old charts Epistaxis Management  Date/Time: 05/20/2024 11:04 PM  Performed by: Lubertha Rush, MD Authorized by: Lubertha Rush, MD   Consent:    Consent obtained:  Verbal   Risks discussed:  Bleeding   Alternatives discussed:  No treatment Universal protocol:    Patient identity confirmed:  Verbally with patient Anesthesia:    Anesthesia method:  None Procedure details:    Treatment site:  R anterior   Treatment method:  Merocel sponge   Treatment complexity:  Limited Post-procedure details:    Assessment:  Bleeding decreased   Procedure completion:  Tolerated well, no immediate complications    MEDICATIONS ORDERED IN ED: Medications  tranexamic acid (CYKLOKAPRON) 1000 MG/10ML topical solution 500 mg (has no administration in time range)  oxymetazoline (AFRIN) 0.05 % nasal spray 1 spray (1 spray Each Nare Given 05/20/24 2012)  furosemide  (LASIX ) injection 40 mg (40 mg Intravenous Given 05/20/24 2129)     IMPRESSION / MDM / ASSESSMENT AND PLAN / ED COURSE  I reviewed the triage vital signs and the nursing notes.   Patient's presentation is most consistent with acute presentation with potential threat to life or bodily function.   Patient comes in with concerns for worsening shortness of breath satting in the 70s to 80s on baseline  oxygen.  Patient looks fluid overloaded on examination doubt PE given on Xarelto .  No wheezing to suggest COPD.  She does have a intermittent nosebleed out of the right nostril.  When initially saw patient there was no active bleeding but when I came back into  the room later she was having some bleeding.  Therefore I decided to place patient with a Merocel packing.  Initially I placed patient on a nonrebreather given this intermittent nosebleed we want to make sure we got this controlled.  Troponin is negative VBG shows slight elevation of CO2 but patient does report she is supposed be on BiPAP at nighttime therefore I will place patient on BiPAP over high flow nasal cannula.  Troponins are negative.  Hemoglobin is 9.1.  CMP reassuring.  Patient's hemoglobin from 2 weeks ago was 8.8.  Patient had lied flat did have some coughing up blood but when I went to go reevaluate patient there was no new nasal bleeding she denies any posterior bleeding down the back of her throat.  I suspect that this is just residual from the recent bleed.  Would at this time we discussed trying to put in a nasal balloon versus another Merisel given he had a lot of difficulties with me placing it she reported a lot of discomfort.  Given there were no signs of active bleeding right now she wanted to hold off and see how this Merisel did.  I have discussed with the hospital team for admission given new oxygen requirement  The patient is on the cardiac monitor to evaluate for evidence of arrhythmia and/or significant heart rate changes.      FINAL CLINICAL IMPRESSION(S) / ED DIAGNOSES   Final diagnoses:  Epistaxis  Acute respiratory failure with hypoxia (HCC)  Acute on chronic congestive heart failure, unspecified heart failure type (HCC)     Rx / DC Orders   ED Discharge Orders     None        Note:  This document was prepared using Dragon voice recognition software and may include unintentional dictation  errors.   Lubertha Rush, MD 05/20/24 2308

## 2024-05-20 NOTE — ED Triage Notes (Addendum)
 Patient C/O SOB that began about thirty minutes ago. Patient is on 5-6 liters [place] at baseline, but saturations were only in the low 80's. Patient in triage is 78% on 6L [place]. Patient placed on NRB

## 2024-05-20 NOTE — ED Notes (Signed)
 Pt at bedside states, "I think I'm dirty. I'm pretty sure I had a bowel movement before I left the facility, and I told them. They didn't clean me up." Pt covered in feces and urine. Pericare provided, sheets changed, purewick in place, chucks pads placed under pt, and new brief applied. Pt tolerated well. Stretcher in low position with side rails up x 2. Call light wtihin reach.

## 2024-05-20 NOTE — ED Notes (Signed)
 Pt continuing to experience epistaxis and coughing up blood that trickles down back of throat. Mike Alcon, MD notified. Merocel inserted in pt's right nare with TXA administered by Mike Alcon, MD at bedside.

## 2024-05-20 NOTE — Hospital Course (Signed)
 [name]

## 2024-05-21 DIAGNOSIS — I4891 Unspecified atrial fibrillation: Secondary | ICD-10-CM

## 2024-05-21 DIAGNOSIS — E890 Postprocedural hypothyroidism: Secondary | ICD-10-CM | POA: Diagnosis present

## 2024-05-21 DIAGNOSIS — E063 Autoimmune thyroiditis: Secondary | ICD-10-CM | POA: Diagnosis present

## 2024-05-21 DIAGNOSIS — I480 Paroxysmal atrial fibrillation: Secondary | ICD-10-CM

## 2024-05-21 DIAGNOSIS — Z7984 Long term (current) use of oral hypoglycemic drugs: Secondary | ICD-10-CM | POA: Diagnosis not present

## 2024-05-21 DIAGNOSIS — I4892 Unspecified atrial flutter: Secondary | ICD-10-CM | POA: Diagnosis present

## 2024-05-21 DIAGNOSIS — E7800 Pure hypercholesterolemia, unspecified: Secondary | ICD-10-CM | POA: Diagnosis present

## 2024-05-21 DIAGNOSIS — E1140 Type 2 diabetes mellitus with diabetic neuropathy, unspecified: Secondary | ICD-10-CM | POA: Diagnosis present

## 2024-05-21 DIAGNOSIS — I5033 Acute on chronic diastolic (congestive) heart failure: Secondary | ICD-10-CM

## 2024-05-21 DIAGNOSIS — J9621 Acute and chronic respiratory failure with hypoxia: Secondary | ICD-10-CM

## 2024-05-21 DIAGNOSIS — R040 Epistaxis: Principal | ICD-10-CM

## 2024-05-21 DIAGNOSIS — Z86718 Personal history of other venous thrombosis and embolism: Secondary | ICD-10-CM

## 2024-05-21 DIAGNOSIS — Z833 Family history of diabetes mellitus: Secondary | ICD-10-CM | POA: Diagnosis not present

## 2024-05-21 DIAGNOSIS — D67 Hereditary factor IX deficiency: Secondary | ICD-10-CM | POA: Diagnosis present

## 2024-05-21 DIAGNOSIS — Z7901 Long term (current) use of anticoagulants: Secondary | ICD-10-CM

## 2024-05-21 DIAGNOSIS — E66813 Obesity, class 3: Secondary | ICD-10-CM | POA: Insufficient documentation

## 2024-05-21 DIAGNOSIS — J9622 Acute and chronic respiratory failure with hypercapnia: Secondary | ICD-10-CM | POA: Diagnosis present

## 2024-05-21 DIAGNOSIS — Z79890 Hormone replacement therapy (postmenopausal): Secondary | ICD-10-CM | POA: Diagnosis not present

## 2024-05-21 DIAGNOSIS — Z91048 Other nonmedicinal substance allergy status: Secondary | ICD-10-CM | POA: Diagnosis not present

## 2024-05-21 DIAGNOSIS — Z86711 Personal history of pulmonary embolism: Secondary | ICD-10-CM

## 2024-05-21 DIAGNOSIS — Z87891 Personal history of nicotine dependence: Secondary | ICD-10-CM | POA: Diagnosis not present

## 2024-05-21 DIAGNOSIS — Z8249 Family history of ischemic heart disease and other diseases of the circulatory system: Secondary | ICD-10-CM | POA: Diagnosis not present

## 2024-05-21 DIAGNOSIS — I110 Hypertensive heart disease with heart failure: Secondary | ICD-10-CM | POA: Diagnosis present

## 2024-05-21 DIAGNOSIS — G4733 Obstructive sleep apnea (adult) (pediatric): Secondary | ICD-10-CM | POA: Diagnosis present

## 2024-05-21 DIAGNOSIS — I509 Heart failure, unspecified: Secondary | ICD-10-CM

## 2024-05-21 DIAGNOSIS — M069 Rheumatoid arthritis, unspecified: Secondary | ICD-10-CM | POA: Diagnosis present

## 2024-05-21 DIAGNOSIS — Z6841 Body Mass Index (BMI) 40.0 and over, adult: Secondary | ICD-10-CM | POA: Diagnosis not present

## 2024-05-21 DIAGNOSIS — Z1152 Encounter for screening for COVID-19: Secondary | ICD-10-CM | POA: Diagnosis not present

## 2024-05-21 DIAGNOSIS — J439 Emphysema, unspecified: Secondary | ICD-10-CM | POA: Diagnosis present

## 2024-05-21 DIAGNOSIS — G8929 Other chronic pain: Secondary | ICD-10-CM | POA: Diagnosis present

## 2024-05-21 LAB — BASIC METABOLIC PANEL WITH GFR
Anion gap: 7 (ref 5–15)
BUN: 28 mg/dL — ABNORMAL HIGH (ref 8–23)
CO2: 30 mmol/L (ref 22–32)
Calcium: 8.1 mg/dL — ABNORMAL LOW (ref 8.9–10.3)
Chloride: 103 mmol/L (ref 98–111)
Creatinine, Ser: 0.76 mg/dL (ref 0.44–1.00)
GFR, Estimated: >60 mL/min (ref >60)
Glucose, Bld: 77 mg/dL (ref 70–99)
Potassium: 3.6 mmol/L (ref 3.5–5.1)
Sodium: 140 mmol/L (ref 135–145)

## 2024-05-21 LAB — CBG MONITORING, ED
Glucose-Capillary: 69 mg/dL — ABNORMAL LOW (ref 70–99)
Glucose-Capillary: 89 mg/dL (ref 70–99)

## 2024-05-21 LAB — HIV ANTIBODY (ROUTINE TESTING W REFLEX): HIV Screen 4th Generation wRfx: NONREACTIVE

## 2024-05-21 LAB — CBC
HCT: 29.4 % — ABNORMAL LOW (ref 36.0–46.0)
Hemoglobin: 9 g/dL — ABNORMAL LOW (ref 12.0–15.0)
MCH: 30.4 pg (ref 26.0–34.0)
MCHC: 30.6 g/dL (ref 30.0–36.0)
MCV: 99.3 fL (ref 80.0–100.0)
Platelets: 100 10*3/uL — ABNORMAL LOW (ref 150–400)
RBC: 2.96 MIL/uL — ABNORMAL LOW (ref 3.87–5.11)
RDW: 15.6 % — ABNORMAL HIGH (ref 11.5–15.5)
WBC: 8.2 10*3/uL (ref 4.0–10.5)
nRBC: 0 % (ref 0.0–0.2)

## 2024-05-21 LAB — GLUCOSE, CAPILLARY
Glucose-Capillary: 74 mg/dL (ref 70–99)
Glucose-Capillary: 102 mg/dL — ABNORMAL HIGH (ref 70–99)

## 2024-05-21 MED ORDER — ACETAMINOPHEN 650 MG RE SUPP: 650.0000 mg | Freq: Four times a day (QID) | RECTAL | Status: DC | PRN

## 2024-05-21 MED ORDER — DILTIAZEM HCL ER BEADS 240 MG PO CP24: 360.0000 mg | ORAL_CAPSULE | Freq: Every day | ORAL | Status: DC

## 2024-05-21 MED ORDER — MELATONIN 5 MG PO TABS
5.0000 mg | ORAL_TABLET | Freq: Every day | ORAL | Status: DC
  Administered 2024-05-21 – 2024-05-23 (×4): 5 mg via ORAL
  Filled 2024-05-21 (×5): qty 1

## 2024-05-21 MED ORDER — TRAMADOL HCL 50 MG PO TABS
50.0000 mg | ORAL_TABLET | Freq: Two times a day (BID) | ORAL | Status: DC | PRN
  Administered 2024-05-21 – 2024-05-22 (×2): 50 mg via ORAL
  Filled 2024-05-21 (×2): qty 1

## 2024-05-21 MED ORDER — HYDROXYCHLOROQUINE SULFATE 200 MG PO TABS
200.0000 mg | ORAL_TABLET | Freq: Every morning | ORAL | Status: DC
  Administered 2024-05-21 – 2024-05-24 (×4): 200 mg via ORAL
  Filled 2024-05-21 (×4): qty 1

## 2024-05-21 MED ORDER — ONDANSETRON HCL 4 MG/2ML IJ SOLN: 4.0000 mg | Freq: Four times a day (QID) | INTRAMUSCULAR | Status: DC | PRN

## 2024-05-21 MED ORDER — FUROSEMIDE 10 MG/ML IJ SOLN
40.0000 mg | Freq: Two times a day (BID) | INTRAMUSCULAR | Status: DC
  Administered 2024-05-21: 40 mg via INTRAVENOUS
  Filled 2024-05-21: qty 4

## 2024-05-21 MED ORDER — SENNOSIDES-DOCUSATE SODIUM 8.6-50 MG PO TABS: 1.0000 | ORAL_TABLET | Freq: Every evening | ORAL | Status: DC | PRN

## 2024-05-21 MED ORDER — INSULIN ASPART 100 UNIT/ML IJ SOLN
0.0000 [IU] | Freq: Three times a day (TID) | INTRAMUSCULAR | Status: DC
  Administered 2024-05-22 – 2024-05-23 (×2): 3 [IU] via SUBCUTANEOUS
  Administered 2024-05-23: 2 [IU] via SUBCUTANEOUS
  Administered 2024-05-24: 3 [IU] via SUBCUTANEOUS
  Filled 2024-05-21 (×5): qty 1

## 2024-05-21 MED ORDER — SIMVASTATIN 20 MG PO TABS
10.0000 mg | ORAL_TABLET | Freq: Every day | ORAL | Status: DC
  Administered 2024-05-21 – 2024-05-24 (×4): 10 mg via ORAL
  Filled 2024-05-21 (×4): qty 1

## 2024-05-21 MED ORDER — ARFORMOTEROL TARTRATE 15 MCG/2ML IN NEBU
15.0000 ug | INHALATION_SOLUTION | Freq: Two times a day (BID) | RESPIRATORY_TRACT | Status: DC
  Administered 2024-05-21 – 2024-05-24 (×5): 15 ug via RESPIRATORY_TRACT
  Filled 2024-05-21 (×8): qty 2

## 2024-05-21 MED ORDER — ACETAMINOPHEN 325 MG PO TABS
650.0000 mg | ORAL_TABLET | Freq: Four times a day (QID) | ORAL | Status: DC | PRN
Start: 2024-05-21 — End: 2024-05-25

## 2024-05-21 MED ORDER — FUROSEMIDE 40 MG PO TABS: 80.0000 mg | ORAL_TABLET | Freq: Two times a day (BID) | ORAL | Status: DC

## 2024-05-21 MED ORDER — ALBUTEROL SULFATE (2.5 MG/3ML) 0.083% IN NEBU
2.5000 mg | INHALATION_SOLUTION | RESPIRATORY_TRACT | Status: DC | PRN
Start: 2024-05-21 — End: 2024-05-25

## 2024-05-21 MED ORDER — FUROSEMIDE 40 MG PO TABS
20.0000 mg | ORAL_TABLET | Freq: Every day | ORAL | Status: DC
  Administered 2024-05-21: 20 mg via ORAL
  Filled 2024-05-21: qty 1

## 2024-05-21 MED ORDER — BUDESONIDE 0.5 MG/2ML IN SUSP
0.5000 mg | Freq: Two times a day (BID) | RESPIRATORY_TRACT | Status: DC
  Administered 2024-05-21 – 2024-05-24 (×7): 0.5 mg via RESPIRATORY_TRACT
  Filled 2024-05-21 (×7): qty 2

## 2024-05-21 MED ORDER — LEVOTHYROXINE SODIUM 50 MCG PO TABS
150.0000 ug | ORAL_TABLET | Freq: Every day | ORAL | Status: DC
  Administered 2024-05-21 – 2024-05-24 (×4): 150 ug via ORAL
  Filled 2024-05-21: qty 1
  Filled 2024-05-21: qty 3
  Filled 2024-05-21 (×2): qty 1

## 2024-05-21 MED ORDER — FUROSEMIDE 10 MG/ML IJ SOLN
40.0000 mg | Freq: Two times a day (BID) | INTRAMUSCULAR | Status: DC
  Administered 2024-05-22 – 2024-05-23 (×2): 40 mg via INTRAVENOUS
  Filled 2024-05-21 (×2): qty 4

## 2024-05-21 MED ORDER — INSULIN ASPART 100 UNIT/ML IJ SOLN: 0.0000 [IU] | Freq: Every day | INTRAMUSCULAR | Status: DC

## 2024-05-21 MED ORDER — DILTIAZEM HCL ER COATED BEADS 180 MG PO CP24
360.0000 mg | ORAL_CAPSULE | Freq: Every day | ORAL | Status: DC
  Filled 2024-05-21: qty 2

## 2024-05-21 MED ORDER — REVEFENACIN 175 MCG/3ML IN SOLN
175.0000 ug | Freq: Every day | RESPIRATORY_TRACT | Status: DC
  Administered 2024-05-21 – 2024-05-24 (×4): 175 ug via RESPIRATORY_TRACT
  Filled 2024-05-21 (×4): qty 3

## 2024-05-21 MED ORDER — PREDNISONE 10 MG PO TABS
5.0000 mg | ORAL_TABLET | Freq: Every day | ORAL | Status: DC
  Administered 2024-05-22 – 2024-05-23 (×2): 5 mg via ORAL
  Filled 2024-05-21 (×2): qty 1

## 2024-05-21 MED ORDER — FUROSEMIDE 10 MG/ML IJ SOLN: 40.0000 mg | Freq: Two times a day (BID) | INTRAMUSCULAR | Status: DC

## 2024-05-21 MED ORDER — RIVAROXABAN 20 MG PO TABS
20.0000 mg | ORAL_TABLET | Freq: Every day | ORAL | Status: DC
  Administered 2024-05-21 – 2024-05-24 (×4): 20 mg via ORAL
  Filled 2024-05-21 (×5): qty 1

## 2024-05-21 MED ORDER — LEFLUNOMIDE 10 MG PO TABS: 10.0000 mg | ORAL_TABLET | ORAL | Status: DC

## 2024-05-21 MED ORDER — TRAZODONE HCL 50 MG PO TABS
50.0000 mg | ORAL_TABLET | Freq: Every day | ORAL | Status: DC
  Administered 2024-05-21 – 2024-05-23 (×4): 50 mg via ORAL
  Filled 2024-05-21 (×4): qty 1

## 2024-05-21 MED ORDER — GABAPENTIN 100 MG PO CAPS
100.0000 mg | ORAL_CAPSULE | Freq: Three times a day (TID) | ORAL | Status: DC
  Administered 2024-05-21: 100 mg via ORAL
  Filled 2024-05-21 (×2): qty 1

## 2024-05-21 MED ORDER — ONDANSETRON HCL 4 MG PO TABS
4.0000 mg | ORAL_TABLET | Freq: Four times a day (QID) | ORAL | Status: DC | PRN
Start: 2024-05-21 — End: 2024-05-25

## 2024-05-21 MED ORDER — AMIODARONE HCL 200 MG PO TABS
200.0000 mg | ORAL_TABLET | Freq: Every day | ORAL | Status: DC
  Administered 2024-05-21 – 2024-05-24 (×4): 200 mg via ORAL
  Filled 2024-05-21 (×4): qty 1

## 2024-05-21 MED ORDER — GABAPENTIN 400 MG PO CAPS
400.0000 mg | ORAL_CAPSULE | Freq: Three times a day (TID) | ORAL | Status: DC
  Administered 2024-05-21 – 2024-05-24 (×10): 400 mg via ORAL
  Filled 2024-05-21 (×10): qty 1

## 2024-05-21 MED ORDER — DILTIAZEM HCL ER COATED BEADS 180 MG PO CP24
360.0000 mg | ORAL_CAPSULE | Freq: Every day | ORAL | Status: DC
  Administered 2024-05-22 – 2024-05-24 (×3): 360 mg via ORAL
  Filled 2024-05-21 (×4): qty 2

## 2024-05-21 NOTE — TOC Initial Note (Signed)
 Transition of Care [facility]) - Initial/Assessment Note    Patient Details  Name: [name] MRN: [id number] Date of Birth: April 13, 1956  [facility]) CM/SW Contact:    Arminda Landmark, RN Phone Number: 05/21/2024, 10:17 AM  Clinical Narrative:                 [facility] consult made as pt said her Bipap machine is broken. Spoke with Vickie at [employer] and she states she got a new Bipap machine yesterday. Will follow up with the MD and pt. TOC to continue to follow.         Patient Goals and CMS Choice            Expected Discharge Plan and Services                                              Prior Living Arrangements/Services                       Activities of Daily Living      Permission Sought/Granted                  Emotional Assessment              Admission diagnosis:  CHF exacerbation (HCC) [I50.9] Patient Active Problem List   Diagnosis Date Noted   History of pulmonary embolism 05/21/2024   Obesity, Class III, BMI 40-49.9 (morbid obesity) 05/21/2024   Atrial fibrillation (HCC) 05/21/2024   Chronic anticoagulation 05/21/2024   Acute on chronic respiratory failure with hypoxia (HCC) 05/21/2024   Epistaxis 05/21/2024   History of DVT (deep vein thrombosis) 05/21/2024   Acute on chronic heart failure with preserved ejection fraction (HFpEF) (HCC) 05/20/2024   Type 2 diabetes mellitus without complication, without long-term current use of insulin (HCC) 05/28/2023   OSA on CPAP 05/29/2022   COVID-19 01/05/2021   Hypoxia    Chronic obstructive pulmonary disease (HCC)    Mitral valve annular calcification 04/07/2018   Depression 04/07/2018   Diastolic heart failure (HCC) 02/10/2018   Family history of hemophilia B 12/02/2017   Healthcare maintenance 05/07/2017   Onychomycosis 05/07/2017   Tricompartment osteoarthritis of both knees 02/19/2017   Hyperlipidemia 11/14/2016   AKI (acute kidney injury) (HCC)  04/23/2015   Rheumatoid arthritis (HCC) 07/08/2008   OBESITY 05/04/2008   Essential hypertension 05/04/2008   ALLERGIC RHINITIS 05/04/2008   GERD 05/04/2008   VARICOSE VEINS LOWER EXTREMITIES W/INFLAMMATION 11/02/2007   Hypothyroidism 08/18/2007   PCP:  [name], PA-C Pharmacy:   [facility] [address] [place], [place] - [address] AT [facility] [address] [place]. [place] [address] Phone: [phone] Fax: [phone]  [facility] [address] [place] [address] Phone: [phone] Fax: [phone]     Social Drivers of Health (SDOH) Social History: SDOH Screenings   Food Insecurity: Low Risk  (05/23/2023)   Received from [facility]  Housing: Low Risk  (05/23/2023)   Received from [facility]  Transportation Needs: No Transportation Needs (05/23/2023)   Received from [facility]  Utilities: Low Risk  (05/23/2023)   Received from [facility]  Tobacco Use: Medium Risk (05/20/2024)   SDOH Interventions:  Readmission Risk Interventions     No data to display

## 2024-05-21 NOTE — Assessment & Plan Note (Addendum)
 Not acutely exacerbated Continue home inhalers with DuoNebs  prn

## 2024-05-21 NOTE — Plan of Care (Signed)
   Problem: Education: Goal: Ability to describe self-care measures that may prevent or decrease complications (Diabetes Survival Skills Education) will improve Outcome: Progressing   Problem: Coping: Goal: Ability to adjust to condition or change in health will improve Outcome: Progressing   Problem: Fluid Volume: Goal: Ability to maintain a balanced intake and output will improve Outcome: Progressing

## 2024-05-21 NOTE — ED Notes (Signed)
 Pt called out due to having to spit out a blood clot from her mouth, this tech assisted pt with vomit back to spit in and tissues to wipe pt's face. Canister also almost full of urine upon entering the room as well , this tech emptied canister. Pt had around 1100 ml in canister. Pt has no other needs at the moment.

## 2024-05-21 NOTE — Assessment & Plan Note (Signed)
 Continue levothyroxine 

## 2024-05-21 NOTE — Assessment & Plan Note (Signed)
 Continue Xarelto

## 2024-05-21 NOTE — Assessment & Plan Note (Signed)
 Acute on chronic respiratory failure with hypoxia and hypercapnia Continue BiPAP as tolerated IV Lasix  Continue home GDMT

## 2024-05-21 NOTE — ED Notes (Signed)
 MD made aware of BS. Wants to trial pt off bipap and on [place] and to remove packing in nose.

## 2024-05-21 NOTE — ED Notes (Signed)
 Attempted to medicate pt at this time. Pt off bipap for a few minutes and sats dropped down to 59 on RA. Pt placed back on bipap. Other oral meds held at this time.

## 2024-05-21 NOTE — ED Notes (Signed)
 Brovana inhaler has not come up from pharmacy at this time.

## 2024-05-21 NOTE — H&P (Signed)
 History and Physical    Patient: [name]:[id number] DOB: 12-19-1955 DOA: 05/20/2024 DOS: the patient was seen and examined on 05/21/2024 PCP: [name], PA-C  Patient coming from: SNF  Chief Complaint:  Chief Complaint  Patient presents with   Shortness of Breath    HPI: [name] is a 68 y.o. female with medical history significant for COPD, rheumatoid arthritis, CHF, PE, T2DM, DVT, HTN, HLD, hypothyroidism, chronic respiratory failure on 6 L [place] and nighttime Bipap,  morbid obesity, nicotine dependence, atrial fibrillation s/p cardioversion on 04/22/2024, currently on amiodarone and Xarelto , recently hospitalized from 5/5 to 04/30/2024 at [facility] for multifactorial respiratory failure related to CHF and pneumonia (EF 55 to 60%),  presenting to the ED with shortness of breath that started about 30 minutes prior to arrival, with O2 sats in the high 70s on her home flow rate of 6 L.  She states her BiPAP has been broken for the past 3 days.  Denies chest pain. ED course and data review: Vitals within normal limits. pH 7.31, PCO265 and PO268 CBC normal WBC, hemoglobin 9.1 and platelet 108.  Respiratory viral panel negative Troponin 6 and BNP 82  EKG with a flutter at 83  Chest x-ray with vascular congestion as follows: IMPRESSION: Cardiac enlargement with pulmonary vascular congestion and mild pulmonary infiltrates, likely edema. Small bilateral pleural effusions.   Patient treated with Lasix   Patient developed epistaxis while in the ED, bleeding controlled with Merocel  Was consulted for admission     Review of Systems: As mentioned in the history of present illness. All other systems reviewed and are negative.  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    HTN  (hypertension)    Hypercholesteremia    Hypothyroid    Rheumatoid arthritis(714.0)    Varicose vein of leg    Past Surgical History:  Procedure Laterality Date   APPENDECTOMY     CHOLECYSTECTOMY     ELBOW SURGERY     LEFT   ENDOSCOPIC VEIN LASER TREATMENT     RIGHT LEG   EXPLORATORY LAPAROTOMY     HAND SURGERY     RIGHT    HEEL SPUR SURGERY     BILAT   INSERTION OF MESH N/A 08/04/2017   Procedure: INSERTION OF MESH;  Surgeon: Oralee Billow, MD;  Location: [facility] OR;  Service: General;  Laterality: N/A;   partial thyroidectomy  2015   TONSILLECTOMY     TUBAL LIGATION     VENTRAL HERNIA REPAIR N/A 08/04/2017   Procedure: LAPAROSCOPIC VENTRAL INCISIONAL HERNIA REPAIR WITH MESH;  Surgeon: Oralee Billow, MD;  Location: [facility] OR;  Service: General;  Laterality: N/A;   Social History:  reports that she quit smoking about 4 years ago. Her smoking use included cigarettes. She started smoking about 44 years ago. She has a 40 pack-year smoking history. She has never used smokeless tobacco. She reports that she does not drink alcohol and does not use drugs.  Allergies  Allergen Reactions   Oxycodone -Acetaminophen  Nausea Only and Other (See Comments)    Violent Vomiting Violent Vomiting   Penicillins Anaphylaxis, Swelling and Rash    Has patient had a PCN reaction causing immediate rash, facial/tongue/throat swelling, SOB or lightheadedness with  hypotension: Yes Has patient had a PCN reaction causing severe rash involving mucus membranes or skin necrosis: No Has patient had a PCN reaction that required hospitalization No Has patient had a PCN reaction occurring within the last 10 years: No If all of the above answers are "NO", then may proceed with Cephalosporin use.    Latex Hives and Itching    Blisters (also)   Oxycodone -Acetaminophen  Nausea And Vomiting   Tyloxapol Nausea And Vomiting   Adhesive [Tape] Rash    Patient prefers paper tape   Strawberry Extract Hives, Itching and Rash     Family History  Problem Relation Age of Onset   Diabetes Mother    Kidney disease Mother    Lung cancer Father    Alzheimer's disease Maternal Grandmother    Stroke Maternal Grandfather    Heart disease Paternal Grandmother    Unexplained death Paternal Grandfather     Prior to Admission medications   Medication Sig Start Date End Date Taking? Authorizing Provider  amiodarone (PACERONE) 200 MG tablet Take 1 tablet by mouth daily. 05/01/24 07/30/24 Yes [provider]  budesonide (PULMICORT) 0.5 MG/2ML nebulizer solution Take 0.5 mg by nebulization in the morning and at bedtime. 04/30/24 07/29/24 Yes [provider]  diltiazem (TIAZAC) 360 MG 24 hr capsule Take 360 mg by mouth daily. 05/01/24 07/30/24 Yes [provider]  formoterol (PERFOROMIST) 20 MCG/2ML nebulizer solution Inhale 20 mcg into the lungs in the morning and at bedtime. 04/30/24 07/29/24 Yes [provider]  furosemide  (LASIX ) 80 MG tablet Take 80 mg by mouth 2 (two) times daily. 03/26/24  Yes [provider]  gabapentin (NEURONTIN) 100 MG capsule Take 100 mg by mouth 3 (three) times daily. 03/30/24  Yes [provider]  gabapentin (NEURONTIN) 400 MG capsule Take 400 mg by mouth 3 (three) times daily. Take with 100 mg capsule for total daily dose on 500 mg 3 times daily. 09/16/22  Yes [provider]  hydroxychloroquine (PLAQUENIL) 200 MG tablet Take 200 mg by mouth in the morning. 09/02/21  Yes [provider]  metFORMIN (GLUCOPHAGE) 500 MG tablet Take 500 mg by mouth in the morning. 07/03/21  Yes [provider]  pantoprazole  (PROTONIX ) 40 MG tablet Take 40 mg by mouth 2 (two) times daily. 01/11/14  Yes [provider]  revefenacin (YUPELRI) 175 MCG/3ML nebulizer solution Inhale 175 mcg into the lungs daily. 05/01/24 05/31/24 Yes [provider]  simvastatin  (ZOCOR ) 10 MG tablet Take 10 mg by mouth at bedtime. 12/26/22  Yes [provider]  traZODone (DESYREL) 50 MG tablet Take 50 mg by mouth at bedtime. 03/24/24  Yes [provider]  Ascorbic Acid (VITAMIN C ) 1000 MG tablet Take 1 tablet (1,000 mg total) by mouth daily. 01/31/21   Medina-Vargas, Monina C, NP  cholecalciferol (VITAMIN D) 1000 units tablet take 1 tablet by mouth once daily 12/15/17   [provider]  cyclobenzaprine  (FLEXERIL ) 10 MG tablet Take 10 mg by mouth 3 (three) times daily.    [provider]  furosemide  (LASIX ) 20 MG tablet Take 1 tablet (20 mg total) by mouth daily. 02/01/21   Medina-Vargas, Monina C, NP  leflunomide  (ARAVA ) 10 MG tablet Take 1 tablet (10 mg total) by mouth See admin instructions. Monday-friday 01/31/21   Medina-Vargas, Monina C, NP  levothyroxine  (SYNTHROID ) 150 MCG tablet Take 1 tablet (150 mcg total) by mouth daily before breakfast. 01/31/21   Medina-Vargas, Monina C, NP  melatonin (MELATONIN MAXIMUM STRENGTH) 5  MG TABS Take 5 mg by mouth at bedtime.    [provider]  montelukast (SINGULAIR) 10 MG tablet Take 10 mg by mouth daily.    [provider]  Multiple Vitamins-Minerals (ONE-A-DAY WOMENS 50+ ADVANTAGE) TABS Take 1 tablet by mouth daily.    [provider]  ondansetron  (ZOFRAN  ODT) 4 MG disintegrating tablet Take 1 tablet (4 mg total) by mouth every 8 (eight) hours as needed. 01/11/20   Long, Shereen Dike, MD  pantoprazole  (PROTONIX ) 40 MG tablet TAKE TWO TABLETS BY MOUTH AT BEDTIME 01/31/21   Medina-Vargas, Monina C, NP  polyethylene glycol (MIRALAX  / GLYCOLAX ) 17 g packet Take 17 g by mouth daily as needed for mild constipation. 01/10/21   Mordechai April, DO  potassium chloride  (KLOR-CON ) 10 MEQ tablet Take 1 tablet (10 mEq total) by mouth daily. 02/01/21   Medina-Vargas, Monina C, NP  predniSONE  (DELTASONE ) 5 MG tablet Take 1 tablet (5 mg total) by mouth daily. 01/31/21   Medina-Vargas, Monina C, NP  senna-docusate (SENOKOT-S) 8.6-50 MG tablet Take 1 tablet by mouth at bedtime  as needed for mild constipation or moderate constipation. 01/11/20   Long, Shereen Dike, MD  simvastatin  (ZOCOR ) 20 MG tablet Take 1 tablet (20 mg total) by mouth daily at 6 PM. 01/31/21   Medina-Vargas, Monina C, NP  traMADol  (ULTRAM ) 50 MG tablet TAKE 1 TABLET (50MG ) BY MOUTH 3 TIMES DAILY AS NEEDED FOR MODERATE OR SEVERE PAIN 01/31/21   Medina-Vargas, Monina C, NP  TRELEGY ELLIPTA 100-62.5-25 MCG/ACT AEPB Inhale 1 puff into the lungs daily.    [provider]  XARELTO  20 MG TABS tablet Take 1 tablet (20 mg total) by mouth daily. 01/31/21   Medina-Vargas, Nino Bass C, NP    Physical Exam: Vitals:   05/20/24 2330 05/21/24 0000 05/21/24 0030 05/21/24 0100  BP: (!) 141/77 131/74 119/70 95/68  Pulse: 72 78 71 82  Resp: 15 12 12 20   Temp:      TempSrc:      SpO2: 100% 95% 95% 98%  Weight:      Height:       Physical Exam Vitals and nursing note reviewed.  Constitutional:      General: She is not in acute distress. HENT:     Head: Normocephalic and atraumatic.  Cardiovascular:     Rate and Rhythm: Normal rate and regular rhythm.     Heart sounds: Normal heart sounds.  Pulmonary:     Effort: Pulmonary effort is normal.     Breath sounds: Normal breath sounds.  Abdominal:     Palpations: Abdomen is soft.     Tenderness: There is no abdominal tenderness.  Musculoskeletal:     Right lower leg: Edema present.     Left lower leg: Edema present.  Neurological:     Mental Status: Mental status is at baseline.     Labs on Admission: I have personally reviewed following labs and imaging studies  CBC: Recent Labs  Lab 05/20/24 2006  WBC 9.2  NEUTROABS 7.9*  HGB 9.1*  HCT 30.1*  MCV 100.0  PLT 108*   Basic Metabolic Panel: Recent Labs  Lab 05/20/24 2006  NA 139  K 4.6  CL 105  CO2 28  GLUCOSE 112*  BUN 21  CREATININE 0.75  CALCIUM  8.3*   GFR: Estimated Creatinine Clearance: 79.6 mL/min (by C-G formula based on SCr of 0.75 mg/dL). Liver Function Tests: Recent Labs   Lab 05/20/24 2006  AST 12*  ALT 14  ALKPHOS 55  BILITOT 0.6  PROT 6.0*  ALBUMIN 2.8*   No results for input(s): "LIPASE", "AMYLASE" in the last 168 hours. No results for input(s): "AMMONIA" in the last 168 hours. Coagulation Profile: Recent Labs  Lab 05/20/24 2006  INR 2.1*   Cardiac Enzymes: No results for input(s): "CKTOTAL", "CKMB", "CKMBINDEX", "TROPONINI" in the last 168 hours. BNP (last 3 results) No results for input(s): "PROBNP" in the last 8760 hours. HbA1C: No results for input(s): "HGBA1C" in the last 72 hours. CBG: No results for input(s): "GLUCAP" in the last 168 hours. Lipid Profile: No results for input(s): "CHOL", "HDL", "LDLCALC", "TRIG", "CHOLHDL", "LDLDIRECT" in the last 72 hours. Thyroid  Function Tests: No results for input(s): "TSH", "T4TOTAL", "FREET4", "T3FREE", "THYROIDAB" in the last 72 hours. Anemia Panel: No results for input(s): "VITAMINB12", "FOLATE", "FERRITIN", "TIBC", "IRON", "RETICCTPCT" in the last 72 hours. Urine analysis:    Component Value Date/Time   COLORURINE YELLOW 05/21/2021 2128   APPEARANCEUR CLEAR 05/21/2021 2128   LABSPEC 1.010 05/21/2021 2128   PHURINE 7.0 05/21/2021 2128   GLUCOSEU NEGATIVE 05/21/2021 2128   HGBUR NEGATIVE 05/21/2021 2128   HGBUR small 07/21/2008 1538   BILIRUBINUR NEGATIVE 05/21/2021 2128   KETONESUR NEGATIVE 05/21/2021 2128   PROTEINUR NEGATIVE 05/21/2021 2128   UROBILINOGEN 0.2 04/23/2015 1851   NITRITE NEGATIVE 05/21/2021 2128   LEUKOCYTESUR TRACE (A) 05/21/2021 2128    Radiological Exams on Admission: DG Chest Portable 1 View Result Date: 05/20/2024 CLINICAL DATA:  Shortness of breath beginning about 30 minutes ago. Baseline oxygen use. Low oxygen saturation. EXAM: PORTABLE CHEST 1 VIEW COMPARISON:  04/29/2024 FINDINGS: Shallow inspiration. Cardiac enlargement. Mild pulmonary vascular congestion. Hazy infiltrates in the lungs likely indicating edema. Small bilateral pleural effusions. No  pneumothorax. Prominence of the left pulmonary outflow tract. Calcification of the aorta. Similar appearance to the previous study. IMPRESSION: Cardiac enlargement with pulmonary vascular congestion and mild pulmonary infiltrates, likely edema. Small bilateral pleural effusions. Electronically Signed   By: Boyce Byes M.D.   On: 05/20/2024 20:23   Data Reviewed for HPI: Relevant notes from primary care and specialist visits, past discharge summaries as available in EHR, including Care Everywhere. Prior diagnostic testing as pertinent to current admission diagnoses Updated medications and problem lists for reconciliation ED course, including vitals, labs, imaging, treatment and response to treatment Triage notes, nursing and pharmacy notes and ED provider's notes Notable results as noted above in HPI      Assessment and Plan: * Acute on chronic heart failure with preserved ejection fraction (HFpEF) (HCC) Acute on chronic respiratory failure with hypoxia and hypercapnia Continue BiPAP as tolerated IV Lasix  Continue home GDMT  Epistaxis Treated with Merisel in the ED Continue to monitor given ongoing anticoagulation with Xarelto   Chronic obstructive pulmonary disease (HCC) Not acutely exacerbated Continue home inhalers with DuoNebs  prn  History of pulmonary embolism Continue Xarelto   Atrial fibrillation (HCC) History of cardioversion on 04/22/2024 Currently in A-fib and rate controlled Continue Xarelto  and amiodarone and metoprolol follow  Obesity, Class III, BMI 40-49.9 (morbid obesity) Complicating factor to overall prognosis and care  Rheumatoid arthritis (HCC) Hold home immunosuppressants  Essential hypertension Continue home meds  Hypothyroidism Continue levothyroxine      DVT prophylaxis: Xarelto   Consults: none  Advance Care Planning:   Code Status: Full Code   Family Communication: none  Disposition Plan: Back to previous home  environment  Severity of Illness: The appropriate patient status for this patient is OBSERVATION. Observation status is judged to be  reasonable and necessary in order to provide the required intensity of service to ensure the patient's safety. The patient's presenting symptoms, physical exam findings, and initial radiographic and laboratory data in the context of their medical condition is felt to place them at decreased risk for further clinical deterioration. Furthermore, it is anticipated that the patient will be medically stable for discharge from the hospital within 2 midnights of admission.   Author: Lanetta Pion, MD 05/21/2024 2:04 AM  For on call review [email or web address].

## 2024-05-21 NOTE — Plan of Care (Signed)
  Problem: Coping: Goal: Ability to adjust to condition or change in health will improve Outcome: Progressing   Problem: Fluid Volume: Goal: Ability to maintain a balanced intake and output will improve Outcome: Progressing   Problem: Metabolic: Goal: Ability to maintain appropriate glucose levels will improve Outcome: Progressing   Problem: Nutritional: Goal: Maintenance of adequate nutrition will improve Outcome: Progressing   Problem: Skin Integrity: Goal: Risk for impaired skin integrity will decrease Outcome: Progressing   Problem: Tissue Perfusion: Goal: Adequacy of tissue perfusion will improve Outcome: Progressing   Problem: Education: Goal: Knowledge of General Education information will improve Description: Including pain rating scale, medication(s)/side effects and non-pharmacologic comfort measures Outcome: Progressing   Problem: Clinical Measurements: Goal: Ability to maintain clinical measurements within normal limits will improve Outcome: Progressing Goal: Will remain free from infection Outcome: Progressing Goal: Diagnostic test results will improve Outcome: Progressing Goal: Respiratory complications will improve Outcome: Progressing Goal: Cardiovascular complication will be avoided Outcome: Progressing   Problem: Activity: Goal: Risk for activity intolerance will decrease Outcome: Progressing   Problem: Nutrition: Goal: Adequate nutrition will be maintained Outcome: Progressing   Problem: Elimination: Goal: Will not experience complications related to bowel motility Outcome: Progressing Goal: Will not experience complications related to urinary retention Outcome: Progressing   Problem: Pain Managment: Goal: General experience of comfort will improve and/or be controlled Outcome: Progressing   Problem: Safety: Goal: Ability to remain free from injury will improve Outcome: Progressing   Problem: Skin Integrity: Goal: Risk for impaired  skin integrity will decrease Outcome: Progressing   Problem: Activity: Goal: Capacity to carry out activities will improve Outcome: Progressing   Problem: Cardiac: Goal: Ability to achieve and maintain adequate cardiopulmonary perfusion will improve Outcome: Progressing

## 2024-05-21 NOTE — Assessment & Plan Note (Signed)
 Hold home immunosuppressants

## 2024-05-21 NOTE — ED Notes (Signed)
 Trialing pt at this time on [place]. Pt maintaining in the lower 90's. Currently 91% on 5L [place].

## 2024-05-21 NOTE — ED Notes (Signed)
 This RN able to get pt of bipap. Pt normal baseline for [place] is 5-6L. Pt currently on 5L sating in the mid 90's. Currently 96% on 5L [place]. Pt given juice to correct BS. Pt refusing to eat at this time.

## 2024-05-21 NOTE — Assessment & Plan Note (Signed)
 Continue home meds

## 2024-05-21 NOTE — Evaluation (Signed)
 Physical Therapy Evaluation Patient Details Name: [name] MRN: [id number] DOB: Oct 23, 1956 Today's Date: 05/21/2024  History of Present Illness  [name] is a 68 y.o. female with medical history significant for COPD, rheumatoid arthritis, CHF, PE, T2DM, DVT, HTN, HLD, hypothyroidism, chronic respiratory failure on 6 L [place] and nighttime Bipap,  morbid obesity, nicotine dependence, atrial fibrillation s/p cardioversion on 04/22/2024, currently on amiodarone and Xarelto , recently hospitalized from 5/5 to 04/30/2024 at [facility] for multifactorial respiratory failure related to CHF and pneumonia (EF 55 to 60%),  presenting to the ED with shortness of breath that started about 30 minutes prior to arrival, with O2 sats in the high 70s on her home flow rate of 6 L.  Clinical Impression  Pt is a pleasant 68 year old female who was admitted for SOB. Pt performs bed mobility with supervision assist but rapidly desaturates to high to mid 80s with mobility. Pt was found at the beginning of session SaO2 on 4L at rest 83%, increased to 5L 92%, after bed mobility sat a 88% and ended  session with 92%. Pt demonstrates overall generalized weakness and activity intolerance and an anticipated need of assistance with transfers and ambulation. PT to continue assessing next session given pt medical stability improves.        If plan is discharge home, recommend the following: A lot of help with walking and/or transfers;A lot of help with bathing/dressing/bathroom;Assistance with cooking/housework;Assist for transportation;Help with stairs or ramp for entrance   Can travel by private vehicle   No    Equipment Recommendations None recommended by PT  Recommendations for Other Services       Functional Status Assessment Patient has had a recent decline in their functional status and/or demonstrates limited ability to make significant improvements in function in a reasonable and predictable amount of time      Precautions / Restrictions Precautions Precautions: None Restrictions Weight Bearing Restrictions Per Provider Order: No      Mobility  Bed Mobility Overal bed mobility: Needs Assistance Bed Mobility: Rolling Rolling: Supervision, Used rails         General bed mobility comments: pt verbally cued on using hand rails to facilitate movement   SaO2 on 4L at rest = 83% SaO2 on 5L in supine= 92% SaO2 after bed mobility= 88% SaO2 end of session= 92%  Transfers                   General transfer comment: unable to perform this date d/t pt fatigue    Ambulation/Gait               General Gait Details: unable to perform this date d/t pt fatigue  Stairs            Wheelchair Mobility     Tilt Bed    Modified Rankin (Stroke Patients Only)       Balance Overall balance assessment: History of Falls                                           Pertinent Vitals/Pain Pain Assessment Pain Assessment: No/denies pain    Home Living Family/patient expects to be discharged to:: Skilled nursing facility                   Additional Comments: pt from [facility], resided there for 2 days before admission  Prior Function Prior Level of Function : Needs assist       Physical Assist : Mobility (physical);ADLs (physical) Mobility (physical): Bed mobility;Transfers;Gait;Stairs ADLs (physical): Grooming;Dressing;Bathing;IADLs;Toileting Mobility Comments: pt states she uses RW for short distances and WC for longer distances, unable to self propel ADLs Comments: pt states she needs assistance with IADLs and showering the most but can get herself to the bathroom     Extremity/Trunk Assessment   Upper Extremity Assessment Upper Extremity Assessment: Generalized weakness    Lower Extremity Assessment Lower Extremity Assessment: Generalized weakness       Communication   Communication Communication: No apparent difficulties     Cognition   Behavior During Therapy: WFL for tasks assessed/performed   PT - Cognitive impairments: No apparent impairments                         Following commands: Intact       Cueing Cueing Techniques: Verbal cues, Tactile cues     General Comments General comments (skin integrity, edema, etc.): bed sores, had a sacral patch on    Exercises General Exercises - Lower Extremity Ankle Circles/Pumps: AROM, Right, Left, Supine Straight Leg Raises: AROM, Right, Left, Supine   Assessment/Plan    PT Assessment Patient needs continued PT services  PT Problem List Decreased strength;Decreased activity tolerance;Decreased balance;Decreased mobility       PT Treatment Interventions Gait training;Stair training;Functional mobility training;Therapeutic activities;Therapeutic exercise;Balance training    PT Goals (Current goals can be found in the Care Plan section)  Acute Rehab PT Goals Patient Stated Goal: to get better and wean off O2 PT Goal Formulation: With patient Time For Goal Achievement: 06/04/24 Potential to Achieve Goals: Fair    Frequency Min 2X/week     Co-evaluation               AM-PAC PT "6 Clicks" Mobility  Outcome Measure Help needed turning from your back to your side while in a flat bed without using bedrails?: A Little Help needed moving from lying on your back to sitting on the side of a flat bed without using bedrails?: A Lot Help needed moving to and from a bed to a chair (including a wheelchair)?: A Lot Help needed standing up from a chair using your arms (e.g., wheelchair or bedside chair)?: A Lot Help needed to walk in hospital room?: A Lot Help needed climbing 3-5 steps with a railing? : A Lot 6 Click Score: 13    End of Session   Activity Tolerance: Patient limited by fatigue Patient left: in bed;with call bell/phone within reach;with bed alarm set Nurse Communication: Mobility status PT Visit Diagnosis: Repeated falls  (R29.6);Muscle weakness (generalized) (M62.81);History of falling (Z91.81)    Time: 1521-1540 PT Time Calculation (min) (ACUTE ONLY): 19 min   Charges:   PT Evaluation $PT Eval Low Complexity: 1 Low   PT General Charges $$ ACUTE PT VISIT: 1 Visit         Knute Mazzuca Romero-Perozo, SPT  05/21/2024, 4:50 PM

## 2024-05-21 NOTE — Progress Notes (Signed)
 PROGRESS NOTE    LONDON NONAKA  ZOX:[id number] DOB: 1956/01/24 DOA: 05/20/2024 PCP: [name], PA-C     Brief Narrative:   From admission h and p  [name] is a 68 y.o. female with medical history significant for COPD, rheumatoid arthritis, CHF, PE, T2DM, DVT, HTN, HLD, hypothyroidism, chronic respiratory failure on 6 L [place] and nighttime Bipap,  morbid obesity, nicotine dependence, atrial fibrillation s/p cardioversion on 04/22/2024, currently on amiodarone and Xarelto , recently hospitalized from 5/5 to 04/30/2024 at [facility] for multifactorial respiratory failure related to CHF and pneumonia (EF 55 to 60%),  presenting to the ED with shortness of breath that started about 30 minutes prior to arrival, with O2 sats in the high 70s on her home flow rate of 6 L.  She states her BiPAP has been broken for the past 3 days.  Denies chest pain. ED course and data review: Vitals within normal limits. pH 7.31, PCO265 and PO268 CBC normal WBC, hemoglobin 9.1 and platelet 108.  Respiratory viral panel negative Troponin 6 and BNP 82   EKG with a flutter at 83   Chest x-ray with vascular congestion as follows: IMPRESSION: Cardiac enlargement with pulmonary vascular congestion and mild pulmonary infiltrates, likely edema. Small bilateral pleural effusions.     Patient treated with Lasix    Patient developed epistaxis while in the ED, bleeding controlled with Merocel   Was consulted for admission   Assessment & Plan:   Principal Problem:   Acute on chronic heart failure with preserved ejection fraction (HFpEF) (HCC) Active Problems:   Acute on chronic respiratory failure with hypoxia (HCC)   Epistaxis   Chronic obstructive pulmonary disease (HCC)   History of pulmonary embolism   Atrial fibrillation (HCC)   Chronic anticoagulation   Hypothyroidism   Essential hypertension   Rheumatoid arthritis (HCC)   Obesity, Class III, BMI 40-49.9 (morbid obesity)   OSA on CPAP   Type 2  diabetes mellitus without complication, without long-term current use of insulin (HCC)   History of DVT (deep vein thrombosis)  # Epistaxis Patient says this is primary reason for admission. Occurring in setting of increased use of nasal cannula, and DOAC. Packed in ER - will discuss w/ ENT  # Chronic hypoxic respiratory failure # OHS # OSA 2/2 broken bipap - continue bipap here - TOC consulted  # COPD No exacerbation - home trelegy  # A-fib Rate controlled - home dilt, amio, xarelto   # Hypothyroid - home synthroid   # HFpEF Appears compensated, bnp wnl - home lasix   # Neuropathy, chronic pain - home gabapentin, tramadol   # RA - home plaquenil, arava , prednisone   # T2DM - SSI     DVT prophylaxis: xarelto  Code Status: full Family Communication: none at bedside  Level of care: Progressive Status is: Observation    Consultants:  none  Procedures: none  Antimicrobials:  none    Subjective: Reports breathing at baseline on bipap.   Objective: Vitals:   05/21/24 0415 05/21/24 0430 05/21/24 0500 05/21/24 0700  BP:  119/76 120/72 118/70  Pulse: 72 69 72 71  Resp: 17 13 16 17   Temp:      TempSrc:      SpO2: 100% 98% 94% 93%  Weight:      Height:        Intake/Output Summary (Last 24 hours) at 05/21/2024 0949 Last data filed at 05/21/2024 0408 Gross per 24 hour  Intake --  Output 2200 ml  Net -2200 ml  Filed Weights   05/20/24 2001  Weight: 107 kg    Examination:  General exam: Appears calm and comfortable, chronically ill Respiratory system: Clear to auscultation. Respiratory effort normal. Cardiovascular system: S1 & S2 heard, irreg, distant Gastrointestinal system: Abdomen is obese, soft and nontender.   Central nervous system: Alert and oriented. No focal neurological deficits. Extremities: Symmetric 5 x 5 power. Skin: No visible rashes, lesions or ulcers Psychiatry: Judgement and insight appear normal. Mood & affect appropriate.      Data Reviewed: I have personally reviewed following labs and imaging studies  CBC: Recent Labs  Lab 05/20/24 2006 05/21/24 0500  WBC 9.2 8.2  NEUTROABS 7.9*  --   HGB 9.1* 9.0*  HCT 30.1* 29.4*  MCV 100.0 99.3  PLT 108* 100*   Basic Metabolic Panel: Recent Labs  Lab 05/20/24 2006 05/21/24 0500  NA 139 140  K 4.6 3.6  CL 105 103  CO2 28 30  GLUCOSE 112* 77  BUN 21 28*  CREATININE 0.75 0.76  CALCIUM  8.3* 8.1*   GFR: Estimated Creatinine Clearance: 79.6 mL/min (by C-G formula based on SCr of 0.76 mg/dL). Liver Function Tests: Recent Labs  Lab 05/20/24 2006  AST 12*  ALT 14  ALKPHOS 55  BILITOT 0.6  PROT 6.0*  ALBUMIN 2.8*   No results for input(s): "LIPASE", "AMYLASE" in the last 168 hours. No results for input(s): "AMMONIA" in the last 168 hours. Coagulation Profile: Recent Labs  Lab 05/20/24 2006  INR 2.1*   Cardiac Enzymes: No results for input(s): "CKTOTAL", "CKMB", "CKMBINDEX", "TROPONINI" in the last 168 hours. BNP (last 3 results) No results for input(s): "PROBNP" in the last 8760 hours. HbA1C: No results for input(s): "HGBA1C" in the last 72 hours. CBG: No results for input(s): "GLUCAP" in the last 168 hours. Lipid Profile: No results for input(s): "CHOL", "HDL", "LDLCALC", "TRIG", "CHOLHDL", "LDLDIRECT" in the last 72 hours. Thyroid  Function Tests: No results for input(s): "TSH", "T4TOTAL", "FREET4", "T3FREE", "THYROIDAB" in the last 72 hours. Anemia Panel: No results for input(s): "VITAMINB12", "FOLATE", "FERRITIN", "TIBC", "IRON", "RETICCTPCT" in the last 72 hours. Urine analysis:    Component Value Date/Time   COLORURINE YELLOW 05/21/2021 2128   APPEARANCEUR CLEAR 05/21/2021 2128   LABSPEC 1.010 05/21/2021 2128   PHURINE 7.0 05/21/2021 2128   GLUCOSEU NEGATIVE 05/21/2021 2128   HGBUR NEGATIVE 05/21/2021 2128   HGBUR small 07/21/2008 1538   BILIRUBINUR NEGATIVE 05/21/2021 2128   KETONESUR NEGATIVE 05/21/2021 2128   PROTEINUR  NEGATIVE 05/21/2021 2128   UROBILINOGEN 0.2 04/23/2015 1851   NITRITE NEGATIVE 05/21/2021 2128   LEUKOCYTESUR TRACE (A) 05/21/2021 2128   Sepsis Labs: @LABRCNTIP (procalcitonin:4,lacticidven:4)  ) Recent Results (from the past 240 hours)  Resp panel by RT-PCR (RSV, Flu A&B, Covid) Anterior Nasal Swab     Status: None   Collection Time: 05/20/24  8:06 PM   Specimen: Anterior Nasal Swab  Result Value Ref Range Status   SARS Coronavirus 2 by RT PCR NEGATIVE NEGATIVE Final    Comment: (NOTE) SARS-CoV-2 target nucleic acids are NOT DETECTED.  The SARS-CoV-2 RNA is generally detectable in upper respiratory specimens during the acute phase of infection. The lowest concentration of SARS-CoV-2 viral copies this assay can detect is 138 copies/mL. A negative result does not preclude SARS-Cov-2 infection and should not be used as the sole basis for treatment or other patient management decisions. A negative result may occur with  improper specimen collection/handling, submission of specimen other than nasopharyngeal swab, presence of viral mutation(s)  within the areas targeted by this assay, and inadequate number of viral copies(<138 copies/mL). A negative result must be combined with clinical observations, patient history, and epidemiological information. The expected result is Negative.  Fact Sheet for Patients:  BloggerCourse.com  Fact Sheet for Healthcare Providers:  SeriousBroker.it  This test is no t yet approved or cleared by the [place]  FDA and  has been authorized for detection and/or diagnosis of SARS-CoV-2 by FDA under an Emergency Use Authorization (EUA). This EUA will remain  in effect (meaning this test can be used) for the duration of the COVID-19 declaration under Section 564(b)(1) of the Act, 21 U.S.C.section 360bbb-3(b)(1), unless the authorization is terminated  or revoked sooner.       Influenza A by PCR  NEGATIVE NEGATIVE Final   Influenza B by PCR NEGATIVE NEGATIVE Final    Comment: (NOTE) The Xpert Xpress SARS-CoV-2/FLU/RSV plus assay is intended as an aid in the diagnosis of influenza from Nasopharyngeal swab specimens and should not be used as a sole basis for treatment. Nasal washings and aspirates are unacceptable for Xpert Xpress SARS-CoV-2/FLU/RSV testing.  Fact Sheet for Patients: BloggerCourse.com  Fact Sheet for Healthcare Providers: SeriousBroker.it  This test is not yet approved or cleared by the [place]  FDA and has been authorized for detection and/or diagnosis of SARS-CoV-2 by FDA under an Emergency Use Authorization (EUA). This EUA will remain in effect (meaning this test can be used) for the duration of the COVID-19 declaration under Section 564(b)(1) of the Act, 21 U.S.C. section 360bbb-3(b)(1), unless the authorization is terminated or revoked.     Resp Syncytial Virus by PCR NEGATIVE NEGATIVE Final    Comment: (NOTE) Fact Sheet for Patients: BloggerCourse.com  Fact Sheet for Healthcare Providers: SeriousBroker.it  This test is not yet approved or cleared by the [place]  FDA and has been authorized for detection and/or diagnosis of SARS-CoV-2 by FDA under an Emergency Use Authorization (EUA). This EUA will remain in effect (meaning this test can be used) for the duration of the COVID-19 declaration under Section 564(b)(1) of the Act, 21 U.S.C. section 360bbb-3(b)(1), unless the authorization is terminated or revoked.  Performed at [facility], [address] [place], [place] [address]          Radiology Studies: DG Chest Portable 1 View Result Date: 05/20/2024 CLINICAL DATA:  Shortness of breath beginning about 30 minutes ago. Baseline oxygen use. Low oxygen saturation. EXAM: PORTABLE CHEST 1 VIEW COMPARISON:  04/29/2024  FINDINGS: Shallow inspiration. Cardiac enlargement. Mild pulmonary vascular congestion. Hazy infiltrates in the lungs likely indicating edema. Small bilateral pleural effusions. No pneumothorax. Prominence of the left pulmonary outflow tract. Calcification of the aorta. Similar appearance to the previous study. IMPRESSION: Cardiac enlargement with pulmonary vascular congestion and mild pulmonary infiltrates, likely edema. Small bilateral pleural effusions. Electronically Signed   By: Boyce Byes M.D.   On: 05/20/2024 20:23        Scheduled Meds:  amiodarone  200 mg Oral Daily   arformoterol  15 mcg Nebulization BID   budesonide  0.5 mg Nebulization BID   diltiazem  360 mg Oral Daily   furosemide   40 mg Intravenous BID   gabapentin  100 mg Oral TID   levothyroxine   150 mcg Oral Q0600   melatonin  5 mg Oral QHS   revefenacin  175 mcg Inhalation Daily   rivaroxaban   20 mg Oral Q supper   simvastatin   10 mg Oral q1800   traZODone  50 mg  Oral QHS   Continuous Infusions:   LOS: 0 days     Raymonde Calico, MD [facility]   If 7PM-7AM, please contact night-coverage [email or web address] Password [facility] 05/21/2024, 9:49 AM

## 2024-05-21 NOTE — Assessment & Plan Note (Signed)
 Treated with Merisel in the ED Continue to monitor given ongoing anticoagulation with Xarelto 

## 2024-05-21 NOTE — Assessment & Plan Note (Signed)
 Complicating factor to overall prognosis and care

## 2024-05-21 NOTE — Assessment & Plan Note (Signed)
 History of cardioversion on 04/22/2024 Currently in A-fib and rate controlled Continue Xarelto  and amiodarone and metoprolol follow

## 2024-05-22 DIAGNOSIS — I5033 Acute on chronic diastolic (congestive) heart failure: Secondary | ICD-10-CM | POA: Diagnosis not present

## 2024-05-22 LAB — BASIC METABOLIC PANEL WITH GFR
Anion gap: 8 (ref 5–15)
BUN: 24 mg/dL — ABNORMAL HIGH (ref 8–23)
CO2: 36 mmol/L — ABNORMAL HIGH (ref 22–32)
Calcium: 8.1 mg/dL — ABNORMAL LOW (ref 8.9–10.3)
Chloride: 96 mmol/L — ABNORMAL LOW (ref 98–111)
Creatinine, Ser: 0.86 mg/dL (ref 0.44–1.00)
GFR, Estimated: >60 mL/min (ref >60)
Glucose, Bld: 81 mg/dL (ref 70–99)
Potassium: 3.4 mmol/L — ABNORMAL LOW (ref 3.5–5.1)
Sodium: 140 mmol/L (ref 135–145)

## 2024-05-22 LAB — GLUCOSE, CAPILLARY
Glucose-Capillary: 83 mg/dL (ref 70–99)
Glucose-Capillary: 129 mg/dL — ABNORMAL HIGH (ref 70–99)
Glucose-Capillary: 172 mg/dL — ABNORMAL HIGH (ref 70–99)
Glucose-Capillary: 125 mg/dL — ABNORMAL HIGH (ref 70–99)

## 2024-05-22 MED ORDER — POTASSIUM CHLORIDE CRYS ER 20 MEQ PO TBCR
40.0000 meq | EXTENDED_RELEASE_TABLET | Freq: Once | ORAL | Status: AC
  Administered 2024-05-22: 40 meq via ORAL
  Filled 2024-05-22: qty 2

## 2024-05-22 MED ORDER — CLINDAMYCIN HCL 150 MG PO CAPS
450.0000 mg | ORAL_CAPSULE | Freq: Three times a day (TID) | ORAL | Status: DC
  Administered 2024-05-22: 450 mg via ORAL
  Filled 2024-05-22: qty 3

## 2024-05-22 NOTE — Progress Notes (Signed)
 PROGRESS NOTE    [name]  WUJ:[id number] DOB: 1956/06/17 DOA: 05/20/2024 PCP: [name], PA-C     Brief Narrative:   From admission h and p  [name] is a 68 y.o. female with medical history significant for COPD, rheumatoid arthritis, CHF, PE, T2DM, DVT, HTN, HLD, hypothyroidism, chronic respiratory failure on 6 L [place] and nighttime Bipap,  morbid obesity, nicotine dependence, atrial fibrillation s/p cardioversion on 04/22/2024, currently on amiodarone  and Xarelto , recently hospitalized from 5/5 to 04/30/2024 at [facility] for multifactorial respiratory failure related to CHF and pneumonia (EF 55 to 60%),  presenting to the ED with shortness of breath that started about 30 minutes prior to arrival, with O2 sats in the high 70s on her home flow rate of 6 L.  She states her BiPAP has been broken for the past 3 days.  Denies chest pain. ED course and data review: Vitals within normal limits. pH 7.31, PCO265 and PO268 CBC normal WBC, hemoglobin 9.1 and platelet 108.  Respiratory viral panel negative Troponin 6 and BNP 82   EKG with a flutter at 83   Chest x-ray with vascular congestion as follows: IMPRESSION: Cardiac enlargement with pulmonary vascular congestion and mild pulmonary infiltrates, likely edema. Small bilateral pleural effusions.     Patient treated with Lasix    Patient developed epistaxis while in the ED, bleeding controlled with Merocel   Was consulted for admission   Assessment & Plan:   Principal Problem:   Acute on chronic heart failure with preserved ejection fraction (HFpEF) (HCC) Active Problems:   Acute on chronic respiratory failure with hypoxia (HCC)   Epistaxis   Chronic obstructive pulmonary disease (HCC)   History of pulmonary embolism   Atrial fibrillation (HCC)   Chronic anticoagulation   Hypothyroidism   Essential hypertension   Rheumatoid arthritis (HCC)   Obesity, Class III, BMI 40-49.9 (morbid obesity)   OSA on CPAP   Type 2  diabetes mellitus without complication, without long-term current use of insulin  (HCC)   History of DVT (deep vein thrombosis)   CHF exacerbation (HCC)  # Epistaxis Patient says this is primary reason for admission. Occurring in setting of increased use of nasal cannula, and DOAC. Packed in ER, since removed, no recurrence - afrin should this resume  # Chronic hypoxic respiratory failure # OHS # OSA 2/2 broken bipap, snf says a new unit has been delivered - continue bipap here qhs - TOC says snf can take her back on Monday, not this weekend  # COPD No exacerbation - home trelegy  # A-fib Rate controlled - home dilt, amio, xarelto   # Hypothyroid - home synthroid   # HFpEF Appears compensated, bnp wnl - home lasix   # Neuropathy, chronic pain - home gabapentin , tramadol   # RA - home plaquenil , arava , prednisone   # T2DM - SSI     DVT prophylaxis: xarelto  Code Status: full Family Communication: none at bedside  Level of care: Progressive Status is: inpt    Consultants:  none  Procedures: none  Antimicrobials:  none    Subjective: Reports breathing is at baseline.  Objective: Vitals:   05/22/24 0500 05/22/24 0805 05/22/24 0823 05/22/24 1154  BP:  113/68  110/65  Pulse:  75  87  Resp:  18  16  Temp:  98.6 F (37 C)  99.2 F (37.3 C)  TempSrc:  Axillary  Oral  SpO2:  95% 95% 99%  Weight: 87.8 kg     Height:  Intake/Output Summary (Last 24 hours) at 05/22/2024 1510 Last data filed at 05/22/2024 1300 Gross per 24 hour  Intake 600 ml  Output 500 ml  Net 100 ml   Filed Weights   05/20/24 2001 05/22/24 0500  Weight: 107 kg 87.8 kg    Examination:  General exam: Appears calm and comfortable, chronically ill Respiratory system: Clear to auscultation. Respiratory effort normal. Cardiovascular system: S1 & S2 heard, irreg, distant Gastrointestinal system: Abdomen is obese, soft and nontender.   Central nervous system: Alert and  oriented. No focal neurological deficits. Extremities: Symmetric 5 x 5 power. Skin: venous stasis changes Psychiatry: Judgement and insight appear normal. Mood & affect appropriate.     Data Reviewed: I have personally reviewed following labs and imaging studies  CBC: Recent Labs  Lab 05/20/24 2006 05/21/24 0500  WBC 9.2 8.2  NEUTROABS 7.9*  --   HGB 9.1* 9.0*  HCT 30.1* 29.4*  MCV 100.0 99.3  PLT 108* 100*   Basic Metabolic Panel: Recent Labs  Lab 05/20/24 2006 05/21/24 0500 05/22/24 0429  NA 139 140 140  K 4.6 3.6 3.4*  CL 105 103 96*  CO2 28 30 36*  GLUCOSE 112* 77 81  BUN 21 28* 24*  CREATININE 0.75 0.76 0.86  CALCIUM  8.3* 8.1* 8.1*   GFR: Estimated Creatinine Clearance: 66.2 mL/min (by C-G formula based on SCr of 0.86 mg/dL). Liver Function Tests: Recent Labs  Lab 05/20/24 2006  AST 12*  ALT 14  ALKPHOS 55  BILITOT 0.6  PROT 6.0*  ALBUMIN 2.8*   No results for input(s): "LIPASE", "AMYLASE" in the last 168 hours. No results for input(s): "AMMONIA" in the last 168 hours. Coagulation Profile: Recent Labs  Lab 05/20/24 2006  INR 2.1*   Cardiac Enzymes: No results for input(s): "CKTOTAL", "CKMB", "CKMBINDEX", "TROPONINI" in the last 168 hours. BNP (last 3 results) No results for input(s): "PROBNP" in the last 8760 hours. HbA1C: No results for input(s): "HGBA1C" in the last 72 hours. CBG: Recent Labs  Lab 05/21/24 1416 05/21/24 1645 05/21/24 2127 05/22/24 0807 05/22/24 1153  GLUCAP 89 74 102* 83 129*   Lipid Profile: No results for input(s): "CHOL", "HDL", "LDLCALC", "TRIG", "CHOLHDL", "LDLDIRECT" in the last 72 hours. Thyroid  Function Tests: No results for input(s): "TSH", "T4TOTAL", "FREET4", "T3FREE", "THYROIDAB" in the last 72 hours. Anemia Panel: No results for input(s): "VITAMINB12", "FOLATE", "FERRITIN", "TIBC", "IRON", "RETICCTPCT" in the last 72 hours. Urine analysis:    Component Value Date/Time   COLORURINE YELLOW 05/21/2021  2128   APPEARANCEUR CLEAR 05/21/2021 2128   LABSPEC 1.010 05/21/2021 2128   PHURINE 7.0 05/21/2021 2128   GLUCOSEU NEGATIVE 05/21/2021 2128   HGBUR NEGATIVE 05/21/2021 2128   HGBUR small 07/21/2008 1538   BILIRUBINUR NEGATIVE 05/21/2021 2128   KETONESUR NEGATIVE 05/21/2021 2128   PROTEINUR NEGATIVE 05/21/2021 2128   UROBILINOGEN 0.2 04/23/2015 1851   NITRITE NEGATIVE 05/21/2021 2128   LEUKOCYTESUR TRACE (A) 05/21/2021 2128   Sepsis Labs: @LABRCNTIP (procalcitonin:4,lacticidven:4)  ) Recent Results (from the past 240 hours)  Resp panel by RT-PCR (RSV, Flu A&B, Covid) Anterior Nasal Swab     Status: None   Collection Time: 05/20/24  8:06 PM   Specimen: Anterior Nasal Swab  Result Value Ref Range Status   SARS Coronavirus 2 by RT PCR NEGATIVE NEGATIVE Final    Comment: (NOTE) SARS-CoV-2 target nucleic acids are NOT DETECTED.  The SARS-CoV-2 RNA is generally detectable in upper respiratory specimens during the acute phase of infection. The lowest concentration  of SARS-CoV-2 viral copies this assay can detect is 138 copies/mL. A negative result does not preclude SARS-Cov-2 infection and should not be used as the sole basis for treatment or other patient management decisions. A negative result may occur with  improper specimen collection/handling, submission of specimen other than nasopharyngeal swab, presence of viral mutation(s) within the areas targeted by this assay, and inadequate number of viral copies(<138 copies/mL). A negative result must be combined with clinical observations, patient history, and epidemiological information. The expected result is Negative.  Fact Sheet for Patients:  BloggerCourse.com  Fact Sheet for Healthcare Providers:  SeriousBroker.it  This test is no t yet approved or cleared by the [place]  FDA and  has been authorized for detection and/or diagnosis of SARS-CoV-2 by FDA under an Emergency  Use Authorization (EUA). This EUA will remain  in effect (meaning this test can be used) for the duration of the COVID-19 declaration under Section 564(b)(1) of the Act, 21 U.S.C.section 360bbb-3(b)(1), unless the authorization is terminated  or revoked sooner.       Influenza A by PCR NEGATIVE NEGATIVE Final   Influenza B by PCR NEGATIVE NEGATIVE Final    Comment: (NOTE) The Xpert Xpress SARS-CoV-2/FLU/RSV plus assay is intended as an aid in the diagnosis of influenza from Nasopharyngeal swab specimens and should not be used as a sole basis for treatment. Nasal washings and aspirates are unacceptable for Xpert Xpress SARS-CoV-2/FLU/RSV testing.  Fact Sheet for Patients: BloggerCourse.com  Fact Sheet for Healthcare Providers: SeriousBroker.it  This test is not yet approved or cleared by the [place]  FDA and has been authorized for detection and/or diagnosis of SARS-CoV-2 by FDA under an Emergency Use Authorization (EUA). This EUA will remain in effect (meaning this test can be used) for the duration of the COVID-19 declaration under Section 564(b)(1) of the Act, 21 U.S.C. section 360bbb-3(b)(1), unless the authorization is terminated or revoked.     Resp Syncytial Virus by PCR NEGATIVE NEGATIVE Final    Comment: (NOTE) Fact Sheet for Patients: BloggerCourse.com  Fact Sheet for Healthcare Providers: SeriousBroker.it  This test is not yet approved or cleared by the [place]  FDA and has been authorized for detection and/or diagnosis of SARS-CoV-2 by FDA under an Emergency Use Authorization (EUA). This EUA will remain in effect (meaning this test can be used) for the duration of the COVID-19 declaration under Section 564(b)(1) of the Act, 21 U.S.C. section 360bbb-3(b)(1), unless the authorization is terminated or revoked.  Performed at [facility], [address], [place], [place] [address]          Radiology Studies: DG Chest Portable 1 View Result Date: 05/20/2024 CLINICAL DATA:  Shortness of breath beginning about 30 minutes ago. Baseline oxygen use. Low oxygen saturation. EXAM: PORTABLE CHEST 1 VIEW COMPARISON:  04/29/2024 FINDINGS: Shallow inspiration. Cardiac enlargement. Mild pulmonary vascular congestion. Hazy infiltrates in the lungs likely indicating edema. Small bilateral pleural effusions. No pneumothorax. Prominence of the left pulmonary outflow tract. Calcification of the aorta. Similar appearance to the previous study. IMPRESSION: Cardiac enlargement with pulmonary vascular congestion and mild pulmonary infiltrates, likely edema. Small bilateral pleural effusions. Electronically Signed   By: Boyce Byes M.D.   On: 05/20/2024 20:23        Scheduled Meds:  amiodarone   200 mg Oral Daily   arformoterol   15 mcg Nebulization BID   budesonide   0.5 mg Nebulization BID   diltiazem   360 mg Oral Daily   furosemide   40 mg Intravenous  BID   gabapentin   400 mg Oral TID   hydroxychloroquine   200 mg Oral q AM   insulin  aspart  0-15 Units Subcutaneous TID WC   insulin  aspart  0-5 Units Subcutaneous QHS   levothyroxine   150 mcg Oral Q0600   melatonin  5 mg Oral QHS   predniSONE   5 mg Oral Daily   revefenacin   175 mcg Inhalation Daily   rivaroxaban   20 mg Oral Q supper   simvastatin   10 mg Oral q1800   traZODone   50 mg Oral QHS   Continuous Infusions:   LOS: 1 day     Raymonde Calico, MD [facility]   If 7PM-7AM, please contact night-coverage [email or web address] Password [email or web address] 05/22/2024, 3:10 PM

## 2024-05-22 NOTE — TOC Progression Note (Signed)
 Transition of Care [facility]) - Progression Note    Patient Details  Name: [name] MRN: [id number] Date of Birth: 03-31-1956  [facility]) CM/SW Contact  Alexandra Ice, RN Phone Number: 05/22/2024, 10:00 AM  Clinical Narrative:      Sent message to [facility] to see if they are able to accept patient back today, awaiting response      Expected Discharge Plan and Services                                               Social Determinants of Health (SDOH) Interventions SDOH Screenings   Food Insecurity: No Food Insecurity (05/21/2024)  Housing: Low Risk  (05/21/2024)  Transportation Needs: No Transportation Needs (05/21/2024)  Utilities: Not At Risk (05/21/2024)  Social Connections: Socially Isolated (05/21/2024)  Tobacco Use: Medium Risk (05/20/2024)    Readmission Risk Interventions     No data to display

## 2024-05-22 NOTE — Plan of Care (Signed)
   Problem: Fluid Volume: Goal: Ability to maintain a balanced intake and output will improve Outcome: Progressing   Problem: Health Behavior/Discharge Planning: Goal: Ability to identify and utilize available resources and services will improve Outcome: Progressing

## 2024-05-23 DIAGNOSIS — I5033 Acute on chronic diastolic (congestive) heart failure: Secondary | ICD-10-CM | POA: Diagnosis not present

## 2024-05-23 LAB — GLUCOSE, CAPILLARY
Glucose-Capillary: 95 mg/dL (ref 70–99)
Glucose-Capillary: 133 mg/dL — ABNORMAL HIGH (ref 70–99)
Glucose-Capillary: 153 mg/dL — ABNORMAL HIGH (ref 70–99)
Glucose-Capillary: 129 mg/dL — ABNORMAL HIGH (ref 70–99)

## 2024-05-23 LAB — BASIC METABOLIC PANEL WITH GFR
Anion gap: 10 (ref 5–15)
BUN: 22 mg/dL (ref 8–23)
CO2: 34 mmol/L — ABNORMAL HIGH (ref 22–32)
Calcium: 8.4 mg/dL — ABNORMAL LOW (ref 8.9–10.3)
Chloride: 94 mmol/L — ABNORMAL LOW (ref 98–111)
Creatinine, Ser: 0.98 mg/dL (ref 0.44–1.00)
GFR, Estimated: >60 mL/min (ref >60)
Glucose, Bld: 91 mg/dL (ref 70–99)
Potassium: 3.5 mmol/L (ref 3.5–5.1)
Sodium: 138 mmol/L (ref 135–145)

## 2024-05-23 LAB — CBC
HCT: 26.9 % — ABNORMAL LOW (ref 36.0–46.0)
Hemoglobin: 8.4 g/dL — ABNORMAL LOW (ref 12.0–15.0)
MCH: 30.2 pg (ref 26.0–34.0)
MCHC: 31.2 g/dL (ref 30.0–36.0)
MCV: 96.8 fL (ref 80.0–100.0)
Platelets: 96 10*3/uL — ABNORMAL LOW (ref 150–400)
RBC: 2.78 MIL/uL — ABNORMAL LOW (ref 3.87–5.11)
RDW: 15.9 % — ABNORMAL HIGH (ref 11.5–15.5)
WBC: 7.2 10*3/uL (ref 4.0–10.5)
nRBC: 0.3 % — ABNORMAL HIGH (ref 0.0–0.2)

## 2024-05-23 MED ORDER — FUROSEMIDE 40 MG PO TABS: 80.0000 mg | ORAL_TABLET | Freq: Two times a day (BID) | ORAL | Status: DC

## 2024-05-23 MED ORDER — POTASSIUM CHLORIDE CRYS ER 20 MEQ PO TBCR
40.0000 meq | EXTENDED_RELEASE_TABLET | Freq: Once | ORAL | Status: AC
  Administered 2024-05-23: 40 meq via ORAL
  Filled 2024-05-23: qty 2

## 2024-05-23 MED ORDER — PREDNISONE 10 MG PO TABS
5.0000 mg | ORAL_TABLET | Freq: Every day | ORAL | Status: DC
  Administered 2024-05-24: 5 mg via ORAL
  Filled 2024-05-23: qty 1

## 2024-05-23 MED ORDER — SERTRALINE HCL 50 MG PO TABS
25.0000 mg | ORAL_TABLET | Freq: Every day | ORAL | Status: DC
  Administered 2024-05-23 – 2024-05-24 (×2): 25 mg via ORAL
  Filled 2024-05-23 (×2): qty 1

## 2024-05-23 MED ORDER — FUROSEMIDE 40 MG PO TABS
80.0000 mg | ORAL_TABLET | Freq: Two times a day (BID) | ORAL | Status: DC
  Administered 2024-05-23 – 2024-05-24 (×3): 80 mg via ORAL
  Filled 2024-05-23 (×3): qty 2

## 2024-05-23 MED ORDER — OXYMETAZOLINE HCL 0.05 % NA SOLN
1.0000 | Freq: Two times a day (BID) | NASAL | Status: DC
  Administered 2024-05-23 – 2024-05-24 (×2): 1 via NASAL
  Filled 2024-05-23: qty 15

## 2024-05-23 NOTE — Progress Notes (Signed)
       CROSS COVER NOTE  NAME: [name] MRN: [id number] DOB : 01/25/1956    Concern as stated by nurse / staff   Patient just coughed up bright red blood with a clot while getting her neb treatment. Patient denies pain or any other symptoms. BP 104/52 (68), HR 83, RR 13, O2Sat 95% on 5LNC.      Pertinent findings on chart review: Last progress note reviewed: Patient currently being treated for epistaxis, "Occurring in setting of increased use of nasal cannula, and DOAC. Packed in ER, since removed, no recurrence- afrin should this resume "      Latest Ref Rng & Units 05/21/2024    5:00 AM 05/20/2024    8:06 PM 05/21/2021    9:28 PM  CBC  WBC 4.0 - 10.5 K/uL 8.2  9.2  7.6   Hemoglobin 12.0 - 15.0 g/dL 9.0  9.1  13.0   Hematocrit 36.0 - 46.0 % 29.4  30.1  40.2   Platelets 150 - 400 K/uL 100  108  202       Patient Assessment    05/23/2024    7:24 PM 05/23/2024    4:34 PM 05/23/2024   12:08 PM  Vitals with BMI  Systolic 110 106 865  Diastolic 57 71 60  Pulse 80 82 79     Assessment and  Interventions   Assessment:  Coughing up blood-suspect swallowed blood from possible posterior nosebleed in view of recent epistaxis Recent epistaxis Chronic anticoagulation with Xarelto   Plan: Continue to monitor closely for respiratory symptoms Trial of Afrin If recurrence, will hold Xarelto  and do serial H&H   As of 4:15 AM: No further episodes

## 2024-05-23 NOTE — Progress Notes (Addendum)
 PROGRESS NOTE    [name]  ZOX:[id number] DOB: 04-19-1956 DOA: 05/20/2024 PCP: [name], PA-C     Brief Narrative:   From admission h and p  [name] is a 68 y.o. female with medical history significant for COPD, rheumatoid arthritis, CHF, PE, T2DM, DVT, HTN, HLD, hypothyroidism, chronic respiratory failure on 6 L [place] and nighttime Bipap,  morbid obesity, nicotine dependence, atrial fibrillation s/p cardioversion on 04/22/2024, currently on amiodarone  and Xarelto , recently hospitalized from 5/5 to 04/30/2024 at [facility] for multifactorial respiratory failure related to CHF and pneumonia (EF 55 to 60%),  presenting to the ED with shortness of breath that started about 30 minutes prior to arrival, with O2 sats in the high 70s on her home flow rate of 6 L.  She states her BiPAP has been broken for the past 3 days.  Denies chest pain. ED course and data review: Vitals within normal limits. pH 7.31, PCO265 and PO268 CBC normal WBC, hemoglobin 9.1 and platelet 108.  Respiratory viral panel negative Troponin 6 and BNP 82   EKG with a flutter at 83   Chest x-ray with vascular congestion as follows: IMPRESSION: Cardiac enlargement with pulmonary vascular congestion and mild pulmonary infiltrates, likely edema. Small bilateral pleural effusions.     Patient treated with Lasix    Patient developed epistaxis while in the ED, bleeding controlled with Merocel   Was consulted for admission   Assessment & Plan:   Principal Problem:   Acute on chronic heart failure with preserved ejection fraction (HFpEF) (HCC) Active Problems:   Acute on chronic respiratory failure with hypoxia (HCC)   Epistaxis   Chronic obstructive pulmonary disease (HCC)   History of pulmonary embolism   Atrial fibrillation (HCC)   Chronic anticoagulation   Hypothyroidism   Essential hypertension   Rheumatoid arthritis (HCC)   Obesity, Class III, BMI 40-49.9 (morbid obesity)   OSA on CPAP   Type 2  diabetes mellitus without complication, without long-term current use of insulin  (HCC)   History of DVT (deep vein thrombosis)   CHF exacerbation (HCC)  # Epistaxis Patient says this is primary reason for admission. Occurring in setting of increased use of nasal cannula, and DOAC. Packed in ER, since removed, no recurrence - afrin should this resume  # Chronic hypoxic respiratory failure # OHS # OSA 2/2 broken bipap, snf says a new unit has been delivered - continue bipap here qhs - TOC says snf can take her back on Monday, not this weekend  # COPD No exacerbation - home trelegy  # A-fib Rate controlled - home dilt, amio, xarelto   # Hypothyroid - home synthroid   # HFpEF Appears compensated, bnp wnl - resume home lasix  after pharmacy does med rec  # Neuropathy, chronic pain - home gabapentin , tramadol   # RA - home plaquenil , arava , prednisone   # T2DM - SSI     DVT prophylaxis: xarelto  Code Status: full Family Communication: none at bedside  Level of care: Progressive Status is: inpt    Consultants:  none  Procedures: none  Antimicrobials:  none    Subjective: Reports breathing is at baseline. No nosebleeds. Tolerating lunch.   Objective: Vitals:   05/23/24 0500 05/23/24 0756 05/23/24 0757 05/23/24 1208  BP:  115/67  105/60  Pulse:  77  79  Resp:    17  Temp:  98 F (36.7 C)  98.6 F (37 C)  TempSrc:    Oral  SpO2:  100% 100% 98%  Weight: 102.4 kg     Height:        Intake/Output Summary (Last 24 hours) at 05/23/2024 1255 Last data filed at 05/23/2024 1244 Gross per 24 hour  Intake 480 ml  Output 3500 ml  Net -3020 ml   Filed Weights   05/20/24 2001 05/22/24 0500 05/23/24 0500  Weight: 107 kg 87.8 kg 102.4 kg    Examination:  General exam: Appears calm and comfortable, chronically ill Respiratory system: Clear to auscultation. Respiratory effort normal. Cardiovascular system: S1 & S2 heard, irreg, distant Gastrointestinal  system: Abdomen is obese, soft and nontender.   Central nervous system: Alert and oriented. No focal neurological deficits. Extremities: Symmetric 5 x 5 power. Skin: venous stasis changes Psychiatry: Judgement and insight appear normal. Mood & affect appropriate.     Data Reviewed: I have personally reviewed following labs and imaging studies  CBC: Recent Labs  Lab 05/20/24 2006 05/21/24 0500  WBC 9.2 8.2  NEUTROABS 7.9*  --   HGB 9.1* 9.0*  HCT 30.1* 29.4*  MCV 100.0 99.3  PLT 108* 100*   Basic Metabolic Panel: Recent Labs  Lab 05/20/24 2006 05/21/24 0500 05/22/24 0429 05/23/24 0448  NA 139 140 140 138  K 4.6 3.6 3.4* 3.5  CL 105 103 96* 94*  CO2 28 30 36* 34*  GLUCOSE 112* 77 81 91  BUN 21 28* 24* 22  CREATININE 0.75 0.76 0.86 0.98  CALCIUM  8.3* 8.1* 8.1* 8.4*   GFR: Estimated Creatinine Clearance: 63.3 mL/min (by C-G formula based on SCr of 0.98 mg/dL). Liver Function Tests: Recent Labs  Lab 05/20/24 2006  AST 12*  ALT 14  ALKPHOS 55  BILITOT 0.6  PROT 6.0*  ALBUMIN 2.8*   No results for input(s): "LIPASE", "AMYLASE" in the last 168 hours. No results for input(s): "AMMONIA" in the last 168 hours. Coagulation Profile: Recent Labs  Lab 05/20/24 2006  INR 2.1*   Cardiac Enzymes: No results for input(s): "CKTOTAL", "CKMB", "CKMBINDEX", "TROPONINI" in the last 168 hours. BNP (last 3 results) No results for input(s): "PROBNP" in the last 8760 hours. HbA1C: No results for input(s): "HGBA1C" in the last 72 hours. CBG: Recent Labs  Lab 05/22/24 1153 05/22/24 1646 05/22/24 2128 05/23/24 0754 05/23/24 1204  GLUCAP 129* 172* 125* 95 133*   Lipid Profile: No results for input(s): "CHOL", "HDL", "LDLCALC", "TRIG", "CHOLHDL", "LDLDIRECT" in the last 72 hours. Thyroid  Function Tests: No results for input(s): "TSH", "T4TOTAL", "FREET4", "T3FREE", "THYROIDAB" in the last 72 hours. Anemia Panel: No results for input(s): "VITAMINB12", "FOLATE",  "FERRITIN", "TIBC", "IRON", "RETICCTPCT" in the last 72 hours. Urine analysis:    Component Value Date/Time   COLORURINE YELLOW 05/21/2021 2128   APPEARANCEUR CLEAR 05/21/2021 2128   LABSPEC 1.010 05/21/2021 2128   PHURINE 7.0 05/21/2021 2128   GLUCOSEU NEGATIVE 05/21/2021 2128   HGBUR NEGATIVE 05/21/2021 2128   HGBUR small 07/21/2008 1538   BILIRUBINUR NEGATIVE 05/21/2021 2128   KETONESUR NEGATIVE 05/21/2021 2128   PROTEINUR NEGATIVE 05/21/2021 2128   UROBILINOGEN 0.2 04/23/2015 1851   NITRITE NEGATIVE 05/21/2021 2128   LEUKOCYTESUR TRACE (A) 05/21/2021 2128   Sepsis Labs: @LABRCNTIP (procalcitonin:4,lacticidven:4)  ) Recent Results (from the past 240 hours)  Resp panel by RT-PCR (RSV, Flu A&B, Covid) Anterior Nasal Swab     Status: None   Collection Time: 05/20/24  8:06 PM   Specimen: Anterior Nasal Swab  Result Value Ref Range Status   SARS Coronavirus 2 by RT PCR NEGATIVE NEGATIVE Final  Comment: (NOTE) SARS-CoV-2 target nucleic acids are NOT DETECTED.  The SARS-CoV-2 RNA is generally detectable in upper respiratory specimens during the acute phase of infection. The lowest concentration of SARS-CoV-2 viral copies this assay can detect is 138 copies/mL. A negative result does not preclude SARS-Cov-2 infection and should not be used as the sole basis for treatment or other patient management decisions. A negative result may occur with  improper specimen collection/handling, submission of specimen other than nasopharyngeal swab, presence of viral mutation(s) within the areas targeted by this assay, and inadequate number of viral copies(<138 copies/mL). A negative result must be combined with clinical observations, patient history, and epidemiological information. The expected result is Negative.  Fact Sheet for Patients:  BloggerCourse.com  Fact Sheet for Healthcare Providers:  SeriousBroker.it  This test is no t yet  approved or cleared by the [place]  FDA and  has been authorized for detection and/or diagnosis of SARS-CoV-2 by FDA under an Emergency Use Authorization (EUA). This EUA will remain  in effect (meaning this test can be used) for the duration of the COVID-19 declaration under Section 564(b)(1) of the Act, 21 U.S.C.section 360bbb-3(b)(1), unless the authorization is terminated  or revoked sooner.       Influenza A by PCR NEGATIVE NEGATIVE Final   Influenza B by PCR NEGATIVE NEGATIVE Final    Comment: (NOTE) The Xpert Xpress SARS-CoV-2/FLU/RSV plus assay is intended as an aid in the diagnosis of influenza from Nasopharyngeal swab specimens and should not be used as a sole basis for treatment. Nasal washings and aspirates are unacceptable for Xpert Xpress SARS-CoV-2/FLU/RSV testing.  Fact Sheet for Patients: BloggerCourse.com  Fact Sheet for Healthcare Providers: SeriousBroker.it  This test is not yet approved or cleared by the [place]  FDA and has been authorized for detection and/or diagnosis of SARS-CoV-2 by FDA under an Emergency Use Authorization (EUA). This EUA will remain in effect (meaning this test can be used) for the duration of the COVID-19 declaration under Section 564(b)(1) of the Act, 21 U.S.C. section 360bbb-3(b)(1), unless the authorization is terminated or revoked.     Resp Syncytial Virus by PCR NEGATIVE NEGATIVE Final    Comment: (NOTE) Fact Sheet for Patients: BloggerCourse.com  Fact Sheet for Healthcare Providers: SeriousBroker.it  This test is not yet approved or cleared by the [place]  FDA and has been authorized for detection and/or diagnosis of SARS-CoV-2 by FDA under an Emergency Use Authorization (EUA). This EUA will remain in effect (meaning this test can be used) for the duration of the COVID-19 declaration under Section 564(b)(1) of  the Act, 21 U.S.C. section 360bbb-3(b)(1), unless the authorization is terminated or revoked.  Performed at [facility], [address], [place], [place] [address]          Radiology Studies: No results found.       Scheduled Meds:  amiodarone   200 mg Oral Daily   arformoterol   15 mcg Nebulization BID   budesonide   0.5 mg Nebulization BID   diltiazem   360 mg Oral Daily   furosemide   40 mg Intravenous BID   gabapentin   400 mg Oral TID   hydroxychloroquine   200 mg Oral q AM   insulin  aspart  0-15 Units Subcutaneous TID WC   insulin  aspart  0-5 Units Subcutaneous QHS   levothyroxine   150 mcg Oral Q0600   melatonin  5 mg Oral QHS   predniSONE   5 mg Oral Daily   revefenacin   175 mcg Inhalation Daily   rivaroxaban   20 mg Oral Q supper   simvastatin   10 mg Oral q1800   traZODone   50 mg Oral QHS   Continuous Infusions:   LOS: 2 days     Raymonde Calico, MD [facility]   If 7PM-7AM, please contact night-coverage [email or web address] Password [facility] 05/23/2024, 12:55 PM

## 2024-05-23 NOTE — Care Management Important Message (Signed)
 Important Message  Patient Details  Name: [name] MRN: [id number] Date of Birth: 12-15-1956   Important Message Given:  Yes - Medicare IM     Anise Kerns 05/23/2024, 3:16 PM

## 2024-05-23 NOTE — Plan of Care (Signed)

## 2024-05-24 ENCOUNTER — Encounter: Payer: Self-pay | Admitting: Internal Medicine

## 2024-05-24 DIAGNOSIS — I5033 Acute on chronic diastolic (congestive) heart failure: Secondary | ICD-10-CM | POA: Diagnosis not present

## 2024-05-24 LAB — CBC
HCT: 27.6 % — ABNORMAL LOW (ref 36.0–46.0)
HCT: 30.3 % — ABNORMAL LOW (ref 36.0–46.0)
Hemoglobin: 8.4 g/dL — ABNORMAL LOW (ref 12.0–15.0)
Hemoglobin: 9.2 g/dL — ABNORMAL LOW (ref 12.0–15.0)
MCH: 29.8 pg (ref 26.0–34.0)
MCH: 29.7 pg (ref 26.0–34.0)
MCHC: 30.4 g/dL (ref 30.0–36.0)
MCHC: 30.4 g/dL (ref 30.0–36.0)
MCV: 97.9 fL (ref 80.0–100.0)
MCV: 97.7 fL (ref 80.0–100.0)
Platelets: 91 10*3/uL — ABNORMAL LOW (ref 150–400)
Platelets: 94 10*3/uL — ABNORMAL LOW (ref 150–400)
RBC: 2.82 MIL/uL — ABNORMAL LOW (ref 3.87–5.11)
RBC: 3.1 MIL/uL — ABNORMAL LOW (ref 3.87–5.11)
RDW: 16.1 % — ABNORMAL HIGH (ref 11.5–15.5)
RDW: 16.3 % — ABNORMAL HIGH (ref 11.5–15.5)
WBC: 6.8 10*3/uL (ref 4.0–10.5)
WBC: 7.7 10*3/uL (ref 4.0–10.5)
nRBC: 0 % (ref 0.0–0.2)
nRBC: 0 % (ref 0.0–0.2)

## 2024-05-24 LAB — BASIC METABOLIC PANEL WITH GFR
Anion gap: 11 (ref 5–15)
BUN: 31 mg/dL — ABNORMAL HIGH (ref 8–23)
CO2: 37 mmol/L — ABNORMAL HIGH (ref 22–32)
Calcium: 8.8 mg/dL — ABNORMAL LOW (ref 8.9–10.3)
Chloride: 90 mmol/L — ABNORMAL LOW (ref 98–111)
Creatinine, Ser: 1.05 mg/dL — ABNORMAL HIGH (ref 0.44–1.00)
GFR, Estimated: 59 mL/min — ABNORMAL LOW (ref >60)
Glucose, Bld: 103 mg/dL — ABNORMAL HIGH (ref 70–99)
Potassium: 3.5 mmol/L (ref 3.5–5.1)
Sodium: 138 mmol/L (ref 135–145)

## 2024-05-24 LAB — GLUCOSE, CAPILLARY
Glucose-Capillary: 107 mg/dL — ABNORMAL HIGH (ref 70–99)
Glucose-Capillary: 117 mg/dL — ABNORMAL HIGH (ref 70–99)
Glucose-Capillary: 155 mg/dL — ABNORMAL HIGH (ref 70–99)

## 2024-05-24 MED ORDER — NYSTATIN 100000 UNIT/GM EX CREA: TOPICAL_CREAM | Freq: Two times a day (BID) | CUTANEOUS | Status: DC

## 2024-05-24 MED ORDER — FUROSEMIDE 80 MG PO TABS: 80.0000 mg | ORAL_TABLET | Freq: Every day | ORAL | Status: DC

## 2024-05-24 MED ORDER — TRAMADOL HCL 50 MG PO TABS
50.0000 mg | ORAL_TABLET | Freq: Three times a day (TID) | ORAL | 0 refills | Status: DC | PRN
Start: 2024-05-24 — End: 2024-06-08

## 2024-05-24 MED ORDER — OXYMETAZOLINE HCL 0.05 % NA SOLN: 1.0000 | Freq: Two times a day (BID) | NASAL | Status: DC

## 2024-05-24 MED ORDER — NYSTATIN 100000 UNIT/GM EX POWD
Freq: Two times a day (BID) | CUTANEOUS | Status: DC
  Filled 2024-05-24: qty 15

## 2024-05-24 MED ORDER — POLYETHYLENE GLYCOL 3350 17 G PO PACK
17.0000 g | PACK | Freq: Every day | ORAL | Status: DC
  Administered 2024-05-24: 17 g via ORAL
  Filled 2024-05-24: qty 1

## 2024-05-24 NOTE — Progress Notes (Signed)
 Physical Therapy Treatment Patient Details Name: BERNITA BECKSTROM MRN: [id number] DOB: 1956/12/05 Today's Date: 05/24/2024   History of Present Illness CORIE ALLIS is a 68 y.o. female with medical history significant for COPD, rheumatoid arthritis, CHF, PE, T2DM, DVT, HTN, HLD, hypothyroidism, chronic respiratory failure on 6 L [place] and nighttime Bipap,  morbid obesity, nicotine dependence, atrial fibrillation s/p cardioversion on 04/22/2024, currently on amiodarone  and Xarelto , recently hospitalized from 5/5 to 04/30/2024 at [facility] for multifactorial respiratory failure related to CHF and pneumonia (EF 55 to 60%),  presenting to the ED with shortness of breath that started about 30 minutes prior to arrival, with O2 sats in the high 70s on her home flow rate of 6 L.    PT Comments  Today's tx involved progression in mobility to increase overall function. Pt able to complete bed mobility with min A d/t consistent verbal and tactile cueing provided to facilitate mobility, as pt raises HOB to a noticeable 45 degrees before using railing to assist with supine <> sit. Pt requiring verbal and tactile cues on manipulation of RW to maximize safety during bed <> chair stand pivot, no LOB experienced but increased time needed to complete transition. Pt involvement with therapy continues to be limited by oxygen desaturations as noted here: SaO2 on 5L at rest = 92%, on 5L while sitting EOB = 87%, on 5L of O2 while standing = 84%, on 5L of O2 at end of session = 91%. Pt would continue to benefit from skilled PT interventions to meet goals. PT to follow acutely.    If plan is discharge home, recommend the following: A lot of help with walking and/or transfers;A lot of help with bathing/dressing/bathroom;Assistance with cooking/housework;Assist for transportation;Help with stairs or ramp for entrance   Can travel by private vehicle     No  Equipment Recommendations  None recommended by PT    Recommendations for  Other Services       Precautions / Restrictions Precautions Precautions: None Restrictions Weight Bearing Restrictions Per Provider Order: No     Mobility  Bed Mobility Overal bed mobility: Needs Assistance Bed Mobility: Supine to Sit Rolling: Min assist   Supine to sit: Min assist     General bed mobility comments: provided verbal and tactile cueing on placement of extremities to facilitate movement    Transfers Overall transfer level: Needs assistance Equipment used: Rolling walker (2 wheels) Transfers: Bed to chair/wheelchair/BSC   Stand pivot transfers: Min assist         General transfer comment: pt requiring verbal and tactile cueing to facilitate movement, increased time needed to perform movement, no LOB   SaO2 on 5L at rest = 92% SaO2 on 5L while sitting EOB = 87% SaO2 on 5L of O2 while staning = 84% SaO2 on 5L of O2 at end of session = 91%   Ambulation/Gait               General Gait Details: unable to perform this date d/t pt fatigue   Stairs             Wheelchair Mobility     Tilt Bed    Modified Rankin (Stroke Patients Only)       Balance Overall balance assessment: Needs assistance Sitting-balance support: Bilateral upper extremity supported, Feet supported Sitting balance-Leahy Scale: Poor Sitting balance - Comments: pt needing x1 railing to stay upright with other UE on bed Postural control: Other (comment) (anterior lean) Standing balance support: Bilateral upper extremity  supported, Reliant on assistive device for balance Standing balance-Leahy Scale: Poor Standing balance comment: use of RW to stay upright with excessive anterior lean, no LOB experienced                            Communication Communication Communication: No apparent difficulties  Cognition Arousal: Alert Behavior During Therapy: WFL for tasks assessed/performed   PT - Cognitive impairments: No apparent impairments                          Following commands: Intact      Cueing Cueing Techniques: Verbal cues, Tactile cues  Exercises      General Comments General comments (skin integrity, edema, etc.): dried blood on sheets, was not an active bleed and nursing notified      Pertinent Vitals/Pain Pain Assessment Pain Assessment: No/denies pain    Home Living                          Prior Function            PT Goals (current goals can now be found in the care plan section) Acute Rehab PT Goals Patient Stated Goal: to get better and wean off O2 PT Goal Formulation: With patient Time For Goal Achievement: 06/04/24 Potential to Achieve Goals: Fair Progress towards PT goals: Progressing toward goals    Frequency    Min 2X/week      PT Plan      Co-evaluation              AM-PAC PT "6 Clicks" Mobility   Outcome Measure  Help needed turning from your back to your side while in a flat bed without using bedrails?: A Little Help needed moving from lying on your back to sitting on the side of a flat bed without using bedrails?: A Lot Help needed moving to and from a bed to a chair (including a wheelchair)?: A Lot Help needed standing up from a chair using your arms (e.g., wheelchair or bedside chair)?: A Lot Help needed to walk in hospital room?: A Lot Help needed climbing 3-5 steps with a railing? : A Lot 6 Click Score: 13    End of Session Equipment Utilized During Treatment: Oxygen Activity Tolerance: Patient limited by fatigue Patient left: in chair;with call bell/phone within reach;with chair alarm set Nurse Communication: Mobility status;Other (comment) (skin condition) PT Visit Diagnosis: Repeated falls (R29.6);Muscle weakness (generalized) (M62.81);History of falling (Z91.81)     Time: 1610-9604 PT Time Calculation (min) (ACUTE ONLY): 21 min  Charges:                            Rieley Hausman Romero-Perozo, SPT  05/24/2024, 12:38 PM

## 2024-05-24 NOTE — Discharge Summary (Signed)
 [name]:[id number] DOB: 1956-01-13 DOA: 05/20/2024  PCP: Marilyn Grimes, PA-C  Admit date: 05/20/2024 Discharge date: 05/24/2024  Time spent: 35 minutes  Recommendations for Outpatient Follow-up:  Chf clinic f/u 6/13 as scheduled, check bmp then     Discharge Diagnoses:  Principal Problem:   Acute on chronic heart failure with preserved ejection fraction (HFpEF) (HCC) Active Problems:   Acute on chronic respiratory failure with hypoxia (HCC)   Epistaxis   Chronic obstructive pulmonary disease (HCC)   History of pulmonary embolism   Atrial fibrillation (HCC)   Chronic anticoagulation   Hypothyroidism   Essential hypertension   Rheumatoid arthritis (HCC)   Obesity, Class III, BMI 40-49.9 (morbid obesity)   OSA on CPAP   Type 2 diabetes mellitus without complication, without long-term current use of insulin  (HCC)   History of DVT (deep vein thrombosis)   CHF exacerbation (HCC)   Discharge Condition: stable  Diet recommendation: heart healthy  Filed Weights   05/22/24 0500 05/23/24 0500 05/24/24 0500  Weight: 87.8 kg 102.4 kg 101.8 kg    History of present illness:  From admission h and p [name] is a 68 y.o. female with medical history significant for COPD, rheumatoid arthritis, CHF, PE, T2DM, DVT, HTN, HLD, hypothyroidism, chronic respiratory failure on 6 L [place] and nighttime Bipap,  morbid obesity, nicotine dependence, atrial fibrillation s/p cardioversion on 04/22/2024, currently on amiodarone  and Xarelto , recently hospitalized from 5/5 to 04/30/2024 at [facility] for multifactorial respiratory failure related to CHF and pneumonia (EF 55 to 60%),  presenting to the ED with shortness of breath that started about 30 minutes prior to arrival, with O2 sats in the high 70s on her home flow rate of 6 L.  She states her BiPAP has been broken for the past 3 days.  Denies chest pain.   Hospital Course:   # Epistaxis Patient says this is primary reason for admission.  Occurring in setting of increased use of nasal cannula, and DOAC. Packed in ER, since removed, no recurrence. - afrin prn - consider [facility] f/u, particularly if epistaxis recurs  # HFpEF Appears compensated, bnp wnl. Pharmacy says snf has not been giving lasix  for the past week, advise resuming lasix  80 daily (appears was once taking twice a day), f/u chf clinic in one week as scheduled, check BMP then.   # Chronic hypoxic respiratory failure # OHS # OSA 2/2 broken bipap, snf says a new unit has been delivered   # COPD No exacerbation - home trelegy   # A-fib Rate controlled - home dilt, amio, xarelto    # Hypothyroid - home synthroid   # Neuropathy, chronic pain - home gabapentin , tramadol    # RA - home plaquenil , arava , prednisone    # T2DM Controlled  Procedures: none   Consultations: none  Discharge Exam: Vitals:   05/24/24 0837 05/24/24 1228  BP: 103/65 101/68  Pulse: 80 82  Resp: 16   Temp: 97.7 F (36.5 C) 98.9 F (37.2 C)  SpO2: 91% 100%    General exam: Appears calm and comfortable, chronically ill Respiratory system: Clear to auscultation. Respiratory effort normal. Cardiovascular system: S1 & S2 heard, irreg, distant Gastrointestinal system: Abdomen is obese, soft and nontender.   Central nervous system: Alert and oriented. No focal neurological deficits. Extremities: Symmetric 5 x 5 power. Skin: venous stasis changes Psychiatry: Judgement and insight appear normal. Mood & affect appropriate.   Discharge Instructions   Discharge Instructions  Diet - low sodium heart healthy   Complete by: As directed    Increase activity slowly   Complete by: As directed       Allergies as of 05/24/2024       Reactions   Oxycodone -acetaminophen  Nausea Only, Other (See Comments)   Violent Vomiting Violent Vomiting   Penicillins Anaphylaxis, Swelling, Rash   Has patient had a PCN reaction causing immediate rash,  facial/tongue/throat swelling, SOB or lightheadedness with hypotension: Yes Has patient had a PCN reaction causing severe rash involving mucus membranes or skin necrosis: No Has patient had a PCN reaction that required hospitalization No Has patient had a PCN reaction occurring within the last 10 years: No If all of the above answers are "NO", then may proceed with Cephalosporin use.   Latex Hives, Itching   Blisters (also)   Oxycodone -acetaminophen  Nausea And Vomiting   Tyloxapol Nausea And Vomiting   Adhesive [tape] Rash   Patient prefers paper tape   Strawberry Extract Hives, Itching, Rash   Wound Dressing Adhesive Dermatitis        Medication List     TAKE these medications    acetaminophen  500 MG tablet Commonly known as: TYLENOL  Take 500 mg by mouth every 8 (eight) hours as needed for mild pain (pain score 1-3).   amiodarone  200 MG tablet Commonly known as: PACERONE  Take 200 mg by mouth daily.   atorvastatin 10 MG tablet Commonly known as: LIPITOR Take 10 mg by mouth at bedtime.   benzonatate 100 MG capsule Commonly known as: TESSALON Take 100 mg by mouth every 8 (eight) hours as needed for cough.   budesonide  0.5 MG/2ML nebulizer solution Commonly known as: PULMICORT  Take 0.5 mg by nebulization 2 (two) times daily.   Cyanocobalamin 1000 MCG Tbcr Take 1 tablet by mouth daily.   diltiazem  360 MG 24 hr capsule Commonly known as: CARDIZEM  CD Take 360 mg by mouth daily.   ferrous sulfate 325 (65 FE) MG EC tablet Take 325 mg by mouth daily with breakfast.   folic acid  1 MG tablet Commonly known as: FOLVITE  Take 1 mg by mouth daily.   furosemide  80 MG tablet Commonly known as: LASIX  Take 1 tablet (80 mg total) by mouth daily. What changed: when to take this   gabapentin  400 MG capsule Commonly known as: NEURONTIN  Take 400 mg by mouth 3 (three) times daily.   hydroxychloroquine  200 MG tablet Commonly known as: PLAQUENIL  Take 200 mg by mouth daily.    insulin  lispro 100 UNIT/ML injection Commonly known as: HUMALOG Inject 2-10 Units into the skin 3 (three) times daily before meals. Sliding scale   ipratropium-albuterol  0.5-2.5 (3) MG/3ML Soln Commonly known as: DUONEB Take 3 mLs by nebulization every 6 (six) hours.   levothyroxine  150 MCG tablet Commonly known as: SYNTHROID  Take 150 mcg by mouth daily before breakfast.   melatonin 5 MG Tabs Take 5 mg by mouth at bedtime.   montelukast 10 MG tablet Commonly known as: SINGULAIR Take 10 mg by mouth daily.   multivitamin tablet Take 1 tablet by mouth daily.   naloxone 4 MG/0.1ML Liqd nasal spray kit Commonly known as: NARCAN Place 1 spray into the nose 3 (three) times daily as needed (symptoms of opiod overdose).   oxymetazoline  0.05 % nasal spray Commonly known as: AFRIN Place 1 spray into both nostrils 2 (two) times daily.   pantoprazole  40 MG tablet Commonly known as: PROTONIX  Take 40 mg by mouth daily.   predniSONE  5 MG tablet  Commonly known as: DELTASONE  Take 5 mg by mouth daily with breakfast.   rivaroxaban  20 MG Tabs tablet Commonly known as: XARELTO  Take 20 mg by mouth in the morning.   sennosides-docusate sodium  8.6-50 MG tablet Commonly known as: SENOKOT-S Take 2 tablets by mouth 2 (two) times daily.   sertraline 25 MG tablet Commonly known as: ZOLOFT Take 25 mg by mouth daily. Takes with 50mg  tab = 75mg  daily   sertraline 50 MG tablet Commonly known as: ZOLOFT Take 50 mg by mouth daily. Takes with 25mg  tab = 75mg  daily   traMADol  50 MG tablet Commonly known as: ULTRAM  Take 1 tablet (50 mg total) by mouth every 8 (eight) hours as needed for moderate pain (pain score 4-6).       Allergies  Allergen Reactions   Oxycodone -Acetaminophen  Nausea Only and Other (See Comments)    Violent Vomiting Violent Vomiting   Penicillins Anaphylaxis, Swelling and Rash    Has patient had a PCN reaction causing immediate rash, facial/tongue/throat swelling, SOB  or lightheadedness with hypotension: Yes Has patient had a PCN reaction causing severe rash involving mucus membranes or skin necrosis: No Has patient had a PCN reaction that required hospitalization No Has patient had a PCN reaction occurring within the last 10 years: No If all of the above answers are "NO", then may proceed with Cephalosporin use.    Latex Hives and Itching    Blisters (also)   Oxycodone -Acetaminophen  Nausea And Vomiting   Tyloxapol Nausea And Vomiting   Adhesive [Tape] Rash    Patient prefers paper tape   Strawberry Extract Hives, Itching and Rash   Wound Dressing Adhesive Dermatitis    Follow-up Information     [facility]. Go on 05/28/2024.   Specialty: Cardiology Why: Hospital Follow-up 05/28/24 @ 1:30 PM Please bring all medications to follow-up appointment [facility], [address] Floor Contact information: [address] [place]  [address] [phone]        Marilyn Grimes, PA-C Follow up.   Specialty: Physician Assistant Contact information: [address] [place] [address] [phone]                  The results of significant diagnostics from this hospitalization (including imaging, microbiology, ancillary and laboratory) are listed below for reference.    Significant Diagnostic Studies: DG Chest Portable 1 View Result Date: 05/20/2024 CLINICAL DATA:  Shortness of breath beginning about 30 minutes ago. Baseline oxygen use. Low oxygen saturation. EXAM: PORTABLE CHEST 1 VIEW COMPARISON:  04/29/2024 FINDINGS: Shallow inspiration. Cardiac enlargement. Mild pulmonary vascular congestion. Hazy infiltrates in the lungs likely indicating edema. Small bilateral pleural effusions. No pneumothorax. Prominence of the left pulmonary outflow tract. Calcification of the aorta. Similar appearance to the previous study.  IMPRESSION: Cardiac enlargement with pulmonary vascular congestion and mild pulmonary infiltrates, likely edema. Small bilateral pleural effusions. Electronically Signed   By: Boyce Byes M.D.   On: 05/20/2024 20:23    Microbiology: Recent Results (from the past 240 hours)  Resp panel by RT-PCR (RSV, Flu A&B, Covid) Anterior Nasal Swab     Status: None   Collection Time: 05/20/24  8:06 PM   Specimen: Anterior Nasal Swab  Result Value Ref Range Status   SARS Coronavirus 2 by RT PCR NEGATIVE NEGATIVE Final    Comment: (NOTE) SARS-CoV-2 target nucleic acids are NOT DETECTED.  The SARS-CoV-2 RNA is generally detectable  in upper respiratory specimens during the acute phase of infection. The lowest concentration of SARS-CoV-2 viral copies this assay can detect is 138 copies/mL. A negative result does not preclude SARS-Cov-2 infection and should not be used as the sole basis for treatment or other patient management decisions. A negative result may occur with  improper specimen collection/handling, submission of specimen other than nasopharyngeal swab, presence of viral mutation(s) within the areas targeted by this assay, and inadequate number of viral copies(<138 copies/mL). A negative result must be combined with clinical observations, patient history, and epidemiological information. The expected result is Negative.  Fact Sheet for Patients:  BloggerCourse.com  Fact Sheet for Healthcare Providers:  SeriousBroker.it  This test is no t yet approved or cleared by the [place]  FDA and  has been authorized for detection and/or diagnosis of SARS-CoV-2 by FDA under an Emergency Use Authorization (EUA). This EUA will remain  in effect (meaning this test can be used) for the duration of the COVID-19 declaration under Section 564(b)(1) of the Act, 21 U.S.C.section 360bbb-3(b)(1), unless the authorization is terminated  or revoked  sooner.       Influenza A by PCR NEGATIVE NEGATIVE Final   Influenza B by PCR NEGATIVE NEGATIVE Final    Comment: (NOTE) The Xpert Xpress SARS-CoV-2/FLU/RSV plus assay is intended as an aid in the diagnosis of influenza from Nasopharyngeal swab specimens and should not be used as a sole basis for treatment. Nasal washings and aspirates are unacceptable for Xpert Xpress SARS-CoV-2/FLU/RSV testing.  Fact Sheet for Patients: BloggerCourse.com  Fact Sheet for Healthcare Providers: SeriousBroker.it  This test is not yet approved or cleared by the [place]  FDA and has been authorized for detection and/or diagnosis of SARS-CoV-2 by FDA under an Emergency Use Authorization (EUA). This EUA will remain in effect (meaning this test can be used) for the duration of the COVID-19 declaration under Section 564(b)(1) of the Act, 21 U.S.C. section 360bbb-3(b)(1), unless the authorization is terminated or revoked.     Resp Syncytial Virus by PCR NEGATIVE NEGATIVE Final    Comment: (NOTE) Fact Sheet for Patients: BloggerCourse.com  Fact Sheet for Healthcare Providers: SeriousBroker.it  This test is not yet approved or cleared by the [place]  FDA and has been authorized for detection and/or diagnosis of SARS-CoV-2 by FDA under an Emergency Use Authorization (EUA). This EUA will remain in effect (meaning this test can be used) for the duration of the COVID-19 declaration under Section 564(b)(1) of the Act, 21 U.S.C. section 360bbb-3(b)(1), unless the authorization is terminated or revoked.  Performed at [facility], [address] [place], [place] [address]      Labs: Basic Metabolic Panel: Recent Labs  Lab 05/20/24 2006 05/21/24 0500 05/22/24 0429 05/23/24 0448 05/24/24 0347  NA 139 140 140 138 138  K 4.6 3.6 3.4* 3.5 3.5  CL 105 103 96* 94* 90*  CO2 28 30  36* 34* 37*  GLUCOSE 112* 77 81 91 103*  BUN 21 28* 24* 22 31*  CREATININE 0.75 0.76 0.86 0.98 1.05*  CALCIUM  8.3* 8.1* 8.1* 8.4* 8.8*   Liver Function Tests: Recent Labs  Lab 05/20/24 2006  AST 12*  ALT 14  ALKPHOS 55  BILITOT 0.6  PROT 6.0*  ALBUMIN 2.8*   No results for input(s): "LIPASE", "AMYLASE" in the last 168 hours. No results for input(s): "AMMONIA" in the last 168 hours. CBC: Recent Labs  Lab 05/20/24 2006 05/21/24 0500 05/23/24 2108 05/24/24 0347 05/24/24 0923  WBC 9.2  8.2 7.2 6.8 7.7  NEUTROABS 7.9*  --   --   --   --   HGB 9.1* 9.0* 8.4* 8.4* 9.2*  HCT 30.1* 29.4* 26.9* 27.6* 30.3*  MCV 100.0 99.3 96.8 97.9 97.7  PLT 108* 100* 96* 91* 94*   Cardiac Enzymes: No results for input(s): "CKTOTAL", "CKMB", "CKMBINDEX", "TROPONINI" in the last 168 hours. BNP: BNP (last 3 results) Recent Labs    05/20/24 2006  BNP 82.2    ProBNP (last 3 results) No results for input(s): "PROBNP" in the last 8760 hours.  CBG: Recent Labs  Lab 05/23/24 1204 05/23/24 1632 05/23/24 1952 05/24/24 0831 05/24/24 1225  GLUCAP 133* 153* 129* 107* 117*       Signed:  Raymonde Calico MD.  [facility] 05/24/2024, 2:33 PM

## 2024-05-24 NOTE — TOC Transition Note (Signed)
 Transition of Care [facility]) - Discharge Note   Patient Details  Name: [name] MRN: [id number] Date of Birth: 1956-01-15  [facility]) CM/SW Contact:  Braeden Dolinski C Diora Bellizzi, RN Phone Number: 05/24/2024, 2:44 PM   Clinical Narrative:    Spoke with Gena in admissions at [facility]  Per facility patient admission confirmed for today. Patient assigned room # 606-B Nurse will call report to [phone] Face sheet and medical necessity forms printed to the floor to be added to the EMS pack EMS arranged " Around 5pm."  Discharge summary and SNF transfer report sent in HUB.  Nurse,patient, and family notified  TOC signing off.          Patient Goals and CMS Choice            Discharge Placement                       Discharge Plan and Services Additional resources added to the After Visit Summary for                                       Social Drivers of Health (SDOH) Interventions SDOH Screenings   Food Insecurity: No Food Insecurity (05/24/2024)  Housing: Low Risk  (05/24/2024)  Transportation Needs: No Transportation Needs (05/24/2024)  Utilities: Not At Risk (05/21/2024)  Financial Resource Strain: High Risk (05/24/2024)  Social Connections: Socially Isolated (05/21/2024)  Tobacco Use: Medium Risk (05/24/2024)     Readmission Risk Interventions     No data to display

## 2024-05-24 NOTE — TOC Progression Note (Signed)
 Transition of Care [facility]) - Progression Note    Patient Details  Name: [name] MRN: [id number] Date of Birth: 12-10-1956  [facility]) CM/SW Contact  Baird Bombard, RN Phone Number: 05/24/2024, 2:15 PM  Clinical Narrative:    Spoke with Gena from [facility]. Patient can admit today. Gena stated patient has 18 days left at 100%. Patient will be required to pay $209.50/day upfront for seven days on 21st day and family will need to apply for LTSS.   Spoke with patient's Daughter-in-law, [name]. She was advised patient can return to [facility] but will need assistance with completing [facility] LTSS once at [facility]. [name] reports concerns about facility having BIPAP. She was advised the facility will provide the BIPAP.   2:39pm Gena in admissions at [facility] confirmed BIPAP is functional and available for patient's return.         Expected Discharge Plan and Services                                               Social Determinants of Health (SDOH) Interventions SDOH Screenings   Food Insecurity: No Food Insecurity (05/24/2024)  Housing: Low Risk  (05/24/2024)  Transportation Needs: No Transportation Needs (05/24/2024)  Utilities: Not At Risk (05/21/2024)  Financial Resource Strain: High Risk (05/24/2024)  Social Connections: Socially Isolated (05/21/2024)  Tobacco Use: Medium Risk (05/24/2024)    Readmission Risk Interventions     No data to display

## 2024-05-24 NOTE — Progress Notes (Signed)
 Heart Failure Nurse Navigator Progress Note  PCP: [name], PA-C PCP-Cardiologist: No Admission Diagnosis: Epistaxis Acute respiratory failure with hypoxia Acute on chronic congestive heart failure, unspecified heart failure type [facility])  Admitted from: [facility]  Presentation:   [name] presented with increasing shortness of breath. Patient reported that she had not had her BiPAP machine for the past 3 days.  Instead wearing O2 [place] 24/7. Patient reported the dry air has made her nose start to bleed in her right nostril.  She also had some swelling in her legs which is a constant issue for her. BNP 82.2. Chest x-ray: Cardiac enlargement with pulmonary vascular congestion and mild pulmonary infiltrates, likely edema, Small bilateral pleural effusions.   ECHO/ LVEF: 55-60% -TEE [facility] 04/26/24 RV moderately dilated & systolic function is mildly reduced.  LA is mildly dilated. RA mildly to moderately dilated.mild to moderate mitral regurgitation.  Mild tricuspid regurgitation. Mild pulmonary hypertension. Mild pulmonic valvular regurgitation. IVC mildly dilated.  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    HTN (hypertension)    Hypercholesteremia    Hypothyroid    Rheumatoid arthritis(714.0)    Varicose vein of leg      Social History   Socioeconomic History   Marital status: Divorced    Spouse name: Not on file   Number of children: 1   Years of education: Not on file   Highest education level: Some college, no degree  Occupational History   Occupation: Nutritional therapist: [employer]   Occupation: Retired  Tobacco Use   Smoking status: Former    Current packs/day: 0.00    Average packs/day: 1 pack/day for 40.0 years (40.0 ttl pk-yrs)     Types: Cigarettes    Start date: 73    Quit date: 2021    Years since quitting: 4.4   Smokeless tobacco: Never   Tobacco comments:    < 1/2 pack/day  Vaping Use   Vaping status: Never Used  Substance and Sexual Activity   Alcohol use: No   Drug use: No   Sexual activity: Never  Other Topics Concern   Not on file  Social History Narrative   Not on file   Social Drivers of Health   Financial Resource Strain: High Risk (05/24/2024)   Overall Financial Resource Strain (CARDIA)    Difficulty of Paying Living Expenses: Very hard  Food Insecurity: No Food Insecurity (05/24/2024)   Hunger Vital Sign    Worried About Running Out of Food in the Last Year: Never true    Ran Out of Food in the Last Year: Never true  Transportation Needs: No Transportation Needs (05/24/2024)   PRAPARE - Administrator, Civil Service (Medical): No    Lack of Transportation (Non-Medical): No  Physical Activity: Not on file  Stress: Not on file  Social Connections: Socially Isolated (05/21/2024)   Social Connection and Isolation Panel [NHANES]    Frequency of Communication with Friends and Family: More than three times a week    Frequency of Social Gatherings with Friends and Family: Never    Attends Religious Services: Never    Database administrator or Organizations: No  Attends Banker Meetings: Never    Marital Status: Divorced   Water engineer and Provision:  Detailed education and instructions provided on heart failure disease management including the following:  Signs and symptoms of Heart Failure When to call the physician Importance of daily weights Low sodium diet Fluid restriction Medication management Anticipated future follow-up appointments  Patient education given on each of the above topics.  Patient acknowledges understanding via teach back method and acceptance of all instructions.  Education Materials:  "Living Better With Heart Failure" Booklet, HF  zone tool, & Daily Weight Tracker Tool.  Patient has scale at home: Yes @ SNF Patient has pill box at home: SNF administers medications to patient  High Risk Criteria for Readmission and/or Poor Patient Outcomes: Heart failure hospital admissions (last 6 months): 1  No Show rate: 12% Difficult social situation: none Demonstrates medication adherence: Yes Primary Language: English Literacy level: Reading, Writing & Comprehension  Barriers of Care:   Transportation   Considerations/Referrals:   Referral made to Heart Failure Pharmacist Stewardship: Yes Referral made to Heart Failure CSW/NCM TOC: None Referral made to Heart & Vascular TOC clinic: Yes. 05/28/24 @ 1:30   Items for Follow-up on DC/TOC: Diet & Fluid Restrictions Daily weights Continued Heart Failure Education  Celedonio Coil, RN, BSN [facility] Heart Failure Navigator Secure Chat Only

## 2024-05-27 ENCOUNTER — Telehealth: Payer: Self-pay | Admitting: Family

## 2024-05-27 NOTE — Telephone Encounter (Signed)
 Called to confirm/remind patient of their appointment at the [facility] on 05/28/24.   Appointment:   [] Confirmed  [] Left mess   [] No answer/No voice mail  [x] VM Full/unable to leave message  [] Phone not in service  Patient reminded to bring all medications and/or complete list.  Confirmed patient has transportation. Gave directions, instructed to utilize valet parking.

## 2024-05-28 ENCOUNTER — Encounter

## 2024-05-28 NOTE — Progress Notes (Deleted)
 HEART & VASCULAR TRANSITION OF CARE CONSULT NOTE     Referring Physician: [name], PA-C   Chief Complaint:   HPI: Referred to clinic by *** for heart failure consultation.   [name] is a 68 y.o. 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     amiodarone  (PACERONE ) 200 MG tablet Take 200 mg by mouth daily.     atorvastatin (LIPITOR) 10 MG tablet Take 10 mg by mouth at bedtime.     benzonatate (TESSALON) 100 MG capsule Take 100 mg by mouth every 8 (eight) hours as needed for cough.     budesonide  (PULMICORT ) 0.5 MG/2ML nebulizer solution Take 0.5 mg by nebulization 2 (two) times daily.     Cyanocobalamin 1000 MCG TBCR Take 1 tablet by mouth daily.     diltiazem  (CARDIZEM  CD) 360 MG 24 hr capsule Take 360 mg by mouth daily.     ferrous sulfate 325 (65 FE) MG EC tablet Take 325 mg by mouth daily with breakfast.     folic acid  (FOLVITE ) 1 MG tablet Take 1 mg by mouth daily.     furosemide  (LASIX ) 80 MG tablet Take 1 tablet (80 mg total) by mouth daily.     gabapentin  (NEURONTIN ) 400 MG capsule Take 400 mg by mouth 3 (three) times daily.     hydroxychloroquine  (PLAQUENIL ) 200 MG tablet Take 200 mg by mouth daily.     insulin  lispro (HUMALOG) 100 UNIT/ML injection Inject 2-10 Units into the skin 3 (three) times daily before meals. Sliding scale     ipratropium-albuterol  (DUONEB) 0.5-2.5 (3) MG/3ML SOLN Take 3 mLs by nebulization every 6 (six)  hours.     levothyroxine  (SYNTHROID ) 150 MCG tablet Take 150 mcg by mouth daily before breakfast.     melatonin 5 MG TABS Take 5 mg by mouth at bedtime.     montelukast (SINGULAIR) 10 MG tablet Take 10 mg by mouth daily.     Multiple Vitamin (MULTIVITAMIN) tablet Take 1 tablet by mouth daily.     naloxone (NARCAN) nasal spray 4 mg/0.1 mL Place 1 spray into the nose 3 (three) times daily as needed (symptoms of opiod overdose).     oxymetazoline  (AFRIN) 0.05 % nasal spray Place 1 spray into both nostrils 2 (two) times daily.     pantoprazole  (PROTONIX ) 40 MG tablet Take 40 mg by mouth daily.     predniSONE  (DELTASONE ) 5 MG tablet Take 5 mg by mouth daily with breakfast.     rivaroxaban  (XARELTO ) 20 MG TABS tablet Take 20 mg by mouth in the morning.     sennosides-docusate sodium  (SENOKOT-S) 8.6-50 MG tablet Take 2 tablets by mouth 2 (two) times daily.     sertraline  (ZOLOFT ) 25 MG tablet Take 25 mg by mouth daily. Takes with 50mg  tab = 75mg  daily  sertraline  (ZOLOFT ) 50 MG tablet Take 50 mg by mouth daily. Takes with 25mg  tab = 75mg  daily     traMADol  (ULTRAM ) 50 MG tablet Take 1 tablet (50 mg total) by mouth every 8 (eight) hours as needed for moderate pain (pain score 4-6). 30 tablet 0   No current facility-administered medications for this visit.    Allergies  Allergen Reactions   Oxycodone -Acetaminophen  Nausea Only and Other (See Comments)    Violent Vomiting Violent Vomiting   Penicillins Anaphylaxis, Swelling and Rash    Has patient had a PCN reaction causing immediate rash, facial/tongue/throat swelling, SOB or lightheadedness with hypotension: Yes Has patient had a PCN reaction causing severe rash involving mucus membranes or skin necrosis: No Has patient had a PCN reaction that required hospitalization No Has patient had a PCN reaction occurring within the last 10 years: No If all of the above answers are NO, then may proceed with Cephalosporin use.    Latex Hives and  Itching    Blisters (also)   Oxycodone -Acetaminophen  Nausea And Vomiting   Tyloxapol Nausea And Vomiting   Adhesive [Tape] Rash    Patient prefers paper tape   Strawberry Extract Hives, Itching and Rash   Wound Dressing Adhesive Dermatitis      Social History   Socioeconomic History   Marital status: Divorced    Spouse name: Not on file   Number of children: 1   Years of education: Not on file   Highest education level: Some college, no degree  Occupational History   Occupation: Nutritional therapist: [employer]   Occupation: Retired  Tobacco Use   Smoking status: Former    Current packs/day: 0.00    Average packs/day: 1 pack/day for 40.0 years (40.0 ttl pk-yrs)    Types: Cigarettes    Start date: 49    Quit date: 2021    Years since quitting: 4.4   Smokeless tobacco: Never   Tobacco comments:    < 1/2 pack/day  Vaping Use   Vaping status: Never Used  Substance and Sexual Activity   Alcohol use: No   Drug use: No   Sexual activity: Never  Other Topics Concern   Not on file  Social History Narrative   Not on file   Social Drivers of Health   Financial Resource Strain: High Risk (05/24/2024)   Overall Financial Resource Strain (CARDIA)    Difficulty of Paying Living Expenses: Very hard  Food Insecurity: No Food Insecurity (05/24/2024)   Hunger Vital Sign    Worried About Running Out of Food in the Last Year: Never true    Ran Out of Food in the Last Year: Never true  Transportation Needs: No Transportation Needs (05/24/2024)   PRAPARE - Administrator, Civil Service (Medical): No    Lack of Transportation (Non-Medical): No  Physical Activity: Not on file  Stress: Not on file  Social Connections: Socially Isolated (05/21/2024)   Social Connection and Isolation Panel    Frequency of Communication with Friends and Family: More than three times a week    Frequency of Social Gatherings with Friends and Family: Never    Attends Religious Services:  Never    Database administrator or Organizations: No    Attends Banker Meetings: Never    Marital Status: Divorced  Catering manager Violence: Not At Risk (05/21/2024)   Humiliation, Afraid, Rape, and Kick questionnaire    Fear of Current or Ex-Partner: No  Emotionally Abused: No    Physically Abused: No    Sexually Abused: No      Family History  Problem Relation Age of Onset   Diabetes Mother    Kidney disease Mother    Lung cancer Father    Alzheimer's disease Maternal Grandmother    Stroke Maternal Grandfather    Heart disease Paternal Grandmother    Unexplained death Paternal Grandfather     There were no vitals filed for this visit.  PHYSICAL EXAM: General:  *** appearing.  No respiratory difficulty HEENT: normal Neck: supple. JVD *** cm.  Cor: PMI nondisplaced. Regular rate & rhythm. No rubs, gallops or murmurs. Lungs: clear Abdomen: soft, nontender, nondistended. Good bowel sounds. Extremities: no cyanosis, clubbing, rash, edema  Neuro: alert & oriented x 3. Moves all 4 extremities w/o difficulty. Affect pleasant.   ECG:   ASSESSMENT & PLAN: Chronic HFpEF Echo 3/19: EF 60-65%, mod LVH, mild MR NYHA *** GDMT  Diuretic- Continue lasix  80 mg daily BB- Ace/ARB/ARNI MRA SGLT2i - would update echo   Chronic hypoxic resp failure COPD - Per primary - wear CPAP at SNF ***  A fib - Continue amiodarone  200 mg daily - Continue diltiazem  350 mg daily     Referred to HFSW (PCP, Medications, Transportation, ETOH Abuse, Drug Abuse, Insurance, Surveyor, quantity ): No Refer to Pharmacy:  No Refer to Home Health: No Refer to Advanced Heart Failure Clinic: No  Refer to General Cardiology: Yes  Follow up with general cardiology. Referral placed today.

## 2024-05-30 ENCOUNTER — Other Ambulatory Visit: Payer: Self-pay

## 2024-05-30 ENCOUNTER — Inpatient Hospital Stay
Admission: EM | Admit: 2024-05-30 | Discharge: 2024-06-08 | DRG: 189 | Disposition: A | Discharge status: Skilled Nursing Facility | Source: Skilled Nursing Facility | Attending: Obstetrics and Gynecology | Admitting: Obstetrics and Gynecology

## 2024-05-30 ENCOUNTER — Emergency Department

## 2024-05-30 DIAGNOSIS — J439 Emphysema, unspecified: Secondary | ICD-10-CM | POA: Diagnosis present

## 2024-05-30 DIAGNOSIS — J9692 Respiratory failure, unspecified with hypercapnia: Secondary | ICD-10-CM | POA: Diagnosis present

## 2024-05-30 DIAGNOSIS — Z833 Family history of diabetes mellitus: Secondary | ICD-10-CM

## 2024-05-30 DIAGNOSIS — Z9851 Tubal ligation status: Secondary | ICD-10-CM

## 2024-05-30 DIAGNOSIS — G4733 Obstructive sleep apnea (adult) (pediatric): Secondary | ICD-10-CM | POA: Diagnosis present

## 2024-05-30 DIAGNOSIS — Z7952 Long term (current) use of systemic steroids: Secondary | ICD-10-CM

## 2024-05-30 DIAGNOSIS — Z880 Allergy status to penicillin: Secondary | ICD-10-CM

## 2024-05-30 DIAGNOSIS — E7800 Pure hypercholesterolemia, unspecified: Secondary | ICD-10-CM | POA: Diagnosis present

## 2024-05-30 DIAGNOSIS — N179 Acute kidney failure, unspecified: Secondary | ICD-10-CM | POA: Diagnosis present

## 2024-05-30 DIAGNOSIS — Z8249 Family history of ischemic heart disease and other diseases of the circulatory system: Secondary | ICD-10-CM

## 2024-05-30 DIAGNOSIS — Z1152 Encounter for screening for COVID-19: Secondary | ICD-10-CM

## 2024-05-30 DIAGNOSIS — I959 Hypotension, unspecified: Secondary | ICD-10-CM | POA: Diagnosis present

## 2024-05-30 DIAGNOSIS — I4891 Unspecified atrial fibrillation: Secondary | ICD-10-CM | POA: Diagnosis present

## 2024-05-30 DIAGNOSIS — J9622 Acute and chronic respiratory failure with hypercapnia: Secondary | ICD-10-CM | POA: Diagnosis present

## 2024-05-30 DIAGNOSIS — J441 Chronic obstructive pulmonary disease with (acute) exacerbation: Secondary | ICD-10-CM | POA: Diagnosis present

## 2024-05-30 DIAGNOSIS — I509 Heart failure, unspecified: Secondary | ICD-10-CM

## 2024-05-30 DIAGNOSIS — E440 Moderate protein-calorie malnutrition: Secondary | ICD-10-CM | POA: Diagnosis present

## 2024-05-30 DIAGNOSIS — I5032 Chronic diastolic (congestive) heart failure: Secondary | ICD-10-CM | POA: Diagnosis present

## 2024-05-30 DIAGNOSIS — J9601 Acute respiratory failure with hypoxia: Secondary | ICD-10-CM | POA: Diagnosis not present

## 2024-05-30 DIAGNOSIS — E66813 Obesity, class 3: Secondary | ICD-10-CM | POA: Diagnosis present

## 2024-05-30 DIAGNOSIS — D638 Anemia in other chronic diseases classified elsewhere: Secondary | ICD-10-CM | POA: Diagnosis present

## 2024-05-30 DIAGNOSIS — M069 Rheumatoid arthritis, unspecified: Secondary | ICD-10-CM | POA: Diagnosis present

## 2024-05-30 DIAGNOSIS — Z86718 Personal history of other venous thrombosis and embolism: Secondary | ICD-10-CM

## 2024-05-30 DIAGNOSIS — Z91040 Latex allergy status: Secondary | ICD-10-CM

## 2024-05-30 DIAGNOSIS — Z9049 Acquired absence of other specified parts of digestive tract: Secondary | ICD-10-CM

## 2024-05-30 DIAGNOSIS — Z9102 Food additives allergy status: Secondary | ICD-10-CM

## 2024-05-30 DIAGNOSIS — Z91048 Other nonmedicinal substance allergy status: Secondary | ICD-10-CM

## 2024-05-30 DIAGNOSIS — G8929 Other chronic pain: Secondary | ICD-10-CM | POA: Diagnosis present

## 2024-05-30 DIAGNOSIS — R040 Epistaxis: Secondary | ICD-10-CM | POA: Diagnosis present

## 2024-05-30 DIAGNOSIS — Z79899 Other long term (current) drug therapy: Secondary | ICD-10-CM

## 2024-05-30 DIAGNOSIS — E1140 Type 2 diabetes mellitus with diabetic neuropathy, unspecified: Secondary | ICD-10-CM | POA: Diagnosis present

## 2024-05-30 DIAGNOSIS — Z794 Long term (current) use of insulin: Secondary | ICD-10-CM

## 2024-05-30 DIAGNOSIS — E669 Obesity, unspecified: Secondary | ICD-10-CM | POA: Diagnosis present

## 2024-05-30 DIAGNOSIS — Z91198 Patient's noncompliance with other medical treatment and regimen for other reason: Secondary | ICD-10-CM

## 2024-05-30 DIAGNOSIS — I110 Hypertensive heart disease with heart failure: Secondary | ICD-10-CM | POA: Diagnosis present

## 2024-05-30 DIAGNOSIS — Z885 Allergy status to narcotic agent status: Secondary | ICD-10-CM

## 2024-05-30 DIAGNOSIS — Z79890 Hormone replacement therapy (postmenopausal): Secondary | ICD-10-CM

## 2024-05-30 DIAGNOSIS — J9621 Acute and chronic respiratory failure with hypoxia: Principal | ICD-10-CM | POA: Diagnosis present

## 2024-05-30 DIAGNOSIS — Z7951 Long term (current) use of inhaled steroids: Secondary | ICD-10-CM

## 2024-05-30 DIAGNOSIS — I878 Other specified disorders of veins: Secondary | ICD-10-CM | POA: Diagnosis present

## 2024-05-30 DIAGNOSIS — D67 Hereditary factor IX deficiency: Secondary | ICD-10-CM | POA: Diagnosis present

## 2024-05-30 DIAGNOSIS — E871 Hypo-osmolality and hyponatremia: Secondary | ICD-10-CM | POA: Diagnosis present

## 2024-05-30 DIAGNOSIS — Z7901 Long term (current) use of anticoagulants: Secondary | ICD-10-CM

## 2024-05-30 DIAGNOSIS — Z86711 Personal history of pulmonary embolism: Secondary | ICD-10-CM

## 2024-05-30 DIAGNOSIS — F17200 Nicotine dependence, unspecified, uncomplicated: Secondary | ICD-10-CM | POA: Diagnosis present

## 2024-05-30 DIAGNOSIS — Z6841 Body Mass Index (BMI) 40.0 and over, adult: Secondary | ICD-10-CM

## 2024-05-30 LAB — COMPREHENSIVE METABOLIC PANEL WITH GFR
ALT: 19 U/L (ref 0–44)
AST: 19 U/L (ref 15–41)
Albumin: 3.1 g/dL — ABNORMAL LOW (ref 3.5–5.0)
Alkaline Phosphatase: 59 U/L (ref 38–126)
Anion gap: 13 (ref 5–15)
BUN: 31 mg/dL — ABNORMAL HIGH (ref 8–23)
CO2: 30 mmol/L (ref 22–32)
Calcium: 9.3 mg/dL (ref 8.9–10.3)
Chloride: 90 mmol/L — ABNORMAL LOW (ref 98–111)
Creatinine, Ser: 1.22 mg/dL — ABNORMAL HIGH (ref 0.44–1.00)
GFR, Estimated: 49 mL/min — ABNORMAL LOW (ref >60)
Glucose, Bld: 144 mg/dL — ABNORMAL HIGH (ref 70–99)
Potassium: 3.9 mmol/L (ref 3.5–5.1)
Sodium: 133 mmol/L — ABNORMAL LOW (ref 135–145)
Total Bilirubin: 0.8 mg/dL (ref 0.0–1.2)
Total Protein: 6.4 g/dL — ABNORMAL LOW (ref 6.5–8.1)

## 2024-05-30 LAB — RETICULOCYTES
Immature Retic Fract: 31.8 % — ABNORMAL HIGH (ref 2.3–15.9)
RBC.: 2.59 MIL/uL — ABNORMAL LOW (ref 3.87–5.11)
Retic Count, Absolute: 182.1 10*3/uL (ref 19.0–186.0)
Retic Ct Pct: 7 % — ABNORMAL HIGH (ref 0.4–3.1)

## 2024-05-30 LAB — IRON AND TIBC
Iron: 59 ug/dL (ref 28–170)
Saturation Ratios: 16 % (ref 10.4–31.8)
TIBC: 368 ug/dL (ref 250–450)
UIBC: 309 ug/dL

## 2024-05-30 LAB — GLUCOSE, CAPILLARY: Glucose-Capillary: 150 mg/dL — ABNORMAL HIGH (ref 70–99)

## 2024-05-30 LAB — FOLATE: Folate: >40 ng/mL (ref >5.9)

## 2024-05-30 LAB — CBC WITH DIFFERENTIAL/PLATELET
Abs Immature Granulocytes: 0.49 10*3/uL — ABNORMAL HIGH (ref 0.00–0.07)
Basophils Absolute: 0 10*3/uL (ref 0.0–0.1)
Basophils Relative: 0 %
Eosinophils Absolute: 0 10*3/uL (ref 0.0–0.5)
Eosinophils Relative: 1 %
HCT: 25.6 % — ABNORMAL LOW (ref 36.0–46.0)
Hemoglobin: 7.9 g/dL — ABNORMAL LOW (ref 12.0–15.0)
Immature Granulocytes: 6 %
Lymphocytes Relative: 5 %
Lymphs Abs: 0.4 10*3/uL — ABNORMAL LOW (ref 0.7–4.0)
MCH: 30.7 pg (ref 26.0–34.0)
MCHC: 30.9 g/dL (ref 30.0–36.0)
MCV: 99.6 fL (ref 80.0–100.0)
Monocytes Absolute: 0.8 10*3/uL (ref 0.1–1.0)
Monocytes Relative: 10 %
Neutro Abs: 6.1 10*3/uL (ref 1.7–7.7)
Neutrophils Relative %: 78 %
Platelets: 224 10*3/uL (ref 150–400)
RBC: 2.57 MIL/uL — ABNORMAL LOW (ref 3.87–5.11)
RDW: 16.5 % — ABNORMAL HIGH (ref 11.5–15.5)
Smear Review: NORMAL
WBC: 7.9 10*3/uL (ref 4.0–10.5)
nRBC: 1.5 % — ABNORMAL HIGH (ref 0.0–0.2)

## 2024-05-30 LAB — BLOOD GAS, VENOUS
Acid-Base Excess: 13 mmol/L — ABNORMAL HIGH (ref 0.0–2.0)
Bicarbonate: 42.7 mmol/L — ABNORMAL HIGH (ref 20.0–28.0)
O2 Saturation: 51.3 %
Patient temperature: 37
pCO2, Ven: 81 mmHg (ref 44–60)
pH, Ven: 7.33 (ref 7.25–7.43)
pO2, Ven: 34 mmHg (ref 32–45)

## 2024-05-30 LAB — TROPONIN I (HIGH SENSITIVITY)
Troponin I (High Sensitivity): 18 ng/L — ABNORMAL HIGH (ref <18)
Troponin I (High Sensitivity): 21 ng/L — ABNORMAL HIGH (ref <18)

## 2024-05-30 LAB — RESP PANEL BY RT-PCR (RSV, FLU A&B, COVID)  RVPGX2
Influenza A by PCR: NEGATIVE
Influenza B by PCR: NEGATIVE
Resp Syncytial Virus by PCR: NEGATIVE
SARS Coronavirus 2 by RT PCR: NEGATIVE

## 2024-05-30 LAB — HEMOGLOBIN A1C
Hgb A1c MFr Bld: 4.9 % (ref 4.8–5.6)
Mean Plasma Glucose: 93.93 mg/dL

## 2024-05-30 LAB — TSH: TSH: 8.728 u[IU]/mL — ABNORMAL HIGH (ref 0.350–4.500)

## 2024-05-30 LAB — BRAIN NATRIURETIC PEPTIDE: B Natriuretic Peptide: 750.8 pg/mL — ABNORMAL HIGH (ref 0.0–100.0)

## 2024-05-30 LAB — LACTIC ACID, PLASMA
Lactic Acid, Venous: 1.7 mmol/L (ref 0.5–1.9)
Lactic Acid, Venous: 1 mmol/L (ref 0.5–1.9)

## 2024-05-30 LAB — VITAMIN B12: Vitamin B-12: 292 pg/mL (ref 180–914)

## 2024-05-30 LAB — FERRITIN: Ferritin: 127 ng/mL (ref 11–307)

## 2024-05-30 MED ORDER — HYDROXYCHLOROQUINE SULFATE 200 MG PO TABS
200.0000 mg | ORAL_TABLET | Freq: Every day | ORAL | Status: DC
  Administered 2024-05-31 – 2024-06-08 (×9): 200 mg via ORAL
  Filled 2024-05-30 (×9): qty 1

## 2024-05-30 MED ORDER — INSULIN ASPART 100 UNIT/ML IJ SOLN
0.0000 [IU] | Freq: Three times a day (TID) | INTRAMUSCULAR | Status: DC
  Administered 2024-05-31 – 2024-06-02 (×2): 1 [IU] via SUBCUTANEOUS
  Administered 2024-06-03: 2 [IU] via SUBCUTANEOUS
  Administered 2024-06-06: 1 [IU] via SUBCUTANEOUS
  Filled 2024-05-30 (×3): qty 1

## 2024-05-30 MED ORDER — MIDODRINE HCL 5 MG PO TABS
10.0000 mg | ORAL_TABLET | Freq: Three times a day (TID) | ORAL | Status: AC
  Administered 2024-05-30: 10 mg via ORAL
  Filled 2024-05-30 (×2): qty 2

## 2024-05-30 MED ORDER — IPRATROPIUM-ALBUTEROL 0.5-2.5 (3) MG/3ML IN SOLN
3.0000 mL | Freq: Once | RESPIRATORY_TRACT | Status: AC
  Administered 2024-05-30: 3 mL via RESPIRATORY_TRACT
  Filled 2024-05-30: qty 3

## 2024-05-30 MED ORDER — LEVOTHYROXINE SODIUM 50 MCG PO TABS
150.0000 ug | ORAL_TABLET | Freq: Every day | ORAL | Status: DC
  Administered 2024-05-31 – 2024-06-08 (×9): 150 ug via ORAL
  Filled 2024-05-30 (×9): qty 1

## 2024-05-30 MED ORDER — PREDNISONE 10 MG PO TABS
5.0000 mg | ORAL_TABLET | Freq: Every day | ORAL | Status: DC
  Administered 2024-05-31 – 2024-06-08 (×9): 5 mg via ORAL
  Filled 2024-05-30 (×9): qty 1

## 2024-05-30 MED ORDER — SODIUM CHLORIDE 0.9 % IV BOLUS
1000.0000 mL | Freq: Once | INTRAVENOUS | Status: AC
  Administered 2024-05-30: 1000 mL via INTRAVENOUS

## 2024-05-30 MED ORDER — FUROSEMIDE 10 MG/ML IJ SOLN
40.0000 mg | Freq: Once | INTRAMUSCULAR | Status: AC
  Administered 2024-05-30: 40 mg via INTRAVENOUS
  Filled 2024-05-30: qty 4

## 2024-05-30 MED ORDER — BUDESONIDE 0.5 MG/2ML IN SUSP
0.5000 mg | Freq: Two times a day (BID) | RESPIRATORY_TRACT | Status: DC
  Administered 2024-05-30 – 2024-06-08 (×18): 0.5 mg via RESPIRATORY_TRACT
  Filled 2024-05-30 (×18): qty 2

## 2024-05-30 MED ORDER — RIVAROXABAN 20 MG PO TABS
20.0000 mg | ORAL_TABLET | Freq: Every morning | ORAL | Status: DC
  Administered 2024-05-31 – 2024-06-08 (×9): 20 mg via ORAL
  Filled 2024-05-30 (×10): qty 1

## 2024-05-30 MED ORDER — METHYLPREDNISOLONE SODIUM SUCC 125 MG IJ SOLR
125.0000 mg | Freq: Once | INTRAMUSCULAR | Status: AC
  Administered 2024-05-30: 125 mg via INTRAVENOUS
  Filled 2024-05-30: qty 2

## 2024-05-30 MED ORDER — AMIODARONE HCL 200 MG PO TABS
200.0000 mg | ORAL_TABLET | Freq: Every day | ORAL | Status: DC
  Administered 2024-05-31 – 2024-06-08 (×9): 200 mg via ORAL
  Filled 2024-05-30 (×9): qty 1

## 2024-05-30 MED ORDER — NICOTINE 14 MG/24HR TD PT24
14.0000 mg | MEDICATED_PATCH | Freq: Every day | TRANSDERMAL | Status: DC
  Filled 2024-05-30 (×3): qty 1

## 2024-05-30 MED ORDER — ATORVASTATIN CALCIUM 10 MG PO TABS
10.0000 mg | ORAL_TABLET | Freq: Every day | ORAL | Status: DC
  Administered 2024-05-30 – 2024-06-07 (×9): 10 mg via ORAL
  Filled 2024-05-30 (×9): qty 1

## 2024-05-30 MED ORDER — DILTIAZEM HCL ER COATED BEADS 180 MG PO CP24
360.0000 mg | ORAL_CAPSULE | Freq: Every day | ORAL | Status: DC
  Administered 2024-05-31 – 2024-06-04 (×5): 360 mg via ORAL
  Filled 2024-05-30 (×5): qty 2

## 2024-05-30 MED ORDER — MONTELUKAST SODIUM 10 MG PO TABS
10.0000 mg | ORAL_TABLET | Freq: Every day | ORAL | Status: DC
  Administered 2024-05-31 – 2024-06-08 (×9): 10 mg via ORAL
  Filled 2024-05-30 (×9): qty 1

## 2024-05-30 MED ORDER — NOREPINEPHRINE 4 MG/250ML-% IV SOLN
0.0000 ug/min | INTRAVENOUS | Status: DC
  Filled 2024-05-30: qty 250

## 2024-05-30 MED ORDER — SERTRALINE HCL 50 MG PO TABS
75.0000 mg | ORAL_TABLET | Freq: Every day | ORAL | Status: DC
  Administered 2024-05-31 – 2024-06-08 (×9): 75 mg via ORAL
  Filled 2024-05-30 (×9): qty 2

## 2024-05-30 NOTE — ED Provider Notes (Signed)
 [facility] Provider Note    None    (approximate)   History   Respiratory Distress  Pt in via [facility] from [facility] for respiratory distress. Pt normally on oxygen concentrator. Machine stopped working this morning and pt was changed to 2L [place] with an o2 tank. Unknown how long pt went without O2. Pt normally on bipap states the pt. Upon EMS arrival pt sating at 70%. Pt not alert enough for Cpap. Pt being bagged as she's brought into rm. Pt is Alert and responsive in rm. MD at bedside. Pt currently sating at 57% on [place]. RT at beside with Bipap.   HPI [name] is a 68 y.o. female  HFpEF, COPD and OSA with chronic hypoxic resp failure on bipap, A-fib on Xarelto , T2DM presents for evaluation of shortness of breath - Per EMS, they were called to patient's facility for shortness of breath.  She reportedly was cyanotic and unresponsive on their arrival.  Placed on nonrebreather.  Seems as if has become more interactive en route to hospital. - On arrival, patient does respond to basic questions.  Tells me she has not had her BiPAP for about 4 days and is feeling better now that she is on oxygen.  Denies any chest pain.      Physical Exam   BP (!) 89/62   Pulse 66   Temp 98.8 F (37.1 C)   Resp (!) 21   Ht 5' 3 (1.6 m)   Wt 109.2 kg   SpO2 94%   BMI 42.64 kg/m    Most recent vital signs: Vitals:   05/30/24 1515 05/30/24 1530  BP: (!) 82/61 (!) 89/62  Pulse: 66 66  Resp: 19 (!) 21  Temp:    SpO2: 94% 94%     General: Sleepy but arousable, +resp distress CV:  Good peripheral perfusion. RRR, RP 2+ Resp:  Next diminished breath sounds bilaterally, exam somewhat limited by habitus Abd:  No distention. Nontender to deep palpation throughout Neuro:  But arousable, interactive, answers basic questions  Bedside ultrasound with possible reduction in EF, dilated IVC, moderate B-lines in bilateral lung fields.   ED Results / Procedures /  Treatments   Labs (all labs ordered are listed, but only abnormal results are displayed) Labs Reviewed  COMPREHENSIVE METABOLIC PANEL WITH GFR - Abnormal; Notable for the following components:      Result Value   Sodium 133 (*)    Chloride 90 (*)    Glucose, Bld 144 (*)    BUN 31 (*)    Creatinine, Ser 1.22 (*)    Total Protein 6.4 (*)    Albumin 3.1 (*)    GFR, Estimated 49 (*)    All other components within normal limits  CBC WITH DIFFERENTIAL/PLATELET - Abnormal; Notable for the following components:   RBC 2.57 (*)    Hemoglobin 7.9 (*)    HCT 25.6 (*)    RDW 16.5 (*)    nRBC 1.5 (*)    Lymphs Abs 0.4 (*)    Abs Immature Granulocytes 0.49 (*)    All other components within normal limits  BLOOD GAS, VENOUS - Abnormal; Notable for the following components:   pCO2, Ven 81 (*)    Bicarbonate 42.7 (*)    Acid-Base Excess 13.0 (*)    All other components within normal limits  TROPONIN I (HIGH SENSITIVITY) - Abnormal; Notable for the following components:   Troponin I (High Sensitivity) 18 (*)  All other components within normal limits  RESP PANEL BY RT-PCR (RSV, FLU A&B, COVID)  RVPGX2  CULTURE, BLOOD (ROUTINE X 2)  CULTURE, BLOOD (ROUTINE X 2)  LACTIC ACID, PLASMA  BRAIN NATRIURETIC PEPTIDE  LACTIC ACID, PLASMA  TROPONIN I (HIGH SENSITIVITY)     EKG  See ED course below.   RADIOLOGY Radiology interpreted by myself and radiology report reviewed.  Concerning for CHF laceration.    PROCEDURES:  Critical Care performed: Yes, see critical care procedure note(s)  .Critical Care  Performed by: Collis Deaner, MD Authorized by: Collis Deaner, MD   Critical care provider statement:    Critical care time (minutes):  30   Critical care time was exclusive of:  Separately billable procedures and treating other patients   Critical care was necessary to treat or prevent imminent or life-threatening deterioration of the following conditions:  Respiratory failure    Critical care was time spent personally by me on the following activities:  Development of treatment plan with patient or surrogate, discussions with consultants, evaluation of patient's response to treatment, examination of patient, ordering and review of laboratory studies, ordering and review of radiographic studies, ordering and performing treatments and interventions, pulse oximetry, re-evaluation of patient's condition and review of old charts   I assumed direction of critical care for this patient from another provider in my specialty: no     Care discussed with: admitting provider      MEDICATIONS ORDERED IN ED: Medications  norepinephrine (LEVOPHED) 4mg  in (0.016 mg/mL) premix infusion (has no administration in time range)  ipratropium-albuterol  (DUONEB) 0.5-2.5 (3) MG/3ML nebulizer solution 3 mL (3 mLs Nebulization Given 05/30/24 1437)  sodium chloride  0.9 % bolus 1,000 mL (0 mLs Intravenous Stopped 05/30/24 1543)  methylPREDNISolone sodium succinate (SOLU-MEDROL) 125 mg/2 mL injection 125 mg (125 mg Intravenous Given 05/30/24 1441)  ipratropium-albuterol  (DUONEB) 0.5-2.5 (3) MG/3ML nebulizer solution 3 mL (3 mLs Nebulization Given 05/30/24 1517)  ipratropium-albuterol  (DUONEB) 0.5-2.5 (3) MG/3ML nebulizer solution 3 mL (3 mLs Nebulization Given 05/30/24 1517)     IMPRESSION / MDM / ASSESSMENT AND PLAN / ED COURSE  I reviewed the triage vital signs and the nursing notes.                              DDX/MDM/AP: Differential diagnosis includes, but is not limited to, respiratory failure in the setting of not having availability of patient's baseline oxygen and BiPAP, high concern for hypercarbia as well as her obvious hypoxia for EMS and initial eval here.  Suspect underlying CHF and COPD likely contributing.  Consider pneumonia.  Doubt ACS.  Already anticoagulated, do not suspect underlying PE at this time.  Less likely infectious etiology though will monitor closely.  Plan: -  BiPAP - DuoNeb - EKG - Chest x-ray - Small bolus IV fluid  Patient's presentation is most consistent with acute presentation with potential threat to life or bodily function.  The patient is on the cardiac monitor to evaluate for evidence of arrhythmia and/or significant heart rate changes.  ED course below.  Workup notable for significant hypercarbia.  Fortunately mental status continues to improve here on BiPAP.  Initially somewhat hypotensive, waking up MAP improved to greater than 50 after about 500 cc IV fluid, deferring further fluid in the setting of likely underlying CHF exacerbation per my bedside ultrasound as well as chest x-ray.  Improving with DuoNebs.  Levophed bedside does not given,  can use as needed.  No clear evidence of underlying infection at this time.  Admitted to hospitalist service.  Clinical Course as of 05/30/24 1554  Sun May 30, 2024  1433 pCO2, Ven(!!): 81 [MM]  1448 CBC with anemia essentially in baseline range [MM]  1448 CXR: IMPRESSION: 1. Cardiomegaly with pulmonary vascular congestion. 2. Interstitial prominence bilaterally with mild airspace disease at the lung bases, possible edema versus infection. 3. Possible small bilateral pleural effusions.   [MM]  1457 CMP with mild hyponatremia, mild bump in creatinine [MM]  1457 Lactate wnl [MM]  1533 Troponin mildly elevated, suspect type II NSTEMI in the setting of CHF/COPD exacerbation  Hospitalist consult order placed [MM]  1533 Ecg = sinus rhythm, right bundle branch block, no gross ST elevation or depression, no significant repolarization abnormality, normal axis, normal intervals.  No clear evidence of ischemia on my interpretation. [MM]    Clinical Course User Index [MM] Collis Deaner, MD     FINAL CLINICAL IMPRESSION(S) / ED DIAGNOSES   Final diagnoses:  Acute respiratory failure with hypoxia and hypercapnia (HCC)  Acute on chronic congestive heart failure, unspecified heart failure type  (HCC)  COPD exacerbation (HCC)     Rx / DC Orders   ED Discharge Orders     None        Note:  This document was prepared using Dragon voice recognition software and may include unintentional dictation errors.   Collis Deaner, MD 05/30/24 [phone]

## 2024-05-30 NOTE — ED Notes (Signed)
 Advised nurse that patient has ready bed

## 2024-05-30 NOTE — ED Triage Notes (Addendum)
 Pt in via [facility] from [facility] for respiratory distress. Pt normally on oxygen concentrator. Machine stopped working this morning and pt was changed to 2L [place] with an o2 tank. Unknown how long pt went without O2. Pt normally on bipap states the pt. Upon EMS arrival pt sating at 70%. Pt not alert enough for Cpap. Pt being bagged as she's brought into rm. Pt is Alert and responsive in rm. MD at bedside. Pt currently sating at 57% on . RT at beside with Bipap.

## 2024-05-30 NOTE — H&P (Signed)
 History and Physical    Patient: [name]:[id number] DOB: January 11, 1956 DOA: 05/30/2024 DOS: the patient was seen and examined on 05/30/2024 PCP: [name], PA-C  Patient coming from: SNF  Chief Complaint:  Chief Complaint  Patient presents with   Respiratory Distress   HPI: [name] is a 68 y.o. female with medical history significant of COPD, rheumatoid arthritis, obesity, CHF, pulmonary embolism, type 2 diabetes, DVT, hypertension, hyperlipidemia, hypothyroidism, chronic respiratory failure on 6 L [place] and nighttime BiPAP, nicotine dependence, atrial fibrillation status post cardioversion May 2025, currently on amiodarone  and Xarelto .  Patient was recently hospitalized 5/5 through 5/16 at [facility] for multifactorial respiratory failure related to CHF and pneumonia.  She was admitted to [facility] on 6/6 through 6/9 due to respiratory failure and epistaxis.  Patient reports that her home BiPAP was broken causing inadequate oxygen at home.  Admission was also complicated by epistaxis secondary to nasal cannula and DOAC. On this admission patient presents obtunded and hypoxic into the 50s.  Reportedly there was issue with patient's breathing device at her living facility and she was placed on oxygen tank which apparently ran out for a prolonged period of time without staff knowledge.  On arrival to the ED patient was hypoxic in the 50%.  Patient required bagging but was alert and responsive.  Patient was placed on BiPAP.  And had improvement. Initial pCO2 81, chest x-ray with interstitial prominence bilaterally and bilateral pleural effusions.  CMP revealed bump in creatinine, hyponatremia, mildly elevated troponin.  Patient was also found to be hypotensive but fluid responsive after 500 cc NS. We were asked to admit the patient for further workup.  On my evaluation patient is on BiPAP.  O2 sats consistently in the 90s, blood pressure has improved significantly.  MAP now easily above  65.  Patient is very drowsy but is able to nod head yes and no.  She is following some commands.  She is not able to provide history or background.  She does nod that she has not recently had fever or felt sick.  She is not able to explain what happened to her BiPAP. She denies chest pain.  Per discussion with the patient's daughter-in-law, Bridgette Campus, they have been working to get the patient her desired BiPAP mask at [employer] but have been unable to get the one that the patient likes.  Because of this, the patient has been refusing to wear her BiPAP at night.  Review of Systems: ROS limited due to patient's mentation. 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    HTN (hypertension)    Hypercholesteremia    Hypothyroid    Rheumatoid arthritis(714.0)    Varicose vein of leg    Past Surgical History:  Procedure Laterality Date   APPENDECTOMY     CHOLECYSTECTOMY     ELBOW SURGERY     LEFT   ENDOSCOPIC VEIN LASER TREATMENT     RIGHT LEG   EXPLORATORY LAPAROTOMY     HAND SURGERY     RIGHT    HEEL SPUR SURGERY     BILAT   INSERTION OF MESH N/A 08/04/2017   Procedure: INSERTION OF MESH;  Surgeon: Oralee Billow, MD;  Location: [facility]  OR;  Service: General;  Laterality: N/A;   partial thyroidectomy  2015   TONSILLECTOMY     TUBAL LIGATION     VENTRAL HERNIA REPAIR N/A 08/04/2017   Procedure: LAPAROSCOPIC VENTRAL INCISIONAL HERNIA REPAIR WITH MESH;  Surgeon: Oralee Billow, MD;  Location: [facility] OR;  Service: General;  Laterality: N/A;   Social History:  reports that she quit smoking about 4 years ago. Her smoking use included cigarettes. She started smoking about 44 years ago. She has a 40 pack-year smoking history. She has never used smokeless tobacco. She reports that she does not drink alcohol and does  not use drugs.  Allergies  Allergen Reactions   Oxycodone -Acetaminophen  Nausea Only and Other (See Comments)    Violent Vomiting Violent Vomiting   Penicillins Anaphylaxis, Swelling and Rash    Has patient had a PCN reaction causing immediate rash, facial/tongue/throat swelling, SOB or lightheadedness with hypotension: Yes Has patient had a PCN reaction causing severe rash involving mucus membranes or skin necrosis: No Has patient had a PCN reaction that required hospitalization No Has patient had a PCN reaction occurring within the last 10 years: No If all of the above answers are NO, then may proceed with Cephalosporin use.    Latex Hives and Itching    Blisters (also)   Oxycodone -Acetaminophen  Nausea And Vomiting   Tyloxapol Nausea And Vomiting   Adhesive [Tape] Rash    Patient prefers paper tape   Strawberry Extract Hives, Itching and Rash   Wound Dressing Adhesive Dermatitis    Family History  Problem Relation Age of Onset   Diabetes Mother    Kidney disease Mother    Lung cancer Father    Alzheimer's disease Maternal Grandmother    Stroke Maternal Grandfather    Heart disease Paternal Grandmother    Unexplained death Paternal Grandfather     Prior to Admission medications   Medication Sig Start Date End Date Taking? Authorizing Provider  acetaminophen  (TYLENOL ) 500 MG tablet Take 500 mg by mouth every 8 (eight) hours as needed for mild pain (pain score 1-3).    [provider]  amiodarone  (PACERONE ) 200 MG tablet Take 200 mg by mouth daily.    [provider]  atorvastatin (LIPITOR) 10 MG tablet Take 10 mg by mouth at bedtime.    [provider]  benzonatate (TESSALON) 100 MG capsule Take 100 mg by mouth every 8 (eight) hours as needed for cough.    [provider]  budesonide  (PULMICORT ) 0.5 MG/2ML nebulizer solution Take 0.5 mg by nebulization 2 (two) times daily.    [provider]  Cyanocobalamin 1000 MCG TBCR Take 1  tablet by mouth daily.    [provider]  diltiazem  (CARDIZEM  CD) 360 MG 24 hr capsule Take 360 mg by mouth daily.    [provider]  ferrous sulfate 325 (65 FE) MG EC tablet Take 325 mg by mouth daily with breakfast.    [provider]  folic acid  (FOLVITE ) 1 MG tablet Take 1 mg by mouth daily.    [provider]  furosemide  (LASIX ) 80 MG tablet Take 1 tablet (80 mg total) by mouth daily. 05/24/24   Wouk, Haynes Lips, MD  gabapentin  (NEURONTIN ) 400 MG capsule Take 400 mg by mouth 3 (three) times daily.    [provider]  hydroxychloroquine  (PLAQUENIL ) 200 MG tablet Take 200 mg by mouth daily.    [provider]  insulin  lispro (HUMALOG) 100 UNIT/ML injection Inject 2-10 Units into the  skin 3 (three) times daily before meals. Sliding scale    [provider]  ipratropium-albuterol  (DUONEB) 0.5-2.5 (3) MG/3ML SOLN Take 3 mLs by nebulization every 6 (six) hours.    [provider]  levothyroxine  (SYNTHROID ) 150 MCG tablet Take 150 mcg by mouth daily before breakfast.    [provider]  melatonin 5 MG TABS Take 5 mg by mouth at bedtime.    [provider]  montelukast (SINGULAIR) 10 MG tablet Take 10 mg by mouth daily.    [provider]  Multiple Vitamin (MULTIVITAMIN) tablet Take 1 tablet by mouth daily.    [provider]  naloxone [facility]) nasal spray 4 mg/0.1 mL Place 1 spray into the nose 3 (three) times daily as needed (symptoms of opiod overdose).    [provider]  oxymetazoline  (AFRIN) 0.05 % nasal spray Place 1 spray into both nostrils 2 (two) times daily. 05/24/24   Wouk, Haynes Lips, MD  pantoprazole  (PROTONIX ) 40 MG tablet Take 40 mg by mouth daily.    [provider]  predniSONE  (DELTASONE ) 5 MG tablet Take 5 mg by mouth daily with breakfast.    [provider]  rivaroxaban  (XARELTO ) 20 MG TABS tablet Take 20 mg by mouth in the morning.    [provider]  sennosides-docusate sodium  (SENOKOT-S) 8.6-50 MG tablet Take 2 tablets by mouth 2 (two) times daily.    [provider]  sertraline  (ZOLOFT ) 25 MG tablet Take 25 mg by mouth daily. Takes with 50mg  tab = 75mg  daily    [provider]  sertraline  (ZOLOFT ) 50 MG tablet Take 50 mg by mouth daily. Takes with 25mg  tab = 75mg  daily    [provider]  traMADol  (ULTRAM ) 50 MG tablet Take 1 tablet (50 mg total) by mouth every 8 (eight) hours as needed for moderate pain (pain score 4-6). 05/24/24   Janeane Mealy, MD    Physical Exam: Vitals:   05/30/24 1445 05/30/24 1500 05/30/24 1515 05/30/24 1530  BP: 94/60 (!) 87/62 (!) 82/61 (!) 89/62  Pulse: 69 68 66 66  Resp: (!) 21 (!) 22 19 (!) 21  Temp:      SpO2: 95% 96% 94% 94%  Weight:      Height:       Constitutional:  Normal appearance. Non toxic-appearing.  Obese HENT: Head Normocephalic and atraumatic.  Mucous membranes are moist.  Eyes:  Extraocular intact. Conjunctivae normal. Pupils are equal, round, and reactive to light.  Cardiovascular: Rate and Rhythm: Normal rate and regular rhythm.  Pulmonary: Currently on BiPAP therapy, no audible wheezing, some crackles but shallow respirations limiting exam. Musculoskeletal:  Normal range of motion.  Skin: warm and dry. not jaundiced.  Neurological: Follows commands, nods head yes and no.  Somnolent but arousable  Data Reviewed:  CXR reviewed.    Latest Ref Rng & Units 05/30/2024    2:25 PM 05/24/2024    9:23 AM 05/24/2024    3:47 AM  CBC  WBC 4.0 - 10.5 K/uL 7.9  7.7  6.8   Hemoglobin 12.0 - 15.0 g/dL 7.9  9.2  8.4   Hematocrit 36.0 - 46.0 % 25.6  30.3  27.6   Platelets 150 - 400 K/uL 224  94  91       Latest Ref Rng & Units 05/30/2024    2:25 PM 05/24/2024    3:47 AM 05/23/2024    4:48 AM  CMP  Glucose 70 - 99 mg/dL 147  103  91   BUN 8 - 23 mg/dL 31  31  22    Creatinine 0.44 - 1.00 mg/dL 1.47  8.29  5.62   Sodium 135 - 145 mmol/L 133  138  138    Potassium 3.5 - 5.1 mmol/L 3.9  3.5  3.5   Chloride 98 - 111 mmol/L 90  90  94   CO2 22 - 32 mmol/L 30  37  34   Calcium  8.9 - 10.3 mg/dL 9.3  8.8  8.4   Total Protein 6.5 - 8.1 g/dL 6.4     Total Bilirubin 0.0 - 1.2 mg/dL 0.8     Alkaline Phos 38 - 126 U/L 59     AST 15 - 41 U/L 19     ALT 0 - 44 U/L 19        Assessment and Plan: Acute on chronic hypoxic and hypercapnic respiratory failure - Patient meant to be on 6 L O2 chronically with BiPAP at night.  Appears there is some difficulty at nursing facility today and she was off oxygen for an unknown amount of time - pCO2 on arrival: 81, pH 7.3. - For now continue with BiPAP - May be able to transition to high flow nasal cannula patient is more alert -CXR with congestion bilaterally.  Blood pressure appears to be stabilizing now.  MAP is in the 80s.  Will trial 40 mg IV Lasix  push x 1 and monitor for response. - Respiratory viral panel negative - Blood cultures obtained, will follow - No lactic acidosis, no WBC - pH is compensated - Wean oxygen as tolerated - Status post breathing treatments in ED - Will continue with scheduled DuoNebs - No significant wheezing appreciated.  Status post Methylpred 125 in ED - Consider additional steroids if needed.  Does not presently appear to be in COPD exacerbation.  Appears to all be result of prolonged time.  Without oxygen  Hypotension - Received 500 cc NS in ED.  Blood pressure responding now - May require midodrine to allow for diuresis  Hyponatremia - Will trend.  Status post 500 cc NS  AKI - Baseline creatinine around 0.8, elevated to 1.22 - Status post 500 cc NS - Trend CMP - Renally dosed with a creatinine clearance 53 when needed - Strict I's and O's  Normocytic anemia Hemoglobin 7.9 - Baseline appears closer to 8.5-9. - Patient unable to elaborate on history.  Unable to elicit if she is experiencing melena or hematochezia.  Will obtain FOBT. - Iron panel  ordered.  Mildly elevated troponin - Troponin 18 on arrival -> 21.  Denies chest pain.  Suspect this is demand ischemia given profound hypoxia on arrival  CHF - No recent echocardiogram seen - Vascular congestion seen on CXR - No lower extremity edema -- BNP 750 - Will repeat on this admission - Strict I's and O's - Would benefit from diuresis given congestion on CXR. Lasix  x1 ordered. Assess response  COPD - Chronically on 6 L O2 - DuoNebs and Methylpred as above  BMI 42 Obesity -- Some component of OHS playing a role in chronic respiratory failure as above - Outpatient follow up for lifestyle modification and risk factor management  Rheumatoid arthritis - Resume home meds  History of pulmonary embolism History of DVT - Continue home dose Xarelto   Hypothyroidism - Continue home dose levothyroxine   Hyperlipidemia - Continue statin  Nicotine dependence - Order nicotine patch  Atrial fibrillation - Status post cardioversion May 2025 -  Continue amiodarone , dilt, and Xarelto  for now    Advance Care Planning: Was unable to discuss CODE STATUS with the patient due to BiPAP and somnolence.  For now we will make full code.  Will reassess daily and discuss advanced care planning patient is alert and able. Patient's daughter in law reports intubation is last resort but does consent if needed.  Consults: n/a  Family Communication: Daughter in Social worker, Bridgette Campus  Severity of Illness: The appropriate patient status for this patient is OBSERVATION. Observation status is judged to be reasonable and necessary in order to provide the required intensity of service to ensure the patient's safety. The patient's presenting symptoms, physical exam findings, and initial radiographic and laboratory data in the context of their medical condition is felt to place them at decreased risk for further clinical deterioration. Furthermore, it is anticipated that the patient will be medically stable  for discharge from the hospital within 2 midnights of admission.   Author: Kojo Liby, DO 05/30/2024 3:52 PM  For on call review [email or web address].

## 2024-05-31 ENCOUNTER — Observation Stay

## 2024-05-31 ENCOUNTER — Observation Stay
Admit: 2024-05-31 | Discharge: 2024-05-31 | Disposition: A | Discharge status: Home / Self Care | Attending: Family Medicine

## 2024-05-31 ENCOUNTER — Inpatient Hospital Stay
Admit: 2024-05-31 | Discharge: 2024-05-31 | Disposition: A | Discharge status: Home / Self Care | Attending: Family Medicine | Admitting: Family Medicine

## 2024-05-31 DIAGNOSIS — J9622 Acute and chronic respiratory failure with hypercapnia: Secondary | ICD-10-CM | POA: Diagnosis present

## 2024-05-31 DIAGNOSIS — J9601 Acute respiratory failure with hypoxia: Secondary | ICD-10-CM | POA: Diagnosis not present

## 2024-05-31 DIAGNOSIS — Z1152 Encounter for screening for COVID-19: Secondary | ICD-10-CM | POA: Diagnosis not present

## 2024-05-31 DIAGNOSIS — E871 Hypo-osmolality and hyponatremia: Secondary | ICD-10-CM | POA: Diagnosis present

## 2024-05-31 DIAGNOSIS — M069 Rheumatoid arthritis, unspecified: Secondary | ICD-10-CM | POA: Diagnosis present

## 2024-05-31 DIAGNOSIS — Z794 Long term (current) use of insulin: Secondary | ICD-10-CM | POA: Diagnosis not present

## 2024-05-31 DIAGNOSIS — J441 Chronic obstructive pulmonary disease with (acute) exacerbation: Secondary | ICD-10-CM | POA: Diagnosis present

## 2024-05-31 DIAGNOSIS — I4891 Unspecified atrial fibrillation: Secondary | ICD-10-CM | POA: Diagnosis present

## 2024-05-31 DIAGNOSIS — F17200 Nicotine dependence, unspecified, uncomplicated: Secondary | ICD-10-CM | POA: Diagnosis present

## 2024-05-31 DIAGNOSIS — I878 Other specified disorders of veins: Secondary | ICD-10-CM | POA: Diagnosis present

## 2024-05-31 DIAGNOSIS — D67 Hereditary factor IX deficiency: Secondary | ICD-10-CM | POA: Diagnosis not present

## 2024-05-31 DIAGNOSIS — I959 Hypotension, unspecified: Secondary | ICD-10-CM | POA: Diagnosis present

## 2024-05-31 DIAGNOSIS — J9621 Acute and chronic respiratory failure with hypoxia: Secondary | ICD-10-CM | POA: Diagnosis present

## 2024-05-31 DIAGNOSIS — E440 Moderate protein-calorie malnutrition: Secondary | ICD-10-CM | POA: Diagnosis present

## 2024-05-31 DIAGNOSIS — E7800 Pure hypercholesterolemia, unspecified: Secondary | ICD-10-CM | POA: Diagnosis present

## 2024-05-31 DIAGNOSIS — E66813 Obesity, class 3: Secondary | ICD-10-CM | POA: Diagnosis present

## 2024-05-31 DIAGNOSIS — J439 Emphysema, unspecified: Secondary | ICD-10-CM | POA: Diagnosis present

## 2024-05-31 DIAGNOSIS — G4733 Obstructive sleep apnea (adult) (pediatric): Secondary | ICD-10-CM | POA: Diagnosis present

## 2024-05-31 DIAGNOSIS — G8929 Other chronic pain: Secondary | ICD-10-CM | POA: Diagnosis present

## 2024-05-31 DIAGNOSIS — D638 Anemia in other chronic diseases classified elsewhere: Secondary | ICD-10-CM | POA: Diagnosis present

## 2024-05-31 DIAGNOSIS — J9692 Respiratory failure, unspecified with hypercapnia: Secondary | ICD-10-CM | POA: Diagnosis present

## 2024-05-31 DIAGNOSIS — Z6841 Body Mass Index (BMI) 40.0 and over, adult: Secondary | ICD-10-CM | POA: Diagnosis not present

## 2024-05-31 DIAGNOSIS — E1140 Type 2 diabetes mellitus with diabetic neuropathy, unspecified: Secondary | ICD-10-CM | POA: Diagnosis present

## 2024-05-31 DIAGNOSIS — I5032 Chronic diastolic (congestive) heart failure: Secondary | ICD-10-CM | POA: Diagnosis present

## 2024-05-31 DIAGNOSIS — N179 Acute kidney failure, unspecified: Secondary | ICD-10-CM | POA: Diagnosis present

## 2024-05-31 DIAGNOSIS — I110 Hypertensive heart disease with heart failure: Secondary | ICD-10-CM | POA: Diagnosis present

## 2024-05-31 LAB — CBC WITH DIFFERENTIAL/PLATELET
Abs Immature Granulocytes: 0.34 10*3/uL — ABNORMAL HIGH (ref 0.00–0.07)
Basophils Absolute: 0 10*3/uL (ref 0.0–0.1)
Basophils Relative: 0 %
Eosinophils Absolute: 0 10*3/uL (ref 0.0–0.5)
Eosinophils Relative: 0 %
HCT: 25 % — ABNORMAL LOW (ref 36.0–46.0)
Hemoglobin: 7.6 g/dL — ABNORMAL LOW (ref 12.0–15.0)
Immature Granulocytes: 6 %
Lymphocytes Relative: 5 %
Lymphs Abs: 0.3 10*3/uL — ABNORMAL LOW (ref 0.7–4.0)
MCH: 29.6 pg (ref 26.0–34.0)
MCHC: 30.4 g/dL (ref 30.0–36.0)
MCV: 97.3 fL (ref 80.0–100.0)
Monocytes Absolute: 0.1 10*3/uL (ref 0.1–1.0)
Monocytes Relative: 2 %
Neutro Abs: 5.2 10*3/uL (ref 1.7–7.7)
Neutrophils Relative %: 87 %
Platelets: 245 10*3/uL (ref 150–400)
RBC: 2.57 MIL/uL — ABNORMAL LOW (ref 3.87–5.11)
RDW: 16.6 % — ABNORMAL HIGH (ref 11.5–15.5)
Smear Review: NORMAL
WBC: 6 10*3/uL (ref 4.0–10.5)
nRBC: 1 % — ABNORMAL HIGH (ref 0.0–0.2)

## 2024-05-31 LAB — ECHOCARDIOGRAM COMPLETE
AR max vel: 7.02 cm2
AV Area VTI: 6.77 cm2
AV Area mean vel: 6.44 cm2
AV Mean grad: 4.5 mmHg
AV Peak grad: 7.2 mmHg
Ao pk vel: 1.34 m/s
Area-P 1/2: 3.76 cm2
Height: 63 in
MV VTI: 4.06 cm2
S' Lateral: 3.5 cm
Weight: 3851.2 [oz_av]

## 2024-05-31 LAB — COMPREHENSIVE METABOLIC PANEL WITH GFR
ALT: 19 U/L (ref 0–44)
AST: 17 U/L (ref 15–41)
Albumin: 3.1 g/dL — ABNORMAL LOW (ref 3.5–5.0)
Alkaline Phosphatase: 57 U/L (ref 38–126)
Anion gap: 12 (ref 5–15)
BUN: 35 mg/dL — ABNORMAL HIGH (ref 8–23)
CO2: 31 mmol/L (ref 22–32)
Calcium: 9.1 mg/dL (ref 8.9–10.3)
Chloride: 93 mmol/L — ABNORMAL LOW (ref 98–111)
Creatinine, Ser: 1.37 mg/dL — ABNORMAL HIGH (ref 0.44–1.00)
GFR, Estimated: 43 mL/min — ABNORMAL LOW (ref >60)
Glucose, Bld: 135 mg/dL — ABNORMAL HIGH (ref 70–99)
Potassium: 4.3 mmol/L (ref 3.5–5.1)
Sodium: 136 mmol/L (ref 135–145)
Total Bilirubin: 0.6 mg/dL (ref 0.0–1.2)
Total Protein: 6.4 g/dL — ABNORMAL LOW (ref 6.5–8.1)

## 2024-05-31 LAB — GLUCOSE, CAPILLARY
Glucose-Capillary: 156 mg/dL — ABNORMAL HIGH (ref 70–99)
Glucose-Capillary: 137 mg/dL — ABNORMAL HIGH (ref 70–99)
Glucose-Capillary: 147 mg/dL — ABNORMAL HIGH (ref 70–99)
Glucose-Capillary: 173 mg/dL — ABNORMAL HIGH (ref 70–99)

## 2024-05-31 LAB — MRSA NEXT GEN BY PCR, NASAL: MRSA by PCR Next Gen: NOT DETECTED

## 2024-05-31 LAB — MAGNESIUM: Magnesium: 2.1 mg/dL (ref 1.7–2.4)

## 2024-05-31 LAB — PHOSPHORUS: Phosphorus: 5 mg/dL — ABNORMAL HIGH (ref 2.5–4.6)

## 2024-05-31 NOTE — Progress Notes (Signed)
*  PRELIMINARY RESULTS* Echocardiogram 2D Echocardiogram has been performed.  [name] 05/31/2024, 2:49 PM

## 2024-05-31 NOTE — Plan of Care (Signed)

## 2024-05-31 NOTE — Progress Notes (Signed)
 PROGRESS NOTE    [name]  BJY:[id number] DOB: 09-18-1956 DOA: 05/30/2024 PCP: [name], PA-C  Chief Complaint  Patient presents with   Respiratory Distress    Hospital Course:  [name] is a 68 y.o. female with medical history significant of COPD, rheumatoid arthritis, obesity, CHF, pulmonary embolism, type 2 diabetes, DVT, hypertension, hyperlipidemia, hypothyroidism, chronic respiratory failure on 6 L [place] and nighttime BiPAP, nicotine dependence, atrial fibrillation status post cardioversion May 2025, currently on amiodarone  and Xarelto .  Patient was recently hospitalized 5/5 through 5/16 at [facility] for multifactorial respiratory failure related to CHF and pneumonia.  She was admitted to [facility] on 6/6 through 6/9 due to respiratory failure and epistaxis.  Patient reports that her home BiPAP was broken causing inadequate oxygen at home.  Admission was also complicated by epistaxis secondary to nasal cannula and DOAC. On this admission patient presents obtunded and hypoxic into the 50s.  Reportedly there was issue with patient's breathing device at her living facility and she was placed on oxygen tank which apparently ran out for a prolonged period of time without staff knowledge.  On arrival to the ED patient was hypoxic in the 50%.  Patient required bagging but was alert and responsive.  Patient was placed on BiPAP.  And had improvement. Initial pCO2 81, chest x-ray with interstitial prominence bilaterally and bilateral pleural effusions.  CMP revealed bump in creatinine, hyponatremia, mildly elevated troponin.  Patient was also found to be hypotensive but fluid responsive after 500 cc NS.  We were asked to admit the patient for further workup.  Subjective: This morning patient was able to transition to high flow nasal cannula.  She reports feeling much better.  She reports that the nursing facility does not have the BiPAP mask that she can tolerate and thus she has not been  wearing it.  She does not recall the events that led up to her hospitalization yesterday.   Objective: Vitals:   05/31/24 0728 05/31/24 0813 05/31/24 0854 05/31/24 1255  BP:   98/61 109/71  Pulse:   75 69  Resp:   (!) 22 (!) 21  Temp:   99.1 F (37.3 C) 98 F (36.7 C)  TempSrc:   Oral Oral  SpO2: 98% 98% 100% 100%  Weight:      Height:        Intake/Output Summary (Last 24 hours) at 05/31/2024 1624 Last data filed at 05/31/2024 0600 Gross per 24 hour  Intake 240 ml  Output 500 ml  Net -260 ml   Filed Weights   05/30/24 1434  Weight: 109.2 kg    Examination: General exam: Appears calm and comfortable, NAD, obese. Respiratory system: No work of breathing, symmetric chest wall expansion. HF Nasal cannula in place.  No crackles appreciated.  Better aeration bilaterally Cardiovascular system: S1 & S2 heard, RRR.  Gastrointestinal system: Abdomen is nondistended, soft and nontender. No lower extremity edema Neuro: Alert and oriented. No focal neurological deficits. Extremities: Symmetric, expected ROM Skin: No rashes, lesions Psychiatry: Demonstrates appropriate judgement and insight. Mood & affect appropriate for situation.   Assessment & Plan:  Principal Problem:   Acute hypoxic respiratory failure (HCC) Active Problems:   Hypercapnic respiratory failure (HCC)    Acute on chronic hypoxic and hypercapnic respiratory failure - Patient meant to be on 6 L O2 chronically with BiPAP at night.  BiPAP mask has not been working at SNF - pCO2 on arrival: 81, pH 7.3. - Titrated off of BiPAP  this morning.  Now tolerating high flow nasal cannula - CXR with congestion bilaterally, received one-time 40 mg IV push Lasix  yesterday with significant improvement on chest x-ray today - Will continue to wean oxygen as tolerated - Patient does not follow with pulmonology - Has not been taking her inhalers consistently as she reports she may not be getting them as prescribed - Respiratory  viral panel negative - Blood cultures obtained, negative to date will follow - No lactic acidosis, no WBC - pH compensated - Continue with breathing treatments - Status post Methylpred 125 in ED, I do not suspect that this is COPD exacerbation.  This appears to be all secondary to prolonged downtime without oxygen.  No antibiotics or further steroids indicated at this time   Hypotension - Received 500 cc NS in ED.  Blood pressure responding now - May require midodrine for diuresis if needed   Hyponatremia - Resolved - Trend CMP   AKI - Baseline creatinine around 0.8, elevated to 1.22 on arrival.  Received 40 mg IV push Lasix  yesterday with subsequent increase in creatinine - Patient has pulmonary edema but clinically appears dry with elevated BUN and no lower extremity edema - Will continue to trend CMP - Renally dose with a creatinine clearance 47 - strict I's and O's   Normocytic anemia Hemoglobin 7.6 - Baseline appears closer to 8.5-9. - Patient denies melena or hematochezia.  FOBT ordered - Iron, TIBC, ferritin WNL.  Folate within normal limits, B12 within normal limits.   Mildly elevated troponin - Troponin 18 on arrival -> 21.  Denies chest pain.  Suspect this is demand ischemia given profound hypoxia on arrival   CHF - No recent echocardiogram seen - Vascular congestion seen on CXR though no peripheral edema appreciated - Patient endorses that she does not feel volume overloaded - BNP 750 - Echocardiogram ordered, results pending - Continue to follow strict I's and O's - Will hold further diuresis given AKI - Monitor volume status closely  Advanced COPD - Chronically on 6 L O2, BiPAP at night. - Budesonide .  On daily prednisone  and montelukast therapy.  Continue.   BMI 42 Obesity -- Some component of OHS playing a role in chronic respiratory failure as above - Outpatient follow up for lifestyle modification and risk factor management   Rheumatoid arthritis -  Resume home meds   History of pulmonary embolism History of DVT - Continue home dose Xarelto    Hypothyroidism - Continue home dose levothyroxine    Hyperlipidemia - Continue statin   Nicotine dependence - Order nicotine patch   Atrial fibrillation - Status post cardioversion May 2025 - Continue amiodarone , dilt, and Xarelto  for now  DVT prophylaxis: Xarelto    Code Status: Full Code Disposition: Admit inpatient.  Still being treated for hypoxic and hypercapnic respiratory failure and AKI.  Will eventually discharge to skilled nursing facility when we can assure that they have BiPAP and oxygen needs.  Consultants:    Procedures:    Antimicrobials:  Anti-infectives (From admission, onward)    Start     Dose/Rate Route Frequency Ordered Stop   05/31/24 1000  hydroxychloroquine  (PLAQUENIL ) tablet 200 mg        200 mg Oral Daily 05/30/24 1735         Data Reviewed: I have personally reviewed following labs and imaging studies CBC: Recent Labs  Lab 05/30/24 1425 05/31/24 0421  WBC 7.9 6.0  NEUTROABS 6.1 5.2  HGB 7.9* 7.6*  HCT 25.6* 25.0*  MCV 99.6  97.3  PLT 224 245   Basic Metabolic Panel: Recent Labs  Lab 05/30/24 1425 05/31/24 0421  NA 133* 136  K 3.9 4.3  CL 90* 93*  CO2 30 31  GLUCOSE 144* 135*  BUN 31* 35*  CREATININE 1.22* 1.37*  CALCIUM  9.3 9.1  MG  --  2.1  PHOS  --  5.0*   GFR: Estimated Creatinine Clearance: 47.9 mL/min (A) (by C-G formula based on SCr of 1.37 mg/dL (H)). Liver Function Tests: Recent Labs  Lab 05/30/24 1425 05/31/24 0421  AST 19 17  ALT 19 19  ALKPHOS 59 57  BILITOT 0.8 0.6  PROT 6.4* 6.4*  ALBUMIN 3.1* 3.1*   CBG: Recent Labs  Lab 05/24/24 1627 05/30/24 2044 05/31/24 0850 05/31/24 1253 05/31/24 1618  GLUCAP 155* 150* 156* 137* 147*    Recent Results (from the past 240 hours)  Resp panel by RT-PCR (RSV, Flu A&B, Covid) Anterior Nasal Swab     Status: None   Collection Time: 05/30/24  2:25 PM    Specimen: Anterior Nasal Swab  Result Value Ref Range Status   SARS Coronavirus 2 by RT PCR NEGATIVE NEGATIVE Final    Comment: (NOTE) SARS-CoV-2 target nucleic acids are NOT DETECTED.  The SARS-CoV-2 RNA is generally detectable in upper respiratory specimens during the acute phase of infection. The lowest concentration of SARS-CoV-2 viral copies this assay can detect is 138 copies/mL. A negative result does not preclude SARS-Cov-2 infection and should not be used as the sole basis for treatment or other patient management decisions. A negative result may occur with  improper specimen collection/handling, submission of specimen other than nasopharyngeal swab, presence of viral mutation(s) within the areas targeted by this assay, and inadequate number of viral copies(<138 copies/mL). A negative result must be combined with clinical observations, patient history, and epidemiological information. The expected result is Negative.  Fact Sheet for Patients:  BloggerCourse.com  Fact Sheet for Healthcare Providers:  SeriousBroker.it  This test is no t yet approved or cleared by the [place]  FDA and  has been authorized for detection and/or diagnosis of SARS-CoV-2 by FDA under an Emergency Use Authorization (EUA). This EUA will remain  in effect (meaning this test can be used) for the duration of the COVID-19 declaration under Section 564(b)(1) of the Act, 21 U.S.C.section 360bbb-3(b)(1), unless the authorization is terminated  or revoked sooner.       Influenza A by PCR NEGATIVE NEGATIVE Final   Influenza B by PCR NEGATIVE NEGATIVE Final    Comment: (NOTE) The Xpert Xpress SARS-CoV-2/FLU/RSV plus assay is intended as an aid in the diagnosis of influenza from Nasopharyngeal swab specimens and should not be used as a sole basis for treatment. Nasal washings and aspirates are unacceptable for Xpert Xpress  SARS-CoV-2/FLU/RSV testing.  Fact Sheet for Patients: BloggerCourse.com  Fact Sheet for Healthcare Providers: SeriousBroker.it  This test is not yet approved or cleared by the [place]  FDA and has been authorized for detection and/or diagnosis of SARS-CoV-2 by FDA under an Emergency Use Authorization (EUA). This EUA will remain in effect (meaning this test can be used) for the duration of the COVID-19 declaration under Section 564(b)(1) of the Act, 21 U.S.C. section 360bbb-3(b)(1), unless the authorization is terminated or revoked.     Resp Syncytial Virus by PCR NEGATIVE NEGATIVE Final    Comment: (NOTE) Fact Sheet for Patients: BloggerCourse.com  Fact Sheet for Healthcare Providers: SeriousBroker.it  This test is not yet approved or cleared by the [place]  [place] FDA and has been authorized for detection and/or diagnosis of SARS-CoV-2 by FDA under an Emergency Use Authorization (EUA). This EUA will remain in effect (meaning this test can be used) for the duration of the COVID-19 declaration under Section 564(b)(1) of the Act, 21 U.S.C. section 360bbb-3(b)(1), unless the authorization is terminated or revoked.  Performed at [facility], [address] [place], [place] [address]   Blood culture (routine x 2)     Status: None (Preliminary result)   Collection Time: 05/30/24  2:25 PM   Specimen: BLOOD RIGHT ARM  Result Value Ref Range Status   Specimen Description BLOOD RIGHT ARM  Final   Special Requests   Final    BOTTLES DRAWN AEROBIC AND ANAEROBIC Blood Culture results may not be optimal due to an inadequate volume of blood received in culture bottles   Culture   Final    NO GROWTH < 24 HOURS Performed at [facility], [address] [place], [place] [address]    Report Status PENDING  Incomplete  Blood culture (routine x 2)     Status: None  (Preliminary result)   Collection Time: 05/30/24  3:01 PM   Specimen: BLOOD RIGHT HAND  Result Value Ref Range Status   Specimen Description BLOOD RIGHT HAND  Final   Special Requests   Final    BOTTLES DRAWN AEROBIC AND ANAEROBIC Blood Culture results may not be optimal due to an inadequate volume of blood received in culture bottles   Culture   Final    NO GROWTH < 24 HOURS Performed at [facility], [address] [place], [place] [address]    Report Status PENDING  Incomplete  MRSA Next Gen by PCR, Nasal     Status: None   Collection Time: 05/31/24  5:09 AM   Specimen: Nasal Mucosa; Nasal Swab  Result Value Ref Range Status   MRSA by PCR Next Gen NOT DETECTED NOT DETECTED Final    Comment: (NOTE) The GeneXpert MRSA Assay (FDA approved for NASAL specimens only), is one component of a comprehensive MRSA colonization surveillance program. It is not intended to diagnose MRSA infection nor to guide or monitor treatment for MRSA infections. Test performance is not FDA approved in patients less than 22 years old. Performed at [facility], [address], [place], [place] [address]      Radiology Studies: DG Chest [place] 1 View Result Date: 05/31/2024 CLINICAL DATA:  Pleural effusion EXAM: PORTABLE CHEST 1 VIEW COMPARISON:  May 30, 2024 FINDINGS: Residual but improving bilateral congestive changes with persistent elevation right hemidiaphragm, right lower lobe atelectasis and small right effusion. Mild cardiomegaly correlate with CHF IMPRESSION: Improving CHF. Electronically Signed   By: Fredrich Jefferson M.D.   On: 05/31/2024 11:58   DG Chest Portable 1 View Result Date: 05/30/2024 CLINICAL DATA:  Shortness of breath, respiratory distress. EXAM: PORTABLE CHEST 1 VIEW COMPARISON:  05/20/2024. FINDINGS: The heart is enlarged the mediastinal contour stable. There is atherosclerotic calcification of the aorta. The pulmonary vasculature is distended. Elevation the right  diaphragm is noted. Interstitial prominence is present bilaterally. There is mild airspace disease at the lung bases. There are possible small bilateral pleural effusions. No pneumothorax is seen. Surgical clips are noted in the thyroid  bed. IMPRESSION: 1. Cardiomegaly with pulmonary vascular congestion. 2. Interstitial prominence bilaterally with mild airspace disease at the lung bases, possible edema versus infection. 3. Possible small bilateral pleural effusions. Electronically Signed   By: Wyvonnia Heimlich  M.D.   On: 05/30/2024 14:44    Scheduled Meds:  amiodarone   200 mg Oral Daily   atorvastatin  10 mg Oral QHS   budesonide   0.5 mg Nebulization BID   diltiazem   360 mg Oral Daily   hydroxychloroquine   200 mg Oral Daily   insulin  aspart  0-6 Units Subcutaneous TID WC   levothyroxine   150 mcg Oral QAC breakfast   montelukast  10 mg Oral Daily   nicotine  14 mg Transdermal Daily   predniSONE   5 mg Oral Q breakfast   rivaroxaban   20 mg Oral q AM   sertraline   75 mg Oral Daily   Continuous Infusions:   LOS: 0 days  MDM: Patient is high risk for one or more organ failure.  They necessitate ongoing hospitalization for continued IV therapies and subsequent lab monitoring. Total time spent interpreting labs and vitals, reviewing the medical record, coordinating care amongst consultants and care team members, directly assessing and discussing care with the patient and/or family: 55 min Yittel Emrich, DO [facility]  To contact the attending physician between 7A-7P please use Epic Chat. To contact the covering physician during after hours 7P-7A, please review Amion.  05/31/2024, 4:24 PM   *This document has been created with the assistance of dictation software. Please excuse typographical errors. *

## 2024-05-31 NOTE — Evaluation (Signed)
 Physical Therapy Evaluation Patient Details Name: [name] MRN: [id number] DOB: 1956/10/31 Today's Date: 05/31/2024  History of Present Illness  Pt is a 68 y.o. female presents obtunded and hypoxic d/t being off 02 without staff knowledge at facility. Admitted for management of Acute on chronic hypoxic and hypercapnic respiratory failure, hypotension, hynoatremia, and AKI. PMH of COPD, rheumatoid arthritis, obesity, CHF, pulmonary embolism, type 2 diabetes, DVT, hypertension, hyperlipidemia, hypothyroidism, chronic respiratory failure on 6 L [place] and nighttime BiPAP, nicotine dependence, atrial fibrillation s/p cardioversion May 2025. Two recent hospitalizations for respiratory failure related to CHF and pneumonia, respiratory failure and epistaxis 2/2 to [facility] and DOAC.  Clinical Impression  Co-treat with OT this date.Pt is a pleasant 68 year old female who was admitted for respiratory failure. Pt performs bed mobility with supervision assist, mainly requiring increased time to perform movement with pt leaning left onto her arm to take a break. STS transfers performed with CGA x2, requiring verbal and tactile cueing for manipulation of RW to maintain safety, no LOB experienced. Pt vitals remained stable throughout movement, x1 desat to 88% on 50L HFNC but quickly returned to above 90% after cueing for diaphragmatic breathing. Ambulation deferred this date d/t pt fatigue. Pt demonstrates deficits with overall generalized weakness, activity intolerance, and balance. Pt would benefit from skilled PT interventions to address deficits noted above and to meet goals. PT to follow acutely as appropriate.          If plan is discharge home, recommend the following: A lot of help with walking and/or transfers;A lot of help with bathing/dressing/bathroom;Assistance with cooking/housework;Assist for transportation;Help with stairs or ramp for entrance   Can travel by private vehicle   No    Equipment  Recommendations None recommended by PT  Recommendations for Other Services       Functional Status Assessment Patient has had a recent decline in their functional status and demonstrates the ability to make significant improvements in function in a reasonable and predictable amount of time.     Precautions / Restrictions Precautions Precautions: Fall Recall of Precautions/Restrictions: Intact Restrictions Weight Bearing Restrictions Per Provider Order: No      Mobility  Bed Mobility Overal bed mobility: Needs Assistance Bed Mobility: Supine to Sit Rolling: Supervision   Supine to sit: HOB elevated, Used rails, Supervision     General bed mobility comments: no physical assist, increased time and occasional L sided lean d/t fatigue with seated balance at EOB    Transfers Overall transfer level: Needs assistance Equipment used: Rolling walker (2 wheels) Transfers: Bed to chair/wheelchair/BSC, Sit to/from Stand Sit to Stand: Contact guard assist, +2 safety/equipment Stand pivot transfers: Contact guard assist, +2 safety/equipment         General transfer comment: cueing for safety, sequencing, pacing and +2 for lines/leads management, replacement of purewick; utilized RW for a few steps and turn to recliner    Ambulation/Gait               General Gait Details: unable to perform this date d/t pt fatigue  Stairs            Wheelchair Mobility     Tilt Bed    Modified Rankin (Stroke Patients Only)       Balance Overall balance assessment: Needs assistance Sitting-balance support: Bilateral upper extremity supported, Feet supported Sitting balance-Leahy Scale: Fair Sitting balance - Comments: no LOB, but with fatigues leans to the L side onto forearm   Standing balance support: Bilateral upper  extremity supported Standing balance-Leahy Scale: Fair Standing balance comment: CGA x2 and RW USE                             Pertinent  Vitals/Pain Pain Assessment Pain Assessment: No/denies pain    Home Living Family/patient expects to be discharged to:: Skilled nursing facility                   Additional Comments: pt from [facility], resided there for 7 days before admission    Prior Function Prior Level of Function : Needs assist       Physical Assist : Mobility (physical);ADLs (physical) Mobility (physical): Bed mobility;Transfers;Gait;Stairs ADLs (physical): Grooming;Dressing;Bathing;IADLs;Toileting Mobility Comments: pt states she uses RW for short distances and WC for longer distances, unable to self propel ADLs Comments: pt states she needs assistance with IADLs and showering the most but can get herself to the bathroom     Extremity/Trunk Assessment   Upper Extremity Assessment Upper Extremity Assessment: Generalized weakness    Lower Extremity Assessment Lower Extremity Assessment: Generalized weakness    Cervical / Trunk Assessment Cervical / Trunk Assessment: Kyphotic  Communication   Communication Communication: No apparent difficulties    Cognition Arousal: Alert Behavior During Therapy: WFL for tasks assessed/performed   PT - Cognitive impairments: No apparent impairments                         Following commands: Intact       Cueing Cueing Techniques: Verbal cues, Tactile cues     General Comments General comments (skin integrity, edema, etc.): VSS throughout activity on 50L at 48.5%    Exercises     Assessment/Plan    PT Assessment Patient needs continued PT services  PT Problem List Decreased strength;Decreased activity tolerance;Decreased balance;Decreased mobility       PT Treatment Interventions Gait training;Stair training;Functional mobility training;Therapeutic activities;Therapeutic exercise;Balance training    PT Goals (Current goals can be found in the Care Plan section)  Acute Rehab PT Goals Patient Stated Goal: to get better and wean off  O2 PT Goal Formulation: With patient Time For Goal Achievement: 06/14/24 Potential to Achieve Goals: Fair    Frequency Min 2X/week     Co-evaluation PT/OT/SLP Co-Evaluation/Treatment: Yes Reason for Co-Treatment: Complexity of the patient's impairments (multi-system involvement) PT goals addressed during session: Mobility/safety with mobility OT goals addressed during session: ADL's and self-care       AM-PAC PT 6 Clicks Mobility  Outcome Measure Help needed turning from your back to your side while in a flat bed without using bedrails?: A Little Help needed moving from lying on your back to sitting on the side of a flat bed without using bedrails?: A Little Help needed moving to and from a bed to a chair (including a wheelchair)?: A Lot Help needed standing up from a chair using your arms (e.g., wheelchair or bedside chair)?: A Lot Help needed to walk in hospital room?: A Lot Help needed climbing 3-5 steps with a railing? : A Lot 6 Click Score: 14    End of Session Equipment Utilized During Treatment: Oxygen Activity Tolerance: Patient limited by fatigue Patient left: in chair;with call bell/phone within reach Nurse Communication: Mobility status PT Visit Diagnosis: Repeated falls (R29.6);Muscle weakness (generalized) (M62.81);History of falling (Z91.81)    Time: 1610-9604 PT Time Calculation (min) (ACUTE ONLY): 26 min   Charges:  Chella Chapdelaine Romero-Perozo, SPT  05/31/2024, 12:30 PM

## 2024-05-31 NOTE — Progress Notes (Signed)
 Heart Failure Stewardship Pharmacy Note  PCP: Marilyn Grimes, PA-C PCP-Cardiologist: None  HPI: [name] is a 68 y.o. female with COPD, rheumatoid arthritis, obesity, CHF, pulmonary embolism, type 2 diabetes, DVT, hypertension, hyperlipidemia, hypothyroidism, chronic respiratory failure on 6 L [place] and nighttime BiPAP, nicotine dependence, atrial fibrillation status post cardioversion May 2025. Patient was recently hospitalized 04/2024 reporting home BIPAP was broken. Patient missed CHF follow-up appointment at discharge. Now the patient returns with hypoxic respiratory failure with saturations in the 50s. On admission, BNP was 750.8, HS-troponin was 18, ferritin 127, TSAT 16, and lactic acid was 1.7. Chest x-ray noted pulmonary vascular congestion, edema vs infection, and small bilateral pleural effusions.   Pertinent cardiac history: Echo 06/2017 with LVEF of 60-65%, mild LVH, grade I diastolic dysfunction. Echo 02/2018 noted LVEF of 60-65%, moderate mid & basal hypertrophy, and severe mitral calcification. Most recent echo performed by [facility] on 04/26/24 shwoed LVEF of 55-60%, mildly reduced RV function, mild PH, mild to mdoerate MR, and indeterminate diastolic function. Echo this admission not yet completed.   Pertinent Lab Values: Creatinine, Ser  Date Value Ref Range Status  05/31/2024 1.37 (H) 0.44 - 1.00 mg/dL Final   BUN  Date Value Ref Range Status  05/31/2024 35 (H) 8 - 23 mg/dL Final  57/84/6962 31 (A) 4 - 21 Final   Potassium  Date Value Ref Range Status  05/31/2024 4.3 3.5 - 5.1 mmol/L Final   Sodium  Date Value Ref Range Status  05/31/2024 136 135 - 145 mmol/L Final  01/25/2021 136 (A) 137 - 147 Final   B Natriuretic Peptide  Date Value Ref Range Status  05/30/2024 750.8 (H) 0.0 - 100.0 pg/mL Final    Comment:    Performed at [facility], [address] [place], [place] [address]   Magnesium  Date Value Ref Range Status  05/31/2024 2.1 1.7 -  2.4 mg/dL Final    Comment:    Performed at [facility], [address] [place], [place] [address]   Hgb A1c MFr Bld  Date Value Ref Range Status  05/30/2024 4.9 4.8 - 5.6 % Final    Comment:    (NOTE) Diagnosis of Diabetes The following HbA1c ranges recommended by the American Diabetes Association (ADA) may be used as an aid in the diagnosis of diabetes mellitus.  Hemoglobin             Suggested A1C NGSP%              Diagnosis  <5.7                   Non Diabetic  5.7-6.4                Pre-Diabetic  >6.4                   Diabetic  <7.0                   Glycemic control for                       adults with diabetes.     TSH  Date Value Ref Range Status  05/30/2024 8.728 (H) 0.350 - 4.500 uIU/mL Final    Comment:    Performed by a 3rd Generation assay with a functional sensitivity of <=0.01 uIU/mL. Performed at [facility], [address] [place], [place] [address]   10/28/2018 0.721 0.450 -  4.500 uIU/mL Final   LDH  Date Value Ref Range Status  01/05/2021 199 (H) 98 - 192 U/L Final    Comment:    Performed at [facility], [address], [place], [place] [address]    Vital Signs:  Temp:  [97.9 F (36.6 C)-99.1 F (37.3 C)] 97.9 F (36.6 C) (06/16 0400) Pulse Rate:  [64-77] 67 (06/16 0400) Cardiac Rhythm: Normal sinus rhythm;Bundle branch block (06/15 1900) Resp:  [16-22] 20 (06/16 0400) BP: (79-125)/(47-97) 125/97 (06/16 0400) SpO2:  [64 %-99 %] 98 % (06/16 0728) FiO2 (%):  [40 %] 40 % (06/16 0400) Weight:  [109.2 kg (240 lb 11.2 oz)] 109.2 kg (240 lb 11.2 oz) (06/15 1434)  Intake/Output Summary (Last 24 hours) at 05/31/2024 0735 Last data filed at 05/31/2024 0600 Gross per 24 hour  Intake 240 ml  Output 500 ml  Net -260 ml    Current Heart Failure Medications:  Loop diuretic: Beta-Blocker: ACEI/ARB/ARNI: MRA: SGLT2i: Other:  Prior to admission Heart Failure Medications:  Loop diuretic: none Beta-Blocker:  none ACEI/ARB/ARNI: none MRA: none SGLT2i: none Other: diltiazem  360 mg daily  Assessment: 1. Acute on chronic diastolic heart failure (LVEF 55-60%)  , due to presumed NICM. NYHA class IV symptoms.  -Patient experiencing desaturation to 78% during attempted visit today. Will follow up at a later date.  Plan: 1) Medication changes recommended at this time: -Will follow up again later when patient is able to participate in conversation.  2) Patient assistance: -Pending  3) Education: -To be completed prior to discharge.  Medication Assistance / Insurance Benefits Check: Does the patient have prescription insurance?    Type of insurance plan:  Does the patient qualify for medication assistance through manufacturers or grants? Pending  Outpatient Pharmacy: Prior to admission outpatient pharmacy: SNF      Please do not hesitate to reach out with questions or concerns,  Bevely Brush, PharmD, CPP, BCPS Heart Failure Pharmacist  Phone - [phone] 05/31/2024 9:52 AM

## 2024-05-31 NOTE — Care Management Obs Status (Signed)
 MEDICARE OBSERVATION STATUS NOTIFICATION   Patient Details  Name: [name] MRN: [id number] Date of Birth: 11-29-1956   Medicare Observation Status Notification Given:  Yes    Anise Kerns 05/31/2024, 11:36 AM

## 2024-05-31 NOTE — Evaluation (Signed)
 Occupational Therapy Evaluation Patient Details Name: [name] MRN: [id number] DOB: 05/06/1956 Today's Date: 05/31/2024   History of Present Illness   Pt is a 68 y.o. female presents obtunded and hypoxic d/t being off 02 without staff knowledge at facility. Admitted for management of Acute on chronic hypoxic and hypercapnic respiratory failure, hypotension, hynoatremia, and AKI. PMH of COPD, rheumatoid arthritis, obesity, CHF, pulmonary embolism, type 2 diabetes, DVT, hypertension, hyperlipidemia, hypothyroidism, chronic respiratory failure on 6 L [place] and nighttime BiPAP, nicotine dependence, atrial fibrillation s/p cardioversion May 2025. Two recent hospitalizations for respiratory failure related to CHF and pneumonia, respiratory failure and epistaxis 2/2 to [facility] and [facility].     Clinical Impressions Pt was seen for PT/OT co-evaluation this date to maximize pt/therapist safety for lines/leads management and low activity tolerance. PTA, pt had been receiving STR at [facility] for the last week where she reports doing very little. She had been able to manage most ADLs, but had assist for showering. She ambulated short distances with a RW and utilized a W/C for longer distances with staff assist d/t inability to self propel.  Pt presents to acute OT demonstrating impaired ADL performance and functional mobility 2/2 weakness, low activity tolerance, and balance deficits. Pt currently requires SBA for bed mobility with HOB elevated and use of bedrail, increased time and effort. Pt with mild dizziness with sitting up, BP monitored and stable. Sp02 lowest reading of 88% on 50L HHFNC. She requires total assist for donning bil socks. Able to perform STS from EOB to RW with CGA X2 with increased time and steadying assist then progressed to step pivot to the recliner with cueing for sequencing and safety with RW management. +2 for lines/leads management and replacement of purewick. Pt needs increased time/rest  breaks between activity d/t low endurance. Pt would benefit from skilled OT services to address noted impairments and functional limitations to maximize safety and independence while minimizing falls risk and caregiver burden. Do anticipate the need for follow up OT services upon acute hospital DC.      If plan is discharge home, recommend the following:   A little help with walking and/or transfers;A lot of help with bathing/dressing/bathroom;Assist for transportation;Assistance with cooking/housework;Help with stairs or ramp for entrance     Functional Status Assessment   Patient has had a recent decline in their functional status and demonstrates the ability to make significant improvements in function in a reasonable and predictable amount of time.     Equipment Recommendations   Other (comment) (defer)     Recommendations for Other Services         Precautions/Restrictions   Precautions Precautions: Fall Recall of Precautions/Restrictions: Intact Restrictions Weight Bearing Restrictions Per Provider Order: No     Mobility Bed Mobility Overal bed mobility: Needs Assistance Bed Mobility: Supine to Sit     Supine to sit: HOB elevated, Used rails, Supervision     General bed mobility comments: no physical assist, increased time and occasional L sided lean d/t fatigue with seated balance at EOB    Transfers Overall transfer level: Needs assistance Equipment used: Rolling walker (2 wheels) Transfers: Bed to chair/wheelchair/BSC, Sit to/from Stand Sit to Stand: Contact guard assist, +2 safety/equipment Stand pivot transfers: Contact guard assist, +2 safety/equipment         General transfer comment: cueing for safety, sequencing, pacing and +2 for lines/leads management, replacement of purewick; utilized RW for a few steps and turn to recliner      Balance  Overall balance assessment: Needs assistance Sitting-balance support: Bilateral upper extremity  supported, Feet supported Sitting balance-Leahy Scale: Fair Sitting balance - Comments: no LOB, but with fatigues leans to the L side onto forearm   Standing balance support: Bilateral upper extremity supported Standing balance-Leahy Scale: Fair Standing balance comment: CGA x2 and RW USE                           ADL either performed or assessed with clinical judgement   ADL Overall ADL's : Needs assistance/impaired                     Lower Body Dressing: Total assistance;Bed level Lower Body Dressing Details (indicate cue type and reason): to don bil socks Toilet Transfer: Contact guard assist;+2 for safety/equipment Toilet Transfer Details (indicate cue type and reason): simulated to recliner using RW                 Vision         Perception         Praxis         Pertinent Vitals/Pain Pain Assessment Pain Assessment: No/denies pain     Extremity/Trunk Assessment Upper Extremity Assessment Upper Extremity Assessment: Generalized weakness   Lower Extremity Assessment Lower Extremity Assessment: Generalized weakness       Communication Communication Communication: No apparent difficulties   Cognition Arousal: Alert Behavior During Therapy: WFL for tasks assessed/performed                                 Following commands: Intact       Cueing  General Comments   Cueing Techniques: Verbal cues;Tactile cues  VSS throughout activity on 50L at 48.5%   Exercises Other Exercises Other Exercises: Edu on role of OT in acute setting.   Shoulder Instructions      Home Living Family/patient expects to be discharged to:: Skilled nursing facility                                 Additional Comments: pt from [facility], resided there for 7 days before admission      Prior Functioning/Environment Prior Level of Function : Needs assist       Physical Assist : Mobility (physical);ADLs (physical) Mobility  (physical): Bed mobility;Transfers;Gait;Stairs ADLs (physical): Grooming;Dressing;Bathing;IADLs;Toileting Mobility Comments: pt states she uses RW for short distances and WC for longer distances, unable to self propel ADLs Comments: pt states she needs assistance with IADLs and showering the most but can get herself to the bathroom    OT Problem List: Decreased activity tolerance;Decreased strength;Impaired balance (sitting and/or standing)   OT Treatment/Interventions: Self-care/ADL training;Therapeutic exercise;Therapeutic activities;Energy conservation;DME and/or AE instruction;Patient/family education;Balance training      OT Goals(Current goals can be found in the care plan section)   Acute Rehab OT Goals Patient Stated Goal: improve breathing OT Goal Formulation: With patient Time For Goal Achievement: 06/14/24 Potential to Achieve Goals: Fair ADL Goals Pt Will Perform Lower Body Dressing: with min assist;sit to/from stand;sitting/lateral leans;with adaptive equipment Pt Will Transfer to Toilet: with supervision;ambulating;regular height toilet;grab bars;bedside commode Pt Will Perform Toileting - Clothing Manipulation and hygiene: with supervision;sitting/lateral leans;sit to/from stand Additional ADL Goal #1: Pt will demo implementation of 1 learned ECS during ADL performance 2/2 trials to prevent overexertion.  OT Frequency:  Min 2X/week    Co-evaluation PT/OT/SLP Co-Evaluation/Treatment: Yes Reason for Co-Treatment: Complexity of the patient's impairments (multi-system involvement);For patient/therapist safety PT goals addressed during session: Mobility/safety with mobility OT goals addressed during session: ADL's and self-care      AM-PAC OT 6 Clicks Daily Activity     Outcome Measure Help from another person eating meals?: None Help from another person taking care of personal grooming?: A Little Help from another person toileting, which includes using toliet,  bedpan, or urinal?: A Lot Help from another person bathing (including washing, rinsing, drying)?: A Lot Help from another person to put on and taking off regular upper body clothing?: A Little Help from another person to put on and taking off regular lower body clothing?: A Lot 6 Click Score: 16   End of Session Equipment Utilized During Treatment: Rolling walker (2 wheels);Oxygen Nurse Communication: Mobility status  Activity Tolerance: Patient tolerated treatment well Patient left: in chair;with call bell/phone within reach;with nursing/sitter in room  OT Visit Diagnosis: Other abnormalities of gait and mobility (R26.89)                Time: 1610-9604 OT Time Calculation (min): 26 min Charges:  OT General Charges $OT Visit: 1 Visit OT Evaluation $OT Eval Moderate Complexity: 1 Mod  Christl Fessenden, OTR/L 05/31/24, 12:08 PM  Calypso Hagarty E Everline Mahaffy 05/31/2024, 12:04 PM

## 2024-05-31 NOTE — Progress Notes (Signed)
 Echocardiogram attempted, Patient refused because she just got in the chair and wants to stay in the chair due to breathing issues, she asked to come back later afternoon.

## 2024-05-31 NOTE — Progress Notes (Signed)
*  PRELIMINARY RESULTS* Echocardiogram 2D Echocardiogram has been performed.  [name] 05/31/2024, 2:48 PM

## 2024-05-31 NOTE — TOC Progression Note (Signed)
 Transition of Care [facility]) - Progression Note    Patient Details  Name: [name] MRN: [id number] Date of Birth: Jun 03, 1956  Transition of Care [facility]) CM/SW Contact  Baird Bombard, RN Phone Number: 05/31/2024, 12:30 PM  Clinical Narrative:    Patient recently discharge from [facility] to [facility] 05/24/2024.  Spoke with Gena at [facility]. Patient is noncompliant with BIPAP due to face mask. Patient currently using a nasal mask vs full face mask like her home machine. Facility was working to get her a Resmed BIPAP that would accommodate a face mask. Gena patient has 10 MCR days left. Patient MCD pending since 6/11.Patient will not be allowed to return until the BIPAP with a full mask has been secured per the DON and NP at facility.         Expected Discharge Plan and Services                                               Social Determinants of Health (SDOH) Interventions SDOH Screenings   Food Insecurity: No Food Insecurity (05/24/2024)  Housing: Low Risk  (05/24/2024)  Transportation Needs: No Transportation Needs (05/24/2024)  Utilities: Not At Risk (05/21/2024)  Financial Resource Strain: High Risk (05/24/2024)  Social Connections: Socially Isolated (05/21/2024)  Tobacco Use: Medium Risk (05/30/2024)    Readmission Risk Interventions     No data to display

## 2024-06-01 ENCOUNTER — Other Ambulatory Visit (HOSPITAL_COMMUNITY): Payer: Self-pay

## 2024-06-01 ENCOUNTER — Telehealth (HOSPITAL_COMMUNITY): Payer: Self-pay | Admitting: Pharmacy Technician

## 2024-06-01 DIAGNOSIS — J9601 Acute respiratory failure with hypoxia: Secondary | ICD-10-CM | POA: Diagnosis not present

## 2024-06-01 LAB — CBC WITH DIFFERENTIAL/PLATELET
Abs Immature Granulocytes: 0.21 10*3/uL — ABNORMAL HIGH (ref 0.00–0.07)
Basophils Absolute: 0 10*3/uL (ref 0.0–0.1)
Basophils Relative: 0 %
Eosinophils Absolute: 0 10*3/uL (ref 0.0–0.5)
Eosinophils Relative: 0 %
HCT: 23.2 % — ABNORMAL LOW (ref 36.0–46.0)
Hemoglobin: 7.2 g/dL — ABNORMAL LOW (ref 12.0–15.0)
Immature Granulocytes: 4 %
Lymphocytes Relative: 10 %
Lymphs Abs: 0.5 10*3/uL — ABNORMAL LOW (ref 0.7–4.0)
MCH: 30.3 pg (ref 26.0–34.0)
MCHC: 31 g/dL (ref 30.0–36.0)
MCV: 97.5 fL (ref 80.0–100.0)
Monocytes Absolute: 0.5 10*3/uL (ref 0.1–1.0)
Monocytes Relative: 10 %
Neutro Abs: 3.8 10*3/uL (ref 1.7–7.7)
Neutrophils Relative %: 76 %
Platelets: 234 10*3/uL (ref 150–400)
RBC: 2.38 MIL/uL — ABNORMAL LOW (ref 3.87–5.11)
RDW: 16.7 % — ABNORMAL HIGH (ref 11.5–15.5)
WBC: 5.1 10*3/uL (ref 4.0–10.5)
nRBC: 0.6 % — ABNORMAL HIGH (ref 0.0–0.2)

## 2024-06-01 LAB — COMPREHENSIVE METABOLIC PANEL WITH GFR
ALT: 16 U/L (ref 0–44)
AST: 13 U/L — ABNORMAL LOW (ref 15–41)
Albumin: 2.9 g/dL — ABNORMAL LOW (ref 3.5–5.0)
Alkaline Phosphatase: 50 U/L (ref 38–126)
Anion gap: 10 (ref 5–15)
BUN: 40 mg/dL — ABNORMAL HIGH (ref 8–23)
CO2: 33 mmol/L — ABNORMAL HIGH (ref 22–32)
Calcium: 8.8 mg/dL — ABNORMAL LOW (ref 8.9–10.3)
Chloride: 94 mmol/L — ABNORMAL LOW (ref 98–111)
Creatinine, Ser: 1.3 mg/dL — ABNORMAL HIGH (ref 0.44–1.00)
GFR, Estimated: 45 mL/min — ABNORMAL LOW (ref >60)
Glucose, Bld: 101 mg/dL — ABNORMAL HIGH (ref 70–99)
Potassium: 3.7 mmol/L (ref 3.5–5.1)
Sodium: 137 mmol/L (ref 135–145)
Total Bilirubin: 0.7 mg/dL (ref 0.0–1.2)
Total Protein: 5.8 g/dL — ABNORMAL LOW (ref 6.5–8.1)

## 2024-06-01 LAB — GLUCOSE, CAPILLARY
Glucose-Capillary: 90 mg/dL (ref 70–99)
Glucose-Capillary: 107 mg/dL — ABNORMAL HIGH (ref 70–99)
Glucose-Capillary: 110 mg/dL — ABNORMAL HIGH (ref 70–99)
Glucose-Capillary: 112 mg/dL — ABNORMAL HIGH (ref 70–99)

## 2024-06-01 LAB — MAGNESIUM: Magnesium: 2.2 mg/dL (ref 1.7–2.4)

## 2024-06-01 LAB — PHOSPHORUS: Phosphorus: 2.8 mg/dL (ref 2.5–4.6)

## 2024-06-01 MED ORDER — POLYETHYLENE GLYCOL 3350 17 G PO PACK
17.0000 g | PACK | Freq: Two times a day (BID) | ORAL | Status: DC
  Administered 2024-06-01 – 2024-06-07 (×4): 17 g via ORAL
  Filled 2024-06-01 (×10): qty 1

## 2024-06-01 NOTE — TOC Progression Note (Signed)
 Transition of Care [facility]) - Progression Note    Patient Details  Name: [name] MRN: [id number] Date of Birth: 12/31/1955  Transition of Care [facility]) CM/SW Contact  Baird Bombard, RN Phone Number: 06/01/2024, 4:09 PM  Clinical Narrative:    Spoke with patient's daughter in law, Bridgette Campus. She stated she has been told by Medicare a BIPAP could be obtained by this RNCM for discharge to the facility. Bridgette Campus advised insurance will not pay for a home BIPAP and SNF simultaneously. She was advised the facility is working to secure a BIPAP with a face mask that patient would be compliant with.          Expected Discharge Plan and Services                                               Social Determinants of Health (SDOH) Interventions SDOH Screenings   Food Insecurity: No Food Insecurity (05/24/2024)  Housing: Low Risk  (05/24/2024)  Transportation Needs: No Transportation Needs (05/24/2024)  Utilities: Not At Risk (05/21/2024)  Financial Resource Strain: High Risk (05/24/2024)  Social Connections: Socially Isolated (05/21/2024)  Tobacco Use: Medium Risk (05/30/2024)    Readmission Risk Interventions     No data to display

## 2024-06-01 NOTE — Progress Notes (Signed)
  Heart Failure Navigator Progress Note  Patient had a New TOC scheduled for 05/28/24 following a hospitalization and was a No Show.  Patient readmitted on 6/15 from [facility] for respiratory distress. Per notes patient normally on oxygen concentrator.  Machine stopped working and patient was changed to 2L [place] with an O2 tank.  Unknown how long patient went without O2 . She is usually on BiPAP. Patient was 70% on EMS arrival. New TOC scheduled for patient on 06/08/24 @ 1:15. Navigator will continue to monitor hospitalization LOS for timeliness of follow-up appointment.  Navigator available for reassessment of patient.  Celedonio Coil, RN, BSN [facility] Heart Failure Navigator Secure Chat Only

## 2024-06-01 NOTE — Telephone Encounter (Signed)
 Patient Product/process development scientist completed.    The patient is insured through [employer]. Patient has Medicare and is not eligible for a copay card, but may be able to apply for patient assistance or Medicare RX Payment Plan (Patient Must reach out to their plan, if eligible for payment plan), if available.    Ran test claim for Farxiga 10 mg and the current 30 day co-pay is $0.00.  Ran test claim for Jardiance 10 mg and the current 30 day co-pay is $0.00.  This test claim was processed through [facility]- copay amounts may vary at other pharmacies due to pharmacy/plan contracts, or as the patient moves through the different stages of their insurance plan.     Morgan Arab, CPHT Pharmacy Technician III Certified Patient Advocate [facility] Patient Advocate Team Direct Number: [phone]  Fax: [phone]

## 2024-06-01 NOTE — Progress Notes (Addendum)
 PROGRESS NOTE    [name]  ZOX:[id number] DOB: 04/03/1956 DOA: 05/30/2024 PCP: [name], PA-C  Chief Complaint  Patient presents with   Respiratory Distress    Hospital Course:  [name] is a 68 y.o. female with medical history significant of COPD, rheumatoid arthritis, obesity, CHF, pulmonary embolism, type 2 diabetes, DVT, hypertension, hyperlipidemia, hypothyroidism, chronic respiratory failure on 6 L [place] and nighttime BiPAP, nicotine dependence, atrial fibrillation status post cardioversion May 2025, currently on amiodarone  and Xarelto .  Patient was recently hospitalized 5/5 through 5/16 at [facility] for multifactorial respiratory failure related to CHF and pneumonia.  She was admitted to [facility] on 6/6 through 6/9 due to respiratory failure and epistaxis.  Patient reports that her home BiPAP was broken causing inadequate oxygen at home.  Admission was also complicated by epistaxis secondary to nasal cannula and DOAC. On this admission patient presents obtunded and hypoxic into the 50s.  Reportedly there was issue with patient's breathing device at her living facility and she was placed on oxygen tank which apparently ran out for a prolonged period of time without staff knowledge.  On arrival to the ED patient was hypoxic in the 50%.  Patient required bagging but was alert and responsive.  Patient was placed on BiPAP.  And had improvement. Initial pCO2 81, chest x-ray with interstitial prominence bilaterally and bilateral pleural effusions.  CMP revealed bump in creatinine, hyponatremia, mildly elevated troponin.  Patient was also found to be hypotensive but fluid responsive after 500 cc NS.  We were asked to admit the patient for further workup.  Subjective: No acute events overnight. On evaluation today patient reports she is feeling much better. No acute complaints. Reports she is still working to acquire bipap mask outpatient   Objective: Vitals:   06/01/24 0000  06/01/24 0200 06/01/24 0337 06/01/24 0400  BP: 108/64 105/68    Pulse: 63 (!) 59    Resp: 19 15    Temp: 97.7 F (36.5 C)  (!) 97.4 F (36.3 C) 97.6 F (36.4 C)  TempSrc: Axillary   Axillary  SpO2: 93%     Weight:      Height:        Intake/Output Summary (Last 24 hours) at 06/01/2024 1631 Last data filed at 06/01/2024 1508 Gross per 24 hour  Intake 0 ml  Output 1000 ml  Net -1000 ml   Filed Weights   05/30/24 1434  Weight: 109.2 kg    Examination: General exam: Appears calm and comfortable, NAD, obese. Respiratory system: No work of breathing, symmetric chest wall expansion.  6L [place] now. No wheeze. Better aeration bilaterally Cardiovascular system: S1 & S2 heard, RRR.  Gastrointestinal system: Abdomen is nondistended, soft and nontender. No lower extremity edema Neuro: Alert and oriented. No focal neurological deficits. Extremities: Symmetric, expected ROM Skin: No rashes, lesions Psychiatry: Demonstrates appropriate judgement and insight. Mood & affect appropriate for situation.   Assessment & Plan:  Principal Problem:   Acute hypoxic respiratory failure (HCC) Active Problems:   Hypercapnic respiratory failure (HCC)    Acute on chronic hypoxic and hypercapnic respiratory failure - Patient meant to be on 6 L O2 chronically with BiPAP at night.  BiPAP mask has not been working at SNF - pCO2 on arrival: 81, pH 7.3. - Able to wean off BiPAP, now down to 6 L [place] which is her baseline - Initial CXR with congestion bilaterally, received 40 mg IV push Lasix  x 1 with significant improvement on repeat CXR -  Appears to be nearing her respiratory baseline now - Reports she does not follow with pulmonology outpatient.  Is on daily steroids but not on consistent inhaled therapies, believes she may need new inhalers. - Infectious workup negative.  RVP negative, blood cultures negative to date.  No leukocytosis, no lactic acidosis. - Status post Methylpred 125 in ED, I do not  suspect that this is COPD exacerbation.  This appears to be all secondary to prolonged downtime without oxygen.  No antibiotics or further steroids indicated at this time - This is patient's second admission for BiPAP failure at SNF.  Reportedly they have BiPAP but do not have the mask  patient requires for comfortable use.  TOC consulted to look into this   Hypotension - Received 500 cc bolus in ED - Blood pressure better now, continue to monitor closely - If patient necessitates diuresis, she may require midodrine   Hyponatremia - Resolved - Trend CMP   AKI - Creatinine 0.8, elevated to 1.22 on arrival, did have small increase after receiving 40 mg IV push Lasix  on arrival. - Further diuresis has been held.  Patient appears clinically euvolemic now.  Though did have pulmonary edema on arrival - Continue to trend CMP - Creatinine beginning to improve but BUN is rising - Have encouraged p.o. rehydration - Renally dose with a creatinine clearance of 50 when needed - Follow strict I's and O's  Normocytic anemia Hemoglobin 7.2 - Baseline appears closer to 8.5-9. - Patient denies melena or hematochezia.  FOBT ordered - Iron, TIBC, ferritin WNL.  Folate within normal limits, B12 within normal limits. - Patient does consent to blood transfusion if needed.  She may require this if it continues to downtrend.  She will likely require diuresis with transfusion given propensity for pulmonary edema   Mildly elevated troponin - Troponin 18 on arrival -> 21.  Denies chest pain.  Suspect this is demand ischemia given profound hypoxia on arrival   Congestive Heart Failure with preserved EF -- Echocardiogram reveals EF 55 to 60%, mild concentric left ventricular hypertrophy, grade 2 diastolic dysfunction. - Vascular congestion seen on CXR though no peripheral edema appreciated - Patient endorses that she does not feel volume overloaded, BUN is rising - BNP 750 -Continue to follow strict I's and  O's - Will hold further diuresis given AKI - Monitor volume status closely  Moderate malnutrition - RD consult  Advanced COPD - Chronically on 6 L O2, BiPAP at night. - Budesonide .  On daily prednisone  and montelukast therapy.  Continue. - Will require new inhalers at discharge for better control   BMI 42 Obesity -- Some component of OHS playing a role in chronic respiratory failure as above - Outpatient follow up for lifestyle modification and risk factor management   Rheumatoid arthritis - Resume home meds   History of pulmonary embolism History of DVT - Continue home dose Xarelto    Hypothyroidism - Continue home dose levothyroxine    Hyperlipidemia - Continue statin   Nicotine dependence - Order nicotine patch   Atrial fibrillation - Status post cardioversion May 2025 - Continue amiodarone , dilt, and Xarelto  for now   DVT prophylaxis: Xarelto    Code Status: Full Code Disposition: Admit inpatient.  Still being treated for hypoxic and hypercapnic respiratory failure and AKI.  Will eventually discharge to skilled nursing facility when we can assure that they have BiPAP and oxygen needs.  Consultants:    Procedures:    Antimicrobials:  Anti-infectives (From admission, onward)  Start     Dose/Rate Route Frequency Ordered Stop   05/31/24 1000  hydroxychloroquine  (PLAQUENIL ) tablet 200 mg        200 mg Oral Daily 05/30/24 1735         Data Reviewed: I have personally reviewed following labs and imaging studies CBC: Recent Labs  Lab 05/30/24 1425 05/31/24 0421 06/01/24 0436  WBC 7.9 6.0 5.1  NEUTROABS 6.1 5.2 3.8  HGB 7.9* 7.6* 7.2*  HCT 25.6* 25.0* 23.2*  MCV 99.6 97.3 97.5  PLT 224 245 234   Basic Metabolic Panel: Recent Labs  Lab 05/30/24 1425 05/31/24 0421 06/01/24 0436  NA 133* 136 137  K 3.9 4.3 3.7  CL 90* 93* 94*  CO2 30 31 33*  GLUCOSE 144* 135* 101*  BUN 31* 35* 40*  CREATININE 1.22* 1.37* 1.30*  CALCIUM  9.3 9.1 8.8*  MG  --   2.1 2.2  PHOS  --  5.0* 2.8   GFR: Estimated Creatinine Clearance: 50.5 mL/min (A) (by C-G formula based on SCr of 1.3 mg/dL (H)). Liver Function Tests: Recent Labs  Lab 05/30/24 1425 05/31/24 0421 06/01/24 0436  AST 19 17 13*  ALT 19 19 16   ALKPHOS 59 57 50  BILITOT 0.8 0.6 0.7  PROT 6.4* 6.4* 5.8*  ALBUMIN 3.1* 3.1* 2.9*   CBG: Recent Labs  Lab 05/31/24 1618 05/31/24 2016 06/01/24 0837 06/01/24 1140 06/01/24 1602  GLUCAP 147* 173* 90 107* 110*    Recent Results (from the past 240 hours)  Resp panel by RT-PCR (RSV, Flu A&B, Covid) Anterior Nasal Swab     Status: None   Collection Time: 05/30/24  2:25 PM   Specimen: Anterior Nasal Swab  Result Value Ref Range Status   SARS Coronavirus 2 by RT PCR NEGATIVE NEGATIVE Final    Comment: (NOTE) SARS-CoV-2 target nucleic acids are NOT DETECTED.  The SARS-CoV-2 RNA is generally detectable in upper respiratory specimens during the acute phase of infection. The lowest concentration of SARS-CoV-2 viral copies this assay can detect is 138 copies/mL. A negative result does not preclude SARS-Cov-2 infection and should not be used as the sole basis for treatment or other patient management decisions. A negative result may occur with  improper specimen collection/handling, submission of specimen other than nasopharyngeal swab, presence of viral mutation(s) within the areas targeted by this assay, and inadequate number of viral copies(<138 copies/mL). A negative result must be combined with clinical observations, patient history, and epidemiological information. The expected result is Negative.  Fact Sheet for Patients:  BloggerCourse.com  Fact Sheet for Healthcare Providers:  SeriousBroker.it  This test is no t yet approved or cleared by the [place]  FDA and  has been authorized for detection and/or diagnosis of SARS-CoV-2 by FDA under an Emergency Use Authorization  (EUA). This EUA will remain  in effect (meaning this test can be used) for the duration of the COVID-19 declaration under Section 564(b)(1) of the Act, 21 U.S.C.section 360bbb-3(b)(1), unless the authorization is terminated  or revoked sooner.       Influenza A by PCR NEGATIVE NEGATIVE Final   Influenza B by PCR NEGATIVE NEGATIVE Final    Comment: (NOTE) The Xpert Xpress SARS-CoV-2/FLU/RSV plus assay is intended as an aid in the diagnosis of influenza from Nasopharyngeal swab specimens and should not be used as a sole basis for treatment. Nasal washings and aspirates are unacceptable for Xpert Xpress SARS-CoV-2/FLU/RSV testing.  Fact Sheet for Patients: BloggerCourse.com  Fact Sheet for Healthcare Providers: SeriousBroker.it  This test is not yet approved or cleared by the [place]  FDA and has been authorized for detection and/or diagnosis of SARS-CoV-2 by FDA under an Emergency Use Authorization (EUA). This EUA will remain in effect (meaning this test can be used) for the duration of the COVID-19 declaration under Section 564(b)(1) of the Act, 21 U.S.C. section 360bbb-3(b)(1), unless the authorization is terminated or revoked.     Resp Syncytial Virus by PCR NEGATIVE NEGATIVE Final    Comment: (NOTE) Fact Sheet for Patients: BloggerCourse.com  Fact Sheet for Healthcare Providers: SeriousBroker.it  This test is not yet approved or cleared by the [place]  FDA and has been authorized for detection and/or diagnosis of SARS-CoV-2 by FDA under an Emergency Use Authorization (EUA). This EUA will remain in effect (meaning this test can be used) for the duration of the COVID-19 declaration under Section 564(b)(1) of the Act, 21 U.S.C. section 360bbb-3(b)(1), unless the authorization is terminated or revoked.  Performed at [facility], [address]  [place], [place] [address]   Blood culture (routine x 2)     Status: None (Preliminary result)   Collection Time: 05/30/24  2:25 PM   Specimen: BLOOD RIGHT ARM  Result Value Ref Range Status   Specimen Description BLOOD RIGHT ARM  Final   Special Requests   Final    BOTTLES DRAWN AEROBIC AND ANAEROBIC Blood Culture results may not be optimal due to an inadequate volume of blood received in culture bottles   Culture   Final    NO GROWTH 2 DAYS Performed at [facility], [address], [place], [place] [address]    Report Status PENDING  Incomplete  Blood culture (routine x 2)     Status: None (Preliminary result)   Collection Time: 05/30/24  3:01 PM   Specimen: BLOOD RIGHT HAND  Result Value Ref Range Status   Specimen Description BLOOD RIGHT HAND  Final   Special Requests   Final    BOTTLES DRAWN AEROBIC AND ANAEROBIC Blood Culture results may not be optimal due to an inadequate volume of blood received in culture bottles   Culture   Final    NO GROWTH 2 DAYS Performed at [facility], [address], [place], [place] [address]    Report Status PENDING  Incomplete  MRSA Next Gen by PCR, Nasal     Status: None   Collection Time: 05/31/24  5:09 AM   Specimen: Nasal Mucosa; Nasal Swab  Result Value Ref Range Status   MRSA by PCR Next Gen NOT DETECTED NOT DETECTED Final    Comment: (NOTE) The GeneXpert MRSA Assay (FDA approved for NASAL specimens only), is one component of a comprehensive MRSA colonization surveillance program. It is not intended to diagnose MRSA infection nor to guide or monitor treatment for MRSA infections. Test performance is not FDA approved in patients less than 86 years old. Performed at [facility], [address] [place], [place] [address]      Radiology Studies: ECHOCARDIOGRAM COMPLETE Result Date: 05/31/2024    ECHOCARDIOGRAM REPORT   Patient Name:   FATE GALANTI Date of Exam: 05/31/2024 Medical Rec #:  [id number]        Height:       63.0 in Accession #:    [id number]      Weight:       240.7 lb Date of Birth:  Jan 11, 1956      BSA:  2.092 m Patient Age:    67 years        BP:           109/71 mmHg Patient Gender: F               HR:           69 bpm. Exam Location:  [facility] Procedure: 2D Echo, Cardiac Doppler and Color Doppler (Both Spectral and Color            Flow Doppler were utilized during procedure). Indications:     Congestive heart failure I50.9  History:         Patient has prior history of Echocardiogram examinations, most                  recent 02/17/2018. Signs/Symptoms:Dyspnea and Murmur; Risk                  Factors:Hypertension.  Sonographer:     Broadus Canes Referring Phys:  [id number] [name] [name] Diagnosing Phys: Dwayne D Callwood MD IMPRESSIONS  1. Left ventricular ejection fraction, by estimation, is 55 to 60%. The left ventricle has normal function. The left ventricle has no regional wall motion abnormalities. There is mild concentric left ventricular hypertrophy. Left ventricular diastolic parameters are consistent with Grade II diastolic dysfunction (pseudonormalization).  2. Right ventricular systolic function is normal. The right ventricular size is normal.  3. The mitral valve is grossly normal. Moderate mitral valve regurgitation.  4. The aortic valve is calcified. Aortic valve regurgitation is not visualized. Aortic valve sclerosis/calcification is present, without any evidence of aortic stenosis. FINDINGS  Left Ventricle: Left ventricular ejection fraction, by estimation, is 55 to 60%. The left ventricle has normal function. The left ventricle has no regional wall motion abnormalities. Strain was performed and the global longitudinal strain is indeterminate. Global longitudinal strain performed but not reported based on interpreter judgement due to suboptimal tracking. The left ventricular internal cavity size was normal in size. There is mild concentric left ventricular hypertrophy. Left  ventricular diastolic parameters are consistent with Grade II diastolic dysfunction (pseudonormalization). Right Ventricle: The right ventricular size is normal. No increase in right ventricular wall thickness. Right ventricular systolic function is normal. Left [facility]: Left atrial size was normal in size. Right [facility]: Right atrial size was normal in size. Pericardium: There is no evidence of pericardial effusion. Mitral Valve: The mitral valve is grossly normal. There is mild thickening of the mitral valve leaflet(s). Moderate mitral valve regurgitation. MV peak gradient, 10.6 mmHg. The mean mitral valve gradient is 3.0 mmHg. Tricuspid Valve: The tricuspid valve is normal in structure. Tricuspid valve regurgitation is trivial. Aortic Valve: The aortic valve is calcified. Aortic valve regurgitation is not visualized. Aortic valve sclerosis/calcification is present, without any evidence of aortic stenosis. Aortic valve mean gradient measures 4.5 mmHg. Aortic valve peak gradient measures 7.2 mmHg. Aortic valve area, by VTI measures 6.77 cm. Pulmonic Valve: The pulmonic valve was normal in structure. Pulmonic valve regurgitation is trivial. Aorta: The ascending aorta was not well visualized. IAS/Shunts: No atrial level shunt detected by color flow Doppler. Additional Comments: 3D was performed not requiring image post processing on an independent workstation and was indeterminate.  LEFT VENTRICLE PLAX 2D LVIDd:         5.10 cm   Diastology LVIDs:         3.50 cm   LV e' medial:    7.51 cm/s LV PW:  1.30 cm   LV E/e' medial:  18.8 LV IVS:        1.50 cm   LV e' lateral:   13.20 cm/s LVOT diam:     3.30 cm   LV E/e' lateral: 10.7 LV SV:         183 LV SV Index:   87 LVOT Area:     8.55 cm  RIGHT VENTRICLE RV Basal diam:  3.80 cm RV Mid diam:    2.90 cm RV S prime:     11.60 cm/s TAPSE (M-mode): 3.0 cm LEFT ATRIUM           Index        RIGHT ATRIUM           Index LA diam:      3.60 cm 1.72 cm/m   RA Area:      14.70 cm LA Vol (A2C): 36.6 ml 17.50 ml/m  RA Volume:   31.50 ml  15.06 ml/m LA Vol (A4C): 81.0 ml 38.72 ml/m  AORTIC VALVE                    PULMONIC VALVE AV Area (Vmax):    7.02 cm     PR End Diast Vel: 9.73 msec AV Area (Vmean):   6.44 cm AV Area (VTI):     6.77 cm AV Vmax:           134.00 cm/s AV Vmean:          95.200 cm/s AV VTI:            0.270 m AV Peak Grad:      7.2 mmHg AV Mean Grad:      4.5 mmHg LVOT Vmax:         110.00 cm/s LVOT Vmean:        71.700 cm/s LVOT VTI:          0.214 m LVOT/AV VTI ratio: 0.79  AORTA Ao Root diam: 3.90 cm MITRAL VALVE                TRICUSPID VALVE MV Area (PHT): 3.76 cm     TR Peak grad:   36.0 mmHg MV Area VTI:   4.06 cm     TR Vmax:        300.00 cm/s MV Peak grad:  10.6 mmHg MV Mean grad:  3.0 mmHg     SHUNTS MV Vmax:       1.63 m/s     Systemic VTI:  0.21 m MV Vmean:      75.6 cm/s    Systemic Diam: 3.30 cm MV Decel Time: 202 msec MV E velocity: 141.00 cm/s MV A velocity: 72.80 cm/s MV E/A ratio:  1.94 Dwayne D Callwood MD Electronically signed by Antonette Batters MD Signature Date/Time: 05/31/2024/5:44:01 PM    Final    DG Chest Port 1 View Result Date: 05/31/2024 CLINICAL DATA:  Pleural effusion EXAM: PORTABLE CHEST 1 VIEW COMPARISON:  May 30, 2024 FINDINGS: Residual but improving bilateral congestive changes with persistent elevation right hemidiaphragm, right lower lobe atelectasis and small right effusion. Mild cardiomegaly correlate with CHF IMPRESSION: Improving CHF. Electronically Signed   By: Fredrich Jefferson M.D.   On: 05/31/2024 11:58    Scheduled Meds:  amiodarone   200 mg Oral Daily   atorvastatin  10 mg Oral QHS   budesonide   0.5 mg Nebulization BID   diltiazem   360 mg Oral Daily   hydroxychloroquine   200 mg Oral Daily   insulin  aspart  0-6 Units Subcutaneous TID WC   levothyroxine   150 mcg Oral QAC breakfast   montelukast  10 mg Oral Daily   nicotine  14 mg Transdermal Daily   polyethylene glycol  17 g Oral BID   predniSONE   5  mg Oral Q breakfast   rivaroxaban   20 mg Oral q AM   sertraline   75 mg Oral Daily   Continuous Infusions:   LOS: 1 day  MDM: Patient is high risk for one or more organ failure.  They necessitate ongoing hospitalization for continued IV therapies and subsequent lab monitoring. Total time spent interpreting labs and vitals, reviewing the medical record, coordinating care amongst consultants and care team members, directly assessing and discussing care with the patient and/or family: 55 min Maanya Hippert, DO [facility]  To contact the attending physician between 7A-7P please use Epic Chat. To contact the covering physician during after hours 7P-7A, please review Amion.  06/01/2024, 4:31 PM   *This document has been created with the assistance of dictation software. Please excuse typographical errors. *

## 2024-06-01 NOTE — Progress Notes (Signed)
 Heart Failure Stewardship Pharmacy Note  PCP: [name], PA-C PCP-Cardiologist: None  HPI: [name] is a 68 y.o. female with COPD, rheumatoid arthritis, obesity, CHF, pulmonary embolism, type 2 diabetes, DVT, hypertension, hyperlipidemia, hypothyroidism, chronic respiratory failure on 6 L [place] and nighttime BiPAP, nicotine dependence, atrial fibrillation status post cardioversion May 2025. Patient was recently hospitalized 04/2024 reporting home BIPAP was broken. Patient missed CHF follow-up appointment at discharge. Now the patient returns with hypoxic respiratory failure with saturations in the 50s. On admission, BNP was 750.8, HS-troponin was 18, ferritin 127, TSAT 16, and lactic acid was 1.7. Chest x-ray noted pulmonary vascular congestion, edema vs infection, and small bilateral pleural effusions.   Pertinent cardiac history: Echo 06/2017 with LVEF of 60-65%, mild LVH, grade I diastolic dysfunction. Echo 02/2018 noted LVEF of 60-65%, moderate mid & basal hypertrophy, and severe mitral calcification. Most recent echo performed by [facility] on 04/26/24 shwoed LVEF of 55-60%, mildly reduced RV function, mild PH, mild to mdoerate MR, and indeterminate diastolic function. Echo this admission with LVEF of 55-60%, grade II diastolic dysfunction, moderate MR.  Pertinent Lab Values: Creatinine, Ser  Date Value Ref Range Status  06/01/2024 1.30 (H) 0.44 - 1.00 mg/dL Final   BUN  Date Value Ref Range Status  06/01/2024 40 (H) 8 - 23 mg/dL Final  16/09/9603 31 (A) 4 - 21 Final   Potassium  Date Value Ref Range Status  06/01/2024 3.7 3.5 - 5.1 mmol/L Final   Sodium  Date Value Ref Range Status  06/01/2024 137 135 - 145 mmol/L Final  01/25/2021 136 (A) 137 - 147 Final   B Natriuretic Peptide  Date Value Ref Range Status  05/30/2024 750.8 (H) 0.0 - 100.0 pg/mL Final    Comment:    Performed at [facility], [address] [place], [place] [address]   Magnesium  Date  Value Ref Range Status  06/01/2024 2.2 1.7 - 2.4 mg/dL Final    Comment:    Performed at [facility], [address] [place], [place] [address]   Hgb A1c MFr Bld  Date Value Ref Range Status  05/30/2024 4.9 4.8 - 5.6 % Final    Comment:    (NOTE) Diagnosis of Diabetes The following HbA1c ranges recommended by the American Diabetes Association (ADA) may be used as an aid in the diagnosis of diabetes mellitus.  Hemoglobin             Suggested A1C NGSP%              Diagnosis  <5.7                   Non Diabetic  5.7-6.4                Pre-Diabetic  >6.4                   Diabetic  <7.0                   Glycemic control for                       adults with diabetes.     TSH  Date Value Ref Range Status  05/30/2024 8.728 (H) 0.350 - 4.500 uIU/mL Final    Comment:    Performed by a 3rd Generation assay with a functional sensitivity of <=0.01 uIU/mL. Performed at [facility], [address] [place], [place] [address]  10/28/2018 0.721 0.450 - 4.500 uIU/mL Final   LDH  Date Value Ref Range Status  01/05/2021 199 (H) 98 - 192 U/L Final    Comment:    Performed at [facility], [address] [place], [place] [address]    Vital Signs:  Temp:  [97.4 F (36.3 C)-98 F (36.7 C)] 97.6 F (36.4 C) (06/17 0400) Pulse Rate:  [59-69] 59 (06/17 0200) Cardiac Rhythm: Normal sinus rhythm (06/17 0700) Resp:  [15-21] 15 (06/17 0200) BP: (104-109)/(61-71) 105/68 (06/17 0200) SpO2:  [92 %-100 %] 93 % (06/17 0000) FiO2 (%):  [50 %] 50 % (06/16 1709)  Intake/Output Summary (Last 24 hours) at 06/01/2024 1155 Last data filed at 06/01/2024 0336 Gross per 24 hour  Intake --  Output 1000 ml  Net -1000 ml   Current Heart Failure Medications:  Loop diuretic: none Beta-Blocker: none ACEI/ARB/ARNI: none MRA: none SGLT2i: none Other: none  Prior to admission Heart Failure Medications:  Loop diuretic: none Beta-Blocker: none ACEI/ARB/ARNI:  none MRA: none SGLT2i: none Other: diltiazem  360 mg daily  Assessment: 1. Acute on chronic diastolic heart failure (LVEF 55-60%)  , due to presumed NICM. NYHA class IV symptoms.  -Symptoms: Reports feeling better today. Appetite is poor. Breathing is stable on just 6L O2 with BIPAP overnight. -Volume: Does not appear to be overtly volume overloaded. Furosemide  has been held. -Hemodynamics: BP is lower end of normal. HR is 50-60s. May benefit from diltiazem  dose reduction given diastolic CHF. -SGLT2i: not ideal at this time due to external urinary catheter and poor mobility. -MRA: can consider adding prior to discharge pending BP, renal function, and K. -ARNI: not a good candidate at this time given soft BP.  Plan: 1) Medication changes recommended at this time: -None. Can consider diltiazem  reduction with HR ~60 and rate dependent cardiac output with diastolic dysfunction.  2) Patient assistance: -Pending  3) Education: - Patient has been educated on current HF medications and potential additions to HF medication regimen - Patient verbalizes understanding that over the next few months, these medication doses may change and more medications may be added to optimize HF regimen - Patient has been educated on basic disease state pathophysiology and goals of therapy   Medication Assistance / Insurance Benefits Check: Does the patient have prescription insurance?    Type of insurance plan:  Does the patient qualify for medication assistance through manufacturers or grants? Pending  Outpatient Pharmacy: Prior to admission outpatient pharmacy: SNF      Please do not hesitate to reach out with questions or concerns,  Bevely Brush, PharmD, CPP, BCPS Heart Failure Pharmacist  Phone - [phone] 06/01/2024 11:55 AM

## 2024-06-01 NOTE — Plan of Care (Signed)
  Problem: Education: Goal: Knowledge of General Education information will improve Description: Including pain rating scale, medication(s)/side effects and non-pharmacologic comfort measures Outcome: Progressing   Problem: Clinical Measurements: Goal: Respiratory complications will improve Outcome: Progressing   Problem: Clinical Measurements: Goal: Cardiovascular complication will be avoided Outcome: Progressing   Problem: Pain Managment: Goal: General experience of comfort will improve and/or be controlled Outcome: Progressing   Problem: Safety: Goal: Ability to remain free from injury will improve Outcome: Progressing   Problem: Nutritional: Goal: Maintenance of adequate nutrition will improve Outcome: Progressing

## 2024-06-01 NOTE — Progress Notes (Signed)
 Physical Therapy Treatment Patient Details Name: [name] MRN: [id number] DOB: 12-24-1955 Today's Date: 06/01/2024   History of Present Illness Pt is a 68 y.o. female presents obtunded and hypoxic d/t being off 02 without staff knowledge at facility. Admitted for management of Acute on chronic hypoxic and hypercapnic respiratory failure, hypotension, hynoatremia, and AKI. PMH of COPD, rheumatoid arthritis, obesity, CHF, pulmonary embolism, type 2 diabetes, DVT, hypertension, hyperlipidemia, hypothyroidism, chronic respiratory failure on 6 L [place] and nighttime BiPAP, nicotine dependence, atrial fibrillation s/p cardioversion May 2025. Two recent hospitalizations for respiratory failure related to CHF and pneumonia, respiratory failure and epistaxis 2/2 to [place] and DOAC.    PT Comments  Pt seen for PT & agreeable to tx, but does endorse fatigue, reporting she's sat up for ~3 hours. Pt is able to complete sit>stand with CGA with extra time to power up to standing. Pt requires cuing but with poor return demo of hand placement & eccentric lowering with stand>sit. Pt declines further standing attempts but does engage in BLE strengthening exercises sitting EOB. Pt reports this is the most she's done in awhile.  Pt assisted back to bed & left with all needs in reach. Pt with c/o SOB with bed mobility but SpO2 stable. Will continue to follow pt acutely to progress mobility as able.   If plan is discharge home, recommend the following: A lot of help with bathing/dressing/bathroom;Assistance with cooking/housework;Assist for transportation;Help with stairs or ramp for entrance;A little help with walking and/or transfers   Can travel by private vehicle     No  Equipment Recommendations  None recommended by PT (defer to next venue)    Recommendations for Other Services       Precautions / Restrictions Precautions Precautions: Fall Restrictions Weight Bearing Restrictions Per Provider Order: No      Mobility  Bed Mobility Overal bed mobility: Needs Assistance Bed Mobility: Sit to Supine       Sit to supine: Mod assist, HOB elevated, Used rails   General bed mobility comments: +2 to scoot to [facility]    Transfers Overall transfer level: Needs assistance Equipment used: Rolling walker (2 wheels) Transfers: Sit to/from Stand Sit to Stand: Contact guard assist (sit>stand from recliner, extra time to power up to standing)   Step pivot transfers: Contact guard assist (recliner>bed on R)       General transfer comment: cuing but poor return demo re: hand placement to reach back for stand>sit to control eccentric lowering; poor eccentric control    Ambulation/Gait                   Stairs             Wheelchair Mobility     Tilt Bed    Modified Rankin (Stroke Patients Only)       Balance Overall balance assessment: Needs assistance Sitting-balance support: Bilateral upper extremity supported, Feet supported Sitting balance-Leahy Scale: Good Sitting balance - Comments: supervision static sitting EOB   Standing balance support: Bilateral upper extremity supported, Reliant on assistive device for balance, During functional activity Standing balance-Leahy Scale: Fair                              Hotel manager: No apparent difficulties  Cognition Arousal: Alert Behavior During Therapy: WFL for tasks assessed/performed, Anxious (anxious re: breathing at times)   PT - Cognitive impairments: No apparent impairments  Following commands: Intact      Cueing Cueing Techniques: Verbal cues, Tactile cues  Exercises General Exercises - Lower Extremity Long Arc Quad: AROM, Seated, Strengthening, Both, 15 reps Hip Flexion/Marching: AROM, Seated, Strengthening, Both, 15 reps    General Comments General comments (skin integrity, edema, etc.): 6L/min via nasal cannula, lowest SpO2 of  80% but pt able to quickly recover to >/= 90% with rest      Pertinent Vitals/Pain Pain Assessment Pain Assessment: No/denies pain    Home Living                          Prior Function            PT Goals (current goals can now be found in the care plan section) Acute Rehab PT Goals Patient Stated Goal: to get better and wean off O2 PT Goal Formulation: With patient Time For Goal Achievement: 06/14/24 Potential to Achieve Goals: Good Progress towards PT goals: Progressing toward goals    Frequency    Min 2X/week      PT Plan      Co-evaluation              AM-PAC PT 6 Clicks Mobility   Outcome Measure  Help needed turning from your back to your side while in a flat bed without using bedrails?: A Little Help needed moving from lying on your back to sitting on the side of a flat bed without using bedrails?: A Little Help needed moving to and from a bed to a chair (including a wheelchair)?: A Little Help needed standing up from a chair using your arms (e.g., wheelchair or bedside chair)?: A Little Help needed to walk in hospital room?: A Lot Help needed climbing 3-5 steps with a railing? : Total 6 Click Score: 15    End of Session Equipment Utilized During Treatment: Oxygen Activity Tolerance: Patient limited by fatigue Patient left: in bed;with bed alarm set;with call bell/phone within reach Nurse Communication: Mobility status (pt dripping blood from somewhere but pt & PT unable to locate) PT Visit Diagnosis: Repeated falls (R29.6);Muscle weakness (generalized) (M62.81);History of falling (Z91.81);Unsteadiness on feet (R26.81);Difficulty in walking, not elsewhere classified (R26.2);Other abnormalities of gait and mobility (R26.89)     Time: 1096-0454 PT Time Calculation (min) (ACUTE ONLY): 21 min  Charges:    $Therapeutic Activity: 8-22 mins PT General Charges $$ ACUTE PT VISIT: 1 Visit                     Emaline Handsome, PT,  DPT 06/01/24, 3:40 PM   Venetta Gill 06/01/2024, 3:38 PM

## 2024-06-02 DIAGNOSIS — J9601 Acute respiratory failure with hypoxia: Secondary | ICD-10-CM | POA: Diagnosis not present

## 2024-06-02 LAB — CBC WITH DIFFERENTIAL/PLATELET
Abs Immature Granulocytes: 0.13 10*3/uL — ABNORMAL HIGH (ref 0.00–0.07)
Basophils Absolute: 0 10*3/uL (ref 0.0–0.1)
Basophils Relative: 0 %
Eosinophils Absolute: 0.1 10*3/uL (ref 0.0–0.5)
Eosinophils Relative: 1 %
HCT: 25.3 % — ABNORMAL LOW (ref 36.0–46.0)
Hemoglobin: 7.8 g/dL — ABNORMAL LOW (ref 12.0–15.0)
Immature Granulocytes: 3 %
Lymphocytes Relative: 16 %
Lymphs Abs: 0.7 10*3/uL (ref 0.7–4.0)
MCH: 30.7 pg (ref 26.0–34.0)
MCHC: 30.8 g/dL (ref 30.0–36.0)
MCV: 99.6 fL (ref 80.0–100.0)
Monocytes Absolute: 0.5 10*3/uL (ref 0.1–1.0)
Monocytes Relative: 12 %
Neutro Abs: 2.8 10*3/uL (ref 1.7–7.7)
Neutrophils Relative %: 68 %
Platelets: 226 10*3/uL (ref 150–400)
RBC: 2.54 MIL/uL — ABNORMAL LOW (ref 3.87–5.11)
RDW: 17 % — ABNORMAL HIGH (ref 11.5–15.5)
WBC: 4.2 10*3/uL (ref 4.0–10.5)
nRBC: 0 % (ref 0.0–0.2)

## 2024-06-02 LAB — COMPREHENSIVE METABOLIC PANEL WITH GFR
ALT: 15 U/L (ref 0–44)
AST: 12 U/L — ABNORMAL LOW (ref 15–41)
Albumin: 2.9 g/dL — ABNORMAL LOW (ref 3.5–5.0)
Alkaline Phosphatase: 52 U/L (ref 38–126)
Anion gap: 9 (ref 5–15)
BUN: 29 mg/dL — ABNORMAL HIGH (ref 8–23)
CO2: 34 mmol/L — ABNORMAL HIGH (ref 22–32)
Calcium: 8.9 mg/dL (ref 8.9–10.3)
Chloride: 97 mmol/L — ABNORMAL LOW (ref 98–111)
Creatinine, Ser: 1 mg/dL (ref 0.44–1.00)
GFR, Estimated: >60 mL/min (ref >60)
Glucose, Bld: 77 mg/dL (ref 70–99)
Potassium: 3.8 mmol/L (ref 3.5–5.1)
Sodium: 140 mmol/L (ref 135–145)
Total Bilirubin: 0.6 mg/dL (ref 0.0–1.2)
Total Protein: 5.6 g/dL — ABNORMAL LOW (ref 6.5–8.1)

## 2024-06-02 LAB — GLUCOSE, CAPILLARY
Glucose-Capillary: 75 mg/dL (ref 70–99)
Glucose-Capillary: 102 mg/dL — ABNORMAL HIGH (ref 70–99)
Glucose-Capillary: 165 mg/dL — ABNORMAL HIGH (ref 70–99)
Glucose-Capillary: 148 mg/dL — ABNORMAL HIGH (ref 70–99)

## 2024-06-02 LAB — PHOSPHORUS: Phosphorus: 2.5 mg/dL (ref 2.5–4.6)

## 2024-06-02 LAB — MAGNESIUM: Magnesium: 2.2 mg/dL (ref 1.7–2.4)

## 2024-06-02 MED ORDER — ADULT MULTIVITAMIN W/MINERALS CH
1.0000 | ORAL_TABLET | Freq: Every day | ORAL | Status: DC
  Administered 2024-06-02 – 2024-06-03 (×2): 1
  Filled 2024-06-02 (×2): qty 1

## 2024-06-02 MED ORDER — GABAPENTIN 400 MG PO CAPS
400.0000 mg | ORAL_CAPSULE | Freq: Three times a day (TID) | ORAL | Status: DC
  Administered 2024-06-02 – 2024-06-08 (×19): 400 mg via ORAL
  Filled 2024-06-02 (×19): qty 1

## 2024-06-02 MED ORDER — MELATONIN 5 MG PO TABS
5.0000 mg | ORAL_TABLET | Freq: Every day | ORAL | Status: DC
  Administered 2024-06-02 – 2024-06-07 (×6): 5 mg via ORAL
  Filled 2024-06-02 (×6): qty 1

## 2024-06-02 MED ORDER — FERROUS SULFATE 325 (65 FE) MG PO TABS
325.0000 mg | ORAL_TABLET | Freq: Every day | ORAL | Status: DC
  Administered 2024-06-03 – 2024-06-08 (×5): 325 mg via ORAL
  Filled 2024-06-02 (×5): qty 1

## 2024-06-02 MED ORDER — PANTOPRAZOLE SODIUM 40 MG PO TBEC
40.0000 mg | DELAYED_RELEASE_TABLET | Freq: Every day | ORAL | Status: DC
  Administered 2024-06-02 – 2024-06-08 (×7): 40 mg via ORAL
  Filled 2024-06-02 (×7): qty 1

## 2024-06-02 MED ORDER — SENNOSIDES-DOCUSATE SODIUM 8.6-50 MG PO TABS
2.0000 | ORAL_TABLET | Freq: Two times a day (BID) | ORAL | Status: DC
  Administered 2024-06-02 – 2024-06-07 (×5): 2 via ORAL
  Filled 2024-06-02 (×11): qty 2

## 2024-06-02 NOTE — Progress Notes (Signed)
 Nutrition Brief Note  RD consulted for assessment of nutritional requirements/ status.   Wt Readings from Last 15 Encounters:  05/30/24 109.2 kg  05/24/24 101.8 kg  05/21/21 90.7 kg  02/01/21 98 kg  01/31/21 97.8 kg  01/30/21 97.8 kg  01/18/21 96.3 kg  01/11/21 99.3 kg  01/10/21 98 kg  01/11/20 101.6 kg  10/28/18 99.3 kg  09/02/18 106 kg  08/04/18 99.1 kg  06/03/18 101.1 kg  04/21/18 102.3 kg   Pt with medical history significant of COPD, rheumatoid arthritis, obesity, CHF, pulmonary embolism, type 2 diabetes, DVT, hypertension, hyperlipidemia, hypothyroidism, chronic respiratory failure on 6 L  and nighttime BiPAP, nicotine dependence, atrial fibrillation status post cardioversion May 2025, currently on amiodarone  and Xarelto .  Patient was recently hospitalized 5/5 through 5/16 at [facility] for multifactorial respiratory failure related to CHF and pneumonia.  She was admitted to [facility] on 6/6 through 6/9 due to respiratory failure and epistaxis.  Patient reports that her home BiPAP was broken causing inadequate oxygen at home.  Admission was also complicated by epistaxis secondary to nasal cannula and DOAC.  On this admission patient presents obtunded and hypoxic into the 50s.  Reportedly there was issue with patient's breathing device at her living facility and she was placed on oxygen tank which apparently ran out for a prolonged period of time without staff knowledge.  Pt admitted with acute on chronic hypoxic and hypercapnic respiratory failure.   Reviewed I/O's: -332 ml x 24 hours and -1.6 L since admission  UOP: 450 ml x 24 hours  Pt on Bi-pap. She arouse easily to touch, however, provided limited conversation, mostly answering close ended questions. Pt reports she has not been sleeping well and she continues to get woken up by staff.   Per pt, she typically has a good appetite. She states she had been at [employer] (for a few days). She consumes a regular diet, however,  requested a dysphagia 3 diet secondary to no teeth. Pt reports tolerating this diet well.   Reviewed wt hx; no wt loss noted. Pt does not think she has lost weight.   Per MD and TOC notes, pt having difficulty procuring appropriate Bi-pap mask at SNF.  Nutrition-Focused physical exam completed. Findings are no fat depletion, mild muscle depletion, and mild edema.    Medications reviewed and include cardizem , miralax , and prednisone .   Lab Results  Component Value Date   HGBA1C 4.9 05/30/2024    PTA DM medications are 2-10 units insulin  lispor TID.   Labs reviewed: CBGS: 75-173 (inpatient orders for glycemic control are 0-6 units insulin  aspart TID).    Body mass index is 42.64 kg/m. Patient meets criteria for obesity, class III based on current BMI. Obesity is a complex, chronic medical condition that is optimally managed by a multidisciplinary care team. Weight loss is not an ideal goal for an acute inpatient hospitalization. However, if further work-up for obesity is warranted, consider outpatient referral to [facility].    Current diet order is dysphagia 3, patient is consuming approximately 100% of meals at this time. Labs and medications reviewed.   No nutrition interventions warranted at this time. If nutrition issues arise, please consult RD.   Herschel Lords, RD, LDN, CDCES Registered Dietitian III Certified Diabetes Care and Education Specialist If unable to reach this RD, please use RD Inpatient group chat on secure chat between hours of 8am-4 pm daily

## 2024-06-02 NOTE — Plan of Care (Signed)

## 2024-06-02 NOTE — Progress Notes (Signed)
 OT Cancellation Note  Patient Details Name: [name] MRN: [id number] DOB: 1956/07/09   Cancelled Treatment:    Reason Eval/Treat Not Completed: Other (comment). OT attempted treatment in AM with pt declining politely and asking for OT to return after lunch. OT attempted again ~1425 with pt found on bipap in recliner with sp02 noted to be 76-83% on monitor. Handheld pulse ox confirmed reading of 80%-nurse notified and called in to assess. OT to re-attempt tomorrow as medically stable.  Daleyza Gadomski E Lilibeth Opie 06/02/2024, 2:45 PM

## 2024-06-02 NOTE — Care Management Important Message (Signed)
 Important Message  Patient Details  Name: [name] MRN: [id number] Date of Birth: 1956/01/10   Important Message Given:  Yes - Medicare IM     Anise Kerns 06/02/2024, 1:05 PM

## 2024-06-02 NOTE — Progress Notes (Signed)
 PROGRESS NOTE    [name]  KGM:[id number] DOB: [birth year] DOA: 05/30/2024 PCP: [name], PA-C     Brief Narrative:   [name] is a 68 y.o. female with medical history significant of COPD, rheumatoid arthritis, obesity, CHF, pulmonary embolism, type 2 diabetes, DVT, hypertension, hyperlipidemia, hypothyroidism, chronic respiratory failure on 6 L [place] and nighttime BiPAP, nicotine dependence, atrial fibrillation status post cardioversion May 2025, currently on amiodarone  and Xarelto .  Patient was recently hospitalized 5/5 through 5/16 at [facility] for multifactorial respiratory failure related to CHF and pneumonia.  She was admitted to [name] on 6/6 through 6/9 due to respiratory failure and epistaxis.  Patient reports that her home BiPAP was broken causing inadequate oxygen at home.  Admission was also complicated by epistaxis secondary to nasal cannula and DOAC. On this admission patient presents obtunded and hypoxic into the 50s.  Reportedly there was issue with patient's breathing device at her living facility and she was placed on oxygen tank which apparently ran out for a prolonged period of time without staff knowledge.  On arrival to the ED patient was hypoxic in the 50%.  Patient required bagging but was alert and responsive.  Patient was placed on BiPAP.  And had improvement. Initial pCO2 81, chest x-ray with interstitial prominence bilaterally and bilateral pleural effusions.  CMP revealed bump in creatinine, hyponatremia, mildly elevated troponin.  Patient was also found to be hypotensive but fluid responsive after 500 cc NS.  We were asked to admit the patient for further workup.  Assessment & Plan:     # Acute on chronic hypoxic respiratory failure # OHS # OSA Recent admit for respiratory distress 2/2 malfunctioning bipap machine, that's apparently fixed but now doesn't have mask that fits her so arrived her hypoxic and in respiratory distress, this is resolved with  use of bipap here - continue bipap here qhs - TOC consulted working with SNF to resolve this mask issue  # Anemia of chronic disease Labs suggestive of chronic disease, recent large volume epistaxis plus daily lab draws likely contributing, hgb stable in the 7s. No melena or other bleeding (nurse saw stool yesterday and was brown) - monitor for  # COPD No exacerbation - home trelegy  # A-fib Rate controlled - home dilt, amio, xarelto   # Hypothyroid - home synthroid   # AKI resolved  # HFpEF Appears compensated, bnp wnl - home lasix   # Neuropathy, chronic pain - home gabapentin , tramadol   # RA - home plaquenil , arava , prednisone   # T2DM - SSI     DVT prophylaxis: xarelto  Code Status: full Family Communication: none at bedside  Level of care: Telemetry Cardiac Status is: inpt    Consultants:  none  Procedures: none  Antimicrobials:  none    Subjective: Reports breathing is at baseline. No nosebleeds.  Objective: Vitals:   06/01/24 2353 06/02/24 0429 06/02/24 0844 06/02/24 1224  BP: 124/79 118/65 112/67 121/69  Pulse: (!) 58 61 (!) 48 67  Resp: (!) 21 16  (!) 23  Temp: 97.6 F (36.4 C) 97.7 F (36.5 C) 97.8 F (36.6 C) 98.6 F (37 C)  TempSrc: Oral Oral Axillary Oral  SpO2: 98% 100% 100%   Weight:      Height:        Intake/Output Summary (Last 24 hours) at 06/02/2024 1445 Last data filed at 06/02/2024 1028 Gross per 24 hour  Intake 238 ml  Output 450 ml  Net -212 ml   [employer]  05/30/24 1434  Weight: 109.2 kg    Examination:  General exam: Appears calm and comfortable, chronically ill Respiratory system: Clear to auscultation. Respiratory effort normal. Cardiovascular system: S1 & S2 heard, irreg, distant Gastrointestinal system: Abdomen is obese, soft and nontender.   Central nervous system: Alert and oriented. No focal neurological deficits. Extremities: Symmetric 5 x 5 power. Skin: venous stasis changes in  LEs Psychiatry: Judgement and insight appear normal. Mood & affect appropriate.     Data Reviewed: I have personally reviewed following labs and imaging studies  CBC: Recent Labs  Lab 05/30/24 1425 05/31/24 0421 06/01/24 0436 06/02/24 0341  WBC 7.9 6.0 5.1 4.2  NEUTROABS 6.1 5.2 3.8 2.8  HGB 7.9* 7.6* 7.2* 7.8*  HCT 25.6* 25.0* 23.2* 25.3*  MCV 99.6 97.3 97.5 99.6  PLT 224 245 234 226   Basic Metabolic Panel: Recent Labs  Lab 05/30/24 1425 05/31/24 0421 06/01/24 0436 06/02/24 0341  NA 133* 136 137 140  K 3.9 4.3 3.7 3.8  CL 90* 93* 94* 97*  CO2 30 31 33* 34*  GLUCOSE 144* 135* 101* 77  BUN 31* 35* 40* 29*  CREATININE 1.22* 1.37* 1.30* 1.00  CALCIUM  9.3 9.1 8.8* 8.9  MG  --  2.1 2.2 2.2  PHOS  --  5.0* 2.8 2.5   GFR: Estimated Creatinine Clearance: 65.6 mL/min (by C-G formula based on SCr of 1 mg/dL). Liver Function Tests: Recent Labs  Lab 05/30/24 1425 05/31/24 0421 06/01/24 0436 06/02/24 0341  AST 19 17 13* 12*  ALT 19 19 16 15   ALKPHOS 59 57 50 52  BILITOT 0.8 0.6 0.7 0.6  PROT 6.4* 6.4* 5.8* 5.6*  ALBUMIN 3.1* 3.1* 2.9* 2.9*   No results for input(s): LIPASE, AMYLASE in the last 168 hours. No results for input(s): AMMONIA in the last 168 hours. Coagulation Profile: No results for input(s): INR, PROTIME in the last 168 hours.  Cardiac Enzymes: No results for input(s): CKTOTAL, CKMB, CKMBINDEX, TROPONINI in the last 168 hours. BNP (last 3 results) No results for input(s): PROBNP in the last 8760 hours. HbA1C: Recent Labs    05/30/24 1734  HGBA1C 4.9   CBG: Recent Labs  Lab 06/01/24 1140 06/01/24 1602 06/01/24 2020 06/02/24 0843 06/02/24 1222  GLUCAP 107* 110* 112* 75 102*   Lipid Profile: No results for input(s): CHOL, HDL, LDLCALC, TRIG, CHOLHDL, LDLDIRECT in the last 72 hours. Thyroid  Function Tests: No results for input(s): TSH, T4TOTAL, FREET4, T3FREE, THYROIDAB in the last 72  hours. Anemia Panel: No results for input(s): VITAMINB12, FOLATE, FERRITIN, TIBC, IRON, RETICCTPCT in the last 72 hours. Urine analysis:    Component Value Date/Time   COLORURINE YELLOW 05/21/2021 2128   APPEARANCEUR CLEAR 05/21/2021 2128   LABSPEC 1.010 05/21/2021 2128   PHURINE 7.0 05/21/2021 2128   GLUCOSEU NEGATIVE 05/21/2021 2128   HGBUR NEGATIVE 05/21/2021 2128   HGBUR small 07/21/2008 1538   BILIRUBINUR NEGATIVE 05/21/2021 2128   KETONESUR NEGATIVE 05/21/2021 2128   PROTEINUR NEGATIVE 05/21/2021 2128   UROBILINOGEN 0.2 04/23/2015 1851   NITRITE NEGATIVE 05/21/2021 2128   LEUKOCYTESUR TRACE (A) 05/21/2021 2128   Sepsis Labs: @LABRCNTIP (procalcitonin:4,lacticidven:4)  ) Recent Results (from the past 240 hours)  Resp panel by RT-PCR (RSV, Flu A&B, Covid) Anterior Nasal Swab     Status: None   Collection Time: 05/30/24  2:25 PM   Specimen: Anterior Nasal Swab  Result Value Ref Range Status   SARS Coronavirus 2 by RT PCR NEGATIVE NEGATIVE Final  Comment: (NOTE) SARS-CoV-2 target nucleic acids are NOT DETECTED.  The SARS-CoV-2 RNA is generally detectable in upper respiratory specimens during the acute phase of infection. The lowest concentration of SARS-CoV-2 viral copies this assay can detect is 138 copies/mL. A negative result does not preclude SARS-Cov-2 infection and should not be used as the sole basis for treatment or other patient management decisions. A negative result may occur with  improper specimen collection/handling, submission of specimen other than nasopharyngeal swab, presence of viral mutation(s) within the areas targeted by this assay, and inadequate number of viral copies(<138 copies/mL). A negative result must be combined with clinical observations, patient history, and epidemiological information. The expected result is Negative.  Fact Sheet for Patients:  BloggerCourse.com  Fact Sheet for Healthcare  Providers:  SeriousBroker.it  This test is no t yet approved or cleared by the [place]  FDA and  has been authorized for detection and/or diagnosis of SARS-CoV-2 by FDA under an Emergency Use Authorization (EUA). This EUA will remain  in effect (meaning this test can be used) for the duration of the COVID-19 declaration under Section 564(b)(1) of the Act, 21 U.S.C.section 360bbb-3(b)(1), unless the authorization is terminated  or revoked sooner.       Influenza A by PCR NEGATIVE NEGATIVE Final   Influenza B by PCR NEGATIVE NEGATIVE Final    Comment: (NOTE) The Xpert Xpress SARS-CoV-2/FLU/RSV plus assay is intended as an aid in the diagnosis of influenza from Nasopharyngeal swab specimens and should not be used as a sole basis for treatment. Nasal washings and aspirates are unacceptable for Xpert Xpress SARS-CoV-2/FLU/RSV testing.  Fact Sheet for Patients: BloggerCourse.com  Fact Sheet for Healthcare Providers: SeriousBroker.it  This test is not yet approved or cleared by the [place]  FDA and has been authorized for detection and/or diagnosis of SARS-CoV-2 by FDA under an Emergency Use Authorization (EUA). This EUA will remain in effect (meaning this test can be used) for the duration of the COVID-19 declaration under Section 564(b)(1) of the Act, 21 U.S.C. section 360bbb-3(b)(1), unless the authorization is terminated or revoked.     Resp Syncytial Virus by PCR NEGATIVE NEGATIVE Final    Comment: (NOTE) Fact Sheet for Patients: BloggerCourse.com  Fact Sheet for Healthcare Providers: SeriousBroker.it  This test is not yet approved or cleared by the [place]  FDA and has been authorized for detection and/or diagnosis of SARS-CoV-2 by FDA under an Emergency Use Authorization (EUA). This EUA will remain in effect (meaning this test can be  used) for the duration of the COVID-19 declaration under Section 564(b)(1) of the Act, 21 U.S.C. section 360bbb-3(b)(1), unless the authorization is terminated or revoked.  Performed at [facility], [address] [place], [place] [address]   Blood culture (routine x 2)     Status: None (Preliminary result)   Collection Time: 05/30/24  2:25 PM   Specimen: BLOOD RIGHT ARM  Result Value Ref Range Status   Specimen Description BLOOD RIGHT ARM  Final   Special Requests   Final    BOTTLES DRAWN AEROBIC AND ANAEROBIC Blood Culture results may not be optimal due to an inadequate volume of blood received in culture bottles   Culture   Final    NO GROWTH 3 DAYS Performed at [facility], [address] [place], [place] [address]    Report Status PENDING  Incomplete  Blood culture (routine x 2)     Status: None (Preliminary result)   Collection Time: 05/30/24  3:01 PM  Specimen: BLOOD RIGHT HAND  Result Value Ref Range Status   Specimen Description BLOOD RIGHT HAND  Final   Special Requests   Final    BOTTLES DRAWN AEROBIC AND ANAEROBIC Blood Culture results may not be optimal due to an inadequate volume of blood received in culture bottles   Culture   Final    NO GROWTH 3 DAYS Performed at [facility], [address] [place], [place] [address]    Report Status PENDING  Incomplete  MRSA Next Gen by PCR, Nasal     Status: None   Collection Time: 05/31/24  5:09 AM   Specimen: Nasal Mucosa; Nasal Swab  Result Value Ref Range Status   MRSA by PCR Next Gen NOT DETECTED NOT DETECTED Final    Comment: (NOTE) The GeneXpert MRSA Assay (FDA approved for NASAL specimens only), is one component of a comprehensive MRSA colonization surveillance program. It is not intended to diagnose MRSA infection nor to guide or monitor treatment for MRSA infections. Test performance is not FDA approved in patients less than 85 years old. Performed at [facility],  [address], [place], [place] [address]          Radiology Studies: ECHOCARDIOGRAM COMPLETE Result Date: 05/31/2024    ECHOCARDIOGRAM REPORT   Patient Name:   [name] Date of Exam: 05/31/2024 Medical Rec #:  [id number]       Height:       63.0 in Accession #:    [id number]      Weight:       240.7 lb Date of Birth:  21-May-1956      BSA:          2.092 m Patient Age:    67 years        BP:           109/71 mmHg Patient Gender: F               HR:           69 bpm. Exam Location:  [facility] Procedure: 2D Echo, Cardiac Doppler and Color Doppler (Both Spectral and Color            Flow Doppler were utilized during procedure). Indications:     Congestive heart failure I50.9  History:         Patient has prior history of Echocardiogram examinations, most                  recent 02/17/2018. Signs/Symptoms:Dyspnea and Murmur; Risk                  Factors:Hypertension.  Sonographer:     Broadus Canes Referring Phys:  [id number] ALEXANDRA DEZII Diagnosing Phys: Dwayne D Callwood MD IMPRESSIONS  1. Left ventricular ejection fraction, by estimation, is 55 to 60%. The left ventricle has normal function. The left ventricle has no regional wall motion abnormalities. There is mild concentric left ventricular hypertrophy. Left ventricular diastolic parameters are consistent with Grade II diastolic dysfunction (pseudonormalization).  2. Right ventricular systolic function is normal. The right ventricular size is normal.  3. The mitral valve is grossly normal. Moderate mitral valve regurgitation.  4. The aortic valve is calcified. Aortic valve regurgitation is not visualized. Aortic valve sclerosis/calcification is present, without any evidence of aortic stenosis. FINDINGS  Left Ventricle: Left ventricular ejection fraction, by estimation, is 55 to 60%. The left ventricle has normal function. The left ventricle has no regional wall motion abnormalities. Strain  was performed and the global longitudinal strain is  indeterminate. Global longitudinal strain performed but not reported based on interpreter judgement due to suboptimal tracking. The left ventricular internal cavity size was normal in size. There is mild concentric left ventricular hypertrophy. Left ventricular diastolic parameters are consistent with Grade II diastolic dysfunction (pseudonormalization). Right Ventricle: The right ventricular size is normal. No increase in right ventricular wall thickness. Right ventricular systolic function is normal. Left [facility]: Left atrial size was normal in size. Right [facility]: Right atrial size was normal in size. Pericardium: There is no evidence of pericardial effusion. Mitral Valve: The mitral valve is grossly normal. There is mild thickening of the mitral valve leaflet(s). Moderate mitral valve regurgitation. MV peak gradient, 10.6 mmHg. The mean mitral valve gradient is 3.0 mmHg. Tricuspid Valve: The tricuspid valve is normal in structure. Tricuspid valve regurgitation is trivial. Aortic Valve: The aortic valve is calcified. Aortic valve regurgitation is not visualized. Aortic valve sclerosis/calcification is present, without any evidence of aortic stenosis. Aortic valve mean gradient measures 4.5 mmHg. Aortic valve peak gradient measures 7.2 mmHg. Aortic valve area, by VTI measures 6.77 cm. Pulmonic Valve: The pulmonic valve was normal in structure. Pulmonic valve regurgitation is trivial. Aorta: The ascending aorta was not well visualized. IAS/Shunts: No atrial level shunt detected by color flow Doppler. Additional Comments: 3D was performed not requiring image post processing on an independent workstation and was indeterminate.  LEFT VENTRICLE PLAX 2D LVIDd:         5.10 cm   Diastology LVIDs:         3.50 cm   LV e' medial:    7.51 cm/s LV PW:         1.30 cm   LV E/e' medial:  18.8 LV IVS:        1.50 cm   LV e' lateral:   13.20 cm/s LVOT diam:     3.30 cm   LV E/e' lateral: 10.7 LV SV:         183 LV SV Index:   87  LVOT Area:     8.55 cm  RIGHT VENTRICLE RV Basal diam:  3.80 cm RV Mid diam:    2.90 cm RV S prime:     11.60 cm/s TAPSE (M-mode): 3.0 cm LEFT ATRIUM           Index        RIGHT ATRIUM           Index LA diam:      3.60 cm 1.72 cm/m   RA Area:     14.70 cm LA Vol (A2C): 36.6 ml 17.50 ml/m  RA Volume:   31.50 ml  15.06 ml/m LA Vol (A4C): 81.0 ml 38.72 ml/m  AORTIC VALVE                    PULMONIC VALVE AV Area (Vmax):    7.02 cm     PR End Diast Vel: 9.73 msec AV Area (Vmean):   6.44 cm AV Area (VTI):     6.77 cm AV Vmax:           134.00 cm/s AV Vmean:          95.200 cm/s AV VTI:            0.270 m AV Peak Grad:      7.2 mmHg AV Mean Grad:      4.5 mmHg LVOT Vmax:  110.00 cm/s LVOT Vmean:        71.700 cm/s LVOT VTI:          0.214 m LVOT/AV VTI ratio: 0.79  AORTA Ao Root diam: 3.90 cm MITRAL VALVE                TRICUSPID VALVE MV Area (PHT): 3.76 cm     TR Peak grad:   36.0 mmHg MV Area VTI:   4.06 cm     TR Vmax:        300.00 cm/s MV Peak grad:  10.6 mmHg MV Mean grad:  3.0 mmHg     SHUNTS MV Vmax:       1.63 m/s     Systemic VTI:  0.21 m MV Vmean:      75.6 cm/s    Systemic Diam: 3.30 cm MV Decel Time: 202 msec MV E velocity: 141.00 cm/s MV A velocity: 72.80 cm/s MV E/A ratio:  1.94 Dwayne D Callwood MD Electronically signed by Antonette Batters MD Signature Date/Time: 05/31/2024/5:44:01 PM    Final          Scheduled Meds:  amiodarone   200 mg Oral Daily   atorvastatin  10 mg Oral QHS   budesonide   0.5 mg Nebulization BID   diltiazem   360 mg Oral Daily   hydroxychloroquine   200 mg Oral Daily   insulin  aspart  0-6 Units Subcutaneous TID WC   levothyroxine   150 mcg Oral QAC breakfast   montelukast  10 mg Oral Daily   multivitamin with minerals  1 tablet Per Tube Daily   nicotine  14 mg Transdermal Daily   polyethylene glycol  17 g Oral BID   predniSONE   5 mg Oral Q breakfast   rivaroxaban   20 mg Oral q AM   sertraline   75 mg Oral Daily   Continuous Infusions:   LOS: 2  days     Raymonde Calico, MD [facility]   If 7PM-7AM, please contact night-coverage [email or web address] Password [facility] 06/02/2024, 2:45 PM

## 2024-06-02 NOTE — TOC Progression Note (Signed)
 Transition of Care [facility]) - Progression Note    Patient Details  Name: [name] MRN: [id number] Date of Birth: Jun 13, 1956  [facility] CM/SW Contact  Baird Bombard, RN Phone Number: 06/02/2024, 11:23 AM  Clinical Narrative:    Transition of Care Department [facility]) has reviewed patient. We will continue to monitor patient advancement.         Expected Discharge Plan and Services                                               Social Determinants of Health (SDOH) Interventions SDOH Screenings   Food Insecurity: No Food Insecurity (05/24/2024)  Housing: Low Risk  (05/24/2024)  Transportation Needs: No Transportation Needs (05/24/2024)  Utilities: Not At Risk (05/21/2024)  Financial Resource Strain: High Risk (05/24/2024)  Social Connections: Socially Isolated (05/21/2024)  Tobacco Use: Medium Risk (05/30/2024)    Readmission Risk Interventions     No data to display

## 2024-06-02 NOTE — Progress Notes (Signed)
 Heart Failure Stewardship Pharmacy Note  PCP: Marilyn Grimes, PA-C PCP-Cardiologist: None  HPI: [name] is a 68 y.o. female with COPD, rheumatoid arthritis, obesity, CHF, pulmonary embolism, type 2 diabetes, DVT, hypertension, hyperlipidemia, hypothyroidism, chronic respiratory failure on 6 L [place] and nighttime BiPAP, nicotine dependence, atrial fibrillation status post cardioversion May 2025. Patient was recently hospitalized 04/2024 reporting home BIPAP was broken. Patient missed CHF follow-up appointment at discharge. Now the patient returns with hypoxic respiratory failure with saturations in the 50s. On admission, BNP was 750.8, HS-troponin was 18, ferritin 127, TSAT 16, and lactic acid was 1.7. Chest x-ray noted pulmonary vascular congestion, edema vs infection, and small bilateral pleural effusions.   Pertinent cardiac history: Echo 06/2017 with LVEF of 60-65%, mild LVH, grade I diastolic dysfunction. Echo 02/2018 noted LVEF of 60-65%, moderate mid & basal hypertrophy, and severe mitral calcification. Most recent echo performed by [facility] on 04/26/24 shwoed LVEF of 55-60%, mildly reduced RV function, mild PH, mild to mdoerate MR, and indeterminate diastolic function. Echo this admission with LVEF of 55-60%, grade II diastolic dysfunction, moderate MR.  Pertinent Lab Values: Creatinine, Ser  Date Value Ref Range Status  06/02/2024 1.00 0.44 - 1.00 mg/dL Final   BUN  Date Value Ref Range Status  06/02/2024 29 (H) 8 - 23 mg/dL Final  16/09/9603 31 (A) 4 - 21 Final   Potassium  Date Value Ref Range Status  06/02/2024 3.8 3.5 - 5.1 mmol/L Final   Sodium  Date Value Ref Range Status  06/02/2024 140 135 - 145 mmol/L Final  01/25/2021 136 (A) 137 - 147 Final   B Natriuretic Peptide  Date Value Ref Range Status  05/30/2024 750.8 (H) 0.0 - 100.0 pg/mL Final    Comment:    Performed at [facility], [address] [place], [place] [address]   Magnesium  Date Value  Ref Range Status  06/02/2024 2.2 1.7 - 2.4 mg/dL Final    Comment:    Performed at [facility], [address] [place], [place] [address]   Hgb A1c MFr Bld  Date Value Ref Range Status  05/30/2024 4.9 4.8 - 5.6 % Final    Comment:    (NOTE) Diagnosis of Diabetes The following HbA1c ranges recommended by the American Diabetes Association (ADA) may be used as an aid in the diagnosis of diabetes mellitus.  Hemoglobin             Suggested A1C NGSP%              Diagnosis  <5.7                   Non Diabetic  5.7-6.4                Pre-Diabetic  >6.4                   Diabetic  <7.0                   Glycemic control for                       adults with diabetes.     TSH  Date Value Ref Range Status  05/30/2024 8.728 (H) 0.350 - 4.500 uIU/mL Final    Comment:    Performed by a 3rd Generation assay with a functional sensitivity of <=0.01 uIU/mL. Performed at [facility], [address], [place], [place] [address]  10/28/2018 0.721 0.450 - 4.500 uIU/mL Final   LDH  Date Value Ref Range Status  01/05/2021 199 (H) 98 - 192 U/L Final    Comment:    Performed at [facility], [address], [place], [place] [address]    Vital Signs:  Temp:  [97.6 F (36.4 C)-98.2 F (36.8 C)] 97.7 F (36.5 C) (06/18 0429) Pulse Rate:  [58-70] 61 (06/18 0429) Cardiac Rhythm: Normal sinus rhythm;Bundle branch block (06/17 1901) Resp:  [12-21] 16 (06/18 0429) BP: (117-124)/(65-79) 118/65 (06/18 0429) SpO2:  [90 %-100 %] 100 % (06/18 0429) FiO2 (%):  [32 %] 32 % (06/17 2226)  Intake/Output Summary (Last 24 hours) at 06/02/2024 0820 Last data filed at 06/02/2024 0428 Gross per 24 hour  Intake 118 ml  Output 450 ml  Net -332 ml   Current Heart Failure Medications:  Loop diuretic: none Beta-Blocker: none ACEI/ARB/ARNI: none MRA: none SGLT2i: none Other: none  Prior to admission Heart Failure Medications:  Loop diuretic: none Beta-Blocker:  none ACEI/ARB/ARNI: none MRA: none SGLT2i: none Other: diltiazem  360 mg daily  Assessment: 1. Acute on chronic diastolic heart failure (LVEF 55-60%)  , due to presumed NICM. NYHA class IV symptoms.  -Symptoms: Reports feeling more tired today. Appetite is poor. Requiring BIPAP this AM. -Volume: Does not appear to be overtly volume overloaded. Furosemide  has been held. -Hemodynamics: BP is lower end of normal. HR is 40-60s. Would benefit from diltiazem  dose reduction given diastolic CHF and lower HRs. Diastolic CHF is dependent on HF given fixed stroke volume. -SGLT2i: not ideal at this time due to external urinary catheter and poor mobility. -MRA: can consider adding prior to discharge pending BP, renal function, and K. -ARNI: not a good candidate at this time given soft BP.  Plan: 1) Medication changes recommended at this time: -Consider diltiazem  reduction to 120 mg with HR ~40-60  2) Patient assistance: -Farxiga and Jardiance copays are $0  3) Education: - Patient has been educated on current HF medications and potential additions to HF medication regimen - Patient verbalizes understanding that over the next few months, these medication doses may change and more medications may be added to optimize HF regimen - Patient has been educated on basic disease state pathophysiology and goals of therapy   Medication Assistance / Insurance Benefits Check: Does the patient have prescription insurance?    Type of insurance plan:  Does the patient qualify for medication assistance through manufacturers or grants? Pending  Outpatient Pharmacy: Prior to admission outpatient pharmacy: SNF      Please do not hesitate to reach out with questions or concerns,  Bevely Brush, PharmD, CPP, BCPS Heart Failure Pharmacist  Phone - [phone] 06/02/2024 8:20 AM

## 2024-06-03 DIAGNOSIS — J9601 Acute respiratory failure with hypoxia: Secondary | ICD-10-CM | POA: Diagnosis not present

## 2024-06-03 LAB — GLUCOSE, CAPILLARY
Glucose-Capillary: 95 mg/dL (ref 70–99)
Glucose-Capillary: 209 mg/dL — ABNORMAL HIGH (ref 70–99)
Glucose-Capillary: 102 mg/dL — ABNORMAL HIGH (ref 70–99)
Glucose-Capillary: 105 mg/dL — ABNORMAL HIGH (ref 70–99)

## 2024-06-03 MED ORDER — ADULT MULTIVITAMIN W/MINERALS CH
1.0000 | ORAL_TABLET | Freq: Every day | ORAL | Status: DC
  Administered 2024-06-04 – 2024-06-08 (×5): 1 via ORAL
  Filled 2024-06-03 (×5): qty 1

## 2024-06-03 NOTE — Plan of Care (Signed)
  Problem: Education: Goal: Ability to describe self-care measures that may prevent or decrease complications (Diabetes Survival Skills Education) will improve Outcome: Progressing   Problem: Coping: Goal: Ability to adjust to condition or change in health will improve Outcome: Progressing   Problem: Fluid Volume: Goal: Ability to maintain a balanced intake and output will improve Outcome: Progressing   Problem: Tissue Perfusion: Goal: Adequacy of tissue perfusion will improve Outcome: Progressing   Problem: Clinical Measurements: Goal: Ability to maintain clinical measurements within normal limits will improve Outcome: Progressing

## 2024-06-03 NOTE — Progress Notes (Signed)
 06/03/24 2251  What Happened  Was fall witnessed? Yes  Who witnessed fall? Miachel Adams NT  Patients activity before fall to/from bed, chair, or stretcher  Point of contact hip/leg;buttocks  Was patient injured? No  Provider Notification  Provider Name/Title B. Boston Byers NP  Date Provider Notified 06/03/24  Time Provider Notified 2230  Method of Notification Page  Notification Reason Fall  Provider response No new orders  Date of Provider Response 06/03/24  Time of Provider Response 2234  Follow Up  Family notified No - patient refusal  Additional tests No  Simple treatment  (NA)  Progress note created (see row info) Yes  Adult Fall Risk Assessment  Risk Factor Category (scoring not indicated) Fall has occurred during this admission (document High fall risk)  Patient Fall Risk Level High fall risk  Adult Fall Risk Interventions  Required Bundle Interventions *See Row Information* High fall risk - low, moderate, and high requirements implemented  Additional Interventions Room near nurses station  Screening for Fall Injury Risk (To be completed on HIGH fall risk patients) - Assessing Need for Floor Mats  Risk For Fall Injury- Criteria for Floor Mats None identified - No additional interventions needed  Vitals  Temp 98.2 F (36.8 C)  Temp Source Oral  MAP (mmHg) 79  BP Location Left Arm  BP Method Automatic  Patient Position (if appropriate) Sitting  Pulse Rate 75  Pulse Rate Source Monitor  ECG Heart Rate 69  Cardiac Rhythm NSR;BBB  Resp 20  Oxygen Therapy  SpO2 100 %  O2 Device HFNC  O2 Flow Rate (L/min) 6 L/min  Patient Activity (if Appropriate) In bed  Pulse Oximetry Type Intermittent  Pain Assessment  Pain Scale 0-10  Pain Score 0  PCA/Epidural/Spinal Assessment  Respiratory Pattern Regular;Unlabored  Neurological  Neuro (WDL) WDL  Level of Consciousness Alert  Orientation Level Oriented X4  Cognition Appropriate at baseline  Speech Clear  R Pupil Size  (mm) 4  R Pupil Shape Round  R Pupil Reaction Brisk  L Pupil Size (mm) 3  L Pupil Shape Round  L Pupil Reaction Brisk  Motor Function/Sensation Assessment Grip  R Hand Grip Moderate  L Hand Grip Moderate  R Foot Dorsiflexion Moderate  L Foot Dorsiflexion Moderate  R Foot Plantar Flexion Moderate  L Foot Plantar Flexion Moderate  RUE Motor Response Purposeful movement  RUE Sensation Full sensation  RUE Motor Strength 3  LUE Motor Response Purposeful movement  LUE Sensation Full sensation  LUE Motor Strength 4  RLE Motor Response Purposeful movement  RLE Sensation Full sensation  RLE Motor Strength 3  LLE Motor Response Purposeful movement  LLE Sensation Full sensation  LLE Motor Strength 4  Neuro Symptoms Fatigue  Neuro symptoms relieved by Rest  Musculoskeletal  Musculoskeletal (WDL) X  Assistive Device BSC;Front wheel walker  Generalized Weakness Yes  Weight Bearing Restrictions Per Provider Order No  Integumentary  Integumentary (WDL) WDL  Nurse assisting at admit/transfer - Full skin assessment Mericar  Skin Color Appropriate for ethnicity  Skin Condition Dry;Flaky  Skin Integrity Ecchymosis  Ecchymosis Location Arm  Ecchymosis Location Orientation Bilateral  Erythema/Redness Location Buttocks;Groin  Erythema/Redness Location Orientation Bilateral  Skin Turgor Non-tenting  Oxygen Therapy/Pulse Ox  $ High Flow Nasal Cannula  Yes  Pain Assessment  Date Pain First Started  (Chronic knee and leg pain)  Result of Injury No  Pain Assessment  Work-Related Injury No  Pain Screening  Clinical Progression Not changed  Effect of Pain  on Daily Activities no  Response to Interventions Patient placed back into bed

## 2024-06-03 NOTE — Progress Notes (Signed)
 PROGRESS NOTE    [name]  ZOX:[id number] DOB: Mar 06, 1956 DOA: 05/30/2024 PCP: [name], PA-C     Brief Narrative:   [name] is a 68 y.o. female with medical history significant of COPD, rheumatoid arthritis, obesity, CHF, pulmonary embolism, type 2 diabetes, DVT, hypertension, hyperlipidemia, hypothyroidism, chronic respiratory failure on 6 L [place] and nighttime BiPAP, nicotine dependence, atrial fibrillation status post cardioversion May 2025, currently on amiodarone  and Xarelto .  Patient was recently hospitalized 5/5 through 5/16 at [facility] for multifactorial respiratory failure related to CHF and pneumonia.  She was admitted to [facility] on 6/6 through 6/9 due to respiratory failure and epistaxis.  Patient reports that her home BiPAP was broken causing inadequate oxygen at home.  Admission was also complicated by epistaxis secondary to nasal cannula and DOAC. On this admission patient presents obtunded and hypoxic into the 50s.  Reportedly there was issue with patient's breathing device at her living facility and she was placed on oxygen tank which apparently ran out for a prolonged period of time without staff knowledge.  On arrival to the ED patient was hypoxic in the 50%.  Patient required bagging but was alert and responsive.  Patient was placed on BiPAP.  And had improvement. Initial pCO2 81, chest x-ray with interstitial prominence bilaterally and bilateral pleural effusions.  CMP revealed bump in creatinine, hyponatremia, mildly elevated troponin.  Patient was also found to be hypotensive but fluid responsive after 500 cc NS.  We were asked to admit the patient for further workup.  Assessment & Plan:   # Acute on chronic hypoxic respiratory failure # OHS # OSA Recent admit for respiratory distress 2/2 malfunctioning bipap machine, that's apparently fixed but now doesn't have mask that fits her so arrived her hypoxic and in respiratory distress, this is resolved with use  of bipap here - continue bipap here when sleeping - TOC consulted working with SNF to resolve this mask issue, have reached out to [facility] today for update  # Anemia of chronic disease Labs suggestive of chronic disease, recent large volume epistaxis plus daily lab draws likely contributing, hgb stable in the 7s. No melena or other bleeding (nurse saw stool yesterday and was brown) - monitor for  # COPD No exacerbation - home trelegy  # A-fib Rate controlled - home dilt, amio, xarelto   # Hypothyroid - home synthroid   # AKI resolved  # HFpEF Appears compensated, bnp wnl - home lasix   # Neuropathy, chronic pain - home gabapentin , tramadol   # RA - home plaquenil , arava , prednisone   # T2DM - SSI     DVT prophylaxis: xarelto  Code Status: full Family Communication: none at bedside  Level of care: Telemetry Cardiac Status is: inpt    Consultants:  none  Procedures: none  Antimicrobials:  none    Subjective: Reports breathing is at baseline. Tolerating diet  Objective: Vitals:   06/02/24 2346 06/03/24 0416 06/03/24 0807 06/03/24 1401  BP: 104/60 93/61 110/67 (!) 105/59  Pulse: 61 (!) 58 62 80  Resp: 18 18 18    Temp: 98.1 F (36.7 C) 97.6 F (36.4 C) 97.7 F (36.5 C) 99 F (37.2 C)  TempSrc: Oral Oral    SpO2: 100% 100% 100% 96%  Weight:      Height:        Intake/Output Summary (Last 24 hours) at 06/03/2024 1503 Last data filed at 06/03/2024 1100 Gross per 24 hour  Intake 360 ml  Output 450 ml  Net -90 ml  Filed Weights   05/30/24 1434  Weight: 109.2 kg    Examination:  General exam: Appears calm and comfortable, chronically ill Respiratory system: Clear to auscultation. Respiratory effort normal. Cardiovascular system: S1 & S2 heard, irreg, distant Gastrointestinal system: Abdomen is obese, soft and nontender.   Central nervous system: Alert and oriented. No focal neurological deficits. Extremities: Symmetric 5 x 5 power. Skin:  venous stasis changes in LEs Psychiatry: Judgement and insight appear normal. Mood & affect appropriate.     Data Reviewed: I have personally reviewed following labs and imaging studies  CBC: Recent Labs  Lab 05/30/24 1425 05/31/24 0421 06/01/24 0436 06/02/24 0341  WBC 7.9 6.0 5.1 4.2  NEUTROABS 6.1 5.2 3.8 2.8  HGB 7.9* 7.6* 7.2* 7.8*  HCT 25.6* 25.0* 23.2* 25.3*  MCV 99.6 97.3 97.5 99.6  PLT 224 245 234 226   Basic Metabolic Panel: Recent Labs  Lab 05/30/24 1425 05/31/24 0421 06/01/24 0436 06/02/24 0341  NA 133* 136 137 140  K 3.9 4.3 3.7 3.8  CL 90* 93* 94* 97*  CO2 30 31 33* 34*  GLUCOSE 144* 135* 101* 77  BUN 31* 35* 40* 29*  CREATININE 1.22* 1.37* 1.30* 1.00  CALCIUM  9.3 9.1 8.8* 8.9  MG  --  2.1 2.2 2.2  PHOS  --  5.0* 2.8 2.5   GFR: Estimated Creatinine Clearance: 65.6 mL/min (by C-G formula based on SCr of 1 mg/dL). Liver Function Tests: Recent Labs  Lab 05/30/24 1425 05/31/24 0421 06/01/24 0436 06/02/24 0341  AST 19 17 13* 12*  ALT 19 19 16 15   ALKPHOS 59 57 50 52  BILITOT 0.8 0.6 0.7 0.6  PROT 6.4* 6.4* 5.8* 5.6*  ALBUMIN 3.1* 3.1* 2.9* 2.9*   No results for input(s): LIPASE, AMYLASE in the last 168 hours. No results for input(s): AMMONIA in the last 168 hours. Coagulation Profile: No results for input(s): INR, PROTIME in the last 168 hours.  Cardiac Enzymes: No results for input(s): CKTOTAL, CKMB, CKMBINDEX, TROPONINI in the last 168 hours. BNP (last 3 results) No results for input(s): PROBNP in the last 8760 hours. HbA1C: No results for input(s): HGBA1C in the last 72 hours.  CBG: Recent Labs  Lab 06/02/24 1222 06/02/24 1602 06/02/24 2119 06/03/24 0802 06/03/24 1359  GLUCAP 102* 165* 148* 95 209*   Lipid Profile: No results for input(s): CHOL, HDL, LDLCALC, TRIG, CHOLHDL, LDLDIRECT in the last 72 hours. Thyroid  Function Tests: No results for input(s): TSH, T4TOTAL, FREET4, T3FREE,  THYROIDAB in the last 72 hours. Anemia Panel: No results for input(s): VITAMINB12, FOLATE, FERRITIN, TIBC, IRON, RETICCTPCT in the last 72 hours. Urine analysis:    Component Value Date/Time   COLORURINE YELLOW 05/21/2021 2128   APPEARANCEUR CLEAR 05/21/2021 2128   LABSPEC 1.010 05/21/2021 2128   PHURINE 7.0 05/21/2021 2128   GLUCOSEU NEGATIVE 05/21/2021 2128   HGBUR NEGATIVE 05/21/2021 2128   HGBUR small 07/21/2008 1538   BILIRUBINUR NEGATIVE 05/21/2021 2128   KETONESUR NEGATIVE 05/21/2021 2128   PROTEINUR NEGATIVE 05/21/2021 2128   UROBILINOGEN 0.2 04/23/2015 1851   NITRITE NEGATIVE 05/21/2021 2128   LEUKOCYTESUR TRACE (A) 05/21/2021 2128   Sepsis Labs: @LABRCNTIP (procalcitonin:4,lacticidven:4)  ) Recent Results (from the past 240 hours)  Resp panel by RT-PCR (RSV, Flu A&B, Covid) Anterior Nasal Swab     Status: None   Collection Time: 05/30/24  2:25 PM   Specimen: Anterior Nasal Swab  Result Value Ref Range Status   SARS Coronavirus 2 by RT PCR NEGATIVE NEGATIVE Final  Comment: (NOTE) SARS-CoV-2 target nucleic acids are NOT DETECTED.  The SARS-CoV-2 RNA is generally detectable in upper respiratory specimens during the acute phase of infection. The lowest concentration of SARS-CoV-2 viral copies this assay can detect is 138 copies/mL. A negative result does not preclude SARS-Cov-2 infection and should not be used as the sole basis for treatment or other patient management decisions. A negative result may occur with  improper specimen collection/handling, submission of specimen other than nasopharyngeal swab, presence of viral mutation(s) within the areas targeted by this assay, and inadequate number of viral copies(<138 copies/mL). A negative result must be combined with clinical observations, patient history, and epidemiological information. The expected result is Negative.  Fact Sheet for Patients:  BloggerCourse.com  Fact  Sheet for Healthcare Providers:  SeriousBroker.it  This test is no t yet approved or cleared by the [place]  FDA and  has been authorized for detection and/or diagnosis of SARS-CoV-2 by FDA under an Emergency Use Authorization (EUA). This EUA will remain  in effect (meaning this test can be used) for the duration of the COVID-19 declaration under Section 564(b)(1) of the Act, 21 U.S.C.section 360bbb-3(b)(1), unless the authorization is terminated  or revoked sooner.       Influenza A by PCR NEGATIVE NEGATIVE Final   Influenza B by PCR NEGATIVE NEGATIVE Final    Comment: (NOTE) The Xpert Xpress SARS-CoV-2/FLU/RSV plus assay is intended as an aid in the diagnosis of influenza from Nasopharyngeal swab specimens and should not be used as a sole basis for treatment. Nasal washings and aspirates are unacceptable for Xpert Xpress SARS-CoV-2/FLU/RSV testing.  Fact Sheet for Patients: BloggerCourse.com  Fact Sheet for Healthcare Providers: SeriousBroker.it  This test is not yet approved or cleared by the [place]  FDA and has been authorized for detection and/or diagnosis of SARS-CoV-2 by FDA under an Emergency Use Authorization (EUA). This EUA will remain in effect (meaning this test can be used) for the duration of the COVID-19 declaration under Section 564(b)(1) of the Act, 21 U.S.C. section 360bbb-3(b)(1), unless the authorization is terminated or revoked.     Resp Syncytial Virus by PCR NEGATIVE NEGATIVE Final    Comment: (NOTE) Fact Sheet for Patients: BloggerCourse.com  Fact Sheet for Healthcare Providers: SeriousBroker.it  This test is not yet approved or cleared by the [place]  FDA and has been authorized for detection and/or diagnosis of SARS-CoV-2 by FDA under an Emergency Use Authorization (EUA). This EUA will remain in effect  (meaning this test can be used) for the duration of the COVID-19 declaration under Section 564(b)(1) of the Act, 21 U.S.C. section 360bbb-3(b)(1), unless the authorization is terminated or revoked.  Performed at [facility], [address] [place], [place] [address]   Blood culture (routine x 2)     Status: None (Preliminary result)   Collection Time: 05/30/24  2:25 PM   Specimen: BLOOD RIGHT ARM  Result Value Ref Range Status   Specimen Description BLOOD RIGHT ARM  Final   Special Requests   Final    BOTTLES DRAWN AEROBIC AND ANAEROBIC Blood Culture results may not be optimal due to an inadequate volume of blood received in culture bottles   Culture   Final    NO GROWTH 3 DAYS Performed at [facility], [address], [place], [place] [address]    Report Status PENDING  Incomplete  Blood culture (routine x 2)     Status: None (Preliminary result)   Collection Time: 05/30/24  3:01 PM  Specimen: BLOOD RIGHT HAND  Result Value Ref Range Status   Specimen Description BLOOD RIGHT HAND  Final   Special Requests   Final    BOTTLES DRAWN AEROBIC AND ANAEROBIC Blood Culture results may not be optimal due to an inadequate volume of blood received in culture bottles   Culture   Final    NO GROWTH 3 DAYS Performed at [facility], [address] [place], [place] [address]    Report Status PENDING  Incomplete  MRSA Next Gen by PCR, Nasal     Status: None   Collection Time: 05/31/24  5:09 AM   Specimen: Nasal Mucosa; Nasal Swab  Result Value Ref Range Status   MRSA by PCR Next Gen NOT DETECTED NOT DETECTED Final    Comment: (NOTE) The GeneXpert MRSA Assay (FDA approved for NASAL specimens only), is one component of a comprehensive MRSA colonization surveillance program. It is not intended to diagnose MRSA infection nor to guide or monitor treatment for MRSA infections. Test performance is not FDA approved in patients less than 43 years old. Performed  at [facility], [address] [place], [place] [address]          Radiology Studies: No results found.        Scheduled Meds:  amiodarone   200 mg Oral Daily   atorvastatin  10 mg Oral QHS   budesonide   0.5 mg Nebulization BID   diltiazem   360 mg Oral Daily   ferrous sulfate  325 mg Oral Q lunch   gabapentin   400 mg Oral TID   hydroxychloroquine   200 mg Oral Daily   insulin  aspart  0-6 Units Subcutaneous TID WC   levothyroxine   150 mcg Oral QAC breakfast   melatonin  5 mg Oral QHS   montelukast  10 mg Oral Daily   [START ON 06/04/2024] multivitamin with minerals  1 tablet Oral Daily   nicotine  14 mg Transdermal Daily   pantoprazole   40 mg Oral Daily   polyethylene glycol  17 g Oral BID   predniSONE   5 mg Oral Q breakfast   rivaroxaban   20 mg Oral q AM   senna-docusate  2 tablet Oral BID   sertraline   75 mg Oral Daily   Continuous Infusions:   LOS: 3 days     Raymonde Calico, MD [facility]   If 7PM-7AM, please contact night-coverage [email or web address] Password [facility] 06/03/2024, 3:03 PM

## 2024-06-03 NOTE — TOC Progression Note (Signed)
 Transition of Care [facility]) - Progression Note    Patient Details  Name: [name] MRN: [id number] Date of Birth: 09/16/1956  [facility] CM/SW Contact  Baird Bombard, RN Phone Number: 06/03/2024, 2:19 PM  Clinical Narrative:    Spoke with Gena an Tammy in admissions at [facility]. BIPAP that facility uses is not compatible with the a full face mask. Patient currently using an adult  Resmed with full face mask. Mitch from [employer] will reach out to admissions for vendor contact for needed BIPAP and full face mask.           Expected Discharge Plan and Services                                               Social Determinants of Health (SDOH) Interventions SDOH Screenings   Food Insecurity: No Food Insecurity (05/24/2024)  Housing: Low Risk  (05/24/2024)  Transportation Needs: No Transportation Needs (05/24/2024)  Utilities: Not At Risk (05/21/2024)  Financial Resource Strain: High Risk (05/24/2024)  Social Connections: Socially Isolated (05/21/2024)  Tobacco Use: Medium Risk (05/30/2024)    Readmission Risk Interventions     No data to display

## 2024-06-04 DIAGNOSIS — J9601 Acute respiratory failure with hypoxia: Secondary | ICD-10-CM | POA: Diagnosis not present

## 2024-06-04 LAB — CULTURE, BLOOD (ROUTINE X 2)
Culture: NO GROWTH
Culture: NO GROWTH

## 2024-06-04 LAB — GLUCOSE, CAPILLARY
Glucose-Capillary: 85 mg/dL (ref 70–99)
Glucose-Capillary: 120 mg/dL — ABNORMAL HIGH (ref 70–99)
Glucose-Capillary: 144 mg/dL — ABNORMAL HIGH (ref 70–99)
Glucose-Capillary: 155 mg/dL — ABNORMAL HIGH (ref 70–99)

## 2024-06-04 MED ORDER — ACETAMINOPHEN 325 MG PO TABS
650.0000 mg | ORAL_TABLET | Freq: Four times a day (QID) | ORAL | Status: AC | PRN
  Administered 2024-06-04: 650 mg via ORAL
  Filled 2024-06-04: qty 2

## 2024-06-04 NOTE — Plan of Care (Signed)
  Problem: Coping: Goal: Ability to adjust to condition or change in health will improve Outcome: Progressing   Problem: Metabolic: Goal: Ability to maintain appropriate glucose levels will improve Outcome: Progressing   Problem: Tissue Perfusion: Goal: Adequacy of tissue perfusion will improve Outcome: Progressing   Problem: Activity: Goal: Risk for activity intolerance will decrease Outcome: Progressing   Problem: Pain Managment: Goal: General experience of comfort will improve and/or be controlled Outcome: Progressing

## 2024-06-04 NOTE — TOC Progression Note (Signed)
 Transition of Care [facility]) - Progression Note    Patient Details  Name: [name] MRN: [id number] Date of Birth: 05/21/1956  [facility] of [facility] CM/SW Contact  Baird Bombard, RN Phone Number: 06/04/2024, 11:05 AM  Clinical Narrative:    Spoke with Tammy from [facility]. Waiting for facility to obtain BIPAP.         Expected Discharge Plan and Services                                               Social Determinants of Health (SDOH) Interventions SDOH Screenings   Food Insecurity: No Food Insecurity (05/24/2024)  Housing: Low Risk  (05/24/2024)  Transportation Needs: No Transportation Needs (05/24/2024)  Utilities: Not At Risk (05/21/2024)  Financial Resource Strain: High Risk (05/24/2024)  Social Connections: Socially Isolated (05/21/2024)  Tobacco Use: Medium Risk (05/30/2024)    Readmission Risk Interventions     No data to display

## 2024-06-04 NOTE — Progress Notes (Addendum)
 PROGRESS NOTE    [name]  BJY:[id number] DOB: [birth year] DOA: 05/30/2024 PCP: [name], PA-C     Brief Narrative:   [name] is a 68 y.o. female with medical history significant of COPD, rheumatoid arthritis, obesity, CHF, pulmonary embolism, type 2 diabetes, DVT, hypertension, hyperlipidemia, hypothyroidism, chronic respiratory failure on 6 L [place] and nighttime BiPAP, nicotine dependence, atrial fibrillation status post cardioversion May 2025, currently on amiodarone  and Xarelto .  Patient was recently hospitalized 5/5 through 5/16 at [facility] for multifactorial respiratory failure related to CHF and pneumonia.  She was admitted to [facility] on 6/6 through 6/9 due to respiratory failure and epistaxis.  Patient reports that her home BiPAP was broken causing inadequate oxygen at home.  Admission was also complicated by epistaxis secondary to nasal cannula and DOAC. On this admission patient presents obtunded and hypoxic into the 50s.  Reportedly there was issue with patient's breathing device at her living facility and she was placed on oxygen tank which apparently ran out for a prolonged period of time without staff knowledge.  On arrival to the ED patient was hypoxic in the 50%.  Patient required bagging but was alert and responsive.  Patient was placed on BiPAP.  And had improvement. Initial pCO2 81, chest x-ray with interstitial prominence bilaterally and bilateral pleural effusions.  CMP revealed bump in creatinine, hyponatremia, mildly elevated troponin.  Patient was also found to be hypotensive but fluid responsive after 500 cc NS.  We were asked to admit the patient for further workup.  Assessment & Plan:   Acute on chronic hypoxic respiratory failure [facility] OSA Recent admission for respiratory distress 2/2 malfunctioning bipap machine, that's apparently fixed but now doesn't have mask that fits her so arrived her hypoxic and in respiratory distress, this is resolved with use  of bipap here Continue bipap here when sleeping TOC update from today--> Spoke with Tammy from [facility]. Waiting for facility to obtain BIPAP- Disposition pending this  Physical debility: Consult PT/OT  Anemia of chronic disease Hb stable at 7.8 Labs suggestive of chronic disease, recent large volume epistaxis plus daily lab draws likely contributing, hgb stable in the 7s.  No melena / bleeding at this time  COPD: Stable No exacerbation On trelegy  A-fib: Rate controlled Continue diltiazem , amio, xarelto   Hypothyroid: continue synthroid   AKI: resolved  HFpEF: Appears compensated, bnp wnl home lasix   Neuropathy, chronic pain home gabapentin , tramadol   RA: continue home plaquenil , arava , prednisone   T2DM: controlled, cont SSI   DVT prophylaxis: xarelto  Code Status: full Family Communication: none at bedside  Level of care: Telemetry Cardiac Status is: inpt  Consultants:  none  Procedures: none  Antimicrobials:  none    Subjective: Patient reported having concern for worsening generalized weakness and about to fall when she was trying to get out of bed to chair  Breathing is at baseline. Tolerating diet  Objective: Vitals:   06/04/24 0100 06/04/24 0526 06/04/24 0729 06/04/24 0730  BP: 102/67 (!) 122/57 (!) 99/40   Pulse: 69 70 96 70  Resp: 18 (!) 22 17   Temp: 98.1 F (36.7 C) 98.4 F (36.9 C) 98.8 F (37.1 C)   TempSrc: Oral     SpO2: 100% 100% (!) 59% 99%  Weight:      Height:        Intake/Output Summary (Last 24 hours) at 06/04/2024 1041 Last data filed at 06/04/2024 1038 Gross per 24 hour  Intake 1320 ml  Output 500 ml  Net 820 ml   Filed Weights   05/30/24 1434  Weight: 109.2 kg    Examination:  General exam: NAD, chronically ill Respiratory system: Clear to auscultation. Respiratory effort normal. Cardiovascular system: S1 & S2 heard, irreg, distant Gastrointestinal system: Abdomen is obese, soft and nontender.   Central nervous  system: Alert and oriented. No focal neurological deficits. Extremities: no deformities/ wounds on extremities Skin: venous stasis changes in LEs Psychiatry: Judgement and insight appear normal. Mood & affect appropriate.   Data Reviewed: I have personally reviewed following labs and imaging studies  CBC: Recent Labs  Lab 05/30/24 1425 05/31/24 0421 06/01/24 0436 06/02/24 0341  WBC 7.9 6.0 5.1 4.2  NEUTROABS 6.1 5.2 3.8 2.8  HGB 7.9* 7.6* 7.2* 7.8*  HCT 25.6* 25.0* 23.2* 25.3*  MCV 99.6 97.3 97.5 99.6  PLT 224 245 234 226   Basic Metabolic Panel: Recent Labs  Lab 05/30/24 1425 05/31/24 0421 06/01/24 0436 06/02/24 0341  NA 133* 136 137 140  K 3.9 4.3 3.7 3.8  CL 90* 93* 94* 97*  CO2 30 31 33* 34*  GLUCOSE 144* 135* 101* 77  BUN 31* 35* 40* 29*  CREATININE 1.22* 1.37* 1.30* 1.00  CALCIUM  9.3 9.1 8.8* 8.9  MG  --  2.1 2.2 2.2  PHOS  --  5.0* 2.8 2.5   GFR: Estimated Creatinine Clearance: 65.6 mL/min (by C-G formula based on SCr of 1 mg/dL). Liver Function Tests: Recent Labs  Lab 05/30/24 1425 05/31/24 0421 06/01/24 0436 06/02/24 0341  AST 19 17 13* 12*  ALT 19 19 16 15   ALKPHOS 59 57 50 52  BILITOT 0.8 0.6 0.7 0.6  PROT 6.4* 6.4* 5.8* 5.6*  ALBUMIN 3.1* 3.1* 2.9* 2.9*   No results for input(s): LIPASE, AMYLASE in the last 168 hours. No results for input(s): AMMONIA in the last 168 hours. Coagulation Profile: No results for input(s): INR, PROTIME in the last 168 hours.  Cardiac Enzymes: No results for input(s): CKTOTAL, CKMB, CKMBINDEX, TROPONINI in the last 168 hours. BNP (last 3 results) No results for input(s): PROBNP in the last 8760 hours. HbA1C: No results for input(s): HGBA1C in the last 72 hours.  CBG: Recent Labs  Lab 06/03/24 0802 06/03/24 1359 06/03/24 1716 06/03/24 2116 06/04/24 0728  GLUCAP 95 209* 102* 105* 85   Lipid Profile: No results for input(s): CHOL, HDL, LDLCALC, TRIG, CHOLHDL, LDLDIRECT  in the last 72 hours. Thyroid  Function Tests: No results for input(s): TSH, T4TOTAL, FREET4, T3FREE, THYROIDAB in the last 72 hours. Anemia Panel: No results for input(s): VITAMINB12, FOLATE, FERRITIN, TIBC, IRON, RETICCTPCT in the last 72 hours. Urine analysis:    Component Value Date/Time   COLORURINE YELLOW 05/21/2021 2128   APPEARANCEUR CLEAR 05/21/2021 2128   LABSPEC 1.010 05/21/2021 2128   PHURINE 7.0 05/21/2021 2128   GLUCOSEU NEGATIVE 05/21/2021 2128   HGBUR NEGATIVE 05/21/2021 2128   HGBUR small 07/21/2008 1538   BILIRUBINUR NEGATIVE 05/21/2021 2128   KETONESUR NEGATIVE 05/21/2021 2128   PROTEINUR NEGATIVE 05/21/2021 2128   UROBILINOGEN 0.2 04/23/2015 1851   NITRITE NEGATIVE 05/21/2021 2128   LEUKOCYTESUR TRACE (A) 05/21/2021 2128   Sepsis Labs: @LABRCNTIP (procalcitonin:4,lacticidven:4)  ) Recent Results (from the past 240 hours)  Resp panel by RT-PCR (RSV, Flu A&B, Covid) Anterior Nasal Swab     Status: None   Collection Time: 05/30/24  2:25 PM   Specimen: Anterior Nasal Swab  Result Value Ref Range Status   SARS Coronavirus 2 by RT PCR NEGATIVE NEGATIVE Final  Comment: (NOTE) SARS-CoV-2 target nucleic acids are NOT DETECTED.  The SARS-CoV-2 RNA is generally detectable in upper respiratory specimens during the acute phase of infection. The lowest concentration of SARS-CoV-2 viral copies this assay can detect is 138 copies/mL. A negative result does not preclude SARS-Cov-2 infection and should not be used as the sole basis for treatment or other patient management decisions. A negative result may occur with  improper specimen collection/handling, submission of specimen other than nasopharyngeal swab, presence of viral mutation(s) within the areas targeted by this assay, and inadequate number of viral copies(<138 copies/mL). A negative result must be combined with clinical observations, patient history, and epidemiological information. The  expected result is Negative.  Fact Sheet for Patients:  BloggerCourse.com  Fact Sheet for Healthcare Providers:  SeriousBroker.it  This test is no t yet approved or cleared by the [place]  FDA and  has been authorized for detection and/or diagnosis of SARS-CoV-2 by FDA under an Emergency Use Authorization (EUA). This EUA will remain  in effect (meaning this test can be used) for the duration of the COVID-19 declaration under Section 564(b)(1) of the Act, 21 U.S.C.section 360bbb-3(b)(1), unless the authorization is terminated  or revoked sooner.       Influenza A by PCR NEGATIVE NEGATIVE Final   Influenza B by PCR NEGATIVE NEGATIVE Final    Comment: (NOTE) The Xpert Xpress SARS-CoV-2/FLU/RSV plus assay is intended as an aid in the diagnosis of influenza from Nasopharyngeal swab specimens and should not be used as a sole basis for treatment. Nasal washings and aspirates are unacceptable for Xpert Xpress SARS-CoV-2/FLU/RSV testing.  Fact Sheet for Patients: BloggerCourse.com  Fact Sheet for Healthcare Providers: SeriousBroker.it  This test is not yet approved or cleared by the [place]  FDA and has been authorized for detection and/or diagnosis of SARS-CoV-2 by FDA under an Emergency Use Authorization (EUA). This EUA will remain in effect (meaning this test can be used) for the duration of the COVID-19 declaration under Section 564(b)(1) of the Act, 21 U.S.C. section 360bbb-3(b)(1), unless the authorization is terminated or revoked.     Resp Syncytial Virus by PCR NEGATIVE NEGATIVE Final    Comment: (NOTE) Fact Sheet for Patients: BloggerCourse.com  Fact Sheet for Healthcare Providers: SeriousBroker.it  This test is not yet approved or cleared by the [place]  FDA and has been authorized for detection and/or  diagnosis of SARS-CoV-2 by FDA under an Emergency Use Authorization (EUA). This EUA will remain in effect (meaning this test can be used) for the duration of the COVID-19 declaration under Section 564(b)(1) of the Act, 21 U.S.C. section 360bbb-3(b)(1), unless the authorization is terminated or revoked.  Performed at [facility], [address] [place], [place] [address]   Blood culture (routine x 2)     Status: None   Collection Time: 05/30/24  2:25 PM   Specimen: BLOOD RIGHT ARM  Result Value Ref Range Status   Specimen Description BLOOD RIGHT ARM  Final   Special Requests   Final    BOTTLES DRAWN AEROBIC AND ANAEROBIC Blood Culture results may not be optimal due to an inadequate volume of blood received in culture bottles   Culture   Final    NO GROWTH 5 DAYS Performed at [facility], [address] [place], [place] [address]    Report Status 06/04/2024 FINAL  Final  Blood culture (routine x 2)     Status: None   Collection Time: 05/30/24  3:01 PM   Specimen: BLOOD  RIGHT HAND  Result Value Ref Range Status   Specimen Description BLOOD RIGHT HAND  Final   Special Requests   Final    BOTTLES DRAWN AEROBIC AND ANAEROBIC Blood Culture results may not be optimal due to an inadequate volume of blood received in culture bottles   Culture   Final    NO GROWTH 5 DAYS Performed at [facility], [address] [place], [place] [address]    Report Status 06/04/2024 FINAL  Final  MRSA Next Gen by PCR, Nasal     Status: None   Collection Time: 05/31/24  5:09 AM   Specimen: Nasal Mucosa; Nasal Swab  Result Value Ref Range Status   MRSA by PCR Next Gen NOT DETECTED NOT DETECTED Final    Comment: (NOTE) The GeneXpert MRSA Assay (FDA approved for NASAL specimens only), is one component of a comprehensive MRSA colonization surveillance program. It is not intended to diagnose MRSA infection nor to guide or monitor treatment for MRSA infections. Test  performance is not FDA approved in patients less than 40 years old. Performed at [facility], [address], [place], [place] [address]          Radiology Studies: No results found.        Scheduled Meds:  amiodarone   200 mg Oral Daily   atorvastatin  10 mg Oral QHS   budesonide   0.5 mg Nebulization BID   diltiazem   360 mg Oral Daily   ferrous sulfate  325 mg Oral Q lunch   gabapentin   400 mg Oral TID   hydroxychloroquine   200 mg Oral Daily   insulin  aspart  0-6 Units Subcutaneous TID WC   levothyroxine   150 mcg Oral QAC breakfast   melatonin  5 mg Oral QHS   montelukast  10 mg Oral Daily   multivitamin with minerals  1 tablet Oral Daily   nicotine  14 mg Transdermal Daily   pantoprazole   40 mg Oral Daily   polyethylene glycol  17 g Oral BID   predniSONE   5 mg Oral Q breakfast   rivaroxaban   20 mg Oral q AM   senna-docusate  2 tablet Oral BID   sertraline   75 mg Oral Daily   Continuous Infusions:   LOS: 4 days     Suzan Erm, MD [facility]   If 7PM-7AM, please contact night-coverage [email or web address] Password [facility] 06/04/2024, 10:41 AM

## 2024-06-05 DIAGNOSIS — J9601 Acute respiratory failure with hypoxia: Secondary | ICD-10-CM | POA: Diagnosis not present

## 2024-06-05 LAB — GLUCOSE, CAPILLARY
Glucose-Capillary: 94 mg/dL (ref 70–99)
Glucose-Capillary: 110 mg/dL — ABNORMAL HIGH (ref 70–99)
Glucose-Capillary: 126 mg/dL — ABNORMAL HIGH (ref 70–99)
Glucose-Capillary: 148 mg/dL — ABNORMAL HIGH (ref 70–99)

## 2024-06-05 MED ORDER — DILTIAZEM HCL ER COATED BEADS 120 MG PO CP24
240.0000 mg | ORAL_CAPSULE | Freq: Every day | ORAL | Status: DC
  Administered 2024-06-05 – 2024-06-07 (×3): 240 mg via ORAL
  Filled 2024-06-05 (×4): qty 2

## 2024-06-05 NOTE — Evaluation (Signed)
 Occupational Therapy Re-evaluation Patient Details Name: [name] MRN: [id number] DOB: 1956/09/03 Today's Date: 06/05/2024   History of Present Illness   Pt is a 68 y.o. female presents obtunded and hypoxic d/t being off 02 without staff knowledge at facility. Admitted for management of Acute on chronic hypoxic and hypercapnic respiratory failure, hypotension, hynoatremia, and AKI. PMH of COPD, rheumatoid arthritis, obesity, CHF, pulmonary embolism, type 2 diabetes, DVT, hypertension, hyperlipidemia, hypothyroidism, chronic respiratory failure on 6 L [place] and nighttime BiPAP, nicotine  dependence, atrial fibrillation s/p cardioversion May 2025. Two recent hospitalizations for respiratory failure related to CHF and pneumonia, respiratory failure and epistaxis 2/2 to [place] and DOAC.     Clinical Impressions Chart reviewed to date, re-evaluation completed after OT orders discontinued on 6/18, re ordered 6/20. Pt reports she has had an increased need for physical assist while transferring the last few days. Pt also reports she was last at her previous baseline in April, has been hospitalized numerous times and intended to move to [facility] to be closer to family (was in a different SNF prior to that). She has been at [facility] a total of 6 days per her report. Pt reports she previously could amb to the bathroom with RW, had PRN assist for ADLs. Required MWC for longer distances. On this date, pt presents with deficits in strength, endurance, activity tolerance, balance, affecting safe and optimal ADL completion. Pt unable to clear buttocks off the bed while attempting STS with RW (a decrease in function since previous eval)- therefore stedy  utilized with pt performing multiple STS with CGA. MAX A required for LB dressing. Sheets noted to be soiled, therefore provided linen change and clean up to pt (MAX A for peri care). Pt will benefit from OT while admitted to facilitate decrease regression of functional  status. Pt is left in chair, all needs met. OT will follow acutely.      If plan is discharge home, recommend the following:   Assist for transportation;Assistance with cooking/housework;Help with stairs or ramp for entrance;A lot of help with walking and/or transfers;A lot of help with bathing/dressing/bathroom     Functional Status Assessment   Patient has had a recent decline in their functional status and demonstrates the ability to make significant improvements in function in a reasonable and predictable amount of time.     Equipment Recommendations   Other (comment) (defer to next venue of care)     Recommendations for Other Services         Precautions/Restrictions   Precautions Precautions: Fall Recall of Precautions/Restrictions: Intact Restrictions Weight Bearing Restrictions Per Provider Order: No     Mobility Bed Mobility               General bed mobility comments: NT, on edge of bed with PT as OT entered room    Transfers Overall transfer level: Needs assistance Equipment used: Ambulation equipment used Transfers: Sit to/from Stand, Bed to chair/wheelchair/BSC Sit to Stand: Contact guard assist, Via lift equipment (multiple attempts with stedy- pt pulls up on anterior bars with CGA)           General transfer comment: attempted STS from edge of bed- pt unable to clear buttocks and appears anxious- she reports she has had a difficult time transferring to<>from chair the last few days and staff all but had to pick me up yesterday      Balance Overall balance assessment: Needs assistance Sitting-balance support: Bilateral upper extremity supported, Feet supported Sitting balance-Leahy  Scale: Good     Standing balance support: Bilateral upper extremity supported, Reliant on assistive device for balance, During functional activity Standing balance-Leahy Scale: Zero                             ADL either performed or  assessed with clinical judgement   ADL Overall ADL's : Needs assistance/impaired Eating/Feeding: Set up;Sitting               Upper Body Dressing : Moderate assistance;Sitting   Lower Body Dressing: Total assistance;Bed level     Toilet Transfer Details (indicate cue type and reason): TOTAL A+2 with stedy on this date Toileting- Clothing Manipulation and Hygiene: Sit to/from stand;Maximal assistance               Vision Patient Visual Report: No change from baseline       Perception         Praxis         Pertinent Vitals/Pain Pain Assessment Pain Assessment: Faces Faces Pain Scale: Hurts little more Pain Location: bilateral knees, especially with attempts to stand Pain Descriptors / Indicators: Discomfort, Moaning Pain Intervention(s): Monitored during session, Limited activity within patient's tolerance, Repositioned     Extremity/Trunk Assessment Upper Extremity Assessment Upper Extremity Assessment: Generalized weakness   Lower Extremity Assessment Lower Extremity Assessment: Generalized weakness   Cervical / Trunk Assessment Cervical / Trunk Assessment: Normal   Communication Communication Communication: No apparent difficulties   Cognition Arousal: Alert Behavior During Therapy: WFL for tasks assessed/performed, Anxious Cognition: No apparent impairments                               Following commands: Intact       Cueing  General Comments   Cueing Techniques: Verbal cues;Tactile cues  pt on 6L via HFNC, to 83% with attempted mobility, recovered to 88-90% with rest and PLB; noted to have bloody nose and blood near buttocks/sacrum (from scratching); pt cleaned, notified nurse   Exercises Other Exercises Other Exercises: edu re: role of OT, role of rehab, discharge recommendations, use of DME/AE for safe ADL completion   Shoulder Instructions      Home Living Family/patient expects to be discharged to:: Skilled nursing  facility                                 Additional Comments: Patient lives at [facility] where she recent moved to be closer to her family, resided for a few days total between admissions, prior to that she lived in a different SNF for at least 3 years      Prior Functioning/Environment Prior Level of Function : Needs assist       Physical Assist : Mobility (physical);ADLs (physical) Mobility (physical): Bed mobility;Transfers;Gait;Stairs ADLs (physical): Grooming;Dressing;Bathing;IADLs;Toileting Mobility Comments: pt states as recently as April she used a RW for short distances and WC for longer distances, unable to self propel. Reports fall two days ago in the hospital where her knees gave out while transferring and a near fall with the same situation yesterday. ADLs Comments: pt states prior to April she needed assistance with IADLs and showering but can go to the bathroom herself.    OT Problem List: Decreased activity tolerance;Decreased strength;Impaired balance (sitting and/or standing)   OT Treatment/Interventions: Self-care/ADL training;Therapeutic exercise;Therapeutic activities;Energy conservation;DME and/or  AE instruction;Patient/family education;Balance training      OT Goals(Current goals can be found in the care plan section)   Acute Rehab OT Goals Patient Stated Goal: improve function OT Goal Formulation: With patient Time For Goal Achievement: 06/19/24 Potential to Achieve Goals: Good   OT Frequency:  Min 2X/week    Co-evaluation PT/OT/SLP Co-Evaluation/Treatment: Yes Reason for Co-Treatment: Complexity of the patient's impairments (multi-system involvement);For patient/therapist safety;To address functional/ADL transfers PT goals addressed during session: Mobility/safety with mobility;Strengthening/ROM OT goals addressed during session: ADL's and self-care      AM-PAC OT 6 Clicks Daily Activity     Outcome Measure Help from another  person eating meals?: None Help from another person taking care of personal grooming?: A Little Help from another person toileting, which includes using toliet, bedpan, or urinal?: A Lot Help from another person bathing (including washing, rinsing, drying)?: A Lot Help from another person to put on and taking off regular upper body clothing?: A Little Help from another person to put on and taking off regular lower body clothing?: A Lot 6 Click Score: 16   End of Session Equipment Utilized During Treatment: Rolling walker (2 wheels);Oxygen;Other (comment) (stedy) Nurse Communication: Mobility status  Activity Tolerance: Patient tolerated treatment well Patient left: in chair;with call bell/phone within reach;with chair alarm set  OT Visit Diagnosis: Other abnormalities of gait and mobility (R26.89)                Time: 8857-8792 OT Time Calculation (min): 25 min Charges:  OT General Charges $OT Visit: 1 Visit OT Evaluation $OT Re-eval: 1 Re-eval OT Treatments $Therapeutic Activity: 8-22 mins Therisa Sheffield, OTD OTR/L  06/05/24, 1:30 PM

## 2024-06-05 NOTE — Progress Notes (Signed)
 PROGRESS NOTE    [name]  FMW:[id number] DOB: February 10, 1956 DOA: 05/30/2024 PCP: [name] DASEN, PA-C     Brief Narrative:   [name] is a 68 y.o. female with medical history significant of COPD, rheumatoid arthritis, obesity, CHF, pulmonary embolism, type 2 diabetes, DVT, hypertension, hyperlipidemia, hypothyroidism, chronic respiratory failure on 6 L [place] and nighttime BiPAP, nicotine  dependence, atrial fibrillation status post cardioversion May 2025, currently on amiodarone  and Xarelto .  Patient was recently hospitalized 5/5 through 5/16 at [facility] for multifactorial respiratory failure related to CHF and pneumonia.  She was admitted to [facility] on 6/6 through 6/9 due to respiratory failure and epistaxis.  Patient reports that her home BiPAP was broken causing inadequate oxygen at home.  Admission was also complicated by epistaxis secondary to nasal cannula and DOAC. On this admission patient presents obtunded and hypoxic into the 50s.  Reportedly there was issue with patient's breathing device at her living facility and she was placed on oxygen tank which apparently ran out for a prolonged period of time without staff knowledge.  On arrival to the ED patient was hypoxic in the 50%.  Patient required bagging but was alert and responsive.  Patient was placed on BiPAP.  And had improvement. Initial pCO2 81, chest x-ray with interstitial prominence bilaterally and bilateral pleural effusions.  CMP revealed bump in creatinine, hyponatremia, mildly elevated troponin.  Patient was also found to be hypotensive but fluid responsive after 500 cc NS.  We were asked to admit the patient for further workup.  Assessment & Plan:   # Acute on chronic hypoxic respiratory failure # OHS # OSA Recent admit for respiratory distress 2/2 malfunctioning bipap machine, that's apparently fixed but now doesn't have mask that fits her so arrived her hypoxic and in respiratory distress, this is resolved with use  of bipap here - continue bipap here when sleeping - TOC consulted working with SNF to resolve this mask issue, as of yesterday SNF still waiting for the replacement part  # Anemia of chronic disease Labs suggestive of chronic disease, recent large volume epistaxis plus daily lab draws likely contributing, hgb stable in the 7s. No melena or other bleeding (nurse saw stool yesterday and was brown) - monitor for now, repeat hgb tomorrow  # COPD No exacerbation - home trelegy  # A-fib Rate controlled but BPs soft - home dilt, amio, xarelto , will reduce dose of dilt from 360 to 240  # Hypothyroid - home synthroid   # AKI Resolved - monitor kidney function intermittently  # HFpEF Appears compensated, bnp wnl - home lasix   # Neuropathy, chronic pain Stable - home gabapentin , tramadol   # RA Stable - home plaquenil , arava , prednisone   # T2DM Controlled - SSI     DVT prophylaxis: xarelto  Code Status: full Family Communication: daughter Delon updated telephonically 6/21  Level of care: Telemetry Cardiac Status is: inpt    Consultants:  none  Procedures: none  Antimicrobials:  none    Subjective: Reports breathing is at baseline. Tolerating diet. Regular BMs  Objective: Vitals:   06/04/24 2012 06/04/24 2353 06/05/24 0356 06/05/24 0835  BP: (!) 116/58 111/66 (!) 120/51 99/63  Pulse: 75 67 76 73  Resp: 18 18 18    Temp: 98.5 F (36.9 C) 97.9 F (36.6 C) 97.9 F (36.6 C) 97.8 F (36.6 C)  TempSrc:    Oral  SpO2: 100% 100% 97% 100%  Weight:      Height:  Intake/Output Summary (Last 24 hours) at 06/05/2024 0852 Last data filed at 06/04/2024 2240 Gross per 24 hour  Intake 714 ml  Output 600 ml  Net 114 ml   Filed Weights   05/30/24 1434  Weight: 109.2 kg    Examination:  General exam: Appears calm and comfortable, chronically ill Respiratory system: Clear to auscultation. Respiratory effort normal. Cardiovascular system: S1 & S2  heard, irreg, distant Gastrointestinal system: Abdomen is obese, soft and nontender.   Central nervous system: Alert and oriented. No focal neurological deficits. Extremities: Symmetric 5 x 5 power. Skin: venous stasis changes in LEs Psychiatry: Judgement and insight appear normal. Mood & affect appropriate.     Data Reviewed: I have personally reviewed following labs and imaging studies  CBC: Recent Labs  Lab 05/30/24 1425 05/31/24 0421 06/01/24 0436 06/02/24 0341  WBC 7.9 6.0 5.1 4.2  NEUTROABS 6.1 5.2 3.8 2.8  HGB 7.9* 7.6* 7.2* 7.8*  HCT 25.6* 25.0* 23.2* 25.3*  MCV 99.6 97.3 97.5 99.6  PLT 224 245 234 226   Basic Metabolic Panel: Recent Labs  Lab 05/30/24 1425 05/31/24 0421 06/01/24 0436 06/02/24 0341  NA 133* 136 137 140  K 3.9 4.3 3.7 3.8  CL 90* 93* 94* 97*  CO2 30 31 33* 34*  GLUCOSE 144* 135* 101* 77  BUN 31* 35* 40* 29*  CREATININE 1.22* 1.37* 1.30* 1.00  CALCIUM  9.3 9.1 8.8* 8.9  MG  --  2.1 2.2 2.2  PHOS  --  5.0* 2.8 2.5   GFR: Estimated Creatinine Clearance: 65.6 mL/min (by C-G formula based on SCr of 1 mg/dL). Liver Function Tests: Recent Labs  Lab 05/30/24 1425 05/31/24 0421 06/01/24 0436 06/02/24 0341  AST 19 17 13* 12*  ALT 19 19 16 15   ALKPHOS 59 57 50 52  BILITOT 0.8 0.6 0.7 0.6  PROT 6.4* 6.4* 5.8* 5.6*  ALBUMIN 3.1* 3.1* 2.9* 2.9*   No results for input(s): LIPASE, AMYLASE in the last 168 hours. No results for input(s): AMMONIA in the last 168 hours. Coagulation Profile: No results for input(s): INR, PROTIME in the last 168 hours.  Cardiac Enzymes: No results for input(s): CKTOTAL, CKMB, CKMBINDEX, TROPONINI in the last 168 hours. BNP (last 3 results) No results for input(s): PROBNP in the last 8760 hours. HbA1C: No results for input(s): HGBA1C in the last 72 hours.  CBG: Recent Labs  Lab 06/04/24 0728 06/04/24 1138 06/04/24 1638 06/04/24 2121 06/05/24 0833  GLUCAP 85 120* 144* 155* 94    Lipid Profile: No results for input(s): CHOL, HDL, LDLCALC, TRIG, CHOLHDL, LDLDIRECT in the last 72 hours. Thyroid  Function Tests: No results for input(s): TSH, T4TOTAL, FREET4, T3FREE, THYROIDAB in the last 72 hours. Anemia Panel: No results for input(s): VITAMINB12, FOLATE, FERRITIN, TIBC, IRON, RETICCTPCT in the last 72 hours. Urine analysis:    Component Value Date/Time   COLORURINE YELLOW 05/21/2021 2128   APPEARANCEUR CLEAR 05/21/2021 2128   LABSPEC 1.010 05/21/2021 2128   PHURINE 7.0 05/21/2021 2128   GLUCOSEU NEGATIVE 05/21/2021 2128   HGBUR NEGATIVE 05/21/2021 2128   HGBUR small 07/21/2008 1538   BILIRUBINUR NEGATIVE 05/21/2021 2128   KETONESUR NEGATIVE 05/21/2021 2128   PROTEINUR NEGATIVE 05/21/2021 2128   UROBILINOGEN 0.2 04/23/2015 1851   NITRITE NEGATIVE 05/21/2021 2128   LEUKOCYTESUR TRACE (A) 05/21/2021 2128   Sepsis Labs: @LABRCNTIP (procalcitonin:4,lacticidven:4)  ) Recent Results (from the past 240 hours)  Resp panel by RT-PCR (RSV, Flu A&B, Covid) Anterior Nasal Swab     Status:  None   Collection Time: 05/30/24  2:25 PM   Specimen: Anterior Nasal Swab  Result Value Ref Range Status   SARS Coronavirus 2 by RT PCR NEGATIVE NEGATIVE Final    Comment: (NOTE) SARS-CoV-2 target nucleic acids are NOT DETECTED.  The SARS-CoV-2 RNA is generally detectable in upper respiratory specimens during the acute phase of infection. The lowest concentration of SARS-CoV-2 viral copies this assay can detect is 138 copies/mL. A negative result does not preclude SARS-Cov-2 infection and should not be used as the sole basis for treatment or other patient management decisions. A negative result may occur with  improper specimen collection/handling, submission of specimen other than nasopharyngeal swab, presence of viral mutation(s) within the areas targeted by this assay, and inadequate number of viral copies(<138 copies/mL). A negative  result must be combined with clinical observations, patient history, and epidemiological information. The expected result is Negative.  Fact Sheet for Patients:  BloggerCourse.com  Fact Sheet for Healthcare Providers:  SeriousBroker.it  This test is no t yet approved or cleared by the [place]  FDA and  has been authorized for detection and/or diagnosis of SARS-CoV-2 by FDA under an Emergency Use Authorization (EUA). This EUA will remain  in effect (meaning this test can be used) for the duration of the COVID-19 declaration under Section 564(b)(1) of the Act, 21 U.S.C.section 360bbb-3(b)(1), unless the authorization is terminated  or revoked sooner.       Influenza A by PCR NEGATIVE NEGATIVE Final   Influenza B by PCR NEGATIVE NEGATIVE Final    Comment: (NOTE) The Xpert Xpress SARS-CoV-2/FLU/RSV plus assay is intended as an aid in the diagnosis of influenza from Nasopharyngeal swab specimens and should not be used as a sole basis for treatment. Nasal washings and aspirates are unacceptable for Xpert Xpress SARS-CoV-2/FLU/RSV testing.  Fact Sheet for Patients: BloggerCourse.com  Fact Sheet for Healthcare Providers: SeriousBroker.it  This test is not yet approved or cleared by the [place]  FDA and has been authorized for detection and/or diagnosis of SARS-CoV-2 by FDA under an Emergency Use Authorization (EUA). This EUA will remain in effect (meaning this test can be used) for the duration of the COVID-19 declaration under Section 564(b)(1) of the Act, 21 U.S.C. section 360bbb-3(b)(1), unless the authorization is terminated or revoked.     Resp Syncytial Virus by PCR NEGATIVE NEGATIVE Final    Comment: (NOTE) Fact Sheet for Patients: BloggerCourse.com  Fact Sheet for Healthcare Providers: SeriousBroker.it  This  test is not yet approved or cleared by the [place]  FDA and has been authorized for detection and/or diagnosis of SARS-CoV-2 by FDA under an Emergency Use Authorization (EUA). This EUA will remain in effect (meaning this test can be used) for the duration of the COVID-19 declaration under Section 564(b)(1) of the Act, 21 U.S.C. section 360bbb-3(b)(1), unless the authorization is terminated or revoked.  Performed at [facility], [address] [place], [place] [address]   Blood culture (routine x 2)     Status: None   Collection Time: 05/30/24  2:25 PM   Specimen: BLOOD RIGHT ARM  Result Value Ref Range Status   Specimen Description BLOOD RIGHT ARM  Final   Special Requests   Final    BOTTLES DRAWN AEROBIC AND ANAEROBIC Blood Culture results may not be optimal due to an inadequate volume of blood received in culture bottles   Culture   Final    NO GROWTH 5 DAYS Performed at [facility], [address] [place],  [place] [address]    Report Status 06/04/2024 FINAL  Final  Blood culture (routine x 2)     Status: None   Collection Time: 05/30/24  3:01 PM   Specimen: BLOOD RIGHT HAND  Result Value Ref Range Status   Specimen Description BLOOD RIGHT HAND  Final   Special Requests   Final    BOTTLES DRAWN AEROBIC AND ANAEROBIC Blood Culture results may not be optimal due to an inadequate volume of blood received in culture bottles   Culture   Final    NO GROWTH 5 DAYS Performed at [facility], [address], [place], [place] [address]    Report Status 06/04/2024 FINAL  Final  MRSA Next Gen by PCR, Nasal     Status: None   Collection Time: 05/31/24  5:09 AM   Specimen: Nasal Mucosa; Nasal Swab  Result Value Ref Range Status   MRSA by PCR Next Gen NOT DETECTED NOT DETECTED Final    Comment: (NOTE) The GeneXpert MRSA Assay (FDA approved for NASAL specimens only), is one component of a comprehensive MRSA colonization surveillance program. It is  not intended to diagnose MRSA infection nor to guide or monitor treatment for MRSA infections. Test performance is not FDA approved in patients less than 57 years old. Performed at [facility], [address] [place], [place] [address]          Radiology Studies: No results found.        Scheduled Meds:  amiodarone   200 mg Oral Daily   atorvastatin   10 mg Oral QHS   budesonide   0.5 mg Nebulization BID   diltiazem   360 mg Oral Daily   ferrous sulfate   325 mg Oral Q lunch   gabapentin   400 mg Oral TID   hydroxychloroquine   200 mg Oral Daily   insulin  aspart  0-6 Units Subcutaneous TID WC   levothyroxine   150 mcg Oral QAC breakfast   melatonin  5 mg Oral QHS   montelukast   10 mg Oral Daily   multivitamin with minerals  1 tablet Oral Daily   pantoprazole   40 mg Oral Daily   polyethylene glycol  17 g Oral BID   predniSONE   5 mg Oral Q breakfast   rivaroxaban   20 mg Oral q AM   senna-docusate  2 tablet Oral BID   sertraline   75 mg Oral Daily   Continuous Infusions:   LOS: 5 days     Devaughn KATHEE Ban, MD [facility]   If 7PM-7AM, please contact night-coverage [email or web address] Password [facility] 06/05/2024, 8:52 AM

## 2024-06-05 NOTE — Plan of Care (Signed)
   Problem: Metabolic: Goal: Ability to maintain appropriate glucose levels will improve Outcome: Progressing

## 2024-06-05 NOTE — Evaluation (Signed)
 Physical Therapy Re-Evaluation Patient Details Name: [name] MRN: [id number] DOB: [birth year] Today's Date: 06/05/2024  History of Present Illness  Pt is a 68 y.o. female presents obtunded and hypoxic d/t being off 02 without staff knowledge at facility. Admitted for management of Acute on chronic hypoxic and hypercapnic respiratory failure, hypotension, hynoatremia, and AKI. PMH of COPD, rheumatoid arthritis, obesity, CHF, pulmonary embolism, type 2 diabetes, DVT, hypertension, hyperlipidemia, hypothyroidism, chronic respiratory failure on 6 L [place] and nighttime BiPAP, nicotine  dependence, atrial fibrillation s/p cardioversion May 2025. Two recent hospitalizations for respiratory failure related to CHF and pneumonia, respiratory failure and epistaxis 2/2 to [place] and DOAC.    Clinical Impression  PT Re-Evaluation today after PT re-ordered by MD following PT order being discontinued earlier in the week. Patient also had a fall two days ago in the hospital since PT was discontinued. Patient received reclining in bed with head of bed very upright on 6L/min HFNC (utilized throughout session). Patient has been living in a SNF for the past several yeas, but recently moved to [facility] to be closer to family. She was last at her usual baseline in April and has been hospitalized multiple times since then. At baseline, she needed assistance for ADLs and IADLs but could use the bathroom herself. She ambulated short distances with a RW and went longer distances in a W/C that someone pushed for her. She reports history of falls, the most recent of which was two days ago at the hospital when her legs buckled while transferring from the chair to the bed. She also reports nearly falling again this way yesterday. Upon Re-Evaluation today, patient moved supine to sit with supervision and head of bed almost maximally elevated. She also needed help from PT to move blankets off of her legs and don socks. She was  unsuccessful with attempt to complete sit <> Stand at edge of bed with RW and Min A. She then transferred bed to chair using Camie Provost with CGA +2 for safety (PT and OT) and clinicians operating the equipment. She demonstrates a decline in functional mobility from baseline and even since her last PT treatment session earlier in the week. SpO2 dropped as low as 83% but returned to 88-90% with rest breaks focused on PLB. Patient would benefit from skilled physical therapy to address impairments and functional limitations (see PT Problem List below) to work towards stated goals and return to PLOF or maximal functional independence. Recommend Camie Steady for transfers at this time.        If plan is discharge home, recommend the following: Assistance with cooking/housework;Assist for transportation;Help with stairs or ramp for entrance;Two people to help with walking and/or transfers;Two people to help with bathing/dressing/bathroom   Can travel by private vehicle   No    Equipment Recommendations Other (comment) (Defer to next venue of care)  Recommendations for Other Services       Functional Status Assessment Patient has had a recent decline in their functional status and demonstrates the ability to make significant improvements in function in a reasonable and predictable amount of time.     Precautions / Restrictions Precautions Precautions: Fall Restrictions Weight Bearing Restrictions Per Provider Order: No      Mobility  Bed Mobility Overal bed mobility: Needs Assistance Bed Mobility: Sit to Supine     Supine to sit: HOB elevated, Supervision          Transfers Overall transfer level: Needs assistance Equipment used:  Jayson Steady) Transfers:  Bed to chair/wheelchair/BSC, Sit to/from Stand Sit to Stand: Contact guard assist, Via lift equipment           General transfer comment: Attempted sit <> stand at edge of bed with RW but unable to come to rise. Patient then  completed sit <> stand with CGA +2 using Camie Steady from bed to chair. Transfer via Lift Equipment: Stedy  Ambulation/Gait               General Gait Details: unable to safely ambulate today  Stairs            Wheelchair Mobility     Tilt Bed    Modified Rankin (Stroke Patients Only)       Balance Overall balance assessment: Needs assistance Sitting-balance support: Bilateral upper extremity supported, Feet supported Sitting balance-Leahy Scale: Good Sitting balance - Comments: supervision static sitting EOB   Standing balance support: Bilateral upper extremity supported, Reliant on assistive device for balance, During functional activity Standing balance-Leahy Scale: Zero Standing balance comment: Patient standing only with Camie Provost.                             Pertinent Vitals/Pain Pain Assessment Pain Assessment: Faces Faces Pain Scale: Hurts little more Pain Location: bilateral knees, especially with attempts to stand Pain Descriptors / Indicators: Discomfort, Moaning Pain Intervention(s): Limited activity within patient's tolerance, Monitored during session, Repositioned    Home Living Family/patient expects to be discharged to:: Skilled nursing facility                   Additional Comments: Patient lives at [facility] where she recent moved to be closer to her family, resided for a few days total between admissions, prior to that she lived in a different SNF for at least 3 years    Prior Function Prior Level of Function : Needs assist       Physical Assist : Mobility (physical);ADLs (physical) Mobility (physical): Bed mobility;Transfers;Gait;Stairs ADLs (physical): Grooming;Dressing;Bathing;IADLs;Toileting Mobility Comments: pt states as recently as April she used a RW for short distances and WC for longer distances, unable to self propel. Reports fall two days ago in the hospital where her knees gave out while  transferring and a near fall with the same situation yesterday. ADLs Comments: pt states prior to April she needed assistance with IADLs and showering but can go to the bathroom herself.     Extremity/Trunk Assessment   Upper Extremity Assessment Upper Extremity Assessment: Generalized weakness    Lower Extremity Assessment Lower Extremity Assessment: Generalized weakness    Cervical / Trunk Assessment Cervical / Trunk Assessment: Normal  Communication   Communication Communication: No apparent difficulties    Cognition Arousal: Alert Behavior During Therapy: WFL for tasks assessed/performed, Anxious   PT - Cognitive impairments: No apparent impairments                         Following commands: Intact       Cueing Cueing Techniques: Verbal cues, Tactile cues     General Comments General comments (skin integrity, edema, etc.): 6L/min via nasal cannula, lowest SpO2 measured at 83% but recovered to 88-90% with rest and PLB. Patient noted to have bloody nose and scratching at buttocks causing bleeding with overly long finger nails.    Exercises Other Exercises Other Exercises: practiced 2 sit <> stand from buttocks support in Lloyd Steady plus the sit<>  stand from bed to chair with Camie Provost using CGA. Also attempted sit <> Stand at edge of bed wtih RW with min A (unsuccessful). Spent several minutes in more upright partial standing position in Gregory Steady and each sit <> stand she stood as long as tolerated to help improve standing tolerance. Other Exercises: Patient assisted with bedding and gown change as both were soaked with urine. Assisted with donning/doffing socks.   Assessment/Plan    PT Assessment Patient needs continued PT services  PT Problem List Decreased strength;Decreased activity tolerance;Decreased balance;Decreased mobility;Decreased knowledge of use of DME;Obesity;Cardiopulmonary status limiting activity;Decreased range of motion       PT  Treatment Interventions Gait training;Stair training;Functional mobility training;Therapeutic activities;Therapeutic exercise;Balance training;DME instruction;Neuromuscular re-education;Cognitive remediation;Patient/family education;Wheelchair mobility training    PT Goals (Current goals can be found in the Care Plan section)  Acute Rehab PT Goals Patient Stated Goal: get better PT Goal Formulation: With patient Time For Goal Achievement: 08/28/24 Potential to Achieve Goals: Good    Frequency Min 2X/week     Co-evaluation PT/OT/SLP Co-Evaluation/Treatment: Yes Reason for Co-Treatment: Complexity of the patient's impairments (multi-system involvement);For patient/therapist safety;To address functional/ADL transfers PT goals addressed during session: Mobility/safety with mobility;Strengthening/ROM         AM-PAC PT 6 Clicks Mobility  Outcome Measure Help needed turning from your back to your side while in a flat bed without using bedrails?: A Lot Help needed moving from lying on your back to sitting on the side of a flat bed without using bedrails?: A Lot Help needed moving to and from a bed to a chair (including a wheelchair)?: A Lot Help needed standing up from a chair using your arms (e.g., wheelchair or bedside chair)?: A Lot Help needed to walk in hospital room?: Total Help needed climbing 3-5 steps with a railing? : Total 6 Click Score: 10    End of Session Equipment Utilized During Treatment: Oxygen;Gait belt;Other (comment) Jayson Steady) Activity Tolerance: Patient limited by fatigue Patient left: in chair;with call bell/phone within reach;with chair alarm set Nurse Communication: Mobility status;Need for lift equipment (nose bleed, scratching with bleeding at buttocks) PT Visit Diagnosis: Repeated falls (R29.6);Muscle weakness (generalized) (M62.81);History of falling (Z91.81);Unsteadiness on feet (R26.81);Difficulty in walking, not elsewhere classified (R26.2);Other  abnormalities of gait and mobility (R26.89)    Time: 8868-8794 PT Time Calculation (min) (ACUTE ONLY): 34 min   Charges:   PT Evaluation $PT Re-evaluation: 1 Re-eval PT Treatments $Therapeutic Activity: 8-22 mins PT General Charges $$ ACUTE PT VISIT: 1 Visit         Camie SAUNDERS. Juli, PT, DPT, Cert. MDT 06/05/24, 1:08 PM  [facility] [address] [place], [place] [address] P: [phone] I F: [phone]

## 2024-06-06 DIAGNOSIS — J9601 Acute respiratory failure with hypoxia: Secondary | ICD-10-CM | POA: Diagnosis not present

## 2024-06-06 LAB — CBC
HCT: 25.5 % — ABNORMAL LOW (ref 36.0–46.0)
Hemoglobin: 7.6 g/dL — ABNORMAL LOW (ref 12.0–15.0)
MCH: 30.2 pg (ref 26.0–34.0)
MCHC: 29.8 g/dL — ABNORMAL LOW (ref 30.0–36.0)
MCV: 101.2 fL — ABNORMAL HIGH (ref 80.0–100.0)
Platelets: 175 10*3/uL (ref 150–400)
RBC: 2.52 MIL/uL — ABNORMAL LOW (ref 3.87–5.11)
RDW: 16.5 % — ABNORMAL HIGH (ref 11.5–15.5)
WBC: 7.4 10*3/uL (ref 4.0–10.5)
nRBC: 0.3 % — ABNORMAL HIGH (ref 0.0–0.2)

## 2024-06-06 LAB — BASIC METABOLIC PANEL WITH GFR
Anion gap: 8 (ref 5–15)
BUN: 18 mg/dL (ref 8–23)
CO2: 31 mmol/L (ref 22–32)
Calcium: 8.4 mg/dL — ABNORMAL LOW (ref 8.9–10.3)
Chloride: 96 mmol/L — ABNORMAL LOW (ref 98–111)
Creatinine, Ser: 1.07 mg/dL — ABNORMAL HIGH (ref 0.44–1.00)
GFR, Estimated: 57 mL/min — ABNORMAL LOW (ref >60)
Glucose, Bld: 85 mg/dL (ref 70–99)
Potassium: 4.3 mmol/L (ref 3.5–5.1)
Sodium: 135 mmol/L (ref 135–145)

## 2024-06-06 LAB — GLUCOSE, CAPILLARY
Glucose-Capillary: 85 mg/dL (ref 70–99)
Glucose-Capillary: 118 mg/dL — ABNORMAL HIGH (ref 70–99)
Glucose-Capillary: 180 mg/dL — ABNORMAL HIGH (ref 70–99)
Glucose-Capillary: 138 mg/dL — ABNORMAL HIGH (ref 70–99)

## 2024-06-06 NOTE — Plan of Care (Signed)
  Problem: Education: Goal: Ability to describe self-care measures that may prevent or decrease complications (Diabetes Survival Skills Education) will improve Outcome: Progressing   Problem: Coping: Goal: Ability to adjust to condition or change in health will improve Outcome: Progressing   Problem: Fluid Volume: Goal: Ability to maintain a balanced intake and output will improve Outcome: Progressing   Problem: Metabolic: Goal: Ability to maintain appropriate glucose levels will improve Outcome: Progressing   Problem: Nutritional: Goal: Maintenance of adequate nutrition will improve Outcome: Progressing   Problem: Skin Integrity: Goal: Risk for impaired skin integrity will decrease Outcome: Progressing   Problem: Tissue Perfusion: Goal: Adequacy of tissue perfusion will improve Outcome: Progressing   Problem: Clinical Measurements: Goal: Will remain free from infection Outcome: Progressing   Problem: Clinical Measurements: Goal: Diagnostic test results will improve Outcome: Progressing   Problem: Clinical Measurements: Goal: Respiratory complications will improve Outcome: Progressing   Problem: Clinical Measurements: Goal: Cardiovascular complication will be avoided Outcome: Progressing   Problem: Coping: Goal: Level of anxiety will decrease Outcome: Progressing   Problem: Elimination: Goal: Will not experience complications related to bowel motility Outcome: Progressing   Problem: Safety: Goal: Ability to remain free from injury will improve Outcome: Progressing  Plan of care, assessment, treatment, monitoring, and intervention (s)  ongoing, see MAR see flowsheet

## 2024-06-06 NOTE — Plan of Care (Signed)
   Problem: Education: Goal: Ability to describe self-care measures that may prevent or decrease complications (Diabetes Survival Skills Education) will improve Outcome: Progressing   Problem: Coping: Goal: Ability to adjust to condition or change in health will improve Outcome: Progressing   Problem: Fluid Volume: Goal: Ability to maintain a balanced intake and output will improve Outcome: Progressing

## 2024-06-06 NOTE — Plan of Care (Signed)

## 2024-06-06 NOTE — Progress Notes (Signed)
 PROGRESS NOTE    [name]  FMW:[id number] DOB: September 03, 1956 DOA: 05/30/2024 PCP: [name] DASEN, PA-C     Brief Narrative:   [name] is a 68 y.o. female with medical history significant of COPD, rheumatoid arthritis, obesity, CHF, pulmonary embolism, type 2 diabetes, DVT, hypertension, hyperlipidemia, hypothyroidism, chronic respiratory failure on 6 L [place] and nighttime BiPAP, nicotine  dependence, atrial fibrillation status post cardioversion May 2025, currently on amiodarone  and Xarelto .  Patient was recently hospitalized 5/5 through 5/16 at [facility] for multifactorial respiratory failure related to CHF and pneumonia.  She was admitted to [facility] on 6/6 through 6/9 due to respiratory failure and epistaxis.  Patient reports that her home BiPAP was broken causing inadequate oxygen at home.  Admission was also complicated by epistaxis secondary to nasal cannula and DOAC. On this admission patient presents obtunded and hypoxic into the 50s.  Reportedly there was issue with patient's breathing device at her living facility and she was placed on oxygen tank which apparently ran out for a prolonged period of time without staff knowledge.  On arrival to the ED patient was hypoxic in the 50%.  Patient required bagging but was alert and responsive.  Patient was placed on BiPAP.  And had improvement. Initial pCO2 81, chest x-ray with interstitial prominence bilaterally and bilateral pleural effusions.  CMP revealed bump in creatinine, hyponatremia, mildly elevated troponin.  Patient was also found to be hypotensive but fluid responsive after 500 cc NS.  We were asked to admit the patient for further workup.  Assessment & Plan:   # Acute on chronic hypoxic respiratory failure # OHS # OSA Recent admit for respiratory distress 2/2 malfunctioning bipap machine, that's apparently fixed but now doesn't have mask that fits her so arrived her hypoxic and in respiratory distress, this is resolved with use  of bipap here - continue bipap here when sleeping - TOC consulted working with SNF to resolve this mask issue, as of Friday SNF still waiting for the replacement part  # Anemia of chronic disease Labs suggestive of chronic disease, recent large volume epistaxis plus daily lab draws likely contributing, hgb stable in the 7s. No melena or other bleeding (nurse saw stool yesterday and was brown) - monitor intermittently for now  # COPD No exacerbation - home trelegy  # A-fib Rate controlled but BPs soft - home dilt, amio, xarelto , have reduced dose of dilt from 360 to 240  # Hypothyroid - home synthroid   # AKI Resolved - monitor kidney function intermittently  # HFpEF Appears compensated, bnp wnl - home lasix   # Neuropathy, chronic pain Stable - home gabapentin , tramadol   # RA Stable - home plaquenil , arava , prednisone   # T2DM Controlled - SSI     DVT prophylaxis: xarelto  Code Status: full Family Communication: daughter Delon updated telephonically 6/21  Level of care: Telemetry Cardiac Status is: inpt    Consultants:  none  Procedures: none  Antimicrobials:  none    Subjective: Reports breathing is at baseline. Tolerating diet. Regular BMs. Worked with PT yesterday  Objective: Vitals:   06/06/24 0433 06/06/24 0720 06/06/24 0755 06/06/24 1153  BP: 131/65  (!) 119/52 (!) 108/57  Pulse: 67  77 81  Resp: 20  18 17   Temp: 98 F (36.7 C)  98.6 F (37 C) 98.6 F (37 C)  TempSrc:      SpO2: 95% 93% 95% 99%  Weight:      Height:        Intake/Output  Summary (Last 24 hours) at 06/06/2024 1300 Last data filed at 06/06/2024 0900 Gross per 24 hour  Intake 820 ml  Output 1000 ml  Net -180 ml   Filed Weights   05/30/24 1434  Weight: 109.2 kg    Examination:  General exam: Appears calm and comfortable, chronically ill Respiratory system: Clear to auscultation. Respiratory effort normal. Cardiovascular system: S1 & S2 heard, irreg,  distant Gastrointestinal system: Abdomen is obese, soft and nontender.   Central nervous system: Alert and oriented. No focal neurological deficits. Extremities: Symmetric 5 x 5 power. Skin: venous stasis changes in LEs Psychiatry: Judgement and insight appear normal. Mood & affect appropriate.     Data Reviewed: I have personally reviewed following labs and imaging studies  CBC: Recent Labs  Lab 05/30/24 1425 05/31/24 0421 06/01/24 0436 06/02/24 0341 06/06/24 0534  WBC 7.9 6.0 5.1 4.2 7.4  NEUTROABS 6.1 5.2 3.8 2.8  --   HGB 7.9* 7.6* 7.2* 7.8* 7.6*  HCT 25.6* 25.0* 23.2* 25.3* 25.5*  MCV 99.6 97.3 97.5 99.6 101.2*  PLT 224 245 234 226 175   Basic Metabolic Panel: Recent Labs  Lab 05/30/24 1425 05/31/24 0421 06/01/24 0436 06/02/24 0341 06/06/24 0534  NA 133* 136 137 140 135  K 3.9 4.3 3.7 3.8 4.3  CL 90* 93* 94* 97* 96*  CO2 30 31 33* 34* 31  GLUCOSE 144* 135* 101* 77 85  BUN 31* 35* 40* 29* 18  CREATININE 1.22* 1.37* 1.30* 1.00 1.07*  CALCIUM  9.3 9.1 8.8* 8.9 8.4*  MG  --  2.1 2.2 2.2  --   PHOS  --  5.0* 2.8 2.5  --    GFR: Estimated Creatinine Clearance: 61.3 mL/min (A) (by C-G formula based on SCr of 1.07 mg/dL (H)). Liver Function Tests: Recent Labs  Lab 05/30/24 1425 05/31/24 0421 06/01/24 0436 06/02/24 0341  AST 19 17 13* 12*  ALT 19 19 16 15   ALKPHOS 59 57 50 52  BILITOT 0.8 0.6 0.7 0.6  PROT 6.4* 6.4* 5.8* 5.6*  ALBUMIN 3.1* 3.1* 2.9* 2.9*   No results for input(s): LIPASE, AMYLASE in the last 168 hours. No results for input(s): AMMONIA in the last 168 hours. Coagulation Profile: No results for input(s): INR, PROTIME in the last 168 hours.  Cardiac Enzymes: No results for input(s): CKTOTAL, CKMB, CKMBINDEX, TROPONINI in the last 168 hours. BNP (last 3 results) No results for input(s): PROBNP in the last 8760 hours. HbA1C: No results for input(s): HGBA1C in the last 72 hours.  CBG: Recent Labs  Lab  06/05/24 1219 06/05/24 1757 06/05/24 2037 06/06/24 0755 06/06/24 1152  GLUCAP 110* 126* 148* 85 118*   Lipid Profile: No results for input(s): CHOL, HDL, LDLCALC, TRIG, CHOLHDL, LDLDIRECT in the last 72 hours. Thyroid  Function Tests: No results for input(s): TSH, T4TOTAL, FREET4, T3FREE, THYROIDAB in the last 72 hours. Anemia Panel: No results for input(s): VITAMINB12, FOLATE, FERRITIN, TIBC, IRON, RETICCTPCT in the last 72 hours. Urine analysis:    Component Value Date/Time   COLORURINE YELLOW 05/21/2021 2128   APPEARANCEUR CLEAR 05/21/2021 2128   LABSPEC 1.010 05/21/2021 2128   PHURINE 7.0 05/21/2021 2128   GLUCOSEU NEGATIVE 05/21/2021 2128   HGBUR NEGATIVE 05/21/2021 2128   HGBUR small 07/21/2008 1538   BILIRUBINUR NEGATIVE 05/21/2021 2128   KETONESUR NEGATIVE 05/21/2021 2128   PROTEINUR NEGATIVE 05/21/2021 2128   UROBILINOGEN 0.2 04/23/2015 1851   NITRITE NEGATIVE 05/21/2021 2128   LEUKOCYTESUR TRACE (A) 05/21/2021 2128   Sepsis Labs: @  LABRCNTIP(procalcitonin:4,lacticidven:4)  ) Recent Results (from the past 240 hours)  Resp panel by RT-PCR (RSV, Flu A&B, Covid) Anterior Nasal Swab     Status: None   Collection Time: 05/30/24  2:25 PM   Specimen: Anterior Nasal Swab  Result Value Ref Range Status   SARS Coronavirus 2 by RT PCR NEGATIVE NEGATIVE Final    Comment: (NOTE) SARS-CoV-2 target nucleic acids are NOT DETECTED.  The SARS-CoV-2 RNA is generally detectable in upper respiratory specimens during the acute phase of infection. The lowest concentration of SARS-CoV-2 viral copies this assay can detect is 138 copies/mL. A negative result does not preclude SARS-Cov-2 infection and should not be used as the sole basis for treatment or other patient management decisions. A negative result may occur with  improper specimen collection/handling, submission of specimen other than nasopharyngeal swab, presence of viral mutation(s) within  the areas targeted by this assay, and inadequate number of viral copies(<138 copies/mL). A negative result must be combined with clinical observations, patient history, and epidemiological information. The expected result is Negative.  Fact Sheet for Patients:  BloggerCourse.com  Fact Sheet for Healthcare Providers:  SeriousBroker.it  This test is no t yet approved or cleared by the [place]  FDA and  has been authorized for detection and/or diagnosis of SARS-CoV-2 by FDA under an Emergency Use Authorization (EUA). This EUA will remain  in effect (meaning this test can be used) for the duration of the COVID-19 declaration under Section 564(b)(1) of the Act, 21 U.S.C.section 360bbb-3(b)(1), unless the authorization is terminated  or revoked sooner.       Influenza A by PCR NEGATIVE NEGATIVE Final   Influenza B by PCR NEGATIVE NEGATIVE Final    Comment: (NOTE) The Xpert Xpress SARS-CoV-2/FLU/RSV plus assay is intended as an aid in the diagnosis of influenza from Nasopharyngeal swab specimens and should not be used as a sole basis for treatment. Nasal washings and aspirates are unacceptable for Xpert Xpress SARS-CoV-2/FLU/RSV testing.  Fact Sheet for Patients: BloggerCourse.com  Fact Sheet for Healthcare Providers: SeriousBroker.it  This test is not yet approved or cleared by the [place]  FDA and has been authorized for detection and/or diagnosis of SARS-CoV-2 by FDA under an Emergency Use Authorization (EUA). This EUA will remain in effect (meaning this test can be used) for the duration of the COVID-19 declaration under Section 564(b)(1) of the Act, 21 U.S.C. section 360bbb-3(b)(1), unless the authorization is terminated or revoked.     Resp Syncytial Virus by PCR NEGATIVE NEGATIVE Final    Comment: (NOTE) Fact Sheet for  Patients: BloggerCourse.com  Fact Sheet for Healthcare Providers: SeriousBroker.it  This test is not yet approved or cleared by the [place]  FDA and has been authorized for detection and/or diagnosis of SARS-CoV-2 by FDA under an Emergency Use Authorization (EUA). This EUA will remain in effect (meaning this test can be used) for the duration of the COVID-19 declaration under Section 564(b)(1) of the Act, 21 U.S.C. section 360bbb-3(b)(1), unless the authorization is terminated or revoked.  Performed at [facility], [address] [place], [place] [address]   Blood culture (routine x 2)     Status: None   Collection Time: 05/30/24  2:25 PM   Specimen: BLOOD RIGHT ARM  Result Value Ref Range Status   Specimen Description BLOOD RIGHT ARM  Final   Special Requests   Final    BOTTLES DRAWN AEROBIC AND ANAEROBIC Blood Culture results may not be optimal due to an inadequate volume of blood  received in culture bottles   Culture   Final    NO GROWTH 5 DAYS Performed at [facility], [address] [place], [place] [address]    Report Status 06/04/2024 FINAL  Final  Blood culture (routine x 2)     Status: None   Collection Time: 05/30/24  3:01 PM   Specimen: BLOOD RIGHT HAND  Result Value Ref Range Status   Specimen Description BLOOD RIGHT HAND  Final   Special Requests   Final    BOTTLES DRAWN AEROBIC AND ANAEROBIC Blood Culture results may not be optimal due to an inadequate volume of blood received in culture bottles   Culture   Final    NO GROWTH 5 DAYS Performed at [facility], [address], [place], [place] [address]    Report Status 06/04/2024 FINAL  Final  MRSA Next Gen by PCR, Nasal     Status: None   Collection Time: 05/31/24  5:09 AM   Specimen: Nasal Mucosa; Nasal Swab  Result Value Ref Range Status   MRSA by PCR Next Gen NOT DETECTED NOT DETECTED Final    Comment: (NOTE) The  GeneXpert MRSA Assay (FDA approved for NASAL specimens only), is one component of a comprehensive MRSA colonization surveillance program. It is not intended to diagnose MRSA infection nor to guide or monitor treatment for MRSA infections. Test performance is not FDA approved in patients less than 50 years old. Performed at [facility], [address], [place], [place] [address]          Radiology Studies: No results found.        Scheduled Meds:  amiodarone   200 mg Oral Daily   atorvastatin   10 mg Oral QHS   budesonide   0.5 mg Nebulization BID   diltiazem   240 mg Oral Daily   ferrous sulfate   325 mg Oral Q lunch   gabapentin   400 mg Oral TID   hydroxychloroquine   200 mg Oral Daily   insulin  aspart  0-6 Units Subcutaneous TID WC   levothyroxine   150 mcg Oral QAC breakfast   melatonin  5 mg Oral QHS   montelukast   10 mg Oral Daily   multivitamin with minerals  1 tablet Oral Daily   pantoprazole   40 mg Oral Daily   polyethylene glycol  17 g Oral BID   predniSONE   5 mg Oral Q breakfast   rivaroxaban   20 mg Oral q AM   senna-docusate  2 tablet Oral BID   sertraline   75 mg Oral Daily   Continuous Infusions:   LOS: 6 days     Devaughn KATHEE Ban, MD [facility]   If 7PM-7AM, please contact night-coverage [email or web address] Password [email or web address] 06/06/2024, 1:00 PM

## 2024-06-07 DIAGNOSIS — J9601 Acute respiratory failure with hypoxia: Secondary | ICD-10-CM | POA: Diagnosis not present

## 2024-06-07 LAB — GLUCOSE, CAPILLARY
Glucose-Capillary: 83 mg/dL (ref 70–99)
Glucose-Capillary: 119 mg/dL — ABNORMAL HIGH (ref 70–99)
Glucose-Capillary: 169 mg/dL — ABNORMAL HIGH (ref 70–99)
Glucose-Capillary: 139 mg/dL — ABNORMAL HIGH (ref 70–99)

## 2024-06-07 MED ORDER — OXYMETAZOLINE HCL 0.05 % NA SOLN
1.0000 | Freq: Two times a day (BID) | NASAL | Status: DC | PRN
  Administered 2024-06-07: 1 via NASAL
  Filled 2024-06-07: qty 15

## 2024-06-07 NOTE — Plan of Care (Signed)
  Problem: Nutrition: Goal: Adequate nutrition will be maintained Outcome: Progressing   Problem: Safety: Goal: Ability to remain free from injury will improve Outcome: Progressing   

## 2024-06-07 NOTE — TOC Progression Note (Signed)
 Transition of Care [facility]) - Progression Note    Patient Details  Name: [name] MRN: [id number] Date of Birth: [birth year]  Transition of Care [facility]) CM/SW Contact  Tomasa JAYSON Childes, RN Phone Number: 06/07/2024, 3:00 PM  Clinical Narrative:    Spoke with Tammy from [facility]. Script for BIPAP is needed for facility to order BIPAP. Tammy stated patient a hard script is needed. MD notified. Tammy anticipates order will take approx 1 day for facility to receive.           Expected Discharge Plan and Services                                               Social Determinants of Health (SDOH) Interventions SDOH Screenings   Food Insecurity: No Food Insecurity (05/24/2024)  Housing: Low Risk  (05/24/2024)  Transportation Needs: No Transportation Needs (05/24/2024)  Utilities: Not At Risk (05/21/2024)  Financial Resource Strain: High Risk (05/24/2024)  Social Connections: Socially Isolated (05/21/2024)  Tobacco Use: Medium Risk (05/30/2024)    Readmission Risk Interventions     No data to display

## 2024-06-07 NOTE — Progress Notes (Signed)
 PROGRESS NOTE    [name]  FMW:[id number] DOB: 06-04-1956 DOA: 05/30/2024 PCP: [name] DASEN, PA-C     Brief Narrative:   [name] is a 68 y.o. female with medical history significant of COPD, rheumatoid arthritis, obesity, CHF, pulmonary embolism, type 2 diabetes, DVT, hypertension, hyperlipidemia, hypothyroidism, chronic respiratory failure on 6 L [place] and nighttime BiPAP, nicotine  dependence, atrial fibrillation status post cardioversion May 2025, currently on amiodarone  and Xarelto .  Patient was recently hospitalized 5/5 through 5/16 at [facility] for multifactorial respiratory failure related to CHF and pneumonia.  She was admitted to [facility] on 6/6 through 6/9 due to respiratory failure and epistaxis.  Patient reports that her home BiPAP was broken causing inadequate oxygen at home.  Admission was also complicated by epistaxis secondary to nasal cannula and DOAC. On this admission patient presents obtunded and hypoxic into the 50s.  Reportedly there was issue with patient's breathing device at her living facility and she was placed on oxygen tank which apparently ran out for a prolonged period of time without staff knowledge.  On arrival to the ED patient was hypoxic in the 50%.  Patient required bagging but was alert and responsive.  Patient was placed on BiPAP.  And had improvement. Initial pCO2 81, chest x-ray with interstitial prominence bilaterally and bilateral pleural effusions.  CMP revealed bump in creatinine, hyponatremia, mildly elevated troponin.  Patient was also found to be hypotensive but fluid responsive after 500 cc NS.  We were asked to admit the patient for further workup.  Assessment & Plan:   # Acute on chronic hypoxic respiratory failure # OHS # OSA Recent admit for respiratory distress 2/2 malfunctioning bipap machine, that's apparently fixed but now doesn't have mask that fits her so arrived her hypoxic and in respiratory distress, this is resolved with use  of bipap here - continue bipap here when sleeping - TOC consulted working with SNF to resolve this mask issue. Been a week now....  # Anemia of chronic disease Labs suggestive of chronic disease, recent large volume epistaxis plus daily lab draws likely contributing, hgb stable in the 7s. No melena or other bleeding, stool is brown. - monitor intermittently for now  # COPD No exacerbation - home trelegy  # A-fib Rate controlled but BPs soft - home dilt, amio, xarelto , have reduced dose of dilt from 360 to 240  # Hypothyroid - home synthroid   # AKI Resolved - monitor kidney function intermittently  # HFpEF Appears compensated, bnp wnl - home lasix   # Neuropathy, chronic pain Stable - home gabapentin , tramadol   # RA Stable - home plaquenil , arava , prednisone   # T2DM Controlled - SSI     DVT prophylaxis: xarelto  Code Status: full Family Communication: daughter Delon updated telephonically 6/23  Level of care: Telemetry Cardiac Status is: inpt    Consultants:  none  Procedures: none  Antimicrobials:  none    Subjective: Reports breathing is at baseline. Tolerating diet. BM today.   Objective: Vitals:   06/07/24 0044 06/07/24 0357 06/07/24 0750 06/07/24 1125  BP: (!) 112/53 (!) 109/51 (!) 119/57 112/61  Pulse: 70 76 76 68  Resp: 18 18 20 19   Temp: 98.3 F (36.8 C) 98.2 F (36.8 C) 98.8 F (37.1 C) 97.9 F (36.6 C)  TempSrc:      SpO2: 90% 94% 92% 96%  Weight:      Height:        Intake/Output Summary (Last 24 hours) at 06/07/2024 1451 Last data  filed at 06/06/2024 2000 Gross per 24 hour  Intake 360 ml  Output 1150 ml  Net -790 ml   Filed Weights   05/30/24 1434  Weight: 109.2 kg    Examination:  General exam: Appears calm and comfortable, chronically ill Respiratory system: Clear to auscultation. Respiratory effort normal. Cardiovascular system: S1 & S2 heard, irreg, distant Gastrointestinal system: Abdomen is obese, soft  and nontender.   Central nervous system: Alert and oriented. No focal neurological deficits. Extremities: Symmetric 5 x 5 power. Skin: venous stasis changes in LEs Psychiatry: Judgement and insight appear normal. Mood & affect appropriate.     Data Reviewed: I have personally reviewed following labs and imaging studies  CBC: Recent Labs  Lab 06/01/24 0436 06/02/24 0341 06/06/24 0534  WBC 5.1 4.2 7.4  NEUTROABS 3.8 2.8  --   HGB 7.2* 7.8* 7.6*  HCT 23.2* 25.3* 25.5*  MCV 97.5 99.6 101.2*  PLT 234 226 175   Basic Metabolic Panel: Recent Labs  Lab 06/01/24 0436 06/02/24 0341 06/06/24 0534  NA 137 140 135  K 3.7 3.8 4.3  CL 94* 97* 96*  CO2 33* 34* 31  GLUCOSE 101* 77 85  BUN 40* 29* 18  CREATININE 1.30* 1.00 1.07*  CALCIUM  8.8* 8.9 8.4*  MG 2.2 2.2  --   PHOS 2.8 2.5  --    GFR: Estimated Creatinine Clearance: 61.3 mL/min (A) (by C-G formula based on SCr of 1.07 mg/dL (H)). Liver Function Tests: Recent Labs  Lab 06/01/24 0436 06/02/24 0341  AST 13* 12*  ALT 16 15  ALKPHOS 50 52  BILITOT 0.7 0.6  PROT 5.8* 5.6*  ALBUMIN 2.9* 2.9*   No results for input(s): LIPASE, AMYLASE in the last 168 hours. No results for input(s): AMMONIA in the last 168 hours. Coagulation Profile: No results for input(s): INR, PROTIME in the last 168 hours.  Cardiac Enzymes: No results for input(s): CKTOTAL, CKMB, CKMBINDEX, TROPONINI in the last 168 hours. BNP (last 3 results) No results for input(s): PROBNP in the last 8760 hours. HbA1C: No results for input(s): HGBA1C in the last 72 hours.  CBG: Recent Labs  Lab 06/06/24 1152 06/06/24 1648 06/06/24 2122 06/07/24 0749 06/07/24 1122  GLUCAP 118* 180* 138* 83 119*   Lipid Profile: No results for input(s): CHOL, HDL, LDLCALC, TRIG, CHOLHDL, LDLDIRECT in the last 72 hours. Thyroid  Function Tests: No results for input(s): TSH, T4TOTAL, FREET4, T3FREE, THYROIDAB in the last 72  hours. Anemia Panel: No results for input(s): VITAMINB12, FOLATE, FERRITIN, TIBC, IRON, RETICCTPCT in the last 72 hours. Urine analysis:    Component Value Date/Time   COLORURINE YELLOW 05/21/2021 2128   APPEARANCEUR CLEAR 05/21/2021 2128   LABSPEC 1.010 05/21/2021 2128   PHURINE 7.0 05/21/2021 2128   GLUCOSEU NEGATIVE 05/21/2021 2128   HGBUR NEGATIVE 05/21/2021 2128   HGBUR small 07/21/2008 1538   BILIRUBINUR NEGATIVE 05/21/2021 2128   KETONESUR NEGATIVE 05/21/2021 2128   PROTEINUR NEGATIVE 05/21/2021 2128   UROBILINOGEN 0.2 04/23/2015 1851   NITRITE NEGATIVE 05/21/2021 2128   LEUKOCYTESUR TRACE (A) 05/21/2021 2128   Sepsis Labs: @LABRCNTIP (procalcitonin:4,lacticidven:4)  ) Recent Results (from the past 240 hours)  Resp panel by RT-PCR (RSV, Flu A&B, Covid) Anterior Nasal Swab     Status: None   Collection Time: 05/30/24  2:25 PM   Specimen: Anterior Nasal Swab  Result Value Ref Range Status   SARS Coronavirus 2 by RT PCR NEGATIVE NEGATIVE Final    Comment: (NOTE) SARS-CoV-2 target nucleic acids are  NOT DETECTED.  The SARS-CoV-2 RNA is generally detectable in upper respiratory specimens during the acute phase of infection. The lowest concentration of SARS-CoV-2 viral copies this assay can detect is 138 copies/mL. A negative result does not preclude SARS-Cov-2 infection and should not be used as the sole basis for treatment or other patient management decisions. A negative result may occur with  improper specimen collection/handling, submission of specimen other than nasopharyngeal swab, presence of viral mutation(s) within the areas targeted by this assay, and inadequate number of viral copies(<138 copies/mL). A negative result must be combined with clinical observations, patient history, and epidemiological information. The expected result is Negative.  Fact Sheet for Patients:  BloggerCourse.com  Fact Sheet for Healthcare  Providers:  SeriousBroker.it  This test is no t yet approved or cleared by the [place]  FDA and  has been authorized for detection and/or diagnosis of SARS-CoV-2 by FDA under an Emergency Use Authorization (EUA). This EUA will remain  in effect (meaning this test can be used) for the duration of the COVID-19 declaration under Section 564(b)(1) of the Act, 21 U.S.C.section 360bbb-3(b)(1), unless the authorization is terminated  or revoked sooner.       Influenza A by PCR NEGATIVE NEGATIVE Final   Influenza B by PCR NEGATIVE NEGATIVE Final    Comment: (NOTE) The Xpert Xpress SARS-CoV-2/FLU/RSV plus assay is intended as an aid in the diagnosis of influenza from Nasopharyngeal swab specimens and should not be used as a sole basis for treatment. Nasal washings and aspirates are unacceptable for Xpert Xpress SARS-CoV-2/FLU/RSV testing.  Fact Sheet for Patients: BloggerCourse.com  Fact Sheet for Healthcare Providers: SeriousBroker.it  This test is not yet approved or cleared by the [place]  FDA and has been authorized for detection and/or diagnosis of SARS-CoV-2 by FDA under an Emergency Use Authorization (EUA). This EUA will remain in effect (meaning this test can be used) for the duration of the COVID-19 declaration under Section 564(b)(1) of the Act, 21 U.S.C. section 360bbb-3(b)(1), unless the authorization is terminated or revoked.     Resp Syncytial Virus by PCR NEGATIVE NEGATIVE Final    Comment: (NOTE) Fact Sheet for Patients: BloggerCourse.com  Fact Sheet for Healthcare Providers: SeriousBroker.it  This test is not yet approved or cleared by the [place]  FDA and has been authorized for detection and/or diagnosis of SARS-CoV-2 by FDA under an Emergency Use Authorization (EUA). This EUA will remain in effect (meaning this test can be  used) for the duration of the COVID-19 declaration under Section 564(b)(1) of the Act, 21 U.S.C. section 360bbb-3(b)(1), unless the authorization is terminated or revoked.  Performed at [facility], [address] [place], [place] [address]   Blood culture (routine x 2)     Status: None   Collection Time: 05/30/24  2:25 PM   Specimen: BLOOD RIGHT ARM  Result Value Ref Range Status   Specimen Description BLOOD RIGHT ARM  Final   Special Requests   Final    BOTTLES DRAWN AEROBIC AND ANAEROBIC Blood Culture results may not be optimal due to an inadequate volume of blood received in culture bottles   Culture   Final    NO GROWTH 5 DAYS Performed at [facility], [address], [place], [place] [address]    Report Status 06/04/2024 FINAL  Final  Blood culture (routine x 2)     Status: None   Collection Time: 05/30/24  3:01 PM   Specimen: BLOOD RIGHT HAND  Result Value Ref Range  Status   Specimen Description BLOOD RIGHT HAND  Final   Special Requests   Final    BOTTLES DRAWN AEROBIC AND ANAEROBIC Blood Culture results may not be optimal due to an inadequate volume of blood received in culture bottles   Culture   Final    NO GROWTH 5 DAYS Performed at [facility], [address] [place], [place] [address]    Report Status 06/04/2024 FINAL  Final  MRSA Next Gen by PCR, Nasal     Status: None   Collection Time: 05/31/24  5:09 AM   Specimen: Nasal Mucosa; Nasal Swab  Result Value Ref Range Status   MRSA by PCR Next Gen NOT DETECTED NOT DETECTED Final    Comment: (NOTE) The GeneXpert MRSA Assay (FDA approved for NASAL specimens only), is one component of a comprehensive MRSA colonization surveillance program. It is not intended to diagnose MRSA infection nor to guide or monitor treatment for MRSA infections. Test performance is not FDA approved in patients less than 46 years old. Performed at [facility], [address],  [place], [place] [address]          Radiology Studies: No results found.        Scheduled Meds:  amiodarone   200 mg Oral Daily   atorvastatin   10 mg Oral QHS   budesonide   0.5 mg Nebulization BID   diltiazem   240 mg Oral Daily   ferrous sulfate   325 mg Oral Q lunch   gabapentin   400 mg Oral TID   hydroxychloroquine   200 mg Oral Daily   insulin  aspart  0-6 Units Subcutaneous TID WC   levothyroxine   150 mcg Oral QAC breakfast   melatonin  5 mg Oral QHS   montelukast   10 mg Oral Daily   multivitamin with minerals  1 tablet Oral Daily   pantoprazole   40 mg Oral Daily   polyethylene glycol  17 g Oral BID   predniSONE   5 mg Oral Q breakfast   rivaroxaban   20 mg Oral q AM   senna-docusate  2 tablet Oral BID   sertraline   75 mg Oral Daily   Continuous Infusions:   LOS: 7 days     Devaughn KATHEE Ban, MD [facility]   If 7PM-7AM, please contact night-coverage [email or web address] Password [facility] 06/07/2024, 2:51 PM

## 2024-06-08 ENCOUNTER — Encounter: Admitting: Cardiology

## 2024-06-08 DIAGNOSIS — J9601 Acute respiratory failure with hypoxia: Secondary | ICD-10-CM | POA: Diagnosis not present

## 2024-06-08 LAB — GLUCOSE, CAPILLARY
Glucose-Capillary: 83 mg/dL (ref 70–99)
Glucose-Capillary: 90 mg/dL (ref 70–99)
Glucose-Capillary: 93 mg/dL (ref 70–99)

## 2024-06-08 MED ORDER — DILTIAZEM HCL ER COATED BEADS 240 MG PO CP24: 240.0000 mg | ORAL_CAPSULE | Freq: Every day | ORAL | Status: DC

## 2024-06-08 NOTE — Progress Notes (Signed)
 Report called to nurse [facility] at [facility]. Patient will be going to room 103 A.

## 2024-06-08 NOTE — Plan of Care (Signed)
  Problem: Education: Goal: Ability to describe self-care measures that may prevent or decrease complications (Diabetes Survival Skills Education) will improve 06/08/2024 0438 by Henry Dess, RN Outcome: Progressing  Problem: Coping: Goal: Ability to adjust to condition or change in health will improve 06/08/2024 0438 by Henry Dess, RN Outcome: Progressing Problem: Fluid Volume: Goal: Ability to maintain a balanced intake and output will improve 06/08/2024 0438 by Henry Dess, RN Outcome: Progressing Problem: Metabolic:  Goal: Ability to maintain appropriate glucose levels will improve 06/08/2024 0438 by Henry Dess, RN Outcome: Progressing  Problem: Nutritional: Goal: Maintenance of adequate nutrition will improve 06/08/2024 0438 by Henry Dess, RN Outcome: Progressing Problem: Skin Integrity:  Goal: Risk for impaired skin integrity will decrease 06/08/2024 0438 by Henry Dess, RN Outcome: Progressing Problem: Tissue Perfusion:  Goal: Adequacy of tissue perfusion will improve 06/08/2024 0438 by Henry Dess, RN Outcome: Progressing  Problem: Clinical Measurements: Goal: Ability to maintain clinical measurements within normal limits will improve 06/08/2024 0438 by Henry Dess, RN Outcome: Progressing   Problem: Clinical Measurements: Goal: Will remain free from infection 06/08/2024 0438 by Henry Dess, RN Outcome: Progressing   Problem: Clinical Measurements: Goal: Diagnostic test results will improve 06/08/2024 0438 by Henry Dess, RN Outcome: Progressing   Problem: Safety: Goal: Ability to remain free from injury will improve Outcome: Progressing   Plan of care, assessment, treatment, monitoring, and intervention (s) ongoing, see MAR see flowsheet

## 2024-06-08 NOTE — Progress Notes (Signed)
 Physical Therapy Treatment Patient Details Name: [name] MRN: [id number] DOB: June 26, 1956 Today's Date: 06/08/2024   History of Present Illness Pt is a 68 y.o. female presents obtunded and hypoxic d/t being off 02 without staff knowledge at facility. Admitted for management of Acute on chronic hypoxic and hypercapnic respiratory failure, hypotension, hynoatremia, and AKI. PMH of COPD, rheumatoid arthritis, obesity, CHF, pulmonary embolism, type 2 diabetes, DVT, hypertension, hyperlipidemia, hypothyroidism, chronic respiratory failure on 6 L [place] and nighttime BiPAP, nicotine  dependence, atrial fibrillation s/p cardioversion May 2025. Two recent hospitalizations for respiratory failure related to CHF and pneumonia, respiratory failure and epistaxis 2/2 to [place] and DOAC.    PT Comments  OT in room with pt on arrival, pt sitting EOB.  Overlap for mobility for pt and staff safety.  She is able to stand from raised bed x 1 and lower height bed on 2nd attempt.  She is able to sidestep left and right along EOB limitied by fatigue and SOB.  Sats monitored throughout session with increased O2 through HFNC at times to aid recovery.  Participated in exercises as described below.  She is assisted back to supine when she is fatigued with mod a x 1 for LE's.     If plan is discharge home, recommend the following: Assistance with cooking/housework;Assist for transportation;Help with stairs or ramp for entrance;Two people to help with walking and/or transfers;Two people to help with bathing/dressing/bathroom   Can travel by private vehicle        Equipment Recommendations       Recommendations for Other Services       Precautions / Restrictions Precautions Precautions: Fall Recall of Precautions/Restrictions: Intact Restrictions Weight Bearing Restrictions Per Provider Order: No     Mobility  Bed Mobility Overal bed mobility: Needs Assistance         Sit to supine: Mod assist, HOB elevated,  Used rails   General bed mobility comments: sitting EOB with OT upon arrival.  assist to get LE's back onto bed. Patient Response: Cooperative  Transfers Overall transfer level: Needs assistance Equipment used: Rolling walker (2 wheels) Transfers: Sit to/from Stand Sit to Stand: Contact guard assist, +2 physical assistance, Min assist           General transfer comment: 1st attempt from raised bed, 2nd from low position.    Ambulation/Gait Ambulation/Gait assistance: Contact guard assist, +2 physical assistance Gait Distance (Feet): 5 Feet Assistive device: Rolling walker (2 wheels) Gait Pattern/deviations: Step-to pattern Gait velocity: dec     General Gait Details: sidestep left/right   Stairs             Wheelchair Mobility     Tilt Bed Tilt Bed Patient Response: Cooperative  Modified Rankin (Stroke Patients Only)       Balance Overall balance assessment: Needs assistance Sitting-balance support: Bilateral upper extremity supported, Feet supported Sitting balance-Leahy Scale: Good     Standing balance support: Bilateral upper extremity supported, Reliant on assistive device for balance, During functional activity Standing balance-Leahy Scale: Fair Standing balance comment: is able to stand with cga x 2 wth RW with no buckling noted                            Communication Communication Communication: No apparent difficulties  Cognition Arousal: Alert Behavior During Therapy: WFL for tasks assessed/performed, Anxious  Following commands: Intact      Cueing Cueing Techniques: Verbal cues, Tactile cues  Exercises Other Exercises Other Exercises: BLE seated ex 2 x 10    General Comments        Pertinent Vitals/Pain Pain Assessment Pain Assessment: Faces Faces Pain Scale: Hurts little more Pain Location: bilateral knees, but improved. Pain Descriptors / Indicators: Discomfort,  Moaning Pain Intervention(s): Monitored during session, Limited activity within patient's tolerance    Home Living                          Prior Function            PT Goals (current goals can now be found in the care plan section) Progress towards PT goals: Progressing toward goals    Frequency    Min 2X/week      PT Plan      Co-evaluation              AM-PAC PT 6 Clicks Mobility   Outcome Measure  Help needed turning from your back to your side while in a flat bed without using bedrails?: A Lot Help needed moving from lying on your back to sitting on the side of a flat bed without using bedrails?: A Lot Help needed moving to and from a bed to a chair (including a wheelchair)?: A Little Help needed standing up from a chair using your arms (e.g., wheelchair or bedside chair)?: A Little Help needed to walk in hospital room?: A Little Help needed climbing 3-5 steps with a railing? : Total 6 Click Score: 14    End of Session Equipment Utilized During Treatment: Gait belt;Oxygen Activity Tolerance: Patient tolerated treatment well;Patient limited by fatigue Patient left: in bed;with call bell/phone within reach;with bed alarm set Nurse Communication: Mobility status PT Visit Diagnosis: Repeated falls (R29.6);Muscle weakness (generalized) (M62.81);History of falling (Z91.81);Unsteadiness on feet (R26.81);Difficulty in walking, not elsewhere classified (R26.2);Other abnormalities of gait and mobility (R26.89)     Time: 8870-8846 PT Time Calculation (min) (ACUTE ONLY): 24 min  Charges:    $Gait Training: 8-22 mins PT General Charges $$ ACUTE PT VISIT: 1 Visit                    Lauraine Gills, PTA 06/08/24, 12:58 PM

## 2024-06-08 NOTE — [place] FL2 (Signed)
 [place]  MEDICAID FL2 LEVEL OF CARE FORM     IDENTIFICATION  Patient Name: [name] Birthdate: 01-Dec-1956 Sex: female Admission Date (Current Location): 05/30/2024  [facility] and [place] Number:      Facility and Address:  [facility], [address], [place], [place] [address]      Provider Number: [id number]  Attending Physician Name and Address:  Kandis Devaughn Sayres, MD  Relative Name and Phone Number:  King,(DIL)Jennifer (Daughter)  [phone] (Mobile)    Current Level of Care: Hospital Recommended Level of Care: Skilled Nursing Facility Prior Approval Number:    Date Approved/Denied:   PASRR Number: [id number]  Discharge Plan: SNF    Current Diagnoses: Patient Active Problem List   Diagnosis Date Noted   Hypercapnic respiratory failure (HCC) 05/31/2024   Acute hypoxic respiratory failure (HCC) 05/30/2024   History of pulmonary embolism 05/21/2024   Obesity, Class III, BMI 40-49.9 (morbid obesity) 05/21/2024   Atrial fibrillation (HCC) 05/21/2024   Chronic anticoagulation 05/21/2024   Acute on chronic respiratory failure with hypoxia (HCC) 05/21/2024   Epistaxis 05/21/2024   History of DVT (deep vein thrombosis) 05/21/2024   CHF exacerbation (HCC) 05/21/2024   Acute on chronic heart failure with preserved ejection fraction (HFpEF) (HCC) 05/20/2024   Type 2 diabetes mellitus without complication, without long-term current use of insulin  (HCC) 05/28/2023   OSA on CPAP 05/29/2022   COVID-19 01/05/2021   Hypoxia    Chronic obstructive pulmonary disease (HCC)    Mitral valve annular calcification 04/07/2018   Depression 04/07/2018   Diastolic heart failure (HCC) 02/10/2018   Family history of hemophilia B 12/02/2017   Healthcare maintenance 05/07/2017   Onychomycosis 05/07/2017   Tricompartment osteoarthritis of both knees 02/19/2017   Hyperlipidemia 11/14/2016   AKI (acute kidney injury) (HCC) 04/23/2015   Rheumatoid  arthritis (HCC) 07/08/2008   OBESITY 05/04/2008   Essential hypertension 05/04/2008   ALLERGIC RHINITIS 05/04/2008   GERD 05/04/2008   VARICOSE VEINS LOWER EXTREMITIES W/INFLAMMATION 11/02/2007   Hypothyroidism 08/18/2007    Orientation RESPIRATION BLADDER Height & Weight     Self, Time, Situation, Place  O2 (Oxygen 6 liters per nasal cannula) External catheter, Incontinent Weight: 109.2 kg Height:  5' 3 (160 cm)  BEHAVIORAL SYMPTOMS/MOOD NEUROLOGICAL BOWEL NUTRITION STATUS  Other (Comment) (n/a)  (n/a) Continent Diet (DYS 3)  AMBULATORY STATUS COMMUNICATION OF NEEDS Skin   Limited Assist Verbally  (eecyhmosis- bilateral arm and leg, erythema bilateral arms and legs)                       Personal Care Assistance Level of Assistance  Bathing, Dressing Bathing Assistance: Limited assistance   Dressing Assistance: Limited assistance     Functional Limitations Info  Sight, Hearing Sight Info: Adequate Hearing Info: Adequate      SPECIAL CARE FACTORS FREQUENCY  PT (By licensed PT), OT (By licensed OT)     PT Frequency: Min 2x weekly OT Frequency: Min 2x weekly            Contractures Contractures Info: Not present    Additional Factors Info  Code Status, Allergies Code Status Info: FULL Allergies Info: Latex, Oxycodone -acetaminophen , Penicillins, Strawberry Extract, Oxycodone -acetaminophen , Tyloxapol, Adhesive (Tape), Wound Dressing Adhesive           Current Medications (06/08/2024):  This is the current hospital active medication list Current Facility-Administered Medications  Medication Dose Route Frequency Provider Last Rate Last Admin   amiodarone  (PACERONE ) tablet 200 mg  200 mg Oral Daily Dezii, Alexandra, DO   200 mg at 06/08/24 1032   atorvastatin  (LIPITOR) tablet 10 mg  10 mg Oral QHS Dezii, Alexandra, DO   10 mg at 06/07/24 2140   budesonide  (PULMICORT ) nebulizer solution 0.5 mg  0.5 mg Nebulization BID Dezii, Alexandra, DO   0.5 mg at 06/08/24  9270   diltiazem  (CARDIZEM  CD) 24 hr capsule 240 mg  240 mg Oral Daily Kandis Devaughn Sayres, MD   240 mg at 06/07/24 9068   ferrous sulfate  tablet 325 mg  325 mg Oral Q lunch Kandis Devaughn Sayres, MD   325 mg at 06/08/24 1242   gabapentin  (NEURONTIN ) capsule 400 mg  400 mg Oral TID Kandis Devaughn Sayres, MD   400 mg at 06/08/24 1030   hydroxychloroquine  (PLAQUENIL ) tablet 200 mg  200 mg Oral Daily Dezii, Alexandra, DO   200 mg at 06/08/24 1031   insulin  aspart (novoLOG ) injection 0-6 Units  0-6 Units Subcutaneous TID WC Dezii, Alexandra, DO   1 Units at 06/06/24 1731   levothyroxine  (SYNTHROID ) tablet 150 mcg  150 mcg Oral QAC breakfast Dezii, Alexandra, DO   150 mcg at 06/08/24 0604   melatonin tablet 5 mg  5 mg Oral QHS Kandis Devaughn Sayres, MD   5 mg at 06/07/24 2140   montelukast  (SINGULAIR ) tablet 10 mg  10 mg Oral Daily Dezii, Alexandra, DO   10 mg at 06/08/24 1032   multivitamin with minerals tablet 1 tablet  1 tablet Oral Daily Kandis Devaughn Sayres, MD   1 tablet at 06/08/24 1032   oxymetazoline  (AFRIN) 0.05 % nasal spray 1 spray  1 spray Each Nare BID PRN Kandis Devaughn Sayres, MD   1 spray at 06/07/24 1755   pantoprazole  (PROTONIX ) EC tablet 40 mg  40 mg Oral Daily Kandis Devaughn Sayres, MD   40 mg at 06/08/24 1032   polyethylene glycol (MIRALAX  / GLYCOLAX ) packet 17 g  17 g Oral BID Dezii, Alexandra, DO   17 g at 06/07/24 9065   predniSONE  (DELTASONE ) tablet 5 mg  5 mg Oral Q breakfast Dezii, Alexandra, DO   5 mg at 06/08/24 1029   rivaroxaban  (XARELTO ) tablet 20 mg  20 mg Oral q AM Dezii, Alexandra, DO   20 mg at 06/08/24 0604   senna-docusate (Senokot-S) tablet 2 tablet  2 tablet Oral BID Kandis Devaughn Sayres, MD   2 tablet at 06/07/24 0932   sertraline  (ZOLOFT ) tablet 75 mg  75 mg Oral Daily Dezii, Alexandra, DO   75 mg at 06/08/24 1030     Discharge Medications: Please see discharge summary for a list of discharge medications.  Relevant Imaging Results:  Relevant Lab Results:   Additional  Information SSN: [id number]  [facility] [place] C Keilly Fatula, RN

## 2024-06-08 NOTE — TOC Transition Note (Signed)
 Transition of Care [facility]) - Discharge Note   Patient Details  Name: [name] MRN: [id number] Date of Birth: 09-Oct-1956  [facility] of [facility] [place] CM/SW Contact:  Oumou Smead C Lonzell Dorris, RN Phone Number: 06/08/2024, 1:50 PM   Clinical Narrative:     Per Tammy patient admission confirmed for today to [facility].  BIPAP has been secured and will be delivered today. Patient assigned room # 103-A Report will be called to [phone] Face sheet and medical necessity forms printed to the floor to be added to the EMS pack EMS arranged  There's a few ahead of her. Discharge summary and SNF transfer report sent in HUB.  Nurse, and family notified spoke with patient's daughter-in-law, Delon.  TOC signing off.    Final next level of care: Skilled Nursing Facility Barriers to Discharge: Barriers Resolved   Patient Goals and CMS Choice Patient states their goals for this hospitalization and ongoing recovery are:: SNF          Discharge Placement              Patient chooses bed at: [facility] [place] Patient to be transferred to facility by: [facility] Name of family member notified: Delon, Daughter-in-law Patient and family notified of of transfer: 06/08/24  Discharge Plan and Services Additional resources added to the After Visit Summary for                                       Social Drivers of Health (SDOH) Interventions SDOH Screenings   Food Insecurity: No Food Insecurity (05/24/2024)  Housing: Low Risk  (05/24/2024)  Transportation Needs: No Transportation Needs (05/24/2024)  Utilities: Not At Risk (05/21/2024)  Financial Resource Strain: High Risk (05/24/2024)  Social Connections: Socially Isolated (05/21/2024)  Tobacco Use: Medium Risk (05/30/2024)     Readmission Risk Interventions     No data to display

## 2024-06-08 NOTE — Progress Notes (Signed)
 Occupational Therapy Treatment Patient Details Name: [name] MRN: [id number] DOB: 1956/07/19 Today's Date: 06/08/2024   History of present illness Pt is a 68 y.o. female presents obtunded and hypoxic d/t being off 02 without staff knowledge at facility. Admitted for management of Acute on chronic hypoxic and hypercapnic respiratory failure, hypotension, hynoatremia, and AKI. PMH of COPD, rheumatoid arthritis, obesity, CHF, pulmonary embolism, type 2 diabetes, DVT, hypertension, hyperlipidemia, hypothyroidism, chronic respiratory failure on 6 L [place] and nighttime BiPAP, nicotine  dependence, atrial fibrillation s/p cardioversion May 2025. Two recent hospitalizations for respiratory failure related to CHF and pneumonia, respiratory failure and epistaxis 2/2 to [place] and [facility].   OT comments  Pt is seated on EOB on arrival. Pleasant and agreeable to OT session with PT joining in for standing tasks. She reports bil knee pain that is chronic, but also sore from recent fall last week. Pt found on 7-8L HFNC on entry with sp02 at 89%, placed on 9L for session with improvement to mid to upper 90's. Pt performed UB dressing with Mod A to exchange gowns d/t nose bleed. She performed x2 STS trials from EOB to RW, 1st trial from elevated bed with CGA x2 and 2nd from lowest bed height with Min A x1 to CGA x2 with ability to take lateral side steps to the R then back to the L with CGA x2. Pt required increased time between trials to recover from desat to mid 70's, low 80's with PLB education and increas to 10L HFNC able to return to >90%. Variable readings throughout session, but pt able to wean back to 7-8l HFNC at end of session and left seated EOB working with PT on BLE exercises. She will cont to require skilled acute OT services to maximize his safety and IND to return to PLOF.       If plan is discharge home, recommend the following:  Assist for transportation;Assistance with cooking/housework;Help with stairs  or ramp for entrance;A lot of help with walking and/or transfers;A lot of help with bathing/dressing/bathroom   Equipment Recommendations  Other (comment) (defer to next venue)    Recommendations for Other Services      Precautions / Restrictions Precautions Precautions: Fall Recall of Precautions/Restrictions: Intact Restrictions Weight Bearing Restrictions Per Provider Order: No       Mobility Bed Mobility               General bed mobility comments: found found seated EOB on arrival, left with PT at end of session on EOB    Transfers Overall transfer level: Needs assistance Equipment used: Rolling walker (2 wheels) Transfers: Sit to/from Stand Sit to Stand: Contact guard assist, +2 physical assistance, Min assist           General transfer comment: able to stand from elevated bed height with CGA x2 and increased time, desat to mid 70's on 9L however hard to get accurate reading; placed on 10L and improved to >90% with time and PLB; 2nd trial from lowest bed height with Min A X1 and increased time then able to lateral step to R side and L side using RW with CGA x2 before fatiguing and dropping to 80's with improved to >90 on 10L; placed back on 8L at end with PT present     Balance Overall balance assessment: Needs assistance Sitting-balance support: Bilateral upper extremity supported, Feet supported Sitting balance-Leahy Scale: Good Sitting balance - Comments: supervision static sitting EOB   Standing balance support: Bilateral upper extremity supported,  Reliant on assistive device for balance, During functional activity Standing balance-Leahy Scale: Fair Standing balance comment: RW and CGA x2 with no LOB or buckling noted this date                           ADL either performed or assessed with clinical judgement   ADL Overall ADL's : Needs assistance/impaired                 Upper Body Dressing : Moderate assistance;Sitting Upper Body  Dressing Details (indicate cue type and reason): to doff/don gown at [facility]                        Extremity/Trunk Assessment              Vision       Perception     Praxis     Communication Communication Communication: No apparent difficulties   Cognition                                              Cueing      Exercises      Shoulder Instructions       General Comments 7-8L HFNC on entry with sp02 89%, placed on 9L with improvement to 98%, then variable readings; desats to mid 70's to 80 with activity on 9L and improves to >90% with rest, PLB and 10L HFNC with ability to wean back to 8L    Pertinent Vitals/ Pain          Home Living                                          Prior Functioning/Environment              Frequency  Min 2X/week        Progress Toward Goals  OT Goals(current goals can now be found in the care plan section)  Progress towards OT goals: Progressing toward goals  Acute Rehab OT Goals Patient Stated Goal: improve function, strength and breathing OT Goal Formulation: With patient Time For Goal Achievement: 06/19/24 Potential to Achieve Goals: Good  Plan      Co-evaluation    PT/OT/SLP Co-Evaluation/Treatment: Yes Reason for Co-Treatment: Complexity of the patient's impairments (multi-system involvement);For patient/therapist safety;To address functional/ADL transfers PT goals addressed during session: Mobility/safety with mobility;Strengthening/ROM OT goals addressed during session: ADL's and self-care      AM-PAC OT 6 Clicks Daily Activity     Outcome Measure   Help from another person eating meals?: None Help from another person taking care of personal grooming?: A Little Help from another person toileting, which includes using toliet, bedpan, or urinal?: A Lot Help from another person bathing (including washing, rinsing, drying)?: A Lot Help from another person to  put on and taking off regular upper body clothing?: A Little Help from another person to put on and taking off regular lower body clothing?: A Lot 6 Click Score: 16    End of Session Equipment Utilized During Treatment: Rolling walker (2 wheels);Oxygen  OT Visit Diagnosis: Other abnormalities of gait and mobility (R26.89)   Activity Tolerance Patient tolerated treatment well   Patient Left  (  handoff to PT)   Nurse Communication Mobility status        Time: 8881-8853 OT Time Calculation (min): 28 min  Charges: OT General Charges $OT Visit: 1 Visit OT Treatments $Therapeutic Activity: 8-22 mins  Anwar Crill, OTR/L  06/08/24, 2:29 PM   Yalda Herd E Chanell Nadeau 06/08/2024, 2:26 PM

## 2024-06-08 NOTE — Discharge Summary (Signed)
 MAYTTE JACOT FMW:[id number] DOB: [birth year] DOA: 05/30/2024  PCP: Tammy Rasmussen T, PA-C  Admit date: 05/30/2024 Discharge date: 06/08/2024  Time spent: 35 minutes  Recommendations for Outpatient Follow-up:  Pcp f/u     Discharge Diagnoses:  Principal Problem:   Acute hypoxic respiratory failure (HCC) Active Problems:   Hypercapnic respiratory failure (HCC)   Discharge Condition: stable  Diet recommendation: heart healthy carb modified  Filed Weights   05/30/24 1434  Weight: 109.2 kg    History of present illness:  From admission h and p AMANTHA SKLAR is a 68 y.o. female with medical history significant of COPD, rheumatoid arthritis, obesity, CHF, pulmonary embolism, type 2 diabetes, DVT, hypertension, hyperlipidemia, hypothyroidism, chronic respiratory failure on 6 L [place] and nighttime BiPAP, nicotine  dependence, atrial fibrillation status post cardioversion May 2025, currently on amiodarone  and Xarelto .  Patient was recently hospitalized 5/5 through 5/16 at [facility] for multifactorial respiratory failure related to CHF and pneumonia.  She was admitted to [facility] on 6/6 through 6/9 due to respiratory failure and epistaxis.  Patient reports that her home BiPAP was broken causing inadequate oxygen at home.  Admission was also complicated by epistaxis secondary to nasal cannula and DOAC. On this admission patient presents obtunded and hypoxic into the 50s.  Reportedly there was issue with patient's breathing device at her living facility and she was placed on oxygen tank which apparently ran out for a prolonged period of time without staff knowledge.  On arrival to the ED patient was hypoxic in the 50%.  Patient required bagging but was alert and responsive.  Patient was placed on BiPAP.  And had improvement. Initial pCO2 81, chest x-ray with interstitial prominence bilaterally and bilateral pleural effusions.  CMP revealed bump in creatinine, hyponatremia, mildly elevated troponin.   Patient was also found to be hypotensive but fluid responsive after 500 cc NS. We were asked to admit the patient for further workup.   On my evaluation patient is on BiPAP.  O2 sats consistently in the 90s, blood pressure has improved significantly.  MAP now easily above 65.  Patient is very drowsy but is able to nod head yes and no.  She is following some commands.  She is not able to provide history or background.  She does nod that she has not recently had fever or felt sick.  She is not able to explain what happened to her BiPAP. She denies chest pain.   Per discussion with the patient's daughter-in-law, Delon, they have been working to get the patient her desired BiPAP mask at [employer] but have been unable to get the one that the patient likes.  Because of this, the patient has been refusing to wear her BiPAP at night.  Hospital Course:   # Acute on chronic hypoxic respiratory failure # OHS # OSA Recent admit for respiratory distress 2/2 malfunctioning bipap machine, that's apparently fixed but now doesn't have mask that fits her so arrived her hypoxic and in respiratory distress, this is resolved with use of bipap here - TOC says new bipap machine with mask will be delivered to SNF today   # Anemia of chronic disease Labs suggestive of chronic disease, recent large volume epistaxis plus daily lab draws likely contributing, hgb stable in the 7s. No melena or other bleeding, stool is brown. - monitor intermittently   # COPD No exacerbation - home trelegy   # A-fib Rate controlled but BPs soft - home dilt, amio, xarelto , have reduced dose of  dilt from 360 to 240   # Hypothyroid - home synthroid    # AKI Resolved   # HFpEF Appears compensated, bnp wnl - home lasix    # Neuropathy, chronic pain Stable - home gabapentin , tramadol    # RA Stable - home plaquenil , arava , prednisone    # T2DM Controlled    Procedures: none   Consultations: none  Discharge  Exam: Vitals:   06/08/24 1035 06/08/24 1215  BP: (!) 95/59 115/64  Pulse:  71  Resp:  20  Temp:  98 F (36.7 C)  SpO2:  98%    General exam: Appears calm and comfortable, chronically ill Respiratory system: Clear to auscultation. Respiratory effort normal. Cardiovascular system: S1 & S2 heard, irreg, distant Gastrointestinal system: Abdomen is obese, soft and nontender.   Central nervous system: Alert and oriented. No focal neurological deficits. Extremities: Symmetric 5 x 5 power. Skin: venous stasis changes in LEs Psychiatry: Judgement and insight appear normal. Mood & affect appropriate.   Discharge Instructions   Discharge Instructions     Diet - low sodium heart healthy   Complete by: As directed    Increase activity slowly   Complete by: As directed       Allergies as of 06/08/2024       Reactions   Latex Hives, Itching, Dermatitis   Blisters (also) Skin blisters   Oxycodone -acetaminophen  Nausea Only, Other (See Comments)   Violent Vomiting Violent Vomiting   Penicillins Anaphylaxis, Swelling, Rash   Has patient had a PCN reaction causing immediate rash, facial/tongue/throat swelling, SOB or lightheadedness with hypotension: Yes Has patient had a PCN reaction causing severe rash involving mucus membranes or skin necrosis: No Has patient had a PCN reaction that required hospitalization No Has patient had a PCN reaction occurring within the last 10 years: No If all of the above answers are NO, then may proceed with Cephalosporin use.   Strawberry Extract Hives, Itching, Rash, Anaphylaxis, Dermatitis, Swelling   Oxycodone -acetaminophen  Nausea And Vomiting   Tyloxapol Nausea And Vomiting   Adhesive [tape] Rash   Patient prefers paper tape   Wound Dressing Adhesive Dermatitis        Medication List     STOP taking these medications    traMADol  50 MG tablet Commonly known as: ULTRAM        TAKE these medications    acetaminophen  500 MG  tablet Commonly known as: TYLENOL  Take 500 mg by mouth every 8 (eight) hours as needed for mild pain (pain score 1-3).   amiodarone  200 MG tablet Commonly known as: PACERONE  Take 200 mg by mouth daily.   atorvastatin  10 MG tablet Commonly known as: LIPITOR Take 10 mg by mouth at bedtime.   benzonatate 100 MG capsule Commonly known as: TESSALON Take 100 mg by mouth every 8 (eight) hours as needed for cough.   budesonide  0.5 MG/2ML nebulizer solution Commonly known as: PULMICORT  Take 0.5 mg by nebulization 2 (two) times daily.   Cyanocobalamin 1000 MCG Tbcr Take 1 tablet by mouth daily.   diltiazem  240 MG 24 hr capsule Commonly known as: CARDIZEM  CD Take 1 capsule (240 mg total) by mouth daily. What changed:  medication strength how much to take   ferrous sulfate  325 (65 FE) MG EC tablet Take 325 mg by mouth daily with breakfast.   folic acid  1 MG tablet Commonly known as: FOLVITE  Take 1 mg by mouth daily.   gabapentin  400 MG capsule Commonly known as: NEURONTIN  Take 400 mg by mouth 3 (three)  times daily.   hydroxychloroquine  200 MG tablet Commonly known as: PLAQUENIL  Take 200 mg by mouth daily.   insulin  lispro 100 UNIT/ML injection Commonly known as: HUMALOG Inject 2-10 Units into the skin 3 (three) times daily before meals. Sliding scale   ipratropium-albuterol  0.5-2.5 (3) MG/3ML Soln Commonly known as: DUONEB Take 3 mLs by nebulization every 6 (six) hours.   levothyroxine  150 MCG tablet Commonly known as: SYNTHROID  Take 150 mcg by mouth daily before breakfast.   melatonin 5 MG Tabs Take 5 mg by mouth at bedtime.   montelukast  10 MG tablet Commonly known as: SINGULAIR  Take 10 mg by mouth daily.   multivitamin tablet Take 1 tablet by mouth daily.   naloxone 4 MG/0.1ML Liqd nasal spray kit Commonly known as: NARCAN Place 1 spray into the nose 3 (three) times daily as needed (symptoms of opiod overdose).   oxymetazoline  0.05 % nasal spray Commonly  known as: AFRIN Place 1 spray into both nostrils 2 (two) times daily.   pantoprazole  40 MG tablet Commonly known as: PROTONIX  Take 40 mg by mouth daily.   predniSONE  5 MG tablet Commonly known as: DELTASONE  Take 5 mg by mouth daily with breakfast.   rivaroxaban  20 MG Tabs tablet Commonly known as: XARELTO  Take 20 mg by mouth daily.   sennosides-docusate sodium  8.6-50 MG tablet Commonly known as: SENOKOT-S Take 2 tablets by mouth 2 (two) times daily.   sertraline  25 MG tablet Commonly known as: ZOLOFT  Take 25 mg by mouth daily. Takes with 50mg  tab = 75mg  daily   sertraline  50 MG tablet Commonly known as: ZOLOFT  Take 50 mg by mouth daily. Takes with 25mg  tab = 75mg  daily               Durable Medical Equipment  (From admission, onward)           Start     Ordered   06/07/24 1512  For home use only DME Bipap  Once       Question:  Length of Need  Answer:  Lifetime   06/07/24 1512           Allergies  Allergen Reactions   Latex Hives, Itching and Dermatitis    Blisters (also)  Skin blisters   Oxycodone -Acetaminophen  Nausea Only and Other (See Comments)    Violent Vomiting Violent Vomiting   Penicillins Anaphylaxis, Swelling and Rash    Has patient had a PCN reaction causing immediate rash, facial/tongue/throat swelling, SOB or lightheadedness with hypotension: Yes Has patient had a PCN reaction causing severe rash involving mucus membranes or skin necrosis: No Has patient had a PCN reaction that required hospitalization No Has patient had a PCN reaction occurring within the last 10 years: No If all of the above answers are NO, then may proceed with Cephalosporin use.    Strawberry Extract Hives, Itching, Rash, Anaphylaxis, Dermatitis and Swelling   Oxycodone -Acetaminophen  Nausea And Vomiting   Tyloxapol Nausea And Vomiting   Adhesive [Tape] Rash    Patient prefers paper tape   Wound Dressing Adhesive Dermatitis    Follow-up Information      [facility]. Go on 06/01/2024.   Specialty: Cardiology Why: Hospital Follow-Up 06/08/24 @ 1:15 Please bring all medications or list to follow-up appointment [facility], [address], [address] Contact information: [address] [place]  [address] [phone]        [name] DASEN, PA-C Follow up.   Specialty: Physician  Field seismologist information: [address] [place] [address] [phone]                  The results of significant diagnostics from this hospitalization (including imaging, microbiology, ancillary and laboratory) are listed below for reference.    Significant Diagnostic Studies: ECHOCARDIOGRAM COMPLETE Result Date: 05/31/2024    ECHOCARDIOGRAM REPORT   Patient Name:   Dorethy K Helser Date of Exam: 05/31/2024 Medical Rec #:  [id number]       Height:       63.0 in Accession #:    [id number]      Weight:       240.7 lb Date of Birth:  Dec 19, 1955      BSA:          2.092 m Patient Age:    67 years        BP:           109/71 mmHg Patient Gender: F               HR:           69 bpm. Exam Location:  [facility] Procedure: 2D Echo, Cardiac Doppler and Color Doppler (Both Spectral and Color            Flow Doppler were utilized during procedure). Indications:     Congestive heart failure I50.9  History:         Patient has prior history of Echocardiogram examinations, most                  recent 02/17/2018. Signs/Symptoms:Dyspnea and Murmur; Risk                  Factors:Hypertension.  Sonographer:     Christopher Furnace Referring Phys:  [id number] ALEXANDRA DEZII Diagnosing Phys: Dwayne D Callwood MD IMPRESSIONS  1. Left ventricular ejection fraction, by estimation, is 55 to 60%. The left ventricle has normal function. The left ventricle has no regional wall motion abnormalities. There is mild concentric left ventricular hypertrophy. Left ventricular diastolic parameters are consistent  with Grade II diastolic dysfunction (pseudonormalization).  2. Right ventricular systolic function is normal. The right ventricular size is normal.  3. The mitral valve is grossly normal. Moderate mitral valve regurgitation.  4. The aortic valve is calcified. Aortic valve regurgitation is not visualized. Aortic valve sclerosis/calcification is present, without any evidence of aortic stenosis. FINDINGS  Left Ventricle: Left ventricular ejection fraction, by estimation, is 55 to 60%. The left ventricle has normal function. The left ventricle has no regional wall motion abnormalities. Strain was performed and the global longitudinal strain is indeterminate. Global longitudinal strain performed but not reported based on interpreter judgement due to suboptimal tracking. The left ventricular internal cavity size was normal in size. There is mild concentric left ventricular hypertrophy. Left ventricular diastolic parameters are consistent with Grade II diastolic dysfunction (pseudonormalization). Right Ventricle: The right ventricular size is normal. No increase in right ventricular wall thickness. Right ventricular systolic function is normal. Left [facility]: Left atrial size was normal in size. Right [facility]: Right atrial size was normal in size. Pericardium: There is no evidence of pericardial effusion. Mitral Valve: The mitral valve is grossly normal. There is mild thickening of the mitral valve leaflet(s). Moderate mitral valve regurgitation. MV peak gradient, 10.6 mmHg. The mean mitral valve gradient is 3.0 mmHg. Tricuspid Valve: The tricuspid valve is normal in structure. Tricuspid valve regurgitation is trivial. Aortic Valve: The aortic valve is calcified.  Aortic valve regurgitation is not visualized. Aortic valve sclerosis/calcification is present, without any evidence of aortic stenosis. Aortic valve mean gradient measures 4.5 mmHg. Aortic valve peak gradient measures 7.2 mmHg. Aortic valve area, by VTI measures 6.77  cm. Pulmonic Valve: The pulmonic valve was normal in structure. Pulmonic valve regurgitation is trivial. Aorta: The ascending aorta was not well visualized. IAS/Shunts: No atrial level shunt detected by color flow Doppler. Additional Comments: 3D was performed not requiring image post processing on an independent workstation and was indeterminate.  LEFT VENTRICLE PLAX 2D LVIDd:         5.10 cm   Diastology LVIDs:         3.50 cm   LV e' medial:    7.51 cm/s LV PW:         1.30 cm   LV E/e' medial:  18.8 LV IVS:        1.50 cm   LV e' lateral:   13.20 cm/s LVOT diam:     3.30 cm   LV E/e' lateral: 10.7 LV SV:         183 LV SV Index:   87 LVOT Area:     8.55 cm  RIGHT VENTRICLE RV Basal diam:  3.80 cm RV Mid diam:    2.90 cm RV S prime:     11.60 cm/s TAPSE (M-mode): 3.0 cm LEFT ATRIUM           Index        RIGHT ATRIUM           Index LA diam:      3.60 cm 1.72 cm/m   RA Area:     14.70 cm LA Vol (A2C): 36.6 ml 17.50 ml/m  RA Volume:   31.50 ml  15.06 ml/m LA Vol (A4C): 81.0 ml 38.72 ml/m  AORTIC VALVE                    PULMONIC VALVE AV Area (Vmax):    7.02 cm     PR End Diast Vel: 9.73 msec AV Area (Vmean):   6.44 cm AV Area (VTI):     6.77 cm AV Vmax:           134.00 cm/s AV Vmean:          95.200 cm/s AV VTI:            0.270 m AV Peak Grad:      7.2 mmHg AV Mean Grad:      4.5 mmHg LVOT Vmax:         110.00 cm/s LVOT Vmean:        71.700 cm/s LVOT VTI:          0.214 m LVOT/AV VTI ratio: 0.79  AORTA Ao Root diam: 3.90 cm MITRAL VALVE                TRICUSPID VALVE MV Area (PHT): 3.76 cm     TR Peak grad:   36.0 mmHg MV Area VTI:   4.06 cm     TR Vmax:        300.00 cm/s MV Peak grad:  10.6 mmHg MV Mean grad:  3.0 mmHg     SHUNTS MV Vmax:       1.63 m/s     Systemic VTI:  0.21 m MV Vmean:      75.6 cm/s    Systemic Diam: 3.30 cm MV Decel Time: 202 msec MV E velocity: 141.00  cm/s MV A velocity: 72.80 cm/s MV E/A ratio:  1.94 Cara JONETTA Lovelace MD Electronically signed by Cara JONETTA Lovelace MD  Signature Date/Time: 05/31/2024/5:44:01 PM    Final    DG Chest Port 1 View Result Date: 05/31/2024 CLINICAL DATA:  Pleural effusion EXAM: PORTABLE CHEST 1 VIEW COMPARISON:  May 30, 2024 FINDINGS: Residual but improving bilateral congestive changes with persistent elevation right hemidiaphragm, right lower lobe atelectasis and small right effusion. Mild cardiomegaly correlate with CHF IMPRESSION: Improving CHF. Electronically Signed   By: Franky Chard M.D.   On: 05/31/2024 11:58   DG Chest Portable 1 View Result Date: 05/30/2024 CLINICAL DATA:  Shortness of breath, respiratory distress. EXAM: PORTABLE CHEST 1 VIEW COMPARISON:  05/20/2024. FINDINGS: The heart is enlarged the mediastinal contour stable. There is atherosclerotic calcification of the aorta. The pulmonary vasculature is distended. Elevation the right diaphragm is noted. Interstitial prominence is present bilaterally. There is mild airspace disease at the lung bases. There are possible small bilateral pleural effusions. No pneumothorax is seen. Surgical clips are noted in the thyroid  bed. IMPRESSION: 1. Cardiomegaly with pulmonary vascular congestion. 2. Interstitial prominence bilaterally with mild airspace disease at the lung bases, possible edema versus infection. 3. Possible small bilateral pleural effusions. Electronically Signed   By: Leita Birmingham M.D.   On: 05/30/2024 14:44   DG Chest Portable 1 View Result Date: 05/20/2024 CLINICAL DATA:  Shortness of breath beginning about 30 minutes ago. Baseline oxygen use. Low oxygen saturation. EXAM: PORTABLE CHEST 1 VIEW COMPARISON:  04/29/2024 FINDINGS: Shallow inspiration. Cardiac enlargement. Mild pulmonary vascular congestion. Hazy infiltrates in the lungs likely indicating edema. Small bilateral pleural effusions. No pneumothorax. Prominence of the left pulmonary outflow tract. Calcification of the aorta. Similar appearance to the previous study. IMPRESSION: Cardiac enlargement with pulmonary  vascular congestion and mild pulmonary infiltrates, likely edema. Small bilateral pleural effusions. Electronically Signed   By: Elsie Gravely M.D.   On: 05/20/2024 20:23    Microbiology: Recent Results (from the past 240 hours)  Resp panel by RT-PCR (RSV, Flu A&B, Covid) Anterior Nasal Swab     Status: None   Collection Time: 05/30/24  2:25 PM   Specimen: Anterior Nasal Swab  Result Value Ref Range Status   SARS Coronavirus 2 by RT PCR NEGATIVE NEGATIVE Final    Comment: (NOTE) SARS-CoV-2 target nucleic acids are NOT DETECTED.  The SARS-CoV-2 RNA is generally detectable in upper respiratory specimens during the acute phase of infection. The lowest concentration of SARS-CoV-2 viral copies this assay can detect is 138 copies/mL. A negative result does not preclude SARS-Cov-2 infection and should not be used as the sole basis for treatment or other patient management decisions. A negative result may occur with  improper specimen collection/handling, submission of specimen other than nasopharyngeal swab, presence of viral mutation(s) within the areas targeted by this assay, and inadequate number of viral copies(<138 copies/mL). A negative result must be combined with clinical observations, patient history, and epidemiological information. The expected result is Negative.  Fact Sheet for Patients:  BloggerCourse.com  Fact Sheet for Healthcare Providers:  SeriousBroker.it  This test is no t yet approved or cleared by the [place]  FDA and  has been authorized for detection and/or diagnosis of SARS-CoV-2 by FDA under an Emergency Use Authorization (EUA). This EUA will remain  in effect (meaning this test can be used) for the duration of the COVID-19 declaration under Section 564(b)(1) of the Act, 21 U.S.C.section 360bbb-3(b)(1), unless the authorization is terminated  or revoked sooner.       Influenza A by PCR NEGATIVE  NEGATIVE Final   Influenza B by PCR NEGATIVE NEGATIVE Final    Comment: (NOTE) The Xpert Xpress SARS-CoV-2/FLU/RSV plus assay is intended as an aid in the diagnosis of influenza from Nasopharyngeal swab specimens and should not be used as a sole basis for treatment. Nasal washings and aspirates are unacceptable for Xpert Xpress SARS-CoV-2/FLU/RSV testing.  Fact Sheet for Patients: BloggerCourse.com  Fact Sheet for Healthcare Providers: SeriousBroker.it  This test is not yet approved or cleared by the [place]  FDA and has been authorized for detection and/or diagnosis of SARS-CoV-2 by FDA under an Emergency Use Authorization (EUA). This EUA will remain in effect (meaning this test can be used) for the duration of the COVID-19 declaration under Section 564(b)(1) of the Act, 21 U.S.C. section 360bbb-3(b)(1), unless the authorization is terminated or revoked.     Resp Syncytial Virus by PCR NEGATIVE NEGATIVE Final    Comment: (NOTE) Fact Sheet for Patients: BloggerCourse.com  Fact Sheet for Healthcare Providers: SeriousBroker.it  This test is not yet approved or cleared by the [place]  FDA and has been authorized for detection and/or diagnosis of SARS-CoV-2 by FDA under an Emergency Use Authorization (EUA). This EUA will remain in effect (meaning this test can be used) for the duration of the COVID-19 declaration under Section 564(b)(1) of the Act, 21 U.S.C. section 360bbb-3(b)(1), unless the authorization is terminated or revoked.  Performed at [facility], [address] [place], [place] [address]   Blood culture (routine x 2)     Status: None   Collection Time: 05/30/24  2:25 PM   Specimen: BLOOD RIGHT ARM  Result Value Ref Range Status   Specimen Description BLOOD RIGHT ARM  Final   Special Requests   Final    BOTTLES DRAWN AEROBIC AND ANAEROBIC  Blood Culture results may not be optimal due to an inadequate volume of blood received in culture bottles   Culture   Final    NO GROWTH 5 DAYS Performed at [facility], [address] [place], [place] [address]    Report Status 06/04/2024 FINAL  Final  Blood culture (routine x 2)     Status: None   Collection Time: 05/30/24  3:01 PM   Specimen: BLOOD RIGHT HAND  Result Value Ref Range Status   Specimen Description BLOOD RIGHT HAND  Final   Special Requests   Final    BOTTLES DRAWN AEROBIC AND ANAEROBIC Blood Culture results may not be optimal due to an inadequate volume of blood received in culture bottles   Culture   Final    NO GROWTH 5 DAYS Performed at [facility], [address], [place], [place] [address]    Report Status 06/04/2024 FINAL  Final  MRSA Next Gen by PCR, Nasal     Status: None   Collection Time: 05/31/24  5:09 AM   Specimen: Nasal Mucosa; Nasal Swab  Result Value Ref Range Status   MRSA by PCR Next Gen NOT DETECTED NOT DETECTED Final    Comment: (NOTE) The GeneXpert MRSA Assay (FDA approved for NASAL specimens only), is one component of a comprehensive MRSA colonization surveillance program. It is not intended to diagnose MRSA infection nor to guide or monitor treatment for MRSA infections. Test performance is not FDA approved in patients less than 38 years old. Performed at [facility], [address], [place], [place] [address]  Labs: Basic Metabolic Panel: Recent Labs  Lab 06/02/24 0341 06/06/24 0534  NA 140 135  K 3.8 4.3  CL 97* 96*  CO2 34* 31  GLUCOSE 77 85  BUN 29* 18  CREATININE 1.00 1.07*  CALCIUM  8.9 8.4*  MG 2.2  --   PHOS 2.5  --    Liver Function Tests: Recent Labs  Lab 06/02/24 0341  AST 12*  ALT 15  ALKPHOS 52  BILITOT 0.6  PROT 5.6*  ALBUMIN 2.9*   No results for input(s): LIPASE, AMYLASE in the last 168 hours. No results for input(s): AMMONIA in the last 168  hours. CBC: Recent Labs  Lab 06/02/24 0341 06/06/24 0534  WBC 4.2 7.4  NEUTROABS 2.8  --   HGB 7.8* 7.6*  HCT 25.3* 25.5*  MCV 99.6 101.2*  PLT 226 175   Cardiac Enzymes: No results for input(s): CKTOTAL, CKMB, CKMBINDEX, TROPONINI in the last 168 hours. BNP: BNP (last 3 results) Recent Labs    05/20/24 2006 05/30/24 1658  BNP 82.2 750.8*    ProBNP (last 3 results) No results for input(s): PROBNP in the last 8760 hours.  CBG: Recent Labs  Lab 06/07/24 1122 06/07/24 1613 06/07/24 2157 06/08/24 0751 06/08/24 1212  GLUCAP 119* 169* 139* 83 90       Signed:  Devaughn KATHEE Ban MD.  [facility] 06/08/2024, 12:51 PM

## 2024-06-08 NOTE — Progress Notes (Deleted)
   ADVANCED HEART FAILURE NEW PATIENT CLINIC NOTE  Referring Physician: [name] ONEIDA DEVONNA  Primary Care: [name] ONEIDA DEVONNA Primary Cardiologist:  HPI: [name] is a 68 y.o. female with a PMH of COPD, RA, obesity, CHF, PE, T2DM, DVT, chronic hypoxic respiratory failure, afib who presents for initial visit for further evaluation and treatment of heart failure/cardiomyopathy.      {Anything typed between these two boxes will persist and can be pulled forward to future notes. This phrase will delete itself when the note is signed :1}      SUBJECTIVE:   PMH, current medications, allergies, social history, and family history reviewed in epic.  PHYSICAL EXAM: There were no vitals filed for this visit. GENERAL: Well nourished and in no apparent distress at rest.  PULM:  Normal work of breathing, clear to auscultation bilaterally. Respirations are unlabored.  CARDIAC:  JVP: ***         Normal rate with regular rhythm. No murmurs, rubs or gallops.  *** edema. Warm and well perfused extremities. ABDOMEN: Soft, non-tender, non-distended. NEUROLOGIC: Patient is oriented x3 with no focal or lateralizing neurologic deficits.    DATA REVIEW  ECG: ***    ECHO: ***  CATH: ***    Heart failure review: - Classification: {HFCLASS:30917} - Etiology: {Cardiomyopathy:30918} - NYHA Class:  - Volume status: {volumechf:30919} - ACEi/ARB/ARNI: {HF:30752} - Aldosterone antagonist: {HF:30752} - Beta-blocker: {HF:30752} - Digoxin: {HF:30752} - Hydralazine/Nitrates: {HF:30752} - SGLT2i: {HF:30752} - GLP-1: {GLP:30906} - Advanced therapies: {Advancedtherapies:30916} - ICD: {ICD:30901}   ASSESSMENT & PLAN:  ***  Follow up in ***  Morene Brownie, MD Advanced Heart Failure Mechanical Circulatory Support 06/08/24

## 2024-06-08 NOTE — Progress Notes (Signed)
 Patient left unit alert on o2 with no distress noted by stretcher with [facility] transport. Patient going to [facility].

## 2024-06-08 NOTE — TOC Progression Note (Addendum)
 Transition of Care [facility]) - Progression Note    Patient Details  Name: [name] MRN: [id number] Date of Birth: [birth year]  Transition of Care [facility]) CM/SW Contact  Tomasa JAYSON Childes, RN Phone Number: 06/08/2024, 10:09 AM  Clinical Narrative:    Retreived incoming call from Tammy, Admissions Coordinator. She stated she had spoken with Tess from [employer] and has submitted BIPAP settings and order.   12:16pm Spoke with Tammy. BIPAP is being purchased by [facility] and will be delivered today. Patient approved for admissions today per Tammy. MD notified.          Expected Discharge Plan and Services                                               Social Determinants of Health (SDOH) Interventions SDOH Screenings   Food Insecurity: No Food Insecurity (05/24/2024)  Housing: Low Risk  (05/24/2024)  Transportation Needs: No Transportation Needs (05/24/2024)  Utilities: Not At Risk (05/21/2024)  Financial Resource Strain: High Risk (05/24/2024)  Social Connections: Socially Isolated (05/21/2024)  Tobacco Use: Medium Risk (05/30/2024)    Readmission Risk Interventions     No data to display

## 2024-06-13 ENCOUNTER — Emergency Department

## 2024-06-13 ENCOUNTER — Emergency Department
Admission: EM | Admit: 2024-06-13 | Discharge: 2024-06-14 | Disposition: A | Discharge status: Skilled Nursing Facility | Source: Home / Self Care | Attending: Emergency Medicine | Admitting: Emergency Medicine

## 2024-06-13 ENCOUNTER — Other Ambulatory Visit: Payer: Self-pay

## 2024-06-13 DIAGNOSIS — W01198A Fall on same level from slipping, tripping and stumbling with subsequent striking against other object, initial encounter: Secondary | ICD-10-CM | POA: Insufficient documentation

## 2024-06-13 DIAGNOSIS — Y92002 Bathroom of unspecified non-institutional (private) residence single-family (private) house as the place of occurrence of the external cause: Secondary | ICD-10-CM | POA: Insufficient documentation

## 2024-06-13 DIAGNOSIS — J320 Chronic maxillary sinusitis: Secondary | ICD-10-CM | POA: Insufficient documentation

## 2024-06-13 DIAGNOSIS — E039 Hypothyroidism, unspecified: Secondary | ICD-10-CM | POA: Insufficient documentation

## 2024-06-13 DIAGNOSIS — J9611 Chronic respiratory failure with hypoxia: Secondary | ICD-10-CM | POA: Insufficient documentation

## 2024-06-13 DIAGNOSIS — J9622 Acute and chronic respiratory failure with hypercapnia: Secondary | ICD-10-CM | POA: Diagnosis not present

## 2024-06-13 DIAGNOSIS — I10 Essential (primary) hypertension: Secondary | ICD-10-CM | POA: Insufficient documentation

## 2024-06-13 DIAGNOSIS — S81812A Laceration without foreign body, left lower leg, initial encounter: Secondary | ICD-10-CM | POA: Diagnosis not present

## 2024-06-13 LAB — BASIC METABOLIC PANEL WITH GFR
Anion gap: 11 (ref 5–15)
BUN: 20 mg/dL (ref 8–23)
CO2: 29 mmol/L (ref 22–32)
Calcium: 8.3 mg/dL — ABNORMAL LOW (ref 8.9–10.3)
Chloride: 93 mmol/L — ABNORMAL LOW (ref 98–111)
Creatinine, Ser: 1.04 mg/dL — ABNORMAL HIGH (ref 0.44–1.00)
GFR, Estimated: 59 mL/min — ABNORMAL LOW (ref >60)
Glucose, Bld: 125 mg/dL — ABNORMAL HIGH (ref 70–99)
Potassium: 3.9 mmol/L (ref 3.5–5.1)
Sodium: 133 mmol/L — ABNORMAL LOW (ref 135–145)

## 2024-06-13 LAB — CBC WITH DIFFERENTIAL/PLATELET
Abs Immature Granulocytes: 0.26 10*3/uL — ABNORMAL HIGH (ref 0.00–0.07)
Basophils Absolute: 0 10*3/uL (ref 0.0–0.1)
Basophils Relative: 0 %
Eosinophils Absolute: 0 10*3/uL (ref 0.0–0.5)
Eosinophils Relative: 0 %
HCT: 24.3 % — ABNORMAL LOW (ref 36.0–46.0)
Hemoglobin: 7.4 g/dL — ABNORMAL LOW (ref 12.0–15.0)
Immature Granulocytes: 2 %
Lymphocytes Relative: 4 %
Lymphs Abs: 0.4 10*3/uL — ABNORMAL LOW (ref 0.7–4.0)
MCH: 30.1 pg (ref 26.0–34.0)
MCHC: 30.5 g/dL (ref 30.0–36.0)
MCV: 98.8 fL (ref 80.0–100.0)
Monocytes Absolute: 0.7 10*3/uL (ref 0.1–1.0)
Monocytes Relative: 6 %
Neutro Abs: 9.9 10*3/uL — ABNORMAL HIGH (ref 1.7–7.7)
Neutrophils Relative %: 88 %
Platelets: 144 10*3/uL — ABNORMAL LOW (ref 150–400)
RBC: 2.46 MIL/uL — ABNORMAL LOW (ref 3.87–5.11)
RDW: 16.5 % — ABNORMAL HIGH (ref 11.5–15.5)
WBC: 11.4 10*3/uL — ABNORMAL HIGH (ref 4.0–10.5)
nRBC: 0 % (ref 0.0–0.2)

## 2024-06-13 MED ORDER — LEVOFLOXACIN 750 MG PO TABS: 750.0000 mg | ORAL_TABLET | Freq: Every day | ORAL | 0 refills | Status: DC

## 2024-06-13 MED ORDER — LEVOFLOXACIN 500 MG PO TABS
750.0000 mg | ORAL_TABLET | Freq: Once | ORAL | Status: AC
  Administered 2024-06-13: 750 mg via ORAL
  Filled 2024-06-13: qty 2

## 2024-06-13 NOTE — ED Provider Notes (Signed)
 [facility] Provider Note    Event Date/Time   First MD Initiated Contact with Patient 06/13/24 1718     (approximate)   History   Chief Complaint: Fall   HPI  [name] is a 68 y.o. female with a history of GERD, hypertension, emphysema with chronic hypoxic respiratory failure on 6 L nasal cannula at baseline who comes the ED due to a fall.  She reports recently being in the hospital, discharged home back to her facility, and was feeling in her baseline state of health when she got up to go to the bathroom.  She tripped over a slight step up threshold at the bathroom doorway, fell forward, hit her face and started having a nosebleed.  Denies headache or neck pain or any other new complaints currently.  States that she feels fine.  No chest pain or shortness of breath or cough.  No loss of consciousness.  Remembers the episode.   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    HTN (hypertension)    Hypercholesteremia    Hypothyroid    Rheumatoid arthritis(714.0)    Varicose vein of leg     Current Outpatient Rx   Order #: [id number] Class: Normal   Order #: [id number] Class: Historical Med   Order #: [id number] Class: Historical Med   Order #: [id number] Class: Historical Med   Order #: [id number] Class: Historical Med   Order #: [id number] Class: Historical Med   Order #: [id number] Class: Historical Med   Order #: [id number] Class: No Print   Order #: [id number] Class: Historical Med   Order #: [id number] Class: Historical Med   Order #: [id number] Class: Historical Med   Order #: [id number] Class: Historical Med   Order #: [id number] Class: Historical Med   Order #: [id number] Class: Historical Med   Order #: [id number] Class: Historical Med   Order #:  [id number] Class: Historical Med   Order #: [id number] Class: Historical Med   Order #: [id number] Class: Historical Med   Order #: [id number] Class: Historical Med   Order #: [id number] Class: No Print   Order #: [id number] Class: Historical Med   Order #: [id number] Class: Historical Med   Order #: [id number] Class: Historical Med   Order #: [id number] Class: Historical Med   Order #: [id number] Class: Historical Med   Order #: [id number] Class: Historical Med    Past Surgical History:  Procedure Laterality Date   APPENDECTOMY     CHOLECYSTECTOMY     ELBOW SURGERY     LEFT   ENDOSCOPIC VEIN LASER TREATMENT     RIGHT LEG   EXPLORATORY LAPAROTOMY     HAND SURGERY     RIGHT    HEEL SPUR SURGERY     BILAT   INSERTION OF MESH N/A 08/04/2017   Procedure: INSERTION OF MESH;  Surgeon: Eletha Boas, MD;  Location: [facility] OR;  Service: General;  Laterality: N/A;   partial thyroidectomy  2015   TONSILLECTOMY     TUBAL LIGATION     VENTRAL HERNIA REPAIR N/A 08/04/2017   Procedure: LAPAROSCOPIC VENTRAL INCISIONAL HERNIA REPAIR WITH MESH;  Surgeon: Eletha Boas, MD;  Location: [facility] OR;  Service: General;  Laterality: N/A;    Physical Exam   Triage Vital Signs: ED Triage Vitals  Encounter Vitals Group     BP 06/13/24 1723 (!) 91/50     Girls Systolic BP Percentile --      Girls Diastolic BP Percentile --      Boys Systolic BP Percentile --      Boys Diastolic BP Percentile --      Pulse Rate 06/13/24 1723 85     Resp 06/13/24 1723 20     Temp 06/13/24 1723 98.4 F (36.9 C)     Temp Source 06/13/24 1723 Oral     SpO2 06/13/24 1717 98 %     Weight 06/13/24 1725 250 lb (113.4 kg)     Height 06/13/24 1725 5' 2 (1.575 m)     Head Circumference --      [facility] Flow --      Pain Score 06/13/24 1725 0     Pain Loc --      Pain Education --      Exclude from Growth Chart --     Most recent vital signs: Vitals:   06/13/24 1808 06/13/24 1827  BP:    Pulse:    Resp:    Temp:    SpO2: 100% 95%     General: Awake, no distress.  CV:  Good peripheral perfusion.  Regular rate rhythm Resp:  Normal effort.  Diminished breath sounds on the left.  No wheezing Abd:  No distention.  Soft nontender Other:  Fresh coagulated blood in the left naris.  No septal hematoma.  No active bleeding.  No intraoral blood or blood in the oropharynx.  Head is atraumatic.   ED Results / Procedures / Treatments   Labs (all labs ordered are listed, but only abnormal results are displayed) Labs Reviewed  BASIC METABOLIC PANEL WITH GFR - Abnormal; Notable for the following components:      Result Value   Sodium 133 (*)    Chloride 93 (*)    Glucose, Bld 125 (*)    Creatinine, Ser 1.04 (*)    Calcium  8.3 (*)    GFR, Estimated 59 (*)    All other components within normal limits  CBC WITH DIFFERENTIAL/PLATELET - Abnormal; Notable for the following components:   WBC 11.4 (*)    RBC 2.46 (*)    Hemoglobin 7.4 (*)    HCT 24.3 (*)    RDW 16.5 (*)    Platelets 144 (*)    Neutro Abs 9.9 (*)    Lymphs Abs 0.4 (*)    Abs Immature Granulocytes 0.26 (*)    All other components within normal limits     EKG Interpreted by me Sinus rhythm rate of 79.  Normal axis, normal intervals.  Normal QRS ST segments and T waves.  No ischemic changes   RADIOLOGY CT head interpreted by me, no intracranial hemorrhage.  Radiology report reviewed, noting maxillary sinusitis and mastoid opacification.   PROCEDURES:  Procedures   MEDICATIONS ORDERED IN ED: Medications  levofloxacin  (LEVAQUIN ) tablet 750 mg (750 mg Oral Given 06/13/24 1954)     IMPRESSION / MDM / ASSESSMENT AND PLAN / ED COURSE  I reviewed the triage vital signs and the nursing notes.  DDx: Intracranial hemorrhage, pleural effusion, pneumonia, pulmonary edema, electrolyte derangement, anemia  Patient's presentation is most consistent with acute presentation with potential threat to life or bodily function.  Patient presents with mechanical  fall.  No acute symptoms.  Was found to be hypoxic at her facility initially, which I think is due to occlusion of  her nose from coagulated blood preventing nasal cannula oxygen from penetrating.  Will check labs chest x-ray CT head.   ----------------------------------------- 8:11 PM on 06/13/2024 ----------------------------------------- Radiology reassuring, does reveal some evidence of chronic sinusitis, will start Levaquin .  Oxygen saturation is 94% on her chronic 6 L nasal cannula, she feels well and eager for discharge.  No mastoid tenderness      FINAL CLINICAL IMPRESSION(S) / ED DIAGNOSES   Final diagnoses:  Chronic respiratory failure with hypoxia (HCC)  Chronic maxillary sinusitis     Rx / DC Orders   ED Discharge Orders          Ordered    levofloxacin  (LEVAQUIN ) 750 MG tablet  Daily        06/13/24 1952             Note:  This document was prepared using Dragon voice recognition software and may include unintentional dictation errors.   Viviann Pastor, MD 06/13/24 2012

## 2024-06-13 NOTE — ED Notes (Signed)
 [facility] called for transport to [facility] , spoke with Delon

## 2024-06-13 NOTE — ED Triage Notes (Signed)
 Patient to ED via [facility] from [facility]. Patient had an unwitnessed mechanical fall in her bathroom. Patient states she did hit her head but did not have LOC. Upon EMS arrival, patient did have a bloody nose, which is controlled at this time. Patient wears 6L [place] at baseline but was 75% with EMS. EMS placed patient on 15L non-rebreather and patient's sats increased to 98%. Patient does currently take blood thinners. Patient A&Ox4 at this time.

## 2024-06-14 ENCOUNTER — Inpatient Hospital Stay
Admission: EM | Admit: 2024-06-14 | Discharge: 2024-07-01 | DRG: 189 | Disposition: A | Discharge status: Skilled Nursing Facility | Source: Skilled Nursing Facility | Attending: Internal Medicine | Admitting: Internal Medicine

## 2024-06-14 ENCOUNTER — Encounter: Payer: Self-pay | Admitting: Internal Medicine

## 2024-06-14 ENCOUNTER — Emergency Department

## 2024-06-14 ENCOUNTER — Inpatient Hospital Stay

## 2024-06-14 DIAGNOSIS — Z7901 Long term (current) use of anticoagulants: Secondary | ICD-10-CM

## 2024-06-14 DIAGNOSIS — S8012XA Contusion of left lower leg, initial encounter: Secondary | ICD-10-CM | POA: Diagnosis not present

## 2024-06-14 DIAGNOSIS — E11649 Type 2 diabetes mellitus with hypoglycemia without coma: Secondary | ICD-10-CM | POA: Diagnosis present

## 2024-06-14 DIAGNOSIS — Z515 Encounter for palliative care: Secondary | ICD-10-CM | POA: Diagnosis not present

## 2024-06-14 DIAGNOSIS — J9601 Acute respiratory failure with hypoxia: Secondary | ICD-10-CM | POA: Diagnosis present

## 2024-06-14 DIAGNOSIS — G4733 Obstructive sleep apnea (adult) (pediatric): Secondary | ICD-10-CM | POA: Diagnosis not present

## 2024-06-14 DIAGNOSIS — M069 Rheumatoid arthritis, unspecified: Secondary | ICD-10-CM | POA: Diagnosis present

## 2024-06-14 DIAGNOSIS — J441 Chronic obstructive pulmonary disease with (acute) exacerbation: Secondary | ICD-10-CM | POA: Diagnosis present

## 2024-06-14 DIAGNOSIS — D649 Anemia, unspecified: Secondary | ICD-10-CM

## 2024-06-14 DIAGNOSIS — J9691 Respiratory failure, unspecified with hypoxia: Secondary | ICD-10-CM | POA: Diagnosis not present

## 2024-06-14 DIAGNOSIS — J849 Interstitial pulmonary disease, unspecified: Secondary | ICD-10-CM | POA: Diagnosis present

## 2024-06-14 DIAGNOSIS — J9621 Acute and chronic respiratory failure with hypoxia: Secondary | ICD-10-CM | POA: Diagnosis present

## 2024-06-14 DIAGNOSIS — I110 Hypertensive heart disease with heart failure: Secondary | ICD-10-CM | POA: Diagnosis present

## 2024-06-14 DIAGNOSIS — Z87891 Personal history of nicotine dependence: Secondary | ICD-10-CM

## 2024-06-14 DIAGNOSIS — E7800 Pure hypercholesterolemia, unspecified: Secondary | ICD-10-CM | POA: Diagnosis present

## 2024-06-14 DIAGNOSIS — E876 Hypokalemia: Secondary | ICD-10-CM | POA: Diagnosis not present

## 2024-06-14 DIAGNOSIS — Z794 Long term (current) use of insulin: Secondary | ICD-10-CM | POA: Diagnosis not present

## 2024-06-14 DIAGNOSIS — S81802A Unspecified open wound, left lower leg, initial encounter: Secondary | ICD-10-CM | POA: Diagnosis present

## 2024-06-14 DIAGNOSIS — W06XXXA Fall from bed, initial encounter: Secondary | ICD-10-CM | POA: Diagnosis present

## 2024-06-14 DIAGNOSIS — E119 Type 2 diabetes mellitus without complications: Secondary | ICD-10-CM | POA: Diagnosis present

## 2024-06-14 DIAGNOSIS — I272 Pulmonary hypertension, unspecified: Secondary | ICD-10-CM | POA: Diagnosis not present

## 2024-06-14 DIAGNOSIS — S81812A Laceration without foreign body, left lower leg, initial encounter: Secondary | ICD-10-CM | POA: Diagnosis present

## 2024-06-14 DIAGNOSIS — Z993 Dependence on wheelchair: Secondary | ICD-10-CM

## 2024-06-14 DIAGNOSIS — Z823 Family history of stroke: Secondary | ICD-10-CM

## 2024-06-14 DIAGNOSIS — I340 Nonrheumatic mitral (valve) insufficiency: Secondary | ICD-10-CM | POA: Diagnosis present

## 2024-06-14 DIAGNOSIS — J9622 Acute and chronic respiratory failure with hypercapnia: Principal | ICD-10-CM | POA: Diagnosis present

## 2024-06-14 DIAGNOSIS — Z6841 Body Mass Index (BMI) 40.0 and over, adult: Secondary | ICD-10-CM | POA: Diagnosis not present

## 2024-06-14 DIAGNOSIS — I4510 Unspecified right bundle-branch block: Secondary | ICD-10-CM | POA: Diagnosis present

## 2024-06-14 DIAGNOSIS — E039 Hypothyroidism, unspecified: Secondary | ICD-10-CM | POA: Diagnosis present

## 2024-06-14 DIAGNOSIS — I480 Paroxysmal atrial fibrillation: Secondary | ICD-10-CM | POA: Diagnosis present

## 2024-06-14 DIAGNOSIS — I5033 Acute on chronic diastolic (congestive) heart failure: Secondary | ICD-10-CM | POA: Diagnosis present

## 2024-06-14 DIAGNOSIS — I5031 Acute diastolic (congestive) heart failure: Secondary | ICD-10-CM | POA: Diagnosis not present

## 2024-06-14 DIAGNOSIS — I509 Heart failure, unspecified: Secondary | ICD-10-CM | POA: Diagnosis present

## 2024-06-14 DIAGNOSIS — J9612 Chronic respiratory failure with hypercapnia: Secondary | ICD-10-CM | POA: Diagnosis not present

## 2024-06-14 DIAGNOSIS — K219 Gastro-esophageal reflux disease without esophagitis: Secondary | ICD-10-CM | POA: Diagnosis present

## 2024-06-14 DIAGNOSIS — I4892 Unspecified atrial flutter: Secondary | ICD-10-CM | POA: Diagnosis present

## 2024-06-14 DIAGNOSIS — E8729 Other acidosis: Secondary | ICD-10-CM | POA: Diagnosis present

## 2024-06-14 DIAGNOSIS — Z9981 Dependence on supplemental oxygen: Secondary | ICD-10-CM | POA: Diagnosis not present

## 2024-06-14 DIAGNOSIS — Z86718 Personal history of other venous thrombosis and embolism: Secondary | ICD-10-CM

## 2024-06-14 DIAGNOSIS — Z79890 Hormone replacement therapy (postmenopausal): Secondary | ICD-10-CM

## 2024-06-14 DIAGNOSIS — D638 Anemia in other chronic diseases classified elsewhere: Secondary | ICD-10-CM

## 2024-06-14 DIAGNOSIS — D62 Acute posthemorrhagic anemia: Secondary | ICD-10-CM | POA: Diagnosis present

## 2024-06-14 DIAGNOSIS — J320 Chronic maxillary sinusitis: Secondary | ICD-10-CM | POA: Diagnosis present

## 2024-06-14 DIAGNOSIS — I872 Venous insufficiency (chronic) (peripheral): Secondary | ICD-10-CM | POA: Diagnosis present

## 2024-06-14 DIAGNOSIS — S81812S Laceration without foreign body, left lower leg, sequela: Secondary | ICD-10-CM | POA: Diagnosis not present

## 2024-06-14 DIAGNOSIS — Z8249 Family history of ischemic heart disease and other diseases of the circulatory system: Secondary | ICD-10-CM

## 2024-06-14 DIAGNOSIS — Z7189 Other specified counseling: Secondary | ICD-10-CM | POA: Diagnosis not present

## 2024-06-14 DIAGNOSIS — I2723 Pulmonary hypertension due to lung diseases and hypoxia: Secondary | ICD-10-CM | POA: Diagnosis present

## 2024-06-14 DIAGNOSIS — Z86711 Personal history of pulmonary embolism: Secondary | ICD-10-CM

## 2024-06-14 DIAGNOSIS — E662 Morbid (severe) obesity with alveolar hypoventilation: Secondary | ICD-10-CM | POA: Diagnosis present

## 2024-06-14 DIAGNOSIS — R55 Syncope and collapse: Principal | ICD-10-CM

## 2024-06-14 DIAGNOSIS — Z7952 Long term (current) use of systemic steroids: Secondary | ICD-10-CM

## 2024-06-14 DIAGNOSIS — J439 Emphysema, unspecified: Secondary | ICD-10-CM | POA: Diagnosis present

## 2024-06-14 DIAGNOSIS — I2722 Pulmonary hypertension due to left heart disease: Secondary | ICD-10-CM | POA: Diagnosis present

## 2024-06-14 DIAGNOSIS — W010XXA Fall on same level from slipping, tripping and stumbling without subsequent striking against object, initial encounter: Secondary | ICD-10-CM | POA: Diagnosis present

## 2024-06-14 DIAGNOSIS — E063 Autoimmune thyroiditis: Secondary | ICD-10-CM | POA: Diagnosis present

## 2024-06-14 DIAGNOSIS — I4891 Unspecified atrial fibrillation: Secondary | ICD-10-CM | POA: Diagnosis present

## 2024-06-14 DIAGNOSIS — E66813 Obesity, class 3: Secondary | ICD-10-CM | POA: Diagnosis present

## 2024-06-14 DIAGNOSIS — Z79899 Other long term (current) drug therapy: Secondary | ICD-10-CM

## 2024-06-14 DIAGNOSIS — I4819 Other persistent atrial fibrillation: Secondary | ICD-10-CM | POA: Diagnosis not present

## 2024-06-14 DIAGNOSIS — R296 Repeated falls: Secondary | ICD-10-CM | POA: Diagnosis present

## 2024-06-14 DIAGNOSIS — Z7951 Long term (current) use of inhaled steroids: Secondary | ICD-10-CM

## 2024-06-14 DIAGNOSIS — W19XXXA Unspecified fall, initial encounter: Secondary | ICD-10-CM | POA: Diagnosis present

## 2024-06-14 DIAGNOSIS — Z7401 Bed confinement status: Secondary | ICD-10-CM

## 2024-06-14 LAB — BRAIN NATRIURETIC PEPTIDE: B Natriuretic Peptide: 163.5 pg/mL — ABNORMAL HIGH (ref 0.0–100.0)

## 2024-06-14 LAB — BLOOD GAS, VENOUS
Acid-Base Excess: 3.8 mmol/L — ABNORMAL HIGH (ref 0.0–2.0)
Bicarbonate: 34.4 mmol/L — ABNORMAL HIGH (ref 20.0–28.0)
O2 Saturation: 44.6 %
Patient temperature: 37
Patient temperature: 37
pCO2, Ven: 84 mmHg (ref 44–60)
pH, Ven: 7.22 — ABNORMAL LOW (ref 7.25–7.43)
pO2, Ven: 37 mmHg (ref 32–45)

## 2024-06-14 LAB — CBC
HCT: 22.3 % — ABNORMAL LOW (ref 36.0–46.0)
Hemoglobin: 6.5 g/dL — ABNORMAL LOW (ref 12.0–15.0)
MCH: 29.3 pg (ref 26.0–34.0)
MCHC: 29.1 g/dL — ABNORMAL LOW (ref 30.0–36.0)
MCV: 100.5 fL — ABNORMAL HIGH (ref 80.0–100.0)
Platelets: 144 10*3/uL — ABNORMAL LOW (ref 150–400)
RBC: 2.22 MIL/uL — ABNORMAL LOW (ref 3.87–5.11)
RDW: 16.9 % — ABNORMAL HIGH (ref 11.5–15.5)
WBC: 11.4 10*3/uL — ABNORMAL HIGH (ref 4.0–10.5)
nRBC: 0.2 % (ref 0.0–0.2)

## 2024-06-14 LAB — CBG MONITORING, ED
Glucose-Capillary: 101 mg/dL — ABNORMAL HIGH (ref 70–99)
Glucose-Capillary: 63 mg/dL — ABNORMAL LOW (ref 70–99)
Glucose-Capillary: 81 mg/dL (ref 70–99)

## 2024-06-14 LAB — PREPARE RBC (CROSSMATCH)

## 2024-06-14 LAB — HEMOGLOBIN AND HEMATOCRIT, BLOOD
HCT: 20.7 % — ABNORMAL LOW (ref 36.0–46.0)
Hemoglobin: 6.4 g/dL — ABNORMAL LOW (ref 12.0–15.0)

## 2024-06-14 LAB — COMPREHENSIVE METABOLIC PANEL WITH GFR
ALT: 16 U/L (ref 0–44)
AST: 17 U/L (ref 15–41)
Albumin: 2.6 g/dL — ABNORMAL LOW (ref 3.5–5.0)
Alkaline Phosphatase: 53 U/L (ref 38–126)
Anion gap: 15 (ref 5–15)
BUN: 21 mg/dL (ref 8–23)
CO2: 25 mmol/L (ref 22–32)
Calcium: 7.9 mg/dL — ABNORMAL LOW (ref 8.9–10.3)
Chloride: 96 mmol/L — ABNORMAL LOW (ref 98–111)
Creatinine, Ser: 1.11 mg/dL — ABNORMAL HIGH (ref 0.44–1.00)
GFR, Estimated: 55 mL/min — ABNORMAL LOW (ref >60)
Glucose, Bld: 103 mg/dL — ABNORMAL HIGH (ref 70–99)
Potassium: 3.5 mmol/L (ref 3.5–5.1)
Sodium: 136 mmol/L (ref 135–145)
Total Bilirubin: 0.8 mg/dL (ref 0.0–1.2)
Total Protein: 5.7 g/dL — ABNORMAL LOW (ref 6.5–8.1)

## 2024-06-14 LAB — ABO/RH: ABO/RH(D): O POS

## 2024-06-14 LAB — TROPONIN I (HIGH SENSITIVITY): Troponin I (High Sensitivity): 6 ng/L (ref <18)

## 2024-06-14 MED ORDER — SERTRALINE HCL 50 MG PO TABS
75.0000 mg | ORAL_TABLET | Freq: Every day | ORAL | Status: DC
  Administered 2024-06-14 – 2024-07-01 (×18): 75 mg via ORAL
  Filled 2024-06-14 (×18): qty 2

## 2024-06-14 MED ORDER — SERTRALINE HCL 50 MG PO TABS: 50.0000 mg | ORAL_TABLET | Freq: Every day | ORAL | Status: DC

## 2024-06-14 MED ORDER — GABAPENTIN 400 MG PO CAPS
400.0000 mg | ORAL_CAPSULE | Freq: Three times a day (TID) | ORAL | Status: DC
  Administered 2024-06-14 – 2024-07-01 (×51): 400 mg via ORAL
  Filled 2024-06-14 (×52): qty 1

## 2024-06-14 MED ORDER — MONTELUKAST SODIUM 10 MG PO TABS
10.0000 mg | ORAL_TABLET | Freq: Every day | ORAL | Status: DC
  Administered 2024-06-14 – 2024-07-01 (×18): 10 mg via ORAL
  Filled 2024-06-14 (×18): qty 1

## 2024-06-14 MED ORDER — IPRATROPIUM-ALBUTEROL 0.5-2.5 (3) MG/3ML IN SOLN
3.0000 mL | Freq: Four times a day (QID) | RESPIRATORY_TRACT | Status: DC
  Administered 2024-06-14 – 2024-06-21 (×29): 3 mL via RESPIRATORY_TRACT
  Filled 2024-06-14 (×29): qty 3

## 2024-06-14 MED ORDER — VITAMIN B-12 1000 MCG PO TABS
1000.0000 ug | ORAL_TABLET | Freq: Every day | ORAL | Status: DC
  Administered 2024-06-15 – 2024-07-01 (×17): 1000 ug via ORAL
  Filled 2024-06-14 (×20): qty 1

## 2024-06-14 MED ORDER — AMIODARONE HCL 200 MG PO TABS
200.0000 mg | ORAL_TABLET | Freq: Every day | ORAL | Status: DC
  Administered 2024-06-14 – 2024-06-22 (×9): 200 mg via ORAL
  Filled 2024-06-14 (×9): qty 1

## 2024-06-14 MED ORDER — TRAMADOL HCL 50 MG PO TABS
25.0000 mg | ORAL_TABLET | Freq: Every day | ORAL | Status: DC | PRN
  Administered 2024-06-15 – 2024-06-16 (×2): 25 mg via ORAL
  Filled 2024-06-14 (×2): qty 1

## 2024-06-14 MED ORDER — ONDANSETRON HCL 4 MG PO TABS: 4.0000 mg | ORAL_TABLET | Freq: Four times a day (QID) | ORAL | Status: DC | PRN

## 2024-06-14 MED ORDER — ONDANSETRON HCL 4 MG/2ML IJ SOLN
4.0000 mg | Freq: Four times a day (QID) | INTRAMUSCULAR | Status: DC | PRN
  Filled 2024-06-14: qty 2

## 2024-06-14 MED ORDER — PREDNISONE 10 MG PO TABS
5.0000 mg | ORAL_TABLET | Freq: Every day | ORAL | Status: DC
  Administered 2024-06-15 – 2024-06-18 (×4): 5 mg via ORAL
  Filled 2024-06-14 (×4): qty 1

## 2024-06-14 MED ORDER — MELATONIN 5 MG PO TABS
5.0000 mg | ORAL_TABLET | Freq: Every day | ORAL | Status: DC
  Administered 2024-06-14 – 2024-06-30 (×17): 5 mg via ORAL
  Filled 2024-06-14 (×17): qty 1

## 2024-06-14 MED ORDER — FOLIC ACID 1 MG PO TABS
1.0000 mg | ORAL_TABLET | Freq: Every day | ORAL | Status: DC
  Administered 2024-06-14 – 2024-07-01 (×18): 1 mg via ORAL
  Filled 2024-06-14 (×19): qty 1

## 2024-06-14 MED ORDER — SENNOSIDES-DOCUSATE SODIUM 8.6-50 MG PO TABS
2.0000 | ORAL_TABLET | Freq: Two times a day (BID) | ORAL | Status: DC
  Administered 2024-06-14 – 2024-07-01 (×27): 2 via ORAL
  Filled 2024-06-14 (×31): qty 2

## 2024-06-14 MED ORDER — ACETAMINOPHEN 500 MG PO TABS
500.0000 mg | ORAL_TABLET | Freq: Three times a day (TID) | ORAL | Status: DC | PRN
  Administered 2024-06-14: 500 mg via ORAL
  Filled 2024-06-14: qty 1

## 2024-06-14 MED ORDER — DILTIAZEM HCL ER COATED BEADS 120 MG PO CP24
240.0000 mg | ORAL_CAPSULE | Freq: Every day | ORAL | Status: DC
  Administered 2024-06-16 – 2024-06-18 (×3): 240 mg via ORAL
  Filled 2024-06-14 (×4): qty 2

## 2024-06-14 MED ORDER — BUDESONIDE 0.5 MG/2ML IN SUSP
0.5000 mg | Freq: Two times a day (BID) | RESPIRATORY_TRACT | Status: DC
  Administered 2024-06-14 – 2024-07-01 (×34): 0.5 mg via RESPIRATORY_TRACT
  Filled 2024-06-14 (×34): qty 2

## 2024-06-14 MED ORDER — LEVOTHYROXINE SODIUM 50 MCG PO TABS
150.0000 ug | ORAL_TABLET | Freq: Every day | ORAL | Status: DC
  Administered 2024-06-15 – 2024-07-01 (×17): 150 ug via ORAL
  Filled 2024-06-14 (×17): qty 1

## 2024-06-14 MED ORDER — ATORVASTATIN CALCIUM 10 MG PO TABS
10.0000 mg | ORAL_TABLET | Freq: Every day | ORAL | Status: DC
  Administered 2024-06-14 – 2024-06-30 (×17): 10 mg via ORAL
  Filled 2024-06-14 (×17): qty 1

## 2024-06-14 MED ORDER — FUROSEMIDE 10 MG/ML IJ SOLN
40.0000 mg | Freq: Once | INTRAMUSCULAR | Status: AC
  Administered 2024-06-14: 40 mg via INTRAVENOUS
  Filled 2024-06-14: qty 4

## 2024-06-14 MED ORDER — INSULIN ASPART 100 UNIT/ML IJ SOLN: 0.0000 [IU] | Freq: Three times a day (TID) | INTRAMUSCULAR | Status: DC

## 2024-06-14 MED ORDER — IOHEXOL 300 MG/ML  SOLN
75.0000 mL | Freq: Once | INTRAMUSCULAR | Status: AC | PRN
  Administered 2024-06-14: 75 mL via INTRAVENOUS

## 2024-06-14 MED ORDER — FERROUS SULFATE 325 (65 FE) MG PO TABS
325.0000 mg | ORAL_TABLET | Freq: Every day | ORAL | Status: DC
  Administered 2024-06-15 – 2024-06-16 (×2): 325 mg via ORAL
  Filled 2024-06-14 (×2): qty 1

## 2024-06-14 MED ORDER — DEXTROSE 50 % IV SOLN
12.5000 g | INTRAVENOUS | Status: AC
  Administered 2024-06-14: 12.5 g via INTRAVENOUS
  Filled 2024-06-14: qty 50

## 2024-06-14 MED ORDER — LIDOCAINE-EPINEPHRINE 2 %-1:100000 IJ SOLN
20.0000 mL | Freq: Once | INTRAMUSCULAR | Status: AC
  Administered 2024-06-14: 20 mL
  Filled 2024-06-14: qty 1

## 2024-06-14 MED ORDER — HYDROXYCHLOROQUINE SULFATE 200 MG PO TABS
200.0000 mg | ORAL_TABLET | Freq: Every day | ORAL | Status: DC
  Administered 2024-06-15 – 2024-07-01 (×17): 200 mg via ORAL
  Filled 2024-06-14 (×18): qty 1

## 2024-06-14 MED ORDER — FUROSEMIDE 10 MG/ML IJ SOLN
40.0000 mg | Freq: Two times a day (BID) | INTRAMUSCULAR | Status: DC
  Administered 2024-06-14 – 2024-06-18 (×7): 40 mg via INTRAVENOUS
  Filled 2024-06-14 (×8): qty 4

## 2024-06-14 MED ORDER — PANTOPRAZOLE SODIUM 40 MG PO TBEC
40.0000 mg | DELAYED_RELEASE_TABLET | Freq: Every day | ORAL | Status: DC
  Administered 2024-06-15 – 2024-07-01 (×17): 40 mg via ORAL
  Filled 2024-06-14 (×17): qty 1

## 2024-06-14 NOTE — Assessment & Plan Note (Signed)
 Last known LVEF of 50 to 55% from a 2D echocardiogram which was done 06/25 Imaging is concerning for persistent mixed interstitial and airspace opacification and BNP is elevated Will place patient on Lasix  40 mg IV every 12 Follow-up results of CT scan of the chest without contrast for further evaluation

## 2024-06-14 NOTE — Assessment & Plan Note (Signed)
Treatment as outlined in 3 

## 2024-06-14 NOTE — ED Notes (Signed)
 Pt placed on her baseline Impact.

## 2024-06-14 NOTE — Assessment & Plan Note (Signed)
 Continue prednisone  and hydroxychloroquine 

## 2024-06-14 NOTE — Assessment & Plan Note (Signed)
 Place patient on fall precautions PT evaluation when appropriate

## 2024-06-14 NOTE — Assessment & Plan Note (Signed)
 Patient with a history of chronic hypoxic hypercapnic respiratory failure found with decreased responsiveness on room air pulse oximetry in the 60s VBG showed uncompensated respiratory acidosis Patient is currently on BiPAP Will repeat VBG in 2 hours and transition patient to her baseline home O2 at 6 L continuous Will continue BiPAP at night

## 2024-06-14 NOTE — ED Notes (Signed)
 Informed RN of assigned bed

## 2024-06-14 NOTE — Assessment & Plan Note (Signed)
 Secondary to left lower extremity laceration following a fall in the setting of chronic anticoagulant use and varicose veins Noted to have a hemoglobin of 6.5 decreased from her baseline Will transfuse 1 unit of packed RBC Will check serial H&H and transfuse as needed

## 2024-06-14 NOTE — Assessment & Plan Note (Signed)
 Continue Synthroid

## 2024-06-14 NOTE — ED Provider Notes (Signed)
 [facility] Provider Note    Event Date/Time   First MD Initiated Contact with Patient 06/14/24 [phone]     (approximate)   History   Chief Complaint Fall   HPI  [name] is a 68 y.o. female with past medical history of hypertension, hyperlipidemia, diabetes, atrial fibrillation, CHF, COPD, chronic hypoxic respiratory failure on 6 L, PE on Xarelto , and rheumatoid arthritis who presents to the ED complaining of fall.  Patient reports that she was trying to remove her BiPAP this morning and transition to her usual nasal cannula, does not remember what happened next.  She was found by staff at her nursing facility facedown on the floor next to her bed.  She reportedly received a couple of chest compressions before waking up.  She complains of pain to her left lower leg with large laceration in this area, is not sure whether she hit her head.  She denies any other areas of pain, does state that she is feeling more short of breath than usual but denies any pain in her chest.     Physical Exam   Triage Vital Signs: ED Triage Vitals [06/14/24 0938]  Encounter Vitals Group     BP      Girls Systolic BP Percentile      Girls Diastolic BP Percentile      Boys Systolic BP Percentile      Boys Diastolic BP Percentile      Pulse      Resp      Temp      Temp src      SpO2      Weight 250 lb (113.4 kg)     Height 5' 2 (1.575 m)     Head Circumference      Peak Flow      Pain Score      Pain Loc      Pain Education      Exclude from Growth Chart     Most recent vital signs: Vitals:   06/14/24 1253 06/14/24 1309  BP: (!) 96/56 96/83  Pulse: 81 78  Resp: 18 (!) 26  Temp: 98 F (36.7 C) 98.1 F (36.7 C)  SpO2: 98% 100%    Constitutional: Alert and oriented. Eyes: Conjunctivae are normal. Head: Atraumatic. Nose: No congestion/rhinnorhea. Mouth/Throat: Mucous membranes are moist.  Neck: No midline cervical spine tenderness to  palpation. Cardiovascular: Normal rate, regular rhythm. Grossly normal heart sounds.  2+ radial and DP pulses bilaterally. Respiratory: Tachypneic with increased work of breathing, crackles noted throughout.  No chest wall tenderness to palpation. Gastrointestinal: Soft and nontender. No distention. Musculoskeletal: Diffuse tenderness to palpation of left knee and left ankle with large laceration to left shin.  1+ pitting edema noted to knees bilaterally. Neurologic:  Normal speech and language. No gross focal neurologic deficits are appreciated.    ED Results / Procedures / Treatments   Labs (all labs ordered are listed, but only abnormal results are displayed) Labs Reviewed  CBC - Abnormal; Notable for the following components:      Result Value   WBC 11.4 (*)    RBC 2.22 (*)    Hemoglobin 6.5 (*)    HCT 22.3 (*)    MCV 100.5 (*)    MCHC 29.1 (*)    RDW 16.9 (*)    Platelets 144 (*)    All other components within normal limits  BLOOD GAS, VENOUS - Abnormal; Notable for the following components:  pH, Ven 7.22 (*)    pCO2, Ven 84 (*)    Bicarbonate 34.4 (*)    Acid-Base Excess 3.8 (*)    All other components within normal limits  COMPREHENSIVE METABOLIC PANEL WITH GFR - Abnormal; Notable for the following components:   Chloride 96 (*)    Glucose, Bld 103 (*)    Creatinine, Ser 1.11 (*)    Calcium  7.9 (*)    Total Protein 5.7 (*)    Albumin 2.6 (*)    GFR, Estimated 55 (*)    All other components within normal limits  BRAIN NATRIURETIC PEPTIDE - Abnormal; Notable for the following components:   B Natriuretic Peptide 163.5 (*)    All other components within normal limits  TYPE AND SCREEN  PREPARE RBC (CROSSMATCH)  ABO/RH  TROPONIN I (HIGH SENSITIVITY)     EKG  ED ECG REPORT I, Carlin Palin, the attending physician, personally viewed and interpreted this ECG.   Date: 06/14/2024  EKG Time: 9:52  Rate: 76  Rhythm: normal sinus rhythm  Axis: Normal   Intervals:right bundle branch block  ST&T Change: None  RADIOLOGY CT head reviewed and interpreted by me with no hemorrhage or midline shift.  PROCEDURES:  Critical Care performed: Yes, see critical care procedure note(s)  .Critical Care  Performed by: Palin Carlin, MD Authorized by: Palin Carlin, MD   Critical care provider statement:    Critical care time (minutes):  30   Critical care time was exclusive of:  Separately billable procedures and treating other patients and teaching time   Critical care was necessary to treat or prevent imminent or life-threatening deterioration of the following conditions:  Respiratory failure (Anemia)   Critical care was time spent personally by me on the following activities:  Development of treatment plan with patient or surrogate, discussions with consultants, evaluation of patient's response to treatment, examination of patient, ordering and review of laboratory studies, ordering and review of radiographic studies, ordering and performing treatments and interventions, pulse oximetry, re-evaluation of patient's condition and review of old charts   I assumed direction of critical care for this patient from another provider in my specialty: no     Care discussed with: admitting provider   .Laceration Repair  Date/Time: 06/14/2024 12:15 PM  Performed by: Palin Carlin, MD Authorized by: Palin Carlin, MD   Consent:    Consent obtained:  Verbal   Consent given by:  Patient   Risks, benefits, and alternatives were discussed: yes   Universal protocol:    Patient identity confirmed:  Verbally with patient and arm band Anesthesia:    Anesthesia method:  Local infiltration   Local anesthetic:  Lidocaine  2% WITH epi Laceration details:    Location:  Leg   Leg location:  L lower leg   Length (cm):  10 Pre-procedure details:    Preparation:  Patient was prepped and draped in usual sterile fashion and imaging obtained to evaluate for foreign  bodies Exploration:    Limited defect created (wound extended): no     Hemostasis achieved with:  Direct pressure and epinephrine    Imaging obtained: x-ray     Imaging outcome: foreign body not noted     Wound exploration: wound explored through full range of motion and entire depth of wound visualized     Wound extent: areolar tissue not violated, fascia not violated, no foreign body, no signs of injury, no nerve damage, no tendon damage, no underlying fracture and no vascular damage  Contaminated: no   Treatment:    Area cleansed with:  Saline   Amount of cleaning:  Standard   Irrigation solution:  Sterile water   Irrigation method:  Pressure wash   Debridement:  None   Undermining:  None   Scar revision: no   Skin repair:    Repair method:  Sutures   Suture size:  3-0   Suture material:  Nylon   Suture technique:  Simple interrupted   Number of sutures:  10 Approximation:    Approximation:  Loose Repair type:    Repair type:  Simple Post-procedure details:    Dressing:  Non-adherent dressing   Procedure completion:  Tolerated well, no immediate complications    MEDICATIONS ORDERED IN ED: Medications  lidocaine -EPINEPHrine  (XYLOCAINE  W/EPI) 2 %-1:100000 (with pres) injection 20 mL (20 mLs Infiltration Given by Other 06/14/24 0955)  furosemide  (LASIX ) injection 40 mg (40 mg Intravenous Given 06/14/24 1108)     IMPRESSION / MDM / ASSESSMENT AND PLAN / ED COURSE  I reviewed the triage vital signs and the nursing notes.                              68 y.o. female with a past medical history of hypertension, hyperlipidemia, diabetes, atrial fibrillation, CHF, COPD on 6 L, PE on Xarelto , and RA who presents to the ED complaining of fall out of bed while attempting to transition from BiPAP to nasal cannula, reportedly had loss of consciousness.  Patient's presentation is most consistent with acute presentation with potential threat to life or bodily  function.  Differential diagnosis includes, but is not limited to, ACS, arrhythmia, PE, CHF exacerbation, COPD exacerbation, anemia, electrolyte abnormality, AKI, traumatic injury.  Patient chronically ill-appearing with increased work of breathing, oxygen saturations around 92% on her usual 6 L.  We will transition to BiPAP to help with her work of breathing.  Plan to check CT head and cervical spine for traumatic injury as well as x-rays of her left knee and ankle.  Labs and chest x-ray are also pending at this time.  CT head and cervical spine are negative for acute process, chest x-ray concerning for pulmonary edema versus pneumonia.  I would favor pulmonary edema based on her presentation and will diurese with IV Lasix .  VBG shows hypercapnic respiratory failure with worsening acidosis from her baseline.  Renal function is stable compared to previous with no significant electrolyte abnormality, but patient does have worsening anemia from her baseline with hemoglobin of 6.5.  We will transfuse you 1 unit PRBCs, suspect anemia due to her recent intermittent nosebleeds as well as bleeding laceration to left shin.  Laceration was repaired with no ongoing bleeding.  Case discussed with hospitalist for admission, who requests CT chest without contrast, which I have ordered.  Hospitalist to follow-up CT chest results.      FINAL CLINICAL IMPRESSION(S) / ED DIAGNOSES   Final diagnoses:  Syncope, unspecified syncope type  Acute on chronic respiratory failure with hypercapnia (HCC)  Anemia, unspecified type  Laceration of left lower leg, initial encounter     Rx / DC Orders   ED Discharge Orders     None        Note:  This document was prepared using Dragon voice recognition software and may include unintentional dictation errors.   Willo Dunnings, MD 06/14/24 [phone]

## 2024-06-14 NOTE — Assessment & Plan Note (Signed)
 Will continue Cardizem  for rate control with holding parameters Hold Xarelto  due to acute blood loss anemia following left lower extremity laceration

## 2024-06-14 NOTE — H&P (Signed)
 Mild History and Physical    Patient: [name] FMW:[id number] DOB: 11-13-1956 DOA: 06/14/2024 DOS: the patient was seen and examined on 06/14/2024 PCP: [name] DASEN, PA-C  Patient coming from: SNF  Chief Complaint:  Chief Complaint  Patient presents with   Fall   Most of the history was obtained from ER notes.  HPI: [name] is a 68 y.o. female with medical history significant for morbid obesity with BMI of 45.73, history of atrial fibrillation status post cardioversion 05/25 on chronic anticoagulation therapy, history of DVT, chronic respiratory failure on 6 L via nasal cannula and BiPAP at night, history of hypothyroidism, rheumatoid arthritis, varicose veins involving her lower extremities, history of recurrent epistaxis who presents to the emergency room from the skilled nursing facility where she resides for evaluation of change in mental status. According to the patient she does not remember the events that preceded her fall.  She was found by the nursing home staff on the floor and hypoxic with room air pulse oximetry in the 60s.  They applied her 6 L via nasal cannula without any improvement in her pulse oximetry and so she was placed on 15 L nonrebreather mask.  Per ER notes, chest compression was started but patient became arousable and responsive and so was discontinued.  She was noted to have a large laceration on her left anterior leg which was sutured in the ER but still oozing. She is unable to tell me if she hit her head when she fell and complains of pain in her leg but has no other complaints. She denies having any headache, no chest pain, no shortness of breath, no fever, no chills, no cough, no abdominal pain, no blurred vision, no focal deficit. Abnormal labs include BNP 163.5, white count 11.2, hemoglobin 6.5 compared to baseline of 7.4, platelet count 144, pH 7.22, PCO284 Chest x-ray reviewed by me shows persistent mixed interstitial and airspace  opacification may be due to edema or pneumonia. CT scan of the head without contrast showed no acute intracranial abnormality. No skull fracture. Mild chronic small vessel ischemia. Mucosal thickening with high density within the right maxillary sinus, however no evidence of facial bone fracture. This may be related to chronic sinusitis, including fungal infection. Near complete opacification of left mastoid air cells, partial opacification of right mastoid air cells. Cervical spine CT shows moderate diffuse chronic degenerative disc disease and facet arthropathy without evidence of acute traumatic injury. Emphysema. Left ankle x-ray showed no acute osseous abnormality. Degenerative changes in the ankle joint. Left knee x-ray showed  no acute findings. Left knee osteoarthritis, worst in the medial compartment. Twelve-lead EKG reviewed by me shows sinus rhythm, short PR interval, T wave inversions in the lateral leads and right bundle branch block. She was transfused 1 unit of packed RBC in the emergency room.   Review of Systems: As mentioned in the history of present illness. All other systems reviewed and are negative. 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    HTN (hypertension)    Hypercholesteremia    Hypothyroid    Rheumatoid arthritis(714.0)    Varicose vein of leg    Past Surgical History:  Procedure Laterality Date   APPENDECTOMY  CHOLECYSTECTOMY     ELBOW SURGERY     LEFT   ENDOSCOPIC VEIN LASER TREATMENT     RIGHT LEG   EXPLORATORY LAPAROTOMY     HAND SURGERY     RIGHT    HEEL SPUR SURGERY     BILAT   INSERTION OF MESH N/A 08/04/2017   Procedure: INSERTION OF MESH;  Surgeon: Eletha Boas, MD;  Location: [facility] OR;  Service: General;  Laterality: N/A;   partial thyroidectomy  2015    TONSILLECTOMY     TUBAL LIGATION     VENTRAL HERNIA REPAIR N/A 08/04/2017   Procedure: LAPAROSCOPIC VENTRAL INCISIONAL HERNIA REPAIR WITH MESH;  Surgeon: Eletha Boas, MD;  Location: [facility] OR;  Service: General;  Laterality: N/A;   Social History:  reports that she quit smoking about 4 years ago. Her smoking use included cigarettes. She started smoking about 44 years ago. She has a 40 pack-year smoking history. She has never used smokeless tobacco. She reports that she does not drink alcohol and does not use drugs.  Allergies  Allergen Reactions   Latex Hives, Itching and Dermatitis    Blisters (also)  Skin blisters   Oxycodone -Acetaminophen  Nausea Only and Other (See Comments)    Violent Vomiting Violent Vomiting   Penicillins Anaphylaxis, Swelling and Rash    Has patient had a PCN reaction causing immediate rash, facial/tongue/throat swelling, SOB or lightheadedness with hypotension: Yes Has patient had a PCN reaction causing severe rash involving mucus membranes or skin necrosis: No Has patient had a PCN reaction that required hospitalization No Has patient had a PCN reaction occurring within the last 10 years: No If all of the above answers are NO, then may proceed with Cephalosporin use.    Strawberry Extract Hives, Itching, Rash, Anaphylaxis, Dermatitis and Swelling   Oxycodone -Acetaminophen  Nausea And Vomiting   Tyloxapol Nausea And Vomiting   Adhesive [Tape] Rash    Patient prefers paper tape   Wound Dressing Adhesive Dermatitis    Family History  Problem Relation Age of Onset   Diabetes Mother    Kidney disease Mother    Lung cancer Father    Alzheimer's disease Maternal Grandmother    Stroke Maternal Grandfather    Heart disease Paternal Grandmother    Unexplained death Paternal Grandfather     Prior to Admission medications   Medication Sig Start Date End Date Taking? Authorizing Provider  acetaminophen  (TYLENOL ) 500 MG tablet Take 500 mg by mouth every 8 (eight)  hours as needed for mild pain (pain score 1-3).   Yes [provider]  amiodarone  (PACERONE ) 200 MG tablet Take 200 mg by mouth daily.   Yes [provider]  atorvastatin  (LIPITOR) 10 MG tablet Take 10 mg by mouth at bedtime.   Yes [provider]  benzonatate (TESSALON) 100 MG capsule Take 100 mg by mouth every 8 (eight) hours as needed for cough.   Yes [provider]  budesonide  (PULMICORT ) 0.5 MG/2ML nebulizer solution Take 0.5 mg by nebulization 2 (two) times daily.   Yes [provider]  Cyanocobalamin 1000 MCG TBCR Take 1 tablet by mouth daily.   Yes [provider]  diltiazem  (CARDIZEM  CD) 240 MG 24 hr capsule Take 1 capsule (240 mg total) by mouth daily. 06/08/24  Yes Wouk, Devaughn Sayres, MD  ferrous sulfate  325 (65 FE) MG EC tablet Take 325 mg by mouth daily with breakfast.   Yes [provider]  folic acid  (FOLVITE ) 1 MG tablet Take 1 mg  by mouth daily.   Yes [provider]  furosemide  (LASIX ) 80 MG tablet Take 80 mg by mouth daily.   Yes [provider]  gabapentin  (NEURONTIN ) 400 MG capsule Take 400 mg by mouth 3 (three) times daily.   Yes [provider]  hydroxychloroquine  (PLAQUENIL ) 200 MG tablet Take 200 mg by mouth daily.   Yes [provider]  insulin  lispro (HUMALOG) 100 UNIT/ML injection Inject 2-10 Units into the skin 3 (three) times daily before meals. Sliding scale   Yes [provider]  ipratropium-albuterol  (DUONEB) 0.5-2.5 (3) MG/3ML SOLN Take 3 mLs by nebulization every 6 (six) hours.   Yes [provider]  levothyroxine  (SYNTHROID ) 150 MCG tablet Take 150 mcg by mouth daily before breakfast.   Yes [provider]  melatonin 5 MG TABS Take 5 mg by mouth at bedtime.   Yes [provider]  montelukast  (SINGULAIR ) 10 MG tablet Take 10 mg by mouth daily.   Yes [provider]  Multiple Vitamin (MULTIVITAMIN) tablet Take 1 tablet by  mouth daily.   Yes [provider]  naloxone (NARCAN) nasal spray 4 mg/0.1 mL Place 1 spray into the nose 3 (three) times daily as needed (symptoms of opiod overdose).   Yes [provider]  oxymetazoline  (AFRIN) 0.05 % nasal spray Place 1 spray into both nostrils 2 (two) times daily. 05/24/24  Yes Wouk, Devaughn Sayres, MD  pantoprazole  (PROTONIX ) 40 MG tablet Take 40 mg by mouth daily.   Yes [provider]  predniSONE  (DELTASONE ) 5 MG tablet Take 5 mg by mouth daily with breakfast.   Yes [provider]  rivaroxaban  (XARELTO ) 20 MG TABS tablet Take 20 mg by mouth daily.   Yes [provider]  sennosides-docusate sodium  (SENOKOT-S) 8.6-50 MG tablet Take 2 tablets by mouth 2 (two) times daily.   Yes [provider]  sertraline  (ZOLOFT ) 25 MG tablet Take 25 mg by mouth daily. Takes with 50mg  tab = 75mg  daily   Yes [provider]  sertraline  (ZOLOFT ) 50 MG tablet Take 50 mg by mouth daily. Takes with 25mg  tab = 75mg  daily   Yes [provider]  traMADol  HCl 25 MG TABS Take by mouth. PRN   Yes [provider]  levofloxacin  (LEVAQUIN ) 750 MG tablet Take 1 tablet (750 mg total) by mouth daily for 7 days. 06/13/24 06/20/24  Viviann Pastor, MD    Physical Exam: Vitals:   06/14/24 0945 06/14/24 0951 06/14/24 1253 06/14/24 1309  BP: (!) 129/114  (!) 96/56 96/83  Pulse: 81  81 78  Resp: 19  18 (!) 26  Temp:  98 F (36.7 C) 98 F (36.7 C) 98.1 F (36.7 C)  TempSrc:  Oral Axillary Axillary  SpO2: 96%  98% 100%  Weight:      Height:       Physical Exam Vitals and nursing note reviewed.  Constitutional:      Appearance: She is obese.     Comments: Patient is awake and alert. She is oriented to person and place. She is on Bipap  HENT:     Head: Normocephalic and atraumatic.     Nose: Nose normal.     Mouth/Throat:     Mouth: Mucous membranes are moist.   Eyes:     Comments: Pale conjunctiva   Cardiovascular:      Rate and Rhythm: Normal rate and regular rhythm.  Pulmonary:     Effort: Pulmonary effort is normal.  Breath sounds: Rales present.  Abdominal:     General: Abdomen is flat.     Palpations: Abdomen is soft.   Musculoskeletal:     Cervical back: Normal range of motion and neck supple.     Right lower leg: Edema present.     Left lower leg: Edema present.     Comments: Hematoma involving the left leg. Oozing from left leg wound Redness involving both lower extremities   Skin:    General: Skin is warm and dry.   Neurological:     Motor: Weakness present.   Psychiatric:        Mood and Affect: Mood normal.        Behavior: Behavior normal.     Data Reviewed: Relevant notes from primary care and specialist visits, past discharge summaries as available in EHR, including Care Everywhere. Prior diagnostic testing as pertinent to current admission diagnoses Updated medications and problem lists for reconciliation ED course, including vitals, labs, imaging, treatment and response to treatment Triage notes, nursing and pharmacy notes and ED provider's notes Notable results as noted in HPI Labs reviewed.  pH 7.2, PCO284, PO237, bicarb 34.4, white count 11.4, hemoglobin 6.5, hematocrit 32.3, platelet count 144, sodium 136, potassium 3.5, chloride 96, bicarb 25, glucose 103, BUN 21, creatinine 1.1, calcium  7.9 but corrects for low albumin of 2.6, BNP 163  There are no new results to review at this time.  Assessment and Plan: * Acute hypoxic on chronic hypercapnic respiratory failure (HCC) Patient with a history of chronic hypoxic hypercapnic respiratory failure found with decreased responsiveness on room air pulse oximetry in the 60s VBG showed uncompensated respiratory acidosis Patient is currently on BiPAP Will repeat VBG in 2 hours and transition patient to her baseline home O2 at 6 L continuous Will continue BiPAP at night  Acute on chronic heart failure with preserved ejection  fraction (HFpEF) (HCC) Last known LVEF of 50 to 55% from a 2D echocardiogram which was done 06/25 Imaging is concerning for persistent mixed interstitial and airspace opacification and BNP is elevated Will place patient on Lasix  40 mg IV every 12 Follow-up results of CT scan of the chest without contrast for further evaluation  Laceration of left leg Treatment as outlined in 3  ABLA (acute blood loss anemia) Secondary to left lower extremity laceration following a fall in the setting of chronic anticoagulant use and varicose veins Noted to have a hemoglobin of 6.5 decreased from her baseline Will transfuse 1 unit of packed RBC Will check serial H&H and transfuse as needed  Atrial fibrillation (HCC) Will continue Cardizem  for rate control with holding parameters Hold Xarelto  due to acute blood loss anemia following left lower extremity laceration  Fall Place patient on fall precautions PT evaluation when appropriate  Obesity, Class III, BMI 40-49.9 (morbid obesity) Complicates overall prognosis and care Lifestyle modification and exercise has been discussed with patient in detail  Rheumatoid arthritis (HCC) Continue prednisone  and hydroxychloroquine   Hypothyroidism Continue Synthroid       Advance Care Planning:   Code Status: Full Code .  Patient has a MOST form  Consults: None  Family Communication: Patient's condition and plan of care was discussed with her daughter-in-law Delon Novak over the phone.  All questions and concerns have been addressed.  She verbalizes understanding and agrees with the plan.  Severity of Illness: The appropriate patient status for this patient is INPATIENT. Inpatient status is judged to be reasonable and necessary in order to provide the required  intensity of service to ensure the patient's safety. The patient's presenting symptoms, physical exam findings, and initial radiographic and laboratory data in the context of their chronic  comorbidities is felt to place them at high risk for further clinical deterioration. Furthermore, it is not anticipated that the patient will be medically stable for discharge from the hospital within 2 midnights of admission.   * I certify that at the point of admission it is my clinical judgment that the patient will require inpatient hospital care spanning beyond 2 midnights from the point of admission due to high intensity of service, high risk for further deterioration and high frequency of surveillance required.*  Author: Aimee Somerset, MD 06/14/2024 2:29 PM  For on call review [email or web address].

## 2024-06-14 NOTE — Progress Notes (Signed)
 Transported patient up on bipap from ER with no events.

## 2024-06-14 NOTE — Assessment & Plan Note (Signed)
Complicates overall prognosis and care ?Lifestyle modification and exercise has been discussed with patient in detail ?

## 2024-06-14 NOTE — ED Notes (Signed)
 Pt placed back on Bipap as previous settings.

## 2024-06-14 NOTE — ED Triage Notes (Addendum)
 Pt arrived via EMS due to a fall today from [facility]. Per pt she was taking off her cpap at [facility] today and fell. Pt normally wears 6l/min via [place] at baseline. EMS sts that pt was found by staff and one to two CPR compressions were started and pt woke up. EMS found pt to be in the low 60's RA and was not able to get her o2 up with her 6l/min via [place] so they applied 15l/min via [facility]. Pt is A/Ox4 at this time. MD at bedside. Pt was placed on 6l/min via  per MD order at bedside. Pt o2 sat fell into the low 60's. Pt placed on 10l/min via NRB. Pt has large laceration on left anterior leg. Bleeding controlled.

## 2024-06-15 ENCOUNTER — Encounter: Admitting: Family

## 2024-06-15 DIAGNOSIS — J9612 Chronic respiratory failure with hypercapnia: Secondary | ICD-10-CM | POA: Diagnosis not present

## 2024-06-15 DIAGNOSIS — J9601 Acute respiratory failure with hypoxia: Secondary | ICD-10-CM | POA: Diagnosis not present

## 2024-06-15 LAB — GLUCOSE, CAPILLARY
Glucose-Capillary: 108 mg/dL — ABNORMAL HIGH (ref 70–99)
Glucose-Capillary: 186 mg/dL — ABNORMAL HIGH (ref 70–99)
Glucose-Capillary: 133 mg/dL — ABNORMAL HIGH (ref 70–99)
Glucose-Capillary: 116 mg/dL — ABNORMAL HIGH (ref 70–99)

## 2024-06-15 LAB — HEMOGLOBIN: Hemoglobin: 7.3 g/dL — ABNORMAL LOW (ref 12.0–15.0)

## 2024-06-15 LAB — BLOOD GAS, VENOUS
Bicarbonate: 36.5 mmol/L — ABNORMAL HIGH (ref 20.0–28.0)
Patient temperature: 37 mmol/L — AB (ref 0.0–2.0)
pCO2, Ven: 76 mmHg (ref 44–60)
pH, Ven: 7.29 (ref 7.25–7.43)
pO2, Ven: 36.5 mmol/L — AB (ref 32–45)

## 2024-06-15 LAB — BASIC METABOLIC PANEL WITH GFR
Anion gap: 9 (ref 5–15)
BUN: 16 mg/dL (ref 8–23)
CO2: 37 mmol/L — ABNORMAL HIGH (ref 22–32)
Calcium: 7.8 mg/dL — ABNORMAL LOW (ref 8.9–10.3)
Chloride: 93 mmol/L — ABNORMAL LOW (ref 98–111)
Creatinine, Ser: 1 mg/dL (ref 0.44–1.00)
GFR, Estimated: >60 mL/min (ref >60)
Glucose, Bld: 89 mg/dL (ref 70–99)
Potassium: 3.1 mmol/L — ABNORMAL LOW (ref 3.5–5.1)
Sodium: 139 mmol/L (ref 135–145)

## 2024-06-15 LAB — HEMOGLOBIN AND HEMATOCRIT, BLOOD
HCT: 21.3 % — ABNORMAL LOW (ref 36.0–46.0)
Hemoglobin: 6.4 g/dL — ABNORMAL LOW (ref 12.0–15.0)

## 2024-06-15 LAB — PREPARE RBC (CROSSMATCH)

## 2024-06-15 MED ORDER — POTASSIUM CHLORIDE CRYS ER 20 MEQ PO TBCR
40.0000 meq | EXTENDED_RELEASE_TABLET | Freq: Two times a day (BID) | ORAL | Status: AC
  Administered 2024-06-15 (×2): 40 meq via ORAL
  Filled 2024-06-15 (×2): qty 2

## 2024-06-15 MED ORDER — SODIUM CHLORIDE 0.9% IV SOLUTION: Freq: Once | INTRAVENOUS | Status: AC

## 2024-06-15 MED ORDER — MORPHINE SULFATE (PF) 2 MG/ML IV SOLN: 2.0000 mg | INTRAVENOUS | Status: DC | PRN

## 2024-06-15 MED ORDER — ACETAMINOPHEN 500 MG PO TABS
500.0000 mg | ORAL_TABLET | Freq: Three times a day (TID) | ORAL | Status: DC | PRN
  Administered 2024-06-15 – 2024-06-17 (×4): 500 mg via ORAL
  Filled 2024-06-15 (×4): qty 1

## 2024-06-15 NOTE — Progress Notes (Signed)
 [facility]  - [place] [facility]   PATIENT NAME: [name]    MR#:  [id number]  DATE OF BIRTH:  25-Feb-1956  SUBJECTIVE:  no family at bedside. Patient fast asleep with BiPAP on. Received two units of blood transfusion. Hemoglobin 7.4. No other issues per RN    VITALS:  Blood pressure (!) 92/54, pulse 83, temperature 98.3 F (36.8 C), temperature source Oral, resp. rate 16, height 5' 2 (1.575 m), weight 113.4 kg, SpO2 97%.  PHYSICAL EXAMINATION:   GENERAL:  68 y.o.-year-old patient with no acute distress. Obese chronically ill LUNGS: decreased breath sounds bilaterally, no wheezing CARDIOVASCULAR: S1, S2 normal. No murmur   ABDOMEN: Soft, nontender, nondistended. Bowel sounds present.  EXTREMITIES lower extremity dressing present NEUROLOGIC: nonfocal  patient is alert and awake SKIN: per RN  LABORATORY PANEL:  CBC Recent Labs  Lab 06/14/24 0947 06/14/24 1810 06/15/24 0526 06/15/24 1124  WBC 11.4*  --   --   --   HGB 6.5*   < > 6.4* 7.3*  HCT 22.3*   < > 21.3*  --   PLT 144*  --   --   --    < > = values in this interval not displayed.    Chemistries  Recent Labs  Lab 06/14/24 0947 06/15/24 0526  NA 136 139  K 3.5 3.1*  CL 96* 93*  CO2 25 37*  GLUCOSE 103* 89  BUN 21 16  CREATININE 1.11* 1.00  CALCIUM  7.9* 7.8*  AST 17  --   ALT 16  --   ALKPHOS 53  --   BILITOT 0.8  --    Cardiac Enzymes No results for input(s): TROPONINI in the last 168 hours. RADIOLOGY:  CT CHEST WO CONTRAST Result Date: 06/14/2024 CLINICAL DATA:  Provided history for unenhanced exam: Respiratory illness, nondiagnostic xray; Provided history for contrast enhanced exam: Pneumonia, complication suspected, xray done Per technologist notes, fall today. EXAM: CT CHEST WITHOUT CONTRAST CT CHEST WITH CONTRAST TECHNIQUE: Please note, unenhanced CT was initially performed. 10 minutes later contrast-enhanced CT was performed. Both exams will be reported together.  Multidetector CT imaging of the chest was performed following the standard protocol without IV contrast. Subsequently, multidetector CT imaging of the chest was performed during intravenous contrast administration. RADIATION DOSE REDUCTION: This exam was performed according to the departmental dose-optimization program which includes automated exposure control, adjustment of the mA and/or kV according to patient size and/or use of iterative reconstruction technique. CONTRAST:  75 cc Omnipaque  300 IV COMPARISON:  Chest radiograph earlier today, chest CT 01/11/2020 FINDINGS: Cardiovascular: The heart is upper normal in size. There mitral annulus calcifications. The main pulmonary artery is dilated at 4.5 cm. Moderate atherosclerosis of the thoracic aorta. The ascending aorta is dilated at 4.1 cm. No evidence of aortic injury or acute aortic finding. Plaque in the descending aorta is irregular. There is no pericardial effusion. Mediastinum/Nodes: Scattered small mediastinal lymph nodes, all subcentimeter short axis. No suspicious lymphadenopathy. Unremarkable esophagus. Lungs/Pleura: Emphysema. Elevated right hemidiaphragm with associated atelectasis or scarring in the right lower lobe. Additional areas of bandlike atelectasis throughout both lungs. Dependent nodular densities in the right upper lobe suspicious for infection. Trace pleural thickening/effusions. There are multiple calcified granuloma in the right lung. No debris in the trachea or central airways. No pneumothorax. Upper Abdomen: Suspected hepatic steatosis. Cyst in the right upper kidney. No further follow-up imaging is recommended. Musculoskeletal: Mild compression deformities from T4 through T8 is well as involving T11,  chronic. Chronic vertebral body hemangioma. No acute rib fractures. IMPRESSION: 1. Dependent nodular densities in the right upper lobe suspicious for infection. 2. Elevated right hemidiaphragm with associated atelectasis or scarring in  the right lower lobe. Additional areas of bandlike atelectasis throughout both lungs. 3. Dilated main pulmonary artery at 4.5 cm, can be seen with pulmonary arterial hypertension. 4. No evidence of traumatic injury related to fall. 5. Aortic atherosclerosis and coronary artery calcifications. Plaque in the descending aorta is irregular. Aortic Atherosclerosis (ICD10-I70.0).  Emphysema (ICD10-J43.9). Electronically Signed   By: Andrea Gasman M.D.   On: 06/14/2024 17:17   CT CHEST W CONTRAST Result Date: 06/14/2024 CLINICAL DATA:  Provided history for unenhanced exam: Respiratory illness, nondiagnostic xray; Provided history for contrast enhanced exam: Pneumonia, complication suspected, xray done Per technologist notes, fall today. EXAM: CT CHEST WITHOUT CONTRAST CT CHEST WITH CONTRAST TECHNIQUE: Please note, unenhanced CT was initially performed. 10 minutes later contrast-enhanced CT was performed. Both exams will be reported together. Multidetector CT imaging of the chest was performed following the standard protocol without IV contrast. Subsequently, multidetector CT imaging of the chest was performed during intravenous contrast administration. RADIATION DOSE REDUCTION: This exam was performed according to the departmental dose-optimization program which includes automated exposure control, adjustment of the mA and/or kV according to patient size and/or use of iterative reconstruction technique. CONTRAST:  75 cc Omnipaque  300 IV COMPARISON:  Chest radiograph earlier today, chest CT 01/11/2020 FINDINGS: Cardiovascular: The heart is upper normal in size. There mitral annulus calcifications. The main pulmonary artery is dilated at 4.5 cm. Moderate atherosclerosis of the thoracic aorta. The ascending aorta is dilated at 4.1 cm. No evidence of aortic injury or acute aortic finding. Plaque in the descending aorta is irregular. There is no pericardial effusion. Mediastinum/Nodes: Scattered small mediastinal lymph  nodes, all subcentimeter short axis. No suspicious lymphadenopathy. Unremarkable esophagus. Lungs/Pleura: Emphysema. Elevated right hemidiaphragm with associated atelectasis or scarring in the right lower lobe. Additional areas of bandlike atelectasis throughout both lungs. Dependent nodular densities in the right upper lobe suspicious for infection. Trace pleural thickening/effusions. There are multiple calcified granuloma in the right lung. No debris in the trachea or central airways. No pneumothorax. Upper Abdomen: Suspected hepatic steatosis. Cyst in the right upper kidney. No further follow-up imaging is recommended. Musculoskeletal: Mild compression deformities from T4 through T8 is well as involving T11, chronic. Chronic vertebral body hemangioma. No acute rib fractures. IMPRESSION: 1. Dependent nodular densities in the right upper lobe suspicious for infection. 2. Elevated right hemidiaphragm with associated atelectasis or scarring in the right lower lobe. Additional areas of bandlike atelectasis throughout both lungs. 3. Dilated main pulmonary artery at 4.5 cm, can be seen with pulmonary arterial hypertension. 4. No evidence of traumatic injury related to fall. 5. Aortic atherosclerosis and coronary artery calcifications. Plaque in the descending aorta is irregular. Aortic Atherosclerosis (ICD10-I70.0).  Emphysema (ICD10-J43.9). Electronically Signed   By: Andrea Gasman M.D.   On: 06/14/2024 17:17   CT Cervical Spine Wo Contrast Result Date: 06/14/2024 CLINICAL DATA:  Neck trauma (Age >= 65y) EXAM: CT CERVICAL SPINE WITHOUT CONTRAST TECHNIQUE: Multidetector CT imaging of the cervical spine was performed without intravenous contrast. Multiplanar CT image reconstructions were also generated. RADIATION DOSE REDUCTION: This exam was performed according to the departmental dose-optimization program which includes automated exposure control, adjustment of the mA and/or kV according to patient size and/or  use of iterative reconstruction technique. COMPARISON:  None Available. FINDINGS: Alignment: Anatomic. Skull base and vertebrae: Intact.  No osseous lesions are present. There is fusion of the left facets at C2-3. Soft tissues and spinal canal: No paraspinous hematoma or soft tissue swelling present. Moderate calcifications within the carotid bulbs bilaterally. Surgical clips in the anterior neck near the thyroid  fossa. Disc levels: Diffuse degenerative disc disease with mild endplate ridging and mild-to-moderate bilateral facet arthrosis throughout the cervical spine. No apparent disc herniation or significant spinal canal or neural foraminal stenosis. Upper chest: Emphysema. There are also ground-glass and reticular pulmonary opacities. Other: None. IMPRESSION: 1. Moderate diffuse chronic degenerative disc disease and facet arthropathy without evidence of acute traumatic injury. 2. Emphysema. Electronically Signed   By: Evalene Coho M.D.   On: 06/14/2024 12:55   CT Head Wo Contrast Result Date: 06/14/2024 CLINICAL DATA:  Head trauma, minor (Age >= 65y) EXAM: CT HEAD WITHOUT CONTRAST TECHNIQUE: Contiguous axial images were obtained from the base of the skull through the vertex without intravenous contrast. RADIATION DOSE REDUCTION: This exam was performed according to the departmental dose-optimization program which includes automated exposure control, adjustment of the mA and/or kV according to patient size and/or use of iterative reconstruction technique. COMPARISON:  CT of the head dated June 13, 2024. FINDINGS: Brain: Mild periventricular white matter disease. Otherwise normal brain. No evidence of hemorrhage, mass, cortical infarct or hydrocephalus. Vascular: Mild calcific atheromatous disease within the carotid siphons. Skull: Intact and unremarkable.  No osseous lesions present. Sinuses/Orbits: Moderate opacification of the right maxillary sinus wall with radio dense secretions. Normal orbits. Other:  None. IMPRESSION: 1. Mild periventricular white matter disease. 2. Chronic appearing right paranasal sinus disease. Electronically Signed   By: Evalene Coho M.D.   On: 06/14/2024 12:50   DG Knee 2 Views Left Result Date: 06/14/2024 CLINICAL DATA:  Fall.  Pain. EXAM: LEFT KNEE - 1-2 VIEW COMPARISON:  None Available. FINDINGS: Slight lateral subluxation of the proximal tibia with respect to the distal femur. Subchondral sclerosis, osteophytosis and marked loss of joint space in the medial compartment. No definite joint effusion. No fracture. IMPRESSION: 1. No acute findings. 2. Left knee osteoarthritis, worst in the medial compartment. Electronically Signed   By: Newell Eke M.D.   On: 06/14/2024 10:28   DG Ankle Complete Left Result Date: 06/14/2024 CLINICAL DATA:  Fall. EXAM: LEFT ANKLE COMPLETE - 3+ VIEW COMPARISON:  None Available. FINDINGS: Osteopenia. No acute osseous abnormality. Degenerative changes in the ankle joint. IMPRESSION: 1. No acute osseous abnormality. 2. Degenerative changes in the ankle joint. Electronically Signed   By: Newell Eke M.D.   On: 06/14/2024 10:27   DG Chest Port 1 View Result Date: 06/14/2024 CLINICAL DATA:  Hypoxia, fall. EXAM: PORTABLE CHEST 1 VIEW COMPARISON:  06/13/2024 and CT chest 04/20/2024. FINDINGS: Trachea is midline. Heart is enlarged. Thoracic aorta is calcified. Lungs are low in volume with mixed interstitial and airspace opacification, similar to yesterday's exam. Elevated right hemidiaphragm. No definite pleural fluid. IMPRESSION: Persistent mixed interstitial and airspace opacification may be due to edema or pneumonia. Electronically Signed   By: Newell Eke M.D.   On: 06/14/2024 10:26   CT Head Wo Contrast Result Date: 06/13/2024 CLINICAL DATA:  Head trauma, minor (Age >= 65y) Unwitnessed fall in the bathroom. EXAM: CT HEAD WITHOUT CONTRAST TECHNIQUE: Contiguous axial images were obtained from the base of the skull through the vertex  without intravenous contrast. RADIATION DOSE REDUCTION: This exam was performed according to the departmental dose-optimization program which includes automated exposure control, adjustment of the mA and/or kV according to  patient size and/or use of iterative reconstruction technique. COMPARISON:  None Available. FINDINGS: Brain: No intracranial hemorrhage, mass effect, or midline shift. No hydrocephalus. The basilar cisterns are patent. Mild periventricular white matter hypodensity typical of chronic small vessel ischemia. No evidence of territorial infarct or acute ischemia. No extra-axial or intracranial fluid collection. Vascular: Atherosclerosis of skullbase vasculature without hyperdense vessel or abnormal calcification. Skull: No fracture or focal lesion. Sinuses/Orbits: Mucosal thickening with high density within the right maxillary sinus, however no evidence of facial bone fracture. Near complete opacification of left mastoid air cells, partial opacification of right mastoid air cells. Other: None. IMPRESSION: 1. No acute intracranial abnormality. No skull fracture. 2. Mild chronic small vessel ischemia. 3. Mucosal thickening with high density within the right maxillary sinus, however no evidence of facial bone fracture. This may be related to chronic sinusitis, including fungal infection. 4. Near complete opacification of left mastoid air cells, partial opacification of right mastoid air cells. Electronically Signed   By: Andrea Gasman M.D.   On: 06/13/2024 18:42   DG Chest 1 View Result Date: 06/13/2024 CLINICAL DATA:  hypoxia EXAM: CHEST  1 VIEW COMPARISON:  May 31, 2024 FINDINGS: Diffuse interstitial opacities throughout both lungs. Elevation of the right hemidiaphragm is unchanged. No focal airspace consolidation, pleural effusion, or pneumothorax. No cardiomegaly. Tortuous aorta with aortic atherosclerosis. No acute fracture or destructive lesions. Multilevel thoracic osteophytosis. IMPRESSION:  Cardiomegaly with findings of either interstitial edema or viral/atypical infection. Electronically Signed   By: Rogelia Myers M.D.   On: 06/13/2024 18:16   Assessment and Plan  LEKISHA MCGHEE is a 68 y.o. female with medical history significant for morbid obesity with BMI of 45.73, history of atrial fibrillation status post cardioversion 05/25 on chronic anticoagulation therapy, history of DVT, chronic respiratory failure on 6 L via nasal cannula and BiPAP at night, history of hypothyroidism, rheumatoid arthritis, varicose veins involving her lower extremities, history of recurrent epistaxis who presents to the emergency room from the skilled nursing facility where she resides for evaluation of change in mental status. According to the patient she does not remember the events that preceded her fall.  She was found by the nursing home staff on the floor and hypoxic with room air pulse oximetry in the 60s.   Chest x-ray reviewed by me shows persistent mixed interstitial and airspace opacification may be due to edema or pneumonia.  CT scan of the head without contrast showed no acute intracranial abnormality. No skull fracture. Mild chronic small vessel ischemia. Mucosal thickening with high density within the right maxillary sinus, however no evidence of facial bone fracture. This may be related to chronic sinusitis, including fungal infection. Near complete opacification of left mastoid air cells, partial opacification of right mastoid air cells.  Cervical spine CT shows moderate diffuse chronic degenerative disc disease and facet arthropathy without evidence of acute traumatic injury. Emphysema.  Left ankle x-ray showed no acute osseous abnormality. Degenerative changes in the ankle joint.  Left knee x-ray showed  no acute findings. Left knee osteoarthritis, worst in the medial compartment.  Acute hypoxic on chronic hypercapnic respiratory failure (HCC) --Patient with a history of chronic  hypoxic hypercapnic respiratory failure found with decreased responsiveness on room air pulse oximetry in the 60s --VBG showed uncompensated respiratory acidosis -- when patient off BiPAP as needed discussed with RT. --Will continue BiPAP at night--routine   Acute on chronic heart failure with preserved ejection fraction (HFpEF) (HCC) -- LVEF of 50 to 55% from  a 2D echocardiogram which was done 06/25 --Imaging is concerning for persistent mixed interstitial and airspace opacification and BNP is elevated -- - on Lasix  40 mg IV every 12   Laceration of left leg Treatment as outlined in 3   ABLA (acute blood loss anemia) --Secondary to left lower extremity laceration following a fall in the setting of chronic anticoagulant use and varicose veins --Noted to have a hemoglobin of 6.5 decreased from her baseline (8--9.4) --hgb 7.4  after 2 unit BT   Atrial fibrillation (HCC) --Will continue Cardizem   --Hold Xarelto  due to acute blood loss anemia following left lower extremity laceration   Fall --Place patient on fall precautions --PT evaluation    Obesity, Class III, BMI 40-49.9 (morbid obesity) --Complicates overall prognosis and care   Rheumatoid arthritis (HCC) --Continue prednisone  and hydroxychloroquine    Hypothyroidism --Continue Synthroid    patient has multiple chronic comorbidities. High risk for respiratory failure and mechanical ventilator. Patient remains a full code.      Procedures: Family communication :none Consults : CODE STATUS: full DVT Prophylaxis :on hold due to bleeding Level of care: Progressive Status is: Inpatient Remains inpatient appropriate because: fall, acute blood loss anemia, acute on chronic hypoxic respiratory failure    TOTAL TIME TAKING CARE OF THIS PATIENT: 40 minutes.  >50% time spent on counselling and coordination of care  Note: This dictation was prepared with Dragon dictation along with smaller phrase technology. Any  transcriptional errors that result from this process are unintentional.  Leita Blanch M.D    [facility]   CC: Primary care physician; [name] DASEN, PA-C

## 2024-06-15 NOTE — Progress Notes (Addendum)
 OT Cancellation Note  Patient Details Name: [name] MRN: [id number] DOB: [birth year]   Cancelled Treatment:    Reason Eval/Treat Not Completed: Medical issues which prohibited therapy. Pt with Hgb of 6.4 this AM pending a blood transfusion. Pt currently remains on bipap. Will follow up once pt is medically appropriate.  Halcyon Heck E Daveah Varone 06/15/2024, 8:14 AM

## 2024-06-15 NOTE — Progress Notes (Signed)
 PT Cancellation Note  Patient Details Name: [name] MRN: [id number] DOB: [birth year]   Cancelled Treatment:    Reason Eval/Treat Not Completed: Patient not medically ready PT orders received, chart reviewed. Pt awaiting blood transfusion 2/2 low Hgb, still requiring BiPAP. Will hold PT evaluation at this time & f/u once pt more appropriate for exertional activity.  [name], PT, DPT 06/15/24, 12:09 PM   [name] 06/15/2024, 12:08 PM

## 2024-06-15 NOTE — Plan of Care (Signed)
  Problem: Coping: Goal: Ability to adjust to condition or change in health will improve Outcome: Progressing   Problem: Nutritional: Goal: Maintenance of adequate nutrition will improve Outcome: Progressing   Problem: Skin Integrity: Goal: Risk for impaired skin integrity will decrease Outcome: Progressing   Problem: Tissue Perfusion: Goal: Adequacy of tissue perfusion will improve Outcome: Progressing   Problem: Coping: Goal: Level of anxiety will decrease Outcome: Progressing   Problem: Elimination: Goal: Will not experience complications related to urinary retention Outcome: Progressing   Problem: Pain Managment: Goal: General experience of comfort will improve and/or be controlled Outcome: Progressing   Problem: Safety: Goal: Ability to remain free from injury will improve Outcome: Progressing

## 2024-06-15 NOTE — Plan of Care (Signed)

## 2024-06-16 ENCOUNTER — Inpatient Hospital Stay

## 2024-06-16 DIAGNOSIS — J9601 Acute respiratory failure with hypoxia: Secondary | ICD-10-CM | POA: Diagnosis not present

## 2024-06-16 DIAGNOSIS — J9612 Chronic respiratory failure with hypercapnia: Secondary | ICD-10-CM | POA: Diagnosis not present

## 2024-06-16 LAB — GLUCOSE, CAPILLARY
Glucose-Capillary: 92 mg/dL (ref 70–99)
Glucose-Capillary: 109 mg/dL — ABNORMAL HIGH (ref 70–99)
Glucose-Capillary: 129 mg/dL — ABNORMAL HIGH (ref 70–99)
Glucose-Capillary: 107 mg/dL — ABNORMAL HIGH (ref 70–99)

## 2024-06-16 MED ORDER — SULFAMETHOXAZOLE-TRIMETHOPRIM 800-160 MG PO TABS
1.0000 | ORAL_TABLET | Freq: Two times a day (BID) | ORAL | Status: AC
  Administered 2024-06-16 – 2024-06-22 (×14): 1 via ORAL
  Filled 2024-06-16 (×15): qty 1

## 2024-06-16 MED ORDER — ADULT MULTIVITAMIN W/MINERALS CH
1.0000 | ORAL_TABLET | Freq: Every day | ORAL | Status: DC
  Administered 2024-06-16 – 2024-07-01 (×16): 1 via ORAL
  Filled 2024-06-16 (×16): qty 1

## 2024-06-16 MED ORDER — RIVAROXABAN 20 MG PO TABS
20.0000 mg | ORAL_TABLET | Freq: Every day | ORAL | Status: DC
  Administered 2024-06-16 – 2024-06-17 (×2): 20 mg via ORAL
  Filled 2024-06-16 (×2): qty 1

## 2024-06-16 MED ORDER — FERROUS SULFATE 325 (65 FE) MG PO TABS
325.0000 mg | ORAL_TABLET | Freq: Two times a day (BID) | ORAL | Status: DC
  Administered 2024-06-16 – 2024-07-01 (×30): 325 mg via ORAL
  Filled 2024-06-16 (×30): qty 1

## 2024-06-16 NOTE — Plan of Care (Signed)

## 2024-06-16 NOTE — Progress Notes (Signed)
 OT Cancellation Note  Patient Details Name: [name] MRN: [id number] DOB: Jan 23, 1956   Cancelled Treatment:    Reason Eval/Treat Not Completed: Medical issues which prohibited therapy. Chart reviewed and discussed with team. Pt is currently on continuous bipap and not medically stable. Per MD, can check back tomorrow.   [name] 06/16/2024, 10:15 AM

## 2024-06-16 NOTE — Care Management Important Message (Signed)
 Important Message  Patient Details  Name: [name] MRN: [id number] Date of Birth: January 18, 1956   Important Message Given:  Yes - Medicare IM     [name] 06/16/2024, 1:24 PM

## 2024-06-16 NOTE — Progress Notes (Signed)
 PT Cancellation Note  Patient Details Name: ARLETT GOOLD MRN: [id number] DOB: Feb 23, 1956   Cancelled Treatment:    Reason Eval/Treat Not Completed: Patient not medically ready. Chart reviewed and discussed with team. Currently on continuous bipap and not medically stable. Per MD, can check back tomorrow.   Jacquelynn Friend 06/16/2024, 10:07 AM Corean Dade, PT, DPT, GCS [phone]

## 2024-06-16 NOTE — Progress Notes (Addendum)
 [facility]  - [facility]   PATIENT NAME: [name]    MR#:  [id number]  DATE OF BIRTH:  05-19-1956  SUBJECTIVE:  no family at bedside. Spoke with patient's daughter in law Delon Novak and updated  lives of pain at the left lower extremity laceration site. No bleeding noted. Small swelling due to hematoma. Currently on 90 L high flow nasal cannula trying to wean her down normally she uses 6 L nasal [facility]ula at [facility].   VITALS:  Blood pressure (!) 91/43, pulse 78, temperature 98.1 F (36.7 C), temperature source Axillary, resp. rate 15, height 5' 2 (1.575 m), weight 113.4 kg, SpO2 100%.  PHYSICAL EXAMINATION:   GENERAL:  68 y.o.-year-old patient with no acute distress. Obese chronically ill LUNGS: decreased breath sounds bilaterally, no wheezing CARDIOVASCULAR: S1, S2 normal. No murmur   ABDOMEN: Soft, nontender, nondistended. Bowel sounds present.  EXTREMITIES  NEUROLOGIC: nonfocal  patient is alert and awake SKIN: as above  LABORATORY PANEL:  CBC Recent Labs  Lab 06/14/24 0947 06/14/24 1810 06/15/24 0526 06/15/24 1124  WBC 11.4*  --   --   --   HGB 6.5*   < > 6.4* 7.3*  HCT 22.3*   < > 21.3*  --   PLT 144*  --   --   --    < > = values in this interval not displayed.    Chemistries  Recent Labs  Lab 06/14/24 0947 06/15/24 0526  NA 136 139  K 3.5 3.1*  CL 96* 93*  CO2 25 37*  GLUCOSE 103* 89  BUN 21 16  CREATININE 1.11* 1.00  CALCIUM  7.9* 7.8*  AST 17  --   ALT 16  --   ALKPHOS 53  --   BILITOT 0.8  --    Cardiac Enzymes No results for input(s): TROPONINI in the last 168 hours. RADIOLOGY:  CT CHEST WO CONTRAST Result Date: 06/14/2024 CLINICAL DATA:  Provided history for unenhanced exam: Respiratory illness, nondiagnostic xray; Provided history for contrast enhanced exam: Pneumonia, complication suspected, xray done Per technologist notes, fall today. EXAM: CT CHEST WITHOUT CONTRAST CT CHEST WITH CONTRAST TECHNIQUE:  Please note, unenhanced CT was initially performed. 10 minutes later contrast-enhanced CT was performed. Both exams will be reported together. Multidetector CT imaging of the chest was performed following the standard protocol without IV contrast. Subsequently, multidetector CT imaging of the chest was performed during intravenous contrast administration. RADIATION DOSE REDUCTION: This exam was performed according to the departmental dose-optimization program which includes automated exposure control, adjustment of the mA and/or kV according to patient size and/or use of iterative reconstruction technique. CONTRAST:  75 cc Omnipaque  300 IV COMPARISON:  Chest radiograph earlier today, chest CT 01/11/2020 FINDINGS: Cardiovascular: The heart is upper normal in size. There mitral annulus calcifications. The main pulmonary artery is dilated at 4.5 cm. Moderate atherosclerosis of the thoracic aorta. The ascending aorta is dilated at 4.1 cm. No evidence of aortic injury or acute aortic finding. Plaque in the descending aorta is irregular. There is no pericardial effusion. Mediastinum/Nodes: Scattered small mediastinal lymph nodes, all subcentimeter short axis. No suspicious lymphadenopathy. Unremarkable esophagus. Lungs/Pleura: Emphysema. Elevated right hemidiaphragm with associated atelectasis or scarring in the right lower lobe. Additional areas of bandlike atelectasis throughout both lungs. Dependent nodular densities in the right upper lobe suspicious for infection. Trace pleural thickening/effusions. There are multiple calcified granuloma in the right lung. No debris in the trachea or central airways. No pneumothorax. Upper Abdomen:  Suspected hepatic steatosis. Cyst in the right upper kidney. No further follow-up imaging is recommended. Musculoskeletal: Mild compression deformities from T4 through T8 is well as involving T11, chronic. Chronic vertebral body hemangioma. No acute rib fractures. IMPRESSION: 1. Dependent  nodular densities in the right upper lobe suspicious for infection. 2. Elevated right hemidiaphragm with associated atelectasis or scarring in the right lower lobe. Additional areas of bandlike atelectasis throughout both lungs. 3. Dilated main pulmonary artery at 4.5 cm, can be seen with pulmonary arterial hypertension. 4. No evidence of traumatic injury related to fall. 5. Aortic atherosclerosis and coronary artery calcifications. Plaque in the descending aorta is irregular. Aortic Atherosclerosis (ICD10-I70.0).  Emphysema (ICD10-J43.9). Electronically Signed   By: Andrea Gasman M.D.   On: 06/14/2024 17:17   CT CHEST W CONTRAST Result Date: 06/14/2024 CLINICAL DATA:  Provided history for unenhanced exam: Respiratory illness, nondiagnostic xray; Provided history for contrast enhanced exam: Pneumonia, complication suspected, xray done Per technologist notes, fall today. EXAM: CT CHEST WITHOUT CONTRAST CT CHEST WITH CONTRAST TECHNIQUE: Please note, unenhanced CT was initially performed. 10 minutes later contrast-enhanced CT was performed. Both exams will be reported together. Multidetector CT imaging of the chest was performed following the standard protocol without IV contrast. Subsequently, multidetector CT imaging of the chest was performed during intravenous contrast administration. RADIATION DOSE REDUCTION: This exam was performed according to the departmental dose-optimization program which includes automated exposure control, adjustment of the mA and/or kV according to patient size and/or use of iterative reconstruction technique. CONTRAST:  75 cc Omnipaque  300 IV COMPARISON:  Chest radiograph earlier today, chest CT 01/11/2020 FINDINGS: Cardiovascular: The heart is upper normal in size. There mitral annulus calcifications. The main pulmonary artery is dilated at 4.5 cm. Moderate atherosclerosis of the thoracic aorta. The ascending aorta is dilated at 4.1 cm. No evidence of aortic injury or acute aortic  finding. Plaque in the descending aorta is irregular. There is no pericardial effusion. Mediastinum/Nodes: Scattered small mediastinal lymph nodes, all subcentimeter short axis. No suspicious lymphadenopathy. Unremarkable esophagus. Lungs/Pleura: Emphysema. Elevated right hemidiaphragm with associated atelectasis or scarring in the right lower lobe. Additional areas of bandlike atelectasis throughout both lungs. Dependent nodular densities in the right upper lobe suspicious for infection. Trace pleural thickening/effusions. There are multiple calcified granuloma in the right lung. No debris in the trachea or central airways. No pneumothorax. Upper Abdomen: Suspected hepatic steatosis. Cyst in the right upper kidney. No further follow-up imaging is recommended. Musculoskeletal: Mild compression deformities from T4 through T8 is well as involving T11, chronic. Chronic vertebral body hemangioma. No acute rib fractures. IMPRESSION: 1. Dependent nodular densities in the right upper lobe suspicious for infection. 2. Elevated right hemidiaphragm with associated atelectasis or scarring in the right lower lobe. Additional areas of bandlike atelectasis throughout both lungs. 3. Dilated main pulmonary artery at 4.5 cm, can be seen with pulmonary arterial hypertension. 4. No evidence of traumatic injury related to fall. 5. Aortic atherosclerosis and coronary artery calcifications. Plaque in the descending aorta is irregular. Aortic Atherosclerosis (ICD10-I70.0).  Emphysema (ICD10-J43.9). Electronically Signed   By: Andrea Gasman M.D.   On: 06/14/2024 17:17   Assessment and Plan  ROSEMARIE GALVIS is a 68 y.o. female with medical history significant for morbid obesity with BMI of 45.73, history of atrial fibrillation status post cardioversion 05/25 on chronic anticoagulation therapy, history of DVT, chronic respiratory failure on 6 L via nasal cannula and BiPAP at night, history of hypothyroidism, rheumatoid arthritis,  varicose veins involving her  lower extremities, history of recurrent epistaxis who presents to the emergency room from the skilled nursing facility where she resides for evaluation of change in mental status. According to the patient she does not remember the events that preceded her fall.  She was found by the nursing home staff on the floor and hypoxic with room air pulse oximetry in the 60s.   Chest x-ray reviewed by me shows persistent mixed interstitial and airspace opacification may be due to edema or pneumonia.  CT scan of the head without contrast showed no acute intracranial abnormality. No skull fracture. Mild chronic small vessel ischemia. Mucosal thickening with high density within the right maxillary sinus, however no evidence of facial bone fracture. This may be related to chronic sinusitis, including fungal infection. Near complete opacification of left mastoid air cells, partial opacification of right mastoid air cells.  Cervical spine CT shows moderate diffuse chronic degenerative disc disease and facet arthropathy without evidence of acute traumatic injury. Emphysema.  Left ankle x-ray showed no acute osseous abnormality. Degenerative changes in the ankle joint.  Left knee x-ray showed  no acute findings. Left knee osteoarthritis, worst in the medial compartment.  Acute hypoxic on chronic hypercapnic respiratory failure (HCC) --Patient with a history of chronic hypoxic hypercapnic respiratory failure found with decreased responsiveness on room air pulse oximetry in the 60s --VBG showed uncompensated respiratory acidosis -- when patient off BiPAP as needed discussed with RT. --Will continue BiPAP at night--routine -- wean down oxygen as tolerated. Patient at baseline is on 6 L. -- She is at a high risk for cardiopulmonary arrest   Acute on chronic heart failure with preserved ejection fraction (HFpEF) (HCC) -- LVEF of 50 to 55% from a 2D echocardiogram which was done  06/25 --Imaging is concerning for persistent mixed interstitial and airspace opacification and BNP is elevated -- - on Lasix  40 mg IV every 12-- change to oral from tomorrow   Laceration of left leg status post suture in the ER --status post fallTreatment  --curbsided Gen surgery-- recommends empiric antibiotic. Will give Bactrim for seven days and also recommends xerofoam dressing  -- suture to be removed in two weeks   ABLA (acute blood loss anemia) history of chronic anemia --Secondary to left lower extremity laceration following a fall in the setting of chronic anticoagulant use and varicose veins --Noted to have a hemoglobin of 6.5 decreased from her baseline (8--9.4) --hgb 7.4  after 2 unit BT -- iron supplement   Atrial fibrillation (HCC) --Will continue Cardizem   --Hold Xarelto  due to acute blood loss anemia following left lower extremity laceration --7/2-- Xarelto  resumed   Obesity, Class III, BMI 40-49.9 (morbid obesity) --Complicates overall prognosis and care   Rheumatoid arthritis (HCC) --Continue prednisone  and hydroxychloroquine    Hypothyroidism --Continue Synthroid    patient has multiple chronic comorbidities. High risk for respiratory failure and mechanical ventilator. Patient remains a full code. Palliative care consultation for goals of care  At baseline patient is wheelchair-bound. Per daughter-in-law [facility] is [facility] and she is mainly bedbound and prefers to sit at the edge of the bed.    Procedures: Family communication : Scientist, research (medical)  daughter-in-law Consults : none CODE STATUS: full DVT Prophylaxis :xarelto  now resumed Level of care: Progressive Status is: Inpatient Remains inpatient appropriate because: fall, acute blood loss anemia, acute on chronic hypoxic respiratory failure    TOTAL TIME TAKING CARE OF THIS PATIENT: 40 minutes.  >50% time spent on counselling and coordination of care  Note: This dictation  was prepared with Dragon dictation  along with smaller phrase technology. Any transcriptional errors that result from this process are unintentional.  Leita Blanch M.D    [facility]   CC: Primary care physician; [name] DASEN, PA-C

## 2024-06-17 DIAGNOSIS — J9601 Acute respiratory failure with hypoxia: Secondary | ICD-10-CM | POA: Diagnosis not present

## 2024-06-17 DIAGNOSIS — Z7189 Other specified counseling: Secondary | ICD-10-CM

## 2024-06-17 DIAGNOSIS — J9612 Chronic respiratory failure with hypercapnia: Secondary | ICD-10-CM | POA: Diagnosis not present

## 2024-06-17 LAB — BASIC METABOLIC PANEL WITH GFR
Anion gap: 10 (ref 5–15)
BUN: 16 mg/dL (ref 8–23)
CO2: 37 mmol/L — ABNORMAL HIGH (ref 22–32)
Calcium: 7.9 mg/dL — ABNORMAL LOW (ref 8.9–10.3)
Chloride: 91 mmol/L — ABNORMAL LOW (ref 98–111)
Creatinine, Ser: 0.96 mg/dL (ref 0.44–1.00)
GFR, Estimated: >60 mL/min (ref >60)
Glucose, Bld: 88 mg/dL (ref 70–99)
Potassium: 3.4 mmol/L — ABNORMAL LOW (ref 3.5–5.1)
Sodium: 138 mmol/L (ref 135–145)

## 2024-06-17 LAB — CBC
HCT: 23.1 % — ABNORMAL LOW (ref 36.0–46.0)
Hemoglobin: 6.9 g/dL — ABNORMAL LOW (ref 12.0–15.0)
MCH: 29.2 pg (ref 26.0–34.0)
MCHC: 29.9 g/dL — ABNORMAL LOW (ref 30.0–36.0)
MCV: 97.9 fL (ref 80.0–100.0)
Platelets: 131 10*3/uL — ABNORMAL LOW (ref 150–400)
RBC: 2.36 MIL/uL — ABNORMAL LOW (ref 3.87–5.11)
RDW: 17.5 % — ABNORMAL HIGH (ref 11.5–15.5)
WBC: 5.8 10*3/uL (ref 4.0–10.5)
nRBC: 0 % (ref 0.0–0.2)

## 2024-06-17 LAB — PREPARE RBC (CROSSMATCH)

## 2024-06-17 LAB — GLUCOSE, CAPILLARY
Glucose-Capillary: 88 mg/dL (ref 70–99)
Glucose-Capillary: 95 mg/dL (ref 70–99)
Glucose-Capillary: 115 mg/dL — ABNORMAL HIGH (ref 70–99)
Glucose-Capillary: 135 mg/dL — ABNORMAL HIGH (ref 70–99)
Glucose-Capillary: 102 mg/dL — ABNORMAL HIGH (ref 70–99)

## 2024-06-17 MED ORDER — ENOXAPARIN SODIUM 40 MG/0.4ML IJ SOSY: 40.0000 mg | PREFILLED_SYRINGE | INTRAMUSCULAR | Status: DC

## 2024-06-17 MED ORDER — SODIUM CHLORIDE 0.9% IV SOLUTION: Freq: Once | INTRAVENOUS | Status: AC

## 2024-06-17 MED ORDER — TRAMADOL HCL 50 MG PO TABS
50.0000 mg | ORAL_TABLET | Freq: Four times a day (QID) | ORAL | Status: DC | PRN
  Administered 2024-06-17 – 2024-07-01 (×20): 50 mg via ORAL
  Filled 2024-06-17 (×21): qty 1

## 2024-06-17 MED ORDER — ENOXAPARIN SODIUM 60 MG/0.6ML IJ SOSY
0.5000 mg/kg | PREFILLED_SYRINGE | INTRAMUSCULAR | Status: DC
  Administered 2024-06-17 – 2024-06-23 (×7): 57.5 mg via SUBCUTANEOUS
  Filled 2024-06-17 (×7): qty 0.6

## 2024-06-17 MED ORDER — MORPHINE SULFATE (PF) 2 MG/ML IV SOLN
1.0000 mg | Freq: Two times a day (BID) | INTRAVENOUS | Status: DC | PRN
  Administered 2024-06-18 – 2024-07-01 (×17): 1 mg via INTRAVENOUS
  Filled 2024-06-17 (×19): qty 1

## 2024-06-17 MED ORDER — MORPHINE SULFATE (PF) 2 MG/ML IV SOLN
1.0000 mg | INTRAVENOUS | Status: DC | PRN
  Administered 2024-06-17: 1 mg via INTRAVENOUS
  Filled 2024-06-17: qty 1

## 2024-06-17 MED ORDER — POTASSIUM CHLORIDE CRYS ER 20 MEQ PO TBCR
40.0000 meq | EXTENDED_RELEASE_TABLET | Freq: Once | ORAL | Status: AC
  Administered 2024-06-17: 40 meq via ORAL
  Filled 2024-06-17: qty 2

## 2024-06-17 MED ORDER — ACETAMINOPHEN 500 MG PO TABS
500.0000 mg | ORAL_TABLET | Freq: Three times a day (TID) | ORAL | Status: DC | PRN
  Administered 2024-06-21 – 2024-06-30 (×7): 500 mg via ORAL
  Filled 2024-06-17 (×9): qty 1

## 2024-06-17 NOTE — Evaluation (Signed)
 Occupational Therapy Evaluation Patient Details Name: [name] MRN: [id number] DOB: 1956/03/14 Today's Date: 06/17/2024   History of Present Illness   [name] is a 68 y.o. female with medical history significant for morbid obesity with BMI of 45.73, history of atrial fibrillation status post cardioversion 05/25 on chronic anticoagulation therapy, history of DVT, chronic respiratory failure on 6 L via nasal cannula and BiPAP at night, history of hypothyroidism, rheumatoid arthritis, varicose veins involving her lower extremities, history of recurrent epistaxis who presents to the emergency room from the skilled nursing facility where she resides for evaluation of change in mental status and fall resulting in LLE laceration.     Clinical Impressions Pt was seen for OT evaluation this date. Per last chart review,  pt was last at her previous baseline in April, has been hospitalized numerous times and intended to move to [facility] to be closer to family (was in a different SNF prior to that). She has been at [facility] a total of ~7 days per her report. Pt reports she previously could amb to the bathroom with RW, had PRN assist for ADLs. Required MWC for longer distances. Recent falls.  Pt presents to acute OT demonstrating impaired ADL performance and functional mobility 2/2 weakness, low activity tolerance, pain and balance deficits. Pt found taking a nap on bipap, removed and placed on 10L HFNC with sp02 at 92%, increased to 12L for activity. Pt currently requires total assist for LB dressing and anticipate MIN/MOD A for UB ADLs. Pt with laceration to LLE below knee reporting increased tenderness/pain limiting activity this date. Pt with slow, effortful movements, but able to perform supine<>sit with CGA x2 as well as lateral scoots x2-3 with Min A towards HOB. Pt declining any standing attempts d/t LLE pain. She required Max A x2 to scoot to [facility] once returned to supine and noted to desat to 81%  on 12L HFNC requiring increased time and increase to 13L HFNC to recover to 90's. Pt placed back on bipap to take a nap at end of session and at 94%. Pt would benefit from skilled OT services to address noted impairments and functional limitations to maximize safety and independence while minimizing falls risk and caregiver burden. Do anticipate the need for follow up OT services upon acute hospital DC.      If plan is discharge home, recommend the following:   Assist for transportation;Assistance with cooking/housework;Help with stairs or ramp for entrance;A lot of help with walking and/or transfers;A lot of help with bathing/dressing/bathroom     Functional Status Assessment   Patient has had a recent decline in their functional status and demonstrates the ability to make significant improvements in function in a reasonable and predictable amount of time.     Equipment Recommendations   Other (comment) (defer to next venue)     Recommendations for Other Services         Precautions/Restrictions   Precautions Precautions: Fall Recall of Precautions/Restrictions: Intact Restrictions Weight Bearing Restrictions Per Provider Order: No     Mobility Bed Mobility Overal bed mobility: Needs Assistance Bed Mobility: Supine to Sit, Sit to Supine     Supine to sit: Contact guard, +2 for physical assistance Sit to supine: Contact guard assist, +2 for physical assistance   General bed mobility comments: pt with slow, effortful movements from LLE pain, but able to perform supine<>sit with CGA x2 as well as lateral scoots x2-3 with Min A towards HOB; Max A x2 to  scoot to [facility] once returned to supine    Transfers                   General transfer comment: STS deferred this date d/t L LE pain      Balance Overall balance assessment: Needs assistance Sitting-balance support: Bilateral upper extremity supported, Feet supported Sitting balance-Leahy Scale:  Fair Sitting balance - Comments: no LOB with static sitting EOB; CGA for dynamic scooting task       Standing balance comment: deferred this date d/t L LE pain                           ADL either performed or assessed with clinical judgement   ADL Overall ADL's : Needs assistance/impaired                 Upper Body Dressing : Moderate assistance;Sitting Upper Body Dressing Details (indicate cue type and reason): to adjust and tie gown at bedside Lower Body Dressing: Total assistance;Bed level Lower Body Dressing Details (indicate cue type and reason): to don bil socks                     Vision         Perception         Praxis         Pertinent Vitals/Pain Pain Assessment Pain Assessment: Faces Faces Pain Scale: Hurts even more Pain Location: L LE Pain Descriptors / Indicators: Constant, Discomfort, Grimacing, Guarding Pain Intervention(s): Limited activity within patient's tolerance, Monitored during session, Repositioned     Extremity/Trunk Assessment Upper Extremity Assessment Upper Extremity Assessment: Generalized weakness   Lower Extremity Assessment Lower Extremity Assessment: Generalized weakness   Cervical / Trunk Assessment Cervical / Trunk Assessment: Kyphotic   Communication Communication Communication: No apparent difficulties   Cognition Arousal: Alert Behavior During Therapy: WFL for tasks assessed/performed, Anxious                                 Following commands: Intact       Cueing  General Comments   Cueing Techniques: Verbal cues;Tactile cues      Exercises     Shoulder Instructions      Home Living Family/patient expects to be discharged to:: Skilled nursing facility                                 Additional Comments: Patient lives at [name] where she recent moved to be closer to her family, resided for a few days total between admissions, prior to that  she lived in a different SNF for at least 3 years      Prior Functioning/Environment Prior Level of Function : Needs assist         Mobility (physical): Bed mobility;Transfers;Gait;Stairs ADLs (physical): Grooming;Dressing;Bathing;IADLs;Toileting Mobility Comments: pt states as recently as April she used a RW for short distances and WC for longer distances, unable to self propel ADLs Comments: pt states prior to April she needed assistance with IADLs and showering but can go to the bathroom herself.    OT Problem List: Decreased activity tolerance;Decreased strength;Impaired balance (sitting and/or standing);Pain   OT Treatment/Interventions: Self-care/ADL training;Therapeutic exercise;Therapeutic activities;Energy conservation;DME and/or AE instruction;Patient/family education;Balance training      OT Goals(Current goals can be found in the  care plan section)   Acute Rehab OT Goals Patient Stated Goal: improve function and breathing OT Goal Formulation: With patient Time For Goal Achievement: 07/01/24 Potential to Achieve Goals: Fair ADL Goals Pt Will Perform Grooming: with set-up;sitting Pt Will Perform Upper Body Bathing: sitting;with supervision Pt Will Transfer to Toilet: with min assist;ambulating;regular height toilet;bedside commode;grab bars   OT Frequency:  Min 2X/week    Co-evaluation PT/OT/SLP Co-Evaluation/Treatment: Yes Reason for Co-Treatment: Complexity of the patient's impairments (multi-system involvement);For patient/therapist safety;To address functional/ADL transfers PT goals addressed during session: Mobility/safety with mobility;Balance OT goals addressed during session: ADL's and self-care      AM-PAC OT 6 Clicks Daily Activity     Outcome Measure Help from another person eating meals?: None Help from another person taking care of personal grooming?: A Little Help from another person toileting, which includes using toliet, bedpan, or urinal?: A  Lot Help from another person bathing (including washing, rinsing, drying)?: A Lot Help from another person to put on and taking off regular upper body clothing?: A Little Help from another person to put on and taking off regular lower body clothing?: A Lot 6 Click Score: 16   End of Session Equipment Utilized During Treatment: Oxygen Nurse Communication: Mobility status  Activity Tolerance: Patient tolerated treatment well;Patient limited by pain Patient left: in bed;with call bell/phone within reach;with bed alarm set  OT Visit Diagnosis: Other abnormalities of gait and mobility (R26.89)                Time: 8576-8554 OT Time Calculation (min): 22 min Charges:  OT General Charges $OT Visit: 1 Visit OT Evaluation $OT Eval Low Complexity: 1 Low  [name], OTR/L 06/17/24, 4:15 PM  Sheketa Ende E Cire Deyarmin 06/17/2024, 4:08 PM

## 2024-06-17 NOTE — Evaluation (Signed)
 Physical Therapy Evaluation Patient Details Name: [name] MRN: [id number] DOB: [birth year] Today's Date: 06/17/2024  History of Present Illness  [name] is a 68 y.o. female with medical history significant for morbid obesity with BMI of 45.73, history of atrial fibrillation status post cardioversion 05/25 on chronic anticoagulation therapy, history of DVT, chronic respiratory failure on 6 L via nasal cannula and BiPAP at night, history of hypothyroidism, rheumatoid arthritis, varicose veins involving her lower extremities, history of recurrent epistaxis who presents to the emergency room from the skilled nursing facility where she resides for evaluation of change in mental status.  Clinical Impression  Co-treat with OT performed this date. Pt is a pleasant 68 year old female who was admitted for acute hypoxic respiratory failure. Pt found on BiPAP and then placed on 10L of HFNC. Vitals monitored throughout session.Pt performs bed mobility CGA x2 for safety and physical assistance, pt limited this date by pain in her L LE. Supine <> sit transfer requiring O2 to increase to 12L pt SpO2 staying above 90%. Sit<>supine transfer requiring O2 to increase to 13L since pt desat to 81%. STS transfers and ambulation deferred this date d/t pain in her L LE. Pt demonstrates deficits with strength, activity tolerance, and balance. Pt would benefit from skilled PT interventions to address deficits and meet needs. PT to follow acutely as appropriate.          If plan is discharge home, recommend the following: Assistance with cooking/housework;Assist for transportation;Help with stairs or ramp for entrance;Two people to help with walking and/or transfers;Two people to help with bathing/dressing/bathroom   Can travel by private vehicle   No    Equipment Recommendations None recommended by PT  Recommendations for Other Services       Functional Status Assessment Patient has had a recent decline  in their functional status and demonstrates the ability to make significant improvements in function in a reasonable and predictable amount of time.     Precautions / Restrictions Precautions Precautions: Fall Recall of Precautions/Restrictions: Intact Restrictions Weight Bearing Restrictions Per Provider Order: No      Mobility  Bed Mobility Overal bed mobility: Needs Assistance Bed Mobility: Supine to Sit, Sit to Supine     Supine to sit: Contact guard, +2 for physical assistance Sit to supine: Contact guard assist, +2 for physical assistance   General bed mobility comments: CGA x2 for bed mobility, pt in pain with mobility    Transfers                   General transfer comment: STS deferred this date d/t L LE pain    Ambulation/Gait               General Gait Details: deferred this date d/t L LE pain  Stairs            Wheelchair Mobility     Tilt Bed    Modified Rankin (Stroke Patients Only)       Balance Overall balance assessment: Needs assistance Sitting-balance support: Bilateral upper extremity supported, Feet supported Sitting balance-Leahy Scale: Fair Sitting balance - Comments: supervision static sitting EOB       Standing balance comment: deferred this date d/t L LE pain                             Pertinent Vitals/Pain Pain Assessment Pain Assessment: Faces Faces Pain Scale: Hurts even more Pain  Location: L LE Pain Descriptors / Indicators: Constant, Discomfort, Grimacing, Guarding Pain Intervention(s): Limited activity within patient's tolerance, Monitored during session, Repositioned    Home Living Family/patient expects to be discharged to:: Skilled nursing facility                   Additional Comments: Patient lives at [facility] where she recent moved to be closer to her family, resided for a few days total between admissions, prior to that she lived in a different SNF for at least 3  years    Prior Function Prior Level of Function : Needs assist         Mobility (physical): Bed mobility;Transfers;Gait;Stairs ADLs (physical): Grooming;Dressing;Bathing;IADLs;Toileting Mobility Comments: pt states as recently as April she used a RW for short distances and WC for longer distances, unable to self propel ADLs Comments: pt states prior to April she needed assistance with IADLs and showering but can go to the bathroom herself.     Extremity/Trunk Assessment   Upper Extremity Assessment Upper Extremity Assessment: Generalized weakness    Lower Extremity Assessment Lower Extremity Assessment: Generalized weakness    Cervical / Trunk Assessment Cervical / Trunk Assessment: Kyphotic  Communication   Communication Communication: No apparent difficulties    Cognition Arousal: Alert Behavior During Therapy: WFL for tasks assessed/performed, Anxious   PT - Cognitive impairments: No apparent impairments                         Following commands: Intact       Cueing Cueing Techniques: Verbal cues, Tactile cues     General Comments      Exercises     Assessment/Plan    PT Assessment Patient needs continued PT services  PT Problem List Decreased strength;Decreased activity tolerance;Decreased balance;Decreased mobility;Decreased knowledge of use of DME;Obesity;Cardiopulmonary status limiting activity;Decreased range of motion       PT Treatment Interventions Gait training;Stair training;Functional mobility training;Therapeutic activities;Therapeutic exercise;Balance training;DME instruction;Neuromuscular re-education;Cognitive remediation;Patient/family education;Wheelchair mobility training    PT Goals (Current goals can be found in the Care Plan section)  Acute Rehab PT Goals Patient Stated Goal: get better PT Goal Formulation: With patient Time For Goal Achievement: 07/01/24 Potential to Achieve Goals: Good    Frequency Min 2X/week      Co-evaluation PT/OT/SLP Co-Evaluation/Treatment: Yes Reason for Co-Treatment: Complexity of the patient's impairments (multi-system involvement);For patient/therapist safety;To address functional/ADL transfers PT goals addressed during session: Mobility/safety with mobility;Balance         AM-PAC PT 6 Clicks Mobility  Outcome Measure Help needed turning from your back to your side while in a flat bed without using bedrails?: A Little Help needed moving from lying on your back to sitting on the side of a flat bed without using bedrails?: A Lot Help needed moving to and from a bed to a chair (including a wheelchair)?: A Lot Help needed standing up from a chair using your arms (e.g., wheelchair or bedside chair)?: A Lot Help needed to walk in hospital room?: A Lot Help needed climbing 3-5 steps with a railing? : Total 6 Click Score: 12    End of Session Equipment Utilized During Treatment: Oxygen Activity Tolerance: Patient limited by pain Patient left: in bed;with call bell/phone within reach;with bed alarm set Nurse Communication: Mobility status PT Visit Diagnosis: Repeated falls (R29.6);Muscle weakness (generalized) (M62.81);History of falling (Z91.81);Unsteadiness on feet (R26.81);Difficulty in walking, not elsewhere classified (R26.2);Other abnormalities of gait and mobility (R26.89)  Time: 8577-8554 PT Time Calculation (min) (ACUTE ONLY): 23 min   Charges:                  Lewie Deman Romero-Perozo, SPT  06/17/2024, 3:39 PM

## 2024-06-17 NOTE — Plan of Care (Signed)
  Problem: Education: Goal: Ability to describe self-care measures that may prevent or decrease complications (Diabetes Survival Skills Education) will improve Outcome: Progressing   Problem: Fluid Volume: Goal: Ability to maintain a balanced intake and output will improve Outcome: Progressing   Problem: Skin Integrity: Goal: Risk for impaired skin integrity will decrease Outcome: Progressing   Problem: Tissue Perfusion: Goal: Adequacy of tissue perfusion will improve Outcome: Progressing   Problem: Safety: Goal: Ability to remain free from injury will improve Outcome: Progressing   Plan of care, assessment, treatment., monitoring, and intervention (s) ongoing, see MAR see flowsheet

## 2024-06-17 NOTE — Consult Note (Signed)
 Consultation Note Date: 06/17/2024   Patient Name: [name]  DOB: 1955/12/18  MRN: [id number]  Age / Sex: 68 y.o., female  PCP: [name] ONEIDA DEVONNA Referring Physician: Awanda City, MD  Reason for Consultation: Establishing goals of care  HPI/Patient Profile: Per H&P [name] is a 68 y.o. female with medical history significant for morbid obesity with BMI of 45.73, history of atrial fibrillation status post cardioversion 05/25 on chronic anticoagulation therapy, history of DVT, chronic respiratory failure on 6 L via nasal cannula and BiPAP at night, history of hypothyroidism, rheumatoid arthritis, varicose veins involving her lower extremities, history of recurrent epistaxis who presents to the emergency room from the skilled nursing facility where she resides for evaluation of change in mental status. According to the patient she does not remember the events that preceded her fall.  She was found by the nursing home staff on the floor and hypoxic with room air pulse oximetry in the 60s.  They applied her 6 L via nasal cannula without any improvement in her pulse oximetry and so she was placed on 15 L nonrebreather mask.  Per ER notes, chest compression was started but patient became arousable and responsive and so was discontinued.  She was noted to have a large laceration on her left anterior leg which was sutured in the ER but still oozing.  Clinical Assessment and Goals of Care: Notes reviewed from this admission and previous admissions.  Labs and diagnostics reviewed.  In to see patient.  She is currently resting in bed at this time with oxygen in place.  She states she is divorced and has 1 of 3 children living.  She states she has been living in a long-term care facility for about the past 3 years.  She discusses that at the facility she typically uses BiPAP at night.  She states the BiPAP  has not been working correctly for her and so she has been short of breath, and dizzy when trying to remove it, and this has caused falls.  She states she is mostly bedbound and does not really get up because of pain in her left leg.   We discussed her diagnoses, prognosis, GOC, EOL wishes disposition and options.  Created space and opportunity for patient  to explore thoughts and feelings regarding current medical information.   A detailed discussion was had today regarding advanced directives. The difference between an aggressive medical intervention path and a comfort care path was discussed.  Values and goals of care important to patient and family were attempted to be elicited.  Discussed limitations of medical interventions to prolong quality of life in some situations and discussed the concept of human mortality.  She states these discussions have been brought up in the past.  She states she has decided that right now she wants a full CODE STATUS.   We discussed potential outcomes. She states she may change her mind in the future, but right now would want attempts at life prolongation, no matter what the  quality of life or level of pain and suffering.   She states if she is unable to speak for herself her son would know what to do.    SUMMARY OF RECOMMENDATIONS   Patient advises her BiPAP at her facility is not working correctly and needs to be replaced. Patient wants full code and full scope Patient advises her son would be her surrogate decision maker if she is unable to make decisions for herself  Prognosis:  Poor overall     Primary Diagnoses: Present on Admission:  Acute hypoxic on chronic hypercapnic respiratory failure (HCC)  ABLA (acute blood loss anemia)  Acute on chronic heart failure with preserved ejection fraction (HFpEF) (HCC)  Laceration of left leg  Hypothyroidism  Obesity, Class III, BMI 40-49.9 (morbid obesity)  Atrial fibrillation (HCC)  Rheumatoid  arthritis (HCC)  Fall   I have reviewed the medical record, interviewed the patient and family, and examined the patient. The following aspects are pertinent.  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    HTN (hypertension)    Hypercholesteremia    Hypothyroid    Rheumatoid arthritis(714.0)    Varicose vein of leg    Social History   Socioeconomic History   Marital status: Divorced    Spouse name: Not on file   Number of children: 1   Years of education: Not on file   Highest education level: Some college, no degree  Occupational History   Occupation: Nutritional therapist: [employer]   Occupation: Retired  Tobacco Use   Smoking status: Former    Current packs/day: 0.00    Average packs/day: 1 pack/day for 40.0 years (40.0 ttl pk-yrs)    Types: Cigarettes    Start date: 86    Quit date: 2021    Years since quitting: 4.5   Smokeless tobacco: Never   Tobacco comments:    < 1/2 pack/day  Vaping Use   Vaping status: Never Used  Substance and Sexual Activity   Alcohol use: No   Drug use: No   Sexual activity: Never  Other Topics Concern   Not on file  Social History Narrative   Not on file   Social Drivers of Health   Financial Resource Strain: High Risk (05/24/2024)   Overall Financial Resource Strain (CARDIA)    Difficulty of Paying Living Expenses: Very hard  Food Insecurity: No Food Insecurity (06/14/2024)   Hunger Vital Sign    Worried About Running Out of Food in the Last Year: Never true    Ran Out of Food in the Last Year: Never true  Transportation Needs: No Transportation Needs (06/14/2024)   PRAPARE - Administrator, Civil Service (Medical): No    Lack of Transportation (Non-Medical): No  Physical Activity: Not on file  Stress: Not on file  Social  Connections: Socially Isolated (06/14/2024)   Social Connection and Isolation Panel    Frequency of Communication with Friends and Family: More than three times a week    Frequency of Social Gatherings with Friends and Family: More than three times a week    Attends Religious Services: Never    Database administrator or Organizations: No    Attends Banker  Meetings: Never    Marital Status: Divorced   Family History  Problem Relation Age of Onset   Diabetes Mother    Kidney disease Mother    Lung cancer Father    Alzheimer's disease Maternal Grandmother    Stroke Maternal Grandfather    Heart disease Paternal Grandmother    Unexplained death Paternal Grandfather    Scheduled Meds:  sodium chloride    Intravenous Once   amiodarone   200 mg Oral Daily   atorvastatin   10 mg Oral QHS   budesonide   0.5 mg Nebulization BID   cyanocobalamin  1,000 mcg Oral Daily   diltiazem   240 mg Oral Daily   ferrous sulfate   325 mg Oral BID WC   folic acid   1 mg Oral Daily   furosemide   40 mg Intravenous BID   gabapentin   400 mg Oral TID   hydroxychloroquine   200 mg Oral Daily   ipratropium-albuterol   3 mL Nebulization Q6H   levothyroxine   150 mcg Oral Q0600   melatonin  5 mg Oral QHS   montelukast   10 mg Oral Daily   multivitamin with minerals  1 tablet Oral Daily   pantoprazole   40 mg Oral Daily   predniSONE   5 mg Oral Q breakfast   rivaroxaban   20 mg Oral Daily   senna-docusate  2 tablet Oral BID   sertraline   75 mg Oral Daily   sulfamethoxazole-trimethoprim  1 tablet Oral Q12H   Continuous Infusions: PRN Meds:.acetaminophen , morphine  injection, ondansetron  **OR** ondansetron  (ZOFRAN ) IV, traMADol  Medications Prior to Admission:  Prior to Admission medications   Medication Sig Start Date End Date Taking? Authorizing Provider  acetaminophen  (TYLENOL ) 500 MG tablet Take 500 mg by mouth every 8 (eight) hours as needed for mild pain (pain score 1-3).   Yes [provider]   amiodarone  (PACERONE ) 200 MG tablet Take 200 mg by mouth daily.   Yes [provider]  atorvastatin  (LIPITOR) 10 MG tablet Take 10 mg by mouth at bedtime.   Yes [provider]  benzonatate (TESSALON) 100 MG capsule Take 100 mg by mouth every 8 (eight) hours as needed for cough.   Yes [provider]  budesonide  (PULMICORT ) 0.5 MG/2ML nebulizer solution Take 0.5 mg by nebulization 2 (two) times daily.   Yes [provider]  Cyanocobalamin 1000 MCG TBCR Take 1 tablet by mouth daily.   Yes [provider]  diltiazem  (CARDIZEM  CD) 240 MG 24 hr capsule Take 1 capsule (240 mg total) by mouth daily. 06/08/24  Yes Wouk, Devaughn Sayres, MD  ferrous sulfate  325 (65 FE) MG EC tablet Take 325 mg by mouth daily with breakfast.   Yes [provider]  folic acid  (FOLVITE ) 1 MG tablet Take 1 mg by mouth daily.   Yes [provider]  furosemide  (LASIX ) 80 MG tablet Take 80 mg by mouth daily.   Yes [provider]  gabapentin  (NEURONTIN ) 400 MG capsule Take 400 mg by mouth 3 (three) times daily.   Yes [provider]  hydroxychloroquine  (PLAQUENIL ) 200 MG tablet Take 200 mg by mouth daily.   Yes [provider]  insulin  lispro (HUMALOG) 100 UNIT/ML injection Inject 2-10 Units into the skin 3 (three) times daily before meals. Sliding scale   Yes [provider]  ipratropium-albuterol  (DUONEB) 0.5-2.5 (3) MG/3ML SOLN Take 3 mLs by nebulization every 6 (six) hours.   Yes [provider]  levothyroxine  (SYNTHROID ) 150 MCG tablet Take 150 mcg by mouth daily before breakfast.  Yes [provider]  melatonin 5 MG TABS Take 5 mg by mouth at bedtime.   Yes [provider]  montelukast  (SINGULAIR ) 10 MG tablet Take 10 mg by mouth daily.   Yes [provider]  Multiple Vitamin (MULTIVITAMIN) tablet Take 1 tablet by mouth daily.   Yes [provider]  naloxone (NARCAN) nasal spray 4  mg/0.1 mL Place 1 spray into the nose 3 (three) times daily as needed (symptoms of opiod overdose).   Yes [provider]  oxymetazoline  (AFRIN) 0.05 % nasal spray Place 1 spray into both nostrils 2 (two) times daily. 05/24/24  Yes Wouk, Devaughn Sayres, MD  pantoprazole  (PROTONIX ) 40 MG tablet Take 40 mg by mouth daily.   Yes [provider]  predniSONE  (DELTASONE ) 5 MG tablet Take 5 mg by mouth daily with breakfast.   Yes [provider]  rivaroxaban  (XARELTO ) 20 MG TABS tablet Take 20 mg by mouth daily.   Yes [provider]  sennosides-docusate sodium  (SENOKOT-S) 8.6-50 MG tablet Take 2 tablets by mouth 2 (two) times daily.   Yes [provider]  sertraline  (ZOLOFT ) 25 MG tablet Take 25 mg by mouth daily. Takes with 50mg  tab = 75mg  daily   Yes [provider]  sertraline  (ZOLOFT ) 50 MG tablet Take 50 mg by mouth daily. Takes with 25mg  tab = 75mg  daily   Yes [provider]  traMADol  HCl 25 MG TABS Take by mouth. PRN   Yes [provider]  levofloxacin  (LEVAQUIN ) 750 MG tablet Take 1 tablet (750 mg total) by mouth daily for 7 days. 06/13/24 06/20/24  Viviann Pastor, MD   Allergies  Allergen Reactions   Latex Hives, Itching and Dermatitis    Blisters (also)  Skin blisters   Oxycodone -Acetaminophen  Nausea Only and Other (See Comments)    Violent Vomiting Violent Vomiting   Penicillins Anaphylaxis, Swelling and Rash    Has patient had a PCN reaction causing immediate rash, facial/tongue/throat swelling, SOB or lightheadedness with hypotension: Yes Has patient had a PCN reaction causing severe rash involving mucus membranes or skin necrosis: No Has patient had a PCN reaction that required hospitalization No Has patient had a PCN reaction occurring within the last 10 years: No If all of the above answers are NO, then may proceed with Cephalosporin use.    Strawberry Extract Hives, Itching, Rash, Anaphylaxis, Dermatitis and  Swelling   Oxycodone -Acetaminophen  Nausea And Vomiting   Tyloxapol Nausea And Vomiting   Adhesive [Tape] Rash    Patient prefers paper tape   Wound Dressing Adhesive Dermatitis   Review of Systems  Respiratory:  Positive for shortness of breath.     Physical Exam Pulmonary:     Effort: Pulmonary effort is normal.  Skin:    Comments: Bandage to left lower  Neurological:     Mental Status: She is alert.     Vital Signs: BP 102/60   Pulse 84   Temp 98.4 F (36.9 C) (Oral)   Resp (!) 21   Ht 5' 2 (1.575 m)   Wt 113.4 kg   SpO2 90%   BMI 45.73 kg/m  Pain Scale: 0-10 POSS *See Group Information*: 1-Acceptable,Awake and alert Pain Score: 7    SpO2: SpO2: 90 % O2 Device:SpO2: 90 % O2 Flow Rate: .O2 Flow Rate (L/min): 10 L/min  IO: Intake/output summary:  Intake/Output Summary (Last 24 hours) at 06/17/2024 1158 Last data filed at 06/16/2024 2322 Gross per 24 hour  Intake 240 ml  Output  2500 ml  Net -2260 ml    LBM: Last BM Date : 06/16/24 Baseline Weight: Weight: 113.4 kg Most recent weight: Weight: 113.4 kg       Signed by: Camelia Lewis, NP   Please contact Palliative Medicine Team phone at [phone] for questions and concerns.  For individual provider: See Tracey

## 2024-06-17 NOTE — Progress Notes (Signed)
 PROGRESS NOTE    [name]  FMW:[id number] DOB: 1956/04/23 DOA: 06/14/2024 PCP: Tammy Rasmussen T, PA-C  [id number]  LOS: 3 days   Brief hospital course:   Assessment & Plan: [name] is a 68 y.o. female with medical history significant for morbid obesity with BMI of 45.73, history of atrial fibrillation status post cardioversion 05/25 on chronic anticoagulation therapy, history of DVT, chronic respiratory failure on 6 L via nasal cannula and BiPAP at night, history of hypothyroidism, rheumatoid arthritis, varicose veins involving her lower extremities, history of recurrent epistaxis who presents to the emergency room from the skilled nursing facility where she resides for evaluation of change in mental status. According to the patient she does not remember the events that preceded her fall.   She was found by the nursing home staff on the floor and hypoxic with room air pulse oximetry in the 60s.    Chest x-ray reviewed by me shows persistent mixed interstitial and airspace opacification may be due to edema or pneumonia.   CT scan of the head without contrast showed no acute intracranial abnormality. No skull fracture. Mild chronic small vessel ischemia. Mucosal thickening with high density within the right maxillary sinus, however no evidence of facial bone fracture. This may be related to chronic sinusitis, including fungal infection. Near complete opacification of left mastoid air cells, partial opacification of right mastoid air cells.   Cervical spine CT shows moderate diffuse chronic degenerative disc disease and facet arthropathy without evidence of acute traumatic injury. Emphysema.   Left ankle x-ray showed no acute osseous abnormality. Degenerative changes in the ankle joint.   Left knee x-ray showed  no acute findings. Left knee osteoarthritis, worst in the medial compartment.   Acute on chronic hypoxic and hypercapnic respiratory failure (HCC) 6L O2 at  baseline --found with decreased responsiveness on room air pulse oximetry in the 60s --VBG showed uncompensated respiratory acidosis. --Continue supplemental O2 to keep sats >=90%, wean as tolerated --BiPAP nightly   Acute on chronic heart failure with preserved ejection fraction (HFpEF) (HCC) -- LVEF of 50 to 55% from a 2D echocardiogram which was done 06/25 --cont IV lasix  40 BID  Laceration of left leg  status post suture in the ER --curbsided Gen surgery-- recommends empiric antibiotic.  --dressing change with xerofoam --cont Bactrim for total 7 days -- suture to be removed in two weeks    ABLA (acute blood loss anemia) history of chronic anemia --Secondary to left lower extremity laceration following a fall in the setting of chronic anticoagulant use and varicose veins --s/p 2u pRBC -- iron supplement --3rd unit of pRBC today for Hgb 6.9   Atrial fibrillation (HCC) --cont amio and cardizem  --hold Xarelto  since still dropping Hgb requiring blood transfusion   Obesity, Class III, BMI 40-49.9 (morbid obesity) --Complicates overall prognosis and care   Rheumatoid arthritis (HCC) --Continue prednisone  and hydroxychloroquine    Hypothyroidism --Continue Synthroid   Hypokalemia --monitor and supplement PRN    DVT prophylaxis: Lovenox  SQ Code Status: Full code  Family Communication:  Level of care: Med-Surg Dispo:   The patient is from: SNF Anticipated d/c is to: SNF Anticipated d/c date is: 2-3 days   Subjective and Interval History:  Pt complained of pain in her left knee.   Objective: Vitals:   06/17/24 1600 06/17/24 1945 06/17/24 1946 06/17/24 2018  BP: (!) 93/54     Pulse: 72   80  Resp: 15   20  Temp:  TempSrc:      SpO2: 98% (!) 81% 100% 97%  Weight:      Height:        Intake/Output Summary (Last 24 hours) at 06/17/2024 2052 Last data filed at 06/17/2024 1945 Gross per 24 hour  Intake 918.33 ml  Output 2450 ml  Net -1531.67 ml   Filed  Weights   06/14/24 0938  Weight: 113.4 kg    Examination:   Constitutional: NAD, AAOx3 HEENT: conjunctivae and lids normal, EOMI CV: No cyanosis.   RESP: on BiPAP Extremities: laceration, severe bruising and swelling around left knee SKIN: warm, dry Neuro: II - XII grossly intact.   Psych: Normal mood and affect.  Appropriate judgement and reason      Data Reviewed: I have personally reviewed labs and imaging studies  Time spent: 50 minutes  Ellouise Haber, MD [facility] If 7PM-7AM, please contact night-coverage 06/17/2024, 8:52 PM

## 2024-06-17 NOTE — NC FL2 (Signed)
 [place]  MEDICAID FL2 LEVEL OF CARE FORM     IDENTIFICATION  Patient Name: [name] Birthdate: [birth year] Sex: female Admission Date (Current Location): 06/14/2024  [facility] and [place] Number:  Chiropodist and Address:  [facility], [address] [place], [place] [address]      Provider Number: [id number]  Attending Physician Name and Address:  Awanda City, MD  Relative Name and Phone Number:  King,(DIL)Jennifer (Daughter)  [phone] (Mobile)    Current Level of Care: Hospital Recommended Level of Care: Skilled Nursing Facility Prior Approval Number:    Date Approved/Denied:   PASRR Number: [id number]  Discharge Plan: SNF    Current Diagnoses: Patient Active Problem List   Diagnosis Date Noted   Acute hypoxic on chronic hypercapnic respiratory failure (HCC) 06/14/2024   ABLA (acute blood loss anemia) 06/14/2024   Laceration of left leg 06/14/2024   Fall 06/14/2024   Hypercapnic respiratory failure (HCC) 05/31/2024   Acute hypoxic respiratory failure (HCC) 05/30/2024   History of pulmonary embolism 05/21/2024   Obesity, Class III, BMI 40-49.9 (morbid obesity) 05/21/2024   Atrial fibrillation (HCC) 05/21/2024   Chronic anticoagulation 05/21/2024   Acute on chronic respiratory failure with hypoxia (HCC) 05/21/2024   Epistaxis 05/21/2024   History of DVT (deep vein thrombosis) 05/21/2024   CHF exacerbation (HCC) 05/21/2024   Acute on chronic heart failure with preserved ejection fraction (HFpEF) (HCC) 05/20/2024   Type 2 diabetes mellitus without complication, without long-term current use of insulin  (HCC) 05/28/2023   OSA on CPAP 05/29/2022   COVID-19 01/05/2021   Hypoxia    Chronic obstructive pulmonary disease (HCC)    Mitral valve annular calcification 04/07/2018   Depression 04/07/2018   Diastolic heart failure (HCC) 02/10/2018   Family history of hemophilia B 12/02/2017   Healthcare maintenance 05/07/2017    Onychomycosis 05/07/2017   Tricompartment osteoarthritis of both knees 02/19/2017   Hyperlipidemia 11/14/2016   AKI (acute kidney injury) (HCC) 04/23/2015   Rheumatoid arthritis (HCC) 07/08/2008   OBESITY 05/04/2008   Essential hypertension 05/04/2008   ALLERGIC RHINITIS 05/04/2008   GERD 05/04/2008   VARICOSE VEINS LOWER EXTREMITIES W/INFLAMMATION 11/02/2007   Hypothyroidism 08/18/2007    Orientation RESPIRATION BLADDER Height & Weight     Self, Time, Situation, Place  O2 Incontinent Weight: 113.4 kg Height:  5' 2 (157.5 cm)  BEHAVIORAL SYMPTOMS/MOOD NEUROLOGICAL BOWEL NUTRITION STATUS  Other (Comment) (n/a)  (n/a) Incontinent Diet (Heart healthy/ Carb Modififed)  AMBULATORY STATUS COMMUNICATION OF NEEDS Skin   Limited Assist Verbally                         Personal Care Assistance Level of Assistance  Bathing, Feeding, Dressing Bathing Assistance: Limited assistance Feeding assistance: Limited assistance Dressing Assistance: Limited assistance     Functional Limitations Info  Sight, Hearing Sight Info: Adequate Hearing Info: Adequate      SPECIAL CARE FACTORS FREQUENCY                       Contractures Contractures Info: Not present    Additional Factors Info  Code Status, Allergies Code Status Info: FULL Allergies Info: Latex, Oxycodone -acetaminophen , Penicillins, Strawberry Extract, Oxycodone -acetaminophen , Tyloxapol, Adhesive (Tape), Wound Dressing Adhesive           Current Medications (06/17/2024):  This is the current hospital active medication list Current Facility-Administered Medications  Medication Dose Route Frequency Provider Last Rate Last Admin   0.9 %  sodium chloride  infusion (Manually program via Guardrails IV Fluids)   Intravenous Once Awanda City, MD       acetaminophen  (TYLENOL ) tablet 500 mg  500 mg Oral Q8H PRN Patel, Kishan S, RPH   500 mg at 06/17/24 9177   amiodarone  (PACERONE ) tablet 200 mg  200 mg Oral Daily Agbata,  Tochukwu, MD   200 mg at 06/17/24 0920   atorvastatin  (LIPITOR) tablet 10 mg  10 mg Oral QHS Agbata, Tochukwu, MD   10 mg at 06/16/24 2112   budesonide  (PULMICORT ) nebulizer solution 0.5 mg  0.5 mg Nebulization BID Agbata, Tochukwu, MD   0.5 mg at 06/17/24 0746   cyanocobalamin (VITAMIN B12) tablet 1,000 mcg  1,000 mcg Oral Daily Agbata, Tochukwu, MD   1,000 mcg at 06/17/24 0919   diltiazem  (CARDIZEM  CD) 24 hr capsule 240 mg  240 mg Oral Daily Agbata, Tochukwu, MD   240 mg at 06/17/24 0919   ferrous sulfate  tablet 325 mg  325 mg Oral BID WC Patel, Sona, MD   325 mg at 06/17/24 9177   folic acid  (FOLVITE ) tablet 1 mg  1 mg Oral Daily Agbata, Tochukwu, MD   1 mg at 06/17/24 9080   furosemide  (LASIX ) injection 40 mg  40 mg Intravenous BID Agbata, Tochukwu, MD   40 mg at 06/17/24 9177   gabapentin  (NEURONTIN ) capsule 400 mg  400 mg Oral TID Agbata, Tochukwu, MD   400 mg at 06/17/24 0919   hydroxychloroquine  (PLAQUENIL ) tablet 200 mg  200 mg Oral Daily Agbata, Tochukwu, MD   200 mg at 06/17/24 0919   ipratropium-albuterol  (DUONEB) 0.5-2.5 (3) MG/3ML nebulizer solution 3 mL  3 mL Nebulization Q6H Agbata, Tochukwu, MD   3 mL at 06/17/24 0746   levothyroxine  (SYNTHROID ) tablet 150 mcg  150 mcg Oral Q0600 Agbata, Tochukwu, MD   150 mcg at 06/17/24 0539   melatonin tablet 5 mg  5 mg Oral QHS Agbata, Tochukwu, MD   5 mg at 06/16/24 2111   montelukast  (SINGULAIR ) tablet 10 mg  10 mg Oral Daily Agbata, Tochukwu, MD   10 mg at 06/17/24 0919   morphine  (PF) 2 MG/ML injection 2 mg  2 mg Intravenous Q4H PRN Mansy, Jan A, MD       multivitamin with minerals tablet 1 tablet  1 tablet Oral Daily Patel, Sona, MD   1 tablet at 06/17/24 0920   ondansetron  (ZOFRAN ) tablet 4 mg  4 mg Oral Q6H PRN Agbata, Tochukwu, MD       Or   ondansetron  (ZOFRAN ) injection 4 mg  4 mg Intravenous Q6H PRN Agbata, Tochukwu, MD       pantoprazole  (PROTONIX ) EC tablet 40 mg  40 mg Oral Daily Agbata, Tochukwu, MD   40 mg at 06/17/24 0919    predniSONE  (DELTASONE ) tablet 5 mg  5 mg Oral Q breakfast Agbata, Tochukwu, MD   5 mg at 06/17/24 9175   rivaroxaban  (XARELTO ) tablet 20 mg  20 mg Oral Daily Patel, Sona, MD   20 mg at 06/17/24 0919   senna-docusate (Senokot-S) tablet 2 tablet  2 tablet Oral BID Agbata, Tochukwu, MD   2 tablet at 06/16/24 0904   sertraline  (ZOLOFT ) tablet 75 mg  75 mg Oral Daily Agbata, Tochukwu, MD   75 mg at 06/17/24 0919   sulfamethoxazole-trimethoprim (BACTRIM DS) 800-160 MG per tablet 1 tablet  1 tablet Oral Q12H Patel, Sona, MD   1 tablet at 06/17/24 0920   traMADol  (ULTRAM ) tablet 25 mg  25  mg Oral Daily PRN Agbata, Tochukwu, MD   25 mg at 06/16/24 2112     Discharge Medications: Please see discharge summary for a list of discharge medications.  Relevant Imaging Results:  Relevant Lab Results:   Additional Information SSN: [id number]  Reshad Saab C Wylan Gentzler, RN

## 2024-06-17 NOTE — Progress Notes (Signed)
 PHARMACIST - PHYSICIAN COMMUNICATION  CONCERNING:  Enoxaparin  (Lovenox ) for DVT Prophylaxis    RECOMMENDATION: Patient was prescribed enoxaprin 40mg  q24 hours for VTE prophylaxis.   Filed Weights   06/14/24 0938  Weight: 113.4 kg (250 lb)    Body mass index is 45.73 kg/m.  Estimated Creatinine Clearance: 68.6 mL/min (by C-G formula based on SCr of 0.96 mg/dL).   Based on [facility] policy patient is candidate for enoxaparin  0.5mg /kg TBW SQ every 24 hours based on BMI being >30.  DESCRIPTION: Pharmacy has adjusted enoxaparin  dose per [facility] policy.  Patient is now receiving enoxaparin  57.5 mg every 24 hours   Mckennah Kretchmer Rodriguez-Guzman PharmD, BCPS 06/17/2024 9:04 PM

## 2024-06-18 DIAGNOSIS — Z7189 Other specified counseling: Secondary | ICD-10-CM | POA: Diagnosis not present

## 2024-06-18 DIAGNOSIS — J9601 Acute respiratory failure with hypoxia: Secondary | ICD-10-CM | POA: Diagnosis not present

## 2024-06-18 DIAGNOSIS — J9612 Chronic respiratory failure with hypercapnia: Secondary | ICD-10-CM | POA: Diagnosis not present

## 2024-06-18 LAB — BASIC METABOLIC PANEL WITH GFR
Anion gap: 11 (ref 5–15)
BUN: 19 mg/dL (ref 8–23)
CO2: 38 mmol/L — ABNORMAL HIGH (ref 22–32)
Calcium: 8.3 mg/dL — ABNORMAL LOW (ref 8.9–10.3)
Chloride: 89 mmol/L — ABNORMAL LOW (ref 98–111)
Creatinine, Ser: 1.17 mg/dL — ABNORMAL HIGH (ref 0.44–1.00)
GFR, Estimated: 51 mL/min — ABNORMAL LOW (ref >60)
Glucose, Bld: 88 mg/dL (ref 70–99)
Potassium: 3.9 mmol/L (ref 3.5–5.1)
Sodium: 138 mmol/L (ref 135–145)

## 2024-06-18 LAB — GLUCOSE, CAPILLARY
Glucose-Capillary: 85 mg/dL (ref 70–99)
Glucose-Capillary: 106 mg/dL — ABNORMAL HIGH (ref 70–99)
Glucose-Capillary: 111 mg/dL — ABNORMAL HIGH (ref 70–99)
Glucose-Capillary: 128 mg/dL — ABNORMAL HIGH (ref 70–99)

## 2024-06-18 LAB — CBC
HCT: 26 % — ABNORMAL LOW (ref 36.0–46.0)
Hemoglobin: 7.9 g/dL — ABNORMAL LOW (ref 12.0–15.0)
MCH: 28.7 pg (ref 26.0–34.0)
MCHC: 30.4 g/dL (ref 30.0–36.0)
MCV: 94.5 fL (ref 80.0–100.0)
Platelets: 148 K/uL — ABNORMAL LOW (ref 150–400)
RBC: 2.75 MIL/uL — ABNORMAL LOW (ref 3.87–5.11)
RDW: 18.9 % — ABNORMAL HIGH (ref 11.5–15.5)
WBC: 6.3 K/uL (ref 4.0–10.5)
nRBC: 0 % (ref 0.0–0.2)

## 2024-06-18 LAB — MAGNESIUM: Magnesium: 1.5 mg/dL — ABNORMAL LOW (ref 1.7–2.4)

## 2024-06-18 MED ORDER — POTASSIUM CHLORIDE CRYS ER 20 MEQ PO TBCR
40.0000 meq | EXTENDED_RELEASE_TABLET | Freq: Once | ORAL | Status: AC
  Administered 2024-06-18: 40 meq via ORAL
  Filled 2024-06-18: qty 2

## 2024-06-18 MED ORDER — DILTIAZEM HCL ER COATED BEADS 120 MG PO CP24
120.0000 mg | ORAL_CAPSULE | Freq: Every day | ORAL | Status: DC
  Administered 2024-06-19 – 2024-07-01 (×13): 120 mg via ORAL
  Filled 2024-06-18 (×13): qty 1

## 2024-06-18 NOTE — Progress Notes (Incomplete)
 MEWS Progress Note  Patient Details Name: [name] MRN: [id number] DOB: [birth year] Today's Date: 06/18/2024   MEWS Flowsheet Documentation:  Assess: MEWS Score Temp: 98.3 F (36.8 C) BP: 97/81 MAP (mmHg): 86 Pulse Rate: 81 ECG Heart Rate: 82 Resp: (!) 21 Level of Consciousness: Alert SpO2: 90 % O2 Device: High Flow Nasal Cannula Patient Activity (if Appropriate): In bed Heater temperature: 87.8 F (31 C) O2 Flow Rate (L/min): 10 L/min FiO2 (%): 45 % Assess: MEWS Score MEWS Temp: 0 MEWS Systolic: 1 MEWS Pulse: 0 MEWS RR: 1 MEWS LOC: 0 MEWS Score: 2 MEWS Score Color: Yellow Assess: SIRS CRITERIA SIRS Temperature : 0 SIRS Respirations : 1 SIRS Pulse: 0 SIRS WBC: 0 SIRS Score Sum : 1 SIRS Temperature : 0 SIRS Pulse: 0 SIRS Respirations : 1 SIRS WBC: 0 SIRS Score Sum : 1 Assess: if the MEWS score is Yellow or Red Were vital signs accurate and taken at a resting state?: Yes Does the patient meet 2 or more of the SIRS criteria?: Yes Does the patient have a confirmed or suspected source of infection?: Yes MEWS guidelines implemented : No, previously yellow, continue vital signs every 4 hours (some episoded of hypotension following IV lasix  use)        Rankin Arrant 06/18/2024, 1:25 PM

## 2024-06-18 NOTE — Progress Notes (Signed)
 Daily Progress Note   Patient Name: [name]       Date: 06/18/2024 DOB: 03-13-1956  Age: 68 y.o. MRN#: [id number] Attending Physician: Awanda City, MD Primary Care Physician: [name] ONEIDA DEVONNA Admit Date: 06/14/2024  Reason for Consultation/Follow-up: Establishing goals of care  Subjective: Notes and labs reviewed.  Into see patient.  Nursing is at bedside.  She is currently resting in bed at this time, getting ready to move to a different unit.  She denies complaint at this time and does not voice any questions or concerns about her current care.  She does discuss that she would like for TOC to follow-up with her facility regarding replacing her BiPAP which is broken.  Patient was clear yesterday that she would like to maintain a full code and full scope status.  Spoke with TOC who states she talked to facility.  Please see her note for further.  Length of Stay: 4  Current Medications: Scheduled Meds:   amiodarone   200 mg Oral Daily   atorvastatin   10 mg Oral QHS   budesonide   0.5 mg Nebulization BID   cyanocobalamin   1,000 mcg Oral Daily   [START ON 06/19/2024] diltiazem   120 mg Oral Daily   enoxaparin  (LOVENOX ) injection  0.5 mg/kg Subcutaneous Q24H   ferrous sulfate   325 mg Oral BID WC   folic acid   1 mg Oral Daily   gabapentin   400 mg Oral TID   hydroxychloroquine   200 mg Oral Daily   ipratropium-albuterol   3 mL Nebulization Q6H   levothyroxine   150 mcg Oral Q0600   melatonin  5 mg Oral QHS   montelukast   10 mg Oral Daily   multivitamin with minerals  1 tablet Oral Daily   pantoprazole   40 mg Oral Daily   predniSONE   5 mg Oral Q breakfast   senna-docusate  2 tablet Oral BID   sertraline   75 mg Oral Daily   sulfamethoxazole -trimethoprim   1 tablet Oral Q12H     Continuous Infusions:   PRN Meds: acetaminophen , morphine  injection, ondansetron  **OR** ondansetron  (ZOFRAN ) IV, traMADol   Physical Exam Pulmonary:     Effort: Pulmonary effort is normal.  Neurological:     Mental Status: She is alert.             Vital Signs: BP 97/81  Pulse 81   Temp 98.3 F (36.8 C)   Resp (!) 21   Ht 5' 2 (1.575 m)   Wt 113.4 kg   SpO2 90%   BMI 45.73 kg/m  SpO2: SpO2: 90 % O2 Device: O2 Device: High Flow Nasal Cannula O2 Flow Rate: O2 Flow Rate (L/min): 10 L/min  Intake/output summary:  Intake/Output Summary (Last 24 hours) at 06/18/2024 1514 Last data filed at 06/18/2024 1423 Gross per 24 hour  Intake 480 ml  Output 3250 ml  Net -2770 ml   LBM: Last BM Date : 06/17/24 Baseline Weight: Weight: 113.4 kg Most recent weight: Weight: 113.4 kg        Patient Active Problem List   Diagnosis Date Noted   Acute hypoxic on chronic hypercapnic respiratory failure (HCC) 06/14/2024   ABLA (acute blood loss anemia) 06/14/2024   Laceration of left leg 06/14/2024   Fall 06/14/2024   Hypercapnic respiratory failure (HCC) 05/31/2024   Acute hypoxic respiratory failure (HCC) 05/30/2024   History of pulmonary embolism 05/21/2024   Obesity, Class III, BMI 40-49.9 (morbid obesity) 05/21/2024   Atrial fibrillation (HCC) 05/21/2024   Chronic anticoagulation 05/21/2024   Acute on chronic respiratory failure with hypoxia (HCC) 05/21/2024   Epistaxis 05/21/2024   History of DVT (deep vein thrombosis) 05/21/2024   CHF exacerbation (HCC) 05/21/2024   Acute on chronic heart failure with preserved ejection fraction (HFpEF) (HCC) 05/20/2024   Type 2 diabetes mellitus without complication, without long-term current use of insulin  (HCC) 05/28/2023   OSA on CPAP 05/29/2022   COVID-19 01/05/2021   Hypoxia    Chronic obstructive pulmonary disease (HCC)    Mitral valve annular calcification 04/07/2018   Depression 04/07/2018   Diastolic heart failure (HCC)  97/73/7980   Family history of hemophilia B 12/02/2017   Healthcare maintenance 05/07/2017   Onychomycosis 05/07/2017   Tricompartment osteoarthritis of both knees 02/19/2017   Hyperlipidemia 11/14/2016   AKI (acute kidney injury) (HCC) 04/23/2015   Rheumatoid arthritis (HCC) 07/08/2008   OBESITY 05/04/2008   Essential hypertension 05/04/2008   ALLERGIC RHINITIS 05/04/2008   GERD 05/04/2008   VARICOSE VEINS LOWER EXTREMITIES W/INFLAMMATION 11/02/2007   Hypothyroidism 08/18/2007    Palliative Care Assessment & Plan    Recommendations/Plan: Full code and full scope. PMT will sign off as goals have been very clearly set.  Please reconsult if needs arise.  Code Status:    Code Status Orders  (From admission, onward)           Start     Ordered   06/14/24 1356  Full code  Continuous       Question:  By:  Answer:  Consent: discussion documented in EHR   06/14/24 1357           Code Status History     Date Active Date Inactive Code Status Order ID Comments User Context   05/30/2024 1732 06/08/2024 2354 Full Code [id number]  Leesa Kast, DO ED   05/21/2024 0059 05/25/2024 0022 Full Code [id number]  Cleatus Delayne GAILS, MD ED   01/05/2021 2116 01/10/2021 1947 Full Code [id number]  Edelmiro Mungo, MD ED   01/05/2021 2116 01/05/2021 2116 Full Code [id number]  Edelmiro Mungo, MD ED   08/04/2017 1729 08/06/2017 1940 Full Code [id number]  Eletha Boas, MD Inpatient   11/15/2016 0009 11/15/2016 2305 Full Code [id number]  Prentiss Barter, DO ED   04/23/2015 2201 04/25/2015 1624 Full Code [id number]  Drusilla Sabas RAMAN, MD Inpatient      Advance  Directive Documentation    Flowsheet Row Most Recent Value  Type of Advance Directive Out of facility DNR (pink MOST or yellow form)  Pre-existing out of facility DNR order (yellow form or pink MOST form) --  MOST Form in Place? --    Thank you for allowing the Palliative Medicine Team to assist in the care of this patient.    Camelia Lewis,  NP  Please contact Palliative Medicine Team phone at [phone] for questions and concerns.

## 2024-06-18 NOTE — TOC Initial Note (Addendum)
 Transition of Care [facility]) - Initial/Assessment Note    Patient Details  Name: [name] MRN: [id number] Date of Birth: 09-08-1956  Transition of Care [facility]) CM/SW Contact:    Lauraine JAYSON Carpen, LCSW Phone Number: 06/18/2024, 11:54 AM  Clinical Narrative:  Patient admitted from [facility]. Per admissions coordinator, she is on the LTC side but is receiving rehab. LTC Medicaid is pending. CSW called daughter-in-law and confirmed plan to return to [facility] at discharge. About 4 years ago, patient had been living with one of her sons until he passed away. She was then placed at [facility] in [facility]. After some time, she went to [facility] in [place] and then was discharged to [facility] in [place]. No further concerns. CSW will continue to follow patient and her daughter-in-law for support and facilitate return to SNF once medically stable.              3:03 pm: Per palliative NP, patient said her bipap at [facility] is broken. CSW called the admissions coordinator who said it is brand new and definitely not broken. They wonder if she does not like her mask. CSW notified her that the current bipap settings are on the FL2. Current medical readiness date listed as Sunday. [facility] admissions coordinator confirmed they are accepting admissions on Sunday.  Expected Discharge Plan: Skilled Nursing Facility Barriers to Discharge: Continued Medical Work up   Patient Goals and CMS Choice            Expected Discharge Plan and Services     Post Acute Care Choice: Resumption of Svcs/PTA Provider Living arrangements for the past 2 months: Skilled Nursing Facility                                      Prior Living Arrangements/Services Living arrangements for the past 2 months: Skilled Nursing Facility Lives with:: Facility Resident Patient language and need for interpreter reviewed:: Yes Do you feel safe going back to the place where  you live?: Yes      Need for Family Participation in Patient Care: Yes (Comment) Care giver support system in place?: Yes (comment)   Criminal Activity/Legal Involvement Pertinent to Current Situation/Hospitalization: No - Comment as needed  Activities of Daily Living   ADL Screening (condition at time of admission) Independently performs ADLs?: No Does the patient have a NEW difficulty with bathing/dressing/toileting/self-feeding that is expected to last >3 days?: Yes (Initiates electronic notice to provider for possible OT consult) Does the patient have a NEW difficulty with getting in/out of bed, walking, or climbing stairs that is expected to last >3 days?: Yes (Initiates electronic notice to provider for possible PT consult) Does the patient have a NEW difficulty with communication that is expected to last >3 days?: No Is the patient deaf or have difficulty hearing?: No Does the patient have difficulty seeing, even when wearing glasses/contacts?: No Does the patient have difficulty concentrating, remembering, or making decisions?: No  Permission Sought/Granted Permission sought to share information with : Facility Medical sales representative, Family Supports    Share Information with NAME: Delon Novak  Permission granted to share info w AGENCY: [facility]  Permission granted to share info w Relationship: Daughter-in-law  Permission granted to share info w Contact Information: [phone]  Emotional Assessment Appearance:: Appears stated age Attitude/Demeanor/Rapport: Engaged, Gracious Affect (typically observed): Accepting, Appropriate, Calm, Pleasant Orientation: :  Oriented to Self, Oriented to Place, Oriented to  Time, Oriented to Situation Alcohol / Substance Use: Not Applicable Psych Involvement: No (comment)  Admission diagnosis:  Laceration of left lower leg, initial encounter [S81.812A] Acute on chronic respiratory failure with hypercapnia (HCC) [J96.22] Syncope,  unspecified syncope type [R55] Anemia, unspecified type [D64.9] Acute hypoxic on chronic hypercapnic respiratory failure (HCC) [J96.01, J96.12] Patient Active Problem List   Diagnosis Date Noted   Acute hypoxic on chronic hypercapnic respiratory failure (HCC) 06/14/2024   ABLA (acute blood loss anemia) 06/14/2024   Laceration of left leg 06/14/2024   Fall 06/14/2024   Hypercapnic respiratory failure (HCC) 05/31/2024   Acute hypoxic respiratory failure (HCC) 05/30/2024   History of pulmonary embolism 05/21/2024   Obesity, Class III, BMI 40-49.9 (morbid obesity) 05/21/2024   Atrial fibrillation (HCC) 05/21/2024   Chronic anticoagulation 05/21/2024   Acute on chronic respiratory failure with hypoxia (HCC) 05/21/2024   Epistaxis 05/21/2024   History of DVT (deep vein thrombosis) 05/21/2024   CHF exacerbation (HCC) 05/21/2024   Acute on chronic heart failure with preserved ejection fraction (HFpEF) (HCC) 05/20/2024   Type 2 diabetes mellitus without complication, without long-term current use of insulin  (HCC) 05/28/2023   OSA on CPAP 05/29/2022   COVID-19 01/05/2021   Hypoxia    Chronic obstructive pulmonary disease (HCC)    Mitral valve annular calcification 04/07/2018   Depression 04/07/2018   Diastolic heart failure (HCC) 02/10/2018   Family history of hemophilia B 12/02/2017   Healthcare maintenance 05/07/2017   Onychomycosis 05/07/2017   Tricompartment osteoarthritis of both knees 02/19/2017   Hyperlipidemia 11/14/2016   AKI (acute kidney injury) (HCC) 04/23/2015   Rheumatoid arthritis (HCC) 07/08/2008   OBESITY 05/04/2008   Essential hypertension 05/04/2008   ALLERGIC RHINITIS 05/04/2008   GERD 05/04/2008   VARICOSE VEINS LOWER EXTREMITIES W/INFLAMMATION 11/02/2007   Hypothyroidism 08/18/2007   PCP:  [name] DASEN, PA-C Pharmacy:   [facility] [address] [place], [place] - [address] AT [address] [facility] [address] [place] [address] Phone: [phone] Fax: [phone]  [facility] [address], [address] [place] [address] Phone: [phone] Fax: [phone]     Social Drivers of Health (SDOH) Social History: SDOH Screenings   Food Insecurity: No Food Insecurity (06/14/2024)  Housing: Low Risk  (06/14/2024)  Transportation Needs: No Transportation Needs (06/14/2024)  Utilities: Not At Risk (06/14/2024)  Financial Resource Strain: High Risk (05/24/2024)  Social Connections: Socially Isolated (06/14/2024)  Tobacco Use: Medium Risk (06/14/2024)   SDOH Interventions:     Readmission Risk Interventions     No data to display

## 2024-06-18 NOTE — Progress Notes (Signed)
 PROGRESS NOTE    [name]  FMW:[id number] DOB: Apr 22, 1956 DOA: 06/14/2024 PCP: Tammy Rasmussen T, PA-C  [id number]/[id number]  LOS: 4 days   Brief hospital course:   Assessment & Plan: [name] is a 68 y.o. female with medical history significant for morbid obesity with BMI of 45.73, history of atrial fibrillation status post cardioversion 05/25 on chronic anticoagulation therapy, history of DVT, chronic respiratory failure on 6 L via nasal cannula and BiPAP at night, history of hypothyroidism, rheumatoid arthritis, varicose veins involving her lower extremities, history of recurrent epistaxis who presents to the emergency room from the skilled nursing facility where she resides for evaluation of change in mental status. According to the patient she does not remember the events that preceded her fall.   She was found by the nursing home staff on the floor and hypoxic with room air pulse oximetry in the 60s.    Chest x-ray reviewed by me shows persistent mixed interstitial and airspace opacification may be due to edema or pneumonia.   CT scan of the head without contrast showed no acute intracranial abnormality. No skull fracture. Mild chronic small vessel ischemia. Mucosal thickening with high density within the right maxillary sinus, however no evidence of facial bone fracture. This may be related to chronic sinusitis, including fungal infection. Near complete opacification of left mastoid air cells, partial opacification of right mastoid air cells.   Cervical spine CT shows moderate diffuse chronic degenerative disc disease and facet arthropathy without evidence of acute traumatic injury. Emphysema.   Left ankle x-ray showed no acute osseous abnormality. Degenerative changes in the ankle joint.   Left knee x-ray showed  no acute findings. Left knee osteoarthritis, worst in the medial compartment.   Acute on chronic hypoxic and hypercapnic respiratory failure (HCC) 6L O2 at  baseline --found with decreased responsiveness on room air pulse oximetry in the 60s --VBG showed uncompensated respiratory acidosis. --currently on 10L with occasional desat --Continue supplemental O2 to keep sats >=90%, wean as tolerated --BiPAP nightly   Acute on chronic heart failure with preserved ejection fraction (HFpEF) (HCC) -- LVEF of 50 to 55% from a 2D echocardiogram which was done 06/25 --has been receiving IV lasix  40 BID --hold diuretics today due to Cr bump  Laceration of left leg  status post suture in the ER --curbsided Gen surgery-- recommends empiric antibiotic.  --dressing change with xerofoam --cont Bactrim  for 7 days -- suture to be removed in two weeks    ABLA (acute blood loss anemia) history of chronic anemia --Secondary to left lower extremity laceration following a fall in the setting of chronic anticoagulant use and varicose veins --s/p 3u pRBC -- iron supplement --monitor Hgb and transfuse to keep Hgb >7   Atrial fibrillation (HCC) --cont amio and cardizem  --hold Xarelto  since still dropping Hgb requiring blood transfusion   Obesity, Class III, BMI 40-49.9 (morbid obesity) --Complicates overall prognosis and care   Rheumatoid arthritis (HCC) --Continue prednisone  and hydroxychloroquine    Hypothyroidism --Continue Synthroid   Hypokalemia Hypomag --monitor and supplement PRN    DVT prophylaxis: Lovenox  SQ Code Status: Full code  Family Communication:  Level of care: Med-Surg Dispo:   The patient is from: SNF Anticipated d/c is to: SNF Anticipated d/c date is: 2-3 days   Subjective and Interval History:  Pt denied dyspnea, however, desat sometimes even on 10L.  Left knee area still painful.   Objective: Vitals:   06/18/24 1200 06/18/24 1300 06/18/24 1653 06/18/24 1730  BP: 97/81  101/65   Pulse: 81  79   Resp: (!) 21  16   Temp:   98.3 F (36.8 C)   TempSrc:   Oral   SpO2: 90% 92% (!) 85% 92%  Weight:      Height:         Intake/Output Summary (Last 24 hours) at 06/18/2024 1759 Last data filed at 06/18/2024 1423 Gross per 24 hour  Intake 480 ml  Output 2550 ml  Net -2070 ml   Filed Weights   06/14/24 0938  Weight: 113.4 kg    Examination:   Constitutional: NAD, AAOx3 HEENT: conjunctivae and lids normal, EOMI CV: No cyanosis.   RESP: normal respiratory effort, on 10L Extremities: bruising and edema over left knee SKIN: warm, dry Neuro: II - XII grossly intact.   Psych: Normal mood and affect.  Appropriate judgement and reason   Data Reviewed: I have personally reviewed labs and imaging studies  Time spent: 35 minutes  Ellouise Haber, MD [facility] If 7PM-7AM, please contact night-coverage 06/18/2024, 5:59 PM

## 2024-06-18 NOTE — [place] FL2 (Signed)
 [place]  MEDICAID FL2 LEVEL OF CARE FORM     IDENTIFICATION  Patient Name: [name] Birthdate: September 14, 1956 Sex: female Admission Date (Current Location): 06/14/2024  [place] and [place] Number:  Chiropodist and Address:  [facility], [address] [place], [place] [address]      Provider Number: [id number]  Attending Physician Name and Address:  Awanda City, MD  Relative Name and Phone Number:  King,(DIL)Jennifer (Daughter)  [phone] (Mobile)    Current Level of Care: Hospital Recommended Level of Care: Skilled Nursing Facility Prior Approval Number:    Date Approved/Denied:   PASRR Number: [id number]  Discharge Plan: SNF    Current Diagnoses: Patient Active Problem List   Diagnosis Date Noted   Acute hypoxic on chronic hypercapnic respiratory failure (HCC) 06/14/2024   ABLA (acute blood loss anemia) 06/14/2024   Laceration of left leg 06/14/2024   Fall 06/14/2024   Hypercapnic respiratory failure (HCC) 05/31/2024   Acute hypoxic respiratory failure (HCC) 05/30/2024   History of pulmonary embolism 05/21/2024   Obesity, Class III, BMI 40-49.9 (morbid obesity) 05/21/2024   Atrial fibrillation (HCC) 05/21/2024   Chronic anticoagulation 05/21/2024   Acute on chronic respiratory failure with hypoxia (HCC) 05/21/2024   Epistaxis 05/21/2024   History of DVT (deep vein thrombosis) 05/21/2024   CHF exacerbation (HCC) 05/21/2024   Acute on chronic heart failure with preserved ejection fraction (HFpEF) (HCC) 05/20/2024   Type 2 diabetes mellitus without complication, without long-term current use of insulin  (HCC) 05/28/2023   OSA on CPAP 05/29/2022   COVID-19 01/05/2021   Hypoxia    Chronic obstructive pulmonary disease (HCC)    Mitral valve annular calcification 04/07/2018   Depression 04/07/2018   Diastolic heart failure (HCC) 02/10/2018   Family history of hemophilia B 12/02/2017   Healthcare maintenance 05/07/2017    Onychomycosis 05/07/2017   Tricompartment osteoarthritis of both knees 02/19/2017   Hyperlipidemia 11/14/2016   AKI (acute kidney injury) (HCC) 04/23/2015   Rheumatoid arthritis (HCC) 07/08/2008   OBESITY 05/04/2008   Essential hypertension 05/04/2008   ALLERGIC RHINITIS 05/04/2008   GERD 05/04/2008   VARICOSE VEINS LOWER EXTREMITIES W/INFLAMMATION 11/02/2007   Hypothyroidism 08/18/2007    Orientation RESPIRATION BLADDER Height & Weight     Self, Time, Situation, Place  O2, Other (Comment) (HFNC 10 L. Weaning as tolerated. Bipap QHS: 10/5 at 45%.) Incontinent, External catheter Weight: 250 lb (113.4 kg) Height:  5' 2 (157.5 cm)  BEHAVIORAL SYMPTOMS/MOOD NEUROLOGICAL BOWEL NUTRITION STATUS  Other (Comment) (n/a)  (n/a) Incontinent Diet (Heart healthy/ Carb Modififed)  AMBULATORY STATUS COMMUNICATION OF NEEDS Skin   Extensive Assist Verbally Skin abrasions, Bruising, Other (Comment) (Erythema/redness. Wound on left pretibial: Impregnated gauze (petrolatum), ABD, Xeroform.)                       Personal Care Assistance Level of Assistance  Bathing, Feeding, Dressing Bathing Assistance: Maximum assistance Feeding assistance: Limited assistance Dressing Assistance: Maximum assistance     Functional Limitations Info  Speech Sight Info: Adequate Hearing Info: Adequate Speech Info: Adequate    SPECIAL CARE FACTORS FREQUENCY  PT (By licensed PT), OT (By licensed OT)     PT Frequency: 5 x week OT Frequency: 5 x week            Contractures Contractures Info: Not present    Additional Factors Info  Isolation Precautions Code Status Info: FULL Allergies Info: Latex, Oxycodone -acetaminophen , Penicillins, Strawberry Extract, Oxycodone -acetaminophen , Tyloxapol, Adhesive (Tape), Wound  Dressing Adhesive     Isolation Precautions Info: Contact: ESBL     Current Medications (06/18/2024):  This is the current hospital active medication list Current Facility-Administered  Medications  Medication Dose Route Frequency Provider Last Rate Last Admin   acetaminophen  (TYLENOL ) tablet 500 mg  500 mg Oral Q8H PRN Awanda City, MD       amiodarone  (PACERONE ) tablet 200 mg  200 mg Oral Daily Agbata, Tochukwu, MD   200 mg at 06/18/24 0855   atorvastatin  (LIPITOR) tablet 10 mg  10 mg Oral QHS Agbata, Tochukwu, MD   10 mg at 06/17/24 2141   budesonide  (PULMICORT ) nebulizer solution 0.5 mg  0.5 mg Nebulization BID Agbata, Tochukwu, MD   0.5 mg at 06/18/24 9270   cyanocobalamin  (VITAMIN B12) tablet 1,000 mcg  1,000 mcg Oral Daily Agbata, Tochukwu, MD   1,000 mcg at 06/18/24 0855   diltiazem  (CARDIZEM  CD) 24 hr capsule 240 mg  240 mg Oral Daily Agbata, Tochukwu, MD   240 mg at 06/18/24 0856   enoxaparin  (LOVENOX ) injection 57.5 mg  0.5 mg/kg Subcutaneous Q24H Awanda City, MD   57.5 mg at 06/17/24 2141   ferrous sulfate  tablet 325 mg  325 mg Oral BID WC Patel, Sona, MD   325 mg at 06/18/24 0844   folic acid  (FOLVITE ) tablet 1 mg  1 mg Oral Daily Agbata, Tochukwu, MD   1 mg at 06/18/24 9144   gabapentin  (NEURONTIN ) capsule 400 mg  400 mg Oral TID Agbata, Tochukwu, MD   400 mg at 06/18/24 0855   hydroxychloroquine  (PLAQUENIL ) tablet 200 mg  200 mg Oral Daily Agbata, Tochukwu, MD   200 mg at 06/18/24 0856   ipratropium-albuterol  (DUONEB) 0.5-2.5 (3) MG/3ML nebulizer solution 3 mL  3 mL Nebulization Q6H Agbata, Tochukwu, MD   3 mL at 06/18/24 0729   levothyroxine  (SYNTHROID ) tablet 150 mcg  150 mcg Oral Q0600 Agbata, Tochukwu, MD   150 mcg at 06/18/24 0527   melatonin tablet 5 mg  5 mg Oral QHS Agbata, Tochukwu, MD   5 mg at 06/17/24 2141   montelukast  (SINGULAIR ) tablet 10 mg  10 mg Oral Daily Agbata, Tochukwu, MD   10 mg at 06/18/24 0856   morphine  (PF) 2 MG/ML injection 1 mg  1 mg Intravenous BID PRN Awanda City, MD   1 mg at 06/18/24 0510   multivitamin with minerals tablet 1 tablet  1 tablet Oral Daily Patel, Sona, MD   1 tablet at 06/18/24 0855   ondansetron  (ZOFRAN ) tablet 4 mg  4 mg  Oral Q6H PRN Agbata, Tochukwu, MD       Or   ondansetron  (ZOFRAN ) injection 4 mg  4 mg Intravenous Q6H PRN Agbata, Tochukwu, MD       pantoprazole  (PROTONIX ) EC tablet 40 mg  40 mg Oral Daily Agbata, Tochukwu, MD   40 mg at 06/18/24 0855   potassium chloride  SA (KLOR-CON  M) CR tablet 40 mEq  40 mEq Oral Once Awanda City, MD       predniSONE  (DELTASONE ) tablet 5 mg  5 mg Oral Q breakfast Agbata, Tochukwu, MD   5 mg at 06/18/24 0844   senna-docusate (Senokot-S) tablet 2 tablet  2 tablet Oral BID Agbata, Tochukwu, MD   2 tablet at 06/16/24 0904   sertraline  (ZOLOFT ) tablet 75 mg  75 mg Oral Daily Agbata, Tochukwu, MD   75 mg at 06/18/24 0855   sulfamethoxazole -trimethoprim  (BACTRIM  DS) 800-160 MG per tablet 1 tablet  1 tablet Oral Q12H  Patel, Sona, MD   1 tablet at 06/18/24 0856   traMADol  (ULTRAM ) tablet 50 mg  50 mg Oral Q6H PRN Awanda City, MD   50 mg at 06/18/24 9144     Discharge Medications: Please see discharge summary for a list of discharge medications.  Relevant Imaging Results:  Relevant Lab Results:   Additional Information SSN: [id number]  Lauraine JAYSON Carpen, LCSW

## 2024-06-19 DIAGNOSIS — J9612 Chronic respiratory failure with hypercapnia: Secondary | ICD-10-CM | POA: Diagnosis not present

## 2024-06-19 DIAGNOSIS — J9601 Acute respiratory failure with hypoxia: Secondary | ICD-10-CM | POA: Diagnosis not present

## 2024-06-19 LAB — MAGNESIUM: Magnesium: 1.8 mg/dL (ref 1.7–2.4)

## 2024-06-19 LAB — CBC
HCT: 26.1 % — ABNORMAL LOW (ref 36.0–46.0)
Hemoglobin: 7.8 g/dL — ABNORMAL LOW (ref 12.0–15.0)
MCH: 28.1 pg (ref 26.0–34.0)
MCHC: 29.9 g/dL — ABNORMAL LOW (ref 30.0–36.0)
MCV: 93.9 fL (ref 80.0–100.0)
Platelets: 174 K/uL (ref 150–400)
RBC: 2.78 MIL/uL — ABNORMAL LOW (ref 3.87–5.11)
RDW: 17.8 % — ABNORMAL HIGH (ref 11.5–15.5)
WBC: 6.8 K/uL (ref 4.0–10.5)
nRBC: 0 % (ref 0.0–0.2)

## 2024-06-19 LAB — BASIC METABOLIC PANEL WITH GFR
Anion gap: 11 (ref 5–15)
BUN: 23 mg/dL (ref 8–23)
CO2: 37 mmol/L — ABNORMAL HIGH (ref 22–32)
Calcium: 8.8 mg/dL — ABNORMAL LOW (ref 8.9–10.3)
Chloride: 87 mmol/L — ABNORMAL LOW (ref 98–111)
Creatinine, Ser: 1.1 mg/dL — ABNORMAL HIGH (ref 0.44–1.00)
GFR, Estimated: 55 mL/min — ABNORMAL LOW (ref >60)
Glucose, Bld: 78 mg/dL (ref 70–99)
Potassium: 4.5 mmol/L (ref 3.5–5.1)
Sodium: 135 mmol/L (ref 135–145)

## 2024-06-19 LAB — PROCALCITONIN: Procalcitonin: 0.16 ng/mL

## 2024-06-19 MED ORDER — MORPHINE SULFATE (PF) 2 MG/ML IV SOLN
1.0000 mg | Freq: Once | INTRAVENOUS | Status: AC
  Administered 2024-06-19: 1 mg via INTRAVENOUS

## 2024-06-19 MED ORDER — PREDNISONE 20 MG PO TABS
40.0000 mg | ORAL_TABLET | Freq: Every day | ORAL | Status: AC
  Administered 2024-06-19 – 2024-06-23 (×5): 40 mg via ORAL
  Filled 2024-06-19 (×5): qty 2

## 2024-06-19 NOTE — Progress Notes (Signed)
 IV started to leak while I was pushing the morphine  that patient gets before dressing change. MD Awanda notified and ordered a once of morphine 

## 2024-06-19 NOTE — Plan of Care (Signed)
  Problem: Education: Goal: Ability to describe self-care measures that may prevent or decrease complications (Diabetes Survival Skills Education) will improve Outcome: Progressing Goal: Individualized Educational Video(s) Outcome: Progressing   Problem: Coping: Goal: Ability to adjust to condition or change in health will improve Outcome: Progressing   Problem: Fluid Volume: Goal: Ability to maintain a balanced intake and output will improve Outcome: Progressing   Problem: Health Behavior/Discharge Planning: Goal: Ability to identify and utilize available resources and services will improve Outcome: Progressing Goal: Ability to manage health-related needs will improve Outcome: Progressing   Problem: Metabolic: Goal: Ability to maintain appropriate glucose levels will improve Outcome: Progressing   Problem: Nutritional: Goal: Maintenance of adequate nutrition will improve Outcome: Progressing Goal: Progress toward achieving an optimal weight will improve Outcome: Progressing   Problem: Skin Integrity: Goal: Risk for impaired skin integrity will decrease Outcome: Progressing   Problem: Tissue Perfusion: Goal: Adequacy of tissue perfusion will improve Outcome: Progressing   Problem: Education: Goal: Knowledge of General Education information will improve Description: Including pain rating scale, medication(s)/side effects and non-pharmacologic comfort measures Outcome: Progressing   Problem: Clinical Measurements: Goal: Ability to maintain clinical measurements within normal limits will improve Outcome: Progressing Goal: Will remain free from infection Outcome: Progressing Goal: Diagnostic test results will improve Outcome: Progressing Goal: Respiratory complications will improve Outcome: Progressing Goal: Cardiovascular complication will be avoided Outcome: Progressing   Problem: Health Behavior/Discharge Planning: Goal: Ability to manage health-related needs  will improve Outcome: Progressing   Problem: Nutrition: Goal: Adequate nutrition will be maintained Outcome: Progressing   Problem: Activity: Goal: Risk for activity intolerance will decrease Outcome: Progressing   Problem: Coping: Goal: Level of anxiety will decrease Outcome: Progressing   Problem: Elimination: Goal: Will not experience complications related to bowel motility Outcome: Progressing Goal: Will not experience complications related to urinary retention Outcome: Progressing   Problem: Pain Managment: Goal: General experience of comfort will improve and/or be controlled Outcome: Progressing   Problem: Safety: Goal: Ability to remain free from injury will improve Outcome: Progressing   Problem: Skin Integrity: Goal: Risk for impaired skin integrity will decrease Outcome: Progressing

## 2024-06-19 NOTE — Plan of Care (Signed)
   Problem: Education: Goal: Ability to describe self-care measures that may prevent or decrease complications (Diabetes Survival Skills Education) will improve Outcome: Progressing Goal: Individualized Educational Video(s) Outcome: Progressing   Problem: Coping: Goal: Ability to adjust to condition or change in health will improve Outcome: Progressing

## 2024-06-19 NOTE — Progress Notes (Signed)
 PROGRESS NOTE    [name]  FMW:[id number] DOB: 09-11-1956 DOA: 06/14/2024 PCP: Tammy Rasmussen T, PA-C  [id number]/[id number]-AA  LOS: 5 days   Brief hospital course:   Assessment & Plan: [name] is a 68 y.o. female with medical history significant for morbid obesity with BMI of 45.73, history of atrial fibrillation status post cardioversion 05/25 on chronic anticoagulation therapy, history of DVT, chronic respiratory failure on 6 L via nasal cannula and BiPAP at night, history of hypothyroidism, rheumatoid arthritis, varicose veins involving her lower extremities, history of recurrent epistaxis who presents to the emergency room from the skilled nursing facility where she resides for evaluation of change in mental status. According to the patient she does not remember the events that preceded her fall.   She was found by the nursing home staff on the floor and hypoxic with room air pulse oximetry in the 60s.    Chest x-ray reviewed by me shows persistent mixed interstitial and airspace opacification may be due to edema or pneumonia.   CT scan of the head without contrast showed no acute intracranial abnormality. No skull fracture. Mild chronic small vessel ischemia. Mucosal thickening with high density within the right maxillary sinus, however no evidence of facial bone fracture. This may be related to chronic sinusitis, including fungal infection. Near complete opacification of left mastoid air cells, partial opacification of right mastoid air cells.   Cervical spine CT shows moderate diffuse chronic degenerative disc disease and facet arthropathy without evidence of acute traumatic injury. Emphysema.   Left ankle x-ray showed no acute osseous abnormality. Degenerative changes in the ankle joint.   Left knee x-ray showed  no acute findings. Left knee osteoarthritis, worst in the medial compartment.   Acute on chronic hypoxic and hypercapnic respiratory failure (HCC) COPD on 6L O2  at baseline --found with decreased responsiveness on room air pulse oximetry in the 60s --VBG showed uncompensated respiratory acidosis. --currently on 10L with occasional desat, unable to wean down --will start tx for COPD exacerbation --Continue supplemental O2 to keep sats >=90%, wean as tolerated --BiPAP nightly   Acute on chronic heart failure with preserved ejection fraction (HFpEF) (HCC) -- LVEF of 50 to 55% from a 2D echocardiogram which was done 06/25 --has been receiving IV lasix  40 BID --hold diuretic due to Cr increase  COPD exacerbation --no cough, no sputum production, however, no able to wean down from 10L.  On chronic prednisone  5 mg for RA. --start prednisone  40 mg daily --cont scheduled DuoNeb  Laceration of left leg  status post suture in the ER --curbsided Gen surgery-- recommends empiric antibiotic.  --dressing change with xerofoam --cont Bactrim  for 7 days -- suture to be removed in two weeks    ABLA (acute blood loss anemia) history of chronic anemia --Secondary to left lower extremity laceration following a fall in the setting of chronic anticoagulant use and varicose veins --s/p 3u pRBC --cont iron suppl --monitor Hgb and transfuse to keep Hgb >7   Atrial fibrillation (HCC) --cont amio and cardizem  --hold Xarelto  since still dropping Hgb requiring blood transfusion   Obesity, Class III, BMI 40-49.9 (morbid obesity) --Complicates overall prognosis and care   Rheumatoid arthritis (HCC) --Continue prednisone  and hydroxychloroquine    Hypothyroidism --Continue Synthroid   Hypokalemia Hypomag --monitor and supplement PRN    DVT prophylaxis: Lovenox  SQ Code Status: Full code  Family Communication:  Level of care: Med-Surg Dispo:   The patient is from: SNF Anticipated d/c is to: SNF Anticipated d/c  date is: undetermined   Subjective and Interval History:  Pt denied feeling of dyspnea, no cough, however, unable to wean down O2 from  10L.   Objective: Vitals:   06/18/24 1900 06/19/24 0337 06/19/24 0610 06/19/24 0745  BP: 95/65 (!) 99/46 101/63 (!) 108/57  Pulse: 73 90 80 74  Resp: 20 16  20   Temp: 98.5 F (36.9 C) 98.6 F (37 C)    TempSrc: Oral Axillary    SpO2: 92% 94%  100%  Weight:      Height:        Intake/Output Summary (Last 24 hours) at 06/19/2024 1531 Last data filed at 06/19/2024 0900 Gross per 24 hour  Intake 480 ml  Output 1050 ml  Net -570 ml   Filed Weights   06/14/24 0938  Weight: 113.4 kg    Examination:   Constitutional: NAD, AAOx3 HEENT: conjunctivae and lids normal, EOMI CV: No cyanosis.   RESP: increased RR, no wheezes, on 10L Extremities: bruising and edema over left knee SKIN: warm, dry Neuro: II - XII grossly intact.   Psych: Normal mood and affect.  Appropriate judgement and reason   Data Reviewed: I have personally reviewed labs and imaging studies  Time spent: 50 minutes  Ellouise Haber, MD [facility] If 7PM-7AM, please contact night-coverage 06/19/2024, 3:31 PM

## 2024-06-19 NOTE — TOC Progression Note (Signed)
 Transition of Care [facility]) - Progression Note    Patient Details  Name: [name] MRN: [id number] Date of Birth: 1956/05/19  Transition of [facility] CM/SW Contact  Lorraine LILLETTE Fenton, LCSW Phone Number: 06/19/2024, 9:33 AM  Clinical Narrative:    CSW spoke to [place], Admissions at [facility] they are able to accept weekend admission.  CSW agreed to reach out should pt be DC cleared this weekend.   Expected Discharge Plan: Skilled Nursing Facility Barriers to Discharge: Continued Medical Work up  Expected Discharge Plan and Services     Post Acute Care Choice: Resumption of Svcs/PTA Provider Living arrangements for the past 2 months: Skilled Nursing Facility                                       Social Determinants of Health (SDOH) Interventions SDOH Screenings   Food Insecurity: No Food Insecurity (06/14/2024)  Housing: Low Risk  (06/14/2024)  Transportation Needs: No Transportation Needs (06/14/2024)  Utilities: Not At Risk (06/14/2024)  Financial Resource Strain: High Risk (05/24/2024)  Social Connections: Socially Isolated (06/14/2024)  Tobacco Use: Medium Risk (06/14/2024)    Readmission Risk Interventions     No data to display

## 2024-06-20 DIAGNOSIS — J9612 Chronic respiratory failure with hypercapnia: Secondary | ICD-10-CM | POA: Diagnosis not present

## 2024-06-20 DIAGNOSIS — I5031 Acute diastolic (congestive) heart failure: Secondary | ICD-10-CM

## 2024-06-20 DIAGNOSIS — J9621 Acute and chronic respiratory failure with hypoxia: Secondary | ICD-10-CM | POA: Diagnosis not present

## 2024-06-20 DIAGNOSIS — J441 Chronic obstructive pulmonary disease with (acute) exacerbation: Secondary | ICD-10-CM | POA: Diagnosis not present

## 2024-06-20 DIAGNOSIS — G4733 Obstructive sleep apnea (adult) (pediatric): Secondary | ICD-10-CM

## 2024-06-20 DIAGNOSIS — I272 Pulmonary hypertension, unspecified: Secondary | ICD-10-CM

## 2024-06-20 DIAGNOSIS — J9601 Acute respiratory failure with hypoxia: Secondary | ICD-10-CM | POA: Diagnosis not present

## 2024-06-20 LAB — BPAM RBC
Blood Product Expiration Date: 202507272359
Blood Product Expiration Date: 202507272359
Blood Product Expiration Date: 202507272359
ISSUE DATE / TIME: 202506301242
ISSUE DATE / TIME: 202507010805
ISSUE DATE / TIME: 202507031142
Unit Type and Rh: 5100
Unit Type and Rh: 5100
Unit Type and Rh: 5100

## 2024-06-20 LAB — TYPE AND SCREEN
ABO/RH(D): O POS
Antibody Screen: NEGATIVE
Unit division: 0
Unit division: 0
Unit division: 0

## 2024-06-20 LAB — BASIC METABOLIC PANEL WITH GFR
Anion gap: 9 (ref 5–15)
BUN: 27 mg/dL — ABNORMAL HIGH (ref 8–23)
CO2: 37 mmol/L — ABNORMAL HIGH (ref 22–32)
Calcium: 8.8 mg/dL — ABNORMAL LOW (ref 8.9–10.3)
Chloride: 87 mmol/L — ABNORMAL LOW (ref 98–111)
Creatinine, Ser: 1.19 mg/dL — ABNORMAL HIGH (ref 0.44–1.00)
GFR, Estimated: 50 mL/min — ABNORMAL LOW (ref >60)
Glucose, Bld: 99 mg/dL (ref 70–99)
Potassium: 4.8 mmol/L (ref 3.5–5.1)
Sodium: 133 mmol/L — ABNORMAL LOW (ref 135–145)

## 2024-06-20 LAB — MAGNESIUM: Magnesium: 1.9 mg/dL (ref 1.7–2.4)

## 2024-06-20 LAB — HEMOGLOBIN: Hemoglobin: 7.8 g/dL — ABNORMAL LOW (ref 12.0–15.0)

## 2024-06-20 MED ORDER — REVEFENACIN 175 MCG/3ML IN SOLN
175.0000 ug | Freq: Every day | RESPIRATORY_TRACT | Status: DC
  Administered 2024-06-20 – 2024-07-01 (×12): 175 ug via RESPIRATORY_TRACT
  Filled 2024-06-20 (×12): qty 3

## 2024-06-20 MED ORDER — ARFORMOTEROL TARTRATE 15 MCG/2ML IN NEBU
15.0000 ug | INHALATION_SOLUTION | Freq: Two times a day (BID) | RESPIRATORY_TRACT | Status: DC
  Administered 2024-06-20 – 2024-07-01 (×20): 15 ug via RESPIRATORY_TRACT
  Filled 2024-06-20 (×25): qty 2

## 2024-06-20 NOTE — Plan of Care (Signed)

## 2024-06-20 NOTE — Progress Notes (Signed)
 PROGRESS NOTE    [name]  FMW:[id number] DOB: [birth year] DOA: 06/14/2024 PCP: [name] DASEN, PA-C  246A/[id number]-AA  LOS: 6 days   Brief hospital course:   Assessment & Plan: HELIA HAESE is a 68 y.o. female with medical history significant for morbid obesity with BMI of 45.73, history of atrial fibrillation status post cardioversion 05/25 on chronic anticoagulation therapy, history of DVT, chronic respiratory failure on 6 L via nasal cannula and BiPAP at night, history of hypothyroidism, rheumatoid arthritis, varicose veins involving her lower extremities, history of recurrent epistaxis who presents to the emergency room from the skilled nursing facility where she resides for evaluation of change in mental status. According to the patient she does not remember the events that preceded her fall.   She was found by the nursing home staff on the floor and hypoxic with room air pulse oximetry in the 60s.    Chest x-ray reviewed by me shows persistent mixed interstitial and airspace opacification may be due to edema or pneumonia.   CT scan of the head without contrast showed no acute intracranial abnormality. No skull fracture. Mild chronic small vessel ischemia. Mucosal thickening with high density within the right maxillary sinus, however no evidence of facial bone fracture. This may be related to chronic sinusitis, including fungal infection. Near complete opacification of left mastoid air cells, partial opacification of right mastoid air cells.   Cervical spine CT shows moderate diffuse chronic degenerative disc disease and facet arthropathy without evidence of acute traumatic injury. Emphysema.   Left ankle x-ray showed no acute osseous abnormality. Degenerative changes in the ankle joint.   Left knee x-ray showed  no acute findings. Left knee osteoarthritis, worst in the medial compartment.   Acute on chronic hypoxic and hypercapnic respiratory failure (HCC) COPD on 6L O2  at baseline --found with decreased responsiveness on room air pulse oximetry in the 60s --VBG showed uncompensated respiratory acidosis. --currently on 12L with occasional desat, unable to wean down.  Started on tx for COPD exacerbation --pulm consult today --Continue supplemental O2 to keep sats >=90%, wean as tolerated --BiPAP nightly   Acute on chronic heart failure with preserved ejection fraction (HFpEF) (HCC) -- LVEF of 50 to 55% from a 2D echocardiogram which was done 06/25 --has been receiving IV lasix  40 BID --hold diuretics for now due to Cr increase  COPD exacerbation --no cough, no sputum production, however, no able to wean down from 12L.  On chronic prednisone  5 mg for RA. --cont prednisone  40 mg daily --cont scheduled DuoNeb --pulm consult today  Laceration of left leg  status post suture in the ER --curbsided Gen surgery-- recommends empiric antibiotic.  --dressing change with xerofoam --cont Bactrim  for 7 days -- suture to be removed in two weeks    ABLA (acute blood loss anemia) history of chronic anemia --Secondary to left lower extremity laceration following a fall in the setting of chronic anticoagulant use and varicose veins --s/p 3u pRBC --cont iron suppl --monitor Hgb and transfuse to keep Hgb >7   Atrial fibrillation (HCC) --cont amio and cardizem  --hold Xarelto  since still dropping Hgb requiring blood transfusion   Obesity, Class III, BMI 40-49.9 (morbid obesity) --Complicates overall prognosis and care   Rheumatoid arthritis (HCC) --Continue prednisone  and hydroxychloroquine    Hypothyroidism --Continue Synthroid   Hypokalemia Hypomag --monitor and supplement PRN    DVT prophylaxis: Lovenox  SQ Code Status: Full code  Family Communication:  Level of care: Progressive Dispo:   The patient is  from: SNF Anticipated d/c is to: SNF Anticipated d/c date is: undetermined   Subjective and Interval History:  Pt continued to have high O2  requirement and frequently wants to be on BiPAP.  Left knee pain unchanged.   Objective: Vitals:   06/20/24 0400 06/20/24 0735 06/20/24 1100 06/20/24 1542  BP: (!) 102/35  94/75 (!) 93/58  Pulse: 67     Resp:      Temp:  97.7 F (36.5 C) (!) 97.4 F (36.3 C) 98.9 F (37.2 C)  TempSrc:  Axillary Axillary Oral  SpO2: 96%     Weight:      Height:        Intake/Output Summary (Last 24 hours) at 06/20/2024 1657 Last data filed at 06/20/2024 1543 Gross per 24 hour  Intake 480 ml  Output 1400 ml  Net -920 ml   Filed Weights   06/14/24 0938  Weight: 113.4 kg    Examination:   Constitutional: NAD, AAOx3 HEENT: conjunctivae and lids normal, EOMI CV: No cyanosis.   RESP: increased RR, on 12L Extremities: bruising and swelling around left knee SKIN: warm, dry Neuro: II - XII grossly intact.   Psych: Normal mood and affect.  Appropriate judgement and reason   Data Reviewed: I have personally reviewed labs and imaging studies  Time spent: 35 minutes  Ellouise Haber, MD [facility] If 7PM-7AM, please contact night-coverage 06/20/2024, 4:57 PM

## 2024-06-20 NOTE — Consult Note (Signed)
 NAME:  MORAYO LEVEN, MRN:  [id number], DOB:  02-Nov-1956, LOS: 6 ADMISSION DATE:  06/14/2024, CONSULTATION DATE:  06/20/2024 REFERRING MD:  Ellouise Haber MD, CHIEF COMPLAINT:  Acute on chornic hypoxic respiratory failure   History of Present Illness:  Arlean is a 68 year old female patient with a past medical history of morbid obesity, COPD complicated by chronic hypoxic respiratory failure [facility] nasal cannula at baseline, OSA on CPAP, OHS, heart failure with preserved EF with grade 2 diastolic dysfunction presented to [facility] 6 days ago with altered mental status and a fall complicated by left knee laceration status post suturing in the ED.  Course complicated by acute on chronic hypoxic respiratory failure requiring 10 to 12 L nasal cannula from a baseline of 6 L.  Labs are unremarkable with kidney function at baseline, hemoglobin stable at 7.8 g/dL.  CT chest with contrast showing dependent nodular density in the right upper lobe suspicious for an infectious process.  Also shows chronic right lower lobe atelectasis with elevation of the right hemidiaphragm.  Echocardiogram 05/31/2024 LVEF 55 to 60%.  Grade 2 diastolic dysfunction.  RV function is normal.  Moderate mitral valve regurgitation noted.  Trivial tricuspid regurgitation.  She was treated with systemic steroids and duonebs. She was aslo adequately diuresed and currently is -7L.   Pertinent  Medical History  As above  Objective    Blood pressure 94/75, pulse 67, temperature (!) 97.4 F (36.3 C), temperature source Axillary, resp. rate 18, height 5' 2 (1.575 m), weight 113.4 kg, SpO2 96%.    FiO2 (%):  [50 %] 50 % PEEP:  [5 cmH20] 5 cmH20 Pressure Support:  [10 cmH20] 10 cmH20   Intake/Output Summary (Last 24 hours) at 06/20/2024 1540 Last data filed at 06/20/2024 1148 Gross per 24 hour  Intake 240 ml  Output 1200 ml  Net -960 ml   Filed Weights   06/14/24 0938  Weight: 113.4 kg    Examination: General:  Morbidly obese, on 12L Nasal cannula, no accute distress HENT: Supple neck reactive pupils EOMI Lungs: Diminished air entry bilaterally  Cardiovascular: Normal S1, Normal S2, irregularly irregular rhythm Abdomen: Obese, soft  Extremities: +1 LE edema right greater than left.   Labs and imaging were reviewed.   Assessment and Plan  Alysiana is a 68 year old female patient with a past medical history of morbid obesity, COPD complicated by chronic hypoxic respiratory failure [facility] nasal cannula at baseline, OSA on CPAP, OHS, heart failure with preserved EF with grade 2 diastolic dysfunction presented to [facility] 6 days ago with altered mental status and a fall complicated by left knee laceration status post suturing in the ED.  Course complicated by acute on chronic hypoxic respiratory failure requiring 10 to 12 L nasal cannula from a baseline of 6 L.  #Acute on chronic hypoxic respiratory failure now requiring 12L Crystal iso  #COPD exacerbation (Unclear baseline as no PFTs to review) and  #Pulmonary hypertension gp II and III NYHA IV and  #HFpEF exacerbation with volume overload however diuresed adequately  #OSA and OHS  #CT chest wo contrast with emphysema, few calcified nodules, bibasilar atelectasis and RUL nodular opacity. One can think of Amiodarone  toxicity but it is not clear to me how long she has been on that.   Recommendations: []  Agree with diuresis  []  Agree with systemic steroids, prednisone  40mg  daily.  []  Agree with Bipap at bedtime  []  Recommend IS and OOB to  chair if able.  []  Echocardiogram with Bubble study  []  Cardiology consult to consider alternative or dc amiodarone .  []  Would recommend adding Brovana  BID and Yupelri  daily.   I spent 80 minutes caring for this patient today, including preparing to see the patient, obtaining a medical history , reviewing a separately obtained history, performing a medically appropriate examination and/or evaluation, counseling  and educating the patient/family/caregiver, ordering medications, tests, or procedures, documenting clinical information in the electronic health record, and independently interpreting results (not separately reported/billed) and communicating results to the patient/family/caregiver  Darrin Barn, MD [facility] 06/20/2024 3:57 PM

## 2024-06-20 NOTE — Progress Notes (Signed)
 Patient's dressing changed. Cleansed and stitches noted. Stitches not present and skin dehisced on left lateral side of the wound. Swelling noted.   06/20/24 0600  Wound 06/14/24 1308 Other (Comment) Pretibial Left;Proximal  Date First Assessed/Time First Assessed: 06/14/24 1308   Present on Original Admission: Yes  Primary Wound Type: (c) Other (Comment)  Location: Pretibial  Location Orientation: Left;Proximal  Wound Outcome: (c) Other (Comment)  Site / Wound Assessment Painful;Purple;Pink;Red (Stitches noted, skin dehised on lateral left side)  Peri-wound Assessment Edema;Erythema (blanchable)  Drainage Description Serosanguineous  Drainage Amount Small  Treatments Cleansed  Dressing Type Gauze (Comment);Other (Comment) (Xeroform, gauze, kerlix)  Dressing Changed New  Dressing Status Clean, Dry, Intact

## 2024-06-21 DIAGNOSIS — D649 Anemia, unspecified: Secondary | ICD-10-CM | POA: Diagnosis not present

## 2024-06-21 DIAGNOSIS — J9621 Acute and chronic respiratory failure with hypoxia: Secondary | ICD-10-CM | POA: Diagnosis not present

## 2024-06-21 DIAGNOSIS — I480 Paroxysmal atrial fibrillation: Secondary | ICD-10-CM

## 2024-06-21 DIAGNOSIS — I5033 Acute on chronic diastolic (congestive) heart failure: Secondary | ICD-10-CM | POA: Diagnosis not present

## 2024-06-21 DIAGNOSIS — J9612 Chronic respiratory failure with hypercapnia: Secondary | ICD-10-CM | POA: Diagnosis not present

## 2024-06-21 DIAGNOSIS — J9601 Acute respiratory failure with hypoxia: Secondary | ICD-10-CM | POA: Diagnosis not present

## 2024-06-21 LAB — BASIC METABOLIC PANEL WITH GFR
Anion gap: 9 (ref 5–15)
BUN: 31 mg/dL — ABNORMAL HIGH (ref 8–23)
CO2: 35 mmol/L — ABNORMAL HIGH (ref 22–32)
Calcium: 9.2 mg/dL (ref 8.9–10.3)
Chloride: 89 mmol/L — ABNORMAL LOW (ref 98–111)
Creatinine, Ser: 1.13 mg/dL — ABNORMAL HIGH (ref 0.44–1.00)
GFR, Estimated: 54 mL/min — ABNORMAL LOW (ref >60)
Glucose, Bld: 105 mg/dL — ABNORMAL HIGH (ref 70–99)
Potassium: 4.9 mmol/L (ref 3.5–5.1)
Sodium: 133 mmol/L — ABNORMAL LOW (ref 135–145)

## 2024-06-21 LAB — GLUCOSE, CAPILLARY
Glucose-Capillary: 79 mg/dL (ref 70–99)
Glucose-Capillary: 100 mg/dL — ABNORMAL HIGH (ref 70–99)
Glucose-Capillary: 135 mg/dL — ABNORMAL HIGH (ref 70–99)

## 2024-06-21 LAB — HEMOGLOBIN: Hemoglobin: 7.5 g/dL — ABNORMAL LOW (ref 12.0–15.0)

## 2024-06-21 MED ORDER — IPRATROPIUM-ALBUTEROL 0.5-2.5 (3) MG/3ML IN SOLN
3.0000 mL | Freq: Three times a day (TID) | RESPIRATORY_TRACT | Status: DC
  Administered 2024-06-22 – 2024-06-24 (×7): 3 mL via RESPIRATORY_TRACT
  Filled 2024-06-21 (×7): qty 3

## 2024-06-21 MED ORDER — OXYCODONE HCL 5 MG PO TABS
5.0000 mg | ORAL_TABLET | ORAL | Status: AC | PRN
  Administered 2024-06-23 (×2): 5 mg via ORAL
  Filled 2024-06-21 (×2): qty 1

## 2024-06-21 NOTE — Consult Note (Signed)
 Cardiology Consultation   Patient ID: [name] MRN: [id number]; DOB: [birth year]  Admit date: 06/14/2024 Date of Consult: 06/21/2024  PCP:  Marilyn Tari DASEN, PA-C   [facility] Providers Cardiologist:  New, seen by [facility]   Patient Profile: [name] is a 68 y.o. female with a hx of hypoxia secondary to chronic ILD/COPD on home O2 at 6L, morbid obesity, OSA, pulmonary HTN, IDA, RA, h/o PE, paroxysmal Afib, HFpEF, varicose veins s/p laser ablation, h/o Hashimoto's thyroiditis with hypothyroidism who is being seen 06/21/2024 for the evaluation of cardiac medications at the request of Dr. Awanda.  History of Present Illness: [name] was seen 06/2017 for pre-op evaluation, referred for heart murmur. Echo showed normal LV function, mild LVH and LVE, mildly elevated LV filling pressure, mild MR, mild LAE, mild TR. She was cleared for surgery.  The patient was [facility] in [facility] for ectopy in the setting of a surgery. She was asymptomatic. EKG was abnormal, but not felt to [facility] clinical picture. Labs were normal and cardiology signed off.   In 2023 she saw [facility] cardiology for CHF/SOB. Apparently O2 tube was not working and she felt SOB and she was found to be anemic.  She was seen in May 2025 by cardiology at [facility]. She developed aflutter in the setting of exacerbation of chronic lung disease. She was placed on IV dilt and IV amiodarone . She was continued on Xarelto . She underwent successful DCCV. She was changed to oral diltiazem  and oral amiodarone .   Echo 05/31/24 showed LVEF 55-60%, G2DD, normal RVSF, mod MR, trivial TR.   The patient presented to [facility] with AMS and a fall complicated by left knee laceration s/p suturing in the ED. Course complicated by acute on chronic hypoxic respiratory failure as she was found by nursing staff on the floor hypoxic with O2 in the 60s. She was placed on 10-12L [place] from baseline 6L. CXR showed interstitial and airspace  opacification due to edema or PNA. CT chest showed dependency nodular density in the right upper lobe suspicious for infectious process, chronic right lower lobe atelectasis with elevation of right hemidiaphragm. She was given steroids and duonebs and IV lasix .   Cardiology was asked to see to determine if there was an alternative to amiodarone .    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    HTN (hypertension)    Hypercholesteremia    Hypothyroid    Rheumatoid arthritis(714.0)    Varicose vein of leg     Past Surgical History:  Procedure Laterality Date   APPENDECTOMY     CHOLECYSTECTOMY     ELBOW SURGERY     LEFT   ENDOSCOPIC VEIN LASER TREATMENT     RIGHT LEG   EXPLORATORY LAPAROTOMY     HAND SURGERY     RIGHT    HEEL SPUR SURGERY     BILAT   INSERTION OF MESH N/A 08/04/2017   Procedure: INSERTION OF MESH;  Surgeon: Eletha Boas, MD;  Location: [facility] OR;  Service: General;  Laterality: N/A;   partial thyroidectomy  2015   TONSILLECTOMY     TUBAL LIGATION     VENTRAL HERNIA REPAIR N/A 08/04/2017   Procedure: LAPAROSCOPIC VENTRAL INCISIONAL HERNIA  REPAIR WITH MESH;  Surgeon: Eletha Boas, MD;  Location: [facility] OR;  Service: General;  Laterality: N/A;     Home Medications:  Prior to Admission medications   Medication Sig Start Date End Date Taking? Authorizing Provider  acetaminophen  (TYLENOL ) 500 MG tablet Take 500 mg by mouth every 8 (eight) hours as needed for mild pain (pain score 1-3).   Yes [provider]  amiodarone  (PACERONE ) 200 MG tablet Take 200 mg by mouth daily.   Yes [provider]  atorvastatin  (LIPITOR) 10 MG tablet Take 10 mg by mouth at bedtime.   Yes [provider]  benzonatate (TESSALON) 100 MG capsule Take 100 mg by mouth every 8 (eight) hours as  needed for cough.   Yes [provider]  budesonide  (PULMICORT ) 0.5 MG/2ML nebulizer solution Take 0.5 mg by nebulization 2 (two) times daily.   Yes [provider]  Cyanocobalamin  1000 MCG TBCR Take 1 tablet by mouth daily.   Yes [provider]  diltiazem  (CARDIZEM  CD) 240 MG 24 hr capsule Take 1 capsule (240 mg total) by mouth daily. 06/08/24  Yes Wouk, Devaughn Sayres, MD  ferrous sulfate  325 (65 FE) MG EC tablet Take 325 mg by mouth daily with breakfast.   Yes [provider]  folic acid  (FOLVITE ) 1 MG tablet Take 1 mg by mouth daily.   Yes [provider]  furosemide  (LASIX ) 80 MG tablet Take 80 mg by mouth daily.   Yes [provider]  gabapentin  (NEURONTIN ) 400 MG capsule Take 400 mg by mouth 3 (three) times daily.   Yes [provider]  hydroxychloroquine  (PLAQUENIL ) 200 MG tablet Take 200 mg by mouth daily.   Yes [provider]  insulin  lispro (HUMALOG) 100 UNIT/ML injection Inject 2-10 Units into the skin 3 (three) times daily before meals. Sliding scale   Yes [provider]  ipratropium-albuterol  (DUONEB) 0.5-2.5 (3) MG/3ML SOLN Take 3 mLs by nebulization every 6 (six) hours.   Yes [provider]  levothyroxine  (SYNTHROID ) 150 MCG tablet Take 150 mcg by mouth daily before breakfast.   Yes [provider]  melatonin 5 MG TABS Take 5 mg by mouth at bedtime.   Yes [provider]  montelukast  (SINGULAIR ) 10 MG tablet Take 10 mg by mouth daily.   Yes [provider]  Multiple Vitamin (MULTIVITAMIN) tablet Take 1 tablet by mouth daily.   Yes [provider]  naloxone (NARCAN) nasal spray 4 mg/0.1 mL Place 1 spray into the nose 3 (three) times daily as needed (symptoms of opiod overdose).   Yes [provider]  oxymetazoline  (AFRIN) 0.05 % nasal spray Place 1 spray into both nostrils 2 (two) times daily. 05/24/24  Yes Wouk, Devaughn Sayres, MD  pantoprazole   (PROTONIX ) 40 MG tablet Take 40 mg by mouth daily.   Yes [provider]  predniSONE  (DELTASONE ) 5 MG tablet Take 5 mg by mouth daily with breakfast.   Yes [provider]  rivaroxaban  (XARELTO ) 20 MG TABS tablet Take 20 mg by mouth daily.   Yes [provider]  sennosides-docusate sodium  (SENOKOT-S) 8.6-50 MG tablet Take 2 tablets by mouth 2 (two) times daily.   Yes [provider]  sertraline  (ZOLOFT ) 25 MG tablet Take 25 mg by mouth daily. Takes with 50mg  tab = 75mg  daily   Yes [provider]  sertraline  (ZOLOFT ) 50 MG tablet Take 50 mg by mouth daily. Takes with 25mg  tab = 75mg  daily   Yes [provider]  traMADol  HCl 25 MG TABS Take by mouth. PRN   Yes [provider]    Scheduled Meds:  amiodarone   200 mg Oral Daily   arformoterol   15 mcg Nebulization BID   atorvastatin   10 mg Oral QHS   budesonide   0.5 mg Nebulization BID   cyanocobalamin   1,000 mcg Oral Daily   diltiazem   120 mg Oral Daily   enoxaparin  (LOVENOX ) injection  0.5 mg/kg Subcutaneous Q24H   ferrous sulfate   325 mg Oral BID WC   folic acid   1 mg Oral Daily   gabapentin   400 mg Oral TID   hydroxychloroquine   200 mg Oral Daily   ipratropium-albuterol   3 mL Nebulization Q6H   levothyroxine   150 mcg Oral Q0600   melatonin  5 mg Oral QHS   montelukast   10 mg Oral Daily   multivitamin with minerals  1 tablet Oral Daily   pantoprazole   40 mg Oral Daily   predniSONE   40 mg Oral Q breakfast   revefenacin   175 mcg Nebulization Daily   senna-docusate  2 tablet Oral BID   sertraline   75 mg Oral Daily   sulfamethoxazole -trimethoprim   1 tablet Oral Q12H   Continuous Infusions:  PRN Meds: acetaminophen , morphine  injection, ondansetron  **OR** ondansetron  (ZOFRAN ) IV, traMADol   Allergies:    Allergies  Allergen Reactions   Latex Hives, Itching and Dermatitis    Blisters (also)  Skin blisters   Oxycodone -Acetaminophen  Nausea Only and Other (See Comments)     Violent Vomiting Violent Vomiting   Penicillins Anaphylaxis, Swelling and Rash    Has patient had a PCN reaction causing immediate rash, facial/tongue/throat swelling, SOB or lightheadedness with hypotension: Yes Has patient had a PCN reaction causing severe rash involving mucus membranes or skin necrosis: No Has patient had a PCN reaction that required hospitalization No Has patient had a PCN reaction occurring within the last 10 years: No If all of the above answers are NO, then may proceed with Cephalosporin use.    Strawberry Extract Hives, Itching, Rash, Anaphylaxis, Dermatitis and Swelling   Oxycodone -Acetaminophen  Nausea And Vomiting   Tyloxapol Nausea And Vomiting   Adhesive [Tape] Rash    Patient prefers paper tape   Wound Dressing Adhesive Dermatitis    Social History:   Social History   Socioeconomic History   Marital status: Divorced    Spouse name: Not on file   Number of children: 1   Years of education: Not on file   Highest education level: Some college, no degree  Occupational History   Occupation: Nutritional therapist: [employer]   Occupation: Retired  Tobacco Use   Smoking status: Former    Current packs/day: 0.00    Average packs/day: 1 pack/day for 40.0 years (40.0 ttl pk-yrs)    Types: Cigarettes    Start date: 53    Quit date: 2021    Years since quitting: 4.5   Smokeless tobacco: Never   Tobacco comments:    < 1/2 pack/day  Vaping Use   Vaping status: Never Used  Substance and Sexual Activity   Alcohol use: No   Drug use: No   Sexual activity: Never  Other Topics Concern   Not on file  Social History Narrative   Not on file   Social Drivers of Health   Financial Resource Strain: High Risk (05/24/2024)   Overall Financial Resource Strain (CARDIA)    Difficulty of Paying Living Expenses: Very hard  Food Insecurity: No Food  Insecurity (06/14/2024)   Hunger Vital Sign    Worried About Running Out of Food in the Last Year: Never  true    Ran Out of Food in the Last Year: Never true  Transportation Needs: No Transportation Needs (06/14/2024)   PRAPARE - Administrator, Civil Service (Medical): No    Lack of Transportation (Non-Medical): No  Physical Activity: Not on file  Stress: Not on file  Social Connections: Socially Isolated (06/14/2024)   Social Connection and Isolation Panel    Frequency of Communication with Friends and Family: More than three times a week    Frequency of Social Gatherings with Friends and Family: More than three times a week    Attends Religious Services: Never    Database administrator or Organizations: No    Attends Banker Meetings: Never    Marital Status: Divorced  Catering manager Violence: Not At Risk (06/14/2024)   Humiliation, Afraid, Rape, and Kick questionnaire    Fear of Current or Ex-Partner: No    Emotionally Abused: No    Physically Abused: No    Sexually Abused: No    Family History:    Family History  Problem Relation Age of Onset   Diabetes Mother    Kidney disease Mother    Lung cancer Father    Alzheimer's disease Maternal Grandmother    Stroke Maternal Grandfather    Heart disease Paternal Grandmother    Unexplained death Paternal Grandfather      ROS:  Please see the history of present illness.   All other ROS reviewed and negative.     Physical Exam/Data: Vitals:   06/20/24 2032 06/21/24 0746 06/21/24 1000 06/21/24 1014  BP: 117/63  (!) 137/101 (!) 137/101  Pulse:  (!) 58    Resp: 20 15 (!) 22   Temp: 97.9 F (36.6 C)  97.8 F (36.6 C)   TempSrc:      SpO2: 92% 99% 100%   Weight:      Height:        Intake/Output Summary (Last 24 hours) at 06/21/2024 1151 Last data filed at 06/21/2024 1133 Gross per 24 hour  Intake 240 ml  Output 2300 ml  Net -2060 ml      06/14/2024    9:38 AM 06/13/2024    5:25 PM 05/30/2024    2:34 PM  Last 3 Weights  Weight (lbs) 250 lb 250 lb 240 lb 11.2 oz  Weight (kg) 113.399 kg 113.399  kg 109.181 kg     Body mass index is 45.73 kg/m.  General:  Well nourished, well developed, in no acute distress HEENT: normal Neck: no JVD Vascular: No carotid bruits; Distal pulses 2+ bilaterally Cardiac:  normal S1, S2; RRR; no murmur  Lungs:  diffusely diminished  Abd: soft, nontender, no hepatomegaly  Ext: no edema Musculoskeletal:  No deformities, BUE and BLE strength normal and equal Skin: warm and dry  Neuro:  CNs 2-12 intact, no focal abnormalities noted Psych:  Normal affect   EKG:  The EKG was personally reviewed and demonstrates:  NSR, 79bpm, RBBB Telemetry:  Telemetry was personally reviewed and demonstrates:  Nsr HR 60s  Relevant CV Studies:  Echo 05/31/24  1. Left ventricular ejection fraction, by estimation, is 55 to 60%. The  left ventricle has normal function. The left ventricle has no regional  wall motion abnormalities. There is mild concentric left ventricular  hypertrophy. Left ventricular diastolic  parameters are consistent with Grade II  diastolic dysfunction  (pseudonormalization).   2. Right ventricular systolic function is normal. The right ventricular  size is normal.   3. The mitral valve is grossly normal. Moderate mitral valve  regurgitation.   4. The aortic valve is calcified. Aortic valve regurgitation is not  visualized. Aortic valve sclerosis/calcification is present, without any  evidence of aortic stenosis.   Echo 02/2018 Study Conclusions   - Left ventricle: Moderate mid and basal septal hypertrophy. The    cavity size was normal. Wall thickness was increased in a pattern    of moderate LVH. Systolic function was normal. The estimated    ejection fraction was in the range of 60% to 65%. Left    ventricular diastolic function parameters were normal.  - Mitral valve: Severely calcified annulus. Moderately thickened,    moderately calcified leaflets . There was mild regurgitation.  - Atrial septum: There was increased thickness of the  septum,    consistent with lipomatous hypertrophy. No defect or patent    foramen ovale was identified.  - Impressions: Normal GLS -17.3.    Laboratory Data: High Sensitivity Troponin:   Recent Labs  Lab 05/30/24 1425 05/30/24 1657 06/14/24 0947  TROPONINIHS 18* 21* 6     Chemistry Recent Labs  Lab 06/18/24 0344 06/19/24 0417 06/20/24 0339 06/21/24 0255  NA 138 135 133* 133*  K 3.9 4.5 4.8 4.9  CL 89* 87* 87* 89*  CO2 38* 37* 37* 35*  GLUCOSE 88 78 99 105*  BUN 19 23 27* 31*  CREATININE 1.17* 1.10* 1.19* 1.13*  CALCIUM  8.3* 8.8* 8.8* 9.2  MG 1.5* 1.8 1.9  --   GFRNONAA 51* 55* 50* 54*  ANIONGAP 11 11 9 9     No results for input(s): PROT, ALBUMIN, AST, ALT, ALKPHOS, BILITOT in the last 168 hours. Lipids No results for input(s): CHOL, TRIG, HDL, LABVLDL, LDLCALC, CHOLHDL in the last 168 hours.  Hematology Recent Labs  Lab 06/17/24 0408 06/18/24 0344 06/19/24 0417 06/20/24 0339 06/21/24 0255  WBC 5.8 6.3 6.8  --   --   RBC 2.36* 2.75* 2.78*  --   --   HGB 6.9* 7.9* 7.8* 7.8* 7.5*  HCT 23.1* 26.0* 26.1*  --   --   MCV 97.9 94.5 93.9  --   --   MCH 29.2 28.7 28.1  --   --   MCHC 29.9* 30.4 29.9*  --   --   RDW 17.5* 18.9* 17.8*  --   --   PLT 131* 148* 174  --   --    Thyroid  No results for input(s): TSH, FREET4 in the last 168 hours.  BNPNo results for input(s): BNP, PROBNP in the last 168 hours.  DDimer No results for input(s): DDIMER in the last 168 hours.  Radiology/Studies:  No results found.   Assessment and Plan:  Acute on chronic hypoxic and hypercapnic respiratory failure COPD on 6L O2 - found with decreased responsiveness with O2 in the 60s - was on 12L O2, now on BIpap - continue steroids and duonebs - pulmonology consulted and requested alternative to amiodarone   Acute on chronic diastolic heart failure - IV lasix  40mg  BID>held for worsening kidney function - UOP net -9.8L - she appears euvolemic - PTA  lasix  80mg  daily> can restart - recent echo showed normal LVEF 55-60%, no WMA, G2DD, mild LVH, mod MR  ABLA Chronic anemia - s/p 3u PRBC - Hgb 7.5  Paroxysmal Afib - s/p DCCV 04/2024 at [facility] placed on  diltiazem  and amiodarone  200mg  daily - PTA diltiazem  240mg  daily - PTA Xarelto  20mg  daily - remains in NSR - will likely require AA to maintain SR given severe lung issues, may need EP to weigh in. - can consider tikosyn. No h/o structural heart disease. Would continue amio for now   For questions or updates, please contact [facility] Please consult [email or web address] for contact info under    Signed, Trampus Mcquerry VEAR Fishman, PA-C  06/21/2024 11:51 AM

## 2024-06-21 NOTE — Plan of Care (Signed)
  Problem: Nutritional: Goal: Maintenance of adequate nutrition will improve Outcome: Progressing   Problem: Skin Integrity: Goal: Risk for impaired skin integrity will decrease Outcome: Progressing   Problem: Activity: Goal: Risk for activity intolerance will decrease Outcome: Progressing   Problem: Nutrition: Goal: Adequate nutrition will be maintained Outcome: Progressing   Problem: Elimination: Goal: Will not experience complications related to urinary retention Outcome: Progressing   Problem: Pain Managment: Goal: General experience of comfort will improve and/or be controlled Outcome: Progressing   Problem: Safety: Goal: Ability to remain free from injury will improve Outcome: Progressing   Problem: Skin Integrity: Goal: Risk for impaired skin integrity will decrease Outcome: Progressing

## 2024-06-21 NOTE — Progress Notes (Signed)
 Physical Therapy Treatment Patient Details Name: [name] MRN: [id number] DOB: 08-16-1956 Today's Date: 06/21/2024   History of Present Illness [name] is a 68 y.o. female with medical history significant for morbid obesity with BMI of 45.73, history of atrial fibrillation status post cardioversion 05/25 on chronic anticoagulation therapy, history of DVT, chronic respiratory failure on 6 L via nasal cannula and BiPAP at night, history of hypothyroidism, rheumatoid arthritis, varicose veins involving her lower extremities, history of recurrent epistaxis who presents to the emergency room from the skilled nursing facility where she resides for evaluation of change in mental status and fall resulting in LLE laceration.    PT Comments  Patient alert, agreeable to some PT. Noted to be on 15L on HFNC, spO2 monitored throughout activity, >90%. Pt needed multimodal cues for PLB especially during activity. OOB mobility deferred due to respiratory status, to assess next session. The patient would benefit from further skilled PT intervention to continue to progress towards goals.    If plan is discharge home, recommend the following: Assistance with cooking/housework;Assist for transportation;Help with stairs or ramp for entrance;Two people to help with walking and/or transfers;Two people to help with bathing/dressing/bathroom   Can travel by private vehicle     No  Equipment Recommendations  None recommended by PT    Recommendations for Other Services       Precautions / Restrictions Precautions Precautions: Fall Recall of Precautions/Restrictions: Intact Restrictions Weight Bearing Restrictions Per Provider Order: No     Mobility  Bed Mobility               General bed mobility comments: deferred due to respiratory status    Transfers                        Ambulation/Gait                   Stairs             Wheelchair Mobility      Tilt Bed    Modified Rankin (Stroke Patients Only)       Balance                                            Communication    Cognition Arousal: Alert Behavior During Therapy: WFL for tasks assessed/performed, Anxious   PT - Cognitive impairments: No apparent impairments                         Following commands: Intact      Cueing    Exercises Other Exercises Other Exercises: SLR on LLE, glute sets, heel slides RLE, push/pull BUE x10 bilaterally    General Comments        Pertinent Vitals/Pain Pain Assessment Pain Assessment: Faces Faces Pain Scale: Hurts a little bit Pain Location: L LE, LUE with exercises Pain Descriptors / Indicators: Constant, Discomfort, Grimacing, Guarding Pain Intervention(s): Limited activity within patient's tolerance, Monitored during session, Repositioned    Home Living                          Prior Function            PT Goals (current goals can now be found in the care plan section) Progress towards PT  goals: Progressing toward goals    Frequency    Min 2X/week      PT Plan      Co-evaluation PT/OT/SLP Co-Evaluation/Treatment: Yes Reason for Co-Treatment: Complexity of the patient's impairments (multi-system involvement);For patient/therapist safety;To address functional/ADL transfers PT goals addressed during session: Mobility/safety with mobility;Balance        AM-PAC PT 6 Clicks Mobility   Outcome Measure  Help needed turning from your back to your side while in a flat bed without using bedrails?: A Little Help needed moving from lying on your back to sitting on the side of a flat bed without using bedrails?: A Lot Help needed moving to and from a bed to a chair (including a wheelchair)?: A Lot Help needed standing up from a chair using your arms (e.g., wheelchair or bedside chair)?: A Lot Help needed to walk in hospital room?: Total Help needed climbing 3-5 steps  with a railing? : Total 6 Click Score: 11    End of Session Equipment Utilized During Treatment: Oxygen   Patient left: in bed;with call bell/phone within reach;with bed alarm set Nurse Communication: Mobility status PT Visit Diagnosis: Repeated falls (R29.6);Muscle weakness (generalized) (M62.81);History of falling (Z91.81);Unsteadiness on feet (R26.81);Difficulty in walking, not elsewhere classified (R26.2);Other abnormalities of gait and mobility (R26.89)     Time: 1447-1510 PT Time Calculation (min) (ACUTE ONLY): 23 min  Charges:    $Therapeutic Exercise: 8-22 mins PT General Charges $$ ACUTE PT VISIT: 1 Visit                     Doyal Shams PT, DPT 3:54 PM,06/21/24

## 2024-06-21 NOTE — Progress Notes (Signed)
 PROGRESS NOTE    [name]  FMW:[id number] DOB: October 30, 1956 DOA: 06/14/2024 PCP: [name] DASEN, PA-C  246A/[id number]-AA  LOS: 7 days   Brief hospital course:   Assessment & Plan: [name] is a 68 y.o. female with medical history significant for morbid obesity with BMI of 45.73, history of atrial fibrillation status post cardioversion 05/25 on chronic anticoagulation therapy, history of DVT, chronic respiratory failure on 6 L via nasal cannula and BiPAP at night, history of hypothyroidism, rheumatoid arthritis, varicose veins involving her lower extremities, history of recurrent epistaxis who presents to the emergency room from the skilled nursing facility where she resides for evaluation of change in mental status. According to the patient she does not remember the events that preceded her fall.   She was found by the nursing home staff on the floor and hypoxic with room air pulse oximetry in the 60s.    Chest x-ray reviewed by me shows persistent mixed interstitial and airspace opacification may be due to edema or pneumonia.   CT scan of the head without contrast showed no acute intracranial abnormality. No skull fracture. Mild chronic small vessel ischemia. Mucosal thickening with high density within the right maxillary sinus, however no evidence of facial bone fracture. This may be related to chronic sinusitis, including fungal infection. Near complete opacification of left mastoid air cells, partial opacification of right mastoid air cells.   Cervical spine CT shows moderate diffuse chronic degenerative disc disease and facet arthropathy without evidence of acute traumatic injury. Emphysema.   Left ankle x-ray showed no acute osseous abnormality. Degenerative changes in the ankle joint.   Left knee x-ray showed  no acute findings. Left knee osteoarthritis, worst in the medial compartment.   Acute on chronic hypoxic and hypercapnic respiratory failure (HCC) COPD on 6L O2  at baseline --found with decreased responsiveness on room air pulse oximetry in the 60s --VBG showed uncompensated respiratory acidosis. --currently on 12L with occasional desat, unable to wean down.  Started on tx for COPD exacerbation --pulm consulted --cardio consult today, per pulm rec --Continue supplemental O2 to keep sats >=90%, wean as tolerated --BiPAP nightly   Acute on chronic heart failure with preserved ejection fraction (HFpEF) (HCC) -- LVEF of 50 to 55% from a 2D echocardiogram which was done 06/25 --has been receiving IV lasix  40 BID, but then diuretics held due to Cr increase --cardio consult today  COPD exacerbation --no cough, no sputum production, however, no able to wean down from 12L.  On chronic prednisone  5 mg for RA. --pulm consulted --cont prednisone  40 mg daily --cont scheduled DuoNeb --add Brovana  BID and Yupelri  daily  Laceration of left leg  status post suture in the ER --curbsided Gen surgery-- recommends empiric antibiotic.  --dressing change with xerofoam --cont Bactrim  for 7 days -- suture to be removed in two weeks    ABLA (acute blood loss anemia) history of chronic anemia --Secondary to left lower extremity laceration following a fall in the setting of chronic anticoagulant use and varicose veins --s/p 3u pRBC --cont iron suppl --monitor Hgb and transfuse to keep Hgb >7   Atrial fibrillation (HCC) --cont amio and cardizem  --hold Xarelto  since still dropping Hgb requiring blood transfusion   Obesity, Class III, BMI 40-49.9 (morbid obesity) --Complicates overall prognosis and care   Rheumatoid arthritis (HCC) --Continue prednisone  and hydroxychloroquine    Hypothyroidism --Continue Synthroid   Hypokalemia Hypomag --monitor and supplement PRN    DVT prophylaxis: Lovenox  SQ Code Status: Full code  Family Communication:  Level of care: Progressive Dispo:   The patient is from: SNF Anticipated d/c is to: SNF Anticipated d/c date  is: undetermined   Subjective and Interval History:  No change, still needing 10-12L [place] while not on BiPAP.     Objective: Vitals:   06/21/24 1509 06/21/24 1555 06/21/24 1634 06/21/24 1730  BP: 100/64   111/64  Pulse: 80   82  Resp: 20   (!) 24  Temp:      TempSrc:      SpO2: 94% 97% 96% 96%  Weight:      Height:        Intake/Output Summary (Last 24 hours) at 06/21/2024 1929 Last data filed at 06/21/2024 1900 Gross per 24 hour  Intake 720 ml  Output 2500 ml  Net -1780 ml   Filed Weights   06/14/24 0938  Weight: 113.4 kg    Examination:   Constitutional: NAD, AAOx3 HEENT: conjunctivae and lids normal, EOMI CV: No cyanosis.   RESP: on BiPAP Extremities: bruising and edema around left knee, tender to palpation. SKIN: warm, dry Neuro: II - XII grossly intact.   Psych: Normal mood and affect.  Appropriate judgement and reason   Data Reviewed: I have personally reviewed labs and imaging studies  Time spent: 35 minutes  Marilyn Haber, MD [facility] If 7PM-7AM, please contact night-coverage 06/21/2024, 7:29 PM

## 2024-06-21 NOTE — Progress Notes (Signed)
 Occupational Therapy Treatment Patient Details Name: [name] MRN: [id number] DOB: Jul 08, 1956 Today's Date: 06/21/2024   History of present illness DEVANSHI CALIFF is a 68 y.o. female with medical history significant for morbid obesity with BMI of 45.73, history of atrial fibrillation status post cardioversion 05/25 on chronic anticoagulation therapy, history of DVT, chronic respiratory failure on 6 L via nasal cannula and BiPAP at night, history of hypothyroidism, rheumatoid arthritis, varicose veins involving her lower extremities, history of recurrent epistaxis who presents to the emergency room from the skilled nursing facility where she resides for evaluation of change in mental status and fall resulting in LLE laceration.   OT comments  Pt seen for OT tx with focus on incorporating ECS into ADL and exercises to minimize SOB. With BUE push/pull ex pt endorsing 4/10 SOB, VSS on 15L HFNC. Pt educated in cognitive behavioral pain coping strategies and grading activity to minimize L shoulder pain/discomfort. Pt continues to benefit from OT services.       If plan is discharge home, recommend the following:  Assist for transportation;Assistance with cooking/housework;Help with stairs or ramp for entrance;A lot of help with bathing/dressing/bathroom;Two people to help with walking and/or transfers   Equipment Recommendations  Other (comment) (defer)    Recommendations for Other Services      Precautions / Restrictions Precautions Precautions: Fall Recall of Precautions/Restrictions: Intact Restrictions Weight Bearing Restrictions Per Provider Order: No       Mobility Bed Mobility               General bed mobility comments: deferred due to respiratory status    Transfers                         Balance                                           ADL either performed or assessed with clinical judgement   ADL Overall ADL's : Needs  assistance/impaired     Grooming: Oral care Grooming Details (indicate cue type and reason): long sitting in bed, VC for activity pacing and PLB to minimize SOB                                    Extremity/Trunk Assessment              Vision       Perception     Praxis     Communication Communication Communication: No apparent difficulties   Cognition Arousal: Alert Behavior During Therapy: WFL for tasks assessed/performed, Anxious Cognition: No apparent impairments                               Following commands: Intact        Cueing   Cueing Techniques: Verbal cues, Tactile cues  Exercises Other Exercises Other Exercises: Pt instructed in push/pull x10 with emphasis on breathing technique Other Exercises: Pt instructed in ECS to promote improved tolerance and minimize SOB    Shoulder Instructions       General Comments      Pertinent Vitals/ Pain       Pain Assessment Pain Assessment: Faces Faces Pain Scale: Hurts a little bit Pain Location:  L LE, LUE with exercises Pain Descriptors / Indicators: Constant, Discomfort, Grimacing, Guarding Pain Intervention(s): Limited activity within patient's tolerance, Monitored during session, Repositioned  Home Living                                          Prior Functioning/Environment              Frequency  Min 2X/week        Progress Toward Goals  OT Goals(current goals can now be found in the care plan section)  Progress towards OT goals: Progressing toward goals  Acute Rehab OT Goals Patient Stated Goal: improve function and breathing OT Goal Formulation: With patient Time For Goal Achievement: 07/01/24 Potential to Achieve Goals: Fair  Plan      Co-evaluation    PT/OT/SLP Co-Evaluation/Treatment: Yes Reason for Co-Treatment: Complexity of the patient's impairments (multi-system involvement);For patient/therapist safety;To address  functional/ADL transfers PT goals addressed during session: Mobility/safety with mobility;Balance OT goals addressed during session: ADL's and self-care      AM-PAC OT 6 Clicks Daily Activity     Outcome Measure   Help from another person eating meals?: None Help from another person taking care of personal grooming?: A Little Help from another person toileting, which includes using toliet, bedpan, or urinal?: A Lot Help from another person bathing (including washing, rinsing, drying)?: A Lot Help from another person to put on and taking off regular upper body clothing?: A Little Help from another person to put on and taking off regular lower body clothing?: A Lot 6 Click Score: 16    End of Session Equipment Utilized During Treatment: Oxygen  OT Visit Diagnosis: Other abnormalities of gait and mobility (R26.89)   Activity Tolerance Patient tolerated treatment well;Treatment limited secondary to medical complications (Comment) (respiratory status)   Patient Left in bed;with call bell/phone within reach   Nurse Communication          Time: 8552-8488 OT Time Calculation (min): 24 min  Charges: OT General Charges $OT Visit: 1 Visit OT Treatments $Therapeutic Activity: 8-22 mins  Warren SAUNDERS., MPH, MS, OTR/L ascom [phone] 06/21/24, 4:59 PM

## 2024-06-21 NOTE — Plan of Care (Signed)

## 2024-06-22 ENCOUNTER — Encounter: Admitting: Family

## 2024-06-22 DIAGNOSIS — I5033 Acute on chronic diastolic (congestive) heart failure: Secondary | ICD-10-CM | POA: Diagnosis not present

## 2024-06-22 DIAGNOSIS — J9612 Chronic respiratory failure with hypercapnia: Secondary | ICD-10-CM | POA: Diagnosis not present

## 2024-06-22 DIAGNOSIS — J9621 Acute and chronic respiratory failure with hypoxia: Secondary | ICD-10-CM | POA: Diagnosis not present

## 2024-06-22 DIAGNOSIS — I480 Paroxysmal atrial fibrillation: Secondary | ICD-10-CM | POA: Diagnosis not present

## 2024-06-22 DIAGNOSIS — J9601 Acute respiratory failure with hypoxia: Secondary | ICD-10-CM | POA: Diagnosis not present

## 2024-06-22 LAB — BASIC METABOLIC PANEL WITH GFR
Anion gap: 11 (ref 5–15)
BUN: 29 mg/dL — ABNORMAL HIGH (ref 8–23)
CO2: 35 mmol/L — ABNORMAL HIGH (ref 22–32)
Calcium: 9.6 mg/dL (ref 8.9–10.3)
Chloride: 91 mmol/L — ABNORMAL LOW (ref 98–111)
Creatinine, Ser: 1.07 mg/dL — ABNORMAL HIGH (ref 0.44–1.00)
GFR, Estimated: 57 mL/min — ABNORMAL LOW (ref >60)
Glucose, Bld: 107 mg/dL — ABNORMAL HIGH (ref 70–99)
Potassium: 5 mmol/L (ref 3.5–5.1)
Sodium: 137 mmol/L (ref 135–145)

## 2024-06-22 LAB — GLUCOSE, CAPILLARY
Glucose-Capillary: 90 mg/dL (ref 70–99)
Glucose-Capillary: 101 mg/dL — ABNORMAL HIGH (ref 70–99)

## 2024-06-22 MED ORDER — FUROSEMIDE 40 MG PO TABS
80.0000 mg | ORAL_TABLET | Freq: Every day | ORAL | Status: DC
  Administered 2024-06-22 – 2024-06-24 (×3): 80 mg via ORAL
  Filled 2024-06-22 (×3): qty 2

## 2024-06-22 NOTE — Progress Notes (Signed)
 PROGRESS NOTE    LAYLAMARIE MEUSER  FMW:[id number] DOB: 1956-12-03 DOA: 06/14/2024 PCP: [name] DASEN, PA-C  246A/[id number]-AA  LOS: 8 days   Brief hospital course:   Assessment & Plan: LYRICK WORLAND is a 68 y.o. female with medical history significant for morbid obesity with BMI of 45.73, history of atrial fibrillation status post cardioversion 05/25 on chronic anticoagulation therapy, history of DVT, chronic respiratory failure on 6 L via nasal cannula and BiPAP at night, history of hypothyroidism, rheumatoid arthritis, varicose veins involving her lower extremities, history of recurrent epistaxis who presents to the emergency room from the skilled nursing facility where she resides for evaluation of change in mental status. According to the patient she does not remember the events that preceded her fall.   She was found by the nursing home staff on the floor and hypoxic with room air pulse oximetry in the 60s.    Chest x-ray reviewed by me shows persistent mixed interstitial and airspace opacification may be due to edema or pneumonia.   CT scan of the head without contrast showed no acute intracranial abnormality. No skull fracture. Mild chronic small vessel ischemia. Mucosal thickening with high density within the right maxillary sinus, however no evidence of facial bone fracture. This may be related to chronic sinusitis, including fungal infection. Near complete opacification of left mastoid air cells, partial opacification of right mastoid air cells.   Cervical spine CT shows moderate diffuse chronic degenerative disc disease and facet arthropathy without evidence of acute traumatic injury. Emphysema.   Left ankle x-ray showed no acute osseous abnormality. Degenerative changes in the ankle joint.   Left knee x-ray showed  no acute findings. Left knee osteoarthritis, worst in the medial compartment.   Acute on chronic hypoxic and hypercapnic respiratory failure (HCC) COPD on 6L O2  at baseline --found with decreased responsiveness on room air pulse oximetry in the 60s --VBG showed uncompensated respiratory acidosis. --currently on 10-12L with occasional desat, unable to wean down.  Started on tx for COPD exacerbation --pulm and cardio consulted --Continue supplemental O2 to keep sats >=90%, wean as tolerated --BiPAP nightly   Acute on chronic heart failure with preserved ejection fraction (HFpEF) (HCC) -- LVEF of 50 to 55% from a 2D echocardiogram which was done 06/25 --has been receiving IV lasix  40 BID, but then diuretics held due to Cr increase --cardio consulted --resume home oral lasix  80 mg daily today  COPD exacerbation --no cough, no sputum production, however, no able to wean down from 12L.  On chronic prednisone  5 mg for RA. --pulm consulted --cont prednisone  40 mg daily --cont Brovana  nebs, pulmicort  nebs, DuoNebs, Singulair  and Yupelri   Laceration of left leg  status post suture in the ER --curbsided Gen surgery-- recommends empiric antibiotic.  --dressing change with xerofoam --cont Bactrim  for 7 days -- suture to be removed in two weeks    ABLA (acute blood loss anemia) history of chronic anemia --Secondary to left lower extremity laceration following a fall in the setting of chronic anticoagulant use and varicose veins --s/p 3u pRBC --cont iron suppl --monitor Hgb and transfuse to keep Hgb >7   Atrial fibrillation (HCC) --cont amio and cardizem  --hold Xarelto  since still dropping Hgb requiring blood transfusion   Obesity, Class III, BMI 40-49.9 (morbid obesity) --Complicates overall prognosis and care   Rheumatoid arthritis (HCC) --Continue prednisone  and hydroxychloroquine    Hypothyroidism --Continue Synthroid   Hypokalemia Hypomag --monitor and supplement PRN    DVT prophylaxis: Lovenox  SQ Code Status:  Full code  Family Communication:  Level of care: Progressive Dispo:   The patient is from: SNF Anticipated d/c is to:  SNF Anticipated d/c date is: undetermined   Subjective and Interval History:  Pt reported breathing better today.     Objective: Vitals:   06/22/24 1038 06/22/24 1337 06/22/24 1407 06/22/24 1428  BP: (!) 104/57   126/67  Pulse: 72   77  Resp: 19 20 20  (!) 22  Temp: 98.6 F (37 C)   98.5 F (36.9 C)  TempSrc: Oral   Oral  SpO2: 96%  94% 94%  Weight:      Height:        Intake/Output Summary (Last 24 hours) at 06/22/2024 1729 Last data filed at 06/22/2024 1429 Gross per 24 hour  Intake 1080 ml  Output 1700 ml  Net -620 ml   Filed Weights   06/14/24 0938  Weight: 113.4 kg    Examination:   Constitutional: NAD, AAOx3 HEENT: conjunctivae and lids normal, EOMI CV: No cyanosis.   RESP: 10L, 95% Extremities: left knee wound improved SKIN: warm, dry Neuro: II - XII grossly intact.   Psych: Normal mood and affect.  Appropriate judgement and reason    Data Reviewed: I have personally reviewed labs and imaging studies  Time spent: 35 minutes  Ellouise Haber, MD [facility] If 7PM-7AM, please contact night-coverage 06/22/2024, 5:29 PM

## 2024-06-22 NOTE — Progress Notes (Signed)
  Progress Note  Patient Name: [name] Date of Encounter: 06/22/2024 [facility] Cardiologist: Joylene GLENWOOD Cage  Interval Summary   Feeling better with less shortness of breath.  No chest pain or palpitations.  No bleeding over last 24 hours; notes quite a bit of bleeding from left leg laceration after recent fall.  Has fallen several times in the past.  Vital Signs Vitals:   06/22/24 1038 06/22/24 1337 06/22/24 1407 06/22/24 1428  BP: (!) 104/57   126/67  Pulse: 72   77  Resp: 19 20 20  (!) 22  Temp: 98.6 F (37 C)   98.5 F (36.9 C)  TempSrc: Oral   Oral  SpO2: 96%  94% 94%  Weight:      Height:        Intake/Output Summary (Last 24 hours) at 06/22/2024 1721 Last data filed at 06/22/2024 1429 Gross per 24 hour  Intake 1080 ml  Output 1700 ml  Net -620 ml      06/14/2024    9:38 AM 06/13/2024    5:25 PM 05/30/2024    2:34 PM  Last 3 Weights  Weight (lbs) 250 lb 250 lb 240 lb 11.2 oz  Weight (kg) 113.399 kg 113.399 kg 109.181 kg      Telemetry/ECG  NSR - Personally Reviewed  Physical Exam  GEN: No acute distress.   Neck: Unable to assess JVP due to body habitus. Cardiac: Distant heart sounds.  RRR, no murmurs, rubs, or gallops.  Respiratory: Diminished breath sounds bilaterally GI: Soft, nontender, non-distended  MS: 1-2+ bilateral chronic appearing LE edema.  Assessment & Plan  PAF: Patient maintaining sinus rhythm.  Acute amiodarone  toxicity felt less likely cause of acute on chronic respiratory failure with hypoxia.  We discussed continuation of amio versus suspension and EP consultation for recurrent a-fib (given her morbid obesity and likely PH, she is high risk for recurrent a-fib).  We have agreed to discontinue amiodarone  at this time.  Anticoagulation is still on hold with severe anemia.  Some of this may well have been caused by recent fall/leg laceration, though I wonder if she also has some occult blood loss.  We will continue to hold rivaroxaban ;  low threshold for additional PRBC transfusion if hemoglobin drops below 7 again and/or symptoms develop.  Acute on chronic HFpEF: Very difficult volume exam due to morbid obesity; suspect patient is still volume up.  May ultimately require RHC if her breathing does not improve.  Renal function stable following recent decline with IV furosemide .  Continue current dose of oral furosemide .  Acute on chronic respiratory failure with hypoxia: Likely multifactorial  Continue diuresis as above; further management per IM/pulmonary.   For questions or updates, please contact [facility] Please consult [email or web address] for contact info under [facility].     Signed, Lonni Hanson, MD

## 2024-06-22 NOTE — Plan of Care (Signed)

## 2024-06-23 ENCOUNTER — Telehealth (HOSPITAL_COMMUNITY): Payer: Self-pay | Admitting: Pharmacy Technician

## 2024-06-23 ENCOUNTER — Other Ambulatory Visit (HOSPITAL_COMMUNITY): Payer: Self-pay

## 2024-06-23 DIAGNOSIS — I480 Paroxysmal atrial fibrillation: Secondary | ICD-10-CM | POA: Diagnosis not present

## 2024-06-23 DIAGNOSIS — J9612 Chronic respiratory failure with hypercapnia: Secondary | ICD-10-CM | POA: Diagnosis not present

## 2024-06-23 DIAGNOSIS — J9691 Respiratory failure, unspecified with hypoxia: Secondary | ICD-10-CM

## 2024-06-23 DIAGNOSIS — D649 Anemia, unspecified: Secondary | ICD-10-CM

## 2024-06-23 DIAGNOSIS — J9601 Acute respiratory failure with hypoxia: Secondary | ICD-10-CM | POA: Diagnosis not present

## 2024-06-23 DIAGNOSIS — R55 Syncope and collapse: Secondary | ICD-10-CM | POA: Diagnosis not present

## 2024-06-23 DIAGNOSIS — D638 Anemia in other chronic diseases classified elsewhere: Secondary | ICD-10-CM

## 2024-06-23 LAB — BASIC METABOLIC PANEL WITH GFR
Anion gap: 11 (ref 5–15)
BUN: 35 mg/dL — ABNORMAL HIGH (ref 8–23)
CO2: 39 mmol/L — ABNORMAL HIGH (ref 22–32)
Calcium: 9.8 mg/dL (ref 8.9–10.3)
Chloride: 86 mmol/L — ABNORMAL LOW (ref 98–111)
Creatinine, Ser: 1.04 mg/dL — ABNORMAL HIGH (ref 0.44–1.00)
GFR, Estimated: 59 mL/min — ABNORMAL LOW (ref >60)
Glucose, Bld: 85 mg/dL (ref 70–99)
Potassium: 4.3 mmol/L (ref 3.5–5.1)
Sodium: 136 mmol/L (ref 135–145)

## 2024-06-23 LAB — HEMOGLOBIN: Hemoglobin: 8.8 g/dL — ABNORMAL LOW (ref 12.0–15.0)

## 2024-06-23 MED ORDER — PREDNISONE 20 MG PO TABS
30.0000 mg | ORAL_TABLET | Freq: Every day | ORAL | Status: AC
  Administered 2024-06-24 – 2024-06-27 (×4): 30 mg via ORAL
  Filled 2024-06-23 (×4): qty 1

## 2024-06-23 NOTE — Progress Notes (Signed)
 Occupational Therapy Treatment Patient Details Name: [name] MRN: [id number] DOB: 1956-04-13 Today's Date: 06/23/2024   History of present illness [name] is a 68 y.o. female with medical history significant for morbid obesity with BMI of 45.73, history of atrial fibrillation status post cardioversion 05/25 on chronic anticoagulation therapy, history of DVT, chronic respiratory failure on 6 L via nasal cannula and BiPAP at night, history of hypothyroidism, rheumatoid arthritis, varicose veins involving her lower extremities, history of recurrent epistaxis who presents to the emergency room from the skilled nursing facility where she resides for evaluation of change in mental status and fall resulting in LLE laceration.   OT comments  Pt motivated to participate in session. PT/OT co-tx. Pt found soaked in urine, NT also in room to address. Pt on 10L HFNC, maintains O2 level of 90%+ throughout. Pt able to roll with CGA-MIN A for pericare/bathing and dons gown MOD A bed level, CGA +2 for bed mobility to transition to sit EOB. Cues for PLB throughout. 2x STS attempts using RW, bilateral feet blocked and pt benefits from increased recovery time during seated rest break between attempts, and is able to take few hesitant steps with MIN A. Discharge recommendation remains appropriate, OT will continue to follow. Patient will benefit from continued inpatient follow up therapy, <3 hours/day       If plan is discharge home, recommend the following:  Assist for transportation;Assistance with cooking/housework;Help with stairs or ramp for entrance;A lot of help with bathing/dressing/bathroom;Two people to help with walking and/or transfers   Equipment Recommendations  Other (comment)       Precautions / Restrictions Precautions Precautions: Fall Recall of Precautions/Restrictions: Intact Restrictions Weight Bearing Restrictions Per Provider Order: No       Mobility Bed Mobility Overal  bed mobility: Needs Assistance Bed Mobility: Supine to Sit, Sit to Supine Rolling: Min assist, Contact guard assist   Supine to sit: Contact guard, +2 for safety/equipment, HOB elevated, Used rails Sit to supine: Contact guard assist, +2 for physical assistance   General bed mobility comments: rolling L and R for linen change, able to roll R and come up to sitting, CGAx2    Transfers Overall transfer level: Needs assistance Equipment used: Rolling walker (2 wheels) Transfers: Sit to/from Stand Sit to Stand: Contact guard assist, +2 physical assistance Stand pivot transfers: Contact guard assist, +2 safety/equipment         General transfer comment: 1-2 steps to the L at EOB, painful for pt, CGAx2-minA for steadying     Balance Overall balance assessment: Needs assistance Sitting-balance support: Bilateral upper extremity supported, Feet supported Sitting balance-Leahy Scale: Fair     Standing balance support: Bilateral upper extremity supported, Reliant on assistive device for balance, During functional activity Standing balance-Leahy Scale: Fair Standing balance comment: limited standing tolerance, pt able to perform 2x bouts in standing and takes a few sidesteps towards [facility]                           ADL either performed or assessed with clinical judgement   ADL Overall ADL's : Needs assistance/impaired                     Lower Body Dressing: Total assistance;Bed level Lower Body Dressing Details (indicate cue type and reason): to don bil socks     Toileting- Clothing Manipulation and Hygiene: Bed level;+2 for physical assistance Toileting - Clothing Manipulation Details (indicate  cue type and reason): bed level pericare performed due to pure wick failure, CGA - minA to roll     Functional mobility during ADLs: Minimal assistance;+2 for safety/equipment;Contact guard assist;Rolling walker (2 wheels) General ADL Comments: pt puts forth good efforts,  does benefit from cues for PLB    Extremity/Trunk Assessment              Vision       Perception     Praxis     Communication Communication Communication: No apparent difficulties   Cognition Arousal: Alert Behavior During Therapy: WFL for tasks assessed/performed, Anxious Cognition: No apparent impairments                               Following commands: Intact        Cueing   Cueing Techniques: Verbal cues, Tactile cues, Visual cues        General Comments On 10L HFNC, SpO2 monitored and maintained >90% throughout. Pt returned to bed and noted to have small skin tear on L forearm, wrapped with gauze for protection and alerted RN.    Pertinent Vitals/ Pain       Pain Assessment Pain Assessment: Faces Faces Pain Scale: Hurts even more Pain Location: LLE with weight bearing Pain Descriptors / Indicators: Discomfort, Grimacing, Guarding, Moaning Pain Intervention(s): Limited activity within patient's tolerance, Monitored during session, Repositioned   Frequency  Min 2X/week        Progress Toward Goals  OT Goals(current goals can now be found in the care plan section)  Progress towards OT goals: Progressing toward goals  Acute Rehab OT Goals OT Goal Formulation: With patient Time For Goal Achievement: 07/01/24 Potential to Achieve Goals: Fair  Plan      Co-evaluation      Reason for Co-Treatment: Complexity of the patient's impairments (multi-system involvement);For patient/therapist safety;To address functional/ADL transfers PT goals addressed during session: Mobility/safety with mobility;Balance OT goals addressed during session: ADL's and self-care      AM-PAC OT 6 Clicks Daily Activity     Outcome Measure   Help from another person eating meals?: None Help from another person taking care of personal grooming?: A Little Help from another person toileting, which includes using toliet, bedpan, or urinal?: A Lot Help from  another person bathing (including washing, rinsing, drying)?: A Lot Help from another person to put on and taking off regular upper body clothing?: A Little Help from another person to put on and taking off regular lower body clothing?: A Lot 6 Click Score: 16    End of Session Equipment Utilized During Treatment: Oxygen  OT Visit Diagnosis: Other abnormalities of gait and mobility (R26.89)   Activity Tolerance Patient tolerated treatment well   Patient Left in bed;with call bell/phone within reach;with bed alarm set   Nurse Communication Mobility status (mobility, skin tear on L arm wrapped in gauze)        Time: 8896-8863 OT Time Calculation (min): 33 min  Charges: OT General Charges $OT Visit: 1 Visit OT Treatments $Self Care/Home Management : 8-22 mins  Leeandra Ellerson L. Ashar Lewinski, OTR/L  06/23/24, 2:42 PM

## 2024-06-23 NOTE — Progress Notes (Signed)
 Physical Therapy Treatment Patient Details Name: [name] MRN: [id number] DOB: 1956/02/01 Today's Date: 06/23/2024   History of Present Illness [name] is a 68 y.o. female with medical history significant for morbid obesity with BMI of 45.73, history of atrial fibrillation status post cardioversion 05/25 on chronic anticoagulation therapy, history of DVT, chronic respiratory failure on 6 L via nasal cannula and BiPAP at night, history of hypothyroidism, rheumatoid arthritis, varicose veins involving her lower extremities, history of recurrent epistaxis who presents to the emergency room from the skilled nursing facility where she resides for evaluation of change in mental status and fall resulting in LLE laceration.    PT Comments  Patient alert, noted to be soaked in urine and nurse tech entering room to address. PT/OT overlap for mobility, pt able to roll several times during session with CGA-minA for positioning, reliant on bed rails. Supine <> sit CGA x2, and fair sitting balance noted. She was able to stand twice with RW and CGAx2, limited by LLE pain. 1-2 sidesteps at EOB as well, minA for steadying. Pt returned to bed with needs in reach. The patient would benefit from further skilled PT intervention to continue to progress towards goals.   Pt on 10L throughout session and maintained saturations levels >90% throughout.     If plan is discharge home, recommend the following: Assistance with cooking/housework;Assist for transportation;Help with stairs or ramp for entrance;Two people to help with walking and/or transfers;Two people to help with bathing/dressing/bathroom   Can travel by private vehicle     No  Equipment Recommendations  None recommended by PT    Recommendations for Other Services       Precautions / Restrictions Precautions Precautions: Fall Recall of Precautions/Restrictions: Intact Restrictions Weight Bearing Restrictions Per Provider Order: No      Mobility  Bed Mobility Overal bed mobility: Needs Assistance Bed Mobility: Supine to Sit, Sit to Supine     Supine to sit: Contact guard, +2 for safety/equipment, HOB elevated, Used rails     General bed mobility comments: rolling L and R for linen change, able to roll R and come up to sitting, CGAx2    Transfers Overall transfer level: Needs assistance Equipment used: Rolling walker (2 wheels) Transfers: Sit to/from Stand Sit to Stand: Contact guard assist, +2 physical assistance           General transfer comment: 1-2 steps to the L at EOB, painful for pt, CGAx2-minA for steadying    Ambulation/Gait                   Stairs             Wheelchair Mobility     Tilt Bed    Modified Rankin (Stroke Patients Only)       Balance Overall balance assessment: Needs assistance Sitting-balance support: Bilateral upper extremity supported, Feet supported Sitting balance-Leahy Scale: Fair     Standing balance support: Bilateral upper extremity supported, Reliant on assistive device for balance, During functional activity Standing balance-Leahy Scale: Fair                              Hotel manager: No apparent difficulties  Cognition Arousal: Alert Behavior During Therapy: WFL for tasks assessed/performed, Anxious   PT - Cognitive impairments: No apparent impairments  Following commands: Intact      Cueing Cueing Techniques: Verbal cues, Tactile cues, Visual cues  Exercises      General Comments        Pertinent Vitals/Pain Pain Assessment Pain Assessment: Faces Faces Pain Scale: Hurts even more Pain Location: LLE with weight bearing Pain Descriptors / Indicators: Discomfort, Grimacing, Guarding, Moaning Pain Intervention(s): Limited activity within patient's tolerance, Monitored during session, Repositioned    Home Living                           Prior Function            PT Goals (current goals can now be found in the care plan section) Progress towards PT goals: Progressing toward goals    Frequency    Min 2X/week      PT Plan      Co-evaluation PT/OT/SLP Co-Evaluation/Treatment: Yes Reason for Co-Treatment: Complexity of the patient's impairments (multi-system involvement);For patient/therapist safety;To address functional/ADL transfers PT goals addressed during session: Mobility/safety with mobility;Balance OT goals addressed during session: ADL's and self-care      AM-PAC PT 6 Clicks Mobility   Outcome Measure  Help needed turning from your back to your side while in a flat bed without using bedrails?: A Little Help needed moving from lying on your back to sitting on the side of a flat bed without using bedrails?: A Lot Help needed moving to and from a bed to a chair (including a wheelchair)?: A Lot Help needed standing up from a chair using your arms (e.g., wheelchair or bedside chair)?: A Lot Help needed to walk in hospital room?: Total Help needed climbing 3-5 steps with a railing? : Total 6 Click Score: 11    End of Session Equipment Utilized During Treatment: Oxygen Activity Tolerance: Patient limited by pain Patient left: in bed;with call bell/phone within reach;with bed alarm set Nurse Communication: Mobility status PT Visit Diagnosis: Repeated falls (R29.6);Muscle weakness (generalized) (M62.81);History of falling (Z91.81);Unsteadiness on feet (R26.81);Difficulty in walking, not elsewhere classified (R26.2);Other abnormalities of gait and mobility (R26.89)     Time: 8896-8863 PT Time Calculation (min) (ACUTE ONLY): 33 min  Charges:    $Therapeutic Activity: 8-22 mins PT General Charges $$ ACUTE PT VISIT: 1 Visit                     Doyal Shams PT, DPT 1:15 PM,06/23/24

## 2024-06-23 NOTE — Plan of Care (Signed)
   Problem: Coping: Goal: Ability to adjust to condition or change in health will improve Outcome: Progressing   Problem: Fluid Volume: Goal: Ability to maintain a balanced intake and output will improve Outcome: Progressing   Problem: Health Behavior/Discharge Planning: Goal: Ability to identify and utilize available resources and services will improve Outcome: Progressing Goal: Ability to manage health-related needs will improve Outcome: Progressing   Problem: Nutritional: Goal: Maintenance of adequate nutrition will improve Outcome: Progressing Goal: Progress toward achieving an optimal weight will improve Outcome: Progressing   Problem: Skin Integrity: Goal: Risk for impaired skin integrity will decrease Outcome: Progressing   Problem: Tissue Perfusion: Goal: Adequacy of tissue perfusion will improve Outcome: Progressing

## 2024-06-23 NOTE — Progress Notes (Signed)
 PROGRESS NOTE    RENNA KILMER  FMW:[id number] DOB: 08-20-1956 DOA: 06/14/2024 PCP: [name] DASEN, PA-C  [id number]  LOS: 9 days   Brief hospital course:   Assessment & Plan: [name] is a 68 y.o. female with medical history significant for morbid obesity with BMI of 45.73, history of atrial fibrillation status post cardioversion 05/25 on chronic anticoagulation therapy, history of DVT, chronic respiratory failure on 6 L via nasal cannula and BiPAP at night, history of hypothyroidism, rheumatoid arthritis, varicose veins involving her lower extremities, history of recurrent epistaxis who presented to the emergency room from the skilled nursing facility where she resides for evaluation of change in mental status.   She was found by the nursing home staff on the floor and hypoxic with room air pulse oximetry in the 60s.    Acute on chronic hypoxic and hypercapnic respiratory failure (HCC) COPD on 6L O2 at baseline --found with decreased responsiveness on room air pulse oximetry in the 60s --VBG showed uncompensated respiratory acidosis. --currently on 10L HFNC. --pulm and cardio consulted --Continue supplemental O2 to keep sats >=90%, wean as tolerated --BiPAP nightly   Acute on chronic heart failure with preserved ejection fraction (HFpEF) (HCC) -- LVEF of 50 to 55% from a 2D echocardiogram which was done 06/25 --has been receiving IV lasix  40 BID, but then diuretics held due to Cr increase --cardio consulted --home oral lasix  80 mg daily resumed on 7/8. --cont home oral lasix  80 mg daily  COPD exacerbation --no cough, no sputum production, however, no able to wean down from 12L.  On chronic prednisone  5 mg for RA. --pulm consulted --received prednisone  40 mg daily x5, start to taper by 10 mg tomorrow. --cont Brovana  nebs, pulmicort  nebs, DuoNebs, Singulair  and Yupelri   Laceration of left leg  status post suture in the ER --Left knee x-ray showed no acute  findings. Left knee osteoarthritis, worst in the medial compartment. --curbsided Gen surgery-- recommends empiric antibiotic, completed 7 days of Bactrim .  Still has extensive bruising and edema around left knee, but overall slowly looking better (see photos). --dressing change daily with xerofoam; pre-medicate with IV morphine  15 min prior to dressing change. -- sutures to be removed in two weeks    ABLA (acute blood loss anemia) history of chronic anemia --Secondary to left lower extremity laceration following a fall in the setting of chronic anticoagulant use and varicose veins --s/p 3u pRBC --cont iron suppl --monitor Hgb and transfuse to keep Hgb >7   Paroxysmal Atrial fibrillation (HCC) Maintaining normal sinus rhythm Amiodarone  held by cardiology in the setting of lung disease Prior cardioversion May 2025 at [facility] --cont cardizem  240 mg daily --hold Xarelto  (decision made with cardio)   Obesity, Class III, BMI 40-49.9 (morbid obesity) --Complicates overall prognosis and care   Rheumatoid arthritis (HCC) --Continue prednisone  and hydroxychloroquine    Hypothyroidism --Continue Synthroid   Hypokalemia Hypomag --monitor and supplement PRN    DVT prophylaxis: Lovenox  SQ Code Status: Full code  Family Communication:  Level of care: Progressive Dispo:   The patient is from: SNF Anticipated d/c is to: SNF Anticipated d/c date is: 2-3 days, when pt's O2 requirement is close to home 6L.   Subjective and Interval History:  Pt respiratory status improved.  Had >4L urine output for the past day with oral lasix  80 mg.  Continued to be very tender around her left knee.   Objective: Vitals:   06/23/24 0746 06/23/24 1203 06/23/24 1333 06/23/24 1600  BP: 113/61 ROLLEN)  102/56  109/66  Pulse: 60 70  78  Resp: 19 14  18   Temp: 98.8 F (37.1 C) 98.6 F (37 C)  98.6 F (37 C)  TempSrc:    Oral  SpO2: 100% 96% 97% 98%  Weight:      Height:        Intake/Output Summary (Last  24 hours) at 06/23/2024 1621 Last data filed at 06/23/2024 1500 Gross per 24 hour  Intake 600 ml  Output 3200 ml  Net -2600 ml   Filed Weights   06/14/24 0938  Weight: 113.4 kg    Examination:   Constitutional: NAD, AAOx3 HEENT: conjunctivae and lids normal, EOMI CV: No cyanosis.   RESP: normal respiratory effort, on 10L, sating 97% Extremities: extensive bruising and edema around left knee. SKIN: warm, dry Neuro: II - XII grossly intact.   Psych: Normal mood and affect.  Appropriate judgement and reason  Photo taken on 7/8   Data Reviewed: I have personally reviewed labs and imaging studies  Time spent: 35 minutes  Ellouise Haber, MD [facility] If 7PM-7AM, please contact night-coverage 06/23/2024, 4:21 PM

## 2024-06-23 NOTE — Plan of Care (Signed)
  Problem: Coping: Goal: Ability to adjust to condition or change in health will improve Outcome: Progressing   Problem: Fluid Volume: Goal: Ability to maintain a balanced intake and output will improve Outcome: Progressing   Problem: Tissue Perfusion: Goal: Adequacy of tissue perfusion will improve Outcome: Progressing   Problem: Safety: Goal: Ability to remain free from injury will improve Outcome: Progressing

## 2024-06-23 NOTE — Progress Notes (Signed)
 Progress Note  Patient Name: [name] Date of Encounter: 06/23/2024 [facility] Cardiologist: None   Interval Summary    Reports persistent left leg pain.  Xarelto  was held for anemia. Hgb 7.5>8.8 after blood transfusion.  Vital Signs Vitals:   06/22/24 2040 06/23/24 0011 06/23/24 0421 06/23/24 0746  BP: 101/64 117/63 115/67 113/61  Pulse: 73 61 (!) 59 60  Resp: 16 16 16 19   Temp: 98.1 F (36.7 C) (!) 96.8 F (36 C) (!) 97.1 F (36.2 C) 98.8 F (37.1 C)  TempSrc:  Axillary Axillary   SpO2: 96% 97% 100% 100%  Weight:      Height:        Intake/Output Summary (Last 24 hours) at 06/23/2024 0919 Last data filed at 06/23/2024 0014 Gross per 24 hour  Intake 240 ml  Output 4000 ml  Net -3760 ml      06/14/2024    9:38 AM 06/13/2024    5:25 PM 05/30/2024    2:34 PM  Last 3 Weights  Weight (lbs) 250 lb 250 lb 240 lb 11.2 oz  Weight (kg) 113.399 kg 113.399 kg 109.181 kg      Telemetry/ECG  NSR HR 70s - Personally Reviewed  Physical Exam  GEN: No acute distress.   Neck: No JVD Cardiac: RRR, no murmurs, rubs, or gallops.  Respiratory: Clear to auscultation bilaterally. GI: Soft, nontender, non-distended  MS: No edema    Relevant CV Studies:   Echo 05/31/24  1. Left ventricular ejection fraction, by estimation, is 55 to 60%. The  left ventricle has normal function. The left ventricle has no regional  wall motion abnormalities. There is mild concentric left ventricular  hypertrophy. Left ventricular diastolic  parameters are consistent with Grade II diastolic dysfunction  (pseudonormalization).   2. Right ventricular systolic function is normal. The right ventricular  size is normal.   3. The mitral valve is grossly normal. Moderate mitral valve  regurgitation.   4. The aortic valve is calcified. Aortic valve regurgitation is not  visualized. Aortic valve sclerosis/calcification is present, without any  evidence of aortic stenosis.    Echo  02/2018 Study Conclusions   - Left ventricle: Moderate mid and basal septal hypertrophy. The    cavity size was normal. Wall thickness was increased in a pattern    of moderate LVH. Systolic function was normal. The estimated    ejection fraction was in the range of 60% to 65%. Left    ventricular diastolic function parameters were normal.  - Mitral valve: Severely calcified annulus. Moderately thickened,    moderately calcified leaflets . There was mild regurgitation.  - Atrial septum: There was increased thickness of the septum,    consistent with lipomatous hypertrophy. No defect or patent    foramen ovale was identified.  - Impressions: Normal GLS -17.3.   Patient Profile: RONEE RANGANATHAN is a 68 y.o. female with a hx of hypoxia secondary to chronic ILD/COPD on home O2 at 6L, morbid obesity, OSA, pulmonary HTN, IDA, RA, h/o PE, paroxysmal Afib, HFpEF, varicose veins s/p laser ablation, h/o Hashimoto's thyroiditis with hypothyroidism who is being seen 06/21/2024 for the evaluation of cardiac medications (alternative to amiodarone )  Assessment & Plan   Acute on chronic hypoxic and hypercapnic respiratory failure COPD on 6L O2 - found with decreased responsiveness with O2 in the 60s - now on 10L O2 - continue steroids and duonebs - pulmonology consulted and requested alternative to amiodarone  given severe lung disease. Given recent  start of amio low suspicion amio toxicity, but agree she is not the best candidate for amiodarone .    Acute on chronic diastolic heart failure - IV lasix  80mg  daily changed to PTA lasix  80mg  daily - UOP net -13.4L - she appears euvolemic - recent echo showed normal LVEF 55-60%, no WMA, G2DD, mild LVH, mod MR   ABLA Chronic anemia - s/p 3u PRBC - Hgb 7.5>8.8 - Xarelto  held - suppl iron - she denies any BRBPR or dark stools - may be from bleeding from the laceration in left leg   Paroxysmal Afib - s/p DCCV 04/2024 at [facility] placed on diltiazem  and  amiodarone  200mg  daily - PTA diltiazem  240mg  daily - PTA Xarelto  20mg  daily>held for anemia - remains in NSR - will likely require AA to maintain SR given severe lung issues, may need EP to weigh in. - can consider tikosyn. No h/o structural heart disease. Would continue amio for now     For questions or updates, please contact [facility] Please consult [email or web address] for contact info under       Signed, Keeshia Sanderlin VEAR Fishman, PA-C

## 2024-06-23 NOTE — Telephone Encounter (Signed)
 Patient Product/process development scientist completed.    The patient is insured through [employer]. Patient has Medicare and is not eligible for a copay card, but may be able to apply for patient assistance or Medicare RX Payment Plan (Patient Must reach out to their plan, if eligible for payment plan), if available.    Ran test claim for Eliquis 5 mg and the current 30 day co-pay is $0.00.   This test claim was processed through [facility]- copay amounts may vary at other pharmacies due to pharmacy/plan contracts, or as the patient moves through the different stages of their insurance plan.     Roland Earl, CPHT Pharmacy Technician III Certified Patient Advocate [facility] Patient Advocate Team Direct Number: [phone]  Fax: [phone]

## 2024-06-24 DIAGNOSIS — S81812A Laceration without foreign body, left lower leg, initial encounter: Secondary | ICD-10-CM | POA: Diagnosis not present

## 2024-06-24 DIAGNOSIS — I4819 Other persistent atrial fibrillation: Secondary | ICD-10-CM

## 2024-06-24 DIAGNOSIS — R55 Syncope and collapse: Secondary | ICD-10-CM

## 2024-06-24 DIAGNOSIS — I480 Paroxysmal atrial fibrillation: Secondary | ICD-10-CM | POA: Diagnosis not present

## 2024-06-24 DIAGNOSIS — J9691 Respiratory failure, unspecified with hypoxia: Secondary | ICD-10-CM | POA: Diagnosis not present

## 2024-06-24 DIAGNOSIS — D62 Acute posthemorrhagic anemia: Secondary | ICD-10-CM

## 2024-06-24 DIAGNOSIS — J9622 Acute and chronic respiratory failure with hypercapnia: Secondary | ICD-10-CM | POA: Diagnosis not present

## 2024-06-24 DIAGNOSIS — D649 Anemia, unspecified: Secondary | ICD-10-CM | POA: Diagnosis not present

## 2024-06-24 LAB — BASIC METABOLIC PANEL WITH GFR
Anion gap: 12 (ref 5–15)
BUN: 46 mg/dL — ABNORMAL HIGH (ref 8–23)
CO2: 39 mmol/L — ABNORMAL HIGH (ref 22–32)
Calcium: 9.5 mg/dL (ref 8.9–10.3)
Chloride: 83 mmol/L — ABNORMAL LOW (ref 98–111)
Creatinine, Ser: 1.25 mg/dL — ABNORMAL HIGH (ref 0.44–1.00)
GFR, Estimated: 48 mL/min — ABNORMAL LOW (ref >60)
Glucose, Bld: 81 mg/dL (ref 70–99)
Potassium: 4.3 mmol/L (ref 3.5–5.1)
Sodium: 134 mmol/L — ABNORMAL LOW (ref 135–145)

## 2024-06-24 LAB — CBC
HCT: 30.8 % — ABNORMAL LOW (ref 36.0–46.0)
Hemoglobin: 9.4 g/dL — ABNORMAL LOW (ref 12.0–15.0)
MCH: 28.4 pg (ref 26.0–34.0)
MCHC: 30.5 g/dL (ref 30.0–36.0)
MCV: 93.1 fL (ref 80.0–100.0)
Platelets: 261 K/uL (ref 150–400)
RBC: 3.31 MIL/uL — ABNORMAL LOW (ref 3.87–5.11)
RDW: 16.6 % — ABNORMAL HIGH (ref 11.5–15.5)
WBC: 8.6 K/uL (ref 4.0–10.5)
nRBC: 0 % (ref 0.0–0.2)

## 2024-06-24 MED ORDER — ALBUTEROL SULFATE (2.5 MG/3ML) 0.083% IN NEBU
2.5000 mg | INHALATION_SOLUTION | Freq: Every day | RESPIRATORY_TRACT | Status: DC
  Administered 2024-06-24 – 2024-06-28 (×5): 2.5 mg via RESPIRATORY_TRACT
  Filled 2024-06-24 (×5): qty 3

## 2024-06-24 MED ORDER — FUROSEMIDE 40 MG PO TABS
40.0000 mg | ORAL_TABLET | Freq: Every day | ORAL | Status: DC
  Administered 2024-06-25 – 2024-07-01 (×7): 40 mg via ORAL
  Filled 2024-06-24 (×7): qty 1

## 2024-06-24 MED ORDER — ALBUTEROL SULFATE (2.5 MG/3ML) 0.083% IN NEBU: 2.5000 mg | INHALATION_SOLUTION | RESPIRATORY_TRACT | Status: DC | PRN

## 2024-06-24 MED ORDER — RIVAROXABAN 20 MG PO TABS
20.0000 mg | ORAL_TABLET | Freq: Every day | ORAL | Status: DC
  Administered 2024-06-24 – 2024-07-01 (×8): 20 mg via ORAL
  Filled 2024-06-24 (×8): qty 1

## 2024-06-24 NOTE — Plan of Care (Signed)

## 2024-06-24 NOTE — Progress Notes (Signed)
  Progress Note  Patient Name: MONAYE BLACKIE Date of Encounter: 06/24/2024 [facility] Cardiologist: None   Interval Summary    UOP -3.6L. Scr/BUN mildly up. Still on 10 HFNC. No chest pain.   Vital Signs Vitals:   06/24/24 0250 06/24/24 0724 06/24/24 0759 06/24/24 1101  BP: 110/65 114/83  96/61  Pulse: 63 (!) 59  64  Resp: 18 18  18   Temp: 98.8 F (37.1 C) 98.8 F (37.1 C)  98.8 F (37.1 C)  TempSrc:      SpO2: 100% 100% 100% 98%  Weight:      Height:        Intake/Output Summary (Last 24 hours) at 06/24/2024 1140 Last data filed at 06/24/2024 1025 Gross per 24 hour  Intake 720 ml  Output 2400 ml  Net -1680 ml      06/23/2024    4:00 PM 06/14/2024    9:38 AM 06/13/2024    5:25 PM  Last 3 Weights  Weight (lbs) 217 lb 2.5 oz 250 lb 250 lb  Weight (kg) 98.5 kg 113.399 kg 113.399 kg      Telemetry/ECG  No tele - Personally Reviewed  Physical Exam  GEN: No acute distress.   Neck: No JVD Cardiac: RRR, no murmurs, rubs, or gallops.  Respiratory: Clear to auscultation bilaterally. GI: Soft, nontender, non-distended  MS: No edema  Assessment & Plan   Acute on chronic hypoxic and hypercapnic respiratory failure COPD on 6L O2 - found with decreased responsiveness with O2 in the 60s - now on 10L HFNC - continue steroids and duonebs - pulmonology consulted and requested alternative to amiodarone  given severe lung disease. Given recent start of amio low suspicion amio toxicity, but agree she is not the best candidate for amiodarone -this was stopped 7/8   Acute on chronic diastolic heart failure - IV lasix  80mg  daily changed to PTA lasix  80mg  daily - UOP net -13.4L - she appears euvolemic. Scr/Bun mildly up - recent echo showed normal LVEF 55-60%, no WMA, G2DD, mild LVH, mod MR - continue current lasix  dose   ABLA Chronic anemia - s/p 3u PRBC - Hgb 7.5>8.8 - Xarelto  held - suppl iron - she denies any BRBPR or dark stools -  bleeding from the  laceration in left leg likely contributing - re-check CBC   Paroxysmal Afib - s/p DCCV 04/2024 at [facility] placed on diltiazem  and amiodarone  200mg  daily - PTA diltiazem  240mg  daily - PTA Xarelto  20mg  daily>held for anemia - remains in NSR - amiodarone  stopped for lung disease. will likely require AA to maintain SR can see EP as OP. - can consider tikosyn. No h/o structural heart disease.  - If Hgb is stable can restart Xarelto      For questions or updates, please contact [facility] Please consult [email or web address] for contact info under       Signed, Christelle Igoe VEAR Fishman, PA-C

## 2024-06-24 NOTE — Progress Notes (Addendum)
 PROGRESS NOTE    [name]  FMW:[id number] DOB: 02-22-1956 DOA: 06/14/2024 PCP: Marilyn Tari DASEN, PA-C  246A/246A-AA  LOS: 10 days   Brief hospital course: [name] is a 67 y.o. female with medical history significant for morbid obesity with BMI of 45.73, history of atrial fibrillation status post cardioversion 05/25 on chronic anticoagulation therapy, history of DVT, chronic respiratory failure on 6 L via nasal cannula and BiPAP at night, history of hypothyroidism, rheumatoid arthritis, varicose veins involving her lower extremities, history of recurrent epistaxis who presented to the emergency room from the skilled nursing facility where she resides for evaluation of change in mental status.   She was found by the nursing home staff on the floor and hypoxic with room air pulse oximetry in the 60s.  7/10: Hemodynamically stable but remained on 10 L of oxygen.  Facility wants her to be at baseline before coming back.  Cardiology recommending restarting Xarelto  with a close monitoring.  Assessment & Plan:    Acute on chronic hypoxic and hypercapnic respiratory failure (HCC) COPD on 6L O2 at baseline --found with decreased responsiveness on room air pulse oximetry in the 60s --VBG showed uncompensated respiratory acidosis. --currently on 10L HFNC. --pulm and cardio consulted --Continue supplemental O2 to keep sats >=90%, wean as tolerated --BiPAP nightly -- Apparently she was not getting proper BiPAP at her facility.   Acute on chronic heart failure with preserved ejection fraction (HFpEF) (HCC) -- LVEF of 50 to 55% from a 2D echocardiogram which was done 06/25 --has been receiving IV lasix  40 BID, but then diuretics held due to Cr increase --cardio consulted --home oral lasix  80 mg daily resumed on 7/8. -- Decreasing the dose of Lasix  to 40 mg daily due to continuous slowly uptrending creatinine  COPD exacerbation --no cough, no sputum production, however, no able to  wean down from 10L.  On chronic prednisone  5 mg for RA. --pulm consulted --received prednisone  40 mg daily x5, start to taper by 10 mg  --cont Brovana  nebs, pulmicort  nebs, DuoNebs, Singulair  and Yupelri   Laceration of left leg  status post suture in the ER --Left knee x-ray showed no acute findings. Left knee osteoarthritis, worst in the medial compartment. --curbsided Gen surgery-- recommends empiric antibiotic, completed 7 days of Bactrim .  Still has extensive bruising and edema around left knee, but overall slowly looking better (see photos). --dressing change daily with xerofoam; pre-medicate with IV morphine  15 min prior to dressing change. -- sutures to be removed in two weeks    ABLA (acute blood loss anemia) history of chronic anemia --Secondary to left lower extremity laceration following a fall in the setting of chronic anticoagulant use and varicose veins. Hemoglobin now improved to 9.4 --s/p 3u pRBC --cont iron suppl --monitor Hgb and transfuse to keep Hgb >7   Paroxysmal Atrial fibrillation (HCC) Maintaining normal sinus rhythm Amiodarone  held by cardiology in the setting of lung disease Prior cardioversion May [facility] at [facility] --cont cardizem  240 mg daily -- Xarelto  was initially held due to increased risk of fall, cardiology recommending restarting with a close monitoring.   Obesity, Class III, BMI 40-49.9 (morbid obesity) --Complicates overall prognosis and care   Rheumatoid arthritis (HCC) --Continue prednisone  and hydroxychloroquine    Hypothyroidism --Continue Synthroid   Hypokalemia Hypomag --monitor and supplement PRN    DVT prophylaxis: Lovenox  SQ Code Status: Full code  Family Communication:  Level of care: Progressive Dispo:   The patient is from: SNF Anticipated d/c is to: SNF Anticipated  d/c date is: 2-3 days, when pt's O2 requirement is close to home 6L.   Subjective and Interval History:  Patient was seen and examined today.  No new  concern.  But remained on 10 L of oxygen.  Objective: Vitals:   06/24/24 1101 06/24/24 1349 06/24/24 1350 06/24/24 1351  BP: 96/61     Pulse: 64     Resp: 18     Temp: 98.8 F (37.1 C)     TempSrc:      SpO2: 98% 98% 97% 98%  Weight:      Height:        Intake/Output Summary (Last 24 hours) at 06/24/2024 1513 Last data filed at 06/24/2024 1435 Gross per 24 hour  Intake 600 ml  Output 1200 ml  Net -600 ml   Filed Weights   06/14/24 0938 06/23/24 1600  Weight: 113.4 kg 98.5 kg    Examination:   General.  Obese lady, in no acute distress. Pulmonary.  Lungs clear bilaterally, normal respiratory effort. CV.  Regular rate and rhythm, no JVD, rub or murmur. Abdomen.  Soft, nontender, nondistended, BS positive. CNS.  Alert and oriented .  No focal neurologic deficit. Extremities.  No edema, pulses intact and symmetrical. Bandage of left calf. Psychiatry.  Judgment and insight appears normal.   Photo taken on 7/8   Data Reviewed: I have personally reviewed labs and imaging studies  Time spent: 50 minutes  This record has been created using Dragon voice recognition software. Errors have been sought and corrected,but may not always be located. Such creation errors do not reflect on the standard of care.   Marilyn Dare, MD [facility] If 7PM-7AM, please contact night-coverage 06/24/2024, 3:13 PM

## 2024-06-24 NOTE — TOC Progression Note (Signed)
 Transition of Care [facility]) - Progression Note    Patient Details  Name: [name] MRN: [id number] Date of Birth: 06-21-1956  [facility]) CM/SW Contact  Tomasa JAYSON Childes, RN Phone Number: 06/24/2024, 1:10 PM  Clinical Narrative:    Patient currently requiring 10 liters of oxygen.  Tammy in admissios stated they usually can not facilitate over 5 liters. She will confirm this with the facility DON.     Expected Discharge Plan: Skilled Nursing Facility Barriers to Discharge: Continued Medical Work up  Expected Discharge Plan and Services     Post Acute Care Choice: Resumption of Svcs/PTA Provider Living arrangements for the past 2 months: Skilled Nursing Facility                                       Social Determinants of Health (SDOH) Interventions SDOH Screenings   Food Insecurity: No Food Insecurity (06/14/2024)  Housing: Low Risk  (06/14/2024)  Transportation Needs: No Transportation Needs (06/14/2024)  Utilities: Not At Risk (06/14/2024)  Financial Resource Strain: High Risk (05/24/2024)  Social Connections: Socially Isolated (06/14/2024)  Tobacco Use: Medium Risk (06/14/2024)    Readmission Risk Interventions     No data to display

## 2024-06-25 DIAGNOSIS — J9601 Acute respiratory failure with hypoxia: Secondary | ICD-10-CM | POA: Diagnosis not present

## 2024-06-25 DIAGNOSIS — S81812A Laceration without foreign body, left lower leg, initial encounter: Secondary | ICD-10-CM | POA: Diagnosis not present

## 2024-06-25 DIAGNOSIS — I5033 Acute on chronic diastolic (congestive) heart failure: Secondary | ICD-10-CM

## 2024-06-25 DIAGNOSIS — E063 Autoimmune thyroiditis: Secondary | ICD-10-CM

## 2024-06-25 DIAGNOSIS — D62 Acute posthemorrhagic anemia: Secondary | ICD-10-CM | POA: Diagnosis not present

## 2024-06-25 LAB — BASIC METABOLIC PANEL WITH GFR
Anion gap: 12 (ref 5–15)
BUN: 49 mg/dL — ABNORMAL HIGH (ref 8–23)
CO2: 41 mmol/L — ABNORMAL HIGH (ref 22–32)
Calcium: 9.5 mg/dL (ref 8.9–10.3)
Chloride: 82 mmol/L — ABNORMAL LOW (ref 98–111)
Creatinine, Ser: 0.99 mg/dL (ref 0.44–1.00)
GFR, Estimated: >60 mL/min (ref >60)
Glucose, Bld: 86 mg/dL (ref 70–99)
Potassium: 3.9 mmol/L (ref 3.5–5.1)
Sodium: 135 mmol/L (ref 135–145)

## 2024-06-25 LAB — HEMOGLOBIN: Hemoglobin: 9.6 g/dL — ABNORMAL LOW (ref 12.0–15.0)

## 2024-06-25 NOTE — Plan of Care (Signed)

## 2024-06-25 NOTE — Progress Notes (Signed)
 PROGRESS NOTE    [name]  FMW:[id number] DOB: [birth year] DOA: 06/14/2024 PCP: [name] DASEN, PA-C  246A/246A-AA  LOS: 11 days   Brief hospital course: [name] is a 68 y.o. female with medical history significant for morbid obesity with BMI of 45.73, history of atrial fibrillation status post cardioversion 05/25 on chronic anticoagulation therapy, history of DVT, chronic respiratory failure on 6 L via nasal cannula and BiPAP at night, history of hypothyroidism, rheumatoid arthritis, varicose veins involving her lower extremities, history of recurrent epistaxis who presented to the emergency room from the skilled nursing facility where she resides for evaluation of change in mental status.   She was found by the nursing home staff on the floor and hypoxic with room air pulse oximetry in the 60s.  7/10: Hemodynamically stable but remained on 10 L of oxygen.  Facility wants her to be at baseline before coming back.  Cardiology recommending restarting Xarelto  with a close monitoring.  7/11: Hemodynamically stable and able to wean back to baseline oxygen use of 6 L.  Patient need to have a functional BiPAP at her facility to avoid further hospitalizations. Facility would like to monitor for another day before taking her back.  Assessment & Plan:    Acute on chronic hypoxic and hypercapnic respiratory failure (HCC) COPD on 6L O2 at baseline --found with decreased responsiveness on room air pulse oximetry in the 60s --VBG showed uncompensated respiratory acidosis. --currently on 10L HFNC. --pulm and cardio consulted --Continue supplemental O2 to keep sats >=90%, wean as tolerated --BiPAP nightly -- Apparently she was not getting proper BiPAP at her facility.   Acute on chronic heart failure with preserved ejection fraction (HFpEF) (HCC) -- LVEF of 50 to 55% from a 2D echocardiogram which was done 06/25 --has been receiving IV lasix  40 BID, but then diuretics held due to  Cr increase --cardio consulted --home oral lasix  80 mg daily resumed on 7/8. -- Continuing Lasix  at 40 mg daily now as creatinine started improving and clinically appears euvolemic  COPD exacerbation --no cough, no sputum production, however, no able to wean down from 10L.  On chronic prednisone  5 mg for RA. --pulm consulted --received prednisone  40 mg daily x5, start to taper by 10 mg  --cont Brovana  nebs, pulmicort  nebs, DuoNebs, Singulair  and Yupelri   Laceration of left leg  status post suture in the ER --Left knee x-ray showed no acute findings. Left knee osteoarthritis, worst in the medial compartment. --curbsided Gen surgery-- recommends empiric antibiotic, completed 7 days of Bactrim .  Still has extensive bruising and edema around left knee, but overall slowly looking better (see photos). --dressing change daily with xerofoam; pre-medicate with IV morphine  15 min prior to dressing change. -- sutures to be removed in two weeks -asked nursing staff to consult wound care and sutures to be removed if appears healthy   ABLA (acute blood loss anemia) history of chronic anemia --Secondary to left lower extremity laceration following a fall in the setting of chronic anticoagulant use and varicose veins. Hemoglobin now improved to 9.6 --s/p 3u pRBC --cont iron suppl --monitor Hgb and transfuse to keep Hgb >7   Paroxysmal Atrial fibrillation (HCC) Maintaining normal sinus rhythm Amiodarone  held by cardiology in the setting of lung disease Prior cardioversion May [facility]25 at [facility] --cont cardizem  240 mg daily -- Xarelto  was initially held due to increased risk of fall, cardiology recommending restarting with a close monitoring. --Continue with Xarelto    Obesity, Class III, BMI 40-49.9 (morbid  obesity) --Complicates overall prognosis and care   Rheumatoid arthritis (HCC) --Continue prednisone  and hydroxychloroquine    Hypothyroidism --Continue  Synthroid   Hypokalemia Hypomag --monitor and supplement PRN    DVT prophylaxis: Lovenox  SQ Code Status: Full code  Family Communication:  Level of care: Progressive Dispo:   The patient is from: SNF Anticipated d/c is to: SNF Anticipated d/c date is: Likely can go tomorrow if remains stable   Subjective and Interval History:  Patient was feeling much improved when seen today.  Denies any new concern.  She was asking to go back to her place.  Objective: Vitals:   06/25/24 0716 06/25/24 0723 06/25/24 1129 06/25/24 1323  BP: 111/69  113/77   Pulse: 64  67   Resp: 18  19   Temp: 98.9 F (37.2 C)  97.6 F (36.4 C)   TempSrc:      SpO2: 99% 92% 98% 99%  Weight:      Height:        Intake/Output Summary (Last 24 hours) at 06/25/2024 1440 Last data filed at 06/25/2024 0900 Gross per 24 hour  Intake 960 ml  Output 2000 ml  Net -1040 ml   Filed Weights   06/14/24 0938 06/23/24 1600  Weight: 113.4 kg 98.5 kg    Examination:   General.  Obese lady, in no acute distress. Pulmonary.  Lungs clear bilaterally, normal respiratory effort. CV.  Regular rate and rhythm, no JVD, rub or murmur. Abdomen.  Soft, nontender, nondistended, BS positive. CNS.  Alert and oriented .  No focal neurologic deficit. Extremities.  No edema,  pulses intact and symmetrical. Psychiatry.  Judgment and insight appears normal.    Photo taken on 7/8   Data Reviewed: I have personally reviewed labs and imaging studies  Time spent: 45 minutes  This record has been created using Conservation officer, historic buildings. Errors have been sought and corrected,but may not always be located. Such creation errors do not reflect on the standard of care.   Amaryllis Dare, MD [facility] If 7PM-7AM, please contact night-coverage 06/25/2024, 2:40 PM

## 2024-06-25 NOTE — Plan of Care (Signed)

## 2024-06-25 NOTE — TOC Progression Note (Signed)
 Transition of Care [facility]) - Progression Note    Patient Details  Name: [name] MRN: [id number] Date of Birth: 1956-12-12  Transition of Care [facility]) CM/SW Contact  Tomasa JAYSON Childes, RN Phone Number: 06/25/2024, 11:02 AM  Clinical Narrative:    Per message received from MD, patient is back to baseline oxygen of 6 liters.   Contacted Tammy from [facility]. She will check with DON to see if patient can return today.  MD notified.     Expected Discharge Plan: Skilled Nursing Facility Barriers to Discharge: Continued Medical Work up  Expected Discharge Plan and Services     Post Acute Care Choice: Resumption of Svcs/PTA Provider Living arrangements for the past 2 months: Skilled Nursing Facility                                       Social Determinants of Health (SDOH) Interventions SDOH Screenings   Food Insecurity: No Food Insecurity (06/14/2024)  Housing: Low Risk  (06/14/2024)  Transportation Needs: No Transportation Needs (06/14/2024)  Utilities: Not At Risk (06/14/2024)  Financial Resource Strain: High Risk (05/24/2024)  Social Connections: Socially Isolated (06/14/2024)  Tobacco Use: Medium Risk (06/14/2024)    Readmission Risk Interventions     No data to display

## 2024-06-25 NOTE — Progress Notes (Signed)
 Physical Therapy Treatment Patient Details Name: [name] MRN: [id number] DOB: 1956/03/14 Today's Date: 06/25/2024   History of Present Illness [name] is a 68 y.o. female with medical history significant for morbid obesity with BMI of 45.73, history of atrial fibrillation status post cardioversion 05/25 on chronic anticoagulation therapy, history of DVT, chronic respiratory failure on 6 L via nasal cannula and BiPAP at night, history of hypothyroidism, rheumatoid arthritis, varicose veins involving her lower extremities, history of recurrent epistaxis who presents to the emergency room from the skilled nursing facility where she resides for evaluation of change in mental status and fall resulting in LLE laceration.    PT Comments  Patient supine in bed on arrival and in agreement to PT/OT co-tx session to maximize patient's tolerance. On 6L O2 during session with spO2 dropping as low as 85% with activity. Complaining of 6-7/10 pain in LLE. Completed bed mobility with supervision and sit to stand with CGA+2 and RW. Able to take sidesteps towards [facility] with RW and CGA+2 for safety. Cues for pursed lip breathing following activity. Patient with noted skin breakdown between thighs and placed barrier cream to those areas. Discharge plan remains appropriate.     If plan is discharge home, recommend the following: Assistance with cooking/housework;Assist for transportation;Help with stairs or ramp for entrance;Two people to help with walking and/or transfers;Two people to help with bathing/dressing/bathroom   Can travel by private vehicle     No  Equipment Recommendations  None recommended by PT    Recommendations for Other Services       Precautions / Restrictions Precautions Precautions: Fall Recall of Precautions/Restrictions: Intact Restrictions Weight Bearing Restrictions Per Provider Order: No     Mobility  Bed Mobility Overal bed mobility: Needs Assistance Bed Mobility:  Supine to Sit, Sit to Supine     Supine to sit: Supervision Sit to supine: Supervision        Transfers Overall transfer level: Needs assistance Equipment used: Rolling Thereasa Iannello (2 wheels) Transfers: Sit to/from Stand Sit to Stand: Contact guard assist, +2 physical assistance, From elevated surface           General transfer comment: VC for anterior weight shift prior to standing to improve transfer    Ambulation/Gait Ambulation/Gait assistance: Contact guard assist, +2 safety/equipment Gait Distance (Feet): 3 Feet Assistive device: Rolling Jerimey Burridge (2 wheels) Gait Pattern/deviations: Step-to pattern Gait velocity: decreased     General Gait Details: CGA for safety. On 6L with spO2 dropping to 85%. Cues for pursed lip breathing   Stairs             Wheelchair Mobility     Tilt Bed    Modified Rankin (Stroke Patients Only)       Balance Overall balance assessment: Needs assistance Sitting-balance support: Single extremity supported, Bilateral upper extremity supported, Feet supported Sitting balance-Leahy Scale: Fair     Standing balance support: Bilateral upper extremity supported, Reliant on assistive device for balance, During functional activity Standing balance-Leahy Scale: Fair                              Hotel manager: No apparent difficulties  Cognition Arousal: Alert Behavior During Therapy: WFL for tasks assessed/performed, Anxious   PT - Cognitive impairments: No apparent impairments                         Following commands: Intact  Cueing Cueing Techniques: Verbal cues, Tactile cues, Visual cues  Exercises      General Comments General comments (skin integrity, edema, etc.): On 6L O2 throughout, drops to 85% with exertion but improves to >90% with VC For PLB and time. Noted some small skin breakdown, RN notified      Pertinent Vitals/Pain Pain Assessment Pain Assessment:  0-10 Pain Score: 7  Pain Location: LLE with weight bearing Pain Descriptors / Indicators: Discomfort, Grimacing, Guarding Pain Intervention(s): Limited activity within patient's tolerance, Monitored during session, Repositioned    Home Living                          Prior Function            PT Goals (current goals can now be found in the care plan section) Acute Rehab PT Goals PT Goal Formulation: With patient Time For Goal Achievement: 07/01/24 Potential to Achieve Goals: Good Progress towards PT goals: Progressing toward goals    Frequency    Min 2X/week      PT Plan      Co-evaluation   Reason for Co-Treatment: Complexity of the patient's impairments (multi-system involvement);For patient/therapist safety;To address functional/ADL transfers PT goals addressed during session: Mobility/safety with mobility;Balance OT goals addressed during session: ADL's and self-care      AM-PAC PT 6 Clicks Mobility   Outcome Measure  Help needed turning from your back to your side while in a flat bed without using bedrails?: A Little Help needed moving from lying on your back to sitting on the side of a flat bed without using bedrails?: A Little Help needed moving to and from a bed to a chair (including a wheelchair)?: A Little Help needed standing up from a chair using your arms (e.g., wheelchair or bedside chair)?: A Little Help needed to walk in hospital room?: A Lot Help needed climbing 3-5 steps with a railing? : Total 6 Click Score: 15    End of Session Equipment Utilized During Treatment: Oxygen Activity Tolerance: Patient limited by fatigue Patient left: in bed;with call bell/phone within reach;with bed alarm set Nurse Communication: Mobility status PT Visit Diagnosis: Repeated falls (R29.6);Muscle weakness (generalized) (M62.81);History of falling (Z91.81);Unsteadiness on feet (R26.81);Difficulty in walking, not elsewhere classified (R26.2);Other  abnormalities of gait and mobility (R26.89)     Time: 8587-8554 PT Time Calculation (min) (ACUTE ONLY): 33 min  Charges:    $Therapeutic Activity: 8-22 mins PT General Charges $$ ACUTE PT VISIT: 1 Visit                     [name], PT, DPT Physical Therapist - [facility]  [facility]    Arvetta Araque A Mima Cranmore 06/25/2024, 3:29 PM

## 2024-06-25 NOTE — Progress Notes (Signed)
 Bipap alarming leakage too high, mask readjusted to improve fit and leakage %. Leakage Currently 51-59.

## 2024-06-25 NOTE — Consult Note (Addendum)
 WOC Nurse Consult Note: Reason for Consult: RLE wound with sutures  Wound type: full thickness RLE post trauma from fall, 10 sutures placed in ED 06/14/2024  Pressure Injury POA: NA  Measurement: see nursing flowsheet  Wound bed: 10 intact sutures with adjacent open wound bed with fibrinous tissue  Drainage (amount, consistency, odor) see nursing flowsheet  Periwound: ecchymosis and area of hematoma that has improved from previous photos  Dressing procedure/placement/frequency:  Open wound bed appears dry and will add Vashe to add moisture and clean up wound bed.   Cleanse R knee/leg laceration with Vashe wound cleanser Soila [phone]) do not rinse and allow to air dry. Apply Vashe moistened gauze to open wound bed and cover with small dry gauze. Apply Xeroform gauze (Lawson [phone]) to intact sutures and area of ecchymosis/hematoma.  Cover entire dressing with ABD pad and Kerlix roll gauze.  May cover with Ace bandage wrapped in same fashion as Kerlix to help secure dressing.   Patient is being discharged to SNF and should have ongoing care/management of this wound.  It appears that wound will likely open up more with removal of sutures.    Secure chat to Dr. Caleen and primary nurse regarding above.  WOC team will not follow. Re-consult if further needs arise.   Thank you,    Powell Bar MSN, RN-BC, [employer]

## 2024-06-25 NOTE — Progress Notes (Signed)
 Occupational Therapy Treatment Patient Details Name: [name] MRN: [id number] DOB: [birth year] Today's Date: 06/25/2024   History of present illness AIRYONNA FRANKLYN is a 68 y.o. female with medical history significant for morbid obesity with BMI of 45.73, history of atrial fibrillation status post cardioversion 05/25 on chronic anticoagulation therapy, history of DVT, chronic respiratory failure on 6 L via nasal cannula and BiPAP at night, history of hypothyroidism, rheumatoid arthritis, varicose veins involving her lower extremities, history of recurrent epistaxis who presents to the emergency room from the skilled nursing facility where she resides for evaluation of change in mental status and fall resulting in LLE laceration.   OT comments  Pt seen for OT and PT co-tx to optimize ADL/mobility efforts. Pt endorsing feeling better today. On 6L throughout. Endorses 6/10 LLE pain. RN notified. Pt agreeable to session. Pt required increased time and significant effort but able to complete bed mobility with sup-CGA only. STS with CGA+2 and RW, and took a few lateral steps EOB with CGA+2 with RW. VC for PLB throughout session. Educated in minimizing multitasking (talking, standing/walking) to support breath recovery. MAX A in standing to complete pericare after bed noted to be saturated with urine 2/2 malfunctioning purewick. Once returned to bed, MAX A for pericare and placement of Purewick. RN notified of skin breakdown noted. SpO2 desat to 85% on 6L, improving to >90% with time and PLB. Rest breaks throughout activity as needed by pt. Pt progressing.       If plan is discharge home, recommend the following:  Assist for transportation;Assistance with cooking/housework;Help with stairs or ramp for entrance;A lot of help with bathing/dressing/bathroom;A lot of help with walking and/or transfers   Equipment Recommendations  Other (comment) (defer)    Recommendations for Other Services       Precautions / Restrictions Precautions Precautions: Fall Recall of Precautions/Restrictions: Intact Restrictions Weight Bearing Restrictions Per Provider Order: No       Mobility Bed Mobility Overal bed mobility: Needs Assistance Bed Mobility: Supine to Sit, Sit to Supine     Supine to sit: Contact guard, HOB elevated, Used rails, Supervision, +2 for safety/equipment Sit to supine: Supervision, +2 for safety/equipment     Patient Response: Anxious, Cooperative  Transfers Overall transfer level: Needs assistance Equipment used: Rolling walker (2 wheels) Transfers: Sit to/from Stand Sit to Stand: Contact guard assist, +2 physical assistance, From elevated surface           General transfer comment: VC for anterior weight shift prior to standing to improve transfer     Balance Overall balance assessment: Needs assistance Sitting-balance support: Single extremity supported, Bilateral upper extremity supported, Feet supported Sitting balance-Leahy Scale: Fair     Standing balance support: Bilateral upper extremity supported, Reliant on assistive device for balance, During functional activity Standing balance-Leahy Scale: Fair                             ADL either performed or assessed with clinical judgement   ADL Overall ADL's : Needs assistance/impaired     Grooming: Sitting;Wash/dry face;Wash/dry hands;Set up;Supervision/safety               Lower Body Dressing: Bed level;Maximal assistance Lower Body Dressing Details (indicate cue type and reason): to don bil socks; pt able to lift foot off bed to assist     Toileting- Clothing Manipulation and Hygiene: Sit to/from stand;Bed level;Maximal assistance Toileting - Clothing Manipulation Details (indicate cue  type and reason): MAX A in standing to cleanse skin, MAX A supine in bed to cleanse areas prior to replacement of PureWick     Functional mobility during ADLs: Contact guard assist;Rolling  walker (2 wheels);+2 for physical assistance      Extremity/Trunk Assessment              Vision       Perception     Praxis     Communication Communication Communication: No apparent difficulties   Cognition Arousal: Alert Behavior During Therapy: WFL for tasks assessed/performed, Anxious Cognition: No apparent impairments                               Following commands: Intact        Cueing   Cueing Techniques: Verbal cues, Tactile cues, Visual cues  Exercises      Shoulder Instructions       General Comments On 6L O2 throughout, drops to 85% with exertion but improves to >90% with VC For PLB and time. Noted some small skin breakdown, RN notified    Pertinent Vitals/ Pain       Pain Assessment Pain Assessment: 0-10 Pain Score: 7  Pain Location: LLE with weight bearing Pain Descriptors / Indicators: Discomfort, Grimacing, Guarding Pain Intervention(s): Limited activity within patient's tolerance, Monitored during session, Repositioned, Patient requesting pain meds-RN notified, RN gave pain meds during session  Home Living                                          Prior Functioning/Environment              Frequency  Min 2X/week        Progress Toward Goals  OT Goals(current goals can now be found in the care plan section)  Progress towards OT goals: Progressing toward goals  Acute Rehab OT Goals Patient Stated Goal: improve function and breathing OT Goal Formulation: With patient Time For Goal Achievement: 07/01/24 Potential to Achieve Goals: Fair  Plan      Co-evaluation    PT/OT/SLP Co-Evaluation/Treatment: Yes Reason for Co-Treatment: Complexity of the patient's impairments (multi-system involvement);For patient/therapist safety;To address functional/ADL transfers PT goals addressed during session: Mobility/safety with mobility;Balance OT goals addressed during session: ADL's and self-care       AM-PAC OT 6 Clicks Daily Activity     Outcome Measure   Help from another person eating meals?: None Help from another person taking care of personal grooming?: A Little Help from another person toileting, which includes using toliet, bedpan, or urinal?: A Lot Help from another person bathing (including washing, rinsing, drying)?: A Lot Help from another person to put on and taking off regular upper body clothing?: A Little Help from another person to put on and taking off regular lower body clothing?: A Lot 6 Click Score: 16    End of Session Equipment Utilized During Treatment: Oxygen;Rolling walker (2 wheels)  OT Visit Diagnosis: Other abnormalities of gait and mobility (R26.89);Pain Pain - Right/Left: Left Pain - part of body: Leg   Activity Tolerance Patient tolerated treatment well   Patient Left in bed;with call bell/phone within reach;with bed alarm set   Nurse Communication Mobility status;Patient requests pain meds;Other (comment) (skin  breakdown, SpO2)        Time: 8587-8554 OT Time Calculation (min): 33  min  Charges: OT General Charges $OT Visit: 1 Visit OT Treatments $Self Care/Home Management : 8-22 mins  Warren SAUNDERS., MPH, MS, OTR/L ascom [phone] 06/25/24, 3:14 PM

## 2024-06-26 DIAGNOSIS — J9601 Acute respiratory failure with hypoxia: Secondary | ICD-10-CM | POA: Diagnosis not present

## 2024-06-26 DIAGNOSIS — I5033 Acute on chronic diastolic (congestive) heart failure: Secondary | ICD-10-CM | POA: Diagnosis not present

## 2024-06-26 DIAGNOSIS — S81812A Laceration without foreign body, left lower leg, initial encounter: Secondary | ICD-10-CM | POA: Diagnosis not present

## 2024-06-26 DIAGNOSIS — D62 Acute posthemorrhagic anemia: Secondary | ICD-10-CM | POA: Diagnosis not present

## 2024-06-26 LAB — BASIC METABOLIC PANEL WITH GFR
Anion gap: 9 (ref 5–15)
BUN: 50 mg/dL — ABNORMAL HIGH (ref 8–23)
CO2: 40 mmol/L — ABNORMAL HIGH (ref 22–32)
Calcium: 9.7 mg/dL (ref 8.9–10.3)
Chloride: 86 mmol/L — ABNORMAL LOW (ref 98–111)
Creatinine, Ser: 1.01 mg/dL — ABNORMAL HIGH (ref 0.44–1.00)
GFR, Estimated: >60 mL/min (ref >60)
Glucose, Bld: 88 mg/dL (ref 70–99)
Potassium: 3.7 mmol/L (ref 3.5–5.1)
Sodium: 135 mmol/L (ref 135–145)

## 2024-06-26 NOTE — TOC Progression Note (Addendum)
 Transition of Care [facility]) - Progression Note    Patient Details  Name: [name] MRN: [id number] Date of Birth: 12/05/1956  Transition of Care [facility]) CM/SW Contact  [place], [place] Phone Number: 06/26/2024, 11:55 AM  Clinical Narrative:     CSW received a call from Kevin Darden, administrator for [facility] concerned. They are saying they are concerned about the huge jump from 12L to 6L. They are saying it is not a safe discharge. They are not opposed to taking her back but they are concerned about her frequent hospitalizations. They are committed to taking care of her but they want to make sure she is safe in their environment. They are requesting that patient consistently shows that she can maintain 6L for at least 72 hours.   Attending advised and agreed to consider discharge on Monday.   Expected Discharge Plan: Skilled Nursing Facility Barriers to Discharge: Continued Medical Work up  Expected Discharge Plan and Services     Post Acute Care Choice: Resumption of Svcs/PTA Provider Living arrangements for the past 2 months: Skilled Nursing Facility                                       Social Determinants of Health (SDOH) Interventions SDOH Screenings   Food Insecurity: No Food Insecurity (06/14/2024)  Housing: Low Risk  (06/14/2024)  Transportation Needs: No Transportation Needs (06/14/2024)  Utilities: Not At Risk (06/14/2024)  Financial Resource Strain: High Risk (05/24/2024)  Social Connections: Socially Isolated (06/14/2024)  Tobacco Use: Medium Risk (06/14/2024)    Readmission Risk Interventions     No data to display

## 2024-06-26 NOTE — Plan of Care (Signed)

## 2024-06-26 NOTE — Progress Notes (Signed)
 PROGRESS NOTE    [name]  FMW:[id number] DOB: [birth year] DOA: 06/14/2024 PCP: [name] DASEN, PA-C  [id number]-AA  LOS: 12 days   Brief hospital course: [name] is a 68 y.o. female with medical history significant for morbid obesity with BMI of 45.73, history of atrial fibrillation status post cardioversion 05/25 on chronic anticoagulation therapy, history of DVT, chronic respiratory failure on 6 L via nasal cannula and BiPAP at night, history of hypothyroidism, rheumatoid arthritis, varicose veins involving her lower extremities, history of recurrent epistaxis who presented to the emergency room from the skilled nursing facility where she resides for evaluation of change in mental status.   She was found by the nursing home staff on the floor and hypoxic with room air pulse oximetry in the 60s.  7/10: Hemodynamically stable but remained on 10 L of oxygen.  Facility wants her to be at baseline before coming back.  Cardiology recommending restarting Xarelto  with a close monitoring.  7/11: Hemodynamically stable and able to wean back to baseline oxygen use of 6 L.  Patient need to have a functional BiPAP at her facility to avoid further hospitalizations. Facility would like to monitor for another day before taking her back.  7/12: Remained stable on 6L, facility wants to remain on 6L for at least 72 hours before coming back, they will take her back on Monday. Wound care reevaluated her, staples were not removed for concern of that it might result opening of her wound.  They advised wet-to-dry dressing.  Staples to stay in for another week  Assessment & Plan:    Acute on chronic hypoxic and hypercapnic respiratory failure (HCC) COPD on 6L O2 at baseline --found with decreased responsiveness on room air pulse oximetry in the 60s --VBG showed uncompensated respiratory acidosis. --currently on 6 L HFNC. --pulm and cardio consulted --Continue supplemental O2 to keep sats  >=90%, wean as tolerated --BiPAP nightly -- Apparently she was not getting proper BiPAP at her facility.   Acute on chronic heart failure with preserved ejection fraction (HFpEF) (HCC) -- LVEF of 50 to 55% from a 2D echocardiogram which was done 06/25 --has been receiving IV lasix  40 BID, but then diuretics held due to Cr increase --cardio consulted --home oral lasix  80 mg daily resumed on 7/8. -- Continuing Lasix  at 40 mg daily now as creatinine started improving and clinically appears euvolemic  COPD exacerbation --no cough, no sputum production, however, no able to wean down from 10L.  On chronic prednisone  5 mg for RA. --pulm consulted --received prednisone  40 mg daily x5, start to taper by 10 mg  --cont Brovana  nebs, pulmicort  nebs, DuoNebs, Singulair  and Yupelri   Laceration of left leg  status post suture in the ER --Left knee x-ray showed no acute findings. Left knee osteoarthritis, worst in the medial compartment. --curbsided Gen surgery-- recommends empiric antibiotic, completed 7 days of Bactrim .  Still has extensive bruising and edema around left knee, but overall slowly looking better (see photos). --dressing change daily with xerofoam; pre-medicate with IV morphine  15 min prior to dressing change. -- Keeping sutures for another week as she was reevaluated by wound care yesterday and there is a concern that if staples were removed her wound might get opened-ADV using wet-to-dry daily dressing.   ABLA (acute blood loss anemia) history of chronic anemia --Secondary to left lower extremity laceration following a fall in the setting of chronic anticoagulant use and varicose veins. Hemoglobin now improved to 9.6 --s/p 3u pRBC --cont  iron suppl --monitor Hgb and transfuse to keep Hgb >7   Paroxysmal Atrial fibrillation (HCC) Maintaining normal sinus rhythm Amiodarone  held by cardiology in the setting of lung disease Prior [facility] at [facility] --cont cardizem  240  mg daily -- Xarelto  was initially held due to increased risk of fall, cardiology recommending restarting with a close monitoring. --Continue with Xarelto    Obesity, Class III, BMI 40-49.9 (morbid obesity) --Complicates overall prognosis and care   Rheumatoid arthritis (HCC) --Continue prednisone  and hydroxychloroquine    Hypothyroidism --Continue Synthroid   Hypokalemia Hypomag --monitor and supplement PRN    DVT prophylaxis: Lovenox  SQ Code Status: Full code  Family Communication:  Level of care: Progressive Dispo:   The patient is from: SNF Anticipated d/c is to: SNF Anticipated d/c date is: Medically stable but facility will take her back on Monday if remained on 6 L of oxygen.   Subjective and Interval History:  Patient was seen and examined today.  No new concern.  Objective: Vitals:   06/26/24 0717 06/26/24 0737 06/26/24 1200 06/26/24 1334  BP:  110/61 96/64   Pulse:  (!) 57 71   Resp:  16 16   Temp:  (!) 97.5 F (36.4 C) 98.5 F (36.9 C)   TempSrc:      SpO2: 97% 100% 96% 95%  Weight:      Height:        Intake/Output Summary (Last 24 hours) at 06/26/2024 1621 Last data filed at 06/26/2024 1405 Gross per 24 hour  Intake 840 ml  Output 2600 ml  Net -1760 ml   Filed Weights   06/14/24 0938 06/23/24 1600  Weight: 113.4 kg 98.5 kg    Examination:   General.  Obese lady, in no acute distress. Pulmonary.  Lungs clear bilaterally, normal respiratory effort. CV.  Regular rate and rhythm, no JVD, rub or murmur. Abdomen.  Soft, nontender, nondistended, BS positive. CNS.  Alert and oriented .  No focal neurologic deficit. Extremities.  No edema, clean dressing on left leg Psychiatry.  Judgment and insight appears normal.    Photo taken on 7/8   Data Reviewed: I have personally reviewed labs and imaging studies  Time spent: 44 minutes  This record has been created using Conservation officer, historic buildings. Errors have been sought and corrected,but may  not always be located. Such creation errors do not reflect on the standard of care.   Amaryllis Dare, MD [facility] If 7PM-7AM, please contact night-coverage 06/26/2024, 4:21 PM

## 2024-06-26 NOTE — Plan of Care (Signed)

## 2024-06-27 DIAGNOSIS — I5033 Acute on chronic diastolic (congestive) heart failure: Secondary | ICD-10-CM | POA: Diagnosis not present

## 2024-06-27 DIAGNOSIS — S81812A Laceration without foreign body, left lower leg, initial encounter: Secondary | ICD-10-CM | POA: Diagnosis not present

## 2024-06-27 DIAGNOSIS — D62 Acute posthemorrhagic anemia: Secondary | ICD-10-CM | POA: Diagnosis not present

## 2024-06-27 DIAGNOSIS — J9601 Acute respiratory failure with hypoxia: Secondary | ICD-10-CM | POA: Diagnosis not present

## 2024-06-27 LAB — BASIC METABOLIC PANEL WITH GFR
Anion gap: 11 (ref 5–15)
BUN: 47 mg/dL — ABNORMAL HIGH (ref 8–23)
CO2: 36 mmol/L — ABNORMAL HIGH (ref 22–32)
Calcium: 9.2 mg/dL (ref 8.9–10.3)
Chloride: 87 mmol/L — ABNORMAL LOW (ref 98–111)
Creatinine, Ser: 0.9 mg/dL (ref 0.44–1.00)
GFR, Estimated: >60 mL/min (ref >60)
Glucose, Bld: 85 mg/dL (ref 70–99)
Potassium: 3.6 mmol/L (ref 3.5–5.1)
Sodium: 134 mmol/L — ABNORMAL LOW (ref 135–145)

## 2024-06-27 NOTE — Plan of Care (Signed)
  Problem: Clinical Measurements: Goal: Ability to maintain clinical measurements within normal limits will improve Outcome: Progressing Goal: Will remain free from infection Outcome: Progressing Goal: Cardiovascular complication will be avoided Outcome: Progressing   Problem: Pain Managment: Goal: General experience of comfort will improve and/or be controlled Outcome: Progressing   Problem: Safety: Goal: Ability to remain free from injury will improve Outcome: Progressing

## 2024-06-27 NOTE — Progress Notes (Signed)
 PROGRESS NOTE    [name]  FMW:[id number] DOB: June 27, 1956 DOA: 06/14/2024 PCP: [name] DASEN, PA-C  246A/246A-AA  LOS: 13 days   Brief hospital course: [name] is a 68 y.o. female with medical history significant for morbid obesity with BMI of 45.73, history of atrial fibrillation status post cardioversion 05/25 on chronic anticoagulation therapy, history of DVT, chronic respiratory failure on 6 L via nasal cannula and BiPAP at night, history of hypothyroidism, rheumatoid arthritis, varicose veins involving her lower extremities, history of recurrent epistaxis who presented to the emergency room from the skilled nursing facility where she resides for evaluation of change in mental status.   She was found by the nursing home staff on the floor and hypoxic with room air pulse oximetry in the 60s.  7/10: Hemodynamically stable but remained on 10 L of oxygen.  Facility wants her to be at baseline before coming back.  Cardiology recommending restarting Xarelto  with a close monitoring.  7/11: Hemodynamically stable and able to wean back to baseline oxygen use of 6 L.  Patient need to have a functional BiPAP at her facility to avoid further hospitalizations. Facility would like to monitor for another day before taking her back.  7/12: Remained stable on 6L, facility wants to remain on 6L for at least 72 hours before coming back, they will take her back on Monday. Wound care reevaluated her, staples were not removed for concern of that it might result opening of her wound.  They advised wet-to-dry dressing.  Staples to stay in for another week.  7/13: Remained hemodynamically stable on 6L.  Hopefully can go back to her facility tomorrow  Assessment & Plan:    Acute on chronic hypoxic and hypercapnic respiratory failure (HCC) COPD on 6L O2 at baseline --found with decreased responsiveness on room air pulse oximetry in the 60s --VBG showed uncompensated respiratory  acidosis. --currently on 6 L HFNC. --pulm and cardio consulted --Continue supplemental O2 to keep sats >=90%, wean as tolerated --BiPAP nightly -- Apparently she was not getting proper BiPAP at her facility.   Acute on chronic heart failure with preserved ejection fraction (HFpEF) (HCC) -- LVEF of 50 to 55% from a 2D echocardiogram which was done 06/25 --has been receiving IV lasix  40 BID, but then diuretics held due to Cr increase --cardio consulted --home oral lasix  80 mg daily resumed on 7/8. -- Continuing Lasix  at 40 mg daily now as creatinine started improving and clinically appears euvolemic  COPD exacerbation --no cough, no sputum production, however, now able to wean down from 10L.  On chronic prednisone  5 mg for RA. --pulm consulted --received prednisone  40 mg daily x5, start to taper by 10 mg  --cont Brovana  nebs, pulmicort  nebs, DuoNebs, Singulair  and Yupelri   Laceration of left leg  status post suture in the ER --Left knee x-ray showed no acute findings. Left knee osteoarthritis, worst in the medial compartment. --curbsided Gen surgery-- recommends empiric antibiotic, completed 7 days of Bactrim .  Still has extensive bruising and edema around left knee, but overall slowly looking better (see photos). --dressing change daily with xerofoam; pre-medicate with IV morphine  15 min prior to dressing change. -- Keeping sutures for another week as she was reevaluated by wound care yesterday and there is a concern that if staples were removed her wound might get opened-ADV using wet-to-dry daily dressing.   ABLA (acute blood loss anemia) history of chronic anemia --Secondary to left lower extremity laceration following a fall in the setting  of chronic anticoagulant use and varicose veins. Hemoglobin now improved to 9.6 --s/p 3u pRBC --cont iron suppl --monitor Hgb and transfuse to keep Hgb >7   Paroxysmal Atrial fibrillation (HCC) Maintaining normal sinus rhythm Amiodarone  held  by cardiology in the setting of lung disease Prior [facility]25 at [facility] --cont cardizem  240 mg daily -- Xarelto  was initially held due to increased risk of fall, cardiology recommending restarting with a close monitoring. --Continue with Xarelto    Obesity, Class III, BMI 40-49.9 (morbid obesity) --Complicates overall prognosis and care   Rheumatoid arthritis (HCC) --Continue prednisone  and hydroxychloroquine    Hypothyroidism --Continue Synthroid   Hypokalemia Hypomag --monitor and supplement PRN    DVT prophylaxis: Lovenox  SQ Code Status: Full code  Family Communication:  Level of care: Progressive Dispo:   The patient is from: SNF Anticipated d/c is to: SNF Anticipated d/c date is: Medically stable but facility will take her back on Monday if remained on 6 L of oxygen.   Subjective and Interval History:  Patient was sleeping comfortably with BiPAP when seen today.  Easily arousable.  No new concern.  Objective: Vitals:   06/27/24 0713 06/27/24 0758 06/27/24 1256 06/27/24 1343  BP:  100/66 112/63   Pulse:  (!) 51 67   Resp:  16 16   Temp:      TempSrc:      SpO2: 98% 100% 99% 98%  Weight:      Height:        Intake/Output Summary (Last 24 hours) at 06/27/2024 1533 Last data filed at 06/27/2024 1300 Gross per 24 hour  Intake 720 ml  Output 3300 ml  Net -2580 ml   Filed Weights   06/14/24 0938 06/23/24 1600  Weight: 113.4 kg 98.5 kg    Examination:   General.  Morbidly obese lady, in no acute distress. Pulmonary.  Lungs clear bilaterally, normal respiratory effort. CV.  Regular rate and rhythm, no JVD, rub or murmur. Abdomen.  Soft, nontender, nondistended, BS positive. CNS.  Alert and oriented .  No focal neurologic deficit. Extremities.  No edema, left leg with clean bandage Psychiatry.  Judgment and insight appears normal.    Photo taken on 7/8   Data Reviewed: I have personally reviewed labs and imaging studies  Time spent: 43  minutes  This record has been created using Conservation officer, historic buildings. Errors have been sought and corrected,but may not always be located. Such creation errors do not reflect on the standard of care.   Amaryllis Dare, MD [facility] If 7PM-7AM, please contact night-coverage 06/27/2024, 3:33 PM

## 2024-06-28 DIAGNOSIS — I5033 Acute on chronic diastolic (congestive) heart failure: Secondary | ICD-10-CM | POA: Diagnosis not present

## 2024-06-28 DIAGNOSIS — J9601 Acute respiratory failure with hypoxia: Secondary | ICD-10-CM | POA: Diagnosis not present

## 2024-06-28 DIAGNOSIS — S81812A Laceration without foreign body, left lower leg, initial encounter: Secondary | ICD-10-CM | POA: Diagnosis not present

## 2024-06-28 DIAGNOSIS — D62 Acute posthemorrhagic anemia: Secondary | ICD-10-CM | POA: Diagnosis not present

## 2024-06-28 LAB — BASIC METABOLIC PANEL WITH GFR
Anion gap: 10 (ref 5–15)
BUN: 50 mg/dL — ABNORMAL HIGH (ref 8–23)
CO2: 35 mmol/L — ABNORMAL HIGH (ref 22–32)
Calcium: 8.8 mg/dL — ABNORMAL LOW (ref 8.9–10.3)
Chloride: 90 mmol/L — ABNORMAL LOW (ref 98–111)
Creatinine, Ser: 1.16 mg/dL — ABNORMAL HIGH (ref 0.44–1.00)
GFR, Estimated: 52 mL/min — ABNORMAL LOW (ref >60)
Glucose, Bld: 97 mg/dL (ref 70–99)
Potassium: 3.1 mmol/L — ABNORMAL LOW (ref 3.5–5.1)
Sodium: 135 mmol/L (ref 135–145)

## 2024-06-28 LAB — CBC
HCT: 30 % — ABNORMAL LOW (ref 36.0–46.0)
Hemoglobin: 9.1 g/dL — ABNORMAL LOW (ref 12.0–15.0)
MCH: 27.7 pg (ref 26.0–34.0)
MCHC: 30.3 g/dL (ref 30.0–36.0)
MCV: 91.5 fL (ref 80.0–100.0)
Platelets: 271 K/uL (ref 150–400)
RBC: 3.28 MIL/uL — ABNORMAL LOW (ref 3.87–5.11)
RDW: 16.2 % — ABNORMAL HIGH (ref 11.5–15.5)
WBC: 9.6 K/uL (ref 4.0–10.5)
nRBC: 0 % (ref 0.0–0.2)

## 2024-06-28 LAB — MAGNESIUM: Magnesium: 2.3 mg/dL (ref 1.7–2.4)

## 2024-06-28 MED ORDER — POTASSIUM CHLORIDE CRYS ER 20 MEQ PO TBCR
40.0000 meq | EXTENDED_RELEASE_TABLET | Freq: Once | ORAL | Status: AC
  Administered 2024-06-28: 40 meq via ORAL
  Filled 2024-06-28: qty 2

## 2024-06-28 NOTE — Plan of Care (Signed)
 PMT shadowing peripherally. Patient has weaned to 6lpm of O2 during her admission. She will need to tolerate current 6L of O2 for at least 72 hrs in order to return to her facility. During GOC discussion, patient was clear she would want full code/ full scope treatment. PMT will continue to shadow.

## 2024-06-28 NOTE — TOC Transition Note (Addendum)
 Transition of Care [facility]) - Discharge Note   Patient Details  Name: [name] MRN: [id number] Date of Birth: 1956-05-16  Transition [facility] Care [facility] CM/SW Contact:  Guliana Weyandt C Annetta Deiss, RN Phone Number: 06/28/2024, 9:51 AM   Clinical Narrative:    Per Madelin, Admissions Coordinator for [facility] patient can return to facility today.  MD notified.   Spoke with patent regarding return to [facility] today. Patient stated her BIPAP at the facility is broken. She stated the face mask now fits but the machine was broken and she had reported it to the facility.   Spoke with Tammy in admissions and Clotilda the Interior and spatial designer of Nursing. Clotilda stated the patient machine is working efficiently however she has to send the machine out to be checked to ensure it is working since the patient suspects it is not. MD made aware.   12:11pm Spoke with Delon, Daughter-in-Law. Delon stated she had spoken with Clotilda at [facility] regarding discharge plans. Delon was advised patient stated her BIPAP is not working properly. She was advised per facility the BIPAP has to be sent out to be checked. Delon stated concerns about [facility] not having an onsite RT . She is interested in facilities that have onsite RT's and is closer to [place]. She would also like to explore the option for LTACH. She has been advised patient would have to meet [facility] admissions criteria. Delon stated her understanding and reported patient had admitted to [facility] in the past. Her first option is to have patient discharge to [facility].   12:40 Per Darrian Admissions Coordinator at [facility] facility does not have LTC medicaid beds. Patient stay would be short term. She will check with Admissions Director to see if full time onsite RT is available.   Attempt to reach [employer], [facility] liasion. No answer. Unable to leave a message          Barriers to Discharge: Continued Medical Work up   Patient Goals and CMS Choice             Discharge Placement                       Discharge Plan and Services Additional resources added to the After Visit Summary for       Post Acute Care Choice: Resumption of Svcs/PTA Provider                               Social Drivers of Health (SDOH) Interventions SDOH Screenings   Food Insecurity: No Food Insecurity (06/14/2024)  Housing: Low Risk  (06/14/2024)  Transportation Needs: No Transportation Needs (06/14/2024)  Utilities: Not At Risk (06/14/2024)  Financial Resource Strain: High Risk (05/24/2024)  Social Connections: Socially Isolated (06/14/2024)  Tobacco Use: Medium Risk (06/14/2024)     Readmission Risk Interventions     No data to display

## 2024-06-28 NOTE — Progress Notes (Signed)
 PROGRESS NOTE    [name]  FMW:[id number] DOB: 09/17/1956 DOA: 06/14/2024 PCP: [name] DASEN, PA-C  246A/246A-AA  LOS: 14 days   Brief hospital course: [name] is a 68 y.o. female with medical history significant for morbid obesity with BMI of 45.73, history of atrial fibrillation status post cardioversion 05/25 on chronic anticoagulation therapy, history of DVT, chronic respiratory failure on 6 L via nasal cannula and BiPAP at night, history of hypothyroidism, rheumatoid arthritis, varicose veins involving her lower extremities, history of recurrent epistaxis who presented to the emergency room from the skilled nursing facility where she resides for evaluation of change in mental status.   She was found by the nursing home staff on the floor and hypoxic with room air pulse oximetry in the 60s.  7/10: Hemodynamically stable but remained on 10 L of oxygen.  Facility wants her to be at baseline before coming back.  Cardiology recommending restarting Xarelto  with a close monitoring.  7/11: Hemodynamically stable and able to wean back to baseline oxygen use of 6 L.  Patient need to have a functional BiPAP at her facility to avoid further hospitalizations. Facility would like to monitor for another day before taking her back.  7/12: Remained stable on 6L, facility wants to remain on 6L for at least 72 hours before coming back, they will take her back on Monday. Wound care reevaluated her, staples were not removed for concern of that it might result opening of her wound.  They advised wet-to-dry dressing.  Staples to stay in for another week.  7/13: Remained hemodynamically stable on 6L.  Hopefully can go back to her facility tomorrow.  7/14: Remained hemodynamically stable on 6 L, no facility is saying that they need to send her BiPAP back to make sure that it is working.  Seems like they are just trying to avoid her coming back as this communication is being done with them for  a long time.  Assessment & Plan:    Acute on chronic hypoxic and hypercapnic respiratory failure (HCC) COPD on 6L O2 at baseline --found with decreased responsiveness on room air pulse oximetry in the 60s --VBG showed uncompensated respiratory acidosis. --currently on 6 L HFNC. --pulm and cardio consulted --Continue supplemental O2 to keep sats >=90%, wean as tolerated --BiPAP nightly -- Apparently she was not getting proper BiPAP at her facility.   Acute on chronic heart failure with preserved ejection fraction (HFpEF) (HCC) -- LVEF of 50 to 55% from a 2D echocardiogram which was done 06/25 --has been receiving IV lasix  40 BID, but then diuretics held due to Cr increase --cardio consulted --home oral lasix  80 mg daily resumed on 7/8. -- Continuing Lasix  at 40 mg daily now as creatinine started improving and clinically appears euvolemic  COPD exacerbation --no cough, no sputum production, however, now able to wean down from 10L.  On chronic prednisone  5 mg for RA. --pulm consulted --received prednisone  40 mg daily x5, start to taper by 10 mg  --cont Brovana  nebs, pulmicort  nebs, DuoNebs, Singulair  and Yupelri   Laceration of left leg  status post suture in the ER --Left knee x-ray showed no acute findings. Left knee osteoarthritis, worst in the medial compartment. --curbsided Gen surgery-- recommends empiric antibiotic, completed 7 days of Bactrim .  Still has extensive bruising and edema around left knee, but overall slowly looking better (see photos). --dressing change daily with xerofoam; pre-medicate with IV morphine  15 min prior to dressing change. -- Keeping sutures for another  week as she was reevaluated by wound care yesterday and there is a concern that if staples were removed her wound might get opened-ADV using wet-to-dry daily dressing.   ABLA (acute blood loss anemia) history of chronic anemia --Secondary to left lower extremity laceration following a fall in the  setting of chronic anticoagulant use and varicose veins. Hemoglobin now improved to 9.6 --s/p 3u pRBC --cont iron suppl --monitor Hgb and transfuse to keep Hgb >7   Paroxysmal Atrial fibrillation (HCC) Maintaining normal sinus rhythm Amiodarone  held by cardiology in the setting of lung disease [facility] --cont cardizem  240 mg daily -- Xarelto  was initially held due to increased risk of fall, cardiology recommending restarting with a close monitoring. --Continue with Xarelto    Obesity, Class III, BMI 40-49.9 (morbid obesity) --Complicates overall prognosis and care   Rheumatoid arthritis (HCC) --Continue prednisone  and hydroxychloroquine    Hypothyroidism --Continue Synthroid   Hypokalemia Hypomag --monitor and supplement PRN    DVT prophylaxis: Lovenox  SQ Code Status: Full code  Family Communication:  Level of care: Progressive Dispo:   The patient is from: SNF Anticipated d/c is to: SNF Anticipated d/c date is: Medically stable but facility will take her back on Monday if remained on 6 L of oxygen.  Subjective and Interval History:  Patient was seen and examined today.  No new concern.  Objective: Vitals:   06/28/24 0041 06/28/24 0500 06/28/24 0835 06/28/24 1221  BP: 105/71 107/62 110/65 116/64  Pulse: (!) 59 (!) 57 (!) 59 68  Resp: 18 18 16 19   Temp: (!) 97.5 F (36.4 C) (!) 97.1 F (36.2 C) 97.6 F (36.4 C) 98.2 F (36.8 C)  TempSrc:      SpO2: 100% 100% 96% 98%  Weight:      Height:        Intake/Output Summary (Last 24 hours) at 06/28/2024 1332 Last data filed at 06/28/2024 0900 Gross per 24 hour  Intake 960 ml  Output 900 ml  Net 60 ml   Filed Weights   06/14/24 0938 06/23/24 1600  Weight: 113.4 kg 98.5 kg    Examination:   General.  Obese lady, in no acute distress. Pulmonary.  Lungs clear bilaterally, normal respiratory effort. CV.  Regular rate and rhythm, no JVD, rub or murmur. Abdomen.  Soft, nontender,  nondistended, BS positive. CNS.  Alert and oriented .  No focal neurologic deficit. Extremities.  No edema,  pulses intact and symmetrical, left leg with bandage. Psychiatry.  Judgment and insight appears normal.     Photo taken on 7/8   Data Reviewed: I have personally reviewed labs and imaging studies  Time spent: 42 minutes  This record has been created using Conservation officer, historic buildings. Errors have been sought and corrected,but may not always be located. Such creation errors do not reflect on the standard of care.   Amaryllis Dare, MD [facility] If 7PM-7AM, please contact night-coverage 06/28/2024, 1:32 PM

## 2024-06-28 NOTE — Plan of Care (Signed)

## 2024-06-29 ENCOUNTER — Encounter: Admitting: Family

## 2024-06-29 ENCOUNTER — Inpatient Hospital Stay

## 2024-06-29 DIAGNOSIS — D62 Acute posthemorrhagic anemia: Secondary | ICD-10-CM | POA: Diagnosis not present

## 2024-06-29 DIAGNOSIS — J9601 Acute respiratory failure with hypoxia: Secondary | ICD-10-CM | POA: Diagnosis not present

## 2024-06-29 DIAGNOSIS — I5033 Acute on chronic diastolic (congestive) heart failure: Secondary | ICD-10-CM | POA: Diagnosis not present

## 2024-06-29 DIAGNOSIS — S81812A Laceration without foreign body, left lower leg, initial encounter: Secondary | ICD-10-CM | POA: Diagnosis not present

## 2024-06-29 NOTE — Evaluation (Signed)
 Occupational Therapy Re-Evaluation Patient Details Name: [name] MRN: [id number] DOB: 03-23-1956 Today's Date: 06/29/2024   History of Present Illness   [name] is a 68 y.o. female with medical history significant for morbid obesity with BMI of 45.73, history of atrial fibrillation status post cardioversion 05/25 on chronic anticoagulation therapy, history of DVT, chronic respiratory failure on 6 L via nasal cannula and BiPAP at night, history of hypothyroidism, rheumatoid arthritis, varicose veins involving her lower extremities, history of recurrent epistaxis who presents to the emergency room from the skilled nursing facility where she resides for evaluation of change in mental status and fall resulting in LLE laceration.     Clinical Impressions Pt seen for OT re-evaluation this date. Pt pleasant, agreeable to session despite pain in LLE wound. Nursing in middle of wound care, stepped out prior to session. EOB/OOB deferred 2/2 wound care and pt notes recently up. She also notes she has been more consistently getting to the [facility] with staff and sitting EOB for meals. Pt set up for long sitting grooming tasks, requiring MAX A to comb hair 2/2 significant tangles/matting and pt becoming too fatigued to continue with arms above her head. VC for PLB. Pt tolerated well. Has demonstrated good progress towards goals. Goals reviewed and remain appropriate. Continues to benefit from skilled OT services.      If plan is discharge home, recommend the following:   Assist for transportation;Assistance with cooking/housework;Help with stairs or ramp for entrance;A lot of help with bathing/dressing/bathroom;A lot of help with walking and/or transfers     Functional Status Assessment   Patient has had a recent decline in their functional status and demonstrates the ability to make significant improvements in function in a reasonable and predictable amount of time.     Equipment  Recommendations   Other (comment) (defer to next venue)     Recommendations for Other Services         Precautions/Restrictions   Precautions Precautions: Fall Recall of Precautions/Restrictions: Intact Restrictions Weight Bearing Restrictions Per Provider Order: No     Mobility Bed Mobility       General bed mobility comments: deferred 2/2 ongoing wound mgt (RN stepped out briefly)    Transfers            ADL either performed or assessed with clinical judgement   ADL Overall ADL's : Needs assistance/impaired     Grooming: Sitting;Wash/dry face;Wash/dry hands;Set up;Supervision/safety;Brushing hair;Maximal assistance Grooming Details (indicate cue type and reason): long sitting in bed, set up for washing face/hands, required MAX A to complete hair brushing 2/2 significant tangles/matting and pt becoming too fatigued to continue.           Pertinent Vitals/Pain Pain Assessment Pain Assessment: Faces Pain Score: 7  Pain Location: LLE Pain Descriptors / Indicators: Discomfort, Grimacing, Guarding Pain Intervention(s): Limited activity within patient's tolerance, Monitored during session, Premedicated before session, Repositioned     Extremity/Trunk Assessment Upper Extremity Assessment Upper Extremity Assessment: Generalized weakness   Lower Extremity Assessment Lower Extremity Assessment: Generalized weakness   Cervical / Trunk Assessment Cervical / Trunk Assessment: Kyphotic   Communication Communication Communication: No apparent difficulties   Cognition Arousal: Alert   Cognition: No apparent impairments                     Home Living Family/patient expects to be discharged to:: Skilled nursing facility      Additional Comments: Patient lives at [facility] where she recent moved  to be closer to her family, resided for a few days total between admissions, prior to that she lived in a different SNF for at least 3 years       Prior Functioning/Environment Prior Level of Function : Needs assist         Mobility (physical): Bed mobility;Transfers;Gait;Stairs ADLs (physical): Grooming;Dressing;Bathing;IADLs;Toileting Mobility Comments: pt states as recently as April she used a RW for short distances and WC for longer distances, unable to self propel ADLs Comments: pt states prior to April she needed assistance with IADLs and showering but can go to the bathroom herself.    OT Problem List: Decreased activity tolerance;Decreased strength;Impaired balance (sitting and/or standing);Pain   OT Treatment/Interventions: Self-care/ADL training;Therapeutic exercise;Therapeutic activities;Energy conservation;DME and/or AE instruction;Patient/family education;Balance training      OT Goals(Current goals can be found in the care plan section)   Acute Rehab OT Goals Patient Stated Goal: improve function and breathing OT Goal Formulation: With patient Time For Goal Achievement: 07/13/24 Potential to Achieve Goals: Good   OT Frequency:  Min 2X/week       AM-PAC OT 6 Clicks Daily Activity     Outcome Measure Help from another person eating meals?: None Help from another person taking care of personal grooming?: A Little Help from another person toileting, which includes using toliet, bedpan, or urinal?: A Lot Help from another person bathing (including washing, rinsing, drying)?: A Lot Help from another person to put on and taking off regular upper body clothing?: A Little Help from another person to put on and taking off regular lower body clothing?: A Lot 6 Click Score: 16   End of Session Equipment Utilized During Treatment: Oxygen  Activity Tolerance: Patient tolerated treatment well Patient left: in bed;with call bell/phone within reach  OT Visit Diagnosis: Other abnormalities of gait and mobility (R26.89);Pain Pain - Right/Left: Left Pain - part of body: Leg                Time: 8895-8882 OT Time  Calculation (min): 13 min Charges:  OT General Charges $OT Visit: 1 Visit OT Evaluation $OT Re-eval: 1 Re-eval  Warren SAUNDERS., MPH, MS, OTR/L ascom [phone] 06/29/24, 1:51 PM

## 2024-06-29 NOTE — Progress Notes (Addendum)
 PROGRESS NOTE    [name]  FMW:[id number] DOB: [birth year] DOA: 06/14/2024 PCP: [name] DASEN, PA-C  246A/246A-AA  LOS: 15 days   Brief hospital course: [name] is a 68 y.o. female with medical history significant for morbid obesity with BMI of 45.73, history of atrial fibrillation status post cardioversion 05/25 on chronic anticoagulation therapy, history of DVT, chronic respiratory failure on 6 L via nasal cannula and BiPAP at night, history of hypothyroidism, rheumatoid arthritis, varicose veins involving her lower extremities, history of recurrent epistaxis who presented to the emergency room from the skilled nursing facility where she resides for evaluation of change in mental status.   She was found by the nursing home staff on the floor and hypoxic with room air pulse oximetry in the 60s.  7/10: Hemodynamically stable but remained on 10 L of oxygen.  Facility wants her to be at baseline before coming back.  Cardiology recommending restarting Xarelto  with a close monitoring.  7/11: Hemodynamically stable and able to wean back to baseline oxygen use of 6 L.  Patient need to have a functional BiPAP at her facility to avoid further hospitalizations. Facility would like to monitor for another day before taking her back.  7/12: Remained stable on 6L, facility wants to remain on 6L for at least 72 hours before coming back, they will take her back on Monday. Wound care reevaluated her, staples were not removed for concern of that it might result opening of her wound.  They advised wet-to-dry dressing.  Staples to stay in for another week.  7/13: Remained hemodynamically stable on 6L.  Hopefully can go back to her facility tomorrow.  7/14: Remained hemodynamically stable on 6 L, no facility is saying that they need to send her BiPAP back to make sure that it is working.  Seems like they are just trying to avoid her coming back as this communication is being done with them for  a long time.  7/15: Remained stable on 6 L.  SNF has not arranged a functioning BiPAP yet.  Worsening pain and edema of injured leg so wound care was reconsulted. Ultrasound was also ordered to rule out any fluid collection.  Assessment & Plan:    Acute on chronic hypoxic and hypercapnic respiratory failure (HCC) COPD on 6L O2 at baseline --found with decreased responsiveness on room air pulse oximetry in the 60s --VBG showed uncompensated respiratory acidosis. --currently on 6 L HFNC. --pulm and cardio consulted --Continue supplemental O2 to keep sats >=90%, wean as tolerated --BiPAP nightly -- Apparently she was not getting proper BiPAP at her facility.   Acute on chronic heart failure with preserved ejection fraction (HFpEF) (HCC) -- LVEF of 50 to 55% from a 2D echocardiogram which was done 06/25 --has been receiving IV lasix  40 BID, but then diuretics held due to Cr increase --cardio consulted --home oral lasix  80 mg daily resumed on 7/8 and then dose was decreased to 40 due to worsening renal function -- Continuing Lasix  at 40 mg daily now as creatinine started improving and clinically appears euvolemic  COPD exacerbation --no cough, no sputum production, however, now able to wean down from 10L.  On chronic prednisone  5 mg for RA. --pulm consulted --received prednisone  40 mg daily x5, start to taper by 10 mg  --cont Brovana  nebs, pulmicort  nebs, DuoNebs, Singulair  and Yupelri   Laceration of left leg  status post suture in the ER --Left knee x-ray showed no acute findings. Left knee osteoarthritis, worst in  the medial compartment. --curbsided Gen surgery-- recommends empiric antibiotic, completed 7 days of Bactrim .  Still has extensive bruising and edema around left knee, but overall slowly looking better (see photos). --dressing change daily with xerofoam; pre-medicate with IV morphine  15 min prior to dressing change. -- Keeping sutures for another week as she was reevaluated  by wound care yesterday and there is a concern that if staples were removed her wound might get opened-ADV using wet-to-dry daily dressing. --Wound care was reconsulted today due to worsening pain and edema   ABLA (acute blood loss anemia) history of chronic anemia --Secondary to left lower extremity laceration following a fall in the setting of chronic anticoagulant use and varicose veins. Hemoglobin now at 9.1 --s/p 3u pRBC --cont iron suppl --monitor Hgb and transfuse to keep Hgb >7   Paroxysmal Atrial fibrillation (HCC) Maintaining normal sinus rhythm Amiodarone  held by cardiology in the setting of lung disease Prior [facility] at [facility] --cont cardizem  240 mg daily -- Xarelto  was initially held due to increased risk of fall, cardiology recommending restarting with a close monitoring. --Continue with Xarelto    Obesity, Class III, BMI 40-49.9 (morbid obesity) --Complicates overall prognosis and care   Rheumatoid arthritis (HCC) --Continue prednisone  and hydroxychloroquine    Hypothyroidism --Continue Synthroid   Hypokalemia Hypomag --monitor and supplement PRN    DVT prophylaxis: Lovenox  SQ Code Status: Full code  Family Communication:  Level of care: Progressive Dispo:   The patient is from: SNF Anticipated d/c is to: SNF Anticipated d/c date is: Medically stable but facility still has not arranged a functional BiPAP  Subjective and Interval History:  Patient was eating breakfast when seen today.  Slight worsening of pain at injured leg.  Objective: Vitals:   06/29/24 0505 06/29/24 0748 06/29/24 0858 06/29/24 1208  BP: (!) 113/58  103/65 105/60  Pulse:   73 66  Resp:   20 19  Temp:   98.2 F (36.8 C) 98.1 F (36.7 C)  TempSrc:      SpO2:  96% 96% 99%  Weight:      Height:        Intake/Output Summary (Last 24 hours) at 06/29/2024 1332 Last data filed at 06/29/2024 1208 Gross per 24 hour  Intake 1440 ml  Output 3200 ml  Net -1760 ml    Filed Weights   06/14/24 0938 06/23/24 1600  Weight: 113.4 kg 98.5 kg    Examination:   General.  Obese lady, in no acute distress. Pulmonary.  Lungs clear bilaterally, normal respiratory effort. CV.  Regular rate and rhythm, no JVD, rub or murmur. Abdomen.  Soft, nontender, nondistended, BS positive. CNS.  Alert and oriented .  No focal neurologic deficit. Extremities.  Left leg with clean bandage Psychiatry.  Judgment and insight appears normal.     Photo taken on 7/8   Data Reviewed: I have personally reviewed labs and imaging studies  Time spent: 41 minutes  This record has been created using Conservation officer, historic buildings. Errors have been sought and corrected,but may not always be located. Such creation errors do not reflect on the standard of care.   Amaryllis Dare, MD [facility] If 7PM-7AM, please contact night-coverage 06/29/2024, 1:32 PM

## 2024-06-29 NOTE — Plan of Care (Signed)

## 2024-06-29 NOTE — Progress Notes (Addendum)
 Physical Therapy Treatment Patient Details Name: [name] MRN: [id number] DOB: 16-Oct-1956 Today's Date: 06/29/2024   History of Present Illness [name] is a 68 y.o. female with medical history significant for morbid obesity with BMI of 45.73, history of atrial fibrillation status post cardioversion 05/25 on chronic anticoagulation therapy, history of DVT, chronic respiratory failure on 6 L via nasal cannula and BiPAP at night, history of hypothyroidism, rheumatoid arthritis, varicose veins involving her lower extremities, history of recurrent epistaxis who presents to the emergency room from the skilled nursing facility where she resides for evaluation of change in mental status and fall resulting in LLE laceration.    PT Comments  Pt in bed this am.  Stated she recently sat EOB with RN and did well getting in/out of bed today and was able to laterally scoot up in bed just prior to session.  Stated it felt easier today.  RN in room recently removed dressing on LLE and cleansed.  She is in inc pain at this time with pain meds given and has reached out to wound care team.  Supine ex for RLE and isometrics were completed and encourage ind HEP.  OOB mobility deferred at this time while addressed.     If plan is discharge home, recommend the following: Assistance with cooking/housework;Assist for transportation;Help with stairs or ramp for entrance;Two people to help with walking and/or transfers;A little help with bathing/dressing/bathroom   Can travel by private vehicle        Equipment Recommendations  None recommended by PT    Recommendations for Other Services       Precautions / Restrictions Precautions Precautions: Fall Recall of Precautions/Restrictions: Intact Restrictions Weight Bearing Restrictions Per Provider Order: No     Mobility  Bed Mobility                    Transfers                        Ambulation/Gait                    Stairs             Wheelchair Mobility     Tilt Bed    Modified Rankin (Stroke Patients Only)       Balance                                            Communication Communication Communication: No apparent difficulties  Cognition Arousal: Alert Behavior During Therapy: WFL for tasks assessed/performed, Anxious   PT - Cognitive impairments: No apparent impairments                         Following commands: Intact      Cueing Cueing Techniques: Verbal cues, Tactile cues, Visual cues  Exercises Other Exercises Other Exercises: RLE AROM and BLE isometrics    General Comments        Pertinent Vitals/Pain Pain Assessment Pain Assessment: Faces Faces Pain Scale: Hurts even more Pain Location: LLE Pain Descriptors / Indicators: Discomfort, Grimacing, Guarding Pain Intervention(s): RN gave pain meds during session    Home Living                          Prior  Function            PT Goals (current goals can now be found in the care plan section) Progress towards PT goals: Progressing toward goals    Frequency    Min 2X/week      PT Plan      Co-evaluation              AM-PAC PT 6 Clicks Mobility   Outcome Measure  Help needed turning from your back to your side while in a flat bed without using bedrails?: A Little Help needed moving from lying on your back to sitting on the side of a flat bed without using bedrails?: A Little Help needed moving to and from a bed to a chair (including a wheelchair)?: A Little Help needed standing up from a chair using your arms (e.g., wheelchair or bedside chair)?: A Little Help needed to walk in hospital room?: A Lot Help needed climbing 3-5 steps with a railing? : Total 6 Click Score: 15    End of Session Equipment Utilized During Treatment: Oxygen Activity Tolerance: Patient limited by pain;Other (comment) Patient left: in bed;with call bell/phone  within reach;with bed alarm set Nurse Communication: Mobility status PT Visit Diagnosis: Repeated falls (R29.6);Muscle weakness (generalized) (M62.81);History of falling (Z91.81);Unsteadiness on feet (R26.81);Difficulty in walking, not elsewhere classified (R26.2);Other abnormalities of gait and mobility (R26.89)     Time: 8947-8895 PT Time Calculation (min) (ACUTE ONLY): 12 min  Charges:    $Therapeutic Exercise: 8-22 mins PT General Charges $$ ACUTE PT VISIT: 1 Visit                   Lauraine Gills, PTA 06/29/24, 1:15 PM

## 2024-06-29 NOTE — TOC Progression Note (Addendum)
 Transition of Care [facility]) - Progression Note    Patient Details  Name: [name] MRN: [id number] Date of Birth: 04-09-1956  Transition of Care [facility]) CM/SW Contact  Tomasa JAYSON Childes, RN Phone Number: 06/29/2024, 9:53 AM  Clinical Narrative:    Spoke with Tammy from [facility]. She is checking to see if patient's BIPAP has been checked.   Darrian at [facility] confirmed that there is a onsite RT.  Bed search for [facility] sent.   10:43am Spoke with patient's daughter in law Delon to advised STR would be an option at [facility] and that there is a RT on site at the facility. She was advised there are no LTC medicaid bed available. Delon was also advised at attempts to contact [facility]. RNCM updated Delon that  patient 's BIPAP was still being checked on at the facility. Delon is agreeable to have patient return to [facility].   12:47pm Per Nurse, Zada. Patient Daughter-in-law is requesting [facility].   2:15pm Spoke with Darrian from [facility]. Patient not offered a bed.   2:30pm  Spoke with Tammy at [facility]. She stated they are able to take patient back today. Per Tammy her BIPAP will be checked with the device on once patient is back  2:50pm Spoke with Delon. She is requesting [facility]. She was advised patient was not offer a bed per Darrian the [facility]. Delon would like referrals sent to [facility] and [facility]. Star rating reviewed from Medicare.gov for all [facility] facilities. Delon confirmed she only wanted [facility] and [facility] in the extended search. Referrals sent for [facility] and [facility]. She stated patient does not want to return to [facility] and wanted to stay in the hospital until she finds a facility of her choice. Medicare guidelines regarding discharge order reviewed as well as right to appeal and appeal process.      Expected Discharge Plan: Skilled Nursing Facility Barriers to Discharge: Continued Medical Work  up  Expected Discharge Plan and Services     Post Acute Care Choice: Resumption of Svcs/PTA Provider Living arrangements for the past 2 months: Skilled Nursing Facility                                       Social Determinants of Health (SDOH) Interventions SDOH Screenings   Food Insecurity: No Food Insecurity (06/14/2024)  Housing: Low Risk  (06/14/2024)  Transportation Needs: No Transportation Needs (06/14/2024)  Utilities: Not At Risk (06/14/2024)  Financial Resource Strain: High Risk (05/24/2024)  Social Connections: Socially Isolated (06/14/2024)  Tobacco Use: Medium Risk (06/14/2024)    Readmission Risk Interventions     No data to display

## 2024-06-29 NOTE — Consult Note (Signed)
 WOC contacted by bedside nursing, WOC nursing assessment 06/25/24 for leg wound with sutures.  Patient sustained a fall,repaired in the ED.  Reviewed images, wound remains unchanged however patient reports increasing pain and nursing reports increasing edema  Bullous area has drained or reabsorbed.   No changes in topical care, evaluated use of compression   Suggested US  to rule out any other non observable issues that may contribute to increased pain and edema  Dr. Caleen has ordered ultrasound.    Re consult if needed, will not follow at this time. Thanks  Sharlyne Koeneman [employer], RN,CWOCN, CNS, [name] [phone]   Addendum 7/16 0830 US  results; Complex fluid collection in the subcutaneous soft tissues, likely liquefied hematoma.  Recommended orthopedic evaluation if evaucuation needed.   [name] [facility], CNS, CWON-AP [phone]

## 2024-06-30 DIAGNOSIS — S81812A Laceration without foreign body, left lower leg, initial encounter: Secondary | ICD-10-CM | POA: Diagnosis not present

## 2024-06-30 DIAGNOSIS — J9601 Acute respiratory failure with hypoxia: Secondary | ICD-10-CM | POA: Diagnosis not present

## 2024-06-30 DIAGNOSIS — D62 Acute posthemorrhagic anemia: Secondary | ICD-10-CM | POA: Diagnosis not present

## 2024-06-30 DIAGNOSIS — S8012XA Contusion of left lower leg, initial encounter: Secondary | ICD-10-CM | POA: Diagnosis not present

## 2024-06-30 DIAGNOSIS — I5033 Acute on chronic diastolic (congestive) heart failure: Secondary | ICD-10-CM | POA: Diagnosis not present

## 2024-06-30 LAB — HEMOGLOBIN: Hemoglobin: 9.4 g/dL — ABNORMAL LOW (ref 12.0–15.0)

## 2024-06-30 NOTE — Plan of Care (Signed)

## 2024-06-30 NOTE — Progress Notes (Signed)
   06/30/24 1345  Spiritual Encounters  Type of Visit Initial  Care provided to: Patient  Referral source Chaplain assessment  Reason for visit Routine spiritual support  OnCall Visit Yes  Spiritual Framework  Presenting Themes Goals in life/care;Impactful experiences and emotions  Interventions  Spiritual Care Interventions Made Established relationship of care and support;Compassionate presence;Reflective listening;Encouragement  Intervention Outcomes  Outcomes Connection to spiritual care;Awareness around self/spiritual resourses;Connection to values and goals of care;Awareness of health;Awareness of support

## 2024-06-30 NOTE — Discharge Instructions (Signed)
 Please apply Xeroform to laceration site, and abrasion site, cover with ABD pads, wrap from midfoot to proximal shin with Kerlix to secure dressing.  Apply Ace wraps 4 inch wide, in same manner from midfoot to proximal leg, below knee.  Changes daily, please cleanse the area with soap and water between dressing changes.  Keep leg elevated above heart at all times while nonambulant.

## 2024-06-30 NOTE — Consult Note (Addendum)
 [facility] SURGICAL CONSULTATION NOTE (initial) - cpt: 00746   HISTORY OF PRESENT ILLNESS (HPI):  68 y.o. female presented to [facility] having sustained a fall that developed a left shin laceration with significant contusion.  Laceration was repaired in the ED and the patient was admitted.  She is now complaining of pain and swelling under the site of the injury.  She had recently had an ace wrap applied to the area.  An ultrasound was obtained yesterday showing hematoma at the anterior left leg.  Patient's primary complaints are pain due to the swelling.  There has been some drainage from the incision, current medical management of wound care is Xeroform and wrapping with ABD pad and Ace wrap.  Surgery is consulted by hospitalist physician Dr. Caleen in this context for evaluation and management of traumatic hematoma left shin.  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    HTN (hypertension)    Hypercholesteremia    Hypothyroid    Rheumatoid arthritis(714.0)    Varicose vein of leg      PAST SURGICAL HISTORY (PSH):  Past Surgical History:  Procedure Laterality Date   APPENDECTOMY     CHOLECYSTECTOMY     ELBOW SURGERY     LEFT   ENDOSCOPIC VEIN LASER TREATMENT     RIGHT LEG   EXPLORATORY LAPAROTOMY     HAND SURGERY     RIGHT    HEEL SPUR SURGERY     BILAT   INSERTION OF MESH N/A 08/04/2017   Procedure: INSERTION OF MESH;  Surgeon: Eletha Boas, MD;  Location: [facility] OR;  Service: General;  Laterality: N/A;   partial thyroidectomy  2015   TONSILLECTOMY     TUBAL LIGATION     VENTRAL HERNIA REPAIR N/A 08/04/2017   Procedure: LAPAROSCOPIC VENTRAL INCISIONAL HERNIA REPAIR WITH MESH;  Surgeon: Eletha Boas, MD;  Location: [facility] OR;  Service: General;  Laterality: N/A;      MEDICATIONS:  Prior to Admission medications   Medication Sig Start Date End Date Taking? Authorizing Provider  acetaminophen  (TYLENOL ) 500 MG tablet Take 500 mg by mouth every 8 (eight) hours as needed for mild pain (pain score 1-3).   Yes [provider]  amiodarone  (PACERONE ) 200 MG tablet Take 200 mg by mouth daily.   Yes [provider]  atorvastatin  (LIPITOR) 10 MG tablet Take 10 mg by mouth at bedtime.   Yes [provider]  benzonatate (TESSALON) 100 MG capsule Take 100 mg by mouth every 8 (eight) hours as needed for cough.   Yes [provider]  budesonide  (PULMICORT ) 0.5 MG/2ML nebulizer solution Take 0.5 mg by nebulization 2 (two) times daily.   Yes [provider]  Cyanocobalamin  1000 MCG TBCR Take 1 tablet by mouth daily.   Yes [provider]  diltiazem  (CARDIZEM  CD) 240 MG 24 hr capsule Take 1 capsule (240 mg total) by mouth daily. 06/08/24  Yes Wouk, Devaughn Sayres, MD  ferrous sulfate  325 (65 FE) MG EC tablet Take 325 mg by mouth daily with breakfast.   Yes [provider]  folic acid  (FOLVITE ) 1 MG tablet Take 1 mg by mouth daily.   Yes [provider]  furosemide  (LASIX ) 80  MG tablet Take 80 mg by mouth daily.   Yes [provider]  gabapentin  (NEURONTIN ) 400 MG capsule Take 400 mg by mouth 3 (three) times daily.   Yes [provider]  hydroxychloroquine  (PLAQUENIL ) 200 MG tablet Take 200 mg by mouth daily.   Yes [provider]  insulin  lispro (HUMALOG) 100 UNIT/ML injection Inject 2-10 Units into the skin 3 (three) times daily before meals. Sliding scale   Yes [provider]  ipratropium-albuterol  (DUONEB) 0.5-2.5 (3) MG/3ML SOLN Take 3 mLs by nebulization every 6 (six) hours.   Yes [provider]  levothyroxine  (SYNTHROID ) 150 MCG tablet Take 150 mcg by mouth daily before breakfast.   Yes [provider]  melatonin 5 MG TABS Take 5 mg by mouth at  bedtime.   Yes [provider]  montelukast  (SINGULAIR ) 10 MG tablet Take 10 mg by mouth daily.   Yes [provider]  Multiple Vitamin (MULTIVITAMIN) tablet Take 1 tablet by mouth daily.   Yes [provider]  naloxone (NARCAN) nasal spray 4 mg/0.1 mL Place 1 spray into the nose 3 (three) times daily as needed (symptoms of opiod overdose).   Yes [provider]  oxymetazoline  (AFRIN) 0.05 % nasal spray Place 1 spray into both nostrils 2 (two) times daily. 05/24/24  Yes Wouk, Devaughn Sayres, MD  pantoprazole  (PROTONIX ) 40 MG tablet Take 40 mg by mouth daily.   Yes [provider]  predniSONE  (DELTASONE ) 5 MG tablet Take 5 mg by mouth daily with breakfast.   Yes [provider]  rivaroxaban  (XARELTO ) 20 MG TABS tablet Take 20 mg by mouth daily.   Yes [provider]  sennosides-docusate sodium  (SENOKOT-S) 8.6-50 MG tablet Take 2 tablets by mouth 2 (two) times daily.   Yes [provider]  sertraline  (ZOLOFT ) 25 MG tablet Take 25 mg by mouth daily. Takes with 50mg  tab = 75mg  daily   Yes [provider]  sertraline  (ZOLOFT ) 50 MG tablet Take 50 mg by mouth daily. Takes with 25mg  tab = 75mg  daily   Yes [provider]  traMADol  HCl 25 MG TABS Take by mouth. PRN   Yes [provider]     ALLERGIES:  Allergies  Allergen Reactions   Latex Hives, Itching and Dermatitis    Blisters (also)  Skin blisters   Oxycodone -Acetaminophen  Nausea Only and Other (See Comments)    Violent Vomiting Violent Vomiting   Penicillins Anaphylaxis, Swelling and Rash    Has patient had a PCN reaction causing immediate rash, facial/tongue/throat swelling, SOB or lightheadedness with hypotension: Yes Has patient had a PCN reaction causing severe rash involving mucus membranes or skin necrosis: No Has patient had a PCN reaction that required hospitalization No Has patient had a PCN reaction occurring within the last 10 years:  No If all of the above answers are NO, then may proceed with Cephalosporin use.    Strawberry Extract Hives, Itching, Rash, Anaphylaxis, Dermatitis and Swelling   Oxycodone -Acetaminophen  Nausea And Vomiting   Tyloxapol Nausea And Vomiting   Adhesive [Tape] Rash    Patient prefers paper tape   Wound Dressing Adhesive Dermatitis     SOCIAL HISTORY:  Social History   Socioeconomic History   Marital status: Divorced    Spouse name: Not on file   Number of children: 1   Years of education: Not on file   Highest education level: Some college, no degree  Occupational History   Occupation: Nutritional therapist:  [employer]   Occupation: Retired  Tobacco Use   Smoking status: Former    Current packs/day: 0.00    Average packs/day: 1 pack/day for 40.0 years (40.0 ttl pk-yrs)    Types: Cigarettes    Start date: 50    Quit date: 2021    Years since quitting: 4.5   Smokeless tobacco: Never   Tobacco comments:    < 1/2 pack/day  Vaping Use   Vaping status: Never Used  Substance and Sexual Activity   Alcohol use: No   Drug use: No   Sexual activity: Never  Other Topics Concern   Not on file  Social History Narrative   Not on file   Social Drivers of Health   Financial Resource Strain: High Risk (05/24/2024)   Overall Financial Resource Strain (CARDIA)    Difficulty of Paying Living Expenses: Very hard  Food Insecurity: No Food Insecurity (06/14/2024)   Hunger Vital Sign    Worried About Running Out of Food in the Last Year: Never true    Ran Out of Food in the Last Year: Never true  Transportation Needs: No Transportation Needs (06/14/2024)   PRAPARE - Administrator, Civil Service (Medical): No    Lack of Transportation (Non-Medical): No  Physical Activity: Not on file  Stress: Not on file  Social Connections: Socially Isolated (06/14/2024)   Social Connection and Isolation Panel    Frequency of Communication with Friends and Family: More than three  times a week    Frequency of Social Gatherings with Friends and Family: More than three times a week    Attends Religious Services: Never    Database administrator or Organizations: No    Attends Banker Meetings: Never    Marital Status: Divorced  Catering manager Violence: Not At Risk (06/14/2024)   Humiliation, Afraid, Rape, and Kick questionnaire    Fear of Current or Ex-Partner: No    Emotionally Abused: No    Physically Abused: No    Sexually Abused: No     FAMILY HISTORY:  Family History  Problem Relation Age of Onset   Diabetes Mother    Kidney disease Mother    Lung cancer Father    Alzheimer's disease Maternal Grandmother    Stroke Maternal Grandfather    Heart disease Paternal Grandmother    Unexplained death Paternal Grandfather       REVIEW OF SYSTEMS:  ROS  VITAL SIGNS:  Temp:  [97.9 F (36.6 C)-99.1 F (37.3 C)] 97.9 F (36.6 C) (07/16 1209) Pulse Rate:  [61-80] 65 (07/16 1209) Resp:  [14-19] 19 (07/16 0428) BP: (106-116)/(58-80) 107/62 (07/16 1209) SpO2:  [97 %-100 %] 99 % (07/16 1209) FiO2 (%):  [35 %-50 %] 35 % (07/16 1417)     Height: 5' 2 (157.5 cm) Weight: 98.5 kg BMI (Calculated): 39.71   INTAKE/OUTPUT:  07/15 0701 - 07/16 0700 In: 1200 [P.O.:1200] Out: 1350 [Urine:1350]  PHYSICAL EXAM:  Physical Exam Blood pressure 107/62, pulse 65, temperature 97.9 F (36.6 C), resp. rate 19, height 5' 2 (1.575 m), weight 98.5 kg, SpO2 99%. Last Weight  Most recent update: 06/23/2024  4:38 PM    Weight  98.5 kg (217 lb 2.5 oz)             CONSTITUTIONAL: Well developed, and nourished, appropriately responsive and aware without distress.  Frail obese appearing elderly female. EYES: Sclera non-icteric.   EARS, NOSE, MOUTH AND THROAT:    Hearing is  intact to voice.  RESPIRATORY:   Normal respiratory effort without pathologic use of accessory muscles. CARDIOVASCULAR:  Well perfused.  GI: The abdomen is  soft, nontender, and nondistended.   MUSCULOSKELETAL: There is a transverse laceration over the proximal left anterior leg with interrupted nylon sutures present.  There is some evidence of weeping serosanguinous drainage from the lateralmost aspect.  There is no erythema, or induration.  The inferiorly raised area consistent with the known hematoma is tender, but the overlying skin is without induration or erythema.  There is no fluctuance.  And the skin appears to be quite intact though not shiny and not tense.  When I came into the room she was sitting upright with her legs dangling from the bed.  She had an Ace wrap securing her dressing mid leg.  SKIN: Skin turgor is normal. No pathologic skin lesions appreciated.  NEUROLOGIC:  Motor and sensation appear grossly normal.  Cranial nerves are grossly without defect. PSYCH:  Alert and oriented to person, place and time. Affect is appropriate for situation.  Data Reviewed I have personally reviewed what is currently available of the patient's imaging, recent labs and medical records.    Labs:     Latest Ref Rng & Units 06/30/2024    5:31 AM 06/28/2024    6:24 AM 06/25/2024    5:10 AM  CBC  WBC 4.0 - 10.5 K/uL  9.6    Hemoglobin 12.0 - 15.0 g/dL 9.4  9.1  9.6   Hematocrit 36.0 - 46.0 %  30.0    Platelets 150 - 400 K/uL  271        Latest Ref Rng & Units 06/28/2024    6:24 AM 06/27/2024    3:36 AM 06/26/2024    6:01 AM  CMP  Glucose 70 - 99 mg/dL 97  85  88   BUN 8 - 23 mg/dL 50  47  50   Creatinine 0.44 - 1.00 mg/dL 8.83  9.09  8.98   Sodium 135 - 145 mmol/L 135  134  135   Potassium 3.5 - 5.1 mmol/L 3.1  3.6  3.7   Chloride 98 - 111 mmol/L 90  87  86   CO2 22 - 32 mmol/L 35  36  40   Calcium  8.9 - 10.3 mg/dL 8.8  9.2  9.7      Imaging studies:  Radiology review: I reviewed the report and the images from the ultrasound obtained of this leg yesterday. Last 24 hrs: No results found.   Assessment/Plan:  68 y.o. female with trauma to the left leg and shin area just  below the knee.  Some wound drainage but no evidence of infection.  Significant hematoma though not clearly benefiting from incision and drainage or evacuation., complicated by pertinent comorbidities including:  Patient Active Problem List   Diagnosis Date Noted   Syncope 06/24/2024   Anemia 06/23/2024   Acute hypoxic on chronic hypercapnic respiratory failure (HCC) 06/14/2024   ABLA (acute blood loss anemia) 06/14/2024   Laceration of left lower leg 06/14/2024   Fall 06/14/2024   Hypercapnic respiratory failure (HCC) 05/31/2024   Acute hypoxic respiratory failure (HCC) 05/30/2024   History of pulmonary embolism 05/21/2024   Obesity, Class III, BMI 40-49.9 (morbid obesity) 05/21/2024   Atrial fibrillation (HCC) 05/21/2024   Chronic anticoagulation 05/21/2024   Acute on chronic respiratory failure with hypoxia (HCC) 05/21/2024   Epistaxis 05/21/2024   History of DVT (deep vein thrombosis) 05/21/2024   CHF exacerbation (  HCC) 05/21/2024   Acute on chronic heart failure with preserved ejection fraction (HFpEF) (HCC) 05/20/2024   Type 2 diabetes mellitus without complication, without long-term current use of insulin  (HCC) 05/28/2023   OSA on CPAP 05/29/2022   COVID-19 01/05/2021   Hypoxia    Chronic obstructive pulmonary disease (HCC)    Mitral valve annular calcification 04/07/2018   Depression 04/07/2018   Diastolic heart failure (HCC) 02/10/2018   Family history of hemophilia B 12/02/2017   Healthcare maintenance 05/07/2017   Onychomycosis 05/07/2017   Tricompartment osteoarthritis of both knees 02/19/2017   Hyperlipidemia 11/14/2016   AKI (acute kidney injury) (HCC) 04/23/2015   Rheumatoid arthritis (HCC) 07/08/2008   Morbid obesity (HCC) 05/04/2008   Essential hypertension 05/04/2008   ALLERGIC RHINITIS 05/04/2008   GERD 05/04/2008   VARICOSE VEINS LOWER EXTREMITIES W/INFLAMMATION 11/02/2007   Hypothyroidism 08/18/2007    - I removed the sutures over the moist and slightly  draining area of the laceration.  Hopefully this would allow more passive drainage to occur from the site.  I reapplied Xeroform to the abraded area and this portion of the incision.  I then reapplied ABD pads and secured them with Curlex from midfoot to proximal leg.  - Then encouraged her that she should always keep this leg elevated when she is not ambulating which is almost all the time.  - Understand she is being discharged back to her skilled nursing facility and placed discharge instructions outlining the above, I will be happy to see her as an outpatient in a week.    All of the above findings and recommendations were discussed with the patient and all of patient's questions were answered to their expressed satisfaction.  I personally spent a total of  60 minutes in the care of the patient today including preparing to see the patient, getting/reviewing separately obtained history, performing a medically appropriate exam/evaluation, counseling and educating, placing orders, referring and communicating with other health care professionals, and documenting clinical information in the EHR.    Thank you for the opportunity to participate in this patient's care.   -- Honor Leghorn, M.D., FACS 06/30/2024, 4:13 PM

## 2024-06-30 NOTE — Progress Notes (Signed)
 PROGRESS NOTE    [name]  FMW:[id number] DOB: [birth year] DOA: 06/14/2024 PCP: [name] DASEN, PA-C  [id number]/[id number]-AA  LOS: 16 days   Brief hospital course: [name] is a 68 y.o. female with medical history significant for morbid obesity with BMI of 45.73, history of atrial fibrillation status post cardioversion 05/25 on chronic anticoagulation therapy, history of DVT, chronic respiratory failure on 6 L via nasal cannula and BiPAP at night, history of hypothyroidism, rheumatoid arthritis, varicose veins involving her lower extremities, history of recurrent epistaxis who presented to the emergency room from the skilled nursing facility where she resides for evaluation of change in mental status.   She was found by the nursing home staff on the floor and hypoxic with room air pulse oximetry in the 60s.  7/10: Hemodynamically stable but remained on 10 L of oxygen.  Facility wants her to be at baseline before coming back.  Cardiology recommending restarting Xarelto  with a close monitoring.  7/11: Hemodynamically stable and able to wean back to baseline oxygen use of 6 L.  Patient need to have a functional BiPAP at her facility to avoid further hospitalizations. Facility would like to monitor for another day before taking her back.  7/12: Remained stable on 6L, facility wants to remain on 6L for at least 72 hours before coming back, they will take her back on Monday. Wound care reevaluated her, staples were not removed for concern of that it might result opening of her wound.  They advised wet-to-dry dressing.  Staples to stay in for another week.  7/13: Remained hemodynamically stable on 6L.  Hopefully can go back to her facility tomorrow.  7/14: Remained hemodynamically stable on 6 L, no facility is saying that they need to send her BiPAP back to make sure that it is working.  Seems like they are just trying to avoid her coming back as this communication is being done with them for  a long time.  7/15: Remained stable on 6 L.  SNF has not arranged a functioning BiPAP yet.  Worsening pain and edema of injured leg so wound care was reconsulted. Ultrasound was also ordered to rule out any fluid collection.  7/16: Hemodynamically stable on 6 L.  Left lower extremity soft tissue ultrasound with liquefying hematoma-General Surgery was consulted and they opened a few sutures for drainage and place instructions for bandage.  Still awaiting facility to take her back.  Assessment & Plan:    Acute on chronic hypoxic and hypercapnic respiratory failure (HCC) COPD on 6L O2 at baseline --found with decreased responsiveness on room air pulse oximetry in the 60s --VBG showed uncompensated respiratory acidosis. --currently on 6 L HFNC. --pulm and cardio consulted --Continue supplemental O2 to keep sats >=90%, wean as tolerated --BiPAP nightly -- Apparently she was not getting proper BiPAP at her facility.   Acute on chronic heart failure with preserved ejection fraction (HFpEF) (HCC) -- LVEF of 50 to 55% from a 2D echocardiogram which was done 06/25 --has been receiving IV lasix  40 BID, but then diuretics held due to Cr increase --cardio consulted --home oral lasix  80 mg daily resumed on 7/8 and then dose was decreased to 40 due to worsening renal function -- Continuing Lasix  at 40 mg daily now as creatinine started improving and clinically appears euvolemic  COPD exacerbation --no cough, no sputum production, however, now able to wean down from 10L.  On chronic prednisone  5 mg for RA. --pulm consulted --received prednisone  40 mg daily  x5, start to taper by 10 mg  --cont Brovana  nebs, pulmicort  nebs, DuoNebs, Singulair  and Yupelri   Laceration of left leg  status post suture in the ER --Left knee x-ray showed no acute findings. Left knee osteoarthritis, worst in the medial compartment. --curbsided Gen surgery-- recommends empiric antibiotic, completed 7 days of Bactrim .  Still  has extensive bruising and edema around left knee, but overall slowly looking better (see photos). --dressing change daily with xerofoam; pre-medicate with IV morphine  15 min prior to dressing change. -- Ultrasound yesterday with some concern over liquefying hematoma. -General Surgery opened a few sutures for drainage and placed recommendations for continuation of wound care and bandage.  No concern of infection.  ABLA (acute blood loss anemia) history of chronic anemia --Secondary to left lower extremity laceration following a fall in the setting of chronic anticoagulant use and varicose veins. Hemoglobin now at 9.4 --s/p 3u pRBC --cont iron suppl --monitor Hgb and transfuse to keep Hgb >7   Paroxysmal Atrial fibrillation (HCC) Maintaining normal sinus rhythm Amiodarone  held by cardiology in the setting of lung disease Prior [facility] --cont cardizem  240 mg daily -- Xarelto  was initially held due to increased risk of fall, cardiology recommending restarting with a close monitoring. --Continue with Xarelto    Obesity, Class III, BMI 40-49.9 (morbid obesity) --Complicates overall prognosis and care   Rheumatoid arthritis (HCC) --Continue prednisone  and hydroxychloroquine    Hypothyroidism --Continue Synthroid   Hypokalemia Hypomag --monitor and supplement PRN    DVT prophylaxis: Lovenox  SQ Code Status: Full code  Family Communication:  Level of care: Progressive Dispo:   The patient is from: SNF Anticipated d/c is to: SNF Anticipated d/c date is: Medically stable but facility still has not arranged a functional BiPAP  Subjective and Interval History:  Patient was seen and examined today.  Left leg pain little better today.  No other concern.  Objective: Vitals:   06/30/24 0428 06/30/24 0843 06/30/24 1015 06/30/24 1209  BP: (!) 106/59 (!) 107/58 (!) 107/58 107/62  Pulse: 61 63  65  Resp: 19     Temp: 97.9 F (36.6 C) 97.9 F (36.6 C)  97.9 F  (36.6 C)  TempSrc:  Oral    SpO2: 100% 98%  99%  Weight:      Height:        Intake/Output Summary (Last 24 hours) at 06/30/2024 1540 Last data filed at 06/30/2024 1438 Gross per 24 hour  Intake 1200 ml  Output 1700 ml  Net -500 ml   Filed Weights   06/14/24 0938 06/23/24 1600  Weight: 113.4 kg 98.5 kg    Examination:   General.     In no acute distress. Pulmonary.  Lungs clear bilaterally, normal respiratory effort. CV.  Regular rate and rhythm, no JVD, rub or murmur. Abdomen.  Soft, nontender, nondistended, BS positive. CNS.  Alert and oriented .  No focal neurologic deficit. Extremities.  Left lower extremity with edema and a clean bandage. Psychiatry.  Judgment and insight appears normal.       Data Reviewed: I have personally reviewed labs and imaging studies  Time spent: 43 minutes  This record has been created using Conservation officer, historic buildings. Errors have been sought and corrected,but may not always be located. Such creation errors do not reflect on the standard of care.   Amaryllis Dare, MD [facility] If 7PM-7AM, please contact night-coverage 06/30/2024, 3:40 PM

## 2024-06-30 NOTE — TOC Progression Note (Addendum)
 Transition of Care [facility]) - Progression Note    Patient Details  Name: [name] MRN: [id number] Date of Birth: [birth year]  [facility]) CM/SW Contact  Tomasa JAYSON Childes, RN Phone Number: 06/30/2024, 10:28 AM  Clinical Narrative:    Spoke with Tammy from [facility]. Patient can return to the facility today. [facility] has a vendor prepared to check patient's BIPAP once she returns.   10:20am Spoke with Alfonso at [facility]. Patient referral declined. They do not take new BIPAP patient's.   10:25am Spoke with Therisa from [facility], no LTC beds available. Rt not onsite daily.   1:50pm Spoke with Delon, to advised patient does not have any other bed offers. She was advised patient bed offer at [facility] is still open.  2:25pm Spoke with patient at bedside to advised the only bed offer she has is for [facility]. Patient stated her concern of her BIPAP machine being broken. She has been advised the BIPAP has not been determined broken or malfunctioning however it will be check upon her arrival to the facility as they have to check the machine with patient's usage. She is agreeable to return to facility.  Call made to [facility]. Spoke with Tammy. She will check to coordinate with the vendor to check patient's BIPAP.  MD made aware.      Expected Discharge Plan: Skilled Nursing Facility Barriers to Discharge: Continued Medical Work up  Expected Discharge Plan and Services     Post Acute Care Choice: Resumption of Svcs/PTA Provider Living arrangements for the past 2 months: Skilled Nursing Facility                                       Social Determinants of Health (SDOH) Interventions SDOH Screenings   Food Insecurity: No Food Insecurity (06/14/2024)  Housing: Low Risk  (06/14/2024)  Transportation Needs: No Transportation Needs (06/14/2024)  Utilities: Not At Risk (06/14/2024)  Financial Resource Strain: High Risk (05/24/2024)  Social Connections: Socially Isolated (06/14/2024)   Tobacco Use: Medium Risk (06/14/2024)    Readmission Risk Interventions     No data to display

## 2024-06-30 NOTE — Progress Notes (Signed)
 Occupational Therapy Treatment Patient Details Name: [name] MRN: [id number] DOB: 08-31-1956 Today's Date: 06/30/2024   History of present illness [name] is a 68 y.o. female with medical history significant for morbid obesity with BMI of 45.73, history of atrial fibrillation status post cardioversion 05/25 on chronic anticoagulation therapy, history of DVT, chronic respiratory failure on 6 L via nasal cannula and BiPAP at night, history of hypothyroidism, rheumatoid arthritis, varicose veins involving her lower extremities, history of recurrent epistaxis who presents to the emergency room from the skilled nursing facility where she resides for evaluation of change in mental status and fall resulting in LLE laceration.   OT comments  Pt endorses 9/10 pain in LLE but is agreeable to participating in OT. Pt is able to move supine<>sit with SUPV. Able to come into standing with RW and Min A from elevated surface, able to maintain standing balance for < 1 minute, 2/2 fatigue, SOB. Demonstrates fair static sitting and standing balance. Upon returning to supine, per pt request, she is able to use RLE to help reposition herself to [facility]. Provided educ re: slowing breathing, taking deep breaths, ECS. Nurse notified of pt's pain level. Pt will continue to benefit from OT while hospitalized, to improve strength, endurance, and ability to perform ADLs with a higher degree of independence.      If plan is discharge home, recommend the following:  Assist for transportation;Assistance with cooking/housework;Help with stairs or ramp for entrance;A lot of help with bathing/dressing/bathroom;A lot of help with walking and/or transfers   Equipment Recommendations  None recommended by OT    Recommendations for Other Services      Precautions / Restrictions Precautions Precautions: Fall Recall of Precautions/Restrictions: Intact Restrictions Weight Bearing Restrictions Per Provider Order: No        Mobility Bed Mobility Overal bed mobility: Needs Assistance Bed Mobility: Supine to Sit, Sit to Supine     Supine to sit: Supervision Sit to supine: Supervision   General bed mobility comments: Mod A for repositioning in supine to [facility]    Transfers Overall transfer level: Needs assistance Equipment used: Rolling walker (2 wheels) Transfers: Sit to/from Stand Sit to Stand: From elevated surface, Min assist                 Balance Overall balance assessment: Needs assistance Sitting-balance support: Single extremity supported, Bilateral upper extremity supported, Feet supported Sitting balance-Leahy Scale: Fair     Standing balance support: Bilateral upper extremity supported, Reliant on assistive device for balance Standing balance-Leahy Scale: Fair Standing balance comment: Limited standing balance tolerance                           ADL either performed or assessed with clinical judgement   ADL Overall ADL's : Needs assistance/impaired Eating/Feeding: Set up;Sitting   Grooming: Wash/dry face;Wash/dry hands;Set up;Supervision/safety                                      Extremity/Trunk Assessment Upper Extremity Assessment Upper Extremity Assessment: Generalized weakness   Lower Extremity Assessment Lower Extremity Assessment: Generalized weakness        Vision       Perception     Praxis     Communication Communication Communication: No apparent difficulties   Cognition Arousal: Alert Behavior During Therapy: WFL for tasks assessed/performed, Anxious Cognition: No apparent impairments  Cueing      Exercises Other Exercises Other Exercises: Provided educ  re: ECS, breathing techniques, gradually increasing physical activity to improve strength and endurance    Shoulder Instructions       General Comments      Pertinent Vitals/ Pain       Pain  Assessment Pain Score: 9  Pain Location: LLE Pain Descriptors / Indicators: Burning, Radiating, Aching, Grimacing, Guarding Pain Intervention(s): Limited activity within patient's tolerance, Repositioned, Monitored during session, Patient requesting pain meds-RN notified  Home Living                                          Prior Functioning/Environment              Frequency  Min 2X/week        Progress Toward Goals  OT Goals(current goals can now be found in the care plan section)  Progress towards OT goals: Progressing toward goals  Acute Rehab OT Goals Time For Goal Achievement: 07/13/24 Potential to Achieve Goals: Good  Plan      Co-evaluation                 AM-PAC OT 6 Clicks Daily Activity     Outcome Measure   Help from another person eating meals?: None Help from another person taking care of personal grooming?: A Little Help from another person toileting, which includes using toliet, bedpan, or urinal?: A Lot Help from another person bathing (including washing, rinsing, drying)?: A Lot Help from another person to put on and taking off regular upper body clothing?: A Little Help from another person to put on and taking off regular lower body clothing?: A Lot 6 Click Score: 16    End of Session Equipment Utilized During Treatment: Rolling walker (2 wheels);Oxygen  OT Visit Diagnosis: Other abnormalities of gait and mobility (R26.89);Pain;Unsteadiness on feet (R26.81);Muscle weakness (generalized) (M62.81) Pain - Right/Left: Left Pain - part of body: Leg   Activity Tolerance Patient tolerated treatment well   Patient Left in bed;with call bell/phone within reach   Nurse Communication Patient requests pain meds;Mobility status        Time: 8669-8657 OT Time Calculation (min): 12 min  Charges: OT General Charges $OT Visit: 1 Visit OT Treatments $Self Care/Home Management : 8-22 mins Suzen Hock, PhD, MS,  OTR/L 06/30/24, 1:57 PM

## 2024-07-01 DIAGNOSIS — J9622 Acute and chronic respiratory failure with hypercapnia: Secondary | ICD-10-CM | POA: Diagnosis not present

## 2024-07-01 DIAGNOSIS — M069 Rheumatoid arthritis, unspecified: Secondary | ICD-10-CM

## 2024-07-01 DIAGNOSIS — J9601 Acute respiratory failure with hypoxia: Secondary | ICD-10-CM | POA: Diagnosis not present

## 2024-07-01 DIAGNOSIS — D649 Anemia, unspecified: Secondary | ICD-10-CM

## 2024-07-01 DIAGNOSIS — R55 Syncope and collapse: Secondary | ICD-10-CM | POA: Diagnosis not present

## 2024-07-01 DIAGNOSIS — E66813 Obesity, class 3: Secondary | ICD-10-CM

## 2024-07-01 DIAGNOSIS — S81812S Laceration without foreign body, left lower leg, sequela: Secondary | ICD-10-CM

## 2024-07-01 MED ORDER — DILTIAZEM HCL ER COATED BEADS 120 MG PO CP24: 120.0000 mg | ORAL_CAPSULE | Freq: Every day | ORAL | Status: DC

## 2024-07-01 MED ORDER — FUROSEMIDE 40 MG PO TABS: 40.0000 mg | ORAL_TABLET | Freq: Every day | ORAL | Status: DC

## 2024-07-01 MED ORDER — SERTRALINE HCL 25 MG PO TABS: 75.0000 mg | ORAL_TABLET | Freq: Every day | ORAL | Status: AC

## 2024-07-01 NOTE — TOC Transition Note (Signed)
 Transition of Care [facility]) - Discharge Note   Patient Details  Name: [name] MRN: [id number] Date of Birth: [birth year]  Transition of Care [facility]) CM/SW Contact:  Ceniya Fowers C Marla Pouliot, RN Phone Number: 07/01/2024, 10:55 AM   Clinical Narrative:    Spoke with Tammy in admissions . Patient admission confirmed for today for [facility] Patient assigned room # 204 Report will be called to [phone] Face sheet and medical necessity forms printed to the floor to be added to the EMS pack EMS arranged Be there shortly.  Discharge summary and SNF transfer report sent in HUB.  Nurse, provided with details for discharge. Patient and family notified spoke with Delon. Delon inquired about vender being confirmed to come to facility today. Delon advised per [facility] admissions patient can return to faicilty and they will coordinate a vendor time for BIPAP check.    TOC signing off.    Final next level of care: Skilled Nursing Facility Barriers to Discharge: Barriers Resolved   Patient Goals and CMS Choice Patient states their goals for this hospitalization and ongoing recovery are:: SNF CMS Medicare.gov Compare Post Acute Care list provided to:: Patient        Discharge Placement              Patient chooses bed at: [facility] [address] Patient to be transferred to facility by: [facility] Name of family member notified: Delon, Daughter-in-law Patient and family notified of of transfer: 07/01/24  Discharge Plan and Services Additional resources added to the After Visit Summary for       Post Acute Care Choice: Resumption of Svcs/PTA Provider                               Social Drivers of Health (SDOH) Interventions SDOH Screenings   Food Insecurity: No Food Insecurity (06/14/2024)  Housing: Low Risk  (06/14/2024)  Transportation Needs: No Transportation Needs (06/14/2024)  Utilities: Not At Risk (06/14/2024)  Financial Resource Strain: High Risk  (05/24/2024)  Social Connections: Socially Isolated (06/14/2024)  Tobacco Use: Medium Risk (06/14/2024)     Readmission Risk Interventions     No data to display

## 2024-07-01 NOTE — Plan of Care (Signed)
  Problem: Coping: Goal: Ability to adjust to condition or change in health will improve Outcome: Progressing   Problem: Fluid Volume: Goal: Ability to maintain a balanced intake and output will improve Outcome: Progressing   Problem: Nutritional: Goal: Maintenance of adequate nutrition will improve Outcome: Progressing   Problem: Skin Integrity: Goal: Risk for impaired skin integrity will decrease Outcome: Progressing   Problem: Clinical Measurements: Goal: Ability to maintain clinical measurements within normal limits will improve Outcome: Progressing

## 2024-07-01 NOTE — Plan of Care (Signed)

## 2024-07-01 NOTE — Progress Notes (Signed)
 Small amt of blood noted on pad under pt. Pt states scratching her bottom just prior. Bottom noted with scratches but not bleeding. Pt to be transferred out. Advised pt if more blood noted to advise staff at facility. Pt voices understanding.

## 2024-07-01 NOTE — Discharge Summary (Addendum)
 Physician Discharge Summary   Patient: [name] MRN: [id number] DOB: May 20, 1956  Admit date:     06/14/2024  Discharge date: 07/01/24  Discharge Physician: Amaryllis Dare   PCP: [name] DASEN, PA-C   Recommendations at discharge:  Please obtain CBC and BMP for follow-up Please continue with daily dressing change for wound care Please keep lower extremities elevated while resting. Follow-up with primary care provider Follow-up with general surgery Follow-up with pulmonology.  Discharge Diagnoses: Principal Problem:   Acute hypoxic on chronic hypercapnic respiratory failure (HCC) Active Problems:   Acute on chronic heart failure with preserved ejection fraction (HFpEF) (HCC)   ABLA (acute blood loss anemia)   Laceration of left lower leg   Atrial fibrillation (HCC)   Hypothyroidism   Morbid obesity (HCC)   Rheumatoid arthritis (HCC)   Obesity, Class III, BMI 40-49.9 (morbid obesity)   Fall   Anemia   Syncope   Traumatic hematoma of left lower leg   Hospital Course: [name] is a 68 y.o. female with medical history significant for morbid obesity with BMI of 45.73, history of atrial fibrillation status post cardioversion 05/25 on chronic anticoagulation therapy, history of DVT, chronic respiratory failure on 6 L via nasal cannula and BiPAP at night, history of hypothyroidism, rheumatoid arthritis, varicose veins involving her lower extremities, history of recurrent epistaxis who presented to the emergency room from the skilled nursing facility where she resides for evaluation of change in mental status.   She was found by the nursing home staff on the floor and hypoxic with room air pulse oximetry in the 60s.  Patient was found to have acute on chronic hypoxic and hypercapnic respiratory failure, likely secondary to nonfunctional BiPAP at facility.  Patient responded well with a proper BiPAP, initially required 10 L of oxygen but slowly weaned back to 6 L.  She  remained stable on 6 L of oxygen since 7/11.  Patient need to have a proper functioning BiPAP at facility which she should use at night and while taking naps during the day.  Communication was done multiple times with facility administration.  Patient was treated with steroid and antibiotics which she completed while in the hospital.  She will continue her bronchodilator therapy and follow-up with her pulmonologist for further assistance.  For concern of acute on chronic HFpEF, she initially received IV Lasix  which was later transitioned to home oral p.o. of 80 mg daily, dose was decreased to 40 mg due to worsening creatinine.  Creatinine now improved and patient will resume her home diet and follow-up with her cardiologist for further assistance.  Patient had a large laceration of left leg secondary to the fall at facility, sutures were placed initially with ED provider.  She completed a 7-day course of Bactrim  and no recent sign of infection.  Continue to have significant pain and required premedications for dressing change.  Due to little worsening of swelling just below the suture line a ultrasound was obtained which shows concern of liquefying hematoma, general surgery was consulted and they removed the sutures on 06/30/2024 with intention for spontaneous drainage.  Patient need to continue with wound care and daily dressing change.  Patient also has acute blood loss anemia with history of chronic anemia secondary to fall and significant blood loss.  Received 3 unit of PRBC during current hospitalization and hemoglobin now stable at 9.4.  Patient also has an history of paroxysmal atrial fibrillation, had a cardioversion done in May 2025 and remained in  sinus rhythm.  She will continue home Cardizem .  Xarelto  was initially held due to concern of bleeding and then later resumed.  She will continue and follow-up with cardiology.  Patient has some periodic electrolyte abnormalities which were repleted  during hospitalization.  She will continue on current medications and follow-up with her providers for further assistance.  Obesity, Class III, BMI 40-49.9 (morbid obesity) Complicates overall prognosis and care Lifestyle modification and exercise has been discussed with patient in detail  Rheumatoid arthritis (HCC) Continue prednisone  and hydroxychloroquine   Hypothyroidism Continue Synthroid    Pain control - [place]  Controlled Substance Reporting System database was reviewed. and patient was instructed, not to drive, operate heavy machinery, perform activities at heights, swimming or participation in water activities or provide baby-sitting services while on Pain, Sleep and Anxiety Medications; until their outpatient Physician has advised to do so again. Also recommended to not to take more than prescribed Pain, Sleep and Anxiety Medications.  Consultants: General Surgery. Procedures performed: Suturing of left leg laceration and removal of sutures on 7/16 Disposition: Skilled nursing facility Diet recommendation:  Cardiac diet DISCHARGE MEDICATION: Allergies as of 07/01/2024       Reactions   Latex Hives, Itching, Dermatitis   Blisters (also) Skin blisters   Oxycodone -acetaminophen  Nausea Only, Other (See Comments)   Violent Vomiting Violent Vomiting   Penicillins Anaphylaxis, Swelling, Rash   Has patient had a PCN reaction causing immediate rash, facial/tongue/throat swelling, SOB or lightheadedness with hypotension: Yes Has patient had a PCN reaction causing severe rash involving mucus membranes or skin necrosis: No Has patient had a PCN reaction that required hospitalization No Has patient had a PCN reaction occurring within the last 10 years: No If all of the above answers are NO, then may proceed with Cephalosporin use.   Strawberry Extract Hives, Itching, Rash, Anaphylaxis, Dermatitis, Swelling   Oxycodone -acetaminophen  Nausea And Vomiting   Tyloxapol Nausea And  Vomiting   Adhesive [tape] Rash   Patient prefers paper tape   Wound Dressing Adhesive Dermatitis        Medication List     STOP taking these medications    amiodarone  200 MG tablet Commonly known as: PACERONE    insulin  lispro 100 UNIT/ML injection Commonly known as: HUMALOG   levofloxacin  750 MG tablet Commonly known as: Levaquin        TAKE these medications    acetaminophen  500 MG tablet Commonly known as: TYLENOL  Take 500 mg by mouth every 8 (eight) hours as needed for mild pain (pain score 1-3).   atorvastatin  10 MG tablet Commonly known as: LIPITOR Take 10 mg by mouth at bedtime.   benzonatate 100 MG capsule Commonly known as: TESSALON Take 100 mg by mouth every 8 (eight) hours as needed for cough.   budesonide  0.5 MG/2ML nebulizer solution Commonly known as: PULMICORT  Take 0.5 mg by nebulization 2 (two) times daily.   Cyanocobalamin  1000 MCG Tbcr Take 1 tablet by mouth daily.   diltiazem  120 MG 24 hr capsule Commonly known as: CARDIZEM  CD Take 1 capsule (120 mg total) by mouth daily. What changed:  medication strength how much to take   ferrous sulfate  325 (65 FE) MG EC tablet Take 325 mg by mouth daily with breakfast.   folic acid  1 MG tablet Commonly known as: FOLVITE  Take 1 mg by mouth daily.   furosemide  40 MG tablet Commonly known as: LASIX  Take 1 tablet (40 mg total) by mouth daily. What changed:  medication strength how much to take  gabapentin  400 MG capsule Commonly known as: NEURONTIN  Take 400 mg by mouth 3 (three) times daily.   hydroxychloroquine  200 MG tablet Commonly known as: PLAQUENIL  Take 200 mg by mouth daily.   ipratropium-albuterol  0.5-2.5 (3) MG/3ML Soln Commonly known as: DUONEB Take 3 mLs by nebulization every 6 (six) hours.   levothyroxine  150 MCG tablet Commonly known as: SYNTHROID  Take 150 mcg by mouth daily before breakfast.   melatonin 5 MG Tabs Take 5 mg by mouth at bedtime.   montelukast  10 MG  tablet Commonly known as: SINGULAIR  Take 10 mg by mouth daily.   multivitamin tablet Take 1 tablet by mouth daily.   naloxone 4 MG/0.1ML Liqd nasal spray kit Commonly known as: NARCAN Place 1 spray into the nose 3 (three) times daily as needed (symptoms of opiod overdose).   oxymetazoline  0.05 % nasal spray Commonly known as: AFRIN Place 1 spray into both nostrils 2 (two) times daily.   pantoprazole  40 MG tablet Commonly known as: PROTONIX  Take 40 mg by mouth daily.   predniSONE  5 MG tablet Commonly known as: DELTASONE  Take 5 mg by mouth daily with breakfast.   rivaroxaban  20 MG Tabs tablet Commonly known as: XARELTO  Take 20 mg by mouth daily.   sennosides-docusate sodium  8.6-50 MG tablet Commonly known as: SENOKOT-S Take 2 tablets by mouth 2 (two) times daily.   sertraline  25 MG tablet Commonly known as: ZOLOFT  Take 3 tablets (75 mg total) by mouth daily. What changed:  how much to take additional instructions Another medication with the same name was removed. Continue taking this medication, and follow the directions you see here.   traMADol  HCl 25 MG Tabs Take by mouth. PRN               Discharge Care Instructions  (From admission, onward)           Start     Ordered   07/01/24 0000  Discharge wound care:       Comments: Cleanse R knee/leg laceration with Vashe wound cleanser Soila [phone]) do not rinse and allow to air dry. Apply Vashe moistened gauze to open wound bed and cover with small dry gauze. Apply Xeroform Gauze Soila [phone]) to intact sutures and area of ecchymosis/hematoma.  Cover entire dressing with ABD pad and Kerlix roll gauze.  May cover with Ace bandage wrapped in same fashion as Kerlix to help secure dressing.   07/01/24 1033            Contact information for follow-up providers     [facility]. Go on 07/05/2024.   Specialty: Cardiology Why: Hospital Follow-Up 07/05/24 @  1:30PM Please bring all medications to follow-up appt [facility], [address] Free [employer] [place] at the door. Contact information: [address] [place]  [address] [phone]        Lane Shope, MD. Schedule an appointment as soon as possible for a visit on 07/08/2024.   Specialty: General Surgery Why: For wound re-check Contact information: [address] [place] [address] [phone]              Contact information for after-discharge care     Destination     [facility] [place], [place]. [place]   Service: Skilled Nursing Contact information: [address] [place]  [address] [phone]                    Discharge  Exam: Filed Weights   06/14/24 0938 06/23/24 1600  Weight: 113.4 kg 98.5 kg   General.  Obese lady, in no acute distress. Pulmonary.  Lungs clear bilaterally, normal respiratory effort. CV.  Regular rate and rhythm, no JVD, rub or murmur. Abdomen.  Soft, nontender, nondistended, BS positive. CNS.  Alert and oriented .  No focal neurologic deficit. Extremities.  Left leg with mild edema and bandage Psychiatry.  Judgment and insight appears normal.   Condition at discharge: stable  The results of significant diagnostics from this hospitalization (including imaging, microbiology, ancillary and laboratory) are listed below for reference.   Imaging Studies: US  LT LOWER EXTREM LTD SOFT TISSUE NON VASCULAR Result Date: 06/29/2024 CLINICAL DATA:  Fall with left leg injury. Question fluid collection. Worsening edema. EXAM: ULTRASOUND LEFT LOWER EXTREMITY LIMITED TECHNIQUE: Ultrasound examination of the lower extremity soft tissues was performed in the area of clinical concern. COMPARISON:  None Available. FINDINGS: There is a complex fluid collection noted in the anterior superficial soft tissues in the left lower leg in the area of swelling measuring 11.7  x 10 x 2.5 cm. This likely represents liquified hematoma. IMPRESSION: Complex fluid collection in the subcutaneous soft tissues, likely liquefied hematoma. Electronically Signed   By: Franky Crease M.D.   On: 06/29/2024 16:01   DG Chest Port 1 View Result Date: 06/16/2024 CLINICAL DATA:  Shortness of breath. EXAM: PORTABLE CHEST 1 VIEW COMPARISON:  06/14/2024. FINDINGS: Redemonstration of markedly elevated right hemidiaphragm. There are probable atelectatic changes at the lung bases, right more than left. Stable 5 mm calcified granuloma overlying the right mid lung zone, laterally. There are increased interstitial markings throughout bilateral lungs, essentially similar to the prior study. Findings are nonspecific and differential diagnosis includes underlying emphysema, chronic interstitial lung disease or pulmonary edema. Correlate clinically. Stable cardio-mediastinal silhouette. No acute osseous abnormalities. The soft tissues are within normal limits. IMPRESSION: *No significant interval change since the prior study. Redemonstration of markedly elevated right hemidiaphragm with probable atelectatic changes at the lung bases, right more than left. Increased interstitial markings throughout bilateral lungs, essentially similar to the prior study. Electronically Signed   By: Ree Molt M.D.   On: 06/16/2024 16:58   CT CHEST WO CONTRAST Result Date: 06/14/2024 CLINICAL DATA:  Provided history for unenhanced exam: Respiratory illness, nondiagnostic xray; Provided history for contrast enhanced exam: Pneumonia, complication suspected, xray done Per technologist notes, fall today. EXAM: CT CHEST WITHOUT CONTRAST CT CHEST WITH CONTRAST TECHNIQUE: Please note, unenhanced CT was initially performed. 10 minutes later contrast-enhanced CT was performed. Both exams will be reported together. Multidetector CT imaging of the chest was performed following the standard protocol without IV contrast. Subsequently,  multidetector CT imaging of the chest was performed during intravenous contrast administration. RADIATION DOSE REDUCTION: This exam was performed according to the departmental dose-optimization program which includes automated exposure control, adjustment of the mA and/or kV according to patient size and/or use of iterative reconstruction technique. CONTRAST:  75 cc Omnipaque  300 IV COMPARISON:  Chest radiograph earlier today, chest CT 01/11/2020 FINDINGS: Cardiovascular: The heart is upper normal in size. There mitral annulus calcifications. The main pulmonary artery is dilated at 4.5 cm. Moderate atherosclerosis of the thoracic aorta. The ascending aorta is dilated at 4.1 cm. No evidence of aortic injury or acute aortic finding. Plaque in the descending aorta is irregular. There is no pericardial effusion. Mediastinum/Nodes: Scattered small mediastinal lymph nodes, all subcentimeter short axis. No suspicious lymphadenopathy. Unremarkable esophagus. Lungs/Pleura: Emphysema. Elevated right  hemidiaphragm with associated atelectasis or scarring in the right lower lobe. Additional areas of bandlike atelectasis throughout both lungs. Dependent nodular densities in the right upper lobe suspicious for infection. Trace pleural thickening/effusions. There are multiple calcified granuloma in the right lung. No debris in the trachea or central airways. No pneumothorax. Upper Abdomen: Suspected hepatic steatosis. Cyst in the right upper kidney. No further follow-up imaging is recommended. Musculoskeletal: Mild compression deformities from T4 through T8 is well as involving T11, chronic. Chronic vertebral body hemangioma. No acute rib fractures. IMPRESSION: 1. Dependent nodular densities in the right upper lobe suspicious for infection. 2. Elevated right hemidiaphragm with associated atelectasis or scarring in the right lower lobe. Additional areas of bandlike atelectasis throughout both lungs. 3. Dilated main pulmonary artery  at 4.5 cm, can be seen with pulmonary arterial hypertension. 4. No evidence of traumatic injury related to fall. 5. Aortic atherosclerosis and coronary artery calcifications. Plaque in the descending aorta is irregular. Aortic Atherosclerosis (ICD10-I70.0).  Emphysema (ICD10-J43.9). Electronically Signed   By: Andrea Gasman M.D.   On: 06/14/2024 17:17   CT CHEST W CONTRAST Result Date: 06/14/2024 CLINICAL DATA:  Provided history for unenhanced exam: Respiratory illness, nondiagnostic xray; Provided history for contrast enhanced exam: Pneumonia, complication suspected, xray done Per technologist notes, fall today. EXAM: CT CHEST WITHOUT CONTRAST CT CHEST WITH CONTRAST TECHNIQUE: Please note, unenhanced CT was initially performed. 10 minutes later contrast-enhanced CT was performed. Both exams will be reported together. Multidetector CT imaging of the chest was performed following the standard protocol without IV contrast. Subsequently, multidetector CT imaging of the chest was performed during intravenous contrast administration. RADIATION DOSE REDUCTION: This exam was performed according to the departmental dose-optimization program which includes automated exposure control, adjustment of the mA and/or kV according to patient size and/or use of iterative reconstruction technique. CONTRAST:  75 cc Omnipaque  300 IV COMPARISON:  Chest radiograph earlier today, chest CT 01/11/2020 FINDINGS: Cardiovascular: The heart is upper normal in size. There mitral annulus calcifications. The main pulmonary artery is dilated at 4.5 cm. Moderate atherosclerosis of the thoracic aorta. The ascending aorta is dilated at 4.1 cm. No evidence of aortic injury or acute aortic finding. Plaque in the descending aorta is irregular. There is no pericardial effusion. Mediastinum/Nodes: Scattered small mediastinal lymph nodes, all subcentimeter short axis. No suspicious lymphadenopathy. Unremarkable esophagus. Lungs/Pleura: Emphysema.  Elevated right hemidiaphragm with associated atelectasis or scarring in the right lower lobe. Additional areas of bandlike atelectasis throughout both lungs. Dependent nodular densities in the right upper lobe suspicious for infection. Trace pleural thickening/effusions. There are multiple calcified granuloma in the right lung. No debris in the trachea or central airways. No pneumothorax. Upper Abdomen: Suspected hepatic steatosis. Cyst in the right upper kidney. No further follow-up imaging is recommended. Musculoskeletal: Mild compression deformities from T4 through T8 is well as involving T11, chronic. Chronic vertebral body hemangioma. No acute rib fractures. IMPRESSION: 1. Dependent nodular densities in the right upper lobe suspicious for infection. 2. Elevated right hemidiaphragm with associated atelectasis or scarring in the right lower lobe. Additional areas of bandlike atelectasis throughout both lungs. 3. Dilated main pulmonary artery at 4.5 cm, can be seen with pulmonary arterial hypertension. 4. No evidence of traumatic injury related to fall. 5. Aortic atherosclerosis and coronary artery calcifications. Plaque in the descending aorta is irregular. Aortic Atherosclerosis (ICD10-I70.0).  Emphysema (ICD10-J43.9). Electronically Signed   By: Andrea Gasman M.D.   On: 06/14/2024 17:17   CT Cervical Spine Wo Contrast Result Date:  06/14/2024 CLINICAL DATA:  Neck trauma (Age >= 65y) EXAM: CT CERVICAL SPINE WITHOUT CONTRAST TECHNIQUE: Multidetector CT imaging of the cervical spine was performed without intravenous contrast. Multiplanar CT image reconstructions were also generated. RADIATION DOSE REDUCTION: This exam was performed according to the departmental dose-optimization program which includes automated exposure control, adjustment of the mA and/or kV according to patient size and/or use of iterative reconstruction technique. COMPARISON:  None Available. FINDINGS: Alignment: Anatomic. Skull base and  vertebrae: Intact. No osseous lesions are present. There is fusion of the left facets at C2-3. Soft tissues and spinal canal: No paraspinous hematoma or soft tissue swelling present. Moderate calcifications within the carotid bulbs bilaterally. Surgical clips in the anterior neck near the thyroid  fossa. Disc levels: Diffuse degenerative disc disease with mild endplate ridging and mild-to-moderate bilateral facet arthrosis throughout the cervical spine. No apparent disc herniation or significant spinal canal or neural foraminal stenosis. Upper chest: Emphysema. There are also ground-glass and reticular pulmonary opacities. Other: None. IMPRESSION: 1. Moderate diffuse chronic degenerative disc disease and facet arthropathy without evidence of acute traumatic injury. 2. Emphysema. Electronically Signed   By: Evalene Coho M.D.   On: 06/14/2024 12:55   CT Head Wo Contrast Result Date: 06/14/2024 CLINICAL DATA:  Head trauma, minor (Age >= 65y) EXAM: CT HEAD WITHOUT CONTRAST TECHNIQUE: Contiguous axial images were obtained from the base of the skull through the vertex without intravenous contrast. RADIATION DOSE REDUCTION: This exam was performed according to the departmental dose-optimization program which includes automated exposure control, adjustment of the mA and/or kV according to patient size and/or use of iterative reconstruction technique. COMPARISON:  CT of the head dated June 13, 2024. FINDINGS: Brain: Mild periventricular white matter disease. Otherwise normal brain. No evidence of hemorrhage, mass, cortical infarct or hydrocephalus. Vascular: Mild calcific atheromatous disease within the carotid siphons. Skull: Intact and unremarkable.  No osseous lesions present. Sinuses/Orbits: Moderate opacification of the right maxillary sinus wall with radio dense secretions. Normal orbits. Other: None. IMPRESSION: 1. Mild periventricular white matter disease. 2. Chronic appearing right paranasal sinus disease.  Electronically Signed   By: Evalene Coho M.D.   On: 06/14/2024 12:50   DG Knee 2 Views Left Result Date: 06/14/2024 CLINICAL DATA:  Fall.  Pain. EXAM: LEFT KNEE - 1-2 VIEW COMPARISON:  None Available. FINDINGS: Slight lateral subluxation of the proximal tibia with respect to the distal femur. Subchondral sclerosis, osteophytosis and marked loss of joint space in the medial compartment. No definite joint effusion. No fracture. IMPRESSION: 1. No acute findings. 2. Left knee osteoarthritis, worst in the medial compartment. Electronically Signed   By: Newell Eke M.D.   On: 06/14/2024 10:28   DG Ankle Complete Left Result Date: 06/14/2024 CLINICAL DATA:  Fall. EXAM: LEFT ANKLE COMPLETE - 3+ VIEW COMPARISON:  None Available. FINDINGS: Osteopenia. No acute osseous abnormality. Degenerative changes in the ankle joint. IMPRESSION: 1. No acute osseous abnormality. 2. Degenerative changes in the ankle joint. Electronically Signed   By: Newell Eke M.D.   On: 06/14/2024 10:27   DG Chest Port 1 View Result Date: 06/14/2024 CLINICAL DATA:  Hypoxia, fall. EXAM: PORTABLE CHEST 1 VIEW COMPARISON:  06/13/2024 and CT chest 04/20/2024. FINDINGS: Trachea is midline. Heart is enlarged. Thoracic aorta is calcified. Lungs are low in volume with mixed interstitial and airspace opacification, similar to yesterday's exam. Elevated right hemidiaphragm. No definite pleural fluid. IMPRESSION: Persistent mixed interstitial and airspace opacification may be due to edema or pneumonia. Electronically Signed   By:  Newell Eke M.D.   On: 06/14/2024 10:26   CT Head Wo Contrast Result Date: 06/13/2024 CLINICAL DATA:  Head trauma, minor (Age >= 65y) Unwitnessed fall in the bathroom. EXAM: CT HEAD WITHOUT CONTRAST TECHNIQUE: Contiguous axial images were obtained from the base of the skull through the vertex without intravenous contrast. RADIATION DOSE REDUCTION: This exam was performed according to the departmental  dose-optimization program which includes automated exposure control, adjustment of the mA and/or kV according to patient size and/or use of iterative reconstruction technique. COMPARISON:  None Available. FINDINGS: Brain: No intracranial hemorrhage, mass effect, or midline shift. No hydrocephalus. The basilar cisterns are patent. Mild periventricular white matter hypodensity typical of chronic small vessel ischemia. No evidence of territorial infarct or acute ischemia. No extra-axial or intracranial fluid collection. Vascular: Atherosclerosis of skullbase vasculature without hyperdense vessel or abnormal calcification. Skull: No fracture or focal lesion. Sinuses/Orbits: Mucosal thickening with high density within the right maxillary sinus, however no evidence of facial bone fracture. Near complete opacification of left mastoid air cells, partial opacification of right mastoid air cells. Other: None. IMPRESSION: 1. No acute intracranial abnormality. No skull fracture. 2. Mild chronic small vessel ischemia. 3. Mucosal thickening with high density within the right maxillary sinus, however no evidence of facial bone fracture. This may be related to chronic sinusitis, including fungal infection. 4. Near complete opacification of left mastoid air cells, partial opacification of right mastoid air cells. Electronically Signed   By: Andrea Gasman M.D.   On: 06/13/2024 18:42   DG Chest 1 View Result Date: 06/13/2024 CLINICAL DATA:  hypoxia EXAM: CHEST  1 VIEW COMPARISON:  May 31, 2024 FINDINGS: Diffuse interstitial opacities throughout both lungs. Elevation of the right hemidiaphragm is unchanged. No focal airspace consolidation, pleural effusion, or pneumothorax. No cardiomegaly. Tortuous aorta with aortic atherosclerosis. No acute fracture or destructive lesions. Multilevel thoracic osteophytosis. IMPRESSION: Cardiomegaly with findings of either interstitial edema or viral/atypical infection. Electronically Signed    By: Rogelia Myers M.D.   On: 06/13/2024 18:16    Microbiology: Results for orders placed or performed during the hospital encounter of 05/30/24  Resp panel by RT-PCR (RSV, Flu A&B, Covid) Anterior Nasal Swab     Status: None   Collection Time: 05/30/24  2:25 PM   Specimen: Anterior Nasal Swab  Result Value Ref Range Status   SARS Coronavirus 2 by RT PCR NEGATIVE NEGATIVE Final    Comment: (NOTE) SARS-CoV-2 target nucleic acids are NOT DETECTED.  The SARS-CoV-2 RNA is generally detectable in upper respiratory specimens during the acute phase of infection. The lowest concentration of SARS-CoV-2 viral copies this assay can detect is 138 copies/mL. A negative result does not preclude SARS-Cov-2 infection and should not be used as the sole basis for treatment or other patient management decisions. A negative result may occur with  improper specimen collection/handling, submission of specimen other than nasopharyngeal swab, presence of viral mutation(s) within the areas targeted by this assay, and inadequate number of viral copies(<138 copies/mL). A negative result must be combined with clinical observations, patient history, and epidemiological information. The expected result is Negative.  Fact Sheet for Patients:  BloggerCourse.com  Fact Sheet for Healthcare Providers:  SeriousBroker.it  This test is no t yet approved or cleared by the [place]  FDA and  has been authorized for detection and/or diagnosis of SARS-CoV-2 by FDA under an Emergency Use Authorization (EUA). This EUA will remain  in effect (meaning this test can be used) for the duration of  the COVID-19 declaration under Section 564(b)(1) of the Act, 21 U.S.C.section 360bbb-3(b)(1), unless the authorization is terminated  or revoked sooner.       Influenza A by PCR NEGATIVE NEGATIVE Final   Influenza B by PCR NEGATIVE NEGATIVE Final    Comment: (NOTE) The Xpert  Xpress SARS-CoV-2/FLU/RSV plus assay is intended as an aid in the diagnosis of influenza from Nasopharyngeal swab specimens and should not be used as a sole basis for treatment. Nasal washings and aspirates are unacceptable for Xpert Xpress SARS-CoV-2/FLU/RSV testing.  Fact Sheet for Patients: BloggerCourse.com  Fact Sheet for Healthcare Providers: SeriousBroker.it  This test is not yet approved or cleared by the [place]  FDA and has been authorized for detection and/or diagnosis of SARS-CoV-2 by FDA under an Emergency Use Authorization (EUA). This EUA will remain in effect (meaning this test can be used) for the duration of the COVID-19 declaration under Section 564(b)(1) of the Act, 21 U.S.C. section 360bbb-3(b)(1), unless the authorization is terminated or revoked.     Resp Syncytial Virus by PCR NEGATIVE NEGATIVE Final    Comment: (NOTE) Fact Sheet for Patients: BloggerCourse.com  Fact Sheet for Healthcare Providers: SeriousBroker.it  This test is not yet approved or cleared by the [place]  FDA and has been authorized for detection and/or diagnosis of SARS-CoV-2 by FDA under an Emergency Use Authorization (EUA). This EUA will remain in effect (meaning this test can be used) for the duration of the COVID-19 declaration under Section 564(b)(1) of the Act, 21 U.S.C. section 360bbb-3(b)(1), unless the authorization is terminated or revoked.  Performed at [facility], [address] [place], [place] [address]   Blood culture (routine x 2)     Status: None   Collection Time: 05/30/24  2:25 PM   Specimen: BLOOD RIGHT ARM  Result Value Ref Range Status   Specimen Description BLOOD RIGHT ARM  Final   Special Requests   Final    BOTTLES DRAWN AEROBIC AND ANAEROBIC Blood Culture results may not be optimal due to an inadequate volume of blood received in  culture bottles   Culture   Final    NO GROWTH 5 DAYS Performed at [facility], [address] [place], [place] [address]    Report Status 06/04/2024 FINAL  Final  Blood culture (routine x 2)     Status: None   Collection Time: 05/30/24  3:01 PM   Specimen: BLOOD RIGHT HAND  Result Value Ref Range Status   Specimen Description BLOOD RIGHT HAND  Final   Special Requests   Final    BOTTLES DRAWN AEROBIC AND ANAEROBIC Blood Culture results may not be optimal due to an inadequate volume of blood received in culture bottles   Culture   Final    NO GROWTH 5 DAYS Performed at [facility], [address] [place], [place] [address]    Report Status 06/04/2024 FINAL  Final  MRSA Next Gen by PCR, Nasal     Status: None   Collection Time: 05/31/24  5:09 AM   Specimen: Nasal Mucosa; Nasal Swab  Result Value Ref Range Status   MRSA by PCR Next Gen NOT DETECTED NOT DETECTED Final    Comment: (NOTE) The GeneXpert MRSA Assay (FDA approved for NASAL specimens only), is one component of a comprehensive MRSA colonization surveillance program. It is not intended to diagnose MRSA infection nor to guide or monitor treatment for MRSA infections. Test performance is not FDA approved in patients less than 2 years  old. Performed at [facility], [address] [place], [place] [address]     Labs: CBC: Recent Labs  Lab 06/24/24 1209 06/25/24 0510 06/28/24 0624 06/30/24 0531  WBC 8.6  --  9.6  --   HGB 9.4* 9.6* 9.1* 9.4*  HCT 30.8*  --  30.0*  --   MCV 93.1  --  91.5  --   PLT 261  --  271  --    Basic Metabolic Panel: Recent Labs  Lab 06/25/24 0510 06/26/24 0601 06/27/24 0336 06/28/24 0624  NA 135 135 134* 135  K 3.9 3.7 3.6 3.1*  CL 82* 86* 87* 90*  CO2 41* 40* 36* 35*  GLUCOSE 86 88 85 97  BUN 49* 50* 47* 50*  CREATININE 0.99 1.01* 0.90 1.16*  CALCIUM  9.5 9.7 9.2 8.8*  MG  --   --   --  2.3   Liver Function Tests: No results for input(s):  AST, ALT, ALKPHOS, BILITOT, PROT, ALBUMIN in the last 168 hours. CBG: No results for input(s): GLUCAP in the last 168 hours.  Discharge time spent: greater than 30 minutes.  This record has been created using Conservation officer, historic buildings. Errors have been sought and corrected,but may not always be located. Such creation errors do not reflect on the standard of care.   Signed: Amaryllis Dare, MD [facility] 07/01/2024

## 2024-07-02 ENCOUNTER — Telehealth: Payer: Self-pay | Admitting: Family

## 2024-07-02 NOTE — Telephone Encounter (Signed)
 Called to confirm/remind patient of their appointment at the [facility] on 07/05/24.   Appointment:   [] Confirmed  [] Left mess   [] No answer/No voice mail  [x] VM Full/unable to leave message  [] Phone not in service  Patient reminded to bring all medications and/or complete list.  Confirmed patient has transportation. Gave directions, instructed to utilize valet parking.

## 2024-07-04 NOTE — Progress Notes (Unsigned)
 [facility] Note   Referring Physician: PCP: Marilyn Tari DASEN, [name] Cardiologist: None   Chief Complaint:    HPI:  [name] is a 68 y/o female with a history of COPD, anemia, RA, obesity, CHF, PE, T2DM, hypothyroidism, DVT, chronic hypoxic respiratory failure, varicose veins and afib. Cardioverted 05/25.   Admitted 05/30/24 with hypoxic respiratory failure with saturations in the 50s. On admission, BNP was 750.8, HS-troponin was 18, ferritin 127, TSAT 16, and lactic acid was 1.7. Chest x-ray noted pulmonary vascular congestion, edema vs infection, and small bilateral pleural effusions. Echo 05/31/24: LVEF of 55-60%, grade II diastolic dysfunction, moderate MR. Home bipap broken per patient. Found to be hypotensive but fluid responsive after 500 cc NS. Placed on bipap with improvement of symptoms.   Admitted 06/14/24 with a change in mental status noted by nursing home staff. Found to have acute on chronic hypoxic and hypercapnic respiratory failure, likely secondary to nonfunctional BiPAP at facility. Patient responded well with a proper BiPAP, initially required 10 L of oxygen but slowly weaned back to 6 L. IV diuresed. Had a large laceration of left leg secondary to the fall at facility, sutures were placed initially with ED provider. She completed a 7-day course of Bactrim  and no recent sign of infection. Continue to have significant pain and required premedications for dressing change. Due to little worsening of swelling just below the suture line a ultrasound was obtained which shows concern of liquefying hematoma, general surgery was consulted and they removed the sutures on 06/30/2024 with intention for spontaneous drainage. Received 3 units PRBC's for anemia. Has had continued issues with bipap at SNF.   She presents today for her initial HF visit with a chief complaint of    Pertinent cardiac history: Echo 06/2017 with LVEF of 60-65%, mild LVH, grade I diastolic  dysfunction. Echo 02/2018 noted LVEF of 60-65%, moderate mid & basal hypertrophy, and severe mitral calcification. Most recent echo performed by [facility] on 04/26/24 shwoed LVEF of 55-60%, mildly reduced RV function, mild PH, mild to mdoerate MR, and indeterminate diastolic function.   Review of Systems: [y] = yes, [ ]  = no   General: Weight gain [ ] ; Weight loss [ ] ; Anorexia [ ] ; Fatigue [ ] ; Fever [ ] ; Chills [ ] ; Weakness [ ]   Cardiac: Chest pain/pressure [ ] ; Resting SOB [ ] ; Exertional SOB [ ] ; Orthopnea [ ] ; Pedal Edema [ ] ; Palpitations [ ] ; Syncope [ ] ; Presyncope [ ] ; Paroxysmal nocturnal dyspnea[ ]   Pulmonary: Cough [ ] ; Wheezing[ ] ; Hemoptysis[ ] ; Sputum [ ] ; Snoring [ ]   GI: Vomiting[ ] ; Dysphagia[ ] ; Melena[ ] ; Hematochezia [ ] ; Heartburn[ ] ; Abdominal pain [ ] ; Constipation [ ] ; Diarrhea [ ] ; BRBPR [ ]   GU: Hematuria[ ] ; Dysuria [ ] ; Nocturia[ ]   Vascular: Pain in legs with walking [ ] ; Pain in feet with lying flat [ ] ; Non-healing sores [ ] ; Stroke [ ] ; TIA [ ] ; Slurred speech [ ] ;  Neuro: Headaches[ ] ; Vertigo[ ] ; Seizures[ ] ; Paresthesias[ ] ;Blurred vision [ ] ; Diplopia [ ] ; Vision changes [ ]   Ortho/Skin: Arthritis [ ] ; Joint pain [ ] ; Muscle pain [ ] ; Joint swelling [ ] ; Back Pain [ ] ; Rash [ ]   Psych: Depression[ ] ; Anxiety[ ]   Heme: Bleeding problems [ ] ; Clotting disorders [ ] ; Anemia [ ]   Endocrine: Diabetes [ ] ; Thyroid  dysfunction[ ]    Past Medical History:  Diagnosis Date   Arthritis    Dyspnea  W/ EXERTION, +  HUMIDITY    Emphysema lung (HCC)    GERD (gastroesophageal reflux disease)    Hashimoto's thyroiditis    Heart murmur    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      atorvastatin  (LIPITOR) 10 MG tablet Take 10 mg by mouth at bedtime.     benzonatate (TESSALON) 100 MG capsule Take 100 mg by mouth every 8 (eight) hours as needed for cough.     budesonide  (PULMICORT ) 0.5 MG/2ML nebulizer solution Take 0.5 mg by nebulization 2 (two) times daily.     Cyanocobalamin  1000 MCG TBCR Take 1 tablet by mouth daily.     diltiazem  (CARDIZEM  CD) 120 MG 24 hr capsule Take 1 capsule (120 mg total) by mouth daily.     ferrous sulfate  325 (65 FE) MG EC tablet Take 325 mg by mouth daily with breakfast.     folic acid  (FOLVITE ) 1 MG tablet Take 1 mg by mouth daily.     furosemide  (LASIX ) 40 MG tablet Take 1 tablet (40 mg total) by mouth daily.     gabapentin  (NEURONTIN ) 400 MG capsule Take 400 mg by mouth 3 (three) times daily.     hydroxychloroquine  (PLAQUENIL ) 200 MG tablet Take 200 mg by mouth daily.     ipratropium-albuterol  (DUONEB) 0.5-2.5 (3) MG/3ML SOLN Take 3 mLs by nebulization every 6 (six) hours.     levothyroxine  (SYNTHROID ) 150 MCG tablet Take 150 mcg by mouth daily before breakfast.     melatonin 5 MG TABS Take 5 mg by mouth at bedtime.     montelukast  (SINGULAIR ) 10 MG tablet Take 10 mg by mouth daily.     Multiple Vitamin (MULTIVITAMIN) tablet Take 1 tablet by mouth daily.     naloxone (NARCAN) nasal spray 4 mg/0.1 mL Place 1 spray into the nose 3 (three) times daily as needed (symptoms of opiod overdose).     oxymetazoline  (AFRIN) 0.05 % nasal spray Place 1 spray into both nostrils 2 (two) times daily.     pantoprazole  (PROTONIX ) 40 MG tablet Take 40 mg by mouth daily.     predniSONE  (DELTASONE ) 5 MG tablet Take 5 mg by mouth daily with breakfast.     rivaroxaban  (XARELTO ) 20 MG TABS tablet Take 20 mg by mouth daily.     sennosides-docusate sodium  (SENOKOT-S) 8.6-50 MG tablet Take 2 tablets by mouth 2 (two) times daily.     sertraline  (ZOLOFT ) 25 MG tablet Take 3 tablets (75 mg total) by mouth daily.     traMADol  HCl 25 MG TABS Take by mouth. PRN     No  current facility-administered medications for this visit.    Allergies  Allergen Reactions   Latex Hives, Itching and Dermatitis    Blisters (also)  Skin blisters   Oxycodone -Acetaminophen  Nausea Only and Other (See Comments)    Violent Vomiting Violent Vomiting   Penicillins Anaphylaxis, Swelling and Rash    Has patient had a PCN reaction causing immediate rash, facial/tongue/throat swelling, SOB or lightheadedness with hypotension: Yes Has patient had a PCN reaction causing severe rash involving mucus membranes or skin necrosis: No Has patient had a PCN reaction that  required hospitalization No Has patient had a PCN reaction occurring within the last 10 years: No If all of the above answers are NO, then may proceed with Cephalosporin use.    Strawberry Extract Hives, Itching, Rash, Anaphylaxis, Dermatitis and Swelling   Oxycodone -Acetaminophen  Nausea And Vomiting   Tyloxapol Nausea And Vomiting   Adhesive [Tape] Rash    Patient prefers paper tape   Wound Dressing Adhesive Dermatitis      Social History   Socioeconomic History   Marital status: Divorced    Spouse name: Not on file   Number of children: 1   Years of education: Not on file   Highest education level: Some college, no degree  Occupational History   Occupation: Nutritional therapist: [employer]   Occupation: Retired  Tobacco Use   Smoking status: Former    Current packs/day: 0.00    Average packs/day: 1 pack/day for 40.0 years (40.0 ttl pk-yrs)    Types: Cigarettes    Start date: 39    Quit date: 2021    Years since quitting: 4.5   Smokeless tobacco: Never   Tobacco comments:    < 1/2 pack/day  Vaping Use   Vaping status: Never Used  Substance and Sexual Activity   Alcohol use: No   Drug use: No   Sexual activity: Never  Other Topics Concern   Not on file  Social History Narrative   Not on file   Social Drivers of Health   Financial Resource Strain: High Risk (05/24/2024)   Overall  Financial Resource Strain (CARDIA)    Difficulty of Paying Living Expenses: Very hard  Food Insecurity: No Food Insecurity (06/14/2024)   Hunger Vital Sign    Worried About Running Out of Food in the Last Year: Never true    Ran Out of Food in the Last Year: Never true  Transportation Needs: No Transportation Needs (06/14/2024)   PRAPARE - Administrator, Civil Service (Medical): No    Lack of Transportation (Non-Medical): No  Physical Activity: Not on file  Stress: Not on file  Social Connections: Socially Isolated (06/14/2024)   Social Connection and Isolation Panel    Frequency of Communication with Friends and Family: More than three times a week    Frequency of Social Gatherings with Friends and Family: More than three times a week    Attends Religious Services: Never    Database administrator or Organizations: No    Attends Banker Meetings: Never    Marital Status: Divorced  Catering manager Violence: Not At Risk (06/14/2024)   Humiliation, Afraid, Rape, and Kick questionnaire    Fear of Current or Ex-Partner: No    Emotionally Abused: No    Physically Abused: No    Sexually Abused: No      Family History  Problem Relation Age of Onset   Diabetes Mother    Kidney disease Mother    Lung cancer Father    Alzheimer's disease Maternal Grandmother    Stroke Maternal Grandfather    Heart disease Paternal Grandmother    Unexplained death Paternal Grandfather        PHYSICAL EXAM: General:  Well appearing. No respiratory difficulty HEENT: normal Neck: supple. no JVD. Carotids 2+ bilat; no bruits. No lymphadenopathy or thyromegaly appreciated. Cor: PMI nondisplaced. Regular rate & rhythm. No rubs, gallops or murmurs. Lungs: clear Abdomen: soft, nontender, nondistended. No hepatosplenomegaly. No bruits or masses. Good bowel sounds. Extremities: no cyanosis,  clubbing, rash, edema Neuro: alert & oriented x 3, cranial nerves grossly intact. moves all 4  extremities w/o difficulty. Affect pleasant.  ECG:   ASSESSMENT & PLAN:  1: Chronic heart failure with preserved ejection fraction- - suspect due to - NYHA [name] - euvolemic - weighing daily -  - continue  - BNP  2: COPD- -  3: DM- - A1c  4: Atrial fibrillation- - EKG  5: Anemia-   Marilyn DELENA Class, FNP 07/04/24

## 2024-07-05 ENCOUNTER — Encounter: Payer: Self-pay | Admitting: Family

## 2024-07-05 ENCOUNTER — Ambulatory Visit: Attending: Family | Admitting: Family

## 2024-07-05 VITALS — BP 102/60 | HR 66

## 2024-07-05 DIAGNOSIS — Z79899 Other long term (current) drug therapy: Secondary | ICD-10-CM | POA: Insufficient documentation

## 2024-07-05 DIAGNOSIS — Z87891 Personal history of nicotine dependence: Secondary | ICD-10-CM | POA: Insufficient documentation

## 2024-07-05 DIAGNOSIS — I5032 Chronic diastolic (congestive) heart failure: Secondary | ICD-10-CM | POA: Diagnosis not present

## 2024-07-05 DIAGNOSIS — Z9981 Dependence on supplemental oxygen: Secondary | ICD-10-CM | POA: Diagnosis not present

## 2024-07-05 DIAGNOSIS — E039 Hypothyroidism, unspecified: Secondary | ICD-10-CM | POA: Diagnosis not present

## 2024-07-05 DIAGNOSIS — E785 Hyperlipidemia, unspecified: Secondary | ICD-10-CM | POA: Insufficient documentation

## 2024-07-05 DIAGNOSIS — Z8679 Personal history of other diseases of the circulatory system: Secondary | ICD-10-CM | POA: Insufficient documentation

## 2024-07-05 DIAGNOSIS — I440 Atrioventricular block, first degree: Secondary | ICD-10-CM | POA: Diagnosis not present

## 2024-07-05 DIAGNOSIS — I428 Other cardiomyopathies: Secondary | ICD-10-CM | POA: Insufficient documentation

## 2024-07-05 DIAGNOSIS — J449 Chronic obstructive pulmonary disease, unspecified: Secondary | ICD-10-CM | POA: Insufficient documentation

## 2024-07-05 DIAGNOSIS — E669 Obesity, unspecified: Secondary | ICD-10-CM | POA: Insufficient documentation

## 2024-07-05 DIAGNOSIS — I10 Essential (primary) hypertension: Secondary | ICD-10-CM

## 2024-07-05 DIAGNOSIS — M069 Rheumatoid arthritis, unspecified: Secondary | ICD-10-CM | POA: Insufficient documentation

## 2024-07-05 DIAGNOSIS — N1831 Chronic kidney disease, stage 3a: Secondary | ICD-10-CM

## 2024-07-05 DIAGNOSIS — D631 Anemia in chronic kidney disease: Secondary | ICD-10-CM

## 2024-07-05 DIAGNOSIS — D649 Anemia, unspecified: Secondary | ICD-10-CM | POA: Insufficient documentation

## 2024-07-05 DIAGNOSIS — J439 Emphysema, unspecified: Secondary | ICD-10-CM | POA: Diagnosis not present

## 2024-07-05 DIAGNOSIS — E1122 Type 2 diabetes mellitus with diabetic chronic kidney disease: Secondary | ICD-10-CM

## 2024-07-05 DIAGNOSIS — Z7901 Long term (current) use of anticoagulants: Secondary | ICD-10-CM | POA: Diagnosis not present

## 2024-07-05 DIAGNOSIS — E782 Mixed hyperlipidemia: Secondary | ICD-10-CM

## 2024-07-05 DIAGNOSIS — J90 Pleural effusion, not elsewhere classified: Secondary | ICD-10-CM | POA: Insufficient documentation

## 2024-07-05 DIAGNOSIS — I4819 Other persistent atrial fibrillation: Secondary | ICD-10-CM

## 2024-07-05 DIAGNOSIS — J9611 Chronic respiratory failure with hypoxia: Secondary | ICD-10-CM | POA: Insufficient documentation

## 2024-07-05 DIAGNOSIS — I110 Hypertensive heart disease with heart failure: Secondary | ICD-10-CM | POA: Diagnosis not present

## 2024-07-05 DIAGNOSIS — E119 Type 2 diabetes mellitus without complications: Secondary | ICD-10-CM | POA: Diagnosis not present

## 2024-07-05 LAB — LAB REPORT - SCANNED: EGFR: 46

## 2024-07-05 NOTE — Patient Instructions (Signed)
   It was a pleasure to meet you today.  

## 2024-07-07 NOTE — Progress Notes (Unsigned)
 Patient ID: [name], female   DOB: 1956-04-22, 68 y.o.   MRN: [id number]  Chief Complaint: Left leg wound.  History of Present Illness [name] is a 67 y.o. female with a recent inpatient visit to follow-up some pain involving an area where she had had a traumatic injury and sustained a hematoma involving the left anterior leg.  There is a bit of a raw area we treated with Xeroform, and wrapped her in a compressive wrap and advised continued elevation above the heart to help her with the swelling and the pain.  She was subsequently transferred out of the hospital pretty soon after and restored to her skilled nursing facility.  She presents today: Ready to have her sutures removed.  All the sutures are ready to come out.  There is a raw area that has regressed some.  No evidence of active infection or malodorous drainage.  She has the typical sporadic times of throbbing pain which I think may be due to times when her leg is not elevated.  [facility] has been good at keeping up with her daily dressing changes and leg wrapping.  I believe she will benefit from referral to regional wound care center, at [facility].  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    HTN (hypertension)    Hypercholesteremia    Hypothyroid    Rheumatoid arthritis(714.0)    Varicose vein of leg       Past Surgical History:  Procedure Laterality Date   APPENDECTOMY     CHOLECYSTECTOMY     ELBOW SURGERY     LEFT   ENDOSCOPIC VEIN LASER TREATMENT     RIGHT LEG   EXPLORATORY LAPAROTOMY     HAND SURGERY     RIGHT    HEEL SPUR SURGERY     BILAT   INSERTION OF MESH N/A 08/04/2017   Procedure: INSERTION OF MESH;  Surgeon: Eletha Boas, MD;  Location: [facility] OR;  Service: General;  Laterality: N/A;   partial  thyroidectomy  2015   TONSILLECTOMY     TUBAL LIGATION     VENTRAL HERNIA REPAIR N/A 08/04/2017   Procedure: LAPAROSCOPIC VENTRAL INCISIONAL HERNIA REPAIR WITH MESH;  Surgeon: Eletha Boas, MD;  Location: [facility] OR;  Service: General;  Laterality: N/A;    Allergies  Allergen Reactions   Latex Hives, Itching and Dermatitis    Blisters (also)  Skin blisters   Oxycodone -Acetaminophen  Nausea Only and Other (See Comments)    Violent Vomiting Violent Vomiting   Penicillins Anaphylaxis, Swelling and Rash    Has patient had a PCN reaction causing immediate rash, facial/tongue/throat swelling, SOB or lightheadedness with hypotension: Yes Has patient had a PCN reaction causing severe rash involving mucus membranes or skin necrosis: No Has patient had a PCN reaction that required hospitalization No Has patient had a PCN reaction occurring within the last 10 years: No If all of the above answers are NO, then may proceed with Cephalosporin use.    Strawberry Extract Hives, Itching, Rash, Anaphylaxis, Dermatitis and Swelling   Oxycodone -Acetaminophen  Nausea And Vomiting   Tyloxapol Nausea And Vomiting   Adhesive [Tape] Rash    Patient prefers paper tape   Wound Dressing Adhesive Dermatitis  Current Outpatient Medications  Medication Sig Dispense Refill   acetaminophen  (TYLENOL ) 500 MG tablet Take 500 mg by mouth every 8 (eight) hours as needed for mild pain (pain score 1-3).     atorvastatin  (LIPITOR) 10 MG tablet Take 10 mg by mouth at bedtime.     benzonatate (TESSALON) 100 MG capsule Take 100 mg by mouth every 8 (eight) hours as needed for cough.     budesonide  (PULMICORT ) 0.5 MG/2ML nebulizer solution Take 0.5 mg by nebulization 2 (two) times daily.     Cyanocobalamin  1000 MCG TBCR Take 1 tablet by mouth daily.     diltiazem  (CARDIZEM  CD) 120 MG 24 hr capsule Take 1 capsule (120 mg total) by mouth daily.     ferrous sulfate  325 (65 FE) MG EC tablet Take 325 mg by mouth daily with  breakfast.     folic acid  (FOLVITE ) 1 MG tablet Take 1 mg by mouth daily.     furosemide  (LASIX ) 40 MG tablet Take 1 tablet (40 mg total) by mouth daily.     gabapentin  (NEURONTIN ) 400 MG capsule Take 400 mg by mouth 3 (three) times daily.     hydroxychloroquine  (PLAQUENIL ) 200 MG tablet Take 200 mg by mouth daily.     ipratropium-albuterol  (DUONEB) 0.5-2.5 (3) MG/3ML SOLN Take 3 mLs by nebulization every 6 (six) hours.     levothyroxine  (SYNTHROID ) 150 MCG tablet Take 150 mcg by mouth daily before breakfast.     melatonin 5 MG TABS Take 5 mg by mouth at bedtime.     montelukast  (SINGULAIR ) 10 MG tablet Take 10 mg by mouth daily.     Multiple Vitamin (MULTIVITAMIN) tablet Take 1 tablet by mouth daily.     oxymetazoline  (AFRIN) 0.05 % nasal spray Place 1 spray into both nostrils 2 (two) times daily.     pantoprazole  (PROTONIX ) 40 MG tablet Take 40 mg by mouth daily.     predniSONE  (DELTASONE ) 5 MG tablet Take 5 mg by mouth daily with breakfast.     rivaroxaban  (XARELTO ) 20 MG TABS tablet Take 20 mg by mouth daily.     sennosides-docusate sodium  (SENOKOT-S) 8.6-50 MG tablet Take 2 tablets by mouth 2 (two) times daily.     sertraline  (ZOLOFT ) 25 MG tablet Take 3 tablets (75 mg total) by mouth daily.     traMADol  HCl 25 MG TABS Take by mouth. PRN     No current facility-administered medications for this visit.    Family History Family History  Problem Relation Age of Onset   Diabetes Mother    Kidney disease Mother    Lung cancer Father    Alzheimer's disease Maternal Grandmother    Stroke Maternal Grandfather    Heart disease Paternal Grandmother    Unexplained death Paternal Grandfather       Social History Social History   Tobacco Use   Smoking status: Former    Current packs/day: 0.00    Average packs/day: 1 pack/day for 40.0 years (40.0 ttl pk-yrs)    Types: Cigarettes    Start date: 35    Quit date: 2021    Years since quitting: 4.5   Smokeless tobacco: Never    Tobacco comments:    < 1/2 pack/day  Vaping Use   Vaping status: Never Used  Substance Use Topics   Alcohol use: No   Drug use: No        Physical Exam Blood pressure 105/70, pulse 75, temperature 98.6 F (37 C), temperature source Oral,  height 5' 2 (1.575 m), weight 243 lb (110.2 kg), SpO2 95%. Last Weight  Most recent update: 07/08/2024  3:24 PM    Weight  110.2 kg (243 lb)             CONSTITUTIONAL: Well developed, frail-appearing and adequately nourished, appropriately responsive and aware without distress.  Presenting in wheelchair and on supplemental O2. EYES: Sclera non-icteric.   EARS, NOSE, MOUTH AND THROAT:  The oropharynx is clear. Oral mucosa is pink and moist.    Hearing is intact to voice.  RESPIRATORY:  Normal respiratory effort without pathologic use of accessory muscles. CARDIOVASCULAR: Well perfused.  GI: The abdomen is  soft, nontender, and nondistended.  MUSCULOSKELETAL:  Symmetrical muscle tone appreciated in all four extremities.    SKIN: Skin turgor is normal. No pathologic skin lesions appreciated.  Open anterior proximal left shin wound, approximately 7 cm in length, 2-1/2 to 3 cm in width, with depth extending to 2-1/2 to 3 cm as well.  I proceeded with topical application of Xylocaine  jelly, allowed time for anesthetic effect and proceeded with excisional debridement of skin and subcutaneous tissues with a large bone curette.  I obtained a fair amount of hematoma and scooped out the gelatinous substance as tolerated and to a distance depth I was comfortable with.  I also proceeded with excisional debridement of the nonviable soft tissues adjacent along the perimeter of the wound.  Removing any retained or discolored eschar.  We then irrigated the wound with normal saline solution.  I reapplied a Xeroform gauze as a cover dressing.  ABD pad to top of this and then wrapped her leg with a Kerlix wrap, followed by an Ace wrap securing it.  We began the Ace wrap  at the foot, midfoot.  And proceeded to wrap it up to the knee.  An additional Ace wrap was applied to reach just below the knee and secure the dressing adequately.  She tolerated this procedure well. NEUROLOGIC:  Motor and sensation appear grossly normal.  Cranial nerves are grossly without defect. PSYCH:  Alert and oriented to person, place and time. Affect is appropriate for situation.  Data Reviewed I have personally reviewed what is currently available of the patient's imaging, recent labs and medical records.   Labs:     Latest Ref Rng & Units 06/30/2024    5:31 AM 06/28/2024    6:24 AM 06/25/2024    5:10 AM  CBC  WBC 4.0 - 10.5 K/uL  9.6    Hemoglobin 12.0 - 15.0 g/dL 9.4  9.1  9.6   Hematocrit 36.0 - 46.0 %  30.0    Platelets 150 - 400 K/uL  271        Latest Ref Rng & Units 06/28/2024    6:24 AM 06/27/2024    3:36 AM 06/26/2024    6:01 AM  CMP  Glucose 70 - 99 mg/dL 97  85  88   BUN 8 - 23 mg/dL 50  47  50   Creatinine 0.44 - 1.00 mg/dL 8.83  9.09  8.98   Sodium 135 - 145 mmol/L 135  134  135   Potassium 3.5 - 5.1 mmol/L 3.1  3.6  3.7   Chloride 98 - 111 mmol/L 90  87  86   CO2 22 - 32 mmol/L 35  36  40   Calcium  8.9 - 10.3 mg/dL 8.8  9.2  9.7    Imaging: Radiological images personally reviewed:  CLINICAL DATA:  Fall  with left leg injury. Question fluid collection. Worsening edema.   EXAM: ULTRASOUND LEFT LOWER EXTREMITY LIMITED   TECHNIQUE: Ultrasound examination of the lower extremity soft tissues was performed in the area of clinical concern.   COMPARISON:  None Available.   FINDINGS: There is a complex fluid collection noted in the anterior superficial soft tissues in the left lower leg in the area of swelling measuring 11.7 x 10 x 2.5 cm. This likely represents liquified hematoma.   IMPRESSION: Complex fluid collection in the subcutaneous soft tissues, likely liquefied hematoma.     Electronically Signed   By: Franky Crease M.D.   On: 06/29/2024  16:01 Within last 24 hrs: No results found.  Assessment    Open wound to the left anterior leg. Patient Active Problem List   Diagnosis Date Noted   Traumatic hematoma of left lower leg 06/30/2024   Syncope 06/24/2024   Anemia 06/23/2024   Acute hypoxic on chronic hypercapnic respiratory failure (HCC) 06/14/2024   ABLA (acute blood loss anemia) 06/14/2024   Laceration of left lower leg 06/14/2024   Fall 06/14/2024   Hypercapnic respiratory failure (HCC) 05/31/2024   Acute hypoxic respiratory failure (HCC) 05/30/2024   History of pulmonary embolism 05/21/2024   Obesity, Class III, BMI 40-49.9 (morbid obesity) 05/21/2024   Atrial fibrillation (HCC) 05/21/2024   Chronic anticoagulation 05/21/2024   Acute on chronic respiratory failure with hypoxia (HCC) 05/21/2024   Epistaxis 05/21/2024   History of DVT (deep vein thrombosis) 05/21/2024   CHF exacerbation (HCC) 05/21/2024   Acute on chronic heart failure with preserved ejection fraction (HFpEF) (HCC) 05/20/2024   Type 2 diabetes mellitus without complication, without long-term current use of insulin  (HCC) 05/28/2023   OSA on CPAP 05/29/2022   COVID-19 01/05/2021   Hypoxia    Chronic obstructive pulmonary disease (HCC)    Mitral valve annular calcification 04/07/2018   Depression 04/07/2018   Diastolic heart failure (HCC) 02/10/2018   Family history of hemophilia B 12/02/2017   Healthcare maintenance 05/07/2017   Onychomycosis 05/07/2017   Tricompartment osteoarthritis of both knees 02/19/2017   Hyperlipidemia 11/14/2016   AKI (acute kidney injury) (HCC) 04/23/2015   Rheumatoid arthritis (HCC) 07/08/2008   Morbid obesity (HCC) 05/04/2008   Essential hypertension 05/04/2008   ALLERGIC RHINITIS 05/04/2008   GERD 05/04/2008   VARICOSE VEINS LOWER EXTREMITIES W/INFLAMMATION 11/02/2007   Hypothyroidism 08/18/2007    Plan    Will continue her current dressing changes as prescribed. Continue to elevate her leg at all times  above her heart. I believe she will be best managed at a regional wound care center, likely here at [facility].  I will be happy to see her in the interim should she be able to be seen there.  Ideally would irrigate this wound with Vashe or sterile normal saline solution or a dilute hydrogen peroxide as available   I personally spent a total of 45 minutes in the care of the patient today including preparing to see the patient, performing a medically appropriate exam/evaluation, counseling and educating, and documenting clinical information in the EHR.   These notes generated with voice recognition software. I apologize for typographical errors.  Honor Leghorn M.D., FACS 07/08/2024, 4:04 PM

## 2024-07-08 ENCOUNTER — Encounter: Payer: Self-pay | Admitting: Surgery

## 2024-07-08 ENCOUNTER — Ambulatory Visit (INDEPENDENT_AMBULATORY_CARE_PROVIDER_SITE_OTHER): Admitting: Surgery

## 2024-07-08 VITALS — BP 105/70 | HR 75 | Temp 98.6°F | Ht 62.0 in | Wt 243.0 lb

## 2024-07-08 DIAGNOSIS — S8012XD Contusion of left lower leg, subsequent encounter: Secondary | ICD-10-CM

## 2024-07-08 DIAGNOSIS — S8012XA Contusion of left lower leg, initial encounter: Secondary | ICD-10-CM

## 2024-07-08 DIAGNOSIS — S81802D Unspecified open wound, left lower leg, subsequent encounter: Secondary | ICD-10-CM

## 2024-07-08 NOTE — Patient Instructions (Addendum)
 Continue changing the dressing daily  Physician orders sent with patient   We referred you to [facility] wound care center, they should be in contact with you. If you do not hear from them, you can call them at [phone]

## 2024-07-26 ENCOUNTER — Telehealth: Payer: Self-pay | Admitting: Family

## 2024-07-26 NOTE — Progress Notes (Signed)
 [facility] Note   Referring Physician: admission PCP: [name] DASEN, PA-C Cardiologist: None   Chief Complaint: shortness of breath   HPI:  Ms Locher is a 68 y/o female with a history of COPD, anemia, RA, obesity, CHF, PE, HTN, T2DM, hypothyroidism, DVT, chronic hypoxic respiratory failure, varicose veins and afib. Cardioverted 05/25.   Admitted 05/30/24 with hypoxic respiratory failure with saturations in the 50s. On admission, BNP was 750.8, HS-troponin was 18, ferritin 127, TSAT 16, and lactic acid was 1.7. Chest x-ray noted pulmonary vascular congestion, edema vs infection, and small bilateral pleural effusions. Echo 05/31/24: LVEF of 55-60%, grade II diastolic dysfunction, moderate MR. Home bipap broken per patient. Found to be hypotensive but fluid responsive after 500 cc NS. Placed on bipap with improvement of symptoms.   Admitted 06/14/24 with a change in mental status noted by nursing home staff. Found to have acute on chronic hypoxic and hypercapnic respiratory failure, likely secondary to nonfunctional BiPAP at facility. Patient responded well with a proper BiPAP, initially required 10 L of oxygen but slowly weaned back to 6 L. IV diuresed. Had a large laceration of left leg secondary to the fall at facility, sutures were placed initially with ED provider. She completed a 7-day course of Bactrim  and no recent sign of infection. Continue to have significant pain and required premedications for dressing change. Due to little worsening of swelling just below the suture line a ultrasound was obtained which shows concern of liquefying hematoma, general surgery was consulted and they removed the sutures on 06/30/2024 with intention for spontaneous drainage. Received 3 units PRBC's for anemia. Has had continued issues with bipap at SNF.   She presents today for her initial HF visit with a chief complaint of shortness of breath. Has associated fatigue, occasional chest  aching, occasional palpitations, dizziness, bilateral pedal edema, left lower leg wrapped. Has just started PT at [facility].   Wearing 6L oxygen around the clock along with bipap nightly. While talking with patient in the office, her oxygen tank ran out.   Pertinent cardiac history: Echo 06/2017 with LVEF of 60-65%, mild LVH, grade I diastolic dysfunction. Echo 02/2018 noted LVEF of 60-65%, moderate mid & basal hypertrophy, and severe mitral calcification. Most recent echo performed by [facility] on 04/26/24 shwoed LVEF of 55-60%, mildly reduced RV function, mild PH, mild to mdoerate MR, and indeterminate diastolic function.   Review of Systems: [y] = yes, [ ]  = no   General: Weight gain [ ] ; Weight loss [ ] ; Anorexia [ ] ; Fatigue Davis.Dad ]; Fever [ ] ; Chills [ ] ; Weakness [ ]   Cardiac: Chest pain/pressure [ ] ; Resting SOB [ ] ; Exertional SOB Davis.Dad ]; Orthopnea [ ] ; Pedal Edema Davis.Dad ]; Palpitations Davis.Dad ]; Syncope [ ] ; Presyncope [ ] ; Paroxysmal nocturnal dyspnea[ ]   Pulmonary: Cough [ ] ; Wheezing[ ] ; Hemoptysis[ ] ; Sputum [ ] ; Snoring [ ]   GI: Vomiting[ ] ; Dysphagia[ ] ; Melena[ ] ; Hematochezia [ ] ; Heartburn[ ] ; Abdominal pain [ ] ; Constipation [ ] ; Diarrhea [ ] ; BRBPR [ ]   GU: Hematuria[ ] ; Dysuria [ ] ; Nocturia[ ]   Vascular: Pain in legs with walking [ ] ; Pain in feet with lying flat [ ] ; Non-healing sores [ ] ; Stroke [ ] ; TIA [ ] ; Slurred speech [ ] ;  Neuro: Dizziness[y ]; Vertigo[ ] ; Seizures[ ] ; Paresthesias[ ] ;Blurred vision [ ] ; Diplopia [ ] ; Vision changes [ ]   Ortho/Skin: Arthritis [ ] ; Joint pain [ ] ; Muscle pain [ ] ; Joint swelling [ ] ;  by mouth daily.     diltiazem  (CARDIZEM  CD) 120 MG 24 hr capsule Take 1 capsule (120 mg total) by mouth daily.     ferrous sulfate  325 (65 FE) MG EC tablet Take 325 mg by mouth daily with breakfast.     folic acid  (FOLVITE ) 1 MG tablet Take 1 mg by mouth daily.     furosemide  (LASIX ) 40 MG tablet Take 1 tablet (40 mg total) by mouth daily.     gabapentin  (NEURONTIN ) 400 MG capsule Take  400 mg by mouth 3 (three) times daily.     hydroxychloroquine  (PLAQUENIL ) 200 MG tablet Take 200 mg by mouth daily.     ipratropium-albuterol  (DUONEB) 0.5-2.5 (3) MG/3ML SOLN Take 3 mLs by nebulization every 6 (six) hours.     levothyroxine  (SYNTHROID ) 150 MCG tablet Take 150 mcg by mouth daily before breakfast.     melatonin 5 MG TABS Take 5 mg by mouth at bedtime.     montelukast  (SINGULAIR ) 10 MG tablet Take 10 mg by mouth daily.     Multiple Vitamin (MULTIVITAMIN) tablet Take 1 tablet by mouth daily.     oxymetazoline  (AFRIN) 0.05 % nasal spray Place 1 spray into both nostrils 2 (two) times daily.     pantoprazole  (PROTONIX ) 40 MG tablet Take 40 mg by mouth daily.     predniSONE  (DELTASONE ) 5 MG tablet Take 5 mg by mouth daily with breakfast.     rivaroxaban  (XARELTO ) 20 MG TABS tablet Take 20 mg by mouth daily.     sennosides-docusate sodium  (SENOKOT-S) 8.6-50 MG tablet Take 2 tablets by mouth 2 (two) times daily.     sertraline  (ZOLOFT ) 25 MG tablet Take 3 tablets (75 mg total) by mouth daily.     traMADol  HCl 25 MG TABS Take by mouth. PRN     No current facility-administered medications for this visit.    Allergies  Allergen Reactions   Latex Hives, Itching and Dermatitis    Blisters (also)  Skin blisters   Oxycodone -Acetaminophen  Nausea Only and Other (See Comments)    Violent Vomiting Violent Vomiting   Penicillins Anaphylaxis, Swelling and Rash    Has patient had a PCN reaction causing immediate rash, facial/tongue/throat swelling, SOB or lightheadedness with hypotension: Yes Has patient had a PCN reaction causing severe rash involving mucus membranes or skin necrosis: No Has patient had a PCN reaction that required hospitalization No Has patient had a PCN reaction occurring within the last 10 years: No If all of the above answers are NO, then may proceed with Cephalosporin use.    Strawberry Extract Hives, Itching, Rash, Anaphylaxis, Dermatitis and Swelling    Oxycodone -Acetaminophen  Nausea And Vomiting   Tyloxapol Nausea And Vomiting   Adhesive [Tape] Rash    Patient prefers paper tape   Wound Dressing Adhesive Dermatitis      Social History   Socioeconomic History   Marital status: Divorced    Spouse name: Not on file   Number of children: 1   Years of education: Not on file   Highest education level: Some college, no degree  Occupational History   Occupation: Nutritional therapist: [employer]   Occupation: Retired  Tobacco Use   Smoking status: Former    Current packs/day: 0.00    Average packs/day: 1 pack/day for 40.0 years (40.0 ttl pk-yrs)    Types: Cigarettes    Start date: 99    Quit date: 2021    Years since quitting: 4.6  hypotension: Yes Has patient had a PCN reaction causing severe rash involving mucus membranes or skin necrosis: No Has patient had a PCN reaction that required hospitalization No Has patient had a PCN reaction occurring within the last 10 years: No If all of the above answers are NO, then may proceed with Cephalosporin use.    Strawberry Extract Hives, Itching, Rash, Anaphylaxis, Dermatitis and Swelling   Oxycodone -Acetaminophen  Nausea And Vomiting   Tyloxapol Nausea And Vomiting   Adhesive [Tape] Rash    Patient prefers paper tape   Wound Dressing Adhesive Dermatitis      Social History   Socioeconomic History   Marital status: Divorced    Spouse name: Not on file   Number of children: 1   Years of education: Not on file   Highest education level: Some college, no degree  Occupational History   Occupation: Nutritional therapist: [employer]   Occupation: Retired  Tobacco Use   Smoking status: Former    Current packs/day: 0.00    Average packs/day: 1 pack/day for 40.0 years (40.0 ttl pk-yrs)    Types: Cigarettes    Start date: 65    Quit date: 2021    Years since quitting: 4.6   Smokeless tobacco: Never   Tobacco comments:    < 1/2 pack/day  Vaping Use   Vaping status: Never Used  Substance and Sexual Activity   Alcohol use: No    Drug use: No   Sexual activity: Never  Other Topics Concern   Not on file  Social History Narrative   Not on file   Social Drivers of Health   Financial Resource Strain: High Risk (05/24/2024)   Overall Financial Resource Strain (CARDIA)    Difficulty of Paying Living Expenses: Very hard  Food Insecurity: No Food Insecurity (06/14/2024)   Hunger Vital Sign    Worried About Running Out of Food in the Last Year: Never true    Ran Out of Food in the Last Year: Never true  Transportation Needs: No Transportation Needs (06/14/2024)   PRAPARE - Administrator, Civil Service (Medical): No    Lack of Transportation (Non-Medical): No  Physical Activity: Not on file  Stress: Not on file  Social Connections: Socially Isolated (06/14/2024)   Social Connection and Isolation Panel    Frequency of Communication with Friends and Family: More than three times a week    Frequency of Social Gatherings with Friends and Family: More than three times a week    Attends Religious Services: Never    Database administrator or Organizations: No    Attends Banker Meetings: Never    Marital Status: Divorced  Catering manager Violence: Not At Risk (06/14/2024)   Humiliation, Afraid, Rape, and Kick questionnaire    Fear of Current or Ex-Partner: No    Emotionally Abused: No    Physically Abused: No    Sexually Abused: No      Family History  Problem Relation Age of Onset   Diabetes Mother    Kidney disease Mother    Lung cancer Father    Alzheimer's disease Maternal Grandmother    Stroke Maternal Grandfather    Heart disease Paternal Grandmother    Unexplained death Paternal Grandfather    There were no vitals filed for this visit.  Wt Readings from Last 3 Encounters:  07/08/24 243 lb (110.2 kg)  06/23/24 217 lb 2.5 oz (98.5 kg)  06/13/24 250 lb (113.4  kg)   Lab Results  Component Value Date   CREATININE 1.16 (H) 06/28/2024   CREATININE 0.90 06/27/2024   CREATININE  1.01 (H) 06/26/2024    PHYSICAL EXAM:  General: Well appearing, obese female in wheelchair  Cor: No JVD. Regular rhythm, rate.  Lungs: clear Abdomen: soft, nontender, nondistended. Extremities: pitting edema bilateral lower legs with left leg wrapped Neuro:. Affect pleasant   ECG: NSR with 1st degree AV block, HR 66 (personally reviewed)   ASSESSMENT & PLAN:  1: NICM with preserved ejection fraction- - suspect due to persistent AF, COPD - NYHA class III - euvolemic - order written for SNF to weigh daily with parameters to call about - Echo 05/31/24: LVEF of 55-60%, grade II diastolic dysfunction, moderate MR - continue furosemide  40mg  daily - discussed adding SGLT2 but she's currently wearing depends and using bedside commode. Hope to add it at next visit if mobility increases - doubt BP could tolerate MRA - BNP 06/14/24 was 163.5  2: COPD- - wearing bipap nightly - wearing oxygen at 6L around the clock. Placed on our tank while in the office and that tank went empty so she was placed on a 2nd tank. Facility driver brought a full tank with him when picking her up  3: DM- - A1c 05/30/24 was 4.9% - facility glucose log ranges from 87-191  4: Atrial fibrillation- - EKG- NSR with 1st degree AV block - continue diltiazem  120mg  daily - continue xarelto  20mg  daily  5: HTN- - BP 102/60 - currently PCP at SNF - BMET 06/28/24 reviewed: sodium 135, potassium 3.1, creatinine 1.16 & GFR 52 - had labs done at SNF earlier today. Will have staff call and ask for results to be faxed to us .   6: Anemia- - Hg 06/30/24 was 9.4 - continue iron  7: Hyperlipidemia- - LDL 12/01/16 was 142 - continue atorvastatin  10mg  daily - will get lab results from facility and will check lipid panel next time if not done   Return in 2 weeks, sooner if needed.   Ellouise DELENA Class, FNP 07/26/24

## 2024-07-26 NOTE — Telephone Encounter (Signed)
 Called to confirm/remind patient of their appointment at the [facility] on 07/27/24.   Appointment:   [] Confirmed  [] Left mess   [x] No answer/No voice mail  [] VM Full/unable to leave message  [] Phone not in service  Patient reminded to bring all medications and/or complete list.  Confirmed patient has transportation. Gave directions, instructed to utilize valet parking.

## 2024-07-27 ENCOUNTER — Telehealth: Payer: Self-pay | Admitting: Family

## 2024-07-27 ENCOUNTER — Encounter: Payer: Self-pay | Admitting: Family

## 2024-07-27 ENCOUNTER — Ambulatory Visit: Attending: Family | Admitting: Family

## 2024-07-27 VITALS — BP 104/59 | HR 85 | Wt 246.1 lb

## 2024-07-27 DIAGNOSIS — J90 Pleural effusion, not elsewhere classified: Secondary | ICD-10-CM | POA: Diagnosis not present

## 2024-07-27 DIAGNOSIS — R609 Edema, unspecified: Secondary | ICD-10-CM | POA: Insufficient documentation

## 2024-07-27 DIAGNOSIS — E1122 Type 2 diabetes mellitus with diabetic chronic kidney disease: Secondary | ICD-10-CM | POA: Diagnosis not present

## 2024-07-27 DIAGNOSIS — Z87891 Personal history of nicotine dependence: Secondary | ICD-10-CM | POA: Diagnosis not present

## 2024-07-27 DIAGNOSIS — D649 Anemia, unspecified: Secondary | ICD-10-CM | POA: Insufficient documentation

## 2024-07-27 DIAGNOSIS — I10 Essential (primary) hypertension: Secondary | ICD-10-CM

## 2024-07-27 DIAGNOSIS — Z9981 Dependence on supplemental oxygen: Secondary | ICD-10-CM | POA: Insufficient documentation

## 2024-07-27 DIAGNOSIS — E7800 Pure hypercholesterolemia, unspecified: Secondary | ICD-10-CM | POA: Diagnosis not present

## 2024-07-27 DIAGNOSIS — E669 Obesity, unspecified: Secondary | ICD-10-CM | POA: Diagnosis not present

## 2024-07-27 DIAGNOSIS — I509 Heart failure, unspecified: Secondary | ICD-10-CM | POA: Insufficient documentation

## 2024-07-27 DIAGNOSIS — I5032 Chronic diastolic (congestive) heart failure: Secondary | ICD-10-CM | POA: Diagnosis not present

## 2024-07-27 DIAGNOSIS — J9611 Chronic respiratory failure with hypoxia: Secondary | ICD-10-CM | POA: Diagnosis not present

## 2024-07-27 DIAGNOSIS — I428 Other cardiomyopathies: Secondary | ICD-10-CM | POA: Insufficient documentation

## 2024-07-27 DIAGNOSIS — J439 Emphysema, unspecified: Secondary | ICD-10-CM | POA: Diagnosis not present

## 2024-07-27 DIAGNOSIS — L03116 Cellulitis of left lower limb: Secondary | ICD-10-CM | POA: Diagnosis not present

## 2024-07-27 DIAGNOSIS — Z79899 Other long term (current) drug therapy: Secondary | ICD-10-CM | POA: Diagnosis not present

## 2024-07-27 DIAGNOSIS — I890 Lymphedema, not elsewhere classified: Secondary | ICD-10-CM | POA: Diagnosis not present

## 2024-07-27 DIAGNOSIS — D631 Anemia in chronic kidney disease: Secondary | ICD-10-CM

## 2024-07-27 DIAGNOSIS — M069 Rheumatoid arthritis, unspecified: Secondary | ICD-10-CM | POA: Diagnosis not present

## 2024-07-27 DIAGNOSIS — Z7901 Long term (current) use of anticoagulants: Secondary | ICD-10-CM | POA: Diagnosis not present

## 2024-07-27 DIAGNOSIS — I110 Hypertensive heart disease with heart failure: Secondary | ICD-10-CM | POA: Insufficient documentation

## 2024-07-27 DIAGNOSIS — I4819 Other persistent atrial fibrillation: Secondary | ICD-10-CM

## 2024-07-27 DIAGNOSIS — E782 Mixed hyperlipidemia: Secondary | ICD-10-CM

## 2024-07-27 DIAGNOSIS — E119 Type 2 diabetes mellitus without complications: Secondary | ICD-10-CM | POA: Insufficient documentation

## 2024-07-27 DIAGNOSIS — N1831 Chronic kidney disease, stage 3a: Secondary | ICD-10-CM

## 2024-07-27 DIAGNOSIS — E039 Hypothyroidism, unspecified: Secondary | ICD-10-CM | POA: Insufficient documentation

## 2024-07-27 MED ORDER — EMPAGLIFLOZIN 10 MG PO TABS: 10.0000 mg | ORAL_TABLET | Freq: Every day | ORAL | 3 refills | Status: AC

## 2024-07-27 NOTE — Telephone Encounter (Signed)
 Lab results from [facility] dated 07/05/24:  Glucose 87 Creatinine 1.28 GFR 46 Sodium 135 Potassium 3.9 Hg 8.6

## 2024-07-27 NOTE — Patient Instructions (Signed)
 Medication Changes:  Start Jardiance  10 MG once daily.   Special Instructions // Education:  Weight Daily and keep a record of it.   Follow-Up in: 1 month with Ellouise Class, FNP.   Thank you for choosing [facility].    At the [facility], you and your health needs are our priority. We have a designated team specialized in the treatment of Heart Failure. This Care Team includes your primary Heart Failure Specialized Cardiologist (physician), Advanced Practice Providers (APPs- Physician Assistants and Nurse Practitioners), and Pharmacist who all work together to provide you with the care you need, when you need it.   You may see any of the following providers on your designated Care Team at your next follow up:  Dr. Toribio Fuel Dr. Ezra Shuck Dr. Ria Commander Dr. Morene Brownie Ellouise Class, FNP Jaun Bash, RPH-CPP  Please be sure to bring in all your medications bottles to every appointment.   Need to Contact Us :  If you have any questions or concerns before your next appointment please send us  a message through [place] or call our office at [phone].    TO LEAVE A MESSAGE FOR THE NURSE SELECT OPTION 2, PLEASE LEAVE A MESSAGE INCLUDING: YOUR NAME DATE OF BIRTH CALL BACK NUMBER REASON FOR CALL**this is important as we prioritize the call backs  YOU WILL RECEIVE A CALL BACK THE SAME DAY AS LONG AS YOU CALL BEFORE 4:00 PM

## 2024-08-06 ENCOUNTER — Ambulatory Visit: Admitting: Physician Assistant

## 2024-08-31 ENCOUNTER — Telehealth: Payer: Self-pay | Admitting: Family

## 2024-08-31 ENCOUNTER — Encounter: Admitting: Family

## 2024-08-31 NOTE — Telephone Encounter (Signed)
 Patient did not show for her Heart Failure Clinic appointment on 08/31/24.

## 2024-08-31 NOTE — Progress Notes (Deleted)
 Advanced Heart Failure Clinic Note   Referring Physician: admission 06/25 PCP: [name] DASEN, PA-C Cardiologist: None   Chief Complaint: shortness of breath   HPI:  [name] is a 68 y/o female with a history of COPD, anemia, RA, obesity, CHF, PE, HTN, T2DM, hypothyroidism, DVT, chronic hypoxic respiratory failure, varicose veins and afib. Cardioverted 05/25.   Admitted 05/30/24 with hypoxic respiratory failure with saturations in the 50s. On admission, BNP was 750.8, HS-troponin was 18, ferritin 127, TSAT 16, and lactic acid was 1.7. Chest x-ray noted pulmonary vascular congestion, edema vs infection, and small bilateral pleural effusions. Echo 05/31/24: LVEF of 55-60%, grade II diastolic dysfunction, moderate MR. Home bipap broken per patient. Found to be hypotensive but fluid responsive after 500 cc NS. Placed on bipap with improvement of symptoms.   Admitted 06/14/24 with a change in mental status noted by nursing home staff. Found to have acute on chronic hypoxic and hypercapnic respiratory failure, likely secondary to nonfunctional BiPAP at facility. Patient responded well with a proper BiPAP, initially required 10 L of oxygen but slowly weaned back to 6 L. IV diuresed. Had a large laceration of left leg secondary to the fall at facility, sutures were placed initially with ED provider. She completed a 7-day course of Bactrim  and no recent sign of infection. Continue to have significant pain and required premedications for dressing change. Due to little worsening of swelling just below the suture line a ultrasound was obtained which shows concern of liquefying hematoma, general surgery was consulted and they removed the sutures on 06/30/2024 with intention for spontaneous drainage. Received 3 units PRBC's for anemia. Has had continued issues with bipap at SNF.   She presents today for a HF follow-up visit with a chief complaint of shortness of breath. Has associated fatigue, cellulitis left  lower leg, pedal edema. Denies chest pain, palpitations, dizziness. PT is working with her daily and she's slowly getting around with assistance.   Wearing 6L oxygen around the clock along with bipap nightly.   Pertinent cardiac history: Echo 06/2017 with LVEF of 60-65%, mild LVH, grade I diastolic dysfunction. Echo 02/2018 noted LVEF of 60-65%, moderate mid & basal hypertrophy, and severe mitral calcification. Most recent echo performed by [facility] on 04/26/24 shwoed LVEF of 55-60%, mildly reduced RV function, mild PH, mild to mdoerate MR, and indeterminate diastolic function.   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     atorvastatin  (LIPITOR) 10 MG tablet Take 10 mg by mouth at bedtime.     benzonatate (TESSALON) 100 MG capsule Take 100 mg by mouth every 8 (eight) hours as needed for cough.     budesonide  (PULMICORT ) 0.5 MG/2ML nebulizer solution Take 0.5 mg by nebulization 2 (two) times daily.     Cyanocobalamin  1000 MCG TBCR Take 1 tablet  by mouth daily.     diltiazem  (CARDIZEM  CD) 120 MG 24 hr capsule Take 1 capsule (120 mg total) by mouth daily.     doxycycline  (ADOXA) 100 MG tablet Take 100 mg by mouth 2 (two) times daily.     empagliflozin  (JARDIANCE ) 10 MG TABS tablet Take 1 tablet (10 mg total) by mouth daily before breakfast. 90 tablet 3   ferrous sulfate  325 (65 FE) MG EC tablet Take 325 mg by mouth daily with  breakfast.     folic acid  (FOLVITE ) 1 MG tablet Take 1 mg by mouth daily.     furosemide  (LASIX ) 40 MG tablet Take 1 tablet (40 mg total) by mouth daily.     gabapentin  (NEURONTIN ) 400 MG capsule Take 400 mg by mouth 3 (three) times daily.     hydroxychloroquine  (PLAQUENIL ) 200 MG tablet Take 200 mg by mouth daily.     ipratropium-albuterol  (DUONEB) 0.5-2.5 (3) MG/3ML SOLN Take 3 mLs by nebulization every 6 (six) hours.     levofloxacin  (LEVAQUIN ) 750 MG tablet Take 750 mg by mouth daily.     levothyroxine  (SYNTHROID ) 150 MCG tablet Take 150 mcg by mouth daily before breakfast.     melatonin 5 MG TABS Take 5 mg by mouth at bedtime.     montelukast  (SINGULAIR ) 10 MG tablet Take 10 mg by mouth daily.     Multiple Vitamin (MULTIVITAMIN) tablet Take 1 tablet by mouth daily.     oxymetazoline  (AFRIN) 0.05 % nasal spray Place 1 spray into both nostrils 2 (two) times daily.     pantoprazole  (PROTONIX ) 40 MG tablet Take 40 mg by mouth daily.     predniSONE  (DELTASONE ) 5 MG tablet Take 5 mg by mouth daily with breakfast.     rivaroxaban  (XARELTO ) 20 MG TABS tablet Take 20 mg by mouth daily.     sennosides-docusate sodium  (SENOKOT-S) 8.6-50 MG tablet Take 2 tablets by mouth 2 (two) times daily.     sertraline  (ZOLOFT ) 25 MG tablet Take 3 tablets (75 mg total) by mouth daily.     traMADol  HCl 25 MG TABS Take by mouth. PRN     No current facility-administered medications for this visit.    Allergies  Allergen Reactions   Latex Hives, Itching and Dermatitis    Blisters (also)  Skin blisters   Oxycodone -Acetaminophen  Nausea Only and Other (See Comments)    Violent Vomiting Violent Vomiting   Penicillins Anaphylaxis, Swelling and Rash    Has patient had a PCN reaction causing immediate rash, facial/tongue/throat swelling, SOB or lightheadedness with hypotension: Yes Has patient had a PCN reaction causing severe rash involving mucus membranes or skin necrosis: No Has patient had a PCN reaction that  required hospitalization No Has patient had a PCN reaction occurring within the last 10 years: No If all of the above answers are NO, then may proceed with Cephalosporin use.    Strawberry Extract Hives, Itching, Rash, Anaphylaxis, Dermatitis and Swelling   Oxycodone -Acetaminophen  Nausea And Vomiting   Tyloxapol Nausea And Vomiting   Adhesive [Tape] Rash    Patient prefers paper tape   Wound Dressing Adhesive Dermatitis      Social History   Socioeconomic History   Marital status: Divorced    Spouse name: Not on file   Number of children: 1   Years of education: Not on file   Highest education level: Some college, no degree  Occupational History   Occupation: Nutritional therapist: [employer]  Occupation: Retired  Tobacco Use   Smoking status: Former    Current packs/day: 0.00    Average packs/day: 1 pack/day for 40.0 years (40.0 ttl pk-yrs)    Types: Cigarettes    Start date: 63    Quit date: 2021    Years since quitting: 4.7   Smokeless tobacco: Never   Tobacco comments:    < 1/2 pack/day  Vaping Use   Vaping status: Never Used  Substance and Sexual Activity   Alcohol use: No   Drug use: No   Sexual activity: Never  Other Topics Concern   Not on file  Social History Narrative   Not on file   Social Drivers of Health   Financial Resource Strain: High Risk (05/24/2024)   Overall Financial Resource Strain (CARDIA)    Difficulty of Paying Living Expenses: Very hard  Food Insecurity: No Food Insecurity (06/14/2024)   Hunger Vital Sign    Worried About Running Out of Food in the Last Year: Never true    Ran Out of Food in the Last Year: Never true  Transportation Needs: No Transportation Needs (06/14/2024)   PRAPARE - Administrator, Civil Service (Medical): No    Lack of Transportation (Non-Medical): No  Physical Activity: Not on file  Stress: Not on file  Social Connections: Socially Isolated (06/14/2024)   Social Connection and Isolation  Panel    Frequency of Communication with Friends and Family: More than three times a week    Frequency of Social Gatherings with Friends and Family: More than three times a week    Attends Religious Services: Never    Database administrator or Organizations: No    Attends Banker Meetings: Never    Marital Status: Divorced  Catering manager Violence: Not At Risk (06/14/2024)   Humiliation, Afraid, Rape, and Kick questionnaire    Fear of Current or Ex-Partner: No    Emotionally Abused: No    Physically Abused: No    Sexually Abused: No      Family History  Problem Relation Age of Onset   Diabetes Mother    Kidney disease Mother    Lung cancer Father    Alzheimer's disease Maternal Grandmother    Stroke Maternal Grandfather    Heart disease Paternal Grandmother    Unexplained death Paternal Grandfather    There were no vitals filed for this visit.  Wt Readings from Last 3 Encounters:  07/27/24 246 lb 1.6 oz (111.6 kg)  07/08/24 243 lb (110.2 kg)  06/23/24 217 lb 2.5 oz (98.5 kg)   Lab Results  Component Value Date   CREATININE 1.16 (H) 06/28/2024   CREATININE 0.90 06/27/2024   CREATININE 1.01 (H) 06/26/2024    PHYSICAL EXAM:  General: Well appearing obese female in wheelchair.  Cor: No JVD. Regular rhythm, rate.  Lungs: clear Abdomen: soft, nontender, nondistended. Extremities: pitting edema bilateral lower legs. Left lower leg has bandage on it.  Neuro:. Affect pleasant   ECG:  not done   ASSESSMENT & PLAN:  1: NICM with preserved ejection fraction- - suspect due to persistent AF, COPD - NYHA [name] III - euvolemic - order written for SNF to weigh daily with parameters to call about. Facility weight chart shows daily weights but had 1 episode where 5 lb weight gain overnight but [facility] was not called about weight gain - Echo 05/31/24: LVEF of 55-60%, grade II diastolic dysfunction, moderate MR - continue furosemide  40mg  daily - begin jardiance   10mg   daily. She denies frequent UTI/ yeast infections. Does wear attends in case she has an accident but generally gets to the bathroom in time - BMET next visit - doubt BP could tolerate MRA - lymphedema present with left lower leg wound (per patient). Currently on antibiotics and receiving wound care at SNF - BNP 06/14/24 was 163.5  2: COPD- - wearing bipap nightly - wearing oxygen at 6L around the clock.   3: DM- - A1c 05/30/24 was 4.9% - facility glucose log ranges from low 100's-170's  4: Atrial fibrillation- - continue diltiazem  120mg  daily - continue xarelto  20mg  daily  5: HTN- - BP 104/59 - currently PCP at SNF - BMET 06/28/24 reviewed: sodium 135, potassium 3.1, creatinine 1.16 & GFR 52 - nurse was able to reach someone at SNF but not until the patient had left. She was told that patient hadn't had lab work in the last month. Order sent for BMET one day this week  6: Anemia- - Hg 06/30/24 was 9.4 - continue iron  7: Hyperlipidemia- - LDL 12/01/16 was 142 - continue atorvastatin  10mg  daily - will do lipid panel next visit when BMET rechecked   Return in 1 months, sooner if needed.   Marilyn DELENA Class, FNP 08/31/24

## 2024-09-11 ENCOUNTER — Emergency Department

## 2024-09-11 ENCOUNTER — Other Ambulatory Visit: Payer: Self-pay

## 2024-09-11 ENCOUNTER — Inpatient Hospital Stay
Admission: EM | Admit: 2024-09-11 | Discharge: 2024-09-14 | DRG: 291 | Disposition: A | Discharge status: Skilled Nursing Facility | Source: Skilled Nursing Facility | Attending: Internal Medicine | Admitting: Internal Medicine

## 2024-09-11 DIAGNOSIS — G4733 Obstructive sleep apnea (adult) (pediatric): Secondary | ICD-10-CM | POA: Diagnosis present

## 2024-09-11 DIAGNOSIS — I130 Hypertensive heart and chronic kidney disease with heart failure and stage 1 through stage 4 chronic kidney disease, or unspecified chronic kidney disease: Principal | ICD-10-CM | POA: Diagnosis present

## 2024-09-11 DIAGNOSIS — I509 Heart failure, unspecified: Principal | ICD-10-CM | POA: Diagnosis present

## 2024-09-11 DIAGNOSIS — E7800 Pure hypercholesterolemia, unspecified: Secondary | ICD-10-CM | POA: Diagnosis present

## 2024-09-11 DIAGNOSIS — J9621 Acute and chronic respiratory failure with hypoxia: Secondary | ICD-10-CM | POA: Diagnosis present

## 2024-09-11 DIAGNOSIS — J9622 Acute and chronic respiratory failure with hypercapnia: Secondary | ICD-10-CM | POA: Diagnosis present

## 2024-09-11 DIAGNOSIS — Z7901 Long term (current) use of anticoagulants: Secondary | ICD-10-CM

## 2024-09-11 DIAGNOSIS — I5033 Acute on chronic diastolic (congestive) heart failure: Secondary | ICD-10-CM | POA: Diagnosis present

## 2024-09-11 DIAGNOSIS — Z7952 Long term (current) use of systemic steroids: Secondary | ICD-10-CM

## 2024-09-11 DIAGNOSIS — E785 Hyperlipidemia, unspecified: Secondary | ICD-10-CM | POA: Diagnosis present

## 2024-09-11 DIAGNOSIS — R4182 Altered mental status, unspecified: Secondary | ICD-10-CM | POA: Diagnosis not present

## 2024-09-11 DIAGNOSIS — Z9981 Dependence on supplemental oxygen: Secondary | ICD-10-CM

## 2024-09-11 DIAGNOSIS — Z79899 Other long term (current) drug therapy: Secondary | ICD-10-CM

## 2024-09-11 DIAGNOSIS — D67 Hereditary factor IX deficiency: Secondary | ICD-10-CM | POA: Diagnosis present

## 2024-09-11 DIAGNOSIS — Z7984 Long term (current) use of oral hypoglycemic drugs: Secondary | ICD-10-CM

## 2024-09-11 DIAGNOSIS — E871 Hypo-osmolality and hyponatremia: Secondary | ICD-10-CM | POA: Diagnosis present

## 2024-09-11 DIAGNOSIS — N1831 Chronic kidney disease, stage 3a: Secondary | ICD-10-CM | POA: Diagnosis present

## 2024-09-11 DIAGNOSIS — Z7951 Long term (current) use of inhaled steroids: Secondary | ICD-10-CM

## 2024-09-11 DIAGNOSIS — J9601 Acute respiratory failure with hypoxia: Secondary | ICD-10-CM | POA: Diagnosis present

## 2024-09-11 DIAGNOSIS — E890 Postprocedural hypothyroidism: Secondary | ICD-10-CM | POA: Diagnosis present

## 2024-09-11 DIAGNOSIS — R0602 Shortness of breath: Secondary | ICD-10-CM

## 2024-09-11 DIAGNOSIS — E039 Hypothyroidism, unspecified: Secondary | ICD-10-CM | POA: Diagnosis present

## 2024-09-11 DIAGNOSIS — Z79890 Hormone replacement therapy (postmenopausal): Secondary | ICD-10-CM

## 2024-09-11 DIAGNOSIS — M7989 Other specified soft tissue disorders: Secondary | ICD-10-CM

## 2024-09-11 DIAGNOSIS — Z886 Allergy status to analgesic agent status: Secondary | ICD-10-CM

## 2024-09-11 DIAGNOSIS — Z91018 Allergy to other foods: Secondary | ICD-10-CM

## 2024-09-11 DIAGNOSIS — J439 Emphysema, unspecified: Secondary | ICD-10-CM | POA: Diagnosis present

## 2024-09-11 DIAGNOSIS — M069 Rheumatoid arthritis, unspecified: Secondary | ICD-10-CM | POA: Diagnosis present

## 2024-09-11 DIAGNOSIS — I4891 Unspecified atrial fibrillation: Secondary | ICD-10-CM | POA: Diagnosis present

## 2024-09-11 DIAGNOSIS — M1990 Unspecified osteoarthritis, unspecified site: Secondary | ICD-10-CM | POA: Diagnosis present

## 2024-09-11 DIAGNOSIS — E669 Obesity, unspecified: Secondary | ICD-10-CM | POA: Diagnosis present

## 2024-09-11 DIAGNOSIS — J9612 Chronic respiratory failure with hypercapnia: Secondary | ICD-10-CM | POA: Diagnosis present

## 2024-09-11 DIAGNOSIS — M79604 Pain in right leg: Secondary | ICD-10-CM | POA: Insufficient documentation

## 2024-09-11 DIAGNOSIS — Z86711 Personal history of pulmonary embolism: Secondary | ICD-10-CM

## 2024-09-11 DIAGNOSIS — Z801 Family history of malignant neoplasm of trachea, bronchus and lung: Secondary | ICD-10-CM

## 2024-09-11 DIAGNOSIS — Z888 Allergy status to other drugs, medicaments and biological substances status: Secondary | ICD-10-CM

## 2024-09-11 DIAGNOSIS — Z8249 Family history of ischemic heart disease and other diseases of the circulatory system: Secondary | ICD-10-CM

## 2024-09-11 DIAGNOSIS — Z833 Family history of diabetes mellitus: Secondary | ICD-10-CM

## 2024-09-11 DIAGNOSIS — D638 Anemia in other chronic diseases classified elsewhere: Secondary | ICD-10-CM | POA: Diagnosis present

## 2024-09-11 DIAGNOSIS — Z880 Allergy status to penicillin: Secondary | ICD-10-CM

## 2024-09-11 DIAGNOSIS — I10 Essential (primary) hypertension: Secondary | ICD-10-CM | POA: Diagnosis present

## 2024-09-11 DIAGNOSIS — E8729 Other acidosis: Secondary | ICD-10-CM | POA: Diagnosis present

## 2024-09-11 DIAGNOSIS — Z841 Family history of disorders of kidney and ureter: Secondary | ICD-10-CM

## 2024-09-11 DIAGNOSIS — Z6837 Body mass index (BMI) 37.0-37.9, adult: Secondary | ICD-10-CM

## 2024-09-11 DIAGNOSIS — Z91040 Latex allergy status: Secondary | ICD-10-CM

## 2024-09-11 DIAGNOSIS — Z9049 Acquired absence of other specified parts of digestive tract: Secondary | ICD-10-CM

## 2024-09-11 DIAGNOSIS — D631 Anemia in chronic kidney disease: Secondary | ICD-10-CM | POA: Diagnosis present

## 2024-09-11 DIAGNOSIS — Z885 Allergy status to narcotic agent status: Secondary | ICD-10-CM

## 2024-09-11 DIAGNOSIS — Z823 Family history of stroke: Secondary | ICD-10-CM

## 2024-09-11 DIAGNOSIS — L03115 Cellulitis of right lower limb: Secondary | ICD-10-CM | POA: Diagnosis not present

## 2024-09-11 DIAGNOSIS — G9341 Metabolic encephalopathy: Secondary | ICD-10-CM | POA: Diagnosis present

## 2024-09-11 DIAGNOSIS — J449 Chronic obstructive pulmonary disease, unspecified: Secondary | ICD-10-CM | POA: Diagnosis present

## 2024-09-11 DIAGNOSIS — Z820 Family history of epilepsy and other diseases of the nervous system: Secondary | ICD-10-CM

## 2024-09-11 DIAGNOSIS — N179 Acute kidney failure, unspecified: Secondary | ICD-10-CM | POA: Diagnosis present

## 2024-09-11 DIAGNOSIS — K219 Gastro-esophageal reflux disease without esophagitis: Secondary | ICD-10-CM | POA: Diagnosis present

## 2024-09-11 DIAGNOSIS — Z87891 Personal history of nicotine dependence: Secondary | ICD-10-CM

## 2024-09-11 LAB — COMPREHENSIVE METABOLIC PANEL WITH GFR
ALT: 15 U/L (ref 0–44)
ALT: 12 U/L (ref 0–44)
AST: 17 U/L (ref 15–41)
AST: 16 U/L (ref 15–41)
Albumin: 3 g/dL — ABNORMAL LOW (ref 3.5–5.0)
Albumin: 2.8 g/dL — ABNORMAL LOW (ref 3.5–5.0)
Alkaline Phosphatase: 70 U/L (ref 38–126)
Alkaline Phosphatase: 66 U/L (ref 38–126)
Anion gap: 10 (ref 5–15)
Anion gap: 11 (ref 5–15)
BUN: 26 mg/dL — ABNORMAL HIGH (ref 8–23)
BUN: 24 mg/dL — ABNORMAL HIGH (ref 8–23)
CO2: 28 mmol/L (ref 22–32)
CO2: 28 mmol/L (ref 22–32)
Calcium: 9 mg/dL (ref 8.9–10.3)
Calcium: 8.6 mg/dL — ABNORMAL LOW (ref 8.9–10.3)
Chloride: 93 mmol/L — ABNORMAL LOW (ref 98–111)
Chloride: 94 mmol/L — ABNORMAL LOW (ref 98–111)
Creatinine, Ser: 1.65 mg/dL — ABNORMAL HIGH (ref 0.44–1.00)
Creatinine, Ser: 1.57 mg/dL — ABNORMAL HIGH (ref 0.44–1.00)
GFR, Estimated: 34 mL/min — ABNORMAL LOW (ref >60)
GFR, Estimated: 36 mL/min — ABNORMAL LOW (ref >60)
Glucose, Bld: 95 mg/dL (ref 70–99)
Glucose, Bld: 72 mg/dL (ref 70–99)
Potassium: 4.4 mmol/L (ref 3.5–5.1)
Potassium: 3.7 mmol/L (ref 3.5–5.1)
Sodium: 131 mmol/L — ABNORMAL LOW (ref 135–145)
Sodium: 133 mmol/L — ABNORMAL LOW (ref 135–145)
Total Bilirubin: 0.5 mg/dL (ref 0.0–1.2)
Total Bilirubin: 0.6 mg/dL (ref 0.0–1.2)
Total Protein: 7.4 g/dL (ref 6.5–8.1)
Total Protein: 7 g/dL (ref 6.5–8.1)

## 2024-09-11 LAB — CBC WITH DIFFERENTIAL/PLATELET
Abs Immature Granulocytes: 0.09 K/uL — ABNORMAL HIGH (ref 0.00–0.07)
Basophils Absolute: 0 K/uL (ref 0.0–0.1)
Basophils Relative: 0 %
Eosinophils Absolute: 0.1 K/uL (ref 0.0–0.5)
Eosinophils Relative: 1 %
HCT: 27.7 % — ABNORMAL LOW (ref 36.0–46.0)
Hemoglobin: 8.7 g/dL — ABNORMAL LOW (ref 12.0–15.0)
Immature Granulocytes: 1 %
Lymphocytes Relative: 7 %
Lymphs Abs: 0.8 K/uL (ref 0.7–4.0)
MCH: 29.1 pg (ref 26.0–34.0)
MCHC: 31.4 g/dL (ref 30.0–36.0)
MCV: 92.6 fL (ref 80.0–100.0)
Monocytes Absolute: 1 K/uL (ref 0.1–1.0)
Monocytes Relative: 9 %
Neutro Abs: 8.7 K/uL — ABNORMAL HIGH (ref 1.7–7.7)
Neutrophils Relative %: 82 %
Platelets: 239 K/uL (ref 150–400)
RBC: 2.99 MIL/uL — ABNORMAL LOW (ref 3.87–5.11)
RDW: 16.2 % — ABNORMAL HIGH (ref 11.5–15.5)
WBC: 10.6 K/uL — ABNORMAL HIGH (ref 4.0–10.5)
nRBC: 0 % (ref 0.0–0.2)

## 2024-09-11 LAB — BRAIN NATRIURETIC PEPTIDE: B Natriuretic Peptide: 142.3 pg/mL — ABNORMAL HIGH (ref 0.0–100.0)

## 2024-09-11 LAB — URINALYSIS, W/ REFLEX TO CULTURE (INFECTION SUSPECTED)
Bilirubin Urine: NEGATIVE
Glucose, UA: 50 mg/dL — AB
Hgb urine dipstick: NEGATIVE
Ketones, ur: NEGATIVE mg/dL
Leukocytes,Ua: NEGATIVE
Nitrite: POSITIVE — AB
Protein, ur: NEGATIVE mg/dL
Specific Gravity, Urine: 1.008 (ref 1.005–1.030)
pH: 5 (ref 5.0–8.0)

## 2024-09-11 LAB — TROPONIN I (HIGH SENSITIVITY)
Troponin I (High Sensitivity): 9 ng/L (ref <18)
Troponin I (High Sensitivity): 9 ng/L (ref <18)

## 2024-09-11 LAB — BLOOD GAS, ARTERIAL
Acid-Base Excess: 5.2 mmol/L — ABNORMAL HIGH (ref 0.0–2.0)
Bicarbonate: 32.6 mmol/L — ABNORMAL HIGH (ref 20.0–28.0)
FIO2: 55 %
O2 Content: 15 L/min
O2 Saturation: 95.7 %
Patient temperature: 37
pCO2 arterial: 59 mmHg — ABNORMAL HIGH (ref 32–48)
pH, Arterial: 7.35 (ref 7.35–7.45)
pO2, Arterial: 75 mmHg — ABNORMAL LOW (ref 83–108)

## 2024-09-11 LAB — LACTIC ACID, PLASMA: Lactic Acid, Venous: 1.1 mmol/L (ref 0.5–1.9)

## 2024-09-11 MED ORDER — GABAPENTIN 400 MG PO CAPS
400.0000 mg | ORAL_CAPSULE | Freq: Three times a day (TID) | ORAL | Status: DC
  Administered 2024-09-11 – 2024-09-14 (×9): 400 mg via ORAL
  Filled 2024-09-11 (×10): qty 1

## 2024-09-11 MED ORDER — ONDANSETRON HCL 4 MG/2ML IJ SOLN: 4.0000 mg | Freq: Three times a day (TID) | INTRAMUSCULAR | Status: AC | PRN

## 2024-09-11 MED ORDER — PREDNISONE 10 MG PO TABS
5.0000 mg | ORAL_TABLET | Freq: Every day | ORAL | Status: DC
  Administered 2024-09-11 – 2024-09-14 (×4): 5 mg via ORAL
  Filled 2024-09-11 (×4): qty 1

## 2024-09-11 MED ORDER — FERROUS SULFATE 325 (65 FE) MG PO TABS
325.0000 mg | ORAL_TABLET | Freq: Every day | ORAL | Status: DC
  Administered 2024-09-12 – 2024-09-14 (×3): 325 mg via ORAL
  Filled 2024-09-11 (×3): qty 1

## 2024-09-11 MED ORDER — FUROSEMIDE 10 MG/ML IJ SOLN
40.0000 mg | Freq: Once | INTRAMUSCULAR | Status: AC
  Administered 2024-09-11: 40 mg via INTRAVENOUS
  Filled 2024-09-11: qty 4

## 2024-09-11 MED ORDER — PANTOPRAZOLE SODIUM 40 MG PO TBEC
40.0000 mg | DELAYED_RELEASE_TABLET | Freq: Every day | ORAL | Status: DC
  Administered 2024-09-11 – 2024-09-14 (×4): 40 mg via ORAL
  Filled 2024-09-11 (×4): qty 1

## 2024-09-11 MED ORDER — EMPAGLIFLOZIN 10 MG PO TABS
10.0000 mg | ORAL_TABLET | Freq: Every day | ORAL | Status: DC
Start: 2024-09-12 — End: 2024-09-15
  Administered 2024-09-12 – 2024-09-14 (×3): 10 mg via ORAL
  Filled 2024-09-11 (×3): qty 1

## 2024-09-11 MED ORDER — MELATONIN 5 MG PO TABS
5.0000 mg | ORAL_TABLET | Freq: Every day | ORAL | Status: DC
  Administered 2024-09-11 – 2024-09-13 (×3): 5 mg via ORAL
  Filled 2024-09-11 (×3): qty 1

## 2024-09-11 MED ORDER — ACETAMINOPHEN 325 MG PO TABS
650.0000 mg | ORAL_TABLET | Freq: Four times a day (QID) | ORAL | Status: DC | PRN
  Administered 2024-09-11: 650 mg via ORAL
  Filled 2024-09-11: qty 2

## 2024-09-11 MED ORDER — RIVAROXABAN 20 MG PO TABS
20.0000 mg | ORAL_TABLET | Freq: Every day | ORAL | Status: DC
  Administered 2024-09-11 – 2024-09-14 (×4): 20 mg via ORAL
  Filled 2024-09-11 (×4): qty 1

## 2024-09-11 MED ORDER — FOLIC ACID 1 MG PO TABS
1.0000 mg | ORAL_TABLET | Freq: Every day | ORAL | Status: DC
  Administered 2024-09-11 – 2024-09-14 (×4): 1 mg via ORAL
  Filled 2024-09-11 (×4): qty 1

## 2024-09-11 MED ORDER — ATORVASTATIN CALCIUM 10 MG PO TABS
10.0000 mg | ORAL_TABLET | Freq: Every day | ORAL | Status: DC
  Administered 2024-09-11 – 2024-09-13 (×3): 10 mg via ORAL
  Filled 2024-09-11 (×3): qty 1

## 2024-09-11 MED ORDER — DILTIAZEM HCL ER COATED BEADS 120 MG PO CP24
120.0000 mg | ORAL_CAPSULE | Freq: Every day | ORAL | Status: DC
Start: 2024-09-12 — End: 2024-09-15
  Administered 2024-09-12 – 2024-09-14 (×3): 120 mg via ORAL
  Filled 2024-09-11 (×3): qty 1

## 2024-09-11 MED ORDER — ALBUTEROL SULFATE (2.5 MG/3ML) 0.083% IN NEBU: 2.5000 mg | INHALATION_SOLUTION | RESPIRATORY_TRACT | Status: AC | PRN

## 2024-09-11 MED ORDER — FUROSEMIDE 10 MG/ML IJ SOLN
40.0000 mg | Freq: Two times a day (BID) | INTRAMUSCULAR | Status: DC
  Administered 2024-09-11 – 2024-09-14 (×6): 40 mg via INTRAVENOUS
  Filled 2024-09-11 (×6): qty 4

## 2024-09-11 MED ORDER — HYDROXYCHLOROQUINE SULFATE 200 MG PO TABS
200.0000 mg | ORAL_TABLET | Freq: Every day | ORAL | Status: DC
  Administered 2024-09-11 – 2024-09-14 (×4): 200 mg via ORAL
  Filled 2024-09-11 (×4): qty 1

## 2024-09-11 MED ORDER — TRAMADOL HCL 50 MG PO TABS
50.0000 mg | ORAL_TABLET | ORAL | Status: DC | PRN
  Administered 2024-09-11 – 2024-09-12 (×2): 50 mg via ORAL
  Filled 2024-09-11 (×2): qty 1

## 2024-09-11 MED ORDER — ACETAMINOPHEN 500 MG PO TABS
1000.0000 mg | ORAL_TABLET | Freq: Once | ORAL | Status: AC
  Administered 2024-09-11: 1000 mg via ORAL
  Filled 2024-09-11: qty 2

## 2024-09-11 MED ORDER — LEVOTHYROXINE SODIUM 100 MCG PO TABS
150.0000 ug | ORAL_TABLET | Freq: Every day | ORAL | Status: DC
  Administered 2024-09-12 – 2024-09-14 (×3): 150 ug via ORAL
  Filled 2024-09-11 (×3): qty 1

## 2024-09-11 NOTE — Progress Notes (Signed)
 Report received bedside from [facility]. All questions and concerns addressed at bedside.

## 2024-09-11 NOTE — ED Notes (Signed)
 This RN called RT for a new alternative to [name].  is not the same as the pt uses at facility and despite being taped in place, Sats are dropping in the upper 80's. MD assigned to pt aware.

## 2024-09-11 NOTE — ED Provider Notes (Signed)
 [facility] Provider Note    Event Date/Time   First MD Initiated Contact with Patient 09/11/24 [phone]     (approximate)   History   Altered Mental Status   HPI  [name] is a 68 y.o. female history of RA, hypertension, CHF, COPD on 6 L nasal cannula at baseline, history of PE, atrial fibrillation on Xarelto , presenting with shortness of breath and altered mental status.  Per independent history from EMS, facility had felt that patient was a bit more confused than usual for the last 3 to 4 days.  Was found on her 6 L satting in the low 90s.  No reported trauma or falls.  She has been compliant with her medications.  Patient states that she has been feeling slightly more short of breath, states that her legs are more swollen than usual bilaterally, she denies any urinary symptoms, no nausea vomiting or diarrhea, no chest pain or cough.  Independent history obtained from EMS as above.  On independent chart review, she was admitted in July for acute hypoxic respiratory failure secondary to CHF.  Had sustained a laceration to her lower leg at the time.     Physical Exam   Triage Vital Signs: ED Triage Vitals  Encounter Vitals Group     BP      Girls Systolic BP Percentile      Girls Diastolic BP Percentile      Boys Systolic BP Percentile      Boys Diastolic BP Percentile      Pulse      Resp      Temp      Temp src      SpO2      Weight      Height      Head Circumference      [facility]      Pain Score      Pain Loc      Pain Education      Exclude from Growth Chart     Most recent vital signs: Vitals:   09/11/24 0300 09/11/24 0330  BP: (!) 106/59 (!) 121/90  Pulse: 81 92  Resp: 17 15  Temp:    SpO2: 100% 97%     General: Awake, no distress.  ANO x 3 CV:  Good peripheral perfusion.  Resp:  Normal effort.  Crackles on the left Abd:  No distention.  Soft nontender Other:  Bilateral lower extremity edema that appears  symmetrical, she is healing wound on her left shin without any purulent drainage, she has chronic venous stasis changes without purulent drainage, or fluctuance.   ED Results / Procedures / Treatments   Labs (all labs ordered are listed, but only abnormal results are displayed) Labs Reviewed  BRAIN NATRIURETIC PEPTIDE - Abnormal; Notable for the following components:      Result Value   B Natriuretic Peptide 142.3 (*)    All other components within normal limits  COMPREHENSIVE METABOLIC PANEL WITH GFR - Abnormal; Notable for the following components:   Sodium 131 (*)    Chloride 93 (*)    BUN 26 (*)    Creatinine, Ser 1.65 (*)    Albumin 3.0 (*)    GFR, Estimated 34 (*)    All other components within normal limits  CBC WITH DIFFERENTIAL/PLATELET - Abnormal; Notable for the following components:   WBC 10.6 (*)    RBC 2.99 (*)    Hemoglobin 8.7 (*)  HCT 27.7 (*)    RDW 16.2 (*)    Neutro Abs 8.7 (*)    Abs Immature Granulocytes 0.09 (*)    All other components within normal limits  URINALYSIS, W/ REFLEX TO CULTURE (INFECTION SUSPECTED) - Abnormal; Notable for the following components:   Color, Urine YELLOW (*)    APPearance CLEAR (*)    Glucose, UA 50 (*)    Nitrite POSITIVE (*)    Bacteria, UA MANY (*)    All other components within normal limits  CULTURE, BLOOD (ROUTINE X 2)  CULTURE, BLOOD (ROUTINE X 2)  LACTIC ACID, PLASMA  TROPONIN I (HIGH SENSITIVITY)  TROPONIN I (HIGH SENSITIVITY)     EKG  EKG shows, A-fib, rate 93, normal QS, normal QTc, no obvious ischemic ST elevation, T wave flattening to 3, aVF, V3, V2, T wave changes new compared to prior   RADIOLOGY On my independent interpretation, chest x-ray without obvious consolidation   PROCEDURES:  Critical Care performed: No  Procedures   MEDICATIONS ORDERED IN ED: Medications  furosemide  (LASIX ) injection 40 mg (40 mg Intravenous Given 09/11/24 0126)     IMPRESSION / MDM / ASSESSMENT AND PLAN /  ED COURSE  I reviewed the triage vital signs and the nursing notes.                              Differential diagnosis includes, but is not limited to, electrolyte derangements, sepsis, CHF exacerbation, volume overload, atypical ACS, arrhythmia, did consider PE but patient states that she has been compliant with her medications including her anticoagulation.  Is not appear acutely altered on my examination.  Will get labs, blood cultures, lactic acid, UA, chest x-ray, CT head, give her some Lasix  here.  She will need to be admitted for further management.  Patient's presentation is most consistent with acute presentation with potential threat to life or bodily function.  Independent interpretation of labs and imaging below.  Given AKI, volume overload, she will need to be admitted for further management.  Consulted hospitalist was agreeable with plan for admission and will evaluate the patient.  She is admitted.  The patient is on the cardiac monitor to evaluate for evidence of arrhythmia and/or significant heart rate changes.   Clinical Course as of 09/11/24 0421  Sat Sep 11, 2024  0150 CT Head Wo Contrast IMPRESSION: 1. No acute intracranial abnormality. 2. Mild chronic white matter small vessel ischemic changes. 3. Empty sella. Further evaluation with nonemergent MRI is recommended.   [TT]  0151 DG Chest 1 View 1. Stable exam without acute or active cardiopulmonary disease.  [TT]  0155 Independent review of labs, mild hyponatremia, she has an AKI, LFTs are normal, BNP is elevated, troponin is not elevated, lactate is normal, she is a mild leukocytosis. [TT]  0250 Urinalysis, w/ Reflex to Culture (Infection Suspected) -Urine, Clean Catch(!) UA shows nitrites and many bacteria but 0 leukocytes, no WBCs, she denies any dysuria or urinary frequency at this time, will hold off antibiotics. [TT]    Clinical Course User Index [TT] Waymond Lorelle Cummins, MD     FINAL CLINICAL IMPRESSION(S) /  ED DIAGNOSES   Final diagnoses:  Acute on chronic congestive heart failure, unspecified heart failure type (HCC)  Shortness of breath  Leg swelling     Rx / DC Orders   ED Discharge Orders     None        Note:  This document was prepared using Dragon voice recognition software and may include unintentional dictation errors.    Waymond Lorelle Cummins, MD 09/11/24 [phone]

## 2024-09-11 NOTE — ED Triage Notes (Signed)
 Pt arrives via EMS from [facility] for AMS x 3-4 days. Pt arrives awake and alert

## 2024-09-11 NOTE — ED Notes (Signed)
 This RN checked pt brief. Brief dry. Warm blankets given

## 2024-09-11 NOTE — H&P (Addendum)
 History and Physical    [name] FMW:[id number] DOB: 30-Nov-1956 DOA: 09/11/2024  DOS: the patient was seen and examined on 09/11/2024  PCP: [name] DASEN, PA-C   Patient coming from: SNF  I have personally briefly reviewed patient's old medical records in [facility]  Chief Complaint: Shortness of breath and change in mental status  HPI: [name] is a pleasant 68 y.o. female with medical history significant for COPD on 6 L nasal cannula oxygen at baseline, CHF, HTN, RA, history of PE on Xarelto , A-fib who was brought in to [facility] emergency room from nursing home for worsening shortness of breath and change in mental status.  Per EMS note, facility felt that patient was a bit more confused than usual for the last 3 or 4 days.  She was found on 6 L saturating in low 90s.  There was no history of fever, cough, chest pain, palpitations.  There was no history of trauma or falls.  Patient was taking all the medications as prescribed.  At facility patient was noticed to have more short of breath and leg swelling then she was transferred to [facility] ED. Patient denies any chest pain, nausea, vomiting.  She complained this morning that she had lot of aches and pains all over the body otherwise no other symptoms.  Patient was alert awake and was able to answer all the questions appropriately this morning.  ED Course: Upon arrival to the ED, patient is found to be A&O x 3, BNP 142, sodium 131, BUN 26, creatinine 1.65, white count 10.6.  EKG showed A-fib, chest x-ray showed no pneumonia.  CT head without any acute abnormalities.  UA shows no signs of UTI.  Patient received IV Lasix  40 mg and hospitalist service was consulted for evaluation for admission.  Review of Systems:  ROS  All other systems negative except as noted in the HPI.  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    HTN (hypertension)    Hypercholesteremia    Hypothyroid    Rheumatoid arthritis(714.0)    Varicose vein of leg     Past Surgical History:  Procedure Laterality Date   APPENDECTOMY     CHOLECYSTECTOMY     ELBOW SURGERY     LEFT   ENDOSCOPIC VEIN LASER TREATMENT     RIGHT LEG   EXPLORATORY LAPAROTOMY     HAND SURGERY     RIGHT    HEEL SPUR SURGERY     BILAT   INSERTION OF MESH N/A 08/04/2017   Procedure: INSERTION OF MESH;  Surgeon: Eletha Boas, MD;  Location: [facility] OR;  Service: General;  Laterality: N/A;   partial thyroidectomy  2015   TONSILLECTOMY     TUBAL LIGATION     VENTRAL HERNIA REPAIR N/A 08/04/2017   Procedure: LAPAROSCOPIC VENTRAL INCISIONAL HERNIA REPAIR WITH MESH;  Surgeon: Eletha Boas, MD;  Location: [facility] OR;  Service: General;  Laterality: N/A;     reports that she quit smoking about 4 years ago. Her smoking use included cigarettes. She started smoking about 44 years ago. She has a 40 pack-year smoking history. She has never used smokeless tobacco. She reports that she does not drink alcohol and does not use drugs.  Allergies  Allergen Reactions   Latex Hives, Itching and Dermatitis    Blisters (also)  Skin blisters   Oxycodone -Acetaminophen  Nausea Only and Other (See Comments)    Violent Vomiting Violent Vomiting   Penicillins Anaphylaxis, Swelling and Rash    Has patient had a PCN reaction causing immediate rash, facial/tongue/throat swelling, SOB or lightheadedness with hypotension: Yes Has patient had a PCN reaction causing severe rash involving mucus membranes or skin necrosis: No Has patient had a PCN reaction that required hospitalization No Has patient had a PCN reaction occurring within the last 10 years: No If all of the above answers are NO, then may proceed with Cephalosporin use.    Strawberry Extract Hives, Itching, Rash, Anaphylaxis, Dermatitis and Swelling    Oxycodone -Acetaminophen  Nausea And Vomiting   Tyloxapol Nausea And Vomiting   Adhesive [Tape] Rash    Patient prefers paper tape   Wound Dressing Adhesive Dermatitis    Family History  Problem Relation Age of Onset   Diabetes Mother    Kidney disease Mother    Lung cancer Father    Alzheimer's disease Maternal Grandmother    Stroke Maternal Grandfather    Heart disease Paternal Grandmother    Unexplained death Paternal Grandfather     Prior to Admission medications   Medication Sig Start Date End Date Taking? Authorizing Provider  acetaminophen  (TYLENOL ) 500 MG tablet Take 500 mg by mouth every 8 (eight) hours as needed for mild pain (pain score 1-3).    [provider]  atorvastatin  (LIPITOR) 10 MG tablet Take 10 mg by mouth at bedtime.    [provider]  benzonatate (TESSALON) 100 MG capsule Take 100 mg by mouth every 8 (eight) hours as needed for cough.    [provider]  budesonide  (PULMICORT ) 0.5 MG/2ML nebulizer solution Take 0.5 mg by nebulization 2 (two) times daily.    [provider]  Cyanocobalamin  1000 MCG TBCR Take 1 tablet by mouth daily.    [provider]  diltiazem  (CARDIZEM  CD) 120 MG 24 hr capsule Take 1 capsule (120 mg total) by mouth daily. 07/01/24   Amin, Sumayya, MD  doxycycline  (ADOXA) 100 MG tablet Take 100 mg by mouth 2 (two) times daily.    [provider]  empagliflozin  (JARDIANCE ) 10 MG TABS tablet Take 1 tablet (10 mg total) by mouth daily before breakfast. 07/27/24   Donette Ellouise LABOR, FNP  ferrous sulfate  325 (65 FE) MG EC tablet Take 325 mg by mouth daily with breakfast.    [provider]  folic acid  (FOLVITE ) 1 MG tablet Take 1 mg by mouth daily.    [provider]  furosemide  (LASIX ) 40 MG tablet Take 1 tablet (40 mg total) by mouth daily. 07/01/24   Amin, Sumayya, MD  gabapentin  (NEURONTIN ) 400 MG capsule Take 400 mg by mouth 3 (three) times daily.    [provider]   hydroxychloroquine  (PLAQUENIL ) 200 MG tablet Take 200 mg by mouth daily.    [provider]  ipratropium-albuterol  (DUONEB) 0.5-2.5 (3) MG/3ML SOLN Take 3 mLs by nebulization every 6 (six) hours.    [provider]  levofloxacin  (LEVAQUIN ) 750 MG tablet Take 750 mg by mouth daily.    [provider]  levothyroxine  (SYNTHROID ) 150 MCG tablet Take 150 mcg by mouth daily before breakfast.    [provider]  melatonin 5 MG TABS Take 5 mg by mouth at bedtime.    [provider]  montelukast  (SINGULAIR ) 10 MG tablet  Take 10 mg by mouth daily.    [provider]  Multiple Vitamin (MULTIVITAMIN) tablet Take 1 tablet by mouth daily.    [provider]  oxymetazoline  (AFRIN) 0.05 % nasal spray Place 1 spray into both nostrils 2 (two) times daily. 05/24/24   Wouk, Devaughn Sayres, MD  pantoprazole  (PROTONIX ) 40 MG tablet Take 40 mg by mouth daily.    [provider]  predniSONE  (DELTASONE ) 5 MG tablet Take 5 mg by mouth daily with breakfast.    [provider]  rivaroxaban  (XARELTO ) 20 MG TABS tablet Take 20 mg by mouth daily.    [provider]  sennosides-docusate sodium  (SENOKOT-S) 8.6-50 MG tablet Take 2 tablets by mouth 2 (two) times daily.    [provider]  sertraline  (ZOLOFT ) 25 MG tablet Take 3 tablets (75 mg total) by mouth daily. 07/01/24   Amin, Sumayya, MD  traMADol  HCl 25 MG TABS Take by mouth. PRN    [provider]    Physical Exam: Vitals:   09/11/24 0544 09/11/24 0708 09/11/24 0710 09/11/24 1000  BP:   111/65 (!) 107/54  Pulse:   91 93  Resp:   20 18  Temp: 98.6 F (37 C)   98.4 F (36.9 C)  TempSrc: Oral   Oral  SpO2:  99% 100% 95%  Weight:      Height:        Physical Exam   Constitutional: Alert, awake, calm, comfortable HEENT: Neck supple Respiratory: Bilateral decreased air entry at the bases, occasional wheezes scattered rhonchi no rales Cardiovascular: Regular  rate and rhythm, no murmurs / rubs / gallops. No extremity edema. 2+ pedal pulses. No carotid bruits.  Abdomen: Soft, no tenderness, Bowel sounds positive.  Musculoskeletal: no clubbing / cyanosis. Good ROM, no contractures. Normal muscle tone.  Skin: no rashes, lesions, ulcers. Neurologic: CN 2-12 grossly intact. Sensation intact, No focal deficit identified Psychiatric: Alert and oriented x 3. Normal mood.    Labs on Admission: I have personally reviewed following labs and imaging studies  CBC: Recent Labs  Lab 09/11/24 0102  WBC 10.6*  NEUTROABS 8.7*  HGB 8.7*  HCT 27.7*  MCV 92.6  PLT 239   Basic Metabolic Panel: Recent Labs  Lab 09/11/24 0102  NA 131*  K 4.4  CL 93*  CO2 28  GLUCOSE 95  BUN 26*  CREATININE 1.65*  CALCIUM  9.0   GFR: Estimated Creatinine Clearance: 37.4 mL/min (A) (by C-G formula based on SCr of 1.65 mg/dL (H)). Liver Function Tests: Recent Labs  Lab 09/11/24 0102  AST 17  ALT 15  ALKPHOS 70  BILITOT 0.5  PROT 7.4  ALBUMIN 3.0*   No results for input(s): LIPASE, AMYLASE in the last 168 hours. No results for input(s): AMMONIA in the last 168 hours. Coagulation Profile: No results for input(s): INR, PROTIME in the last 168 hours. Cardiac Enzymes: Recent Labs  Lab 09/11/24 0102 09/11/24 0258  TROPONINIHS 9 9   BNP (last 3 results) Recent Labs    05/30/24 1658 06/14/24 0947 09/11/24 0102  BNP 750.8* 163.5* 142.3*   HbA1C: No results for input(s): HGBA1C in the last 72 hours. CBG: No results for input(s): GLUCAP in the last 168 hours. Lipid Profile: No results for input(s): CHOL, HDL, LDLCALC, TRIG, CHOLHDL, LDLDIRECT in the last 72 hours. Thyroid  Function Tests: No results for input(s): TSH, T4TOTAL, FREET4, T3FREE, THYROIDAB in the last 72 hours. Anemia Panel: No results for input(s): VITAMINB12, FOLATE, FERRITIN, TIBC, IRON, RETICCTPCT  in the last 72 hours. Urine analysis:     Component Value Date/Time   COLORURINE YELLOW (A) 09/11/2024 0156   APPEARANCEUR CLEAR (A) 09/11/2024 0156   LABSPEC 1.008 09/11/2024 0156   PHURINE 5.0 09/11/2024 0156   GLUCOSEU 50 (A) 09/11/2024 0156   HGBUR NEGATIVE 09/11/2024 0156   HGBUR small 07/21/2008 1538   BILIRUBINUR NEGATIVE 09/11/2024 0156   KETONESUR NEGATIVE 09/11/2024 0156   PROTEINUR NEGATIVE 09/11/2024 0156   UROBILINOGEN 0.2 04/23/2015 1851   NITRITE POSITIVE (A) 09/11/2024 0156   LEUKOCYTESUR NEGATIVE 09/11/2024 0156    Radiological Exams on Admission: I have personally reviewed images CT Head Wo Contrast Result Date: 09/11/2024 CLINICAL DATA:  Altered mental status for 4 days. EXAM: CT HEAD WITHOUT CONTRAST TECHNIQUE: Contiguous axial images were obtained from the base of the skull through the vertex without intravenous contrast. RADIATION DOSE REDUCTION: This exam was performed according to the departmental dose-optimization program which includes automated exposure control, adjustment of the mA and/or kV according to patient size and/or use of iterative reconstruction technique. COMPARISON:  June 14, 2024 FINDINGS: Brain: There is no evidence of acute infarction, hemorrhage, hydrocephalus, extra-axial collection or mass lesion/mass effect. There are areas of mildly decreased attenuation within the white matter tracts of the supratentorial brain, consistent with microvascular disease changes. An empty sella is noted. Vascular: No hyperdense vessel or unexpected calcification. Skull: Normal. Negative for fracture or focal lesion. Sinuses/Orbits: No acute finding. Other: None. IMPRESSION: 1. No acute intracranial abnormality. 2. Mild chronic white matter small vessel ischemic changes. 3. Empty sella. Further evaluation with nonemergent MRI is recommended. Electronically Signed   By: Suzen Dials M.D.   On: 09/11/2024 01:43   DG Chest 1 View Result Date: 09/11/2024 CLINICAL DATA:  Altered mental status for 4 days.  EXAM: CHEST  1 VIEW COMPARISON:  June 16, 2024 FINDINGS: The cardiac silhouette is mildly enlarged and unchanged in size. Moderate severity calcification of the aortic arch is seen. There is stable moderate to marked severity elevation of the right hemidiaphragm. A stable 5 mm calcified lung nodule is seen within the lateral aspect of the mid right lung. No acute infiltrate, pleural effusion or pneumothorax is identified. Radiopaque surgical clips are seen overlying the neck soft tissues and right upper quadrant. Multilevel degenerative changes are present throughout the thoracic spine. IMPRESSION: 1. Stable exam without acute or active cardiopulmonary disease. Electronically Signed   By: Suzen Dials M.D.   On: 09/11/2024 01:36    EKG: My personal interpretation of EKG shows: A-fib at 93 bpm    Assessment/Plan Principal Problem:   CHF (congestive heart failure) (HCC) Active Problems:   Acute hypoxic on chronic hypercapnic respiratory failure (HCC)   Acute on chronic heart failure with preserved ejection fraction (HFpEF) (HCC)   Chronic obstructive pulmonary disease (HCC)   Hypothyroidism   Essential hypertension   Rheumatoid arthritis (HCC)   Hyperlipidemia   OSA on CPAP    Assessment and Plan: This is a 68 year old female nursing home resident W/PMH of COPD on 6 L home oxygen, HFpEF, HTN, HLD, OSA on CPAP, hypothyroidism, rheumatoid arthritis who was brought into ED complaining of acute change in mental status and shortness of breath.  1.  Acute on chronic hypoxemic/hypercarbic respiratory failure in the setting of CHF and COPD - She had a similar incident a month ago and was treated for CHF. - She has mild leg swelling. - She received 1 dose of IV Lasix  40 mg and with good urine  output and improvement in her saturation. - Will place her on Lasix  40 mg twice daily. - Will place her on heart failure protocol - Will also give her nebulization for COPD.  Will get ABG and if needed  will place her on BiPAP.  2.  COPD without exacerbation - Her oxygenation appears to be at baseline - Will check ABG - Will continue her on nebulization  3.  HFpEF - As mentioned above she will be getting Lasix  40 mg twice daily - Heart failure protocol, daily weight, input output charting  4.  HTN/HLD/hypothyroidism - Resume her home medications  5.  OSA on CPAP - Resume CPAP during the night  6.  Rheumatoid arthritis - Resume her home dose of medications Plaquenil   7.  GERD - Continue Protonix   8.  History of PE - Continue on Xarelto   9.  Atrial fibrillation with controlled response - Continue beta-blocker and Xarelto  at home dose    DVT prophylaxis: Xarelto  Code Status: Full Code Family Communication: None Disposition Plan: Back to skilled nursing facility Consults called: None Admission status: Inpatient, Telemetry bed   Nena Rebel, MD [facility] 09/11/2024, 10:33 AM

## 2024-09-11 NOTE — ED Notes (Signed)
 This RN called RT to obtain ABG

## 2024-09-11 NOTE — ED Notes (Signed)
 This RN place Pt on humidified [place]. Due to pts nasal anatomy the canula keeps falling out of nose. Paper tape applied to secure canula. Pt tolerated well. Warm blankets given

## 2024-09-11 NOTE — Plan of Care (Signed)
   Problem: Education: Goal: Knowledge of General Education information will improve Description Including pain rating scale, medication(s)/side effects and non-pharmacologic comfort measures Outcome: Progressing   Problem: Health Behavior/Discharge Planning: Goal: Ability to manage health-related needs will improve Outcome: Progressing

## 2024-09-12 DIAGNOSIS — I509 Heart failure, unspecified: Secondary | ICD-10-CM | POA: Diagnosis not present

## 2024-09-12 DIAGNOSIS — R0602 Shortness of breath: Secondary | ICD-10-CM | POA: Diagnosis not present

## 2024-09-12 DIAGNOSIS — L03115 Cellulitis of right lower limb: Secondary | ICD-10-CM | POA: Diagnosis not present

## 2024-09-12 DIAGNOSIS — Z6837 Body mass index (BMI) 37.0-37.9, adult: Secondary | ICD-10-CM | POA: Diagnosis not present

## 2024-09-12 DIAGNOSIS — I5033 Acute on chronic diastolic (congestive) heart failure: Secondary | ICD-10-CM | POA: Diagnosis present

## 2024-09-12 DIAGNOSIS — K219 Gastro-esophageal reflux disease without esophagitis: Secondary | ICD-10-CM | POA: Diagnosis present

## 2024-09-12 DIAGNOSIS — I10 Essential (primary) hypertension: Secondary | ICD-10-CM

## 2024-09-12 DIAGNOSIS — M069 Rheumatoid arthritis, unspecified: Secondary | ICD-10-CM | POA: Diagnosis present

## 2024-09-12 DIAGNOSIS — Z515 Encounter for palliative care: Secondary | ICD-10-CM | POA: Diagnosis not present

## 2024-09-12 DIAGNOSIS — J9622 Acute and chronic respiratory failure with hypercapnia: Secondary | ICD-10-CM | POA: Diagnosis present

## 2024-09-12 DIAGNOSIS — Z7901 Long term (current) use of anticoagulants: Secondary | ICD-10-CM | POA: Diagnosis not present

## 2024-09-12 DIAGNOSIS — G4733 Obstructive sleep apnea (adult) (pediatric): Secondary | ICD-10-CM | POA: Diagnosis present

## 2024-09-12 DIAGNOSIS — R4182 Altered mental status, unspecified: Secondary | ICD-10-CM | POA: Diagnosis present

## 2024-09-12 DIAGNOSIS — N1831 Chronic kidney disease, stage 3a: Secondary | ICD-10-CM | POA: Diagnosis present

## 2024-09-12 DIAGNOSIS — J9621 Acute and chronic respiratory failure with hypoxia: Secondary | ICD-10-CM | POA: Diagnosis present

## 2024-09-12 DIAGNOSIS — D631 Anemia in chronic kidney disease: Secondary | ICD-10-CM | POA: Diagnosis present

## 2024-09-12 DIAGNOSIS — G9341 Metabolic encephalopathy: Secondary | ICD-10-CM | POA: Insufficient documentation

## 2024-09-12 DIAGNOSIS — N189 Chronic kidney disease, unspecified: Secondary | ICD-10-CM

## 2024-09-12 DIAGNOSIS — J439 Emphysema, unspecified: Secondary | ICD-10-CM

## 2024-09-12 DIAGNOSIS — I4891 Unspecified atrial fibrillation: Secondary | ICD-10-CM | POA: Diagnosis present

## 2024-09-12 DIAGNOSIS — J9612 Chronic respiratory failure with hypercapnia: Secondary | ICD-10-CM

## 2024-09-12 DIAGNOSIS — J9601 Acute respiratory failure with hypoxia: Secondary | ICD-10-CM

## 2024-09-12 DIAGNOSIS — D67 Hereditary factor IX deficiency: Secondary | ICD-10-CM | POA: Diagnosis present

## 2024-09-12 DIAGNOSIS — E871 Hypo-osmolality and hyponatremia: Secondary | ICD-10-CM | POA: Diagnosis present

## 2024-09-12 DIAGNOSIS — E7800 Pure hypercholesterolemia, unspecified: Secondary | ICD-10-CM | POA: Diagnosis present

## 2024-09-12 DIAGNOSIS — E890 Postprocedural hypothyroidism: Secondary | ICD-10-CM | POA: Diagnosis present

## 2024-09-12 DIAGNOSIS — N179 Acute kidney failure, unspecified: Secondary | ICD-10-CM

## 2024-09-12 DIAGNOSIS — E039 Hypothyroidism, unspecified: Secondary | ICD-10-CM

## 2024-09-12 DIAGNOSIS — Z7951 Long term (current) use of inhaled steroids: Secondary | ICD-10-CM | POA: Diagnosis not present

## 2024-09-12 DIAGNOSIS — Z9981 Dependence on supplemental oxygen: Secondary | ICD-10-CM | POA: Diagnosis not present

## 2024-09-12 DIAGNOSIS — M7989 Other specified soft tissue disorders: Secondary | ICD-10-CM | POA: Diagnosis not present

## 2024-09-12 DIAGNOSIS — I130 Hypertensive heart and chronic kidney disease with heart failure and stage 1 through stage 4 chronic kidney disease, or unspecified chronic kidney disease: Secondary | ICD-10-CM | POA: Diagnosis present

## 2024-09-12 DIAGNOSIS — E669 Obesity, unspecified: Secondary | ICD-10-CM | POA: Diagnosis present

## 2024-09-12 DIAGNOSIS — E8729 Other acidosis: Secondary | ICD-10-CM | POA: Diagnosis present

## 2024-09-12 LAB — CBC
HCT: 26.9 % — ABNORMAL LOW (ref 36.0–46.0)
Hemoglobin: 8.3 g/dL — ABNORMAL LOW (ref 12.0–15.0)
MCH: 28.6 pg (ref 26.0–34.0)
MCHC: 30.9 g/dL (ref 30.0–36.0)
MCV: 92.8 fL (ref 80.0–100.0)
Platelets: 208 K/uL (ref 150–400)
RBC: 2.9 MIL/uL — ABNORMAL LOW (ref 3.87–5.11)
RDW: 16 % — ABNORMAL HIGH (ref 11.5–15.5)
WBC: 8.1 K/uL (ref 4.0–10.5)
nRBC: 0 % (ref 0.0–0.2)

## 2024-09-12 LAB — MAGNESIUM: Magnesium: 1.9 mg/dL (ref 1.7–2.4)

## 2024-09-12 MED ORDER — TRAMADOL HCL 50 MG PO TABS
50.0000 mg | ORAL_TABLET | Freq: Four times a day (QID) | ORAL | Status: DC | PRN
  Administered 2024-09-13 – 2024-09-14 (×4): 50 mg via ORAL
  Filled 2024-09-12 (×4): qty 1

## 2024-09-12 NOTE — Assessment & Plan Note (Signed)
 1 pulse ox of 87% on her usual oxygen and required Venturi mask.  Patient was on 7 L yesterday morning and baseline on 6 L.  Last use BiPAP at night and while taking naps with elevation of pCO2.  Will have care manager to make sure she has a BiPAP at her facility.

## 2024-09-12 NOTE — Hospital Course (Signed)
 68 y.o. female with medical history significant for COPD on 6 L nasal cannula oxygen at baseline, CHF, HTN, RA, history of PE on Xarelto , A-fib who was brought in to [facility] emergency room from nursing home for worsening shortness of breath and change in mental status.  Per EMS note, facility felt that patient was a bit more confused than usual for the last 3 or 4 days.  She was found on 6 L saturating in low 90s.  There was no history of fever, cough, chest pain, palpitations.  There was no history of trauma or falls.  Patient was taking all the medications as prescribed.  At facility patient was noticed to have more short of breath and leg swelling then she was transferred to [facility] ED. Patient denies any chest pain, nausea, vomiting.  She complained this morning that she had lot of aches and pains all over the body otherwise no other symptoms.  Patient was alert awake and was able to answer all the questions appropriately this morning.   ED Course: Upon arrival to the ED, patient is found to be A&O x 3, BNP 142, sodium 131, BUN 26, creatinine 1.65, white count 10.6.  EKG showed A-fib, chest x-ray showed no pneumonia.  CT head without any acute abnormalities.  UA shows no signs of UTI.  Patient received IV Lasix  40 mg and hospitalist service was consulted for evaluation for admission.  9/28.  Patient still not feeling well.  Still with some shortness of breath.  Baseline oxygen 6 L and wears BiPAP at night.  9/29. Continue iv lasix  today 9/30.  Feeling better today.  Will discharge back to facility.

## 2024-09-12 NOTE — Assessment & Plan Note (Signed)
 On Plaquenil

## 2024-09-12 NOTE — Assessment & Plan Note (Signed)
 On Xarelto

## 2024-09-12 NOTE — Assessment & Plan Note (Signed)
 Seems improved likely secondary to CO2 retention.

## 2024-09-12 NOTE — Evaluation (Signed)
 Physical Therapy Evaluation Patient Details Name: BEVELY HACKBART MRN: [id number] DOB: Jul 19, 1956 Today's Date: 09/12/2024  History of Present Illness  Pt admitted to [facility] on 09/11/24 from [facility] under observation for SOB, worsening BLE edema, and AMS secondary to CHF. Significant PMH includes: RA, HTN, CHF, COPD (6L at baseline), hx of PE, hypothyroidism, Afib (Xarelto ), chronic venous stasis.   Clinical Impression  Pt received in supine with nursing at bedside, and is agreeable for PT eval. At baseline, pt is a LTC patient at [facility]. She requires some assistance with ADL's but is able to participate in some UB ADL's. She is IND with bed mobility and requires some assistance from OOB transfers to/from her [facility] she also requires assist with WC propulsion.   Pt presents with generalized weakness, decreased bil shoulder AROM, increased pain levels, impaired skin integrity, decreased gross balance, decreased activity tolerance, and lethargy, resulting in impaired functional mobility from baseline. Due to deficits, pt required maxA for bed mobility, minA for transfers with RW, and CGA to ambulate 73ft from EOB>recliner with RW. Increased time/effort required during session secondary to c/o generalized pain and stiffness from RA.   Deficits limit the pt's ability to safely and independently perform ADL's, transfer, and ambulate. Pt will benefit from acute skilled PT services to address deficits for return to baseline function. Pt will benefit from post acute therapy services to address deficits for return to baseline function.         If plan is discharge home, recommend the following: A little help with walking and/or transfers;A little help with bathing/dressing/bathroom;Assistance with cooking/housework;Direct supervision/assist for medications management;Assist for transportation;Help with stairs or ramp for entrance   Can travel by private vehicle   Yes    Equipment  Recommendations  (defer to post acute)     Functional Status Assessment Patient has had a recent decline in their functional status and demonstrates the ability to make significant improvements in function in a reasonable and predictable amount of time.     Precautions / Restrictions Precautions Precautions: Fall Restrictions Weight Bearing Restrictions Per Provider Order: No      Mobility  Bed Mobility               General bed mobility comments: maxA for trunk and BLE facilitation to sit EOB, HOB elevated, hand over hand cues for UE support on railing; increased time/effort, max multimodal cues for safety, sequencing, and hand placement.    Transfers                   General transfer comment: minA for power to stand from EOB with RW, increased time/effort to achieve full upright standing. minA for controlled descent to sit in recliner with RW, multimodal cues for safety, sequencing, hand placement, and RW proximity.    Ambulation/Gait   Gait Distance (Feet): 2 Feet           General Gait Details: CGA for safety to ambulate short distance from EOB>recliner with RW. Demonstrates slowed cadence, decreased step length/foot clearance bilaterally, and impaired RW management. Multimodal cues for safety and sequencing.    Balance Overall balance assessment: Needs assistance Sitting-balance support: Feet supported, Bilateral upper extremity supported Sitting balance-Leahy Scale: Fair     Standing balance support: During functional activity, Reliant on assistive device for balance, Bilateral upper extremity supported Standing balance-Leahy Scale: Fair Standing balance comment: standing balance in RW  Pertinent Vitals/Pain Pain Assessment Pain Assessment: 0-10 Pain Score: 7  Pain Location: BLE Pain Descriptors / Indicators: Sharp Pain Intervention(s): Limited activity within patient's tolerance, Repositioned, Premedicated  before session, Monitored during session    Home Living Family/patient expects to be discharged to:: Skilled nursing facility                   Additional Comments: Per chart review, pt resides at [facility]. Per pt report, she was supposed to start therapy, but has not yet. She wears 6L O2 via [place] at baseline and wears BiPAP at night.    Prior Function               Mobility Comments: Requires assistance with transfers to/from [facility] with RW; if unable to ambulate with RW staff assists with SPT. She denies needing assistance for bed mobility and/or seated balance. Denies recent falls. ADLs Comments: Requires assist with ADL's; able to provide some assist with UB ADL's. Staff assists with medication management and IADL's.     Extremity/Trunk Assessment   Upper Extremity Assessment Upper Extremity Assessment: Generalized weakness (limited shoulder A/PROM bilaterally (at baseline); elbow/wrist AROM WFL. limited finger extension AROM secondary to RA.)    Lower Extremity Assessment Lower Extremity Assessment: Generalized weakness (limited full TKE bil)    Cervical / Trunk Assessment Cervical / Trunk Assessment: Kyphotic  Communication   Communication Communication: No apparent difficulties    Cognition Arousal: Lethargic Behavior During Therapy: WFL for tasks assessed/performed                             Following commands: Intact       Cueing Cueing Techniques: Verbal cues, Tactile cues, Visual cues     General Comments General comments (skin integrity, edema, etc.): on 6L O2 via [place]; hemosiderin changes to BLE and trophic skin changes R>L    Exercises Other Exercises Other Exercises: Pt educated re: PT role/POC, DC recommendations, safety with functional mobility, OOB to chair for meals, OOB for toileting, call for help, pursed lip breathing.   Assessment/Plan    PT Assessment Patient needs continued PT services  PT Problem List Decreased  strength;Decreased range of motion;Decreased activity tolerance;Decreased balance;Decreased mobility;Obesity;Decreased skin integrity;Pain       PT Treatment Interventions DME instruction;Gait training;Functional mobility training;Therapeutic activities;Therapeutic exercise;Balance training;Patient/family education;Wheelchair mobility training    PT Goals (Current goals can be found in the Care Plan section)  Acute Rehab PT Goals Patient Stated Goal: get better PT Goal Formulation: With patient Time For Goal Achievement: 09/26/24 Potential to Achieve Goals: Good    Frequency Min 2X/week        AM-PAC PT 6 Clicks Mobility  Outcome Measure Help needed turning from your back to your side while in a flat bed without using bedrails?: A Lot Help needed moving from lying on your back to sitting on the side of a flat bed without using bedrails?: A Lot Help needed moving to and from a bed to a chair (including a wheelchair)?: A Little Help needed standing up from a chair using your arms (e.g., wheelchair or bedside chair)?: A Little Help needed to walk in hospital room?: A Little Help needed climbing 3-5 steps with a railing? : Total 6 Click Score: 14    End of Session Equipment Utilized During Treatment: Gait belt Activity Tolerance: Patient limited by pain Patient left: in chair;with call bell/phone within reach;with chair alarm set Nurse Communication: Mobility  status PT Visit Diagnosis: Unsteadiness on feet (R26.81);Muscle weakness (generalized) (M62.81)    Time: 8864-8797 PT Time Calculation (min) (ACUTE ONLY): 27 min   Charges:   PT Evaluation $PT Eval Moderate Complexity: 1 Mod PT Treatments $Therapeutic Activity: 8-22 mins PT General Charges $$ ACUTE PT VISIT: 1 Visit       Camie CHARLENA Kluver, PT, DPT 1:29 PM,09/12/24 Physical Therapist - [facility]

## 2024-09-12 NOTE — Assessment & Plan Note (Signed)
 Last hemoglobin 8.5

## 2024-09-12 NOTE — Progress Notes (Signed)
 Progress Note   Patient: [name] FMW:[id number] DOB: 09/22/1956 DOA: 09/11/2024     0 DOS: the patient was seen and examined on 09/12/2024   Brief hospital course: 68 y.o. female with medical history significant for COPD on 6 L nasal cannula oxygen at baseline, CHF, HTN, RA, history of PE on Xarelto , A-fib who was brought in to [facility] emergency room from nursing home for worsening shortness of breath and change in mental status.  Per EMS note, facility felt that patient was a bit more confused than usual for the last 3 or 4 days.  She was found on 6 L saturating in low 90s.  There was no history of fever, cough, chest pain, palpitations.  There was no history of trauma or falls.  Patient was taking all the medications as prescribed.  At facility patient was noticed to have more short of breath and leg swelling then she was transferred to [facility] ED. Patient denies any chest pain, nausea, vomiting.  She complained this morning that she had lot of aches and pains all over the body otherwise no other symptoms.  Patient was alert awake and was able to answer all the questions appropriately this morning.   ED Course: Upon arrival to the ED, patient is found to be A&O x 3, BNP 142, sodium 131, BUN 26, creatinine 1.65, white count 10.6.  EKG showed A-fib, chest x-ray showed no pneumonia.  CT head without any acute abnormalities.  UA shows no signs of UTI.  Patient received IV Lasix  40 mg and hospitalist service was consulted for evaluation for admission.  9/28.  Patient still not feeling well.  Still with some shortness of breath.  Baseline oxygen 6 L and wears BiPAP at night.   Assessment and Plan: Acute hypoxic on chronic hypercapnic respiratory failure (HCC) 1 pulse ox of 87% on her usual oxygen and required Venturi mask.  Patient was on 7 L this morning and baseline on 6 L.  I dialed her down to 6 L.  Last use BiPAP at night with elevation of pCO2.  Will have care manager to make sure she has a BiPAP  at her facility.  Acute on chronic heart failure with preserved ejection fraction (HFpEF) (HCC) Lasix  40 mg IV twice daily, Jardiance .  Last echo 55% EF  Acute kidney injury superimposed on CKD AKI on CKD stage IIIa.  Creatinine 1.65 on presentation and a few months ago was 1.16.  Monitor closely with diuresis.  Chronic obstructive pulmonary disease (HCC) Does not seem to be in exacerbation at this time.  Continue prednisone  5 mg daily  History of pulmonary embolism On Xarelto   Acute metabolic encephalopathy Seems improved likely secondary to CO2 retention.  Hyponatremia Likely secondary to heart failure  Anemia of chronic disease Hemoglobin 8.3.  Check hemoglobin tomorrow.  Rheumatoid arthritis (HCC) On Plaquenil   Essential hypertension On Cardizem   Hypothyroidism On levothyroxine         Subjective: Patient not feeling too good.  Sent in with altered mental status and shortness of breath  Physical Exam: Vitals:   09/11/24 2311 09/12/24 0500 09/12/24 0827 09/12/24 1207  BP: 98/72  (!) 104/57 100/66  Pulse: 72  93 94  Resp: 20  18 20   Temp: 98.9 F (37.2 C)  98 F (36.7 C) 99.8 F (37.7 C)  TempSrc: Oral   Oral  SpO2: 97%  95% 95%  Weight:  94.3 kg    Height:       Physical Exam HENT:  Head: Normocephalic.  Eyes:     General: Lids are normal.  Cardiovascular:     Rate and Rhythm: Normal rate and regular rhythm.     Heart sounds: Normal heart sounds, S1 normal and S2 normal.  Pulmonary:     Breath sounds: Examination of the right-lower field reveals decreased breath sounds. Examination of the left-lower field reveals decreased breath sounds. Decreased breath sounds present. No wheezing, rhonchi or rales.  Abdominal:     Palpations: Abdomen is soft.     Tenderness: There is no abdominal tenderness.  Musculoskeletal:     Right lower leg: Swelling present.     Left lower leg: Swelling present.  Skin:    General: Skin is warm.     Findings: No  rash.  Neurological:     Mental Status: She is alert and oriented to person, place, and time.     Data Reviewed: Sodium 133, creatinine 1.57, hemoglobin 8.3  Family Communication: Spoke with son on the phone  Disposition: Status is: Observation Continue diuresis today.  Patient not feeling well.  Need to confirm that she has a BiPAP at her facility and not a CPAP.  Spoke with patient's son about palliative care and patient has declined this in the past.  Planned Discharge Destination: Rehab    Time spent: 28 minutes  Author: [name], MD 09/12/2024 1:03 PM  For on call review [email or web address].

## 2024-09-12 NOTE — Evaluation (Signed)
 Occupational Therapy Evaluation Patient Details Name: [name] MRN: [id number] DOB: [birth year] Today's Date: 09/12/2024   History of Present Illness   Pt admitted to [facility] on 09/11/24 from [facility] under observation for SOB, worsening BLE edema, and AMS secondary to CHF. Significant PMH includes: RA, HTN, CHF, COPD (6L at baseline), hx of PE, hypothyroidism, Afib (Xarelto ), chronic venous stasis.     Clinical Impressions [name] presents this date with generalized weakness, limited endurance, pain, imbalance, and SOB. At baseline, she is a LTC patient at [facility]. She reports that she has been IND in supine<>sit transfers and is able to assist with bed<>WC transfers. During today's evaluation, she required Max A for all bed mobility and was unable to perform OOB activity, citing pain and fatigue. She endorses 9/10 pain in bilateral LE at rest, no pain in UE at rest, but high level of b/l UE pain with any movement. Pt received in darkened room with blinds closed. With pt's permission, opened blinds; encouraged her to establish a routine that distinguishes day from night while hospitalized. Pt's current fxl mobility and ability to engage in ADLs is currently off from her baseline. Recommend ongoing OT while hospitalized, with return to prior residence with regular OT, 5 days/wk, <3 hrs/day, to assist pt in managing pain, returning to PLOF, and reducing caregiver burden.     If plan is discharge home, recommend the following:   A lot of help with walking and/or transfers;A lot of help with bathing/dressing/bathroom;Assistance with feeding;Assist for transportation;Assistance with cooking/housework     Functional Status Assessment   Patient has had a recent decline in their functional status and demonstrates the ability to make significant improvements in function in a reasonable and predictable amount of time.     Equipment Recommendations   None recommended by OT      Recommendations for Other Services         Precautions/Restrictions   Precautions Precautions: Fall Restrictions Weight Bearing Restrictions Per Provider Order: No     Mobility Bed Mobility Overal bed mobility: Needs Assistance Bed Mobility: Rolling Rolling: Max assist              Transfers Overall transfer level: Needs assistance                 General transfer comment: Pt unable, citing pain and fatigue      Balance       Sitting balance - Comments: NT       Standing balance comment: NT                           ADL either performed or assessed with clinical judgement   ADL Overall ADL's : Needs assistance/impaired                                       General ADL Comments: Pt requiring Max A for all ADLs this date     Vision         Perception         Praxis         Pertinent Vitals/Pain Pain Assessment Pain Score: 8  Pain Location: BLE Pain Descriptors / Indicators: Sharp, Aching Pain Intervention(s): Monitored during session, Repositioned     Extremity/Trunk Assessment Upper Extremity Assessment Upper Extremity Assessment: Generalized weakness   Lower Extremity Assessment Lower Extremity  Assessment: Generalized weakness   Cervical / Trunk Assessment Cervical / Trunk Assessment: Kyphotic   Communication Communication Communication: No apparent difficulties   Cognition Arousal: Lethargic Behavior During Therapy: WFL for tasks assessed/performed                                 Following commands: Intact       Cueing  General Comments      on 6L O2 via [place]; hemosiderin changes to BLE and trophic skin changes R>L   Exercises Other Exercises Other Exercises: Educ re: POC, DC recs, importance of OOB mobility   Shoulder Instructions      Home Living Family/patient expects to be discharged to:: Skilled nursing facility                                  Additional Comments: Per chart review, pt resides at [facility]. Per pt report, she was supposed to start therapy, but has not yet. She wears 6L O2 via [place] at baseline and wears BiPAP at night.      Prior Functioning/Environment Prior Level of Function : Needs assist             Mobility Comments: Requires assistance with transfers to/from [facility] with RW; if unable to ambulate with RW staff assists with SPT. She denies needing assistance for bed mobility and/or seated balance. Denies recent falls. ADLs Comments: Requires assist with ADL's; able to provide some assist with UB ADL's. Staff assists with medication management and IADL's.    OT Problem List: Impaired balance (sitting and/or standing);Pain;Decreased strength;Decreased range of motion;Decreased activity tolerance;Cardiopulmonary status limiting activity;Increased edema   OT Treatment/Interventions: Self-care/ADL training;Therapeutic exercise;Patient/family education;Balance training;Energy conservation;Therapeutic activities;DME and/or AE instruction      OT Goals(Current goals can be found in the care plan section)   Acute Rehab OT Goals Patient Stated Goal: to feel better OT Goal Formulation: With patient Time For Goal Achievement: 09/26/24 Potential to Achieve Goals: Fair ADL Goals Pt/caregiver will Perform Home Exercise Program: Increased ROM;Increased strength;Both right and left upper extremity;With Supervision (to maintain strength and flexibility and improve pain mgmt) Additional ADL Goal #1: Pt will perform supine<>sit bed mobility with SUPV Additional ADL Goal #2: Pt will transfer bed<>WC with SUPV   OT Frequency:  Min 1X/week    Co-evaluation              AM-PAC OT 6 Clicks Daily Activity     Outcome Measure Help from another person eating meals?: A Little Help from another person taking care of personal grooming?: A Lot Help from another person toileting, which includes using toliet, bedpan, or  urinal?: A Lot Help from another person bathing (including washing, rinsing, drying)?: A Lot Help from another person to put on and taking off regular upper body clothing?: A Lot Help from another person to put on and taking off regular lower body clothing?: A Lot 6 Click Score: 13   End of Session Equipment Utilized During Treatment: Oxygen  Activity Tolerance: Patient limited by lethargy;Patient limited by pain;Other (comment) (Pt limited by SOB) Patient left: in bed;with call bell/phone within reach;with bed alarm set  OT Visit Diagnosis: Unsteadiness on feet (R26.81);Other abnormalities of gait and mobility (R26.89);Muscle weakness (generalized) (M62.81);Pain Pain - Right/Left: Right (bilateral) Pain - part of body: Leg  Time: 8442-8394 OT Time Calculation (min): 8 min Charges:  OT General Charges $OT Visit: 1 Visit OT Evaluation $OT Eval Moderate Complexity: 1 Mod Suzen Hock, PhD, MS, OTR/L 09/12/24, 4:18 PM

## 2024-09-12 NOTE — Assessment & Plan Note (Signed)
 Last sodium normal range

## 2024-09-12 NOTE — Plan of Care (Signed)
  Problem: Clinical Measurements: Goal: Ability to maintain clinical measurements within normal limits will improve Outcome: Progressing   Problem: Activity: Goal: Risk for activity intolerance will decrease Outcome: Progressing   Problem: Safety: Goal: Ability to remain free from injury will improve Outcome: Progressing   Problem: Pain Managment: Goal: General experience of comfort will improve and/or be controlled Outcome: Progressing   Problem: Clinical Measurements: Goal: Respiratory complications will improve Outcome: Progressing

## 2024-09-12 NOTE — Assessment & Plan Note (Signed)
-  On levothyroxine

## 2024-09-12 NOTE — Assessment & Plan Note (Signed)
 AKI on CKD stage IIIa.  Creatinine 1.65 on presentation and a few months ago was 1.16.  With diuresis creatinine came down to 1.17.  Today's creatinine 1.41.  Can switch to usual oral lasix .  Check bmp one week.

## 2024-09-12 NOTE — TOC CM/SW Note (Signed)
..  Transition of Care [facility]) - Inpatient Brief Assessment   Patient Details  Name: [name] MRN: [id number] Date of Birth: Aug 03, 1956  Transition of Care [facility]) CM/SW Contact:    [name], LCSW Phone Number: 09/12/2024, 12:50 PM   Clinical Narrative:  TOC reached out to [facility] regarding Bipap or Trilogy for pt.  Order is in chart. Waiting on response    Transition of Care Asessment:

## 2024-09-12 NOTE — Assessment & Plan Note (Addendum)
 Lasix  40 mg IV twice daily, Jardiance .  Last echo 55% EF

## 2024-09-12 NOTE — Assessment & Plan Note (Signed)
On Cardizem 

## 2024-09-12 NOTE — Assessment & Plan Note (Signed)
 Does not seem to be in exacerbation at this time.  Continue prednisone  5 mg daily

## 2024-09-13 DIAGNOSIS — J9601 Acute respiratory failure with hypoxia: Secondary | ICD-10-CM | POA: Diagnosis not present

## 2024-09-13 DIAGNOSIS — J439 Emphysema, unspecified: Secondary | ICD-10-CM | POA: Diagnosis not present

## 2024-09-13 DIAGNOSIS — Z86711 Personal history of pulmonary embolism: Secondary | ICD-10-CM

## 2024-09-13 DIAGNOSIS — D638 Anemia in other chronic diseases classified elsewhere: Secondary | ICD-10-CM

## 2024-09-13 DIAGNOSIS — I5033 Acute on chronic diastolic (congestive) heart failure: Secondary | ICD-10-CM | POA: Diagnosis not present

## 2024-09-13 DIAGNOSIS — G9341 Metabolic encephalopathy: Secondary | ICD-10-CM

## 2024-09-13 DIAGNOSIS — N179 Acute kidney failure, unspecified: Secondary | ICD-10-CM | POA: Diagnosis not present

## 2024-09-13 LAB — BASIC METABOLIC PANEL WITH GFR
Anion gap: 11 (ref 5–15)
BUN: 19 mg/dL (ref 8–23)
CO2: 33 mmol/L — ABNORMAL HIGH (ref 22–32)
Calcium: 9 mg/dL (ref 8.9–10.3)
Chloride: 96 mmol/L — ABNORMAL LOW (ref 98–111)
Creatinine, Ser: 1.17 mg/dL — ABNORMAL HIGH (ref 0.44–1.00)
GFR, Estimated: 51 mL/min — ABNORMAL LOW (ref >60)
Glucose, Bld: 81 mg/dL (ref 70–99)
Potassium: 3.6 mmol/L (ref 3.5–5.1)
Sodium: 139 mmol/L (ref 135–145)

## 2024-09-13 LAB — HEMOGLOBIN: Hemoglobin: 8.5 g/dL — ABNORMAL LOW (ref 12.0–15.0)

## 2024-09-13 MED ORDER — MORPHINE SULFATE (PF) 2 MG/ML IV SOLN
1.0000 mg | INTRAVENOUS | Status: DC | PRN
Start: 2024-09-13 — End: 2024-09-15
  Administered 2024-09-13 – 2024-09-14 (×5): 1 mg via INTRAVENOUS
  Filled 2024-09-13 (×5): qty 1

## 2024-09-13 MED ORDER — POTASSIUM CHLORIDE CRYS ER 20 MEQ PO TBCR
20.0000 meq | EXTENDED_RELEASE_TABLET | Freq: Two times a day (BID) | ORAL | Status: DC
  Administered 2024-09-13 – 2024-09-14 (×3): 20 meq via ORAL
  Filled 2024-09-13 (×3): qty 1

## 2024-09-13 NOTE — TOC Progression Note (Signed)
 Transition of Care [facility]) - Progression Note    Patient Details  Name: [name] MRN: [id number] Date of Birth: 03/02/1956  [facility]) CM/SW Contact  Marinda Cooks, RN Phone Number: 09/13/2024, 3:58 PM  Clinical Narrative:    Per covering MD pt is not medically cleared to dc today. This CM spoke with Admission liaison at [facility] and confirmed pt can dc to facility when medically cleared and confirmed pt's Bi-pap in at facility . TOC will cont to follow dc planning/ care coordination and update as applicable.    Expected Discharge Plan and Services  [facility]      Social Drivers of Health (SDOH) Interventions SDOH Screenings   Food Insecurity: No Food Insecurity (09/11/2024)  Housing: Low Risk  (09/11/2024)  Transportation Needs: No Transportation Needs (09/11/2024)  Utilities: Not At Risk (09/11/2024)  Financial Resource Strain: High Risk (05/24/2024)  Social Connections: Socially Isolated (09/11/2024)  Tobacco Use: Medium Risk (09/11/2024)    Readmission Risk Interventions     No data to display

## 2024-09-13 NOTE — Progress Notes (Signed)
 Progress Note   Patient: [name] FMW:[id number] DOB: Dec 27, 1955 DOA: 09/11/2024     1 DOS: the patient was seen and examined on 09/13/2024   Brief hospital course: 68 y.o. female with medical history significant for COPD on 6 L nasal cannula oxygen at baseline, CHF, HTN, RA, history of PE on Xarelto , A-fib who was brought in to [facility] emergency room from nursing home for worsening shortness of breath and change in mental status.  Per EMS note, facility felt that patient was a bit more confused than usual for the last 3 or 4 days.  She was found on 6 L saturating in low 90s.  There was no history of fever, cough, chest pain, palpitations.  There was no history of trauma or falls.  Patient was taking all the medications as prescribed.  At facility patient was noticed to have more short of breath and leg swelling then she was transferred to [facility] ED. Patient denies any chest pain, nausea, vomiting.  She complained this morning that she had lot of aches and pains all over the body otherwise no other symptoms.  Patient was alert awake and was able to answer all the questions appropriately this morning.   ED Course: Upon arrival to the ED, patient is found to be A&O x 3, BNP 142, sodium 131, BUN 26, creatinine 1.65, white count 10.6.  EKG showed A-fib, chest x-ray showed no pneumonia.  CT head without any acute abnormalities.  UA shows no signs of UTI.  Patient received IV Lasix  40 mg and hospitalist service was consulted for evaluation for admission.  9/28.  Patient still not feeling well.  Still with some shortness of breath.  Baseline oxygen 6 L and wears BiPAP at night.   Assessment and Plan: Acute hypoxic on chronic hypercapnic respiratory failure (HCC) 1 pulse ox of 87% on her usual oxygen and required Venturi mask.  Patient was on 7 L yesterday morning and baseline on 6 L.  Last use BiPAP at night and while taking naps with elevation of pCO2.  Will have care manager to make sure she has a BiPAP  at her facility.  Acute on chronic heart failure with preserved ejection fraction (HFpEF) (HCC) Lasix  40 mg IV twice daily, Jardiance .  Last echo 55% EF  Acute kidney injury superimposed on CKD AKI on CKD stage IIIa.  Creatinine 1.65 on presentation and a few months ago was 1.16.  With diuresis creatinine came down to 1.17.  Continue diuresis today.  Chronic obstructive pulmonary disease (HCC) Does not seem to be in exacerbation at this time.  Continue prednisone  5 mg daily  History of pulmonary embolism On Xarelto   Acute metabolic encephalopathy Seems improved likely secondary to CO2 retention.  Hyponatremia Last sodium normal range  Anemia of chronic disease Hemoglobin 8.5.   Rheumatoid arthritis (HCC) On Plaquenil   Essential hypertension On Cardizem   Hypothyroidism On levothyroxine         Subjective: Patient seen this morning while on BiPAP.  Patient feels her breathing is better.  Creatinine improved with diuresis.  Patient agreeable to palliative care consultation.  Physical Exam: Vitals:   09/13/24 0348 09/13/24 0513 09/13/24 0748 09/13/24 1208  BP:   (!) 116/54 110/66  Pulse: 77  74 80  Resp:   20 19  Temp:   98.3 F (36.8 C) 98.9 F (37.2 C)  TempSrc:   Oral Oral  SpO2: 99%  100% 98%  Weight:  94.9 kg    Height:  Physical Exam HENT:     Head: Normocephalic.  Eyes:     General: Lids are normal.  Cardiovascular:     Rate and Rhythm: Normal rate and regular rhythm.     Heart sounds: Normal heart sounds, S1 normal and S2 normal.  Pulmonary:     Breath sounds: Examination of the right-lower field reveals decreased breath sounds. Examination of the left-lower field reveals decreased breath sounds. Decreased breath sounds present. No wheezing, rhonchi or rales.  Abdominal:     Palpations: Abdomen is soft.     Tenderness: There is no abdominal tenderness.  Musculoskeletal:     Right lower leg: Swelling present.     Left lower leg: Swelling  present.  Skin:    General: Skin is warm.     Findings: No rash.  Neurological:     Mental Status: She is alert.     Data Reviewed: Creatinine improved to 1.17, potassium 3.6, hemoglobin 8.5 Family Communication: Spoke with son on the phone  Disposition: Status is: Inpatient Remains inpatient appropriate because: Patient agreeable to palliative care consultation.  Patient chronically on 6 L and BiPAP at night.  Planned Discharge Destination: Rehab    Time spent: 28 minutes  Author: [name], MD 09/13/2024 12:08 PM  For on call review [email or web address].

## 2024-09-13 NOTE — Progress Notes (Signed)
 Heart Failure Navigator Progress Note  Assessed for Heart & Vascular TOC clinic readiness.  Patient does not meet criteria due to current Advanced Heart Failure Team patient of Ellouise Class, FNP. Hospital follow-up appointment scheduled for 09/20/24 @ 1:30 PM. Patient aware and appointment card was given for facility. Patient has been at [facility] resources and missed her last appointment.    Navigator will sign off at this time.  Charmaine Pines, RN, BSN [facility] Heart Failure Navigator Secure Chat Only

## 2024-09-14 DIAGNOSIS — E669 Obesity, unspecified: Secondary | ICD-10-CM | POA: Insufficient documentation

## 2024-09-14 DIAGNOSIS — R0602 Shortness of breath: Secondary | ICD-10-CM | POA: Diagnosis not present

## 2024-09-14 DIAGNOSIS — I509 Heart failure, unspecified: Secondary | ICD-10-CM | POA: Diagnosis not present

## 2024-09-14 DIAGNOSIS — Z515 Encounter for palliative care: Secondary | ICD-10-CM

## 2024-09-14 DIAGNOSIS — I5033 Acute on chronic diastolic (congestive) heart failure: Secondary | ICD-10-CM | POA: Diagnosis not present

## 2024-09-14 DIAGNOSIS — J9601 Acute respiratory failure with hypoxia: Secondary | ICD-10-CM | POA: Diagnosis not present

## 2024-09-14 DIAGNOSIS — M7989 Other specified soft tissue disorders: Secondary | ICD-10-CM

## 2024-09-14 DIAGNOSIS — N179 Acute kidney failure, unspecified: Secondary | ICD-10-CM | POA: Diagnosis not present

## 2024-09-14 DIAGNOSIS — M79604 Pain in right leg: Secondary | ICD-10-CM | POA: Insufficient documentation

## 2024-09-14 DIAGNOSIS — J439 Emphysema, unspecified: Secondary | ICD-10-CM | POA: Diagnosis not present

## 2024-09-14 LAB — BLOOD CULTURE ID PANEL (REFLEXED) - BCID2

## 2024-09-14 LAB — BASIC METABOLIC PANEL WITH GFR
Anion gap: 13 (ref 5–15)
BUN: 24 mg/dL — ABNORMAL HIGH (ref 8–23)
CO2: 33 mmol/L — ABNORMAL HIGH (ref 22–32)
Calcium: 9 mg/dL (ref 8.9–10.3)
Chloride: 93 mmol/L — ABNORMAL LOW (ref 98–111)
Creatinine, Ser: 1.41 mg/dL — ABNORMAL HIGH (ref 0.44–1.00)
GFR, Estimated: 41 mL/min — ABNORMAL LOW (ref >60)
Glucose, Bld: 88 mg/dL (ref 70–99)
Potassium: 3.8 mmol/L (ref 3.5–5.1)
Sodium: 139 mmol/L (ref 135–145)

## 2024-09-14 MED ORDER — TRAMADOL HCL 50 MG PO TABS: 50.0000 mg | ORAL_TABLET | Freq: Four times a day (QID) | ORAL | 0 refills | Status: DC | PRN

## 2024-09-14 MED ORDER — FUROSEMIDE 40 MG PO TABS: 40.0000 mg | ORAL_TABLET | Freq: Two times a day (BID) | ORAL | Status: AC

## 2024-09-14 MED ORDER — DOXYCYCLINE HYCLATE 100 MG PO TABS
100.0000 mg | ORAL_TABLET | Freq: Two times a day (BID) | ORAL | Status: DC
  Administered 2024-09-14: 100 mg via ORAL
  Filled 2024-09-14: qty 1

## 2024-09-14 MED ORDER — DOXYCYCLINE HYCLATE 100 MG PO TABS: 100.0000 mg | ORAL_TABLET | Freq: Two times a day (BID) | ORAL | 0 refills | Status: DC

## 2024-09-14 NOTE — NC FL2 (Signed)
   MEDICAID FL2 LEVEL OF CARE FORM     IDENTIFICATION  Patient Name: [name] Birthdate: 01-29-1956 Sex: female Admission Date (Current Location): 09/11/2024  [facility] and [place] Number:  Chiropodist and Address:  [facility], [address] [place], [place] [address]      Provider Number: [id number]  Attending Physician Name and Address:  Josette Ade, MD  Relative Name and Phone Number:       Current Level of Care: Hospital Recommended Level of Care: Skilled Nursing Facility Prior Approval Number:    Date Approved/Denied:   PASRR Number: [id number]  Discharge Plan: SNF    Current Diagnoses: Patient Active Problem List   Diagnosis Date Noted   Obesity (BMI 30-39.9) 09/14/2024   Leg pain, diffuse, right 09/14/2024   Hyponatremia 09/12/2024   Acute metabolic encephalopathy 09/12/2024   Traumatic hematoma of left lower leg 06/30/2024   Syncope 06/24/2024   Anemia of chronic disease 06/23/2024   Acute hypoxic on chronic hypercapnic respiratory failure (HCC) 06/14/2024   ABLA (acute blood loss anemia) 06/14/2024   Open wound of left lower leg 06/14/2024   Fall 06/14/2024   Hypercapnic respiratory failure (HCC) 05/31/2024   Acute hypoxic respiratory failure (HCC) 05/30/2024   History of pulmonary embolism 05/21/2024   Obesity, Class III, BMI 40-49.9 (morbid obesity) 05/21/2024   Atrial fibrillation (HCC) 05/21/2024   Chronic anticoagulation 05/21/2024   Acute on chronic respiratory failure with hypoxia (HCC) 05/21/2024   Epistaxis 05/21/2024   History of DVT (deep vein thrombosis) 05/21/2024   CHF exacerbation (HCC) 05/21/2024   Acute on chronic heart failure with preserved ejection fraction (HFpEF) (HCC) 05/20/2024   Type 2 diabetes mellitus without complication, without long-term current use of insulin  (HCC) 05/28/2023   COVID-19 01/05/2021   Hypoxia    Chronic obstructive pulmonary disease (HCC)    Mitral  valve annular calcification 04/07/2018   Depression 04/07/2018   Diastolic heart failure (HCC) 02/10/2018   Family history of hemophilia B 12/02/2017   Healthcare maintenance 05/07/2017   Onychomycosis 05/07/2017   Tricompartment osteoarthritis of both knees 02/19/2017   Acute kidney injury superimposed on CKD 04/23/2015   Rheumatoid arthritis (HCC) 07/08/2008   Morbid obesity (HCC) 05/04/2008   Essential hypertension 05/04/2008   ALLERGIC RHINITIS 05/04/2008   GERD 05/04/2008   VARICOSE VEINS LOWER EXTREMITIES W/INFLAMMATION 11/02/2007   Hypothyroidism 08/18/2007    Orientation RESPIRATION BLADDER Height & Weight     Self, Time, Situation, Place  O2, Other (Comment) (Nasal Cannula 6 L. Bipap QHS: 10/5 at 40%.) Incontinent Weight: 207 lb 3.7 oz (94 kg) Height:  5' 2 (157.5 cm)  BEHAVIORAL SYMPTOMS/MOOD NEUROLOGICAL BOWEL NUTRITION STATUS   (None)  (None) Continent Diet (Heart healthy)  AMBULATORY STATUS COMMUNICATION OF NEEDS Skin   Limited Assist Verbally Bruising, Other (Comment) (Scratch marks, weeping.)                       Personal Care Assistance Level of Assistance  Bathing, Feeding, Dressing Bathing Assistance: Maximum assistance Feeding assistance: Limited assistance Dressing Assistance: Maximum assistance     Functional Limitations Info  Sight, Hearing, Speech Sight Info: Adequate Hearing Info: Adequate Speech Info: Adequate    SPECIAL CARE FACTORS FREQUENCY                       Contractures Contractures Info: Not present    Additional Factors Info  Code Status, Allergies, Isolation Precautions Code Status  Info: Full code Allergies Info: Latex, Oxycodone -acetaminophen , Penicillins, Strawberry Extract, Oxycodone -acetaminophen , Tyloxapol, Adhesive (Tape), Wound Dressing Adhesive     Isolation Precautions Info: Enteric precautions: ESBL     Current Medications (09/14/2024):  This is the current hospital active medication list Current  Facility-Administered Medications  Medication Dose Route Frequency Provider Last Rate Last Admin   acetaminophen  (TYLENOL ) tablet 650 mg  650 mg Oral Q6H PRN Paudel, Nena, MD   650 mg at 09/11/24 1015   atorvastatin  (LIPITOR) tablet 10 mg  10 mg Oral QHS Paudel, Nena, MD   10 mg at 09/13/24 2133   diltiazem  (CARDIZEM  CD) 24 hr capsule 120 mg  120 mg Oral Daily Paudel, Keshab, MD   120 mg at 09/14/24 0859   doxycycline  (VIBRA -TABS) tablet 100 mg  100 mg Oral Q12H Wieting, Richard, MD   100 mg at 09/14/24 1022   empagliflozin  (JARDIANCE ) tablet 10 mg  10 mg Oral QAC breakfast Paudel, Nena, MD   10 mg at 09/14/24 0858   ferrous sulfate  tablet 325 mg  325 mg Oral Q breakfast Paudel, Keshab, MD   325 mg at 09/14/24 0900   folic acid  (FOLVITE ) tablet 1 mg  1 mg Oral Daily Paudel, Keshab, MD   1 mg at 09/14/24 0859   furosemide  (LASIX ) injection 40 mg  40 mg Intravenous BID Paudel, Keshab, MD   40 mg at 09/14/24 0858   gabapentin  (NEURONTIN ) capsule 400 mg  400 mg Oral TID Paudel, Keshab, MD   400 mg at 09/14/24 0859   hydroxychloroquine  (PLAQUENIL ) tablet 200 mg  200 mg Oral Daily Paudel, Keshab, MD   200 mg at 09/14/24 0859   levothyroxine  (SYNTHROID ) tablet 150 mcg  150 mcg Oral QAC breakfast Paudel, Keshab, MD   150 mcg at 09/14/24 0541   melatonin tablet 5 mg  5 mg Oral QHS Paudel, Keshab, MD   5 mg at 09/13/24 2133   morphine  (PF) 2 MG/ML injection 1 mg  1 mg Intravenous Q3H PRN Josette Ade, MD   1 mg at 09/14/24 1022   pantoprazole  (PROTONIX ) EC tablet 40 mg  40 mg Oral Daily Paudel, Keshab, MD   40 mg at 09/14/24 0859   potassium chloride  SA (KLOR-CON  M) CR tablet 20 mEq  20 mEq Oral BID Josette Ade, MD   20 mEq at 09/14/24 9141   predniSONE  (DELTASONE ) tablet 5 mg  5 mg Oral Q breakfast Paudel, Keshab, MD   5 mg at 09/14/24 0858   rivaroxaban  (XARELTO ) tablet 20 mg  20 mg Oral Q breakfast Duncan, Hazel V, MD   20 mg at 09/14/24 9141   traMADol  (ULTRAM ) tablet 50 mg  50 mg Oral  Q6H PRN Josette Ade, MD   50 mg at 09/14/24 9140     Discharge Medications: Please see discharge summary for a list of discharge medications.  Relevant Imaging Results:  Relevant Lab Results:   Additional Information SS#: [id number]  Lauraine JAYSON Carpen, LCSW

## 2024-09-14 NOTE — Discharge Instructions (Signed)

## 2024-09-14 NOTE — Assessment & Plan Note (Signed)
 Unlikely blood clot being on xaralto.  Careful with tramadol  because it can cause altered mental status.  Will give one week of doxycycline  in case cellulitis.

## 2024-09-14 NOTE — Progress Notes (Signed)
 Patient being discharged to [facility].  Patient to be EMS transported.  IV removed with the catheter intact.  Discharge instructions and prescription information placed in the packet for discharge.  Report called to Lamanda at [facility].

## 2024-09-14 NOTE — Plan of Care (Signed)
  Problem: Education: Goal: Knowledge of General Education information will improve Description: Including pain rating scale, medication(s)/side effects and non-pharmacologic comfort measures 09/14/2024 1423 by Les Delon CROME, RN Outcome: Adequate for Discharge 09/14/2024 1011 by Les Delon CROME, RN Outcome: Progressing   Problem: Health Behavior/Discharge Planning: Goal: Ability to manage health-related needs will improve Outcome: Adequate for Discharge   Problem: Clinical Measurements: Goal: Ability to maintain clinical measurements within normal limits will improve Outcome: Adequate for Discharge Goal: Will remain free from infection Outcome: Adequate for Discharge Goal: Diagnostic test results will improve Outcome: Adequate for Discharge Goal: Respiratory complications will improve Outcome: Adequate for Discharge Goal: Cardiovascular complication will be avoided Outcome: Adequate for Discharge   Problem: Activity: Goal: Risk for activity intolerance will decrease Outcome: Adequate for Discharge   Problem: Coping: Goal: Level of anxiety will decrease Outcome: Adequate for Discharge   Problem: Elimination: Goal: Will not experience complications related to bowel motility Outcome: Adequate for Discharge Goal: Will not experience complications related to urinary retention 09/14/2024 1423 by Les Delon CROME, RN Outcome: Adequate for Discharge 09/14/2024 1011 by Les Delon CROME, RN Outcome: Progressing   Problem: Pain Managment: Goal: General experience of comfort will improve and/or be controlled Outcome: Adequate for Discharge   Problem: Safety: Goal: Ability to remain free from injury will improve Outcome: Adequate for Discharge   Problem: Skin Integrity: Goal: Risk for impaired skin integrity will decrease Outcome: Adequate for Discharge   Problem: Acute Rehab PT Goals(only PT should resolve) Goal: Pt Will Go Supine/Side To  Sit Outcome: Adequate for Discharge Goal: Patient Will Perform Sitting Balance Outcome: Adequate for Discharge Goal: Patient Will Transfer Sit To/From Stand Outcome: Adequate for Discharge Goal: Pt Will Perform Standing Balance Or Pre-Gait Outcome: Adequate for Discharge Goal: Pt Will Ambulate Outcome: Adequate for Discharge   Problem: Acute Rehab OT Goals (only OT should resolve) Goal: Pt/Caregiver Will Perform Home Exercise Program Outcome: Adequate for Discharge Goal: OT Additional ADL Goal #1 Outcome: Adequate for Discharge Goal: OT Additional ADL Goal #2 Outcome: Adequate for Discharge

## 2024-09-14 NOTE — Plan of Care (Signed)
  Problem: Education: Goal: Knowledge of General Education information will improve Description: Including pain rating scale, medication(s)/side effects and non-pharmacologic comfort measures Outcome: Progressing   Problem: Nutrition: Goal: Adequate nutrition will be maintained Outcome: Progressing   Problem: Elimination: Goal: Will not experience complications related to urinary retention Outcome: Progressing   

## 2024-09-14 NOTE — Care Management Important Message (Signed)
 Important Message  Patient Details  Name: [name] MRN: [id number] Date of Birth: Nov 16, 1956   Important Message Given:  Yes - Medicare IM     [name] 09/14/2024, 4:46 PM

## 2024-09-14 NOTE — TOC Initial Note (Signed)
 Transition of Care [facility]) - Initial/Assessment Note    Patient Details  Name: [name] MRN: [id number] Date of Birth: November 04, 1956  [facility] CM/SW Contact:    Lauraine JAYSON Carpen, LCSW Phone Number: 09/14/2024, 12:55 PM  Clinical Narrative:  [facility] confirmed that patient is a long-term resident and she already has a bipap at the facility.                Expected Discharge Plan: Skilled Nursing Facility Barriers to Discharge: Continued Medical Work up   Patient Goals and CMS Choice            Expected Discharge Plan and Services       Living arrangements for the past 2 months: Skilled Nursing Facility                                      Prior Living Arrangements/Services Living arrangements for the past 2 months: Skilled Nursing Facility Lives with:: Facility Resident Patient language and need for interpreter reviewed:: Yes        Need for Family Participation in Patient Care: Yes (Comment) Care giver support system in place?: Yes (comment)   Criminal Activity/Legal Involvement Pertinent to Current Situation/Hospitalization: No - Comment as needed  Activities of Daily Living   ADL Screening (condition at time of admission) Independently performs ADLs?: No Does the patient have a NEW difficulty with bathing/dressing/toileting/self-feeding that is expected to last >3 days?: Yes (Initiates electronic notice to provider for possible OT consult) Does the patient have a NEW difficulty with getting in/out of bed, walking, or climbing stairs that is expected to last >3 days?: Yes (Initiates electronic notice to provider for possible PT consult) Does the patient have a NEW difficulty with communication that is expected to last >3 days?: No Is the patient deaf or have difficulty hearing?: No Does the patient have difficulty seeing, even when wearing glasses/contacts?: No Does the patient have difficulty concentrating, remembering, or  making decisions?: No  Permission Sought/Granted                  Emotional Assessment       Orientation: : Oriented to Self, Oriented to Place, Oriented to  Time, Oriented to Situation Alcohol / Substance Use: Not Applicable Psych Involvement: No (comment)  Admission diagnosis:  Shortness of breath [R06.02] CHF (congestive heart failure) (HCC) [I50.9] Leg swelling [M79.89] Acute on chronic congestive heart failure, unspecified heart failure type (HCC) [I50.9] Patient Active Problem List   Diagnosis Date Noted   Obesity (BMI 30-39.9) 09/14/2024   Leg pain, diffuse, right 09/14/2024   Hyponatremia 09/12/2024   Acute metabolic encephalopathy 09/12/2024   Traumatic hematoma of left lower leg 06/30/2024   Syncope 06/24/2024   Anemia of chronic disease 06/23/2024   Acute hypoxic on chronic hypercapnic respiratory failure (HCC) 06/14/2024   ABLA (acute blood loss anemia) 06/14/2024   Open wound of left lower leg 06/14/2024   Fall 06/14/2024   Hypercapnic respiratory failure (HCC) 05/31/2024   Acute hypoxic respiratory failure (HCC) 05/30/2024   History of pulmonary embolism 05/21/2024   Obesity, Class III, BMI 40-49.9 (morbid obesity) 05/21/2024   Atrial fibrillation (HCC) 05/21/2024   Chronic anticoagulation 05/21/2024   Acute on chronic respiratory failure with hypoxia (HCC) 05/21/2024   Epistaxis 05/21/2024   History of DVT (deep vein thrombosis) 05/21/2024   CHF exacerbation (HCC) 05/21/2024   Acute  on chronic heart failure with preserved ejection fraction (HFpEF) (HCC) 05/20/2024   Type 2 diabetes mellitus without complication, without long-term current use of insulin  (HCC) 05/28/2023   COVID-19 01/05/2021   Hypoxia    Chronic obstructive pulmonary disease (HCC)    Mitral valve annular calcification 04/07/2018   Depression 04/07/2018   Diastolic heart failure (HCC) 02/10/2018   Family history of hemophilia B 12/02/2017   Healthcare maintenance 05/07/2017    Onychomycosis 05/07/2017   Tricompartment osteoarthritis of both knees 02/19/2017   Acute kidney injury superimposed on CKD 04/23/2015   Rheumatoid arthritis (HCC) 07/08/2008   Morbid obesity (HCC) 05/04/2008   Essential hypertension 05/04/2008   ALLERGIC RHINITIS 05/04/2008   GERD 05/04/2008   VARICOSE VEINS LOWER EXTREMITIES W/INFLAMMATION 11/02/2007   Hypothyroidism 08/18/2007   PCP:  [name] DASEN, PA-C Pharmacy:   [facility] [address] [place], [place] - [address] [place]. [place] [address] Phone: [phone] Fax: [phone]  [facility] - [facility] [address] [place] [address] Phone: [phone] Fax: [phone]  [facility] [place], [place] - [address] [place] [address] Phone: [phone] Fax: [phone]     Social Drivers of Health (SDOH) Social History: SDOH Screenings   Food Insecurity: No Food Insecurity (09/11/2024)  Housing: Low Risk  (09/11/2024)  Transportation Needs: No Transportation Needs (09/11/2024)  Utilities: Not At Risk (09/11/2024)  Financial Resource Strain: High Risk (05/24/2024)  Social Connections: Socially Isolated (09/11/2024)  Tobacco Use: Medium Risk (09/11/2024)   SDOH Interventions:     Readmission Risk Interventions     No data to display

## 2024-09-14 NOTE — Assessment & Plan Note (Signed)
 BMI: 37.9

## 2024-09-14 NOTE — Progress Notes (Signed)
 Tramadol  prescription in the packet for discharge.

## 2024-09-14 NOTE — Consult Note (Signed)
 Consultation Note Date: 09/14/2024 at 1000  Patient Name: [name]  DOB: 05/17/1956  MRN: [id number]  Age / Sex: 68 y.o., female  PCP: [name] ONEIDA DEVONNA Referring Physician: Josette Ade, MD  HPI/Patient Profile: 68 y.o. female  with past medical history of COPD on 6 L nasal cannula oxygen at baseline, CHF, HTN, RA, history of PE on Xarelto , A-fib  admitted from her nursing home on 09/11/2024 with of breath and change in mental status.  As per chart review, patient wears oxygen of 6 L nasal cannula at baseline and wears BiPAP at night.  Patient is being treated for acute hypoxic on chronic hypercapnic respiratory failure, acute on chronic heart failure with preserved ejection fraction (last echo 55%), acute kidney injury superimposed on CKD, COPD (continue prednisone  5 mg daily), and diffuse right leg pain.  PMT was consulted to support patient with goals of care discussions..   Clinical Assessment and Goals of Care: Extensive chart review completed prior to meeting patient including labs, vital signs, imaging, progress notes, orders, and available advanced directive documents from current and previous encounters. I then met with patient at bedside  to discuss diagnosis prognosis, GOC, EOL wishes, disposition and options.  I introduced Palliative Medicine as specialized medical care for people living with serious illness. It focuses on providing relief from the symptoms and stress of a serious illness. The goal is to improve quality of life for both the patient and the family.  We discussed a brief life review of the patient.  Patient shares she has a son and a daughter-in-law that help look after her.  She resides in a long-term care facility.  She shares that she was doing well, doing what I should be doing to take care of myself, participating in rehab, and overall feeling as though she was  improving PTA.  She shares this hospitalization came out of nowhere for me.  Discussed chronic nature of heart failure, COPD, and chronic respiratory failure.  Reviewed that the aim of treatment is not to cure her but to prevent exacerbations, hospitalizations, and extended time between these exacerbations.  We discussed patient's current illness and what it means in the larger context of patient's on-going co-morbidities.  She shares frustration that despite doing everything right she has landed back in the hospital.  I attempted to elicit values and goals of care important to the patient.  She speaks of feeling frustrated and punished by her daughter-in-law and son.  She shares they do not have a clear understanding that her chronic issues are not things that will be cured.  She speaks of wanting to drink a soda within moderation but that her family is adamantly against that.  Discussed importance of patient and her support system-family understanding the nature of her chronic diseases.  Additionally, discussed functional, nutritional, and cognitive status is significant indicators of her overall prognosis.  Patient shares she knows she has a lot of health issues on top of a lot of health issues but that she is  doing the best I can.  Therapeutic silence, active listening, and emotional support provided.  Advance directives CODE STATUS and boundaries of medical decision making reviewed.  Patient endorses that she is accepting of ventilatory support and all efforts to sustain her life in the event of a cardiopulmonary arrest.  However, she was clear that she would never be accepting of tracheostomy or PEG tube/long-term life support measures.  Discussed difference between living and being kept alive by machines.  She shares that in the event that patient is unable to speak for herself that she would defer to her son as her next of kin decision maker.  Patient is to be discharged back to [facility] today.  Order in place for patient to continue goals and boundaries of care discussions with support of outpatient palliative services to follow.  Above discussion conveyed to attending.  Primary Decision Maker PATIENT  Physical Exam Vitals reviewed.  Constitutional:      General: She is not in acute distress. HENT:     Head: Normocephalic.     Nose: Nose normal.     Mouth/Throat:     Mouth: Mucous membranes are moist.  Eyes:     Pupils: Pupils are equal, round, and reactive to light.  Pulmonary:     Effort: Pulmonary effort is normal.     Comments: [place] in place Abdominal:     Palpations: Abdomen is soft.  Skin:    General: Skin is warm and dry.     Coloration: Skin is pale.  Neurological:     Mental Status: She is alert and oriented to person, place, and time.  Psychiatric:        Mood and Affect: Mood normal.        Behavior: Behavior normal.     Palliative Assessment/Data: 60%     Thank you for this consult. Palliative medicine will continue to follow and assist holistically.   Visit includes: Detailed review of medical records (labs, imaging, vital signs), medically appropriate exam (mental status, respiratory, cardiac, skin), discussed with treatment team, counseling and educating patient, family and staff, documenting clinical information, medication management and coordination of care.  Signed by: Lamarr Gunner, DNP, FNP-BC Palliative Medicine   Please contact Palliative Medicine Team providers via [facility] for questions and concerns.

## 2024-09-14 NOTE — TOC Transition Note (Signed)
 Transition of Care [facility]) - Discharge Note   Patient Details  Name: [name] MRN: [id number] Date of Birth: May 29, 1956  [facility] CM/SW Contact:  Lauraine JAYSON Carpen, LCSW Phone Number: 09/14/2024, 3:09 PM   Clinical Narrative:  Patient has orders to discharge to [facility] today. RN has already called report. [facility] has been arranged and she is 9th on the list. No further concerns. CSW signing off.   Final next level of care: Skilled Nursing Facility Barriers to Discharge: Barriers Resolved   Patient Goals and CMS Choice            Discharge Placement   Existing PASRR number confirmed : 09/14/24          Patient chooses bed at: [facility] [place] Patient to be transferred to facility by: [facility] Name of family member notified: Delon Novak Patient and family notified of of transfer: 09/14/24  Discharge Plan and Services Additional resources added to the After Visit Summary for                                       Social Drivers of Health (SDOH) Interventions SDOH Screenings   Food Insecurity: No Food Insecurity (09/11/2024)  Housing: Low Risk  (09/11/2024)  Transportation Needs: No Transportation Needs (09/11/2024)  Utilities: Not At Risk (09/11/2024)  Financial Resource Strain: High Risk (05/24/2024)  Social Connections: Socially Isolated (09/11/2024)  Tobacco Use: Medium Risk (09/11/2024)     Readmission Risk Interventions     No data to display

## 2024-09-14 NOTE — Progress Notes (Signed)
 PHARMACY - PHYSICIAN COMMUNICATION CRITICAL VALUE ALERT - BLOOD CULTURE IDENTIFICATION (BCID)  Results for orders placed or performed during the hospital encounter of 09/11/24  Culture, blood (routine x 2)     Status: None (Preliminary result)   Collection Time: 09/11/24  1:02 AM   Specimen: BLOOD  Result Value Ref Range Status   Specimen Description BLOOD BLOOD RIGHT ARM  Final   Special Requests   Final    BOTTLES DRAWN AEROBIC AND ANAEROBIC Blood Culture adequate volume   Culture   Final    NO GROWTH 2 DAYS Performed at [facility], [address] [place], [place] [address]    Report Status PENDING  Incomplete  Culture, blood (routine x 2)     Status: None (Preliminary result)   Collection Time: 09/11/24  2:56 AM   Specimen: Right Antecubital; Blood  Result Value Ref Range Status   Specimen Description RIGHT ANTECUBITAL  Final   Special Requests   Final    BOTTLES DRAWN AEROBIC AND ANAEROBIC Blood Culture adequate volume   Culture  Setup Time   Final    GRAM POSITIVE COCCI AEROBIC BOTTLE ONLY Organism ID to follow CRITICAL RESULT CALLED TO, READ BACK BY AND VERIFIED WITHBETHA LOT Tykesha Konicki AT 9642 09/14/24 JG Performed at [facility], [address] [place], [place] [address]    Culture PENDING  Incomplete   Report Status PENDING  Incomplete  Blood Culture ID Panel (Reflexed)     Status: Abnormal   Collection Time: 09/11/24  2:56 AM  Result Value Ref Range Status   Enterococcus faecalis NOT DETECTED NOT DETECTED Final   Enterococcus Faecium NOT DETECTED NOT DETECTED Final   Listeria monocytogenes NOT DETECTED NOT DETECTED Final   Staphylococcus species DETECTED (A) NOT DETECTED Final    Comment: CRITICAL RESULT CALLED TO, READ BACK BY AND VERIFIED WITH:  Bartley Vuolo AT 9642 09/14/24 JG    Staphylococcus aureus (BCID) NOT DETECTED NOT DETECTED Final   Staphylococcus epidermidis DETECTED (A) NOT DETECTED Final    Comment: Methicillin (oxacillin)  resistant coagulase negative staphylococcus. Possible blood culture contaminant (unless isolated from more than one blood culture draw or clinical case suggests pathogenicity). No antibiotic treatment is indicated for blood  culture contaminants. CRITICAL RESULT CALLED TO, READ BACK BY AND VERIFIED WITH:  Lindsey Hommel AT 9642 09/14/24 JG    Staphylococcus lugdunensis NOT DETECTED NOT DETECTED Final   Streptococcus species NOT DETECTED NOT DETECTED Final   Streptococcus agalactiae NOT DETECTED NOT DETECTED Final   Streptococcus pneumoniae NOT DETECTED NOT DETECTED Final   Streptococcus pyogenes NOT DETECTED NOT DETECTED Final   A.calcoaceticus-baumannii NOT DETECTED NOT DETECTED Final   Bacteroides fragilis NOT DETECTED NOT DETECTED Final   Enterobacterales NOT DETECTED NOT DETECTED Final   Enterobacter cloacae complex NOT DETECTED NOT DETECTED Final   Escherichia coli NOT DETECTED NOT DETECTED Final   Klebsiella aerogenes NOT DETECTED NOT DETECTED Final   Klebsiella oxytoca NOT DETECTED NOT DETECTED Final   Klebsiella pneumoniae NOT DETECTED NOT DETECTED Final   Proteus species NOT DETECTED NOT DETECTED Final   Salmonella species NOT DETECTED NOT DETECTED Final   Serratia marcescens NOT DETECTED NOT DETECTED Final   Haemophilus influenzae NOT DETECTED NOT DETECTED Final   Neisseria meningitidis NOT DETECTED NOT DETECTED Final   Pseudomonas aeruginosa NOT DETECTED NOT DETECTED Final   Stenotrophomonas maltophilia NOT DETECTED NOT DETECTED Final   Candida albicans NOT DETECTED NOT DETECTED Final   Candida auris NOT DETECTED NOT  DETECTED Final   Candida glabrata NOT DETECTED NOT DETECTED Final   Candida krusei NOT DETECTED NOT DETECTED Final   Candida parapsilosis NOT DETECTED NOT DETECTED Final   Candida tropicalis NOT DETECTED NOT DETECTED Final   Cryptococcus neoformans/gattii NOT DETECTED NOT DETECTED Final   Methicillin resistance mecA/C DETECTED (A) NOT DETECTED Final    Comment:  CRITICAL RESULT CALLED TO, READ BACK BY AND VERIFIED WITHBETHA RANKIN DILLS AT 9642 09/14/24 JG Performed at [facility], [address] [place], [place] [address]     BCID Results: 1 (aerobic) of 4 bottles with Staph Epi, mecA/C detected.  Pt currently not on any abx.  WBC WNL.  Pt remains afebrile at this time.  Name of provider contacted: HILARIO Solian. MD   Changes to prescribed antibiotics required: Suspect possible contaminant, do not start any abx pending additional cx results.  RANKIN CANDIE DILLS, PharmD, MBA 09/14/2024 4:20 AM

## 2024-09-14 NOTE — Progress Notes (Addendum)
 Occupational Therapy Treatment Patient Details Name: [name] MRN: [id number] DOB: 06/23/1956 Today's Date: 09/14/2024   History of present illness Pt admitted to [facility] on 09/11/24 from [facility] under observation for SOB, worsening BLE edema, and AMS secondary to CHF. Significant PMH includes: RA, HTN, CHF, COPD (6L at baseline), hx of PE, hypothyroidism, Afib (Xarelto ), chronic venous stasis.   OT comments  Chart reviewed to date, pt greeted semi supine in bed. She is eager to mobilize. She reports she feels stronger than when she came in and has been participating in therapy at facility. Spo2 >88% on 5L via [place]. Pt is making progress towards goals, discharge recommendation remains appropriate.       If plan is discharge home, recommend the following:  A lot of help with walking and/or transfers;A lot of help with bathing/dressing/bathroom;Assistance with feeding;Assist for transportation;Assistance with cooking/housework   Equipment Recommendations  None recommended by OT    Recommendations for Other Services      Precautions / Restrictions Precautions Precautions: Fall Recall of Precautions/Restrictions: Intact Restrictions Weight Bearing Restrictions Per Provider Order: No       Mobility Bed Mobility Overal bed mobility: Needs Assistance Bed Mobility: Supine to Sit     Supine to sit: Contact guard, HOB elevated          Transfers Overall transfer level: Needs assistance Equipment used: Rolling walker (2 wheels) Transfers: Sit to/from Stand Sit to Stand: Contact guard assist, Min assist, From elevated surface                 Balance Overall balance assessment: Needs assistance Sitting-balance support: Feet supported, Bilateral upper extremity supported Sitting balance-Leahy Scale: Good     Standing balance support: During functional activity, Reliant on assistive device for balance, Bilateral upper extremity supported Standing balance-Leahy  Scale: Fair                             ADL either performed or assessed with clinical judgement   ADL Overall ADL's : Needs assistance/impaired     Grooming: Set up;Sitting   Upper Body Bathing: Maximal assistance Upper Body Bathing Details (indicate cue type and reason): washed lower back, provided moisturizing cream due to dry skni Lower Body Bathing: Maximal assistance       Lower Body Dressing: Maximal assistance           Tub/ Shower Transfer: Contact guard assist;Rolling walker (2 wheels) Tub/Shower Transfer Details (indicate cue type and reason): simulated to bedside chair, step pivot        Extremity/Trunk Assessment              Vision       Perception     Praxis     Communication Communication Communication: No apparent difficulties   Cognition Arousal: Alert Behavior During Therapy: WFL for tasks assessed/performed Cognition: No apparent impairments                               Following commands: Intact        Cueing   Cueing Techniques: Verbal cues, Tactile cues, Visual cues  Exercises Other Exercises Other Exercises: edu re role of OT, role of rehab, discharge recommendations    Shoulder Instructions       General Comments dry skin on lower back, BLE R>L    Pertinent Vitals/ Pain  Pain Assessment Pain Assessment: 0-10 Pain Score: 8  Pain Location: BLE Pain Descriptors / Indicators: Discomfort Pain Intervention(s): Monitored during session, Repositioned (pt declined asking nurse for pain meds)  Home Living                                          Prior Functioning/Environment              Frequency  Min 1X/week        Progress Toward Goals  OT Goals(current goals can now be found in the care plan section)  Progress towards OT goals: Progressing toward goals  Acute Rehab OT Goals Time For Goal Achievement: 09/26/24  Plan      Co-evaluation                  AM-PAC OT 6 Clicks Daily Activity     Outcome Measure   Help from another person eating meals?: A Little Help from another person taking care of personal grooming?: A Lot Help from another person toileting, which includes using toliet, bedpan, or urinal?: A Lot Help from another person bathing (including washing, rinsing, drying)?: A Lot Help from another person to put on and taking off regular upper body clothing?: A Lot Help from another person to put on and taking off regular lower body clothing?: A Lot 6 Click Score: 13    End of Session Equipment Utilized During Treatment: Oxygen;Rolling walker (2 wheels)  OT Visit Diagnosis: Unsteadiness on feet (R26.81);Other abnormalities of gait and mobility (R26.89);Muscle weakness (generalized) (M62.81);Pain   Activity Tolerance Patient tolerated treatment well   Patient Left in chair;with call bell/phone within reach;with chair alarm set   Nurse Communication Mobility status        Time: 8854-8787 OT Time Calculation (min): 27 min  Charges: OT General Charges $OT Visit: 1 Visit OT Treatments $Therapeutic Activity: 23-37 mins  Therisa Sheffield, OTD OTR/L  09/14/24, 1:08 PM

## 2024-09-14 NOTE — Discharge Summary (Signed)
 Physician Discharge Summary   Patient: [name] MRN: [id number] DOB: 1956/11/06  Admit date:     09/11/2024  Discharge date: 09/14/24  Discharge Physician: [name]   PCP: [name] DASEN, PA-C   Recommendations at discharge:    Doctor at facility 1 day Palliative car at facility  Discharge Diagnoses: Active Problems:   Acute hypoxic on chronic hypercapnic respiratory failure (HCC)   Acute on chronic heart failure with preserved ejection fraction (HFpEF) (HCC)   Acute kidney injury superimposed on CKD   Chronic obstructive pulmonary disease (HCC)   History of pulmonary embolism   Hypothyroidism   Essential hypertension   Rheumatoid arthritis (HCC)   Anemia of chronic disease   Hyponatremia   Acute metabolic encephalopathy   Obesity (BMI 30-39.9)   Leg pain, diffuse, right    Hospital Course: 68 y.o. female with medical history significant for COPD on 6 L nasal cannula oxygen at baseline, CHF, HTN, RA, history of PE on Xarelto , A-fib who was brought in to [facility] emergency room from nursing home for worsening shortness of breath and change in mental status.  Per EMS note, facility felt that patient was a bit more confused than usual for the last 3 or 4 days.  She was found on 6 L saturating in low 90s.  There was no history of fever, cough, chest pain, palpitations.  There was no history of trauma or falls.  Patient was taking all the medications as prescribed.  At facility patient was noticed to have more short of breath and leg swelling then she was transferred to [facility] ED. Patient denies any chest pain, nausea, vomiting.  She complained this morning that she had lot of aches and pains all over the body otherwise no other symptoms.  Patient was alert awake and was able to answer all the questions appropriately this morning.   ED Course: Upon arrival to the ED, patient is found to be A&O x 3, BNP 142, sodium 131, BUN 26, creatinine 1.65, white count 10.6.  EKG showed  A-fib, chest x-ray showed no pneumonia.  CT head without any acute abnormalities.  UA shows no signs of UTI.  Patient received IV Lasix  40 mg and hospitalist service was consulted for evaluation for admission.  9/28.  Patient still not feeling well.  Still with some shortness of breath.  Baseline oxygen 6 L and wears BiPAP at night.  9/29. Continue iv lasix  today 9/30.  Feeling better today.  Will discharge back to facility.  Assessment and Plan: Acute hypoxic on chronic hypercapnic respiratory failure (HCC) 1 pulse ox of 87% on her usual oxygen and required Venturi mask.  Patient was on 7 L yesterday 2 days ago and baseline on 6 L.  Last use BiPAP at night and while taking naps with elevation of pCO2.  Will have care manager to make sure she has a BiPAP at her facility.  This am on 5L oxygen.  Chronic oxygen for patient.  Acute on chronic heart failure with preserved ejection fraction (HFpEF) (HCC) Can switch iv lasix  back to PO, Jardiance .  Last echo 55% EF  Acute kidney injury superimposed on CKD AKI on CKD stage IIIa.  Creatinine 1.65 on presentation and a few months ago was 1.16.  With diuresis creatinine came down to 1.17.  Today's creatinine 1.41.  Can switch to usual oral lasix .  Check bmp one week.  Chronic obstructive pulmonary disease (HCC) Does not seem to be in exacerbation at this time.  Continue prednisone  5 mg daily  History of pulmonary embolism On Xarelto   Leg pain, diffuse, right Unlikely blood clot being on xaralto.  Careful with tramadol  because it can cause altered mental status.  Will give one week of doxycycline  in case cellulitis.  Obesity (BMI 30-39.9) BMI 37.9  Acute metabolic encephalopathy Much improved, likely secondary to CO2 retention.  Hyponatremia Last sodium normal range  Anemia of chronic disease Hemoglobin 8.5.   Rheumatoid arthritis (HCC) On Plaquenil   Essential hypertension On Cardizem   Hypothyroidism On levothyroxine    Staph  epidermidis one bottle of blood culture is contaminant, not infection.      Consultants: Palliative care Procedures performed: none Disposition: Rehabilitation facility Diet recommendation:  Cardiac diet DISCHARGE MEDICATION: Allergies as of 09/14/2024       Reactions   Latex Hives, Itching, Dermatitis   Blisters (also) Skin blisters   Oxycodone -acetaminophen  Nausea Only, Other (See Comments)   Violent Vomiting Violent Vomiting   Penicillins Anaphylaxis, Swelling, Rash   Has patient had a PCN reaction causing immediate rash, facial/tongue/throat swelling, SOB or lightheadedness with hypotension: Yes Has patient had a PCN reaction causing severe rash involving mucus membranes or skin necrosis: No Has patient had a PCN reaction that required hospitalization No Has patient had a PCN reaction occurring within the last 10 years: No If all of the above answers are NO, then may proceed with Cephalosporin use.   Strawberry Extract Hives, Itching, Rash, Anaphylaxis, Dermatitis, Swelling   Oxycodone -acetaminophen  Nausea And Vomiting   Tyloxapol Nausea And Vomiting   Adhesive [tape] Rash   Patient prefers paper tape   Wound Dressing Adhesive Dermatitis        Medication List     STOP taking these medications    sulfamethoxazole -trimethoprim  800-160 MG tablet Commonly known as: BACTRIM  DS       TAKE these medications    acetaminophen  500 MG tablet Commonly known as: TYLENOL  Take 500 mg by mouth every 8 (eight) hours as needed for mild pain (pain score 1-3).   atorvastatin  10 MG tablet Commonly known as: LIPITOR Take 10 mg by mouth at bedtime.   benzonatate 100 MG capsule Commonly known as: TESSALON Take 100 mg by mouth every 8 (eight) hours as needed for cough.   budesonide  0.5 MG/2ML nebulizer solution Commonly known as: PULMICORT  Take 0.5 mg by nebulization 2 (two) times daily.   Cyanocobalamin  1000 MCG Tbcr Take 1 tablet by mouth daily.   diltiazem  120 MG  tablet Commonly known as: CARDIZEM  Take 120 mg by mouth daily.   doxycycline  100 MG tablet Commonly known as: VIBRA -TABS Take 1 tablet (100 mg total) by mouth every 12 (twelve) hours.   empagliflozin  10 MG Tabs tablet Commonly known as: Jardiance  Take 1 tablet (10 mg total) by mouth daily before breakfast.   ferrous sulfate  325 (65 FE) MG EC tablet Take 325 mg by mouth daily with breakfast.   folic acid  1 MG tablet Commonly known as: FOLVITE  Take 1 mg by mouth daily.   furosemide  40 MG tablet Commonly known as: LASIX  Take 1 tablet (40 mg total) by mouth 2 (two) times daily.   gabapentin  400 MG capsule Commonly known as: NEURONTIN  Take 400 mg by mouth 3 (three) times daily.   hydroxychloroquine  200 MG tablet Commonly known as: PLAQUENIL  Take 200 mg by mouth daily.   ipratropium-albuterol  0.5-2.5 (3) MG/3ML Soln Commonly known as: DUONEB Take 3 mLs by nebulization every 6 (six) hours.   levothyroxine  150 MCG tablet Commonly known as: SYNTHROID   Take 150 mcg by mouth daily before breakfast.   lidocaine  5 % Commonly known as: LIDODERM  Place 2 patches onto the skin daily.   melatonin 5 MG Tabs Take 5 mg by mouth at bedtime.   montelukast  10 MG tablet Commonly known as: SINGULAIR  Take 10 mg by mouth daily.   multivitamin tablet Take 1 tablet by mouth daily.   oxymetazoline  0.05 % nasal spray Commonly known as: AFRIN Place 1 spray into both nostrils 2 (two) times daily.   pantoprazole  40 MG tablet Commonly known as: PROTONIX  Take 40 mg by mouth daily.   potassium chloride  SA 20 MEQ tablet Commonly known as: KLOR-CON  M Take 20 mEq by mouth 2 (two) times daily.   predniSONE  5 MG tablet Commonly known as: DELTASONE  Take 5 mg by mouth daily with breakfast.   rivaroxaban  20 MG Tabs tablet Commonly known as: XARELTO  Take 20 mg by mouth daily.   sennosides-docusate sodium  8.6-50 MG tablet Commonly known as: SENOKOT-S Take 2 tablets by mouth 2 (two) times  daily.   sertraline  25 MG tablet Commonly known as: ZOLOFT  Take 3 tablets (75 mg total) by mouth daily.   traMADol  50 MG tablet Commonly known as: ULTRAM  Take 1 tablet (50 mg total) by mouth every 6 (six) hours as needed for severe pain (pain score 7-10). What changed:  when to take this reasons to take this   Trelegy Ellipta 100-62.5-25 MCG/ACT Aepb Generic drug: Fluticasone-Umeclidin-Vilant Inhale 1 puff into the lungs daily.        Follow-up Information     [facility]. Go on 09/20/2024.   Specialty: Cardiology Why: Hospital Follow-Up 09/20/2024 @ 1:30 PM Please bring all medications to follow-up appointment [facility], [address] Free Valet Parking at the door Contact information: [phone] [place]  [phone]        doctor at facility Follow up in 1 day(s).                 Discharge Exam: Filed Weights   09/12/24 0500 09/13/24 0513 09/14/24 0435  Weight: 94.3 kg 94.9 kg 94 kg   Physical Exam HENT:     Head: Normocephalic.  Eyes:     General: Lids are normal.  Cardiovascular:     Rate and Rhythm: Normal rate and regular rhythm.     Heart sounds: Normal heart sounds, S1 normal and S2 normal.  Pulmonary:     Breath sounds: Examination of the right-lower field reveals decreased breath sounds. Examination of the left-lower field reveals decreased breath sounds. Decreased breath sounds present. No wheezing, rhonchi or rales.  Abdominal:     Palpations: Abdomen is soft.     Tenderness: There is no abdominal tenderness.  Musculoskeletal:     Right lower leg: Swelling present.     Left lower leg: Swelling present.  Skin:    General: Skin is warm.     Comments: Chronic lower extremity discoloration slight worse on right.  Neurological:     Mental Status: She is alert.      Condition at discharge: fair  The results of significant  diagnostics from this hospitalization (including imaging, microbiology, ancillary and laboratory) are listed below for reference.   Imaging Studies: CT Head Wo Contrast Result Date: 09/11/2024 CLINICAL DATA:  Altered mental status for 4 days. EXAM: CT HEAD WITHOUT CONTRAST TECHNIQUE: Contiguous axial images were obtained from the base of the skull through the vertex  without intravenous contrast. RADIATION DOSE REDUCTION: This exam was performed according to the departmental dose-optimization program which includes automated exposure control, adjustment of the mA and/or kV according to patient size and/or use of iterative reconstruction technique. COMPARISON:  June 14, 2024 FINDINGS: Brain: There is no evidence of acute infarction, hemorrhage, hydrocephalus, extra-axial collection or mass lesion/mass effect. There are areas of mildly decreased attenuation within the white matter tracts of the supratentorial brain, consistent with microvascular disease changes. An empty sella is noted. Vascular: No hyperdense vessel or unexpected calcification. Skull: Normal. Negative for fracture or focal lesion. Sinuses/Orbits: No acute finding. Other: None. IMPRESSION: 1. No acute intracranial abnormality. 2. Mild chronic white matter small vessel ischemic changes. 3. Empty sella. Further evaluation with nonemergent MRI is recommended. Electronically Signed   By: Suzen Dials M.D.   On: 09/11/2024 01:43   DG Chest 1 View Result Date: 09/11/2024 CLINICAL DATA:  Altered mental status for 4 days. EXAM: CHEST  1 VIEW COMPARISON:  June 16, 2024 FINDINGS: The cardiac silhouette is mildly enlarged and unchanged in size. Moderate severity calcification of the aortic arch is seen. There is stable moderate to marked severity elevation of the right hemidiaphragm. A stable 5 mm calcified lung nodule is seen within the lateral aspect of the mid right lung. No acute infiltrate, pleural effusion or pneumothorax is identified.  Radiopaque surgical clips are seen overlying the neck soft tissues and right upper quadrant. Multilevel degenerative changes are present throughout the thoracic spine. IMPRESSION: 1. Stable exam without acute or active cardiopulmonary disease. Electronically Signed   By: Suzen Dials M.D.   On: 09/11/2024 01:36    Microbiology: Results for orders placed or performed during the hospital encounter of 09/11/24  Culture, blood (routine x 2)     Status: None (Preliminary result)   Collection Time: 09/11/24  1:02 AM   Specimen: BLOOD  Result Value Ref Range Status   Specimen Description BLOOD BLOOD RIGHT ARM  Final   Special Requests   Final    BOTTLES DRAWN AEROBIC AND ANAEROBIC Blood Culture adequate volume   Culture   Final    NO GROWTH 3 DAYS Performed at [facility], [address], [place], [place] [address]    Report Status PENDING  Incomplete  Culture, blood (routine x 2)     Status: None (Preliminary result)   Collection Time: 09/11/24  2:56 AM   Specimen: Right Antecubital; Blood  Result Value Ref Range Status   Specimen Description RIGHT ANTECUBITAL  Final   Special Requests   Final    BOTTLES DRAWN AEROBIC AND ANAEROBIC Blood Culture adequate volume   Culture  Setup Time   Final    GRAM POSITIVE COCCI AEROBIC BOTTLE ONLY Organism ID to follow CRITICAL RESULT CALLED TO, READ BACK BY AND VERIFIED WITHBETHA RANKIN DILLS AT 9642 09/14/24 JG    Culture   Final    NO GROWTH 3 DAYS Performed at [facility], [address] [place], [place] [address]    Report Status PENDING  Incomplete  Blood Culture ID Panel (Reflexed)     Status: Abnormal   Collection Time: 09/11/24  2:56 AM  Result Value Ref Range Status   Enterococcus faecalis NOT DETECTED NOT DETECTED Final   Enterococcus Faecium NOT DETECTED NOT DETECTED Final   Listeria monocytogenes NOT DETECTED NOT DETECTED Final   Staphylococcus species DETECTED (A) NOT DETECTED Final    Comment: CRITICAL  RESULT CALLED TO, READ BACK BY AND VERIFIED  WITHBETHA RANKIN DILLS AT 9642 09/14/24 JG    Staphylococcus aureus (BCID) NOT DETECTED NOT DETECTED Final   Staphylococcus epidermidis DETECTED (A) NOT DETECTED Final    Comment: Methicillin (oxacillin) resistant coagulase negative staphylococcus. Possible blood culture contaminant (unless isolated from more than one blood culture draw or clinical case suggests pathogenicity). No antibiotic treatment is indicated for blood  culture contaminants. CRITICAL RESULT CALLED TO, READ BACK BY AND VERIFIED WITH:  NATHAN BELUE AT 9642 09/14/24 JG    Staphylococcus lugdunensis NOT DETECTED NOT DETECTED Final   Streptococcus species NOT DETECTED NOT DETECTED Final   Streptococcus agalactiae NOT DETECTED NOT DETECTED Final   Streptococcus pneumoniae NOT DETECTED NOT DETECTED Final   Streptococcus pyogenes NOT DETECTED NOT DETECTED Final   A.calcoaceticus-baumannii NOT DETECTED NOT DETECTED Final   Bacteroides fragilis NOT DETECTED NOT DETECTED Final   Enterobacterales NOT DETECTED NOT DETECTED Final   Enterobacter cloacae complex NOT DETECTED NOT DETECTED Final   Escherichia coli NOT DETECTED NOT DETECTED Final   Klebsiella aerogenes NOT DETECTED NOT DETECTED Final   Klebsiella oxytoca NOT DETECTED NOT DETECTED Final   Klebsiella pneumoniae NOT DETECTED NOT DETECTED Final   Proteus species NOT DETECTED NOT DETECTED Final   Salmonella species NOT DETECTED NOT DETECTED Final   Serratia marcescens NOT DETECTED NOT DETECTED Final   Haemophilus influenzae NOT DETECTED NOT DETECTED Final   Neisseria meningitidis NOT DETECTED NOT DETECTED Final   Pseudomonas aeruginosa NOT DETECTED NOT DETECTED Final   Stenotrophomonas maltophilia NOT DETECTED NOT DETECTED Final   Candida albicans NOT DETECTED NOT DETECTED Final   Candida auris NOT DETECTED NOT DETECTED Final   Candida glabrata NOT DETECTED NOT DETECTED Final   Candida krusei NOT DETECTED NOT DETECTED Final    Candida parapsilosis NOT DETECTED NOT DETECTED Final   Candida tropicalis NOT DETECTED NOT DETECTED Final   Cryptococcus neoformans/gattii NOT DETECTED NOT DETECTED Final   Methicillin resistance mecA/C DETECTED (A) NOT DETECTED Final    Comment: CRITICAL RESULT CALLED TO, READ BACK BY AND VERIFIED WITH:  NATHAN BELUE AT 0357 09/14/24 JG Performed at [facility], [address] [place], [place] [address]     Labs: CBC: Recent Labs  Lab 09/11/24 0102 09/12/24 0501 09/13/24 0745  WBC 10.6* 8.1  --   NEUTROABS 8.7*  --   --   HGB 8.7* 8.3* 8.5*  HCT 27.7* 26.9*  --   MCV 92.6 92.8  --   PLT 239 208  --    Basic Metabolic Panel: Recent Labs  Lab 09/11/24 0102 09/11/24 1320 09/12/24 0501 09/13/24 0745 09/14/24 0733  NA 131* 133*  --  139 139  K 4.4 3.7  --  3.6 3.8  CL 93* 94*  --  96* 93*  CO2 28 28  --  33* 33*  GLUCOSE 95 72  --  81 88  BUN 26* 24*  --  19 24*  CREATININE 1.65* 1.57*  --  1.17* 1.41*  CALCIUM  9.0 8.6*  --  9.0 9.0  MG  --   --  1.9  --   --    Liver Function Tests: Recent Labs  Lab 09/11/24 0102 09/11/24 1320  AST 17 16  ALT 15 12  ALKPHOS 70 66  BILITOT 0.5 0.6  PROT 7.4 7.0  ALBUMIN 3.0* 2.8*   CBG: No results for input(s): GLUCAP in the last 168 hours.  Discharge time spent: greater than 30 minutes.  Signed: [name], MD [facility] 09/14/2024

## 2024-09-16 LAB — CULTURE, BLOOD (ROUTINE X 2)
Culture  Setup Time: NO GROWTH
Culture: NO GROWTH
Report Status: NO GROWTH — AB
Special Requests: ADEQUATE
Special Requests: ADEQUATE

## 2024-09-17 ENCOUNTER — Telehealth: Payer: Self-pay | Admitting: Family

## 2024-09-17 NOTE — Telephone Encounter (Signed)
 Called to confirm/remind patient of their appointment at the [facility] on 09/20/24.   Appointment:   [x] Confirmed  [] Left mess   [] No answer/No voice mail  [] VM Full/unable to leave message  [] Phone not in service  Patient reminded to bring all medications and/or complete list.  Confirmed patient has transportation. Gave directions, instructed to utilize valet parking.

## 2024-09-20 ENCOUNTER — Encounter: Admitting: Family

## 2024-10-13 ENCOUNTER — Telehealth: Payer: Self-pay | Admitting: Family

## 2024-10-13 NOTE — Telephone Encounter (Signed)
 Called to confirm/remind patient of their appointment at the [facility] on 10/14/24.   Appointment:   [x] Confirmed  [] Left mess   [] No answer/No voice mail  [] VM Full/unable to leave message  [] Phone not in service  Patient reminded to bring all medications and/or complete list.  Confirmed patient has transportation. Gave directions, instructed to utilize valet parking.

## 2024-10-14 ENCOUNTER — Telehealth: Payer: Self-pay | Admitting: Family

## 2024-10-14 ENCOUNTER — Encounter: Admitting: Family

## 2024-10-14 NOTE — Telephone Encounter (Signed)
 Patient did not show for her [facility] appointment on 10/14/24.

## 2024-11-18 ENCOUNTER — Emergency Department

## 2024-11-18 ENCOUNTER — Other Ambulatory Visit: Payer: Self-pay

## 2024-11-18 ENCOUNTER — Observation Stay: Admission: EM | Admit: 2024-11-18 | Discharge: 2024-11-19 | Disposition: A | Discharge status: Skilled Nursing Facility

## 2024-11-18 DIAGNOSIS — Z91040 Latex allergy status: Secondary | ICD-10-CM | POA: Insufficient documentation

## 2024-11-18 DIAGNOSIS — J449 Chronic obstructive pulmonary disease, unspecified: Secondary | ICD-10-CM | POA: Diagnosis present

## 2024-11-18 DIAGNOSIS — J9622 Acute and chronic respiratory failure with hypercapnia: Secondary | ICD-10-CM | POA: Diagnosis not present

## 2024-11-18 DIAGNOSIS — J9692 Respiratory failure, unspecified with hypercapnia: Secondary | ICD-10-CM | POA: Diagnosis present

## 2024-11-18 DIAGNOSIS — K219 Gastro-esophageal reflux disease without esophagitis: Secondary | ICD-10-CM | POA: Diagnosis present

## 2024-11-18 DIAGNOSIS — I10 Essential (primary) hypertension: Secondary | ICD-10-CM | POA: Diagnosis present

## 2024-11-18 DIAGNOSIS — J9601 Acute respiratory failure with hypoxia: Principal | ICD-10-CM

## 2024-11-18 DIAGNOSIS — M069 Rheumatoid arthritis, unspecified: Secondary | ICD-10-CM | POA: Diagnosis present

## 2024-11-18 DIAGNOSIS — E66813 Obesity, class 3: Secondary | ICD-10-CM | POA: Diagnosis present

## 2024-11-18 DIAGNOSIS — Z6841 Body Mass Index (BMI) 40.0 and over, adult: Secondary | ICD-10-CM | POA: Insufficient documentation

## 2024-11-18 DIAGNOSIS — E039 Hypothyroidism, unspecified: Secondary | ICD-10-CM | POA: Insufficient documentation

## 2024-11-18 DIAGNOSIS — J9602 Acute respiratory failure with hypercapnia: Principal | ICD-10-CM | POA: Insufficient documentation

## 2024-11-18 LAB — CBC
HCT: 31.2 % — ABNORMAL LOW (ref 36.0–46.0)
Hemoglobin: 9.3 g/dL — ABNORMAL LOW (ref 12.0–15.0)
MCH: 29.3 pg (ref 26.0–34.0)
MCHC: 29.8 g/dL — ABNORMAL LOW (ref 30.0–36.0)
MCV: 98.4 fL (ref 80.0–100.0)
Platelets: 174 K/uL (ref 150–400)
RBC: 3.17 MIL/uL — ABNORMAL LOW (ref 3.87–5.11)
RDW: 15.2 % (ref 11.5–15.5)
WBC: 6.1 K/uL (ref 4.0–10.5)
nRBC: 0 % (ref 0.0–0.2)

## 2024-11-18 LAB — BASIC METABOLIC PANEL WITH GFR
Anion gap: 8 (ref 5–15)
BUN: 13 mg/dL (ref 8–23)
CO2: 31 mmol/L (ref 22–32)
Calcium: 9.2 mg/dL (ref 8.9–10.3)
Chloride: 102 mmol/L (ref 98–111)
Creatinine, Ser: 0.88 mg/dL (ref 0.44–1.00)
GFR, Estimated: >60 mL/min (ref >60)
Glucose, Bld: 79 mg/dL (ref 70–99)
Potassium: 4.3 mmol/L (ref 3.5–5.1)
Sodium: 140 mmol/L (ref 135–145)

## 2024-11-18 LAB — BLOOD GAS, VENOUS
Acid-Base Excess: 15.3 mmol/L — ABNORMAL HIGH (ref 0.0–2.0)
Bicarbonate: 42.4 mmol/L — ABNORMAL HIGH (ref 20.0–28.0)
O2 Saturation: 73.9 %
Patient temperature: 37
pCO2, Ven: 61 mmHg — ABNORMAL HIGH (ref 44–60)
pH, Ven: 7.45 — ABNORMAL HIGH (ref 7.25–7.43)
pO2, Ven: 44 mmHg (ref 32–45)

## 2024-11-18 LAB — TROPONIN T, HIGH SENSITIVITY
Troponin T High Sensitivity: 44 ng/L — ABNORMAL HIGH (ref 0–19)
Troponin T High Sensitivity: 44 ng/L — ABNORMAL HIGH (ref 0–19)

## 2024-11-18 LAB — MAGNESIUM: Magnesium: 2.1 mg/dL (ref 1.7–2.4)

## 2024-11-18 LAB — PRO BRAIN NATRIURETIC PEPTIDE: Pro Brain Natriuretic Peptide: 284 pg/mL (ref <300.0)

## 2024-11-18 MED ORDER — LORATADINE 10 MG PO TABS
10.0000 mg | ORAL_TABLET | Freq: Every day | ORAL | Status: DC
  Administered 2024-11-18 – 2024-11-19 (×2): 10 mg via ORAL
  Filled 2024-11-18 (×2): qty 1

## 2024-11-18 MED ORDER — SERTRALINE HCL 50 MG PO TABS
75.0000 mg | ORAL_TABLET | Freq: Every day | ORAL | Status: DC
  Administered 2024-11-18 – 2024-11-19 (×2): 75 mg via ORAL
  Filled 2024-11-18 (×2): qty 2

## 2024-11-18 MED ORDER — MELATONIN 5 MG PO TABS
5.0000 mg | ORAL_TABLET | Freq: Every day | ORAL | Status: DC
  Administered 2024-11-18: 5 mg via ORAL
  Filled 2024-11-18: qty 1

## 2024-11-18 MED ORDER — FUROSEMIDE 40 MG PO TABS
40.0000 mg | ORAL_TABLET | Freq: Two times a day (BID) | ORAL | Status: DC
  Administered 2024-11-18 – 2024-11-19 (×3): 40 mg via ORAL
  Filled 2024-11-18 (×3): qty 1

## 2024-11-18 MED ORDER — IPRATROPIUM-ALBUTEROL 0.5-2.5 (3) MG/3ML IN SOLN: 3.0000 mL | Freq: Four times a day (QID) | RESPIRATORY_TRACT | Status: DC | PRN

## 2024-11-18 MED ORDER — SENNOSIDES-DOCUSATE SODIUM 8.6-50 MG PO TABS
2.0000 | ORAL_TABLET | Freq: Two times a day (BID) | ORAL | Status: DC
  Administered 2024-11-19: 2 via ORAL
  Filled 2024-11-18 (×2): qty 2

## 2024-11-18 MED ORDER — SENNOSIDES-DOCUSATE SODIUM 8.6-50 MG PO TABS: 1.0000 | ORAL_TABLET | Freq: Every evening | ORAL | Status: DC | PRN

## 2024-11-18 MED ORDER — BUDESON-GLYCOPYRROL-FORMOTEROL 160-9-4.8 MCG/ACT IN AERO
2.0000 | INHALATION_SPRAY | Freq: Two times a day (BID) | RESPIRATORY_TRACT | Status: DC
  Administered 2024-11-18 – 2024-11-19 (×2): 2 via RESPIRATORY_TRACT
  Filled 2024-11-18: qty 5.9

## 2024-11-18 MED ORDER — BUDESONIDE 0.5 MG/2ML IN SUSP: 0.5000 mg | Freq: Two times a day (BID) | RESPIRATORY_TRACT | Status: DC | PRN

## 2024-11-18 MED ORDER — MONTELUKAST SODIUM 10 MG PO TABS
10.0000 mg | ORAL_TABLET | Freq: Every day | ORAL | Status: DC
  Administered 2024-11-18 – 2024-11-19 (×2): 10 mg via ORAL
  Filled 2024-11-18 (×2): qty 1

## 2024-11-18 MED ORDER — LEVOTHYROXINE SODIUM 175 MCG PO TABS
175.0000 ug | ORAL_TABLET | Freq: Every day | ORAL | Status: DC
  Administered 2024-11-19: 175 ug via ORAL
  Filled 2024-11-18 (×2): qty 1

## 2024-11-18 MED ORDER — IPRATROPIUM-ALBUTEROL 0.5-2.5 (3) MG/3ML IN SOLN
3.0000 mL | Freq: Four times a day (QID) | RESPIRATORY_TRACT | Status: DC
  Administered 2024-11-19 (×2): 3 mL via RESPIRATORY_TRACT
  Filled 2024-11-18 (×3): qty 3

## 2024-11-18 MED ORDER — RIVAROXABAN 20 MG PO TABS
20.0000 mg | ORAL_TABLET | Freq: Every day | ORAL | Status: DC
  Administered 2024-11-18 – 2024-11-19 (×2): 20 mg via ORAL
  Filled 2024-11-18 (×2): qty 1

## 2024-11-18 MED ORDER — ACETAMINOPHEN 325 MG PO TABS: 650.0000 mg | ORAL_TABLET | Freq: Four times a day (QID) | ORAL | Status: DC | PRN

## 2024-11-18 MED ORDER — ACETAMINOPHEN 650 MG RE SUPP: 650.0000 mg | Freq: Four times a day (QID) | RECTAL | Status: DC | PRN

## 2024-11-18 MED ORDER — GABAPENTIN 400 MG PO CAPS
400.0000 mg | ORAL_CAPSULE | Freq: Three times a day (TID) | ORAL | Status: DC
  Administered 2024-11-18 – 2024-11-19 (×3): 400 mg via ORAL
  Filled 2024-11-18 (×3): qty 1

## 2024-11-18 MED ORDER — ADULT MULTIVITAMIN W/MINERALS CH
1.0000 | ORAL_TABLET | Freq: Every day | ORAL | Status: DC
  Administered 2024-11-18 – 2024-11-19 (×2): 1 via ORAL
  Filled 2024-11-18 (×2): qty 1

## 2024-11-18 MED ORDER — HYDROXYCHLOROQUINE SULFATE 200 MG PO TABS
200.0000 mg | ORAL_TABLET | Freq: Every day | ORAL | Status: DC
  Administered 2024-11-18 – 2024-11-19 (×2): 200 mg via ORAL
  Filled 2024-11-18 (×2): qty 1

## 2024-11-18 MED ORDER — PANTOPRAZOLE SODIUM 40 MG PO TBEC
40.0000 mg | DELAYED_RELEASE_TABLET | Freq: Every day | ORAL | Status: DC
  Administered 2024-11-19: 40 mg via ORAL
  Filled 2024-11-18: qty 1

## 2024-11-18 MED ORDER — EMPAGLIFLOZIN 10 MG PO TABS
10.0000 mg | ORAL_TABLET | Freq: Every day | ORAL | Status: DC
  Administered 2024-11-19: 10 mg via ORAL
  Filled 2024-11-18: qty 1

## 2024-11-18 MED ORDER — FOLIC ACID 1 MG PO TABS
1.0000 mg | ORAL_TABLET | Freq: Every day | ORAL | Status: DC
  Administered 2024-11-18 – 2024-11-19 (×2): 1 mg via ORAL
  Filled 2024-11-18 (×2): qty 1

## 2024-11-18 MED ORDER — POTASSIUM CHLORIDE CRYS ER 20 MEQ PO TBCR
20.0000 meq | EXTENDED_RELEASE_TABLET | Freq: Two times a day (BID) | ORAL | Status: DC
  Administered 2024-11-18 – 2024-11-19 (×2): 20 meq via ORAL
  Filled 2024-11-18 (×2): qty 1

## 2024-11-18 MED ORDER — DILTIAZEM HCL 60 MG PO TABS
120.0000 mg | ORAL_TABLET | Freq: Every day | ORAL | Status: DC
  Administered 2024-11-18 – 2024-11-19 (×2): 120 mg via ORAL
  Filled 2024-11-18: qty 2
  Filled 2024-11-18: qty 4

## 2024-11-18 MED ORDER — ATORVASTATIN CALCIUM 20 MG PO TABS
10.0000 mg | ORAL_TABLET | Freq: Every day | ORAL | Status: DC
  Administered 2024-11-18: 10 mg via ORAL
  Filled 2024-11-18: qty 1

## 2024-11-18 MED ORDER — FUROSEMIDE 10 MG/ML IJ SOLN
40.0000 mg | Freq: Once | INTRAMUSCULAR | Status: AC
  Administered 2024-11-18: 40 mg via INTRAVENOUS
  Filled 2024-11-18: qty 4

## 2024-11-18 MED ORDER — MAGNESIUM OXIDE -MG SUPPLEMENT 400 (240 MG) MG PO TABS
400.0000 mg | ORAL_TABLET | Freq: Every day | ORAL | Status: DC
  Administered 2024-11-18 – 2024-11-19 (×2): 400 mg via ORAL
  Filled 2024-11-18 (×2): qty 1

## 2024-11-18 MED ORDER — TRAMADOL HCL 50 MG PO TABS
50.0000 mg | ORAL_TABLET | Freq: Four times a day (QID) | ORAL | Status: DC | PRN
  Administered 2024-11-18: 50 mg via ORAL
  Filled 2024-11-18: qty 1

## 2024-11-18 MED ORDER — FERROUS SULFATE 325 (65 FE) MG PO TABS
325.0000 mg | ORAL_TABLET | Freq: Every day | ORAL | Status: DC
  Administered 2024-11-19: 325 mg via ORAL
  Filled 2024-11-18: qty 1

## 2024-11-18 MED ORDER — PREDNISONE 10 MG PO TABS
5.0000 mg | ORAL_TABLET | Freq: Every day | ORAL | Status: DC
  Administered 2024-11-19: 5 mg via ORAL
  Filled 2024-11-18: qty 1

## 2024-11-18 MED ORDER — SODIUM CHLORIDE 0.9% FLUSH
3.0000 mL | Freq: Two times a day (BID) | INTRAVENOUS | Status: DC
  Administered 2024-11-18 – 2024-11-19 (×3): 3 mL via INTRAVENOUS

## 2024-11-18 NOTE — ED Triage Notes (Addendum)
 Pt to ED via [facility] from [facility]. Pt sent out for CO2 retention. Pt has had broken BiPAP and has been refusing to take it off. Pt continues to wear it all the time on top of her 6L [place] that she wears chronically. Lung sounds diminished per EMS. Pt reports SOB on arrival and broken BiPAP x5 days.   CBG 104 110/74 93% on 6L [place] ET 24 20g LFA

## 2024-11-18 NOTE — ED Notes (Signed)
 [facility] INFORMED OF BED ASSIGNED

## 2024-11-18 NOTE — ED Provider Notes (Signed)
 [facility] Provider Note    Event Date/Time   First MD Initiated Contact with Patient 11/18/24 1241     (approximate)   History   Shortness of Breath   HPI  [name] is a 68 y.o. female with COPD on 6 L, CHF, hypertension, PE on Xarelto  who comes in with concerns for CO2 retention.  I reviewed a note where patient was admitted from 9/27 until 9/30 patient was noted to have more shortness of breath and leg swelling patient was given IV Lasix  and admitted for CHF.  Patient does wear BiPAP at nighttime  Patient has had a broken BiPAP and has been refusing to take it off she has been wearing her 6 L.  Patient reports that because she is been more tired in the day she has not been using her Lasix .  She reports 2 days of missed Lasix  doses with has led to some increasing shortness of breath.    Physical Exam   Triage Vital Signs: ED Triage Vitals  Encounter Vitals Group     BP 11/18/24 1151 108/63     Girls Systolic BP Percentile --      Girls Diastolic BP Percentile --      Boys Systolic BP Percentile --      Boys Diastolic BP Percentile --      Pulse Rate 11/18/24 1151 92     Resp 11/18/24 1151 (!) 26     Temp 11/18/24 1155 98.1 F (36.7 C)     Temp Source 11/18/24 1155 Oral     SpO2 11/18/24 1151 91 %     Weight --      Height --      Head Circumference --      Peak [place] --      Pain Score 11/18/24 1151 0     Pain Loc --      Pain Education --      Exclude from Growth Chart --     Most recent vital signs: Vitals:   11/18/24 1151 11/18/24 1155  BP: 108/63   Pulse: 92   Resp: (!) 26   Temp:  98.1 F (36.7 C)  SpO2: 91%      General: Awake, no distress.  CV:  Good peripheral perfusion.  Resp:  Normal effort.  On 6 L of oxygen baseline Abd:  No distention.  Soft and nontender Other:  Mild swelling noted bilaterally   ED Results / Procedures / Treatments   Labs (all labs ordered are listed, but only abnormal results are  displayed) Labs Reviewed  CBC - Abnormal; Notable for the following components:      Result Value   RBC 3.17 (*)    Hemoglobin 9.3 (*)    HCT 31.2 (*)    MCHC 29.8 (*)    All other components within normal limits  BLOOD GAS, VENOUS - Abnormal; Notable for the following components:   pH, Ven 7.45 (*)    pCO2, Ven 61 (*)    Bicarbonate 42.4 (*)    Acid-Base Excess 15.3 (*)    All other components within normal limits  BASIC METABOLIC PANEL WITH GFR  TROPONIN T, HIGH SENSITIVITY     EKG  My interpretation of EKG:  Normal sinus rhythm 88 without any ST elevation or T wave inversions, normal intervals  RADIOLOGY I have reviewed the xray personally and interpreted positive congestion   PROCEDURES:  Critical Care performed: No  .1-3 Lead EKG  Interpretation  Performed by: Ernest Ronal BRAVO, MD Authorized by: Ernest Ronal BRAVO, MD     Interpretation: normal     ECG rate:  90   ECG rate assessment: normal     Rhythm: sinus rhythm     Ectopy: none     Conduction: normal      MEDICATIONS ORDERED IN ED: Medications  furosemide  (LASIX ) injection 40 mg (40 mg Intravenous Given 11/18/24 1407)     IMPRESSION / MDM / ASSESSMENT AND PLAN / ED COURSE  I reviewed the triage vital signs and the nursing notes.   Patient's presentation is most consistent with acute presentation with potential threat to life or bodily function.   Patient comes in with concern for worsening shortness of breath concern for hypercapnia.  Blood work was ordered chest x-ray ordered.  Patient has evidence of pulmonary congestion in the setting of missed doses of Lasix .  Patient be ordered dose of Lasix  as suspect this is more likely related to CHF.  BNP is reassuring troponin slightly elevated but flat.  BMP reassuring.  Discussed with a child psychotherapist and there is no way for us  to get a new bpap mask for her to go home with.  Given her worsening shortness of breath I suspect that if we were to send her home that  it her CO2 levels would go up and to continue to miss doses of her Lasix  therefore I do think she could benefit from admission for IV diuresis, BiPAP at nighttime until this situation is sorted out.    The patient is on the cardiac monitor to evaluate for evidence of arrhythmia and/or significant heart rate changes.      FINAL CLINICAL IMPRESSION(S) / ED DIAGNOSES   Final diagnoses:  Acute respiratory failure with hypoxia and hypercapnia (HCC)     Rx / DC Orders   ED Discharge Orders     None        Note:  This document was prepared using Dragon voice recognition software and may include unintentional dictation errors.   Ernest Ronal BRAVO, MD 11/18/24 [phone]

## 2024-11-18 NOTE — TOC Initial Note (Signed)
 Transition of Care [facility]) - Initial/Assessment Note    Patient Details  Name: [name] MRN: [id number] Date of Birth: October 12, 1956  Transition of Care [facility]) CM/SW Contact:    Nathanael CHRISTELLA Ring, RN Phone Number: 11/18/2024, 2:03 PM  Clinical Narrative:                 CM met with patient at the bedside, she is alert and oriented.  She reports that her Bipap is not broken but the mask is.  I also called [facility] and spoke with the nurse Brandi and she confirms that the patient's bipap is not broken the mask has a split in it.  She says that the facility doctor wanted to send her out to the ED to be checked because she has a history of CO2 retention and she has had AMS for the past 2 days.   Patient says that she has been too tired to take her medications for the past 2 days so she has not had her regular fluid pill.  She also says that she has been wearing her bipap constantly for the past 2 days and she just holds the mask down so it will have a seal.  ED provider is going to admit. [facility] has ordered a new mask from 2 different places to see which one would arrive first - ordered on Monday.   TOC to follow up with [facility], patient will need the new mask before leaving, respiratory therapy here at the hospital can not give her a mask because they cannot verify if it will be compatible.    Expected Discharge Plan: Skilled Nursing Facility Barriers to Discharge: Continued Medical Work up   Patient Goals and CMS Choice            Expected Discharge Plan and Services   Discharge Planning Services: CM Consult   Living arrangements for the past 2 months: Skilled Nursing Facility                 DME Arranged: N/A                    Prior Living Arrangements/Services Living arrangements for the past 2 months: Skilled Nursing Facility Lives with:: Facility Resident Patient language and need for interpreter reviewed:: Yes Do you feel safe going back to the place where you live?: Yes       Need for Family Participation in Patient Care: Yes (Comment) Care giver support system in place?: Yes (comment)   Criminal Activity/Legal Involvement Pertinent to Current Situation/Hospitalization: No - Comment as needed  Activities of Daily Living      Permission Sought/Granted Permission sought to share information with : Facility Engineer, Maintenance (it) granted to share info w AGENCY: [facility]        Emotional Assessment Appearance:: Appears older than stated age Attitude/Demeanor/Rapport: Engaged Affect (typically observed): Accepting Orientation: : Oriented to Self, Oriented to Place, Oriented to  Time, Oriented to Situation Alcohol / Substance Use: Not Applicable Psych Involvement: No (comment)  Admission diagnosis:  Respiratory Patient Active Problem List   Diagnosis Date Noted   Obesity (BMI 30-39.9) 09/14/2024   Leg pain, diffuse, right 09/14/2024   Hyponatremia 09/12/2024   Acute metabolic encephalopathy 09/12/2024   Traumatic hematoma of left lower leg 06/30/2024   Syncope 06/24/2024   Anemia of chronic disease 06/23/2024   Acute hypoxic on chronic hypercapnic respiratory failure (HCC) 06/14/2024   ABLA (acute blood loss  anemia) 06/14/2024   Open wound of left lower leg 06/14/2024   Fall 06/14/2024   Hypercapnic respiratory failure (HCC) 05/31/2024   Acute hypoxic respiratory failure (HCC) 05/30/2024   History of pulmonary embolism 05/21/2024   Obesity, Class III, BMI 40-49.9 (morbid obesity) (HCC) 05/21/2024   Atrial fibrillation (HCC) 05/21/2024   Chronic anticoagulation 05/21/2024   Acute on chronic respiratory failure with hypoxia (HCC) 05/21/2024   Epistaxis 05/21/2024   History of DVT (deep vein thrombosis) 05/21/2024   CHF exacerbation (HCC) 05/21/2024   Acute on chronic heart failure with preserved ejection fraction (HFpEF) (HCC) 05/20/2024   Type 2 diabetes mellitus without complication, without long-term current use of insulin   (HCC) 05/28/2023   COVID-19 01/05/2021   Hypoxia    Chronic obstructive pulmonary disease (HCC)    Mitral valve annular calcification 04/07/2018   Depression 04/07/2018   Diastolic heart failure (HCC) 02/10/2018   Family history of hemophilia B 12/02/2017   Healthcare maintenance 05/07/2017   Onychomycosis 05/07/2017   Tricompartment osteoarthritis of both knees 02/19/2017   Acute kidney injury superimposed on CKD 04/23/2015   Rheumatoid arthritis (HCC) 07/08/2008   Morbid obesity (HCC) 05/04/2008   Essential hypertension 05/04/2008   ALLERGIC RHINITIS 05/04/2008   GERD 05/04/2008   VARICOSE VEINS LOWER EXTREMITIES W/INFLAMMATION 11/02/2007   Hypothyroidism 08/18/2007   PCP:  [name] DASEN, PA-C Pharmacy:   [facility] [id number] [place], [place] - [address] AT [address] [place]. [place] [address] Phone: [phone] Fax: [phone]  [facility] - [facility] [address] [place] [address] Phone: [phone] Fax: [phone]  [facility] [place], [place] - [address] [place] [address] Phone: [phone] Fax: [phone]     Social Drivers of Health (SDOH) Social History: SDOH Screenings   Food Insecurity: No Food Insecurity (09/11/2024)  Housing: Low Risk  (09/11/2024)  Transportation Needs: No Transportation Needs (09/11/2024)  Utilities: Not At Risk (09/11/2024)  Financial Resource Strain: High Risk (05/24/2024)  Social Connections: Socially Isolated (09/11/2024)  Tobacco Use: Medium Risk (09/11/2024)   SDOH Interventions:     Readmission Risk Interventions     No data to display

## 2024-11-18 NOTE — H&P (Signed)
 History and Physical    [name] FMW:[id number] DOB: 10-13-1956 DOA: 11/18/2024  DOS: the patient was seen and examined on 11/18/2024  PCP: Marilyn Tari DASEN, PA-C   Patient coming from: [facility]  I have personally briefly reviewed patient's old medical records in [name] and CareEverywhere  HPI:   [name] is a 68 y.o. year old female with medical history of HTN, HLD, CHF, Paroxysmal a fib, and chronic hypoxic respiratory failure on 6 L presenting to the ED after provider at the facility sent her here for evaluation given increasing somnolence.    Pt states she has been more sleepy over the last 2 days and has not been taking her medications for this reason.  She denies any other problems.  She states her BiPAP is not broken but the mask is broken.  She has been using it intermittently by holding it up against her mouth because it makes her feel better.  She denies any coughing, fever or chills, dysuria or any other symptoms of infection.  On arrival to the ED patient was noted to be HDS stable.  Lab work and imaging obtained.  CBC without leukocytosis, baseline hemoglobin around 9.3.  BMP unremarkable.  VBG shows 7.4 5/61/44.  There is some hypercarbia but it is mild.  Troponin slightly elevated but stable.  proBNP normal.  Chest x-ray obtained that showed pulmonary edema.  Given patient does not have BiPAP at her facility and they are working on getting her one, EDP wanted to pursue admission so [facility] contacted for admission.  Review of Systems: As mentioned in the history of present illness. All other systems reviewed and are negative.   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    HTN (hypertension)    Hypercholesteremia    Hypothyroid    Rheumatoid  arthritis(714.0)    Varicose vein of leg     Past Surgical History:  Procedure Laterality Date   APPENDECTOMY     CHOLECYSTECTOMY     ELBOW SURGERY     LEFT   ENDOSCOPIC VEIN LASER TREATMENT     RIGHT LEG   EXPLORATORY LAPAROTOMY     HAND SURGERY     RIGHT    HEEL SPUR SURGERY     BILAT   INSERTION OF MESH N/A 08/04/2017   Procedure: INSERTION OF MESH;  Surgeon: Eletha Boas, MD;  Location: [facility] OR;  Service: General;  Laterality: N/A;   partial thyroidectomy  2015   TONSILLECTOMY     TUBAL LIGATION     VENTRAL HERNIA REPAIR N/A 08/04/2017   Procedure: LAPAROSCOPIC VENTRAL INCISIONAL HERNIA REPAIR WITH MESH;  Surgeon: Eletha Boas, MD;  Location: [facility] OR;  Service: General;  Laterality: N/A;     Allergies  Allergen Reactions   Latex Hives, Itching and Dermatitis    Blisters (also)  Skin blisters   Oxycodone -Acetaminophen  Nausea Only and Other (See Comments)    Violent Vomiting Violent Vomiting   Penicillins Anaphylaxis, Swelling and Rash    Has patient had a PCN reaction causing immediate rash, facial/tongue/throat swelling, SOB or lightheadedness with hypotension: Yes Has patient had a PCN reaction causing severe rash involving mucus membranes or skin necrosis: No Has patient had a PCN  reaction that required hospitalization No Has patient had a PCN reaction occurring within the last 10 years: No If all of the above answers are NO, then may proceed with Cephalosporin use.    Strawberry Extract Hives, Itching, Rash, Anaphylaxis, Dermatitis and Swelling   Oxycodone -Acetaminophen  Nausea And Vomiting   Tyloxapol Nausea And Vomiting   Adhesive [Tape] Rash    Patient prefers paper tape   Wound Dressing Adhesive Dermatitis    Family History  Problem Relation Age of Onset   Diabetes Mother    Kidney disease Mother    Lung cancer Father    Alzheimer's disease Maternal Grandmother    Stroke Maternal Grandfather    Heart disease Paternal Grandmother    Unexplained death  Paternal Grandfather     Prior to Admission medications   Medication Sig Start Date End Date Taking? Authorizing Provider  acetaminophen  (TYLENOL ) 500 MG tablet Take 500 mg by mouth every 8 (eight) hours as needed for mild pain (pain score 1-3).   Yes [provider]  atorvastatin  (LIPITOR) 10 MG tablet Take 10 mg by mouth at bedtime.   Yes [provider]  benzonatate (TESSALON) 100 MG capsule Take 100 mg by mouth every 8 (eight) hours as needed for cough.   Yes [provider]  budesonide  (PULMICORT ) 0.5 MG/2ML nebulizer solution Take 0.5 mg by nebulization 2 (two) times daily.   Yes [provider]  cetirizine (ZYRTEC) 10 MG tablet Take 10 mg by mouth daily.   Yes [provider]  diltiazem  (CARDIZEM ) 120 MG tablet Take 120 mg by mouth daily. 08/31/24  Yes [provider]  empagliflozin  (JARDIANCE ) 10 MG TABS tablet Take 1 tablet (10 mg total) by mouth daily before breakfast. 07/27/24  Yes Hackney, Tina A, FNP  ferrous sulfate  325 (65 FE) MG EC tablet Take 325 mg by mouth daily with breakfast.   Yes [provider]  furosemide  (LASIX ) 40 MG tablet Take 1 tablet (40 mg total) by mouth 2 (two) times daily. 09/14/24  Yes Wieting, Richard, MD  gabapentin  (NEURONTIN ) 400 MG capsule Take 400 mg by mouth 3 (three) times daily.   Yes [provider]  hydroxychloroquine  (PLAQUENIL ) 200 MG tablet Take 200 mg by mouth daily.   Yes [provider]  ipratropium-albuterol  (DUONEB) 0.5-2.5 (3) MG/3ML SOLN Take 3 mLs by nebulization every 6 (six) hours.   Yes [provider]  levothyroxine  (SYNTHROID ) 175 MCG tablet Take 175 mcg by mouth daily. 10/18/24  Yes [provider]  lidocaine  (LIDODERM ) 5 % Place 2 patches onto the skin daily. 1 patch on left shoulder and 1 patch on right knee 09/03/24  Yes [provider]  magnesium oxide (MAG-OX) 400 (240 Mg) MG tablet Take 400 mg by mouth daily.   Yes [provider]  melatonin 5 MG TABS Take 5 mg by mouth at bedtime.   Yes [provider]  montelukast  (SINGULAIR ) 10 MG tablet Take 10 mg by mouth daily.   Yes [provider]  Multiple Vitamin (MULTIVITAMIN) tablet Take 1 tablet by mouth daily.   Yes [provider]  naloxone (NARCAN) nasal spray 4 mg/0.1 mL Place 1 spray into the nose once.   Yes [provider]  pantoprazole  (PROTONIX ) 40 MG tablet Take 40 mg by mouth daily.   Yes [provider]  potassium chloride  SA (KLOR-CON  M) 20 MEQ tablet Take 20 mEq by mouth 2 (two) times daily. 08/27/24  Yes [provider]  predniSONE  (DELTASONE )  5 MG tablet Take 5 mg by mouth daily with breakfast.   Yes [provider]  rivaroxaban  (XARELTO ) 20 MG TABS tablet Take 20 mg by mouth daily.   Yes [provider]  sennosides-docusate sodium  (SENOKOT-S) 8.6-50 MG tablet Take 2 tablets by mouth 2 (two) times daily.   Yes [provider]  sertraline  (ZOLOFT ) 25 MG tablet Take 3 tablets (75 mg total) by mouth daily. 07/01/24  Yes Caleen Qualia, MD  traMADol  (ULTRAM ) 50 MG tablet Take 1 tablet (50 mg total) by mouth every 6 (six) hours as needed for severe pain (pain score 7-10). 09/14/24  Yes Wieting, Richard, MD  TRELEGY ELLIPTA 100-62.5-25 MCG/ACT AEPB Inhale 1 puff into the lungs daily. 08/23/24  Yes [provider]  Zinc Oxide 25 % PSTE Apply 1 Application topically daily. Apply to posterior every shift   Yes [provider]  Cyanocobalamin  1000 MCG TBCR Take 1 tablet by mouth daily. Patient not taking: Reported on 11/18/2024    [provider]  doxycycline  (VIBRA -TABS) 100 MG tablet Take 1 tablet (100 mg total) by mouth every 12 (twelve) hours. Patient not taking: Reported on 11/18/2024 09/14/24   Josette Ade, MD  folic acid  (FOLVITE ) 1 MG tablet Take 1 mg by mouth daily.    [provider]  furosemide  (LASIX ) 20 MG tablet Take 40 mg by mouth  2 (two) times daily. Patient not taking: Reported on 11/18/2024 11/17/24   [provider]  levothyroxine  (SYNTHROID ) 150 MCG tablet Take 150 mcg by mouth daily before breakfast. Patient not taking: Reported on 11/18/2024    [provider]  oxymetazoline  (AFRIN) 0.05 % nasal spray Place 1 spray into both nostrils 2 (two) times daily. Patient not taking: Reported on 11/18/2024 05/24/24   Kandis Devaughn Sayres, MD    Social History:  reports that she quit smoking about 4 years ago. Her smoking use included cigarettes. She started smoking about 44 years ago. She has a 40 pack-year smoking history. She has never used smokeless tobacco. She reports that she does not drink alcohol and does not use drugs. lives at nursing facility Tobacco-previous tobacco use EtOH-  Denies use.  Illicit drug use- denies use.  IADLs/ADLs-dependent ADLs and IADLs, and is a wheelchair for mobility   Physical Exam: Vitals:   11/18/24 1500 11/18/24 1530 11/18/24 1554 11/18/24 1600  BP: 115/68 114/66  106/60  Pulse: 91 95  95  Resp: 18 (!) 24  17  Temp:   98.3 F (36.8 C)   TempSrc:   Oral   SpO2: 99% 96%  96%     Gen: Pleasant female appears older than stated age HENT: Nasal cannula in place, NCAT CV: Regular rate and rhythm Resp: CTAB Abd: No TTP, no bowel sounds MSK: No symmetry Skin: Bilateral lower extremities with stasis dermatitis Neuro: Alert and oriented x 4 Psych: Pleasant mood   Labs on Admission: I have personally reviewed following labs and imaging studies  CBC: Recent Labs  Lab 11/18/24 1157  WBC 6.1  HGB 9.3*  HCT 31.2*  MCV 98.4  PLT 174   Basic Metabolic Panel: Recent Labs  Lab 11/18/24 1157 11/18/24 1330  NA 140  --   K 4.3  --   CL 102  --   CO2 31  --   GLUCOSE 79  --   BUN 13  --   CREATININE 0.88  --   CALCIUM  9.2  --   MG  --  2.1  GFR: CrCl cannot be calculated (Unknown ideal weight.). Liver Function Tests: No results for input(s): AST,  ALT, ALKPHOS, BILITOT, PROT, ALBUMIN in the last 168 hours. No results for input(s): LIPASE, AMYLASE in the last 168 hours. No results for input(s): AMMONIA in the last 168 hours. Coagulation Profile: No results for input(s): INR, PROTIME in the last 168 hours. Cardiac Enzymes: No results for input(s): CKTOTAL, CKMB, CKMBINDEX, TROPONINI, TROPONINIHS in the last 168 hours. BNP (last 3 results) Recent Labs    05/30/24 1658 06/14/24 0947 09/11/24 0102  BNP 750.8* 163.5* 142.3*   HbA1C: No results for input(s): HGBA1C in the last 72 hours. CBG: No results for input(s): GLUCAP in the last 168 hours. Lipid Profile: No results for input(s): CHOL, HDL, LDLCALC, TRIG, CHOLHDL, LDLDIRECT in the last 72 hours. Thyroid  Function Tests: No results for input(s): TSH, T4TOTAL, FREET4, T3FREE, THYROIDAB in the last 72 hours. Anemia Panel: No results for input(s): VITAMINB12, FOLATE, FERRITIN, TIBC, IRON, RETICCTPCT in the last 72 hours. Urine analysis:    Component Value Date/Time   COLORURINE YELLOW (A) 09/11/2024 0156   APPEARANCEUR CLEAR (A) 09/11/2024 0156   LABSPEC 1.008 09/11/2024 0156   PHURINE 5.0 09/11/2024 0156   GLUCOSEU 50 (A) 09/11/2024 0156   HGBUR NEGATIVE 09/11/2024 0156   HGBUR small 07/21/2008 1538   BILIRUBINUR NEGATIVE 09/11/2024 0156   KETONESUR NEGATIVE 09/11/2024 0156   PROTEINUR NEGATIVE 09/11/2024 0156   UROBILINOGEN 0.2 04/23/2015 1851   NITRITE POSITIVE (A) 09/11/2024 0156   LEUKOCYTESUR NEGATIVE 09/11/2024 0156    Radiological Exams on Admission: I have personally reviewed images DG Chest Portable 1 View Result Date: 11/18/2024 CLINICAL DATA:  Shortness of breath. EXAM: PORTABLE CHEST 1 VIEW COMPARISON:  09/11/2024. FINDINGS: Cardiomegaly, unchanged. Aortic atherosclerosis. Pulmonary vascular congestion. Stable calcified granuloma in the lateral right mid lung. Chronic elevation of the right  hemidiaphragm with mild right basilar scarring. Small right pleural effusion cannot be excluded. No pneumothorax. Surgical clips overlying the neck. No acute osseous abnormality. IMPRESSION: Cardiomegaly with pulmonary vascular congestion. Small right pleural effusion cannot be excluded. Electronically Signed   By: Harrietta Sherry M.D.   On: 11/18/2024 13:21    EKG: My personal interpretation of EKG shows: Normal sinus rhythm without any acute ST changes, unchanged when compared to prior EKG.  Assessment/Plan Principal Problem:   Hypercapnic respiratory failure (HCC) Active Problems:   Chronic obstructive pulmonary disease (HCC)   Hypothyroidism   Essential hypertension   GERD   Rheumatoid arthritis (HCC)   Obesity, Class III, BMI 40-49.9 (morbid obesity) (HCC)   Patient with worsening somnolence referred by SNF for this purpose given her BiPAP has been defective.  ABG showed mild hypercapnia which could be contributing to her somnolence.  Will place her on BiPAP and repeat VBG later.  She was satting 97% on 6 L and advised nursing staff to wean her down as goal O2 sat with somebody with COPD is 88-92.  Over oxygenation can also lead to hypercapnia.  On exam patient does not look somnolent..  TOC is working on getting patient BiPAP mask as her facility does not have this.  CHF: Patient without any worsening hypoxia but chest x-ray shows some pulmonary edema which may be in setting of her not taking any of her medicines for the last 2 days.  She received 1 dose of IV Lasix  and will give her 1 dose later but her oral dose can be resumed starting tomorrow.  COPD: Continue home inhalers.  Not in  exacerbation. Hypertension: Continue home med Hypothyroidism: Continue home Synthroid  Rheumatoid arthritis: Continue home med Class III obesity: Discussed lifestyle modifications as obesity is significantly related to hypercapnia Hyperlipidemia: Continue home med  VTE prophylaxis:  Xarelto   Diet:  N.p.o. Code Status:  Full Code Telemetry:  Admission status: Observation, Telemetry bed Patient is from: Home Anticipated d/c is to: Home Anticipated d/c is in: 1-2 days   Family Communication: Updated at bedside  Consults called: None   Severity of Illness: The appropriate patient status for this patient is OBSERVATION. Observation status is judged to be reasonable and necessary in order to provide the required intensity of service to ensure the patient's safety. The patient's presenting symptoms, physical exam findings, and initial radiographic and laboratory data in the context of their medical condition is felt to place them at decreased risk for further clinical deterioration. Furthermore, it is anticipated that the patient will be medically stable for discharge from the hospital within 2 midnights of admission.    Morene Bathe, MD [place] [facility]

## 2024-11-19 ENCOUNTER — Encounter: Payer: Self-pay | Admitting: Internal Medicine

## 2024-11-19 DIAGNOSIS — J9602 Acute respiratory failure with hypercapnia: Secondary | ICD-10-CM | POA: Diagnosis not present

## 2024-11-19 DIAGNOSIS — J9622 Acute and chronic respiratory failure with hypercapnia: Secondary | ICD-10-CM | POA: Diagnosis not present

## 2024-11-19 LAB — CBC
HCT: 31.8 % — ABNORMAL LOW (ref 36.0–46.0)
Hemoglobin: 9.3 g/dL — ABNORMAL LOW (ref 12.0–15.0)
MCH: 28.7 pg (ref 26.0–34.0)
MCHC: 29.2 g/dL — ABNORMAL LOW (ref 30.0–36.0)
MCV: 98.1 fL (ref 80.0–100.0)
Platelets: 143 K/uL — ABNORMAL LOW (ref 150–400)
RBC: 3.24 MIL/uL — ABNORMAL LOW (ref 3.87–5.11)
RDW: 15.1 % (ref 11.5–15.5)
WBC: 6.4 K/uL (ref 4.0–10.5)
nRBC: 0 % (ref 0.0–0.2)

## 2024-11-19 LAB — BASIC METABOLIC PANEL WITH GFR
Anion gap: 9 (ref 5–15)
BUN: 13 mg/dL (ref 8–23)
CO2: 34 mmol/L — ABNORMAL HIGH (ref 22–32)
Calcium: 9.1 mg/dL (ref 8.9–10.3)
Chloride: 100 mmol/L (ref 98–111)
Creatinine, Ser: 1.03 mg/dL — ABNORMAL HIGH (ref 0.44–1.00)
GFR, Estimated: 59 mL/min — ABNORMAL LOW (ref >60)
Glucose, Bld: 68 mg/dL — ABNORMAL LOW (ref 70–99)
Potassium: 4 mmol/L (ref 3.5–5.1)
Sodium: 143 mmol/L (ref 135–145)

## 2024-11-19 LAB — GLUCOSE, CAPILLARY: Glucose-Capillary: 70 mg/dL (ref 70–99)

## 2024-11-19 MED ORDER — GERHARDT'S BUTT CREAM
TOPICAL_CREAM | Freq: Three times a day (TID) | CUTANEOUS | Status: DC | PRN
  Administered 2024-11-19 (×2): 1 via TOPICAL
  Filled 2024-11-19: qty 60

## 2024-11-19 MED ORDER — TRAMADOL HCL 50 MG PO TABS: 50.0000 mg | ORAL_TABLET | Freq: Four times a day (QID) | ORAL | 0 refills | Status: AC | PRN

## 2024-11-19 MED ORDER — IPRATROPIUM-ALBUTEROL 0.5-2.5 (3) MG/3ML IN SOLN: 3.0000 mL | Freq: Two times a day (BID) | RESPIRATORY_TRACT | Status: DC

## 2024-11-19 NOTE — ED Notes (Addendum)
 [facility]RA

## 2024-11-19 NOTE — TOC Transition Note (Signed)
 Transition of Care [facility]) - Discharge Note   Patient Details  Name: [name] MRN: [id number] Date of Birth: 25-Feb-1956  Transition of Care [facility]) CM/SW Contact:  Shasta DELENA Daring, RN Phone Number: 11/19/2024, 4:18 PM   Clinical Narrative:    Confirmed with [facility] that new Bi-Pap mask has been delivered. No additional TOC needs. Patient has discharge orders. Transport arranged by [facility]. Report number given to bedside nurse.   RNCM signing off   Final next level of care: Skilled Nursing Facility Barriers to Discharge: Barriers Resolved   Patient Goals and CMS Choice            Discharge Placement              Patient chooses bed at: [facility] [place] Patient to be transferred to facility by: [facility] Name of family member notified: Patient declined Patient and family notified of of transfer: 11/19/24  Discharge Plan and Services Additional resources added to the After Visit Summary for     Discharge Planning Services: CM Consult            DME Arranged: N/A                    Social Drivers of Health (SDOH) Interventions SDOH Screenings   Food Insecurity: No Food Insecurity (11/19/2024)  Housing: Unknown (11/19/2024)  Transportation Needs: No Transportation Needs (11/19/2024)  Utilities: Not At Risk (11/19/2024)  Financial Resource Strain: High Risk (05/24/2024)  Social Connections: Unknown (11/19/2024)  Recent Concern: Social Connections - Socially Isolated (09/11/2024)  Tobacco Use: Medium Risk (11/19/2024)     Readmission Risk Interventions     No data to display

## 2024-11-19 NOTE — [place] FL2 (Signed)
 [place]  MEDICAID FL2 LEVEL OF CARE FORM     IDENTIFICATION  Patient Name: [name] Birthdate: 26-Jul-1956 Sex: female Admission Date (Current Location): 11/18/2024  [facility] and [place] Number:  Chiropodist and Address:  [facility], [address], [place], [place] [address]      Provider Number: [id number]  Attending Physician Name and Address:  Mosie Ford, MD  Relative Name and Phone Number:       Current Level of Care: Hospital Recommended Level of Care: Skilled Nursing Facility Prior Approval Number:    Date Approved/Denied:   PASRR Number: [id number]  Discharge Plan: SNF    Current Diagnoses: Patient Active Problem List   Diagnosis Date Noted   Obesity (BMI 30-39.9) 09/14/2024   Leg pain, diffuse, right 09/14/2024   Hyponatremia 09/12/2024   Acute metabolic encephalopathy 09/12/2024   Traumatic hematoma of left lower leg 06/30/2024   Syncope 06/24/2024   Anemia of chronic disease 06/23/2024   Acute hypoxic on chronic hypercapnic respiratory failure (HCC) 06/14/2024   ABLA (acute blood loss anemia) 06/14/2024   Open wound of left lower leg 06/14/2024   Fall 06/14/2024   Hypercapnic respiratory failure (HCC) 05/31/2024   Acute hypoxic respiratory failure (HCC) 05/30/2024   History of pulmonary embolism 05/21/2024   Obesity, Class III, BMI 40-49.9 (morbid obesity) (HCC) 05/21/2024   Paroxysmal atrial fibrillation (HCC) 05/21/2024   Chronic anticoagulation 05/21/2024   Acute on chronic respiratory failure with hypoxia (HCC) 05/21/2024   Epistaxis 05/21/2024   History of DVT (deep vein thrombosis) 05/21/2024   CHF exacerbation (HCC) 05/21/2024   CHF (congestive heart failure) (HCC) 05/20/2024   Type 2 diabetes mellitus without complication, without long-term current use of insulin  (HCC) 05/28/2023   COVID-19 01/05/2021   Hypoxia    Chronic obstructive pulmonary disease (HCC)    Mitral valve annular  calcification 04/07/2018   Depression 04/07/2018   Diastolic heart failure (HCC) 02/10/2018   Family history of hemophilia B 12/02/2017   Healthcare maintenance 05/07/2017   Onychomycosis 05/07/2017   Tricompartment osteoarthritis of both knees 02/19/2017   Acute kidney injury superimposed on CKD 04/23/2015   Rheumatoid arthritis (HCC) 07/08/2008   Morbid obesity (HCC) 05/04/2008   Essential hypertension 05/04/2008   ALLERGIC RHINITIS 05/04/2008   GERD 05/04/2008   VARICOSE VEINS LOWER EXTREMITIES W/INFLAMMATION 11/02/2007   Hypothyroidism 08/18/2007    Orientation RESPIRATION BLADDER Height & Weight     Self, Time, Situation, Place  Other (Comment), O2 (Nasal cannula 5 L. Bipap QHS at baseline.) Continent Weight: 216 lb 4.3 oz (98.1 kg) (bed weight) Height:     BEHAVIORAL SYMPTOMS/MOOD NEUROLOGICAL BOWEL NUTRITION STATUS   (None)  (None) Incontinent Diet (Heart healthy. Fluid restriction: 2000 mL.)  AMBULATORY STATUS COMMUNICATION OF NEEDS Skin     Verbally Other (Comment), PU Stage and Appropriate Care (Erythema/redness.) PU Stage 1 Dressing:  (Buttocks: Foam.) PU Stage 2 Dressing:  (Both thighs: Foam.)                   Personal Care Assistance Level of Assistance              Functional Limitations Info  Sight, Hearing, Speech Sight Info: Adequate Hearing Info: Adequate Speech Info: Adequate    SPECIAL CARE FACTORS FREQUENCY                       Contractures Contractures Info: Not present    Additional Factors Info  Code Status,  Allergies, Isolation Precautions Code Status Info: Full code Allergies Info: Latex, Oxycodone -acetaminophen , Penicillins, Strawberry Extract, Oxycodone -acetaminophen , Tyloxapol, Adhesive (Tape), Wound Dressing Adhesive     Isolation Precautions Info: Contact precautions: ESBL     Current Medications (11/19/2024):  This is the current hospital active medication list Current Facility-Administered Medications  Medication  Dose Route Frequency Provider Last Rate Last Admin   acetaminophen  (TYLENOL ) tablet 650 mg  650 mg Oral Q6H PRN Fernand Prost, MD       Or   acetaminophen  (TYLENOL ) suppository 650 mg  650 mg Rectal Q6H PRN Fernand Prost, MD       atorvastatin  (LIPITOR) tablet 10 mg  10 mg Oral QHS Khan, Ghalib, MD   10 mg at 11/18/24 2107   budesonide  (PULMICORT ) nebulizer solution 0.5 mg  0.5 mg Nebulization BID PRN Khan, Ghalib, MD       budesonide -glycopyrrolate -formoterol  (BREZTRI ) 160-9-4.8 MCG/ACT inhaler 2 puff  2 puff Inhalation BID Fernand Prost, MD   2 puff at 11/19/24 1001   diltiazem  (CARDIZEM ) tablet 120 mg  120 mg Oral Daily Khan, Ghalib, MD   120 mg at 11/19/24 0957   empagliflozin  (JARDIANCE ) tablet 10 mg  10 mg Oral QAC breakfast Fernand Prost, MD   10 mg at 11/19/24 9044   ferrous sulfate  tablet 325 mg  325 mg Oral Q breakfast Fernand Prost, MD   325 mg at 11/19/24 9043   folic acid  (FOLVITE ) tablet 1 mg  1 mg Oral Daily Khan, Ghalib, MD   1 mg at 11/19/24 9043   furosemide  (LASIX ) tablet 40 mg  40 mg Oral BID Khan, Ghalib, MD   40 mg at 11/19/24 0957   gabapentin  (NEURONTIN ) capsule 400 mg  400 mg Oral TID Khan, Ghalib, MD   400 mg at 11/19/24 9044   hydroxychloroquine  (PLAQUENIL ) tablet 200 mg  200 mg Oral Daily Khan, Ghalib, MD   200 mg at 11/19/24 9044   ipratropium-albuterol  (DUONEB) 0.5-2.5 (3) MG/3ML nebulizer solution 3 mL  3 mL Nebulization BID Al-Sultani, Anmar, MD       levothyroxine  (SYNTHROID ) tablet 175 mcg  175 mcg Oral Daily Khan, Ghalib, MD   175 mcg at 11/19/24 0638   loratadine  (CLARITIN ) tablet 10 mg  10 mg Oral Daily Khan, Ghalib, MD   10 mg at 11/19/24 0957   magnesium  oxide (MAG-OX) tablet 400 mg  400 mg Oral Daily Khan, Ghalib, MD   400 mg at 11/19/24 9043   melatonin tablet 5 mg  5 mg Oral QHS Khan, Ghalib, MD   5 mg at 11/18/24 2107   montelukast  (SINGULAIR ) tablet 10 mg  10 mg Oral Daily Khan, Ghalib, MD   10 mg at 11/19/24 0957   multivitamin with minerals tablet 1 tablet   1 tablet Oral Daily Khan, Ghalib, MD   1 tablet at 11/19/24 9043   pantoprazole  (PROTONIX ) EC tablet 40 mg  40 mg Oral Daily Khan, Ghalib, MD   40 mg at 11/19/24 9043   potassium chloride  SA (KLOR-CON  M) CR tablet 20 mEq  20 mEq Oral BID Khan, Ghalib, MD   20 mEq at 11/19/24 0957   predniSONE  (DELTASONE ) tablet 5 mg  5 mg Oral Q breakfast Khan, Ghalib, MD   5 mg at 11/19/24 0957   rivaroxaban  (XARELTO ) tablet 20 mg  20 mg Oral Daily Khan, Ghalib, MD   20 mg at 11/19/24 0957   senna-docusate (Senokot-S) tablet 1 tablet  1 tablet Oral QHS PRN Fernand Prost, MD  senna-docusate (Senokot-S) tablet 2 tablet  2 tablet Oral BID Fernand Prost, MD   2 tablet at 11/19/24 9043   sertraline  (ZOLOFT ) tablet 75 mg  75 mg Oral Daily Khan, Ghalib, MD   75 mg at 11/19/24 9043   sodium chloride  flush (NS) 0.9 % injection 3 mL  3 mL Intravenous Q12H Khan, Ghalib, MD   3 mL at 11/19/24 1000   traMADol  (ULTRAM ) tablet 50 mg  50 mg Oral Q6H PRN Khan, Ghalib, MD   50 mg at 11/18/24 1956     Discharge Medications: Please see discharge summary for a list of discharge medications.  Relevant Imaging Results:  Relevant Lab Results:   Additional Information SS#: [id number]  Lauraine JAYSON Carpen, LCSW

## 2024-11-19 NOTE — Discharge Summary (Signed)
 Physician Discharge Summary   Patient: ARIJANA NARAYAN MRN: [id number] DOB: October 09, 1956  Admit date:     11/18/2024  Discharge date: 11/19/24  Discharge Physician: Duffy Al-Sultani   PCP: [name] DASEN, PA-C   Recommendations at discharge:    Follow up with PCP in 1 week for routine post-hospital follow up  Discharge Diagnoses: Principal Problem:   Hypercapnic respiratory failure (HCC) Active Problems:   Chronic obstructive pulmonary disease (HCC)   Hypothyroidism   Essential hypertension   GERD   Rheumatoid arthritis (HCC)   Obesity, Class III, BMI 40-49.9 (morbid obesity) [facility])  Hospital Course: The patient is 68 year old female with PMHx of chronic hypoxic hypercapnic respite failure on 6 L and BiPAP at home, HTN, HLD, CHF, paroxysmal A-fib who presented to the ED on 11/18/2024 from [facility] for increasing somnolence with concern for hypercarbia in the setting of malfunction of her BiPAP mask for 5 days.  Reportedly, the patient's BiPAP mask had a crack in it and the facility had ordered a replacement which had not arrived yet.  Facility MD was concerned about CO2 retention given the patient was becoming altered and somnolent for 2 days.  Additionally, she reported being too tired to take her medications and missed 2 days of Lasix .  On arrival to the ED, the patient was noted to be hemodynamically stable with a temp of 98.1 F, HR 92, RR 26, BP 108/63, SpO2 91% to 99% on 6 L nasal cannula. VBG showed pH 7.45, pCO2 61, bicarb 42.4.  proBNP 284.  CBC was stable from prior.  BMP was unremarkable.  High-sensitivity troponins were flat at 44.  Magnesium  2.1.  CXR showed cardiomegaly with pulmonary vascular congestion, unable to exclude small right pleural effusion.  Patient was admitted for diuresis and BiPAP use until the facility can obtain a BiPAP mask.  She was diuresed with IV Lasix  on admission and then switched back to her home dose of Lasix .  Patient did well overnight,  tolerated BiPAP throughout, and maintained an appropriate SpO2 > 92%.  Subjectively, she reported feeling much better and was alert and oriented x 4.  TOC was informed that the patient's facility was able to obtain a BiPAP mask today.  Given that, she will be discharged back to the facility in hemodynamically stable condition to continue her home meds as previously prescribed.   Consultants: None Procedures performed: None  Disposition: Skilled nursing facility Diet recommendation:  Diet Orders (From admission, onward)     Start     Ordered   11/19/24 0747  Diet Heart Room service appropriate? Yes; Fluid consistency: Thin; Fluid restriction: 2000 mL Fluid  Diet effective now       Question Answer Comment  Room service appropriate? Yes   Fluid consistency: Thin   Fluid restriction: 2000 mL Fluid      11/19/24 0746            DISCHARGE MEDICATION: Allergies as of 11/19/2024       Reactions   Latex Hives, Itching, Dermatitis   Blisters (also) Skin blisters   Oxycodone -acetaminophen  Nausea Only, Other (See Comments)   Violent Vomiting Violent Vomiting   Penicillins Anaphylaxis, Swelling, Rash   Has patient had a PCN reaction causing immediate rash, facial/tongue/throat swelling, SOB or lightheadedness with hypotension: Yes Has patient had a PCN reaction causing severe rash involving mucus membranes or skin necrosis: No Has patient had a PCN reaction that required hospitalization No Has patient had a PCN reaction occurring within the  last 10 years: No If all of the above answers are NO, then may proceed with Cephalosporin use.   Strawberry Extract Hives, Itching, Rash, Anaphylaxis, Dermatitis, Swelling   Oxycodone -acetaminophen  Nausea And Vomiting   Tyloxapol Nausea And Vomiting   Adhesive [tape] Rash   Patient prefers paper tape   Wound Dressing Adhesive Dermatitis        Medication List     STOP taking these medications    Cyanocobalamin  1000 MCG Tbcr    doxycycline  100 MG tablet Commonly known as: VIBRA -TABS   oxymetazoline  0.05 % nasal spray Commonly known as: AFRIN       TAKE these medications    acetaminophen  500 MG tablet Commonly known as: TYLENOL  Take 500 mg by mouth every 8 (eight) hours as needed for mild pain (pain score 1-3).   atorvastatin  10 MG tablet Commonly known as: LIPITOR Take 10 mg by mouth at bedtime.   benzonatate 100 MG capsule Commonly known as: TESSALON Take 100 mg by mouth every 8 (eight) hours as needed for cough.   budesonide  0.5 MG/2ML nebulizer solution Commonly known as: PULMICORT  Take 0.5 mg by nebulization 2 (two) times daily.   cetirizine 10 MG tablet Commonly known as: ZYRTEC Take 10 mg by mouth daily.   diltiazem  120 MG tablet Commonly known as: CARDIZEM  Take 120 mg by mouth daily.   empagliflozin  10 MG Tabs tablet Commonly known as: Jardiance  Take 1 tablet (10 mg total) by mouth daily before breakfast.   ferrous sulfate  325 (65 FE) MG EC tablet Take 325 mg by mouth daily with breakfast.   folic acid  1 MG tablet Commonly known as: FOLVITE  Take 1 mg by mouth daily.   furosemide  40 MG tablet Commonly known as: LASIX  Take 1 tablet (40 mg total) by mouth 2 (two) times daily. What changed: Another medication with the same name was removed. Continue taking this medication, and follow the directions you see here.   gabapentin  400 MG capsule Commonly known as: NEURONTIN  Take 400 mg by mouth 3 (three) times daily.   hydroxychloroquine  200 MG tablet Commonly known as: PLAQUENIL  Take 200 mg by mouth daily.   ipratropium-albuterol  0.5-2.5 (3) MG/3ML Soln Commonly known as: DUONEB Take 3 mLs by nebulization every 6 (six) hours.   levothyroxine  175 MCG tablet Commonly known as: SYNTHROID  Take 175 mcg by mouth daily. What changed: Another medication with the same name was removed. Continue taking this medication, and follow the directions you see here.   lidocaine  5  % Commonly known as: LIDODERM  Place 2 patches onto the skin daily. 1 patch on left shoulder and 1 patch on right knee   magnesium  oxide 400 (240 Mg) MG tablet Commonly known as: MAG-OX Take 400 mg by mouth daily.   melatonin 5 MG Tabs Take 5 mg by mouth at bedtime.   montelukast  10 MG tablet Commonly known as: SINGULAIR  Take 10 mg by mouth daily.   multivitamin tablet Take 1 tablet by mouth daily.   naloxone 4 MG/0.1ML Liqd nasal spray kit Commonly known as: NARCAN Place 1 spray into the nose once.   pantoprazole  40 MG tablet Commonly known as: PROTONIX  Take 40 mg by mouth daily.   potassium chloride  SA 20 MEQ tablet Commonly known as: KLOR-CON  M Take 20 mEq by mouth 2 (two) times daily.   predniSONE  5 MG tablet Commonly known as: DELTASONE  Take 5 mg by mouth daily with breakfast.   rivaroxaban  20 MG Tabs tablet Commonly known as: XARELTO  Take 20 mg  by mouth daily.   sennosides-docusate sodium  8.6-50 MG tablet Commonly known as: SENOKOT-S Take 2 tablets by mouth 2 (two) times daily.   sertraline  25 MG tablet Commonly known as: ZOLOFT  Take 3 tablets (75 mg total) by mouth daily.   traMADol  50 MG tablet Commonly known as: ULTRAM  Take 1 tablet (50 mg total) by mouth every 6 (six) hours as needed for severe pain (pain score 7-10).   Trelegy Ellipta 100-62.5-25 MCG/ACT Aepb Generic drug: Fluticasone-Umeclidin-Vilant Inhale 1 puff into the lungs daily.   Zinc Oxide 25 % Pste Apply 1 Application topically daily. Apply to posterior every shift         Discharge Exam: Filed Weights   11/19/24 0500  Weight: 98.1 kg   Blood pressure (!) 94/51, pulse 78, temperature 97.7 F (36.5 C), resp. rate 16, weight 98.1 kg, SpO2 95%.   Gen: NAD, A&Ox3 HEENT: NCAT, PERRLA, EOMI Neck: Supple, no JVD CV: Irregular rhythm, no murmurs Resp: normal WOB, CTAB, no w/r/r, BiPAP mask on Abd: Soft, NTND Ext: Mild swelling of b/l 2/2 chronic changes, pulses 2+ b/l Skin:  Warm, dry Neuro: No focal deficits Psych: Calm, cooperative, appropriate affect  Condition at discharge: good  The results of significant diagnostics from this hospitalization (including imaging, microbiology, ancillary and laboratory) are listed below for reference.   Imaging Studies: DG Chest Portable 1 View Result Date: 11/18/2024 CLINICAL DATA:  Shortness of breath. EXAM: PORTABLE CHEST 1 VIEW COMPARISON:  09/11/2024. FINDINGS: Cardiomegaly, unchanged. Aortic atherosclerosis. Pulmonary vascular congestion. Stable calcified granuloma in the lateral right mid lung. Chronic elevation of the right hemidiaphragm with mild right basilar scarring. Small right pleural effusion cannot be excluded. No pneumothorax. Surgical clips overlying the neck. No acute osseous abnormality. IMPRESSION: Cardiomegaly with pulmonary vascular congestion. Small right pleural effusion cannot be excluded. Electronically Signed   By: Harrietta Sherry M.D.   On: 11/18/2024 13:21    Microbiology: Results for orders placed or performed during the hospital encounter of 09/11/24  Culture, blood (routine x 2)     Status: None   Collection Time: 09/11/24  1:02 AM   Specimen: BLOOD  Result Value Ref Range Status   Specimen Description BLOOD BLOOD RIGHT ARM  Final   Special Requests   Final    BOTTLES DRAWN AEROBIC AND ANAEROBIC Blood Culture adequate volume   Culture   Final    NO GROWTH 5 DAYS Performed at [facility], [address], [place], [place] [address]    Report Status 09/16/2024 FINAL  Final  Culture, blood (routine x 2)     Status: Abnormal   Collection Time: 09/11/24  2:56 AM   Specimen: Right Antecubital; Blood  Result Value Ref Range Status   Specimen Description   Final    RIGHT ANTECUBITAL Performed at [facility], [address] [place], [place] [address]    Special Requests   Final    BOTTLES DRAWN AEROBIC AND ANAEROBIC Blood Culture adequate volume Performed at [facility], [address] [place], [place] [address]    Culture  Setup Time   Final    GRAM POSITIVE COCCI AEROBIC BOTTLE ONLY CRITICAL RESULT CALLED TO, READ BACK BY AND VERIFIED WITH:  NATHAN BELUE AT 9642 09/14/24 JG    Culture (A)  Final    STAPHYLOCOCCUS EPIDERMIDIS THE SIGNIFICANCE OF ISOLATING THIS ORGANISM FROM A SINGLE SET OF BLOOD CULTURES WHEN MULTIPLE SETS ARE DRAWN IS UNCERTAIN. PLEASE NOTIFY THE MICROBIOLOGY DEPARTMENT WITHIN ONE WEEK IF  SPECIATION AND SENSITIVITIES ARE REQUIRED. Performed at [facility], [address] [place], [place] [address]    Report Status 09/16/2024 FINAL  Final  Blood Culture ID Panel (Reflexed)     Status: Abnormal   Collection Time: 09/11/24  2:56 AM  Result Value Ref Range Status   Enterococcus faecalis NOT DETECTED NOT DETECTED Final   Enterococcus Faecium NOT DETECTED NOT DETECTED Final   Listeria monocytogenes NOT DETECTED NOT DETECTED Final   Staphylococcus species DETECTED (A) NOT DETECTED Final    Comment: CRITICAL RESULT CALLED TO, READ BACK BY AND VERIFIED WITH:  NATHAN BELUE AT 9642 09/14/24 JG    Staphylococcus aureus (BCID) NOT DETECTED NOT DETECTED Final   Staphylococcus epidermidis DETECTED (A) NOT DETECTED Final    Comment: Methicillin (oxacillin) resistant coagulase negative staphylococcus. Possible blood culture contaminant (unless isolated from more than one blood culture draw or clinical case suggests pathogenicity). No antibiotic treatment is indicated for blood  culture contaminants. CRITICAL RESULT CALLED TO, READ BACK BY AND VERIFIED WITH:  NATHAN BELUE AT 9642 09/14/24 JG    Staphylococcus lugdunensis NOT DETECTED NOT DETECTED Final   Streptococcus species NOT DETECTED NOT DETECTED Final   Streptococcus agalactiae NOT DETECTED NOT DETECTED Final   Streptococcus pneumoniae NOT DETECTED NOT DETECTED Final   Streptococcus pyogenes NOT DETECTED NOT DETECTED Final   A.calcoaceticus-baumannii NOT DETECTED NOT  DETECTED Final   Bacteroides fragilis NOT DETECTED NOT DETECTED Final   Enterobacterales NOT DETECTED NOT DETECTED Final   Enterobacter cloacae complex NOT DETECTED NOT DETECTED Final   Escherichia coli NOT DETECTED NOT DETECTED Final   Klebsiella aerogenes NOT DETECTED NOT DETECTED Final   Klebsiella oxytoca NOT DETECTED NOT DETECTED Final   Klebsiella pneumoniae NOT DETECTED NOT DETECTED Final   Proteus species NOT DETECTED NOT DETECTED Final   Salmonella species NOT DETECTED NOT DETECTED Final   Serratia marcescens NOT DETECTED NOT DETECTED Final   Haemophilus influenzae NOT DETECTED NOT DETECTED Final   Neisseria meningitidis NOT DETECTED NOT DETECTED Final   Pseudomonas aeruginosa NOT DETECTED NOT DETECTED Final   Stenotrophomonas maltophilia NOT DETECTED NOT DETECTED Final   Candida albicans NOT DETECTED NOT DETECTED Final   Candida auris NOT DETECTED NOT DETECTED Final   Candida glabrata NOT DETECTED NOT DETECTED Final   Candida krusei NOT DETECTED NOT DETECTED Final   Candida parapsilosis NOT DETECTED NOT DETECTED Final   Candida tropicalis NOT DETECTED NOT DETECTED Final   Cryptococcus neoformans/gattii NOT DETECTED NOT DETECTED Final   Methicillin resistance mecA/C DETECTED (A) NOT DETECTED Final    Comment: CRITICAL RESULT CALLED TO, READ BACK BY AND VERIFIED WITH:  NATHAN BELUE AT 0357 09/14/24 JG Performed at [facility], [address] [place], [place] [address]     Labs: CBC: Recent Labs  Lab 11/18/24 1157 11/19/24 0439  WBC 6.1 6.4  HGB 9.3* 9.3*  HCT 31.2* 31.8*  MCV 98.4 98.1  PLT 174 143*   Basic Metabolic Panel: Recent Labs  Lab 11/18/24 1157 11/18/24 1330 11/19/24 0439  NA 140  --  143  K 4.3  --  4.0  CL 102  --  100  CO2 31  --  34*  GLUCOSE 79  --  68*  BUN 13  --  13  CREATININE 0.88  --  1.03*  CALCIUM  9.2  --  9.1  MG  --  2.1  --    Liver Function Tests: No results for input(s): AST, ALT, ALKPHOS, BILITOT,  PROT, ALBUMIN in the last 168 hours. CBG: Recent Labs  Lab 11/19/24 0800  GLUCAP 70    Discharge time spent: Time Coordinating Discharge: I spent a total of 35 minutes engaged in face-to-face discussion with the patient and/or caregivers regarding the patient's care, assessment, plan, and discharge disposition. Over 50% of this time was dedicated to counseling the patient on the risks and benefits of treatment options and the discharge plan, as well as coordinating post-discharge care.   Signed: Jevonte Clanton Al-Sultani, MD [facility] 11/19/2024

## 2024-11-19 NOTE — Plan of Care (Signed)

## 2024-11-19 NOTE — Care Management Obs Status (Signed)
 MEDICARE OBSERVATION STATUS NOTIFICATION   Patient Details  Name: [name] MRN: [id number] Date of Birth: 04/06/1956   Medicare Observation Status Notification Given:  Chaney BRANDY CHRISTIANE LELON, CMA 11/19/2024, 2:09 PM

## 2024-11-25 ENCOUNTER — Encounter: Payer: Self-pay | Admitting: Internal Medicine

## 2025-01-11 ENCOUNTER — Emergency Department

## 2025-01-11 ENCOUNTER — Other Ambulatory Visit: Payer: Self-pay

## 2025-01-11 ENCOUNTER — Inpatient Hospital Stay
Admission: EM | Admit: 2025-01-11 | Discharge: 2025-01-17 | DRG: 291 | Disposition: A | Discharge status: Skilled Nursing Facility | Source: Skilled Nursing Facility | Attending: Internal Medicine | Admitting: Internal Medicine

## 2025-01-11 ENCOUNTER — Inpatient Hospital Stay

## 2025-01-11 DIAGNOSIS — I2489 Other forms of acute ischemic heart disease: Secondary | ICD-10-CM | POA: Diagnosis present

## 2025-01-11 DIAGNOSIS — D67 Hereditary factor IX deficiency: Secondary | ICD-10-CM | POA: Diagnosis present

## 2025-01-11 DIAGNOSIS — Z87891 Personal history of nicotine dependence: Secondary | ICD-10-CM

## 2025-01-11 DIAGNOSIS — M069 Rheumatoid arthritis, unspecified: Secondary | ICD-10-CM | POA: Diagnosis present

## 2025-01-11 DIAGNOSIS — J9621 Acute and chronic respiratory failure with hypoxia: Secondary | ICD-10-CM | POA: Diagnosis present

## 2025-01-11 DIAGNOSIS — G4733 Obstructive sleep apnea (adult) (pediatric): Secondary | ICD-10-CM | POA: Diagnosis present

## 2025-01-11 DIAGNOSIS — E7800 Pure hypercholesterolemia, unspecified: Secondary | ICD-10-CM | POA: Diagnosis present

## 2025-01-11 DIAGNOSIS — Z8419 Family history of other disorders of kidney and ureter: Secondary | ICD-10-CM

## 2025-01-11 DIAGNOSIS — Z79890 Hormone replacement therapy (postmenopausal): Secondary | ICD-10-CM | POA: Diagnosis not present

## 2025-01-11 DIAGNOSIS — M25552 Pain in left hip: Secondary | ICD-10-CM | POA: Diagnosis present

## 2025-01-11 DIAGNOSIS — R0602 Shortness of breath: Secondary | ICD-10-CM

## 2025-01-11 DIAGNOSIS — K219 Gastro-esophageal reflux disease without esophagitis: Secondary | ICD-10-CM | POA: Diagnosis present

## 2025-01-11 DIAGNOSIS — I509 Heart failure, unspecified: Principal | ICD-10-CM

## 2025-01-11 DIAGNOSIS — E66813 Obesity, class 3: Secondary | ICD-10-CM | POA: Diagnosis present

## 2025-01-11 DIAGNOSIS — Z8249 Family history of ischemic heart disease and other diseases of the circulatory system: Secondary | ICD-10-CM

## 2025-01-11 DIAGNOSIS — Z7901 Long term (current) use of anticoagulants: Secondary | ICD-10-CM | POA: Diagnosis not present

## 2025-01-11 DIAGNOSIS — Z7984 Long term (current) use of oral hypoglycemic drugs: Secondary | ICD-10-CM

## 2025-01-11 DIAGNOSIS — I480 Paroxysmal atrial fibrillation: Secondary | ICD-10-CM | POA: Diagnosis present

## 2025-01-11 DIAGNOSIS — M8580 Other specified disorders of bone density and structure, unspecified site: Secondary | ICD-10-CM | POA: Diagnosis present

## 2025-01-11 DIAGNOSIS — J439 Emphysema, unspecified: Secondary | ICD-10-CM | POA: Diagnosis present

## 2025-01-11 DIAGNOSIS — Z7951 Long term (current) use of inhaled steroids: Secondary | ICD-10-CM

## 2025-01-11 DIAGNOSIS — Z7952 Long term (current) use of systemic steroids: Secondary | ICD-10-CM

## 2025-01-11 DIAGNOSIS — Z9049 Acquired absence of other specified parts of digestive tract: Secondary | ICD-10-CM

## 2025-01-11 DIAGNOSIS — Z6839 Body mass index (BMI) 39.0-39.9, adult: Secondary | ICD-10-CM | POA: Diagnosis not present

## 2025-01-11 DIAGNOSIS — Z86711 Personal history of pulmonary embolism: Secondary | ICD-10-CM

## 2025-01-11 DIAGNOSIS — Z79899 Other long term (current) drug therapy: Secondary | ICD-10-CM

## 2025-01-11 DIAGNOSIS — J9612 Chronic respiratory failure with hypercapnia: Secondary | ICD-10-CM | POA: Diagnosis present

## 2025-01-11 DIAGNOSIS — D631 Anemia in chronic kidney disease: Secondary | ICD-10-CM | POA: Diagnosis present

## 2025-01-11 DIAGNOSIS — Z1152 Encounter for screening for COVID-19: Secondary | ICD-10-CM

## 2025-01-11 DIAGNOSIS — I5033 Acute on chronic diastolic (congestive) heart failure: Secondary | ICD-10-CM | POA: Diagnosis present

## 2025-01-11 DIAGNOSIS — Z993 Dependence on wheelchair: Secondary | ICD-10-CM

## 2025-01-11 DIAGNOSIS — I130 Hypertensive heart and chronic kidney disease with heart failure and stage 1 through stage 4 chronic kidney disease, or unspecified chronic kidney disease: Principal | ICD-10-CM | POA: Diagnosis present

## 2025-01-11 DIAGNOSIS — N183 Chronic kidney disease, stage 3 unspecified: Secondary | ICD-10-CM | POA: Diagnosis present

## 2025-01-11 DIAGNOSIS — E871 Hypo-osmolality and hyponatremia: Secondary | ICD-10-CM | POA: Diagnosis present

## 2025-01-11 DIAGNOSIS — Z833 Family history of diabetes mellitus: Secondary | ICD-10-CM

## 2025-01-11 LAB — CBC WITH DIFFERENTIAL/PLATELET
Abs Immature Granulocytes: 0.36 10*3/uL — ABNORMAL HIGH (ref 0.00–0.07)
Basophils Absolute: 0.1 10*3/uL (ref 0.0–0.1)
Basophils Relative: 1 %
Eosinophils Absolute: 0.1 10*3/uL (ref 0.0–0.5)
Eosinophils Relative: 1 %
HCT: 29.9 % — ABNORMAL LOW (ref 36.0–46.0)
Hemoglobin: 8.8 g/dL — ABNORMAL LOW (ref 12.0–15.0)
Immature Granulocytes: 3 %
Lymphocytes Relative: 6 %
Lymphs Abs: 0.6 10*3/uL — ABNORMAL LOW (ref 0.7–4.0)
MCH: 27.5 pg (ref 26.0–34.0)
MCHC: 29.4 g/dL — ABNORMAL LOW (ref 30.0–36.0)
MCV: 93.4 fL (ref 80.0–100.0)
Monocytes Absolute: 1 10*3/uL (ref 0.1–1.0)
Monocytes Relative: 10 %
Neutro Abs: 8.4 10*3/uL — ABNORMAL HIGH (ref 1.7–7.7)
Neutrophils Relative %: 79 %
Platelets: 270 10*3/uL (ref 150–400)
RBC: 3.2 MIL/uL — ABNORMAL LOW (ref 3.87–5.11)
RDW: 15.3 % (ref 11.5–15.5)
WBC: 10.6 10*3/uL — ABNORMAL HIGH (ref 4.0–10.5)
nRBC: 0.2 % (ref 0.0–0.2)

## 2025-01-11 LAB — COMPREHENSIVE METABOLIC PANEL WITH GFR
ALT: 11 U/L (ref 0–44)
AST: 23 U/L (ref 15–41)
Albumin: 3.3 g/dL — ABNORMAL LOW (ref 3.5–5.0)
Alkaline Phosphatase: 99 U/L (ref 38–126)
Anion gap: 16 — ABNORMAL HIGH (ref 5–15)
BUN: 33 mg/dL — ABNORMAL HIGH (ref 8–23)
CO2: 27 mmol/L (ref 22–32)
Calcium: 9 mg/dL (ref 8.9–10.3)
Chloride: 91 mmol/L — ABNORMAL LOW (ref 98–111)
Creatinine, Ser: 1.35 mg/dL — ABNORMAL HIGH (ref 0.44–1.00)
GFR, Estimated: 43 mL/min — ABNORMAL LOW
Glucose, Bld: 85 mg/dL (ref 70–99)
Potassium: 4.2 mmol/L (ref 3.5–5.1)
Sodium: 133 mmol/L — ABNORMAL LOW (ref 135–145)
Total Bilirubin: 1.4 mg/dL — ABNORMAL HIGH (ref 0.0–1.2)
Total Protein: 7.3 g/dL (ref 6.5–8.1)

## 2025-01-11 LAB — TROPONIN T, HIGH SENSITIVITY: Troponin T High Sensitivity: 66 ng/L — ABNORMAL HIGH (ref 0–19)

## 2025-01-11 LAB — BLOOD GAS, VENOUS
Acid-Base Excess: 2.3 mmol/L — ABNORMAL HIGH (ref 0.0–2.0)
Bicarbonate: 29.5 mmol/L — ABNORMAL HIGH (ref 20.0–28.0)
O2 Saturation: 97.4 %
Patient temperature: 37
pCO2, Ven: 56 mmHg (ref 44–60)
pH, Ven: 7.33 (ref 7.25–7.43)
pO2, Ven: 93 mmHg — ABNORMAL HIGH (ref 32–45)

## 2025-01-11 LAB — RESP PANEL BY RT-PCR (RSV, FLU A&B, COVID)  RVPGX2
Influenza A by PCR: NEGATIVE
Influenza B by PCR: NEGATIVE
Resp Syncytial Virus by PCR: NEGATIVE
SARS Coronavirus 2 by RT PCR: NEGATIVE

## 2025-01-11 LAB — MAGNESIUM: Magnesium: 2.3 mg/dL (ref 1.7–2.4)

## 2025-01-11 LAB — PRO BRAIN NATRIURETIC PEPTIDE: Pro Brain Natriuretic Peptide: 3962 pg/mL — ABNORMAL HIGH

## 2025-01-11 LAB — LIPASE, BLOOD: Lipase: 15 U/L (ref 11–51)

## 2025-01-11 MED ORDER — IPRATROPIUM-ALBUTEROL 0.5-2.5 (3) MG/3ML IN SOLN
3.0000 mL | Freq: Four times a day (QID) | RESPIRATORY_TRACT | Status: DC | PRN
  Filled 2025-01-11: qty 3

## 2025-01-11 MED ORDER — BUDESON-GLYCOPYRROL-FORMOTEROL 160-9-4.8 MCG/ACT IN AERO
2.0000 | INHALATION_SPRAY | Freq: Two times a day (BID) | RESPIRATORY_TRACT | Status: DC
  Administered 2025-01-15 – 2025-01-17 (×3): 2 via RESPIRATORY_TRACT
  Filled 2025-01-11 (×2): qty 5.9

## 2025-01-11 MED ORDER — SERTRALINE HCL 50 MG PO TABS
75.0000 mg | ORAL_TABLET | Freq: Every day | ORAL | Status: DC
  Administered 2025-01-11 – 2025-01-15 (×5): 75 mg via ORAL
  Filled 2025-01-11 (×5): qty 2

## 2025-01-11 MED ORDER — RIVAROXABAN 20 MG PO TABS
20.0000 mg | ORAL_TABLET | Freq: Every day | ORAL | Status: DC
  Administered 2025-01-12 – 2025-01-17 (×6): 20 mg via ORAL
  Filled 2025-01-11 (×6): qty 1

## 2025-01-11 MED ORDER — EMPAGLIFLOZIN 10 MG PO TABS: 10.0000 mg | ORAL_TABLET | Freq: Every day | ORAL | Status: DC

## 2025-01-11 MED ORDER — FUROSEMIDE 10 MG/ML IJ SOLN
40.0000 mg | Freq: Two times a day (BID) | INTRAMUSCULAR | Status: DC
  Administered 2025-01-11 – 2025-01-15 (×9): 40 mg via INTRAVENOUS
  Filled 2025-01-11 (×9): qty 4

## 2025-01-11 MED ORDER — LEVOTHYROXINE SODIUM 50 MCG PO TABS
175.0000 ug | ORAL_TABLET | Freq: Every day | ORAL | Status: DC
  Administered 2025-01-12 – 2025-01-17 (×6): 175 ug via ORAL
  Filled 2025-01-11 (×6): qty 1

## 2025-01-11 MED ORDER — PREDNISONE 10 MG PO TABS
5.0000 mg | ORAL_TABLET | Freq: Every day | ORAL | Status: DC
  Administered 2025-01-12 – 2025-01-17 (×6): 5 mg via ORAL
  Filled 2025-01-11 (×6): qty 1

## 2025-01-11 MED ORDER — ACETAMINOPHEN 325 MG PO TABS
650.0000 mg | ORAL_TABLET | Freq: Four times a day (QID) | ORAL | Status: DC | PRN
  Administered 2025-01-11 – 2025-01-16 (×7): 650 mg via ORAL
  Filled 2025-01-11 (×7): qty 2

## 2025-01-11 MED ORDER — MONTELUKAST SODIUM 10 MG PO TABS
10.0000 mg | ORAL_TABLET | Freq: Every day | ORAL | Status: DC
  Administered 2025-01-11 – 2025-01-16 (×6): 10 mg via ORAL
  Filled 2025-01-11 (×6): qty 1

## 2025-01-11 MED ORDER — FUROSEMIDE 10 MG/ML IJ SOLN
INTRAMUSCULAR | Status: AC
  Administered 2025-01-11: 20 mg via INTRAVENOUS
  Filled 2025-01-11: qty 4

## 2025-01-11 MED ORDER — ATORVASTATIN CALCIUM 10 MG PO TABS
10.0000 mg | ORAL_TABLET | Freq: Every day | ORAL | Status: DC
  Administered 2025-01-11 – 2025-01-16 (×6): 10 mg via ORAL
  Filled 2025-01-11 (×6): qty 1

## 2025-01-11 MED ORDER — GABAPENTIN 400 MG PO CAPS
400.0000 mg | ORAL_CAPSULE | Freq: Three times a day (TID) | ORAL | Status: DC
  Administered 2025-01-11 – 2025-01-15 (×12): 400 mg via ORAL
  Filled 2025-01-11 (×12): qty 1

## 2025-01-11 MED ORDER — FUROSEMIDE 10 MG/ML IJ SOLN: 20.0000 mg | Freq: Once | INTRAMUSCULAR | Status: DC

## 2025-01-11 MED ORDER — ALBUMIN HUMAN 25 % IV SOLN
25.0000 g | Freq: Once | INTRAVENOUS | Status: AC
  Administered 2025-01-11: 25 g via INTRAVENOUS
  Filled 2025-01-11: qty 100

## 2025-01-11 MED ORDER — HYDROXYZINE HCL 25 MG PO TABS
25.0000 mg | ORAL_TABLET | Freq: Three times a day (TID) | ORAL | Status: DC | PRN
  Administered 2025-01-14: 25 mg via ORAL
  Filled 2025-01-11: qty 1

## 2025-01-11 MED ORDER — LORATADINE 10 MG PO TABS
10.0000 mg | ORAL_TABLET | Freq: Every day | ORAL | Status: DC
  Administered 2025-01-12 – 2025-01-15 (×4): 10 mg via ORAL
  Filled 2025-01-11 (×4): qty 1

## 2025-01-11 MED ORDER — FUROSEMIDE 40 MG PO TABS: 20.0000 mg | ORAL_TABLET | Freq: Once | ORAL | Status: DC

## 2025-01-11 MED ORDER — BUDESONIDE 0.5 MG/2ML IN SUSP
0.5000 mg | Freq: Two times a day (BID) | RESPIRATORY_TRACT | Status: DC
  Administered 2025-01-11 – 2025-01-17 (×12): 0.5 mg via RESPIRATORY_TRACT
  Filled 2025-01-11 (×12): qty 2

## 2025-01-11 MED ORDER — HYDROXYCHLOROQUINE SULFATE 200 MG PO TABS
200.0000 mg | ORAL_TABLET | Freq: Every day | ORAL | Status: DC
  Administered 2025-01-12 – 2025-01-17 (×6): 200 mg via ORAL
  Filled 2025-01-11 (×6): qty 1

## 2025-01-11 MED ORDER — PANTOPRAZOLE SODIUM 40 MG PO TBEC
40.0000 mg | DELAYED_RELEASE_TABLET | Freq: Every day | ORAL | Status: DC
  Administered 2025-01-12 – 2025-01-17 (×6): 40 mg via ORAL
  Filled 2025-01-11 (×6): qty 1

## 2025-01-11 MED ORDER — MELATONIN 5 MG PO TABS
5.0000 mg | ORAL_TABLET | Freq: Every day | ORAL | Status: DC
  Administered 2025-01-11 – 2025-01-14 (×4): 5 mg via ORAL
  Filled 2025-01-11 (×4): qty 1

## 2025-01-11 MED ORDER — DILTIAZEM HCL 30 MG PO TABS
120.0000 mg | ORAL_TABLET | Freq: Every day | ORAL | Status: DC
  Administered 2025-01-12 – 2025-01-17 (×6): 120 mg via ORAL
  Filled 2025-01-11 (×6): qty 4

## 2025-01-11 NOTE — ED Provider Notes (Signed)
 "  [facility] Provider Note    Event Date/Time   First MD Initiated Contact with Patient 01/11/25 1305     (approximate)   History   Shortness of Breath   HPI  [name] is a 69 y.o. female  MHx of chronic hypoxic hypercapnic respite failure on 6 L and BiPAP at home, HTN, HLD, CHF, paroxysmal A-fib who presented to the ED with 2 weeks of progressively worsening shortness of breath despite being on her home oxygen of 6 L and increasing generalized weakness.  Patient denies any chest pain.  Reports compliance of all of her medications at her facility.  She did have a fall 2 weeks ago and reports pain in her left hip (this is in contrast to the triage note which states left knee).  She does have a cough but has not had any measured fevers.     Physical Exam   Triage Vital Signs: ED Triage Vitals  Encounter Vitals Group     BP 01/11/25 1305 (!) 100/55     Girls Systolic BP Percentile --      Girls Diastolic BP Percentile --      Boys Systolic BP Percentile --      Boys Diastolic BP Percentile --      Pulse Rate 01/11/25 1305 87     Resp 01/11/25 1305 20     Temp 01/11/25 1305 97.6 F (36.4 C)     Temp Source 01/11/25 1305 Oral     SpO2 01/11/25 1305 91 %     Weight --      Height --      Head Circumference --      [place] --      Pain Score 01/11/25 1303 9     Pain Loc --      Pain Education --      Exclude from Growth Chart --     Most recent vital signs: Vitals:   01/11/25 1500 01/11/25 1554  BP: 93/71 108/65  Pulse: 84 81  Resp: 19 17  Temp:  97.8 F (36.6 C)  SpO2: 92% 93%    Nursing Triage Note reviewed. Vital signs reviewed and patients oxygen saturation is borderline hypoxic  General: Patient is well nourished, well developed, awake and alert, appears chronically ill Head: Normocephalic and atraumatic Eyes: Normal inspection, extraocular muscles intact, no conjunctival pallor Ear, nose, throat: Normal external exam Neck:  Normal range of motion Respiratory: Patient is in mild respiratory distress, lungs rales Cardiovascular: Patient is not tachycardic, RR GI: Abd SNT with no guarding or rebound  Back: Normal inspection of the back with good strength and range of motion throughout all ext Extremities: pulses intact with good cap refills, 1+ lower extremity ecchymosis and bruising over left posterior thigh Neuro: The patient is alert and oriented to person, place, and time, appropriately conversive, with 5/5 bilat UE/LE strength, no gross motor or sensory defects noted. Coordination appears to be adequate. Skin: Warm, dry, and intact Psych: normal mood and affect, no SI or HI  ED Results / Procedures / Treatments   Labs (all labs ordered are listed, but only abnormal results are displayed) Labs Reviewed  CBC WITH DIFFERENTIAL/PLATELET - Abnormal; Notable for the following components:      Result Value   WBC 10.6 (*)    RBC 3.20 (*)    Hemoglobin 8.8 (*)    HCT 29.9 (*)    MCHC 29.4 (*)    Neutro  Abs 8.4 (*)    Lymphs Abs 0.6 (*)    Abs Immature Granulocytes 0.36 (*)    All other components within normal limits  COMPREHENSIVE METABOLIC PANEL WITH GFR - Abnormal; Notable for the following components:   Sodium 133 (*)    Chloride 91 (*)    BUN 33 (*)    Creatinine, Ser 1.35 (*)    Albumin  3.3 (*)    Total Bilirubin 1.4 (*)    GFR, Estimated 43 (*)    Anion gap 16 (*)    All other components within normal limits  PRO BRAIN NATRIURETIC PEPTIDE - Abnormal; Notable for the following components:   Pro Brain Natriuretic Peptide 3,962.0 (*)    All other components within normal limits  BLOOD GAS, VENOUS - Abnormal; Notable for the following components:   pO2, Ven 93 (*)    Bicarbonate 29.5 (*)    Acid-Base Excess 2.3 (*)    All other components within normal limits  TROPONIN T, HIGH SENSITIVITY - Abnormal; Notable for the following components:   Troponin T High Sensitivity 66 (*)    All other  components within normal limits  RESP PANEL BY RT-PCR (RSV, FLU A&B, COVID)  RVPGX2  LIPASE, BLOOD  MAGNESIUM   PROCALCITONIN  TROPONIN T, HIGH SENSITIVITY     EKG EKG and rhythm strip are interpreted by myself:   EKG: [Normal sinus rhythm] at heart rate of 81, normal QRS duration, QTc 597, nonspecific ST segments and T waves no ectopy EKG not consistent with Acute STEMI Rhythm strip: NSR in lead II   RADIOLOGY CXR: No acute abnormality on my independent review interpretation radiologist reads possible hazy artifact Left hip: No acute fracture    PROCEDURES:  Critical Care performed: No  Procedures   MEDICATIONS ORDERED IN ED: Medications - No data to display    IMPRESSION / MDM / ASSESSMENT AND PLAN / ED COURSE                                Differential diagnosis includes, but is not limited to, CHF exacerbation, pneumonia, electrolyte derangement, anemia, arrhythmia  ED course: Patient presents acutely but is maintaining her oxygen on the 6 L at this time.  She does have a very mild leukocytosis.  She has no profound electrolyte derangements and bilirubin is only very mildly elevated which I suspect is secondary to portal hypertension.  She has no hypercarbia and acidosis on blood gas.  Her BNP is elevated to will much higher than her baseline.  I thought about initiating Lasix  however patient did have a drop in her blood pressure.  X-ray of the left hip was unremarkable and I suspect she has ecchymosis secondary to contusion, but will order a Doppler ultrasound to ensure no DVT (albeit patient is on blood thinner at baseline). COVID swab was unremarkable.  Will call the patient in for admission   Clinical Course as of 01/11/25 1602  Tue Jan 11, 2025  1412 Pro Brain Natriuretic Peptide(!): 3,962.0 [HD]  1516 Bicarbonate(!): 29.5 [HD]  1517 Pro Brain natriuretic peptide(!) More than baseline [HD]  1523 Comprehensive metabolic panel(!) No AKI [HD]  1523 CBC with  Differential(!) Only very mild leukocytosis [HD]  1551 Case discussed with hospitalist for admission [HD]    Clinical Course User Index [HD] Nicholaus Rolland BRAVO, MD   -- Risk: 5 This patient has a high risk of morbidity due to further diagnostic  testing or treatment. Rationale: This patients evaluation and management involve a high risk of morbidity due to the potential severity of presenting symptoms, need for diagnostic testing, and/or initiation of treatment that may require close monitoring. The differential includes conditions with potential for significant deterioration or requiring escalation of care. Treatment decisions in the ED, including medication administration, procedural interventions, or disposition planning, reflect this level of risk. COPA: 5 The patient has the following acute or chronic illness/injury that poses a possible threat to life or bodily function: [X] : The patient has a potentially serious acute condition or an acute exacerbation of a chronic illness requiring urgent evaluation and management in the Emergency Department. The clinical presentation necessitates immediate consideration of life-threatening or function-threatening diagnoses, even if they are ultimately ruled out.   FINAL CLINICAL IMPRESSION(S) / ED DIAGNOSES   Final diagnoses:  Congestive heart failure, unspecified HF chronicity, unspecified heart failure type (HCC)  Shortness of breath  Pain of left hip     Rx / DC Orders   ED Discharge Orders     None        Note:  This document was prepared using Dragon voice recognition software and may include unintentional dictation errors.   Nicholaus Rolland BRAVO, MD 01/11/25 [phone]  "

## 2025-01-11 NOTE — ED Notes (Signed)
 Pt had large b/m. Pt cleaned and new bed linen applied along with new brief.

## 2025-01-11 NOTE — ED Triage Notes (Addendum)
 From facility, c/o [facility] x 2 weeks that has been getting worse. She had a fall  2 weeks ago with c/o L knee. Pt states the pain has increased since the Fall to L knee and having posterior leg pain. Baseline O2 @ 6L. On EMS arrival patient sating in the 27s. States she was never x rayed for initial fall.

## 2025-01-11 NOTE — Progress Notes (Signed)
" °   01/11/25 2310  BiPAP/CPAP/SIPAP  $ Non-Invasive Ventilator  Non-Invasive Vent Initial  $ Face Mask Medium Yes  BiPAP/CPAP/SIPAP Pt Type Adult  BiPAP/CPAP/SIPAP Resmed  Mask Type Full face mask  Mask Size Medium  Respiratory Rate 19 breaths/min  IPAP  (auto)  EPAP  (auto)  Flow Rate 6 lpm  Patient Home Machine No  Patient Home Mask No  Patient Home Tubing No  Auto Titrate Yes  Minimum cmH2O 20 cmH2O  Maximum cmH2O 5 cmH2O  Device Plugged into RED Power Outlet Yes    Patient states she wears BIPAP QHS at home "

## 2025-01-11 NOTE — H&P (Addendum)
 " History and Physical    Patient: [name] FMW:[id number] DOB: Sep 27, 1956 DOA: 01/11/2025 DOS: the patient was seen and examined on 01/11/2025 PCP: [facility]  Patient coming from: [facility]  Chief Complaint: SOB Chief Complaint  Patient presents with   Shortness of Breath   HPI: [name] is a 69 y.o. female with medical history significant of chronic hypoxic hypercapnic respiratory failure on 6 L and BiPAP at home, HTN, HLD, CHF, paroxysmal A-fib presented to the ED on 01/11/2025 from [facility] due to progressively worsening SOB since 2 weeks and increased generalized weakness.  Patient reports she had a fall from the wheelchair 2 weeks ago, since then has pain in the left side of the hip and groin, reports mechanical fall when she tripped off with the wheelchair.  Denies chest pain, fever, chills, dizziness, sick contacts. Reports patient is compliant with her medication.  Wheelchair-bound at baseline  On presentation to ED found to be afebrile, hemodynamically stable, saturating 94% on 6 L [place] Workup revealed CMP with  mild hyponatremia 133, BUN/creatinine 33/1.35 and anion gap 16, LFTs normal with T. bili 1.4. CBC with mild leukocytosis 10.6, Hb 8.8 normocytic BNP elevated 3962, with VBG showing pH 7.33, pCO2 56 Troponin 66, pending repeat. COVID/flu/RSV negative, procalcitonin pending CXR shows hazy opacity at right lung base, may be artifactual due to elevation of right hemidiaphragm vs pleural effusion and airspace disease. XR left hip shows diffuse osteopenia.  No acute fractures. US  left lower extremity rule out DVT pending  Review of Systems: As mentioned in the history of present illness. All other systems reviewed and are negative. 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    HTN (hypertension)    Hypercholesteremia    Hypothyroid    Rheumatoid arthritis(714.0)    Varicose vein of leg    Past Surgical History:  Procedure Laterality Date   APPENDECTOMY     CHOLECYSTECTOMY     ELBOW SURGERY     LEFT   ENDOSCOPIC VEIN LASER TREATMENT     RIGHT LEG   EXPLORATORY LAPAROTOMY     HAND SURGERY     RIGHT    HEEL SPUR SURGERY     BILAT   INSERTION OF MESH N/A 08/04/2017   Procedure: INSERTION OF MESH;  Surgeon: Eletha Boas, MD;  Location: [facility] OR;  Service: General;  Laterality: N/A;   partial thyroidectomy  2015   TONSILLECTOMY     TUBAL LIGATION     VENTRAL HERNIA REPAIR N/A 08/04/2017   Procedure: LAPAROSCOPIC VENTRAL INCISIONAL HERNIA REPAIR WITH MESH;  Surgeon: Eletha Boas, MD;  Location: [facility] OR;  Service: General;  Laterality: N/A;   Social History:  reports that she quit smoking about 5 years ago. Her smoking use included cigarettes. She started smoking about 45 years ago. She has a 40 pack-year smoking history. She has never used smokeless tobacco. She reports that she does not drink alcohol and does not use drugs.  Allergies[1]  Family History  Problem Relation Age of Onset   Diabetes Mother    Kidney disease Mother    Lung cancer Father    Alzheimer's disease Maternal Grandmother    Stroke Maternal Grandfather    Heart disease  Paternal Grandmother    Unexplained death Paternal Grandfather     Prior to Admission medications  Medication Sig Start Date End Date Taking? Authorizing Provider  acetaminophen  (TYLENOL ) 500 MG tablet Take 500 mg by mouth every 8 (eight) hours as needed for mild pain (pain score 1-3).    [provider]  atorvastatin  (LIPITOR) 10 MG tablet Take 10 mg by mouth at bedtime.    [provider]  benzonatate (TESSALON) 100 MG capsule Take 100 mg by mouth every 8 (eight) hours as needed for cough.    [provider]  budesonide  (PULMICORT ) 0.5 MG/2ML nebulizer solution Take  0.5 mg by nebulization 2 (two) times daily.    [provider]  cetirizine (ZYRTEC) 10 MG tablet Take 10 mg by mouth daily.    [provider]  diltiazem  (CARDIZEM ) 120 MG tablet Take 120 mg by mouth daily. 08/31/24   [provider]  empagliflozin  (JARDIANCE ) 10 MG TABS tablet Take 1 tablet (10 mg total) by mouth daily before breakfast. 07/27/24   Donette Ellouise LABOR, FNP  ferrous sulfate  325 (65 FE) MG EC tablet Take 325 mg by mouth daily with breakfast.    [provider]  folic acid  (FOLVITE ) 1 MG tablet Take 1 mg by mouth daily.    [provider]  furosemide  (LASIX ) 40 MG tablet Take 1 tablet (40 mg total) by mouth 2 (two) times daily. 09/14/24   Josette Ade, MD  gabapentin  (NEURONTIN ) 400 MG capsule Take 400 mg by mouth 3 (three) times daily.    [provider]  hydroxychloroquine  (PLAQUENIL ) 200 MG tablet Take 200 mg by mouth daily.    [provider]  ipratropium-albuterol  (DUONEB) 0.5-2.5 (3) MG/3ML SOLN Take 3 mLs by nebulization every 6 (six) hours.    [provider]  levothyroxine  (SYNTHROID ) 175 MCG tablet Take 175 mcg by mouth daily. 10/18/24   [provider]  lidocaine  (LIDODERM ) 5 % Place 2 patches onto the skin daily. 1 patch on left shoulder and 1 patch on right knee 09/03/24   [provider]  magnesium  oxide (MAG-OX) 400 (240 Mg) MG tablet Take 400 mg by mouth daily.    [provider]  melatonin 5 MG TABS Take 5 mg by mouth at bedtime.    [provider]  montelukast  (SINGULAIR ) 10 MG tablet Take 10 mg by mouth daily.    [provider]  Multiple Vitamin (MULTIVITAMIN) tablet Take 1 tablet by mouth daily.    [provider]  naloxone [facility]) nasal spray 4 mg/0.1 mL Place 1 spray into the nose once.    [provider]  pantoprazole  (PROTONIX ) 40 MG tablet Take 40 mg by mouth daily.    [provider]  potassium chloride  SA (KLOR-CON  M)  20 MEQ tablet Take 20 mEq by mouth 2 (two) times daily. 08/27/24   [provider]  predniSONE  (DELTASONE ) 5 MG tablet Take 5 mg by mouth daily with breakfast.    [provider]  rivaroxaban  (XARELTO ) 20 MG TABS tablet Take 20 mg by mouth daily.    [provider]  sennosides-docusate sodium  (SENOKOT-S) 8.6-50 MG tablet Take 2 tablets by mouth 2 (two) times daily.    [provider]  sertraline  (ZOLOFT ) 25 MG tablet Take 3 tablets (75 mg total) by mouth daily. 07/01/24   Caleen Qualia, MD  TRELEGY ELLIPTA 100-62.5-25 MCG/ACT AEPB Inhale 1 puff into the lungs daily. 08/23/24   [provider]  Zinc Oxide  25 % PSTE Apply 1 Application topically daily. Apply to posterior every shift    [provider]    Physical Exam: Vitals:   01/11/25 1554 01/11/25 1600 01/11/25 1615 01/11/25 1630  BP: 108/65 (!) 92/47 110/60 105/62  Pulse: 81 82 88 81  Resp: 17 (!) 26 (!) 23 (!) 31  Temp: 97.8 F (36.6 C)     TempSrc: Oral     SpO2: 93% 94% (!) 81% 98%   General: Patient is well nourished, well developed, awake and alert, appears chronically ill Neck: Normal range of motion, unable to assess JVD Respiratory: Bibasilar crackles, no wheezing, mild tachypnea Cardiovascular: RRR GI: Soft, nontender, distended at baseline Extremities: pulses intact with good cap refills, 1+ lower extremity ecchymosis and bruising over left posterior thigh Neuro: AO x 3, no gross focal deficits Skin: Warm, dry, and intact Psych: normal mood and affect, no SI or HI  Data Reviewed: Labs and Imaging reviewed  Assessment and Plan: Acute on chronic HFpEF Presented with progressively worsening SOB since 2 weeks, BNP elevated above baseline Echo 06/25 shows EF 55 to 60%, no RWMA, grade 2 diastolic dysfunction.  Moderate MR Start IV Lasix  40 mg bid IV albumin  for soft BP Daily weights, monitor intake/output  Elevated troponin Denies chest pain, EKG with no ischemic  changes Troponin 66 Repeat troponin pending  Chronic hypoxic and hypercapnic respiratory failure On 6 L [place] and BiPAP at home, at baseline Goal saturation 88-92 %  Left hip and thigh pain XR left hip shows diffuse osteopenia.  No acute fractures CT left Hip pending US  LLE r/o DVT pending Pain management with Tylenol   Mild leukocytosis No evidence of pneumonia clinically, denies fever/chills, cough, sick contact Flu/COVID/RSV negative Procalcitonin pending Monitor off abx  CKD stage 3 Monitor Cr  COPD, not in exacerbation Continue home inhalers  A-fib Continue Xarelto , Diltiazem   Hypothyroidism Continue home Synthroid   Rheumatoid arthritis Continue Plaquenil   HLD Continue statin  GERD Continue protonix   History of PE Continue Xarelto   OSA on CPAP CPAP at night  Obesity class III Discussed lifestyle modification   DVT ppx: Xarelto   Advance Care Planning:   Code Status: Full Code -discussed at the bedside  Consults: None  Family Communication: None present at the bedside  Severity of Illness: The appropriate patient status for this patient is INPATIENT. Inpatient status is judged to be reasonable and necessary in order to provide the required intensity of service to ensure the patient's safety. The patient's presenting symptoms, physical exam findings, and initial radiographic and laboratory data in the context of their chronic comorbidities is felt to place them at high risk for further clinical deterioration. Furthermore, it is not anticipated that the patient will be medically stable for discharge from the hospital within 2 midnights of admission.   * I certify that at the point of admission it is my clinical judgment that the patient will require inpatient hospital care spanning beyond 2 midnights from the point of admission due to high intensity of service, high risk for further deterioration and high frequency of surveillance  required.*  Author: Laree Lock, MD 01/11/2025 5:18 PM  For on call review [email or web address].      [1]  Allergies Allergen Reactions   Latex Hives, Itching and Dermatitis    Blisters (also)  Skin blisters   Oxycodone -Acetaminophen  Nausea Only and Other (See Comments)    Violent Vomiting Violent Vomiting   Penicillins Anaphylaxis, Swelling and Rash    Has patient had a PCN  reaction causing immediate rash, facial/tongue/throat swelling, SOB or lightheadedness with hypotension: Yes Has patient had a PCN reaction causing severe rash involving mucus membranes or skin necrosis: No Has patient had a PCN reaction that required hospitalization No Has patient had a PCN reaction occurring within the last 10 years: No If all of the above answers are NO, then may proceed with Cephalosporin use.    Strawberry Extract Hives, Itching, Rash, Anaphylaxis, Dermatitis and Swelling   Oxycodone -Acetaminophen  Nausea And Vomiting   Tyloxapol Nausea And Vomiting   Adhesive [Tape] Rash    Patient prefers paper tape   Wound Dressing Adhesive Dermatitis   "

## 2025-01-12 DIAGNOSIS — I5033 Acute on chronic diastolic (congestive) heart failure: Secondary | ICD-10-CM | POA: Diagnosis not present

## 2025-01-12 LAB — CBC
HCT: 25.9 % — ABNORMAL LOW (ref 36.0–46.0)
Hemoglobin: 7.7 g/dL — ABNORMAL LOW (ref 12.0–15.0)
MCH: 27.2 pg (ref 26.0–34.0)
MCHC: 29.7 g/dL — ABNORMAL LOW (ref 30.0–36.0)
MCV: 91.5 fL (ref 80.0–100.0)
Platelets: 223 10*3/uL (ref 150–400)
RBC: 2.83 MIL/uL — ABNORMAL LOW (ref 3.87–5.11)
RDW: 15.3 % (ref 11.5–15.5)
WBC: 7.1 10*3/uL (ref 4.0–10.5)
nRBC: 0.3 % — ABNORMAL HIGH (ref 0.0–0.2)

## 2025-01-12 LAB — TROPONIN T, HIGH SENSITIVITY: Troponin T High Sensitivity: 62 ng/L — ABNORMAL HIGH (ref 0–19)

## 2025-01-12 LAB — BASIC METABOLIC PANEL WITH GFR
Anion gap: 11 (ref 5–15)
BUN: 32 mg/dL — ABNORMAL HIGH (ref 8–23)
CO2: 30 mmol/L (ref 22–32)
Calcium: 8.6 mg/dL — ABNORMAL LOW (ref 8.9–10.3)
Chloride: 94 mmol/L — ABNORMAL LOW (ref 98–111)
Creatinine, Ser: 1.29 mg/dL — ABNORMAL HIGH (ref 0.44–1.00)
GFR, Estimated: 45 mL/min — ABNORMAL LOW
Glucose, Bld: 81 mg/dL (ref 70–99)
Potassium: 3.6 mmol/L (ref 3.5–5.1)
Sodium: 136 mmol/L (ref 135–145)

## 2025-01-12 LAB — PROCALCITONIN: Procalcitonin: 0.26 ng/mL

## 2025-01-12 LAB — PREPARE RBC (CROSSMATCH)

## 2025-01-12 MED ORDER — SODIUM CHLORIDE 0.9% IV SOLUTION: Freq: Once | INTRAVENOUS | Status: DC

## 2025-01-12 MED ORDER — ORAL CARE MOUTH RINSE: 15.0000 mL | OROMUCOSAL | Status: DC | PRN

## 2025-01-12 NOTE — Progress Notes (Addendum)
 "    Progress Note    [name]  FMW:[id number] DOB: 23-Jun-1956  DOA: 01/11/2025 PCP: [facility]      Brief Narrative:    Medical records reviewed and are as summarized below:  [name] is a 69 y.o. female with medical history significant of chronic hypoxic hypercapnic respiratory failure on 6 L and BiPAP at home, HTN, HLD, CHF, paroxysmal A-fib presented to the ED on 01/11/2025 from [facility] due to progressively worsening shortness of breath and general weakness for about 2 weeks duration.  She also reported a fall from her wheelchair about 2 weeks prior to admission.  Since the fall, she has had pain in the left side of the hip and groin.      Assessment/Plan:   Principal Problem:   Acute on chronic heart failure (HCC)    Body mass index is 37.74 kg/m.  (Class II obesity)    Acute on chronic HFpEF: Continue IV Lasix .  Monitor BMP, daily weight and urine output. Echo in June 2025 showed EF estimated 55 to 60%, grade 2 diastolic dysfunction, moderate MR.   Acute on chronic hypoxic respiratory failure: She was requiring up to 11 L oxygen via HFNC.  Oxygen has been weaned down to 9 L/min.  She uses 6 L oxygen at baseline.  Continue BiPAP at night.   Elevated troponin: Troponin 66, 62.  This is likely due to demand ischemia.   Left hip and thigh pain, left thigh subcutaneous hematoma: CT left hip did not show any fracture or dislocation.  Venous duplex of the lower extremities showed complex fluid collection in the left thigh suspicious for subcutaneous hematoma.   Acute on chronic anemia: Globin down from 8.8-7.7.  Transfuse 1 units of PRBCs because of underlying CHF and hypoxic respiratory failure Monitor CBC.   Mild leukocytosis: Improved.  Procalcitonin negative.  No evidence of infection thus far.   COPD: Stable.  Continue bronchodilators   Paroxysmal atrial fibrillation: Continue Cardizem  and Xarelto .   Comorbidities  include rheumatoid arthritis on low-dose prednisone  and Plaquenil , hypothyroidism, history of PE on Xarelto , OSA on CPAP at night, GERD, hyperlipidemia, CKD stage IIIa versus CKD stage IIIb (fluctuating GFR so difficult to determine exact stage)   Diet Order             Diet 2 gram sodium Room service appropriate? Yes; Fluid consistency: Thin; Fluid restriction: 1500 mL Fluid  Diet effective now                                  Consultants: None  Procedures: None    Medications:    atorvastatin   10 mg Oral QHS   budesonide   0.5 mg Nebulization BID   budesonide -glycopyrrolate -formoterol   2 puff Inhalation BID   diltiazem   120 mg Oral Daily   furosemide   40 mg Intravenous BID   gabapentin   400 mg Oral TID   hydroxychloroquine   200 mg Oral Daily   levothyroxine   175 mcg Oral Daily   loratadine   10 mg Oral Daily   melatonin  5 mg Oral QHS   montelukast   10 mg Oral Daily   pantoprazole   40 mg Oral Daily   predniSONE   5 mg Oral Q breakfast   rivaroxaban   20 mg Oral Daily   sertraline   75 mg Oral Daily   Continuous Infusions:   Anti-infectives (From admission, onward)    Start  Dose/Rate Route Frequency Ordered Stop   01/12/25 1000  hydroxychloroquine  (PLAQUENIL ) tablet 200 mg        200 mg Oral Daily 01/11/25 1712                Family Communication/Anticipated D/C date and plan/Code Status   DVT prophylaxis:  rivaroxaban  (XARELTO ) tablet 20 mg     Code Status: Full Code  Family Communication: None Disposition Plan: Plan to discharge to the nursing home   Status is: Inpatient Remains inpatient appropriate because: CHF exacerbation and hypoxic respiratory failure       Subjective:   Interval events noted.  No shortness of breath or chest pain.  She feels little better today.  Dorise, RN at bedside  Objective:    Vitals:   01/12/25 0559 01/12/25 0635 01/12/25 0700 01/12/25 0825  BP:    125/71  Pulse:    81  Resp:       Temp:    98.6 F (37 C)  TempSrc:    Axillary  SpO2: 99% 99% 94% 98%  Weight:      Height:       No data found.   Intake/Output Summary (Last 24 hours) at 01/12/2025 0927 Last data filed at 01/12/2025 0827 Gross per 24 hour  Intake 100 ml  Output 600 ml  Net -500 ml   Filed Weights   01/11/25 1820 01/12/25 0045  Weight: 98.1 kg 93.6 kg    Exam:  GEN: NAD SKIN: Warm and dry EYES: Pale, anicteric ENT: MMM CV: RRR PULM: Bibasilar rales ABD: soft, ND, NT, +BS CNS: AAO x 3, non focal EXT: Tender induration? on the anterior aspect of left proximal thigh. No edema      Data Reviewed:   I have personally reviewed following labs and imaging studies:  Labs: Labs show the following:   Basic Metabolic Panel: Recent Labs  Lab 01/11/25 1330 01/12/25 0513  NA 133* 136  K 4.2 3.6  CL 91* 94*  CO2 27 30  GLUCOSE 85 81  BUN 33* 32*  CREATININE 1.35* 1.29*  CALCIUM  9.0 8.6*  MG 2.3  --    GFR Estimated Creatinine Clearance: 45.1 mL/min (A) (by C-G formula based on SCr of 1.29 mg/dL (H)). Liver Function Tests: Recent Labs  Lab 01/11/25 1330  AST 23  ALT 11  ALKPHOS 99  BILITOT 1.4*  PROT 7.3  ALBUMIN  3.3*   Recent Labs  Lab 01/11/25 1330  LIPASE 15   No results for input(s): AMMONIA in the last 168 hours. Coagulation profile No results for input(s): INR, PROTIME in the last 168 hours.  CBC: Recent Labs  Lab 01/11/25 1330 01/12/25 0513  WBC 10.6* 7.1  NEUTROABS 8.4*  --   HGB 8.8* 7.7*  HCT 29.9* 25.9*  MCV 93.4 91.5  PLT 270 223   Cardiac Enzymes: No results for input(s): CKTOTAL, CKMB, CKMBINDEX, TROPONINI in the last 168 hours. BNP (last 3 results) Recent Labs    11/18/24 1330 01/11/25 1330  PROBNP 284.0 3,962.0*   CBG: No results for input(s): GLUCAP in the last 168 hours. D-Dimer: No results for input(s): DDIMER in the last 72 hours. Hgb A1c: No results for input(s): HGBA1C in the last 72 hours. Lipid  Profile: No results for input(s): CHOL, HDL, LDLCALC, TRIG, CHOLHDL, LDLDIRECT in the last 72 hours. Thyroid  function studies: No results for input(s): TSH, T4TOTAL, T3FREE, THYROIDAB in the last 72 hours.  Invalid input(s): FREET3 Anemia work up: No results for  input(s): VITAMINB12, FOLATE, FERRITIN, TIBC, IRON, RETICCTPCT in the last 72 hours. Sepsis Labs: Recent Labs  Lab 01/11/25 1330 01/12/25 0134 01/12/25 0513  PROCALCITON  --  0.26  --   WBC 10.6*  --  7.1    Microbiology Recent Results (from the past 240 hours)  Resp panel by RT-PCR (RSV, Flu A&B, Covid) Anterior Nasal Swab     Status: None   Collection Time: 01/11/25  1:24 PM   Specimen: Anterior Nasal Swab  Result Value Ref Range Status   SARS Coronavirus 2 by RT PCR NEGATIVE NEGATIVE Final    Comment: (NOTE) SARS-CoV-2 target nucleic acids are NOT DETECTED.  The SARS-CoV-2 RNA is generally detectable in upper respiratory specimens during the acute phase of infection. The lowest concentration of SARS-CoV-2 viral copies this assay can detect is 138 copies/mL. A negative result does not preclude SARS-Cov-2 infection and should not be used as the sole basis for treatment or other patient management decisions. A negative result may occur with  improper specimen collection/handling, submission of specimen other than nasopharyngeal swab, presence of viral mutation(s) within the areas targeted by this assay, and inadequate number of viral copies(<138 copies/mL). A negative result must be combined with clinical observations, patient history, and epidemiological information. The expected result is Negative.  Fact Sheet for Patients:  bloggercourse.com  Fact Sheet for Healthcare Providers:  seriousbroker.it  This test is no t yet approved or cleared by the [place]  FDA and  has been authorized for detection and/or diagnosis of  SARS-CoV-2 by FDA under an Emergency Use Authorization (EUA). This EUA will remain  in effect (meaning this test can be used) for the duration of the COVID-19 declaration under Section 564(b)(1) of the Act, 21 U.S.C.section 360bbb-3(b)(1), unless the authorization is terminated  or revoked sooner.       Influenza A by PCR NEGATIVE NEGATIVE Final   Influenza B by PCR NEGATIVE NEGATIVE Final    Comment: (NOTE) The Xpert Xpress SARS-CoV-2/FLU/RSV plus assay is intended as an aid in the diagnosis of influenza from Nasopharyngeal swab specimens and should not be used as a sole basis for treatment. Nasal washings and aspirates are unacceptable for Xpert Xpress SARS-CoV-2/FLU/RSV testing.  Fact Sheet for Patients: bloggercourse.com  Fact Sheet for Healthcare Providers: seriousbroker.it  This test is not yet approved or cleared by the [place]  FDA and has been authorized for detection and/or diagnosis of SARS-CoV-2 by FDA under an Emergency Use Authorization (EUA). This EUA will remain in effect (meaning this test can be used) for the duration of the COVID-19 declaration under Section 564(b)(1) of the Act, 21 U.S.C. section 360bbb-3(b)(1), unless the authorization is terminated or revoked.     Resp Syncytial Virus by PCR NEGATIVE NEGATIVE Final    Comment: (NOTE) Fact Sheet for Patients: bloggercourse.com  Fact Sheet for Healthcare Providers: seriousbroker.it  This test is not yet approved or cleared by the [place]  FDA and has been authorized for detection and/or diagnosis of SARS-CoV-2 by FDA under an Emergency Use Authorization (EUA). This EUA will remain in effect (meaning this test can be used) for the duration of the COVID-19 declaration under Section 564(b)(1) of the Act, 21 U.S.C. section 360bbb-3(b)(1), unless the authorization is terminated  or revoked.  Performed at [facility], [address] [place], [place] [address]     Procedures and diagnostic studies:  CT HIP LEFT WO CONTRAST Result Date: 01/11/2025 EXAM: CT OF THE LEFT HIP WITHOUT IV CONTRAST 01/11/2025 06:55:19 PM TECHNIQUE:  CT of the left hip was performed without the administration of intravenous contrast. Multiplanar reformatted images are provided for review. Automated exposure control, iterative reconstruction, and/or weight based adjustment of the mA/kV was utilized to reduce the radiation dose to as low as reasonably achievable. COMPARISON: None available. CLINICAL HISTORY: Recent fall with left hip pain and negative x-ray, initial encounter. FINDINGS: BONES: No findings to suggest acute fracture or dislocation are noted in the left hip. No aggressive appearing osseous abnormality or periostitis. SOFT TISSUE: Surrounding muscular structures are unremarkable. No significant soft tissue edema or fluid collections. No soft tissue mass. JOINT: No joint effusion is seen. No significant degenerative changes. No osseous erosions. INTRAPELVIC CONTENTS: Visualized pelvic structures show no acute abnormality. IMPRESSION: 1. No acute fracture or dislocation in the left hip. Electronically signed by: Oneil Devonshire MD 01/11/2025 07:29 PM EST RP Workstation: [id number]   US  Venous Img Lower Unilateral Left Result Date: 01/11/2025 CLINICAL DATA:  Left lower extremity pain and bruising after falling 2 weeks previously. EXAM: LEFT LOWER EXTREMITY VENOUS DOPPLER ULTRASOUND TECHNIQUE: Gray-scale sonography with compression, as well as color and duplex ultrasound, were performed to evaluate the deep venous system(s) from the level of the common femoral vein through the popliteal and proximal calf veins. COMPARISON:  None Available. FINDINGS: VENOUS Normal compressibility of the common femoral, superficial femoral, and popliteal veins, as well as the visualized calf veins. Visualized  portions of profunda femoral vein and great saphenous vein unremarkable. No filling defects to suggest DVT on grayscale or color Doppler imaging. Doppler waveforms show normal direction of venous flow, normal respiratory plasticity and response to augmentation. Limited views of the contralateral common femoral vein are unremarkable. OTHER None. Limitations: In the lateral aspect of the left thigh there is a complex heterogeneous fluid collection measuring approximately 7.1 x 1.9 x 4.8 cm. No evidence of internal vascularity on color Doppler. IMPRESSION: 1. No evidence of DVT in the left lower extremity. 2. Complex fluid collection in the lateral aspect of the left thigh at the site of clinical symptoms likely represents a subcutaneous hematoma. In the appropriate clinical setting, an abscess could appear similar. Given the clinical history of a recent fall, hematoma is strongly favored. Electronically Signed   By: Wilkie Lent M.D.   On: 01/11/2025 16:18   DG Hip Unilat W or Wo Pelvis 2-3 Views Left Result Date: 01/11/2025 EXAM: 2 or 3 VIEW(S) XRAY OF THE PELVIS AND LEFT HIP 01/11/2025 01:51:00 PM COMPARISON: None available. CLINICAL HISTORY: Shortness of breath. FINDINGS: BONES AND JOINTS: SI joints are symmetric. No acute fracture, pelvic bone diastasis, or dislocation. Mild degenerative changes of the left hip joint. The right hip demonstrates normal alignment. Diffuse osteopenia. SOFT TISSUES: Bowel anastomotic sutures overlying the pelvis. Aortoiliac atherosclerosis. IMPRESSION: 1. Diffuse osteopenia. No acute fracture, pelvic bone diastasis, or dislocation. Electronically signed by: Rogelia Myers MD 01/11/2025 02:58 PM EST RP Workstation: [id number]   DG Chest Portable 1 View Result Date: 01/11/2025 EXAM: 1 VIEW(S) XRAY OF THE CHEST 01/11/2025 01:51:00 PM COMPARISON: 11/18/2024 CLINICAL HISTORY: Shortness of breath. FINDINGS: LUNGS AND PLEURA: Elevated right hemidiaphragm. Hazy opacity at right  lung base, possibly representing pleural effusion and airspace disease. Calcified granulomas in right lung. No pneumothorax. HEART AND MEDIASTINUM: Cardiomegaly. Tortuous aorta with aortic atherosclerosis. BONES AND SOFT TISSUES: No acute osseous abnormality. IMPRESSION: 1. Hazy opacity at right lung base, which may be artifactual due to elevation of the right hemidiaphragm versus pleural effusion and airspace disease. 2. Cardiomegaly. Electronically signed by:  Rogelia Myers MD 01/11/2025 02:56 PM EST RP Workstation: [id number]               LOS: 1 day   Raynesha Tiedt  [employer] Loss Adjuster on [email or web address]. If 7PM-7AM, please contact night-coverage at [email or web address]     01/12/2025, 9:27 AM           "

## 2025-01-12 NOTE — Progress Notes (Signed)
 Patient rolled self and sat up in bed without any assistance.

## 2025-01-12 NOTE — Progress Notes (Signed)
 Heart Failure Navigator Progress Note  Advanced Heart Failure Team patient of Ellouise Class, FNP. Hospital Follow-Up scheduled for 01/21/2025 @ 10:30 AM. Added to patient AVS.  Navigator will sign off at this time.  Charmaine Pines, RN, BSN [facility] Heart Failure Navigator Secure Chat Only

## 2025-01-12 NOTE — ED Notes (Signed)
 RN presented to bedside pt with noninvasive mask on and dentures. Pt found in supine position with SpO2 in the low 80's. Pt awakened, top dentures removed, and noninvasive mask repositioned. Pt SpO2 increased to the 90's.

## 2025-01-12 NOTE — Plan of Care (Signed)

## 2025-01-12 NOTE — Progress Notes (Addendum)
 Right thigh wound assessed. Present on admission. MD aware. First documented on previous hospitalization 11/19/24. Skin care order placed.   Minimal drainage. Wound bed pink. No pain. No signs of infection. Reinforced dressing.

## 2025-01-12 NOTE — Plan of Care (Signed)
  Problem: Education: Goal: Knowledge of General Education information will improve Description: Including pain rating scale, medication(s)/side effects and non-pharmacologic comfort measures Outcome: Progressing   Problem: Activity: Goal: Risk for activity intolerance will decrease Outcome: Progressing   Problem: Elimination: Goal: Will not experience complications related to urinary retention Outcome: Progressing   Problem: Pain Managment: Goal: General experience of comfort will improve and/or be controlled Outcome: Progressing   Problem: Safety: Goal: Ability to remain free from injury will improve Outcome: Progressing   Problem: Skin Integrity: Goal: Risk for impaired skin integrity will decrease Outcome: Progressing

## 2025-01-13 DIAGNOSIS — I5033 Acute on chronic diastolic (congestive) heart failure: Secondary | ICD-10-CM | POA: Diagnosis not present

## 2025-01-13 LAB — RENAL FUNCTION PANEL
Albumin: 3.2 g/dL — ABNORMAL LOW (ref 3.5–5.0)
Anion gap: 10 (ref 5–15)
BUN: 26 mg/dL — ABNORMAL HIGH (ref 8–23)
CO2: 33 mmol/L — ABNORMAL HIGH (ref 22–32)
Calcium: 8.8 mg/dL — ABNORMAL LOW (ref 8.9–10.3)
Chloride: 98 mmol/L (ref 98–111)
Creatinine, Ser: 1.18 mg/dL — ABNORMAL HIGH (ref 0.44–1.00)
GFR, Estimated: 50 mL/min — ABNORMAL LOW
Glucose, Bld: 79 mg/dL (ref 70–99)
Phosphorus: 3.2 mg/dL (ref 2.5–4.6)
Potassium: 3.1 mmol/L — ABNORMAL LOW (ref 3.5–5.1)
Sodium: 140 mmol/L (ref 135–145)

## 2025-01-13 LAB — CBC
HCT: 31.5 % — ABNORMAL LOW (ref 36.0–46.0)
Hemoglobin: 9.4 g/dL — ABNORMAL LOW (ref 12.0–15.0)
MCH: 27.6 pg (ref 26.0–34.0)
MCHC: 29.8 g/dL — ABNORMAL LOW (ref 30.0–36.0)
MCV: 92.6 fL (ref 80.0–100.0)
Platelets: 256 10*3/uL (ref 150–400)
RBC: 3.4 MIL/uL — ABNORMAL LOW (ref 3.87–5.11)
RDW: 15.7 % — ABNORMAL HIGH (ref 11.5–15.5)
WBC: 7.9 10*3/uL (ref 4.0–10.5)
nRBC: 0 % (ref 0.0–0.2)

## 2025-01-13 LAB — BPAM RBC
Blood Product Expiration Date: 202602192359
ISSUE DATE / TIME: 202601281353
Unit Type and Rh: 5100

## 2025-01-13 LAB — TYPE AND SCREEN
ABO/RH(D): O POS
Antibody Screen: NEGATIVE
Unit division: 0

## 2025-01-13 LAB — MAGNESIUM: Magnesium: 2.1 mg/dL (ref 1.7–2.4)

## 2025-01-13 MED ORDER — OXYCODONE HCL 5 MG PO TABS
5.0000 mg | ORAL_TABLET | Freq: Once | ORAL | Status: AC
  Administered 2025-01-13: 5 mg via ORAL
  Filled 2025-01-13: qty 1

## 2025-01-13 MED ORDER — LIDOCAINE 5 % EX PTCH
1.0000 | MEDICATED_PATCH | CUTANEOUS | Status: DC
  Administered 2025-01-13 – 2025-01-16 (×4): 1 via TRANSDERMAL
  Filled 2025-01-13 (×5): qty 1

## 2025-01-13 MED ORDER — POTASSIUM CHLORIDE CRYS ER 20 MEQ PO TBCR
40.0000 meq | EXTENDED_RELEASE_TABLET | Freq: Two times a day (BID) | ORAL | Status: AC
  Administered 2025-01-13 (×2): 40 meq via ORAL
  Filled 2025-01-13 (×2): qty 2

## 2025-01-13 MED ORDER — TRAMADOL HCL 50 MG PO TABS
50.0000 mg | ORAL_TABLET | Freq: Three times a day (TID) | ORAL | Status: AC | PRN
  Administered 2025-01-13 – 2025-01-14 (×2): 50 mg via ORAL
  Filled 2025-01-13 (×2): qty 1

## 2025-01-13 NOTE — Progress Notes (Signed)
 Wound dressing changed to right thigh. No signs of infection. Patient tolerated well.

## 2025-01-13 NOTE — TOC Initial Note (Signed)
 Transition of Care [facility]) - Initial/Assessment Note    Patient Details  Name: [name] MRN: [id number] Date of Birth: 07/13/1956  [facility] CM/SW Contact:    Shasta DELENA Daring, RN Phone Number: 01/13/2025, 5:11 PM  Clinical Narrative:                 RNCM spoke with patient's contact and daughter in law [place]. Confirmed plan is for [facility] to return to [facility] at discharge.  Delon reqeusted a call from the MD.  Sent secure chat to Dr. Jens.  Will follow for needs development.  Expected Discharge Plan: Skilled Nursing Facility Barriers to Discharge: Continued Medical Work up   Patient Goals and CMS Choice            Expected Discharge Plan and Services                                              Prior Living Arrangements/Services     Patient language and need for interpreter reviewed:: Yes        Need for Family Participation in Patient Care: Yes (Comment) Care giver support system in place?: Yes (comment)   Criminal Activity/Legal Involvement Pertinent to Current Situation/Hospitalization: No - Comment as needed  Activities of Daily Living   ADL Screening (condition at time of admission) Independently performs ADLs?: No Does the patient have a NEW difficulty with bathing/dressing/toileting/self-feeding that is expected to last >3 days?: No Does the patient have a NEW difficulty with getting in/out of bed, walking, or climbing stairs that is expected to last >3 days?: No Does the patient have a NEW difficulty with communication that is expected to last >3 days?: No Is the patient deaf or have difficulty hearing?: No Does the patient have difficulty seeing, even when wearing glasses/contacts?: No Does the patient have difficulty concentrating, remembering, or making decisions?: No  Permission Sought/Granted Permission sought to share information with : Facility Medical Sales Representative, Case Estate Manager/land Agent granted to share  information with : Yes, Verbal Permission Granted     Permission granted to share info w AGENCY: [facility]        Emotional Assessment              Admission diagnosis:  Shortness of breath [R06.02] Acute on chronic heart failure (HCC) [I50.9] Pain of left hip [M25.552] Congestive heart failure, unspecified HF chronicity, unspecified heart failure type (HCC) [I50.9] Patient Active Problem List   Diagnosis Date Noted   Acute on chronic heart failure (HCC) 01/11/2025   Obesity (BMI 30-39.9) 09/14/2024   Leg pain, diffuse, right 09/14/2024   Hyponatremia 09/12/2024   Acute metabolic encephalopathy 09/12/2024   Traumatic hematoma of left lower leg 06/30/2024   Syncope 06/24/2024   Anemia of chronic disease 06/23/2024   Acute hypoxic on chronic hypercapnic respiratory failure (HCC) 06/14/2024   ABLA (acute blood loss anemia) 06/14/2024   Open wound of left lower leg 06/14/2024   Fall 06/14/2024   Hypercapnic respiratory failure (HCC) 05/31/2024   Acute hypoxic respiratory failure (HCC) 05/30/2024   History of pulmonary embolism 05/21/2024   Obesity, Class III, BMI 40-49.9 (morbid obesity) (HCC) 05/21/2024   Paroxysmal atrial fibrillation (HCC) 05/21/2024   Chronic anticoagulation 05/21/2024   Acute on chronic respiratory failure with hypoxia (HCC) 05/21/2024   Epistaxis 05/21/2024   History of DVT (deep vein thrombosis) 05/21/2024  CHF exacerbation (HCC) 05/21/2024   CHF (congestive heart failure) (HCC) 05/20/2024   Type 2 diabetes mellitus without complication, without long-term current use of insulin  (HCC) 05/28/2023   COVID-19 01/05/2021   Hypoxia    Chronic obstructive pulmonary disease (HCC)    Mitral valve annular calcification 04/07/2018   Depression 04/07/2018   Diastolic heart failure (HCC) 02/10/2018   Family history of hemophilia B 12/02/2017   Healthcare maintenance 05/07/2017   Onychomycosis 05/07/2017   Tricompartment osteoarthritis of both knees  02/19/2017   Acute kidney injury superimposed on CKD 04/23/2015   Rheumatoid arthritis (HCC) 07/08/2008   Morbid obesity (HCC) 05/04/2008   Essential hypertension 05/04/2008   ALLERGIC RHINITIS 05/04/2008   GERD 05/04/2008   VARICOSE VEINS LOWER EXTREMITIES W/INFLAMMATION 11/02/2007   Hypothyroidism 08/18/2007   PCP:  [facility]:   [facility] [address] [place], [place] - [address] [place] [address] Phone: [phone] Fax: [phone]  [facility] - [facility] [address] [place] [address] Phone: [phone] Fax: [phone]  [facility] - [place], [place] - [address] [place] [address] Phone: [phone] Fax: [phone]     Social Drivers of Health (SDOH) Social History: SDOH Screenings   Food Insecurity: No Food Insecurity (01/12/2025)  Housing: Low Risk (01/12/2025)  Transportation Needs: No Transportation Needs (01/12/2025)  Utilities: Not At Risk (01/12/2025)  Financial Resource Strain: High Risk (05/24/2024)  Social Connections: Socially Isolated (01/12/2025)  Tobacco Use: Medium Risk (01/12/2025)   SDOH Interventions:     Readmission Risk Interventions    01/13/2025    5:03 PM  Readmission Risk Prevention Plan  Transportation Screening Complete  Medication Review (RN Care Manager) Complete  PCP or Specialist appointment within 3-5 days of discharge --  HRI or Home Care Consult Complete  Palliative Care Screening Not Applicable  Skilled Nursing Facility Complete

## 2025-01-13 NOTE — Plan of Care (Signed)

## 2025-01-13 NOTE — Progress Notes (Addendum)
 "    Progress Note    [name]  FMW:[id number] DOB: [birth year]  DOA: 01/11/2025 PCP: [facility]      Brief Narrative:    Medical records reviewed and are as summarized below:  [name] is a 69 y.o. female with medical history significant of chronic hypoxic hypercapnic respiratory failure on 6 L and BiPAP at home, HTN, HLD, CHF, paroxysmal A-fib presented to the ED on 01/11/2025 from [facility] due to progressively worsening shortness of breath and general weakness for about 2 weeks duration.  She also reported a fall from her wheelchair about 2 weeks prior to admission.  Since the fall, she has had pain in the left side of the hip and groin.      Assessment/Plan:   Principal Problem:   Acute on chronic heart failure (HCC)    Body mass index is 37.14 kg/m.  (Class II obesity)    Acute on chronic HFpEF: Continue IV Lasix .  Monitor urine output, daily weight and BMP.   Echo in June 2025 showed EF estimated 55 to 60%, grade 2 diastolic dysfunction, moderate MR.   Acute on chronic hypoxic respiratory failure: Oxygen weaned down from 11 L to 8 L/min.  She uses 6 L oxygen at baseline.  Continue BiPAP at night.    Elevated troponin: Troponin 66, 62.  This is likely due to demand ischemia.   Left hip and thigh pain, left thigh subcutaneous hematoma: CT left hip did not show any fracture or dislocation.  Venous duplex of the lower extremities showed complex fluid collection in the left thigh suspicious for subcutaneous hematoma.   Acute on chronic anemia: Hemoglobin up from 7.7-9.4. S/p transfusion with 1 unit of PRBCs on 01/12/2025.     Mild leukocytosis: Improved.  Procalcitonin negative.  No evidence of infection thus far.   COPD: Stable.  Continue bronchodilators   Paroxysmal atrial fibrillation: Continue Cardizem  and Xarelto .   Comorbidities include rheumatoid arthritis on low-dose prednisone  and Plaquenil , hypothyroidism, history  of PE on Xarelto , OSA on CPAP at night, GERD, hyperlipidemia, CKD stage IIIa versus CKD stage IIIb (fluctuating GFR so difficult to determine exact stage)   Diet Order             Diet 2 gram sodium Room service appropriate? Yes; Fluid consistency: Thin; Fluid restriction: 1500 mL Fluid  Diet effective now                                  Consultants: None  Procedures: None    Medications:    sodium chloride    Intravenous Once   atorvastatin   10 mg Oral QHS   budesonide   0.5 mg Nebulization BID   budesonide -glycopyrrolate -formoterol   2 puff Inhalation BID   diltiazem   120 mg Oral Daily   furosemide   40 mg Intravenous BID   gabapentin   400 mg Oral TID   hydroxychloroquine   200 mg Oral Daily   levothyroxine   175 mcg Oral Daily   loratadine   10 mg Oral Daily   melatonin  5 mg Oral QHS   montelukast   10 mg Oral Daily   pantoprazole   40 mg Oral Daily   potassium chloride   40 mEq Oral BID   predniSONE   5 mg Oral Q breakfast   rivaroxaban   20 mg Oral Daily   sertraline   75 mg Oral Daily   Continuous Infusions:   Anti-infectives (From admission, onward)  Start     Dose/Rate Route Frequency Ordered Stop   01/12/25 1000  hydroxychloroquine  (PLAQUENIL ) tablet 200 mg        200 mg Oral Daily 01/11/25 1712                Family Communication/Anticipated D/C date and plan/Code Status   DVT prophylaxis:  rivaroxaban  (XARELTO ) tablet 20 mg     Code Status: Full Code  Family Communication: Plan discussed with Delon, daughter-in-law, over the phone Disposition Plan: Plan to discharge to the nursing home   Status is: Inpatient Remains inpatient appropriate because: CHF exacerbation and hypoxic respiratory failure       Subjective:   Interval events noted.  No complaints.  She feels better.  She said she used CPAP last night  Objective:    Vitals:   01/13/25 0500 01/13/25 0915 01/13/25 0935 01/13/25 1100  BP:  106/60  (!)  99/57  Pulse:  86  88  Resp:  17  20  Temp:  98.2 F (36.8 C)  98.3 F (36.8 C)  TempSrc:    Oral  SpO2:  98% 99% 95%  Weight: 92.1 kg     Height:       No data found.   Intake/Output Summary (Last 24 hours) at 01/13/2025 1414 Last data filed at 01/13/2025 0148 Gross per 24 hour  Intake 929.75 ml  Output 1750 ml  Net -820.25 ml   Filed Weights   01/12/25 0045 01/13/25 0306 01/13/25 0500  Weight: 93.6 kg 92.2 kg 92.1 kg    Exam:  GEN: NAD SKIN: Warm and dry EYES: No pallor or icterus ENT: MMM CV: RRR PULM: Bibasilar rales.  No wheezing or rhonchi ABD: soft, obese, NT, +BS CNS: AAO x 3, non focal EXT: Tender induration on the anterior aspect of the left proximal thigh.  No lower extremity edema or erythema     Data Reviewed:   I have personally reviewed following labs and imaging studies:  Labs: Labs show the following:   Basic Metabolic Panel: Recent Labs  Lab 01/11/25 1330 01/12/25 0513 01/13/25 0445  NA 133* 136 140  K 4.2 3.6 3.1*  CL 91* 94* 98  CO2 27 30 33*  GLUCOSE 85 81 79  BUN 33* 32* 26*  CREATININE 1.35* 1.29* 1.18*  CALCIUM  9.0 8.6* 8.8*  MG 2.3  --  2.1  PHOS  --   --  3.2   GFR Estimated Creatinine Clearance: 48.9 mL/min (A) (by C-G formula based on SCr of 1.18 mg/dL (H)). Liver Function Tests: Recent Labs  Lab 01/11/25 1330 01/13/25 0445  AST 23  --   ALT 11  --   ALKPHOS 99  --   BILITOT 1.4*  --   PROT 7.3  --   ALBUMIN  3.3* 3.2*   Recent Labs  Lab 01/11/25 1330  LIPASE 15   No results for input(s): AMMONIA in the last 168 hours. Coagulation profile No results for input(s): INR, PROTIME in the last 168 hours.  CBC: Recent Labs  Lab 01/11/25 1330 01/12/25 0513 01/13/25 0445  WBC 10.6* 7.1 7.9  NEUTROABS 8.4*  --   --   HGB 8.8* 7.7* 9.4*  HCT 29.9* 25.9* 31.5*  MCV 93.4 91.5 92.6  PLT 270 223 256   Cardiac Enzymes: No results for input(s): CKTOTAL, CKMB, CKMBINDEX, TROPONINI in the last  168 hours. BNP (last 3 results) Recent Labs    11/18/24 1330 01/11/25 1330  PROBNP 284.0 3,962.0*  CBG: No results for input(s): GLUCAP in the last 168 hours. D-Dimer: No results for input(s): DDIMER in the last 72 hours. Hgb A1c: No results for input(s): HGBA1C in the last 72 hours. Lipid Profile: No results for input(s): CHOL, HDL, LDLCALC, TRIG, CHOLHDL, LDLDIRECT in the last 72 hours. Thyroid  function studies: No results for input(s): TSH, T4TOTAL, T3FREE, THYROIDAB in the last 72 hours.  Invalid input(s): FREET3 Anemia work up: No results for input(s): VITAMINB12, FOLATE, FERRITIN, TIBC, IRON, RETICCTPCT in the last 72 hours. Sepsis Labs: Recent Labs  Lab 01/11/25 1330 01/12/25 0134 01/12/25 0513 01/13/25 0445  PROCALCITON  --  0.26  --   --   WBC 10.6*  --  7.1 7.9    Microbiology Recent Results (from the past 240 hours)  Resp panel by RT-PCR (RSV, Flu A&B, Covid) Anterior Nasal Swab     Status: None   Collection Time: 01/11/25  1:24 PM   Specimen: Anterior Nasal Swab  Result Value Ref Range Status   SARS Coronavirus 2 by RT PCR NEGATIVE NEGATIVE Final    Comment: (NOTE) SARS-CoV-2 target nucleic acids are NOT DETECTED.  The SARS-CoV-2 RNA is generally detectable in upper respiratory specimens during the acute phase of infection. The lowest concentration of SARS-CoV-2 viral copies this assay can detect is 138 copies/mL. A negative result does not preclude SARS-Cov-2 infection and should not be used as the sole basis for treatment or other patient management decisions. A negative result may occur with  improper specimen collection/handling, submission of specimen other than nasopharyngeal swab, presence of viral mutation(s) within the areas targeted by this assay, and inadequate number of viral copies(<138 copies/mL). A negative result must be combined with clinical observations, patient history, and  epidemiological information. The expected result is Negative.  Fact Sheet for Patients:  bloggercourse.com  Fact Sheet for Healthcare Providers:  seriousbroker.it  This test is no t yet approved or cleared by the [place]  FDA and  has been authorized for detection and/or diagnosis of SARS-CoV-2 by FDA under an Emergency Use Authorization (EUA). This EUA will remain  in effect (meaning this test can be used) for the duration of the COVID-19 declaration under Section 564(b)(1) of the Act, 21 U.S.C.section 360bbb-3(b)(1), unless the authorization is terminated  or revoked sooner.       Influenza A by PCR NEGATIVE NEGATIVE Final   Influenza B by PCR NEGATIVE NEGATIVE Final    Comment: (NOTE) The Xpert Xpress SARS-CoV-2/FLU/RSV plus assay is intended as an aid in the diagnosis of influenza from Nasopharyngeal swab specimens and should not be used as a sole basis for treatment. Nasal washings and aspirates are unacceptable for Xpert Xpress SARS-CoV-2/FLU/RSV testing.  Fact Sheet for Patients: bloggercourse.com  Fact Sheet for Healthcare Providers: seriousbroker.it  This test is not yet approved or cleared by the [place]  FDA and has been authorized for detection and/or diagnosis of SARS-CoV-2 by FDA under an Emergency Use Authorization (EUA). This EUA will remain in effect (meaning this test can be used) for the duration of the COVID-19 declaration under Section 564(b)(1) of the Act, 21 U.S.C. section 360bbb-3(b)(1), unless the authorization is terminated or revoked.     Resp Syncytial Virus by PCR NEGATIVE NEGATIVE Final    Comment: (NOTE) Fact Sheet for Patients: bloggercourse.com  Fact Sheet for Healthcare Providers: seriousbroker.it  This test is not yet approved or cleared by the [place]  FDA and has been  authorized for detection and/or diagnosis of SARS-CoV-2 by FDA under an  Emergency Use Authorization (EUA). This EUA will remain in effect (meaning this test can be used) for the duration of the COVID-19 declaration under Section 564(b)(1) of the Act, 21 U.S.C. section 360bbb-3(b)(1), unless the authorization is terminated or revoked.  Performed at [facility], [address] [place], [place] [address]     Procedures and diagnostic studies:  CT HIP LEFT WO CONTRAST Result Date: 01/11/2025 EXAM: CT OF THE LEFT HIP WITHOUT IV CONTRAST 01/11/2025 06:55:19 PM TECHNIQUE: CT of the left hip was performed without the administration of intravenous contrast. Multiplanar reformatted images are provided for review. Automated exposure control, iterative reconstruction, and/or weight based adjustment of the mA/kV was utilized to reduce the radiation dose to as low as reasonably achievable. COMPARISON: None available. CLINICAL HISTORY: Recent fall with left hip pain and negative x-ray, initial encounter. FINDINGS: BONES: No findings to suggest acute fracture or dislocation are noted in the left hip. No aggressive appearing osseous abnormality or periostitis. SOFT TISSUE: Surrounding muscular structures are unremarkable. No significant soft tissue edema or fluid collections. No soft tissue mass. JOINT: No joint effusion is seen. No significant degenerative changes. No osseous erosions. INTRAPELVIC CONTENTS: Visualized pelvic structures show no acute abnormality. IMPRESSION: 1. No acute fracture or dislocation in the left hip. Electronically signed by: Oneil Devonshire MD 01/11/2025 07:29 PM EST RP Workstation: [id number]   US  Venous Img Lower Unilateral Left Result Date: 01/11/2025 CLINICAL DATA:  Left lower extremity pain and bruising after falling 2 weeks previously. EXAM: LEFT LOWER EXTREMITY VENOUS DOPPLER ULTRASOUND TECHNIQUE: Gray-scale sonography with compression, as well as color and duplex  ultrasound, were performed to evaluate the deep venous system(s) from the level of the common femoral vein through the popliteal and proximal calf veins. COMPARISON:  None Available. FINDINGS: VENOUS Normal compressibility of the common femoral, superficial femoral, and popliteal veins, as well as the visualized calf veins. Visualized portions of profunda femoral vein and great saphenous vein unremarkable. No filling defects to suggest DVT on grayscale or color Doppler imaging. Doppler waveforms show normal direction of venous flow, normal respiratory plasticity and response to augmentation. Limited views of the contralateral common femoral vein are unremarkable. OTHER None. Limitations: In the lateral aspect of the left thigh there is a complex heterogeneous fluid collection measuring approximately 7.1 x 1.9 x 4.8 cm. No evidence of internal vascularity on color Doppler. IMPRESSION: 1. No evidence of DVT in the left lower extremity. 2. Complex fluid collection in the lateral aspect of the left thigh at the site of clinical symptoms likely represents a subcutaneous hematoma. In the appropriate clinical setting, an abscess could appear similar. Given the clinical history of a recent fall, hematoma is strongly favored. Electronically Signed   By: Wilkie Lent M.D.   On: 01/11/2025 16:18               LOS: 2 days   Vallarie Fei  [facility]   Pager on [email or web address]. If 7PM-7AM, please contact night-coverage at [email or web address]     01/13/2025, 2:14 PM           "

## 2025-01-14 DIAGNOSIS — I5033 Acute on chronic diastolic (congestive) heart failure: Secondary | ICD-10-CM | POA: Diagnosis not present

## 2025-01-14 LAB — CBC
HCT: 31.2 % — ABNORMAL LOW (ref 36.0–46.0)
Hemoglobin: 9.3 g/dL — ABNORMAL LOW (ref 12.0–15.0)
MCH: 28.1 pg (ref 26.0–34.0)
MCHC: 29.8 g/dL — ABNORMAL LOW (ref 30.0–36.0)
MCV: 94.3 fL (ref 80.0–100.0)
Platelets: 263 10*3/uL (ref 150–400)
RBC: 3.31 MIL/uL — ABNORMAL LOW (ref 3.87–5.11)
RDW: 15.6 % — ABNORMAL HIGH (ref 11.5–15.5)
WBC: 9.7 10*3/uL (ref 4.0–10.5)
nRBC: 0 % (ref 0.0–0.2)

## 2025-01-14 LAB — RENAL FUNCTION PANEL
Albumin: 3.6 g/dL (ref 3.5–5.0)
Anion gap: 7 (ref 5–15)
BUN: 32 mg/dL — ABNORMAL HIGH (ref 8–23)
CO2: 34 mmol/L — ABNORMAL HIGH (ref 22–32)
Calcium: 9 mg/dL (ref 8.9–10.3)
Chloride: 97 mmol/L — ABNORMAL LOW (ref 98–111)
Creatinine, Ser: 1.3 mg/dL — ABNORMAL HIGH (ref 0.44–1.00)
GFR, Estimated: 45 mL/min — ABNORMAL LOW
Glucose, Bld: 101 mg/dL — ABNORMAL HIGH (ref 70–99)
Phosphorus: 3.1 mg/dL (ref 2.5–4.6)
Potassium: 4.9 mmol/L (ref 3.5–5.1)
Sodium: 137 mmol/L (ref 135–145)

## 2025-01-14 LAB — MAGNESIUM: Magnesium: 1.9 mg/dL (ref 1.7–2.4)

## 2025-01-14 MED ORDER — OXYCODONE HCL 5 MG PO TABS
5.0000 mg | ORAL_TABLET | Freq: Once | ORAL | Status: AC
  Administered 2025-01-14: 5 mg via ORAL
  Filled 2025-01-14: qty 1

## 2025-01-14 NOTE — NC FL2 (Signed)
 " [place]  MEDICAID FL2 LEVEL OF CARE FORM     IDENTIFICATION  Patient Name: [name] Birthdate: [birth year] Sex: female Admission Date (Current Location): 01/11/2025  [facility] and [place] Number:  Chiropodist and Address:  [facility], [address] [place], [place] [address]      Provider Number: [id number]  Attending Physician Name and Address:  Jens Durand, MD  Relative Name and Phone Number:       Current Level of Care: Hospital Recommended Level of Care: Skilled Nursing Facility Prior Approval Number:    Date Approved/Denied:   PASRR Number: [id number]  Discharge Plan: SNF    Current Diagnoses: Patient Active Problem List   Diagnosis Date Noted   Acute on chronic heart failure (HCC) 01/11/2025   Obesity (BMI 30-39.9) 09/14/2024   Leg pain, diffuse, right 09/14/2024   Hyponatremia 09/12/2024   Acute metabolic encephalopathy 09/12/2024   Traumatic hematoma of left lower leg 06/30/2024   Syncope 06/24/2024   Anemia of chronic disease 06/23/2024   Acute hypoxic on chronic hypercapnic respiratory failure (HCC) 06/14/2024   ABLA (acute blood loss anemia) 06/14/2024   Open wound of left lower leg 06/14/2024   Fall 06/14/2024   Hypercapnic respiratory failure (HCC) 05/31/2024   Acute hypoxic respiratory failure (HCC) 05/30/2024   History of pulmonary embolism 05/21/2024   Obesity, Class III, BMI 40-49.9 (morbid obesity) (HCC) 05/21/2024   Paroxysmal atrial fibrillation (HCC) 05/21/2024   Chronic anticoagulation 05/21/2024   Acute on chronic respiratory failure with hypoxia (HCC) 05/21/2024   Epistaxis 05/21/2024   History of DVT (deep vein thrombosis) 05/21/2024   CHF exacerbation (HCC) 05/21/2024   CHF (congestive heart failure) (HCC) 05/20/2024   Type 2 diabetes mellitus without complication, without long-term current use of insulin  (HCC) 05/28/2023   COVID-19 01/05/2021   Hypoxia    Chronic obstructive pulmonary  disease (HCC)    Mitral valve annular calcification 04/07/2018   Depression 04/07/2018   Diastolic heart failure (HCC) 02/10/2018   Family history of hemophilia B 12/02/2017   Healthcare maintenance 05/07/2017   Onychomycosis 05/07/2017   Tricompartment osteoarthritis of both knees 02/19/2017   Acute kidney injury superimposed on CKD 04/23/2015   Rheumatoid arthritis (HCC) 07/08/2008   Morbid obesity (HCC) 05/04/2008   Essential hypertension 05/04/2008   ALLERGIC RHINITIS 05/04/2008   GERD 05/04/2008   VARICOSE VEINS LOWER EXTREMITIES W/INFLAMMATION 11/02/2007   Hypothyroidism 08/18/2007    Orientation RESPIRATION BLADDER Height & Weight     Self, Time, Situation, Place  O2, Other (Comment) (HFNC 6 L. Weaning as tolerated. Bipap QHS.) Incontinent, External catheter Weight: 208 lb 1.8 oz (94.4 kg) Height:  5' 2 (157.5 cm)  BEHAVIORAL SYMPTOMS/MOOD NEUROLOGICAL BOWEL NUTRITION STATUS   (None)  (None) Incontinent Diet (2 gram sodium. Fluid restriction: 1500 mL.)  AMBULATORY STATUS COMMUNICATION OF NEEDS Skin     Verbally Skin abrasions, Bruising, Other (Comment), PU Stage and Appropriate Care (Erythema/redness.)   PU Stage 2 Dressing:  (Right thigh: Foam.)                   Personal Care Assistance Level of Assistance              Functional Limitations Info  Sight, Hearing, Speech Sight Info: Adequate Hearing Info: Adequate Speech Info: Adequate    SPECIAL CARE FACTORS FREQUENCY                       Contractures Contractures Info:  Not present    Additional Factors Info  Code Status, Allergies, Isolation Precautions Code Status Info: Full code Allergies Info: Latex, Oxycodone -acetaminophen , Penicillins, Strawberry Extract, Oxycodone -acetaminophen , Tyloxapol, Adhesive (Tape), Wound Dressing Adhesive     Isolation Precautions Info: Contact: ESBL     Current Medications (01/14/2025):  This is the current hospital active medication list Current  Facility-Administered Medications  Medication Dose Route Frequency Provider Last Rate Last Admin   0.9 %  sodium chloride  infusion (Manually program via Guardrails IV Fluids)   Intravenous Once Ayiku, Bernard, MD       acetaminophen  (TYLENOL ) tablet 650 mg  650 mg Oral Q6H PRN Jerelene, Shruthi, MD   650 mg at 01/13/25 1405   atorvastatin  (LIPITOR) tablet 10 mg  10 mg Oral QHS Ponnala, Shruthi, MD   10 mg at 01/13/25 2142   budesonide  (PULMICORT ) nebulizer solution 0.5 mg  0.5 mg Nebulization BID Ponnala, Shruthi, MD   0.5 mg at 01/14/25 9266   budesonide -glycopyrrolate -formoterol  (BREZTRI ) 160-9-4.8 MCG/ACT inhaler 2 puff  2 puff Inhalation BID Ponnala, Shruthi, MD       diltiazem  (CARDIZEM ) tablet 120 mg  120 mg Oral Daily Ponnala, Shruthi, MD   120 mg at 01/14/25 0900   furosemide  (LASIX ) injection 40 mg  40 mg Intravenous BID Ponnala, Shruthi, MD   40 mg at 01/14/25 0906   gabapentin  (NEURONTIN ) capsule 400 mg  400 mg Oral TID Ponnala, Shruthi, MD   400 mg at 01/14/25 0900   hydroxychloroquine  (PLAQUENIL ) tablet 200 mg  200 mg Oral Daily Ponnala, Shruthi, MD   200 mg at 01/14/25 9140   hydrOXYzine  (ATARAX ) tablet 25 mg  25 mg Oral Q8H PRN Ponnala, Shruthi, MD       ipratropium-albuterol  (DUONEB) 0.5-2.5 (3) MG/3ML nebulizer solution 3 mL  3 mL Nebulization Q6H PRN Ponnala, Shruthi, MD       levothyroxine  (SYNTHROID ) tablet 175 mcg  175 mcg Oral Daily Ponnala, Shruthi, MD   175 mcg at 01/14/25 9341   lidocaine  (LIDODERM ) 5 % 1 patch  1 patch Transdermal Q24H Donati-Garmon, Natalie M, NP   1 patch at 01/13/25 2142   loratadine  (CLARITIN ) tablet 10 mg  10 mg Oral Daily Ponnala, Shruthi, MD   10 mg at 01/14/25 0900   melatonin tablet 5 mg  5 mg Oral QHS Ponnala, Shruthi, MD   5 mg at 01/13/25 2142   montelukast  (SINGULAIR ) tablet 10 mg  10 mg Oral Daily Ponnala, Shruthi, MD   10 mg at 01/13/25 2142   Oral care mouth rinse  15 mL Mouth Rinse PRN Ponnala, Shruthi, MD       pantoprazole  (PROTONIX ) EC  tablet 40 mg  40 mg Oral Daily Ponnala, Shruthi, MD   40 mg at 01/14/25 0900   predniSONE  (DELTASONE ) tablet 5 mg  5 mg Oral Q breakfast Ponnala, Shruthi, MD   5 mg at 01/14/25 0902   rivaroxaban  (XARELTO ) tablet 20 mg  20 mg Oral Daily Ponnala, Shruthi, MD   20 mg at 01/14/25 0901   sertraline  (ZOLOFT ) tablet 75 mg  75 mg Oral Daily Ponnala, Shruthi, MD   75 mg at 01/14/25 0900     Discharge Medications: Please see discharge summary for a list of discharge medications.  Relevant Imaging Results:  Relevant Lab Results:   Additional Information SS#: [id number]  Lauraine JAYSON Carpen, LCSW     "

## 2025-01-14 NOTE — Plan of Care (Signed)
 Pt is alert oriented x 4. Weaned down to 6L/HFNC pt O2 dropped to 75% increased to 9L HFNC. Purewick in place, replaced. Dressings changed.   Problem: Education: Goal: Knowledge of General Education information will improve Description: Including pain rating scale, medication(s)/side effects and non-pharmacologic comfort measures Outcome: Progressing   Problem: Health Behavior/Discharge Planning: Goal: Ability to manage health-related needs will improve Outcome: Progressing   Problem: Clinical Measurements: Goal: Ability to maintain clinical measurements within normal limits will improve Outcome: Progressing Goal: Will remain free from infection Outcome: Progressing Goal: Diagnostic test results will improve Outcome: Progressing Goal: Respiratory complications will improve Outcome: Progressing Goal: Cardiovascular complication will be avoided Outcome: Progressing   Problem: Activity: Goal: Risk for activity intolerance will decrease Outcome: Progressing   Problem: Nutrition: Goal: Adequate nutrition will be maintained Outcome: Progressing   Problem: Coping: Goal: Level of anxiety will decrease Outcome: Progressing   Problem: Elimination: Goal: Will not experience complications related to bowel motility Outcome: Progressing Goal: Will not experience complications related to urinary retention Outcome: Progressing   Problem: Pain Managment: Goal: General experience of comfort will improve and/or be controlled Outcome: Progressing   Problem: Safety: Goal: Ability to remain free from injury will improve Outcome: Progressing   Problem: Skin Integrity: Goal: Risk for impaired skin integrity will decrease Outcome: Progressing

## 2025-01-14 NOTE — Care Management Important Message (Signed)
 Important Message  Patient Details  Name: [name] MRN: [id number] Date of Birth: 01-04-1956   Important Message Given:  Yes - Medicare IM     Rahaf Carbonell 01/14/2025, 1:55 PM

## 2025-01-14 NOTE — Progress Notes (Signed)
 Mobility Specialist - Progress Note ON O2  Pre-mobility: HR-80,  SpO2-98%  During mobility: HR-90,  SpO2-98%  Post-mobility: HR-84,  SPO2-95%   01/14/25 1726  Mobility  Activity Ambulated with assistance  Level of Assistance Minimal assist, patient does 75% or more  Assistive Device Four wheel walker  Distance Ambulated (ft) 6 ft  Range of Motion/Exercises All extremities  Activity Response Tolerated well  Mobility visit 1 Mobility  Mobility Specialist Start Time (ACUTE ONLY) 1655  Mobility Specialist Stop Time (ACUTE ONLY) 1723  Mobility Specialist Time Calculation (min) (ACUTE ONLY) 28 min   Pt was in chair on O2 upon entry. Pt agreed to attempt to ambulate. Pt was able to STS with CGA and 4WW. Pt was able today to take a few steps and pivot to the bed. Pt vitals were taken throughout activity and remained WNL. After rest break pt returned to the bed with needs in reach and bed alarm on.  Clem Rodes Mobility Specialist 01/14/25, 5:31 PM

## 2025-01-15 ENCOUNTER — Inpatient Hospital Stay

## 2025-01-15 LAB — BASIC METABOLIC PANEL WITH GFR
Anion gap: 8 (ref 5–15)
BUN: 36 mg/dL — ABNORMAL HIGH (ref 8–23)
CO2: 32 mmol/L (ref 22–32)
Calcium: 9.1 mg/dL (ref 8.9–10.3)
Chloride: 97 mmol/L — ABNORMAL LOW (ref 98–111)
Creatinine, Ser: 1.2 mg/dL — ABNORMAL HIGH (ref 0.44–1.00)
GFR, Estimated: 49 mL/min — ABNORMAL LOW
Glucose, Bld: 80 mg/dL (ref 70–99)
Potassium: 3.8 mmol/L (ref 3.5–5.1)
Sodium: 138 mmol/L (ref 135–145)

## 2025-01-15 LAB — BLOOD GAS, VENOUS
Acid-Base Excess: 6.1 mmol/L — ABNORMAL HIGH (ref 0.0–2.0)
Bicarbonate: 34 mmol/L — ABNORMAL HIGH (ref 20.0–28.0)
O2 Saturation: 93.8 %
Patient temperature: 37
pCO2, Ven: 63 mmHg — ABNORMAL HIGH (ref 44–60)
pH, Ven: 7.34 (ref 7.25–7.43)
pO2, Ven: 62 mmHg — ABNORMAL HIGH (ref 32–45)

## 2025-01-15 LAB — CBC
HCT: 29.1 % — ABNORMAL LOW (ref 36.0–46.0)
Hemoglobin: 8.8 g/dL — ABNORMAL LOW (ref 12.0–15.0)
MCH: 28 pg (ref 26.0–34.0)
MCHC: 30.2 g/dL (ref 30.0–36.0)
MCV: 92.7 fL (ref 80.0–100.0)
Platelets: 251 10*3/uL (ref 150–400)
RBC: 3.14 MIL/uL — ABNORMAL LOW (ref 3.87–5.11)
RDW: 15.5 % (ref 11.5–15.5)
WBC: 8.8 10*3/uL (ref 4.0–10.5)
nRBC: 0 % (ref 0.0–0.2)

## 2025-01-15 LAB — MAGNESIUM: Magnesium: 1.8 mg/dL (ref 1.7–2.4)

## 2025-01-15 LAB — AMMONIA: Ammonia: 33 umol/L (ref 9–35)

## 2025-01-15 MED ORDER — GABAPENTIN 400 MG PO CAPS: 400.0000 mg | ORAL_CAPSULE | Freq: Three times a day (TID) | ORAL | Status: DC

## 2025-01-15 NOTE — Plan of Care (Signed)
 Pt currently on 7L nasal cannula. Pt wore bipap at night time, O2 dropped through then night. Mask not sealing properly, readjusted multiple times. Dressing to buttocks/thighs changed. Pt noted scratching causing bleeding. Pt received prn atarax  for itching. Pt c/o increased pain to right knee, x 1 order received for oxycodone  for pain. Pain medication effective. Purewick changed, full linen change.    Problem: Education: Goal: Knowledge of General Education information will improve Description: Including pain rating scale, medication(s)/side effects and non-pharmacologic comfort measures Outcome: Progressing   Problem: Health Behavior/Discharge Planning: Goal: Ability to manage health-related needs will improve Outcome: Progressing   Problem: Clinical Measurements: Goal: Ability to maintain clinical measurements within normal limits will improve Outcome: Progressing Goal: Will remain free from infection Outcome: Progressing Goal: Diagnostic test results will improve Outcome: Progressing Goal: Respiratory complications will improve Outcome: Progressing Goal: Cardiovascular complication will be avoided Outcome: Progressing   Problem: Activity: Goal: Risk for activity intolerance will decrease Outcome: Progressing   Problem: Nutrition: Goal: Adequate nutrition will be maintained Outcome: Progressing   Problem: Activity: Goal: Risk for activity intolerance will decrease Outcome: Progressing   Problem: Nutrition: Goal: Adequate nutrition will be maintained Outcome: Progressing   Problem: Coping: Goal: Level of anxiety will decrease Outcome: Progressing   Problem: Elimination: Goal: Will not experience complications related to bowel motility Outcome: Progressing Goal: Will not experience complications related to urinary retention Outcome: Progressing   Problem: Coping: Goal: Level of anxiety will decrease Outcome: Progressing   Problem: Elimination: Goal: Will not  experience complications related to bowel motility Outcome: Progressing Goal: Will not experience complications related to urinary retention Outcome: Progressing   Problem: Pain Managment: Goal: General experience of comfort will improve and/or be controlled Outcome: Progressing   Problem: Safety: Goal: Ability to remain free from injury will improve Outcome: Progressing   Problem: Skin Integrity: Goal: Risk for impaired skin integrity will decrease Outcome: Progressing

## 2025-01-15 NOTE — Progress Notes (Addendum)
 "    Progress Note    [name]  FMW:[id number] DOB: 1956/06/09  DOA: 01/11/2025 PCP: [facility]      Brief Narrative:    Medical records reviewed and are as summarized below:  [name] is a 69 y.o. female with medical history significant of chronic hypoxic hypercapnic respiratory failure on 6 L and BiPAP at home, HTN, HLD, CHF, paroxysmal A-fib presented to the ED on 01/11/2025 from [facility] due to progressively worsening shortness of breath and general weakness for about 2 weeks duration.  She also reported a fall from her wheelchair about 2 weeks prior to admission.  Since the fall, she has had pain in the left side of the hip and groin.      Assessment/Plan:   Principal Problem:   Acute on chronic heart failure (HCC)    Body mass index is 39.52 kg/m.  (Class II obesity)    Acute on chronic HFpEF: Creatinine is stable.  Continue IV Lasix  -> will change to p.o. Lasix  tomorrow.  Monitor BMP, daily weight and urine output. Echo in June 2025 showed EF estimated 55 to 60%, grade 2 diastolic dysfunction, moderate MR.   Acute on chronic hypoxic respiratory failure: Oxygen weaned down from 11 L to 8 L/min.  It appears she was weaned down to 6 L but she is back on 8 L oxygen.  She uses 6 L oxygen at baseline.   Continue BiPAP at night.    Elevated troponin: Troponin 66, 62.  This is likely due to demand ischemia.   Left hip and thigh pain, left thigh subcutaneous hematoma: CT left hip did not show any fracture or dislocation.  Venous duplex of the lower extremities showed complex fluid collection in the left thigh suspicious for subcutaneous hematoma   Acute on chronic anemia: H&H stable at 9.3.  S/p transfusion with 1 unit of PRBCs on 01/12/2025 for hemoglobin of 7.7.     Mild leukocytosis: Improved.  Procalcitonin negative.  No evidence of infection thus far.   COPD: Stable.  Continue bronchodilators   Paroxysmal atrial  fibrillation: Continue Cardizem  and Xarelto .   Lethargy: Patient got oxycodone  last night, also reports she did not sleep well.  AO x 3, answers questions appropriately, had lunch, but very drowsy VBG with pCO2 62 (chronic hypercapnia due to COPD), pending UA, CXR, ammonia Hold gabapentin , Zoloft    Comorbidities include rheumatoid arthritis on low-dose prednisone  and Plaquenil , hypothyroidism, history of PE on Xarelto , OSA on CPAP at night, GERD, hyperlipidemia, CKD stage IIIa versus CKD stage IIIb (fluctuating GFR so difficult to determine exact stage)   Diet Order             Diet 2 gram sodium Room service appropriate? Yes; Fluid consistency: Thin; Fluid restriction: 1500 mL Fluid  Diet effective now                   Consultants: None  Procedures: None    Medications:    sodium chloride    Intravenous Once   atorvastatin   10 mg Oral QHS   budesonide   0.5 mg Nebulization BID   budesonide -glycopyrrolate -formoterol   2 puff Inhalation BID   diltiazem   120 mg Oral Daily   furosemide   40 mg Intravenous BID   gabapentin   400 mg Oral TID   hydroxychloroquine   200 mg Oral Daily   levothyroxine   175 mcg Oral Daily   lidocaine   1 patch Transdermal Q24H   loratadine   10 mg Oral  Daily   melatonin  5 mg Oral QHS   montelukast   10 mg Oral Daily   pantoprazole   40 mg Oral Daily   predniSONE   5 mg Oral Q breakfast   rivaroxaban   20 mg Oral Daily   sertraline   75 mg Oral Daily   Continuous Infusions:   Anti-infectives (From admission, onward)    Start     Dose/Rate Route Frequency Ordered Stop   01/12/25 1000  hydroxychloroquine  (PLAQUENIL ) tablet 200 mg        200 mg Oral Daily 01/11/25 1712           Family Communication/Anticipated D/C date and plan/Code Status   DVT prophylaxis:  rivaroxaban  (XARELTO ) tablet 20 mg     Code Status: Full Code  Family Communication: None Disposition Plan: Plan to discharge to the nursing home   Status is:  Inpatient Remains inpatient appropriate because: CHF exacerbation and hypoxic respiratory failure       Subjective:   Interval events noted.  Patient was examined at bedside, new to me today.  Seen earlier on admission Patient answering questions appropriately, reports being very sleepy and drowsy. Unknown if secondary to oxycodone  she received last night, also reports she did not sleep well overnight Workup pending  Objective:    Vitals:   01/15/25 0437 01/15/25 0500 01/15/25 0808 01/15/25 0829  BP: 127/65   104/65  Pulse: 77   79  Resp: 16     Temp: (!) 97.2 F (36.2 C)   97.8 F (36.6 C)  TempSrc:    Oral  SpO2: 93%  99% 95%  Weight:  98 kg    Height:       No data found.   Intake/Output Summary (Last 24 hours) at 01/15/2025 0918 Last data filed at 01/15/2025 0547 Gross per 24 hour  Intake 600 ml  Output 3200 ml  Net -2600 ml   Filed Weights   01/13/25 0500 01/14/25 0500 01/15/25 0500  Weight: 92.1 kg 94.4 kg 98 kg    Exam:  GEN: NAD SKIN: Warm and dry EYES: No pallor or icterus ENT: MMM CV: RRR PULM: Bibasilar rales.  No rhonchi or wheezing ABD: soft, ND, NT, +BS CNS: AAO x 3, non focal, drowsy EXT: No edema.  Tender indurated area on the left anterior proximal thigh     Data Reviewed:   I have personally reviewed following labs and imaging studies:  Labs: Labs show the following:   Basic Metabolic Panel: Recent Labs  Lab 01/11/25 1330 01/12/25 0513 01/13/25 0445 01/14/25 0330 01/15/25 0443  NA 133* 136 140 137 138  K 4.2 3.6 3.1* 4.9 3.8  CL 91* 94* 98 97* 97*  CO2 27 30 33* 34* 32  GLUCOSE 85 81 79 101* 80  BUN 33* 32* 26* 32* 36*  CREATININE 1.35* 1.29* 1.18* 1.30* 1.20*  CALCIUM  9.0 8.6* 8.8* 9.0 9.1  MG 2.3  --  2.1 1.9 1.8  PHOS  --   --  3.2 3.1  --    GFR Estimated Creatinine Clearance: 49.8 mL/min (A) (by C-G formula based on SCr of 1.2 mg/dL (H)). Liver Function Tests: Recent Labs  Lab 01/11/25 1330 01/13/25 0445  01/14/25 0330  AST 23  --   --   ALT 11  --   --   ALKPHOS 99  --   --   BILITOT 1.4*  --   --   PROT 7.3  --   --   ALBUMIN  3.3* 3.2*  3.6   Recent Labs  Lab 01/11/25 1330  LIPASE 15   No results for input(s): AMMONIA in the last 168 hours. Coagulation profile No results for input(s): INR, PROTIME in the last 168 hours.  CBC: Recent Labs  Lab 01/11/25 1330 01/12/25 0513 01/13/25 0445 01/14/25 0330 01/15/25 0443  WBC 10.6* 7.1 7.9 9.7 8.8  NEUTROABS 8.4*  --   --   --   --   HGB 8.8* 7.7* 9.4* 9.3* 8.8*  HCT 29.9* 25.9* 31.5* 31.2* 29.1*  MCV 93.4 91.5 92.6 94.3 92.7  PLT 270 223 256 263 251   Cardiac Enzymes: No results for input(s): CKTOTAL, CKMB, CKMBINDEX, TROPONINI in the last 168 hours. BNP (last 3 results) Recent Labs    11/18/24 1330 01/11/25 1330  PROBNP 284.0 3,962.0*   CBG: No results for input(s): GLUCAP in the last 168 hours. D-Dimer: No results for input(s): DDIMER in the last 72 hours. Hgb A1c: No results for input(s): HGBA1C in the last 72 hours. Lipid Profile: No results for input(s): CHOL, HDL, LDLCALC, TRIG, CHOLHDL, LDLDIRECT in the last 72 hours. Thyroid  function studies: No results for input(s): TSH, T4TOTAL, T3FREE, THYROIDAB in the last 72 hours.  Invalid input(s): FREET3 Anemia work up: No results for input(s): VITAMINB12, FOLATE, FERRITIN, TIBC, IRON, RETICCTPCT in the last 72 hours. Sepsis Labs: Recent Labs  Lab 01/12/25 0134 01/12/25 0513 01/13/25 0445 01/14/25 0330 01/15/25 0443  PROCALCITON 0.26  --   --   --   --   WBC  --  7.1 7.9 9.7 8.8    Microbiology Recent Results (from the past 240 hours)  Resp panel by RT-PCR (RSV, Flu A&B, Covid) Anterior Nasal Swab     Status: None   Collection Time: 01/11/25  1:24 PM   Specimen: Anterior Nasal Swab  Result Value Ref Range Status   SARS Coronavirus 2 by RT PCR NEGATIVE NEGATIVE Final    Comment: (NOTE) SARS-CoV-2  target nucleic acids are NOT DETECTED.  The SARS-CoV-2 RNA is generally detectable in upper respiratory specimens during the acute phase of infection. The lowest concentration of SARS-CoV-2 viral copies this assay can detect is 138 copies/mL. A negative result does not preclude SARS-Cov-2 infection and should not be used as the sole basis for treatment or other patient management decisions. A negative result may occur with  improper specimen collection/handling, submission of specimen other than nasopharyngeal swab, presence of viral mutation(s) within the areas targeted by this assay, and inadequate number of viral copies(<138 copies/mL). A negative result must be combined with clinical observations, patient history, and epidemiological information. The expected result is Negative.  Fact Sheet for Patients:  bloggercourse.com  Fact Sheet for Healthcare Providers:  seriousbroker.it  This test is no t yet approved or cleared by the [place]  FDA and  has been authorized for detection and/or diagnosis of SARS-CoV-2 by FDA under an Emergency Use Authorization (EUA). This EUA will remain  in effect (meaning this test can be used) for the duration of the COVID-19 declaration under Section 564(b)(1) of the Act, 21 U.S.C.section 360bbb-3(b)(1), unless the authorization is terminated  or revoked sooner.       Influenza A by PCR NEGATIVE NEGATIVE Final   Influenza B by PCR NEGATIVE NEGATIVE Final    Comment: (NOTE) The Xpert Xpress SARS-CoV-2/FLU/RSV plus assay is intended as an aid in the diagnosis of influenza from Nasopharyngeal swab specimens and should not be used as a sole basis for treatment. Nasal washings and aspirates are  unacceptable for Xpert Xpress SARS-CoV-2/FLU/RSV testing.  Fact Sheet for Patients: bloggercourse.com  Fact Sheet for Healthcare  Providers: seriousbroker.it  This test is not yet approved or cleared by the [place]  FDA and has been authorized for detection and/or diagnosis of SARS-CoV-2 by FDA under an Emergency Use Authorization (EUA). This EUA will remain in effect (meaning this test can be used) for the duration of the COVID-19 declaration under Section 564(b)(1) of the Act, 21 U.S.C. section 360bbb-3(b)(1), unless the authorization is terminated or revoked.     Resp Syncytial Virus by PCR NEGATIVE NEGATIVE Final    Comment: (NOTE) Fact Sheet for Patients: bloggercourse.com  Fact Sheet for Healthcare Providers: seriousbroker.it  This test is not yet approved or cleared by the [place]  FDA and has been authorized for detection and/or diagnosis of SARS-CoV-2 by FDA under an Emergency Use Authorization (EUA). This EUA will remain in effect (meaning this test can be used) for the duration of the COVID-19 declaration under Section 564(b)(1) of the Act, 21 U.S.C. section 360bbb-3(b)(1), unless the authorization is terminated or revoked.  Performed at [facility], [address] [place], [place] [address]     Procedures and diagnostic studies:  No results found.     LOS: 4 days   Total time spent: 52 minutes  Tasia Liz Counselling Psychologist on [email or web address]. If 7PM-7AM, please contact night-coverage at [email or web address]     01/15/2025, 9:18 AM           "

## 2025-01-16 LAB — BASIC METABOLIC PANEL WITH GFR
Anion gap: 9 (ref 5–15)
BUN: 32 mg/dL — ABNORMAL HIGH (ref 8–23)
CO2: 33 mmol/L — ABNORMAL HIGH (ref 22–32)
Calcium: 9.3 mg/dL (ref 8.9–10.3)
Chloride: 97 mmol/L — ABNORMAL LOW (ref 98–111)
Creatinine, Ser: 1.05 mg/dL — ABNORMAL HIGH (ref 0.44–1.00)
GFR, Estimated: 58 mL/min — ABNORMAL LOW
Glucose, Bld: 121 mg/dL — ABNORMAL HIGH (ref 70–99)
Potassium: 3.6 mmol/L (ref 3.5–5.1)
Sodium: 138 mmol/L (ref 135–145)

## 2025-01-16 LAB — CBC
HCT: 31.5 % — ABNORMAL LOW (ref 36.0–46.0)
Hemoglobin: 9.3 g/dL — ABNORMAL LOW (ref 12.0–15.0)
MCH: 27.8 pg (ref 26.0–34.0)
MCHC: 29.5 g/dL — ABNORMAL LOW (ref 30.0–36.0)
MCV: 94 fL (ref 80.0–100.0)
Platelets: 267 10*3/uL (ref 150–400)
RBC: 3.35 MIL/uL — ABNORMAL LOW (ref 3.87–5.11)
RDW: 15.2 % (ref 11.5–15.5)
WBC: 9.3 10*3/uL (ref 4.0–10.5)
nRBC: 0 % (ref 0.0–0.2)

## 2025-01-16 MED ORDER — FUROSEMIDE 40 MG PO TABS
40.0000 mg | ORAL_TABLET | Freq: Two times a day (BID) | ORAL | Status: DC
  Administered 2025-01-16 – 2025-01-17 (×4): 40 mg via ORAL
  Filled 2025-01-16 (×4): qty 1

## 2025-01-16 MED ORDER — GABAPENTIN 400 MG PO CAPS
400.0000 mg | ORAL_CAPSULE | Freq: Three times a day (TID) | ORAL | Status: DC
  Administered 2025-01-16 – 2025-01-17 (×3): 400 mg via ORAL
  Filled 2025-01-16 (×3): qty 1

## 2025-01-16 NOTE — Plan of Care (Signed)

## 2025-01-16 NOTE — Plan of Care (Signed)

## 2025-01-16 NOTE — Plan of Care (Signed)
" °  Problem: Education: Goal: Knowledge of General Education information will improve Description: Including pain rating scale, medication(s)/side effects and non-pharmacologic comfort measures 01/16/2025 1932 by Rollene Jon HERO, RN Outcome: Progressing 01/16/2025 1924 by Rollene Jon HERO, RN Outcome: Progressing   Problem: Health Behavior/Discharge Planning: Goal: Ability to manage health-related needs will improve 01/16/2025 1932 by Rollene Jon HERO, RN Outcome: Progressing 01/16/2025 1924 by Rollene Jon HERO, RN Outcome: Progressing   Problem: Clinical Measurements: Goal: Ability to maintain clinical measurements within normal limits will improve 01/16/2025 1932 by Rollene Jon HERO, RN Outcome: Progressing 01/16/2025 1924 by Rollene Jon HERO, RN Outcome: Progressing   Problem: Clinical Measurements: Goal: Will remain free from infection 01/16/2025 1932 by Rollene Jon HERO, RN Outcome: Progressing 01/16/2025 1924 by Rollene Jon HERO, RN Outcome: Progressing   Problem: Clinical Measurements: Goal: Diagnostic test results will improve 01/16/2025 1932 by Rollene Jon HERO, RN Outcome: Progressing 01/16/2025 1924 by Rollene Jon HERO, RN Outcome: Progressing   Problem: Activity: Goal: Risk for activity intolerance will decrease 01/16/2025 1932 by Rollene Jon HERO, RN Outcome: Progressing 01/16/2025 1924 by Rollene Jon HERO, RN Outcome: Progressing   Problem: Clinical Measurements: Goal: Cardiovascular complication will be avoided 01/16/2025 1932 by Rollene Jon HERO, RN Outcome: Progressing   "

## 2025-01-17 DIAGNOSIS — I5033 Acute on chronic diastolic (congestive) heart failure: Secondary | ICD-10-CM | POA: Diagnosis not present

## 2025-01-17 MED ORDER — TRAMADOL HCL 50 MG PO TABS: 50.0000 mg | ORAL_TABLET | Freq: Three times a day (TID) | ORAL | 0 refills | Status: AC | PRN

## 2025-01-17 NOTE — TOC Transition Note (Signed)
 Transition of Care [facility]) - Discharge Note   Patient Details  Name: [name] MRN: [id number] Date of Birth: [birth year]  [facility] CM/SW Contact:  Shasta DELENA Daring, RN Phone Number: 01/17/2025, 12:25 PM   Clinical Narrative:     Patient has discharge orders and summary, both sent to [facility] via hub.  Notified daughter-in-law Delon of discharge and transport  and answered her questions.  Transportation arranged via [facility]. Patient is #5 on list.    Bedside RN notified of [facility] room number (101 a) and report number.  No additional TOC needs.  RNCM signing off.   Final next level of care: Skilled Nursing Facility Barriers to Discharge: Barriers Resolved   Patient Goals and CMS Choice            Discharge Placement              Patient chooses bed at: [facility] [place] Patient to be transferred to facility by: [facility] Name of family member notified: Delon, daughter-in-law Patient and family notified of of transfer: 01/17/25  Discharge Plan and Services Additional resources added to the After Visit Summary for                                       Social Drivers of Health (SDOH) Interventions SDOH Screenings   Food Insecurity: No Food Insecurity (01/12/2025)  Housing: Low Risk (01/12/2025)  Transportation Needs: No Transportation Needs (01/12/2025)  Utilities: Not At Risk (01/12/2025)  Financial Resource Strain: High Risk (05/24/2024)  Social Connections: Socially Isolated (01/12/2025)  Tobacco Use: Medium Risk (01/12/2025)     Readmission Risk Interventions    01/13/2025    5:03 PM  Readmission Risk Prevention Plan  Transportation Screening Complete  Medication Review (RN Care Manager) Complete  PCP or Specialist appointment within 3-5 days of discharge --  HRI or Home Care Consult Complete  Palliative Care Screening Not Applicable  Skilled Nursing Facility Complete

## 2025-01-20 ENCOUNTER — Telehealth: Payer: Self-pay | Admitting: Adult Health

## 2025-01-20 NOTE — Telephone Encounter (Signed)
 Called to confirm/remind patient of [facility] appointment [facility] on [facility].   Appointment:   [x] Confirmed  [] Left mess   [] No answer/No voice mail  [] VM Full/unable to leave message  [] Phone not in service  Patient reminded to bring all medications and/or complete list.  Confirmed patient has transportation. Gave directions, instructed to utilize valet parking.

## 2025-01-21 ENCOUNTER — Ambulatory Visit: Admitting: Adult Health

## 2025-02-04 ENCOUNTER — Ambulatory Visit: Admitting: Family
